# Patient Record
Sex: Female | Born: 1940
Health system: Southern US, Community
[De-identification: ages and names within clinical notes are randomized; demographics above are authoritative.]

## PROBLEM LIST (undated history)

## (undated) ENCOUNTER — Emergency Department (HOSPITAL_BASED_OUTPATIENT_CLINIC_OR_DEPARTMENT_OTHER): Admission: EM | Source: Home / Self Care

## (undated) DIAGNOSIS — I700 Atherosclerosis of aorta: Secondary | ICD-10-CM

## (undated) DIAGNOSIS — Z973 Presence of spectacles and contact lenses: Secondary | ICD-10-CM

## (undated) DIAGNOSIS — R350 Frequency of micturition: Secondary | ICD-10-CM

## (undated) DIAGNOSIS — R17 Unspecified jaundice: Secondary | ICD-10-CM

## (undated) DIAGNOSIS — C50112 Malignant neoplasm of central portion of left female breast: Secondary | ICD-10-CM

## (undated) DIAGNOSIS — E119 Type 2 diabetes mellitus without complications: Secondary | ICD-10-CM

## (undated) DIAGNOSIS — R011 Cardiac murmur, unspecified: Secondary | ICD-10-CM

## (undated) DIAGNOSIS — R531 Weakness: Secondary | ICD-10-CM

## (undated) DIAGNOSIS — Z5189 Encounter for other specified aftercare: Secondary | ICD-10-CM

## (undated) DIAGNOSIS — Z8679 Personal history of other diseases of the circulatory system: Secondary | ICD-10-CM

## (undated) DIAGNOSIS — Z170 Estrogen receptor positive status [ER+]: Secondary | ICD-10-CM

## (undated) DIAGNOSIS — Z972 Presence of dental prosthetic device (complete) (partial): Secondary | ICD-10-CM

## (undated) DIAGNOSIS — M8580 Other specified disorders of bone density and structure, unspecified site: Secondary | ICD-10-CM

## (undated) DIAGNOSIS — Z9081 Acquired absence of spleen: Secondary | ICD-10-CM

## (undated) DIAGNOSIS — N183 Chronic kidney disease, stage 3 unspecified: Secondary | ICD-10-CM

## (undated) DIAGNOSIS — Z923 Personal history of irradiation: Secondary | ICD-10-CM

## (undated) DIAGNOSIS — I519 Heart disease, unspecified: Secondary | ICD-10-CM

## (undated) DIAGNOSIS — R319 Hematuria, unspecified: Secondary | ICD-10-CM

## (undated) DIAGNOSIS — Z800 Family history of malignant neoplasm of digestive organs: Secondary | ICD-10-CM

## (undated) DIAGNOSIS — H269 Unspecified cataract: Secondary | ICD-10-CM

## (undated) DIAGNOSIS — I671 Cerebral aneurysm, nonruptured: Secondary | ICD-10-CM

## (undated) DIAGNOSIS — Z974 Presence of external hearing-aid: Secondary | ICD-10-CM

## (undated) DIAGNOSIS — D494 Neoplasm of unspecified behavior of bladder: Secondary | ICD-10-CM

## (undated) DIAGNOSIS — I639 Cerebral infarction, unspecified: Secondary | ICD-10-CM

## (undated) DIAGNOSIS — R300 Dysuria: Secondary | ICD-10-CM

## (undated) DIAGNOSIS — C189 Malignant neoplasm of colon, unspecified: Secondary | ICD-10-CM

## (undated) DIAGNOSIS — K219 Gastro-esophageal reflux disease without esophagitis: Secondary | ICD-10-CM

## (undated) DIAGNOSIS — R310 Gross hematuria: Secondary | ICD-10-CM

## (undated) DIAGNOSIS — E785 Hyperlipidemia, unspecified: Secondary | ICD-10-CM

## (undated) DIAGNOSIS — Z803 Family history of malignant neoplasm of breast: Secondary | ICD-10-CM

## (undated) DIAGNOSIS — C50919 Malignant neoplasm of unspecified site of unspecified female breast: Secondary | ICD-10-CM

## (undated) DIAGNOSIS — Z85820 Personal history of malignant melanoma of skin: Secondary | ICD-10-CM

## (undated) DIAGNOSIS — Z8489 Family history of other specified conditions: Secondary | ICD-10-CM

## (undated) DIAGNOSIS — K5730 Diverticulosis of large intestine without perforation or abscess without bleeding: Secondary | ICD-10-CM

## (undated) DIAGNOSIS — D649 Anemia, unspecified: Secondary | ICD-10-CM

## (undated) DIAGNOSIS — Z8051 Family history of malignant neoplasm of kidney: Secondary | ICD-10-CM

## (undated) DIAGNOSIS — C679 Malignant neoplasm of bladder, unspecified: Secondary | ICD-10-CM

## (undated) DIAGNOSIS — T7840XA Allergy, unspecified, initial encounter: Secondary | ICD-10-CM

## (undated) DIAGNOSIS — Z8673 Personal history of transient ischemic attack (TIA), and cerebral infarction without residual deficits: Secondary | ICD-10-CM

## (undated) DIAGNOSIS — R0600 Dyspnea, unspecified: Secondary | ICD-10-CM

## (undated) DIAGNOSIS — M1990 Unspecified osteoarthritis, unspecified site: Secondary | ICD-10-CM

## (undated) DIAGNOSIS — I10 Essential (primary) hypertension: Secondary | ICD-10-CM

## (undated) DIAGNOSIS — R197 Diarrhea, unspecified: Secondary | ICD-10-CM

## (undated) DIAGNOSIS — Z8049 Family history of malignant neoplasm of other genital organs: Secondary | ICD-10-CM

## (undated) DIAGNOSIS — C439 Malignant melanoma of skin, unspecified: Secondary | ICD-10-CM

## (undated) DIAGNOSIS — R5383 Other fatigue: Secondary | ICD-10-CM

## (undated) HISTORY — PX: CHOLECYSTECTOMY OPEN: SUR202

## (undated) HISTORY — PX: FINGER SURGERY: SHX640

## (undated) HISTORY — PX: TONSILLECTOMY: SUR1361

## (undated) HISTORY — DX: Family history of malignant neoplasm of breast: Z80.3

## (undated) HISTORY — DX: Anemia, unspecified: D64.9

## (undated) HISTORY — DX: Allergy, unspecified, initial encounter: T78.40XA

## (undated) HISTORY — DX: Family history of malignant neoplasm of digestive organs: Z80.0

## (undated) HISTORY — DX: Cerebral infarction, unspecified: I63.9

## (undated) HISTORY — DX: Encounter for other specified aftercare: Z51.89

## (undated) HISTORY — DX: Family history of malignant neoplasm of other genital organs: Z80.49

## (undated) HISTORY — DX: Unspecified cataract: H26.9

## (undated) HISTORY — DX: Hyperlipidemia, unspecified: E78.5

## (undated) HISTORY — PX: SPLENECTOMY, TOTAL: SHX788

## (undated) HISTORY — DX: Family history of malignant neoplasm of kidney: Z80.51

---
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
## 2001-11-09 ENCOUNTER — Inpatient Hospital Stay (HOSPITAL_COMMUNITY): Admission: EM | Admit: 2001-11-09 | Discharge: 2001-11-10 | Payer: Self-pay | Admitting: Emergency Medicine

## 2005-02-12 ENCOUNTER — Inpatient Hospital Stay (HOSPITAL_COMMUNITY): Admission: EM | Admit: 2005-02-12 | Discharge: 2005-02-19 | Payer: Self-pay | Admitting: Emergency Medicine

## 2005-02-12 DIAGNOSIS — Z8679 Personal history of other diseases of the circulatory system: Secondary | ICD-10-CM

## 2005-02-12 HISTORY — DX: Personal history of other diseases of the circulatory system: Z86.79

## 2012-10-16 ENCOUNTER — Emergency Department (HOSPITAL_COMMUNITY)
Admission: EM | Admit: 2012-10-16 | Discharge: 2012-10-16 | Disposition: A | Discharge status: Home / Self Care | Payer: Medicare HMO | Attending: Emergency Medicine | Admitting: Emergency Medicine

## 2012-10-16 ENCOUNTER — Encounter (HOSPITAL_COMMUNITY): Payer: Self-pay | Admitting: Emergency Medicine

## 2012-10-16 ENCOUNTER — Emergency Department (HOSPITAL_COMMUNITY): Payer: Medicare HMO

## 2012-10-16 DIAGNOSIS — Z79899 Other long term (current) drug therapy: Secondary | ICD-10-CM | POA: Insufficient documentation

## 2012-10-16 DIAGNOSIS — Z8679 Personal history of other diseases of the circulatory system: Secondary | ICD-10-CM | POA: Insufficient documentation

## 2012-10-16 DIAGNOSIS — Y92009 Unspecified place in unspecified non-institutional (private) residence as the place of occurrence of the external cause: Secondary | ICD-10-CM | POA: Insufficient documentation

## 2012-10-16 DIAGNOSIS — I10 Essential (primary) hypertension: Secondary | ICD-10-CM | POA: Insufficient documentation

## 2012-10-16 DIAGNOSIS — IMO0002 Reserved for concepts with insufficient information to code with codable children: Secondary | ICD-10-CM | POA: Insufficient documentation

## 2012-10-16 DIAGNOSIS — Y939 Activity, unspecified: Secondary | ICD-10-CM | POA: Insufficient documentation

## 2012-10-16 DIAGNOSIS — M8580 Other specified disorders of bone density and structure, unspecified site: Secondary | ICD-10-CM

## 2012-10-16 DIAGNOSIS — Z7982 Long term (current) use of aspirin: Secondary | ICD-10-CM | POA: Insufficient documentation

## 2012-10-16 DIAGNOSIS — E119 Type 2 diabetes mellitus without complications: Secondary | ICD-10-CM | POA: Insufficient documentation

## 2012-10-16 DIAGNOSIS — X500XXA Overexertion from strenuous movement or load, initial encounter: Secondary | ICD-10-CM | POA: Insufficient documentation

## 2012-10-16 DIAGNOSIS — S76312A Strain of muscle, fascia and tendon of the posterior muscle group at thigh level, left thigh, initial encounter: Secondary | ICD-10-CM

## 2012-10-16 HISTORY — DX: Essential (primary) hypertension: I10

## 2012-10-16 HISTORY — DX: Other specified disorders of bone density and structure, unspecified site: M85.80

## 2012-10-16 HISTORY — DX: Cardiac murmur, unspecified: R01.1

## 2012-10-16 MED ORDER — IBUPROFEN 400 MG PO TABS: 400.0000 mg | ORAL_TABLET | Freq: Four times a day (QID) | ORAL | Status: DC | PRN

## 2012-10-16 NOTE — ED Provider Notes (Signed)
CSN: [id number]     Arrival date & time 10/16/12  1126 History   First MD Initiated Contact with Patient 10/16/12 1215     Chief Complaint  Patient presents with  . Knee Pain   (Consider location/radiation/quality/duration/timing/severity/associated sxs/prior Treatment) HPI Comments: Patient presents with posterior left knee pain that began acutely this morning when she stood up from her toilet, twisted her knee and felt a "pop". Pain was sharp. It is improved with nonweightbearing. It hurts when she puts weight on her heel. She denies numbness or tingling. No treatments prior to arrival. No hip pain. Course is constant.  Patient is a 72 y.o. female presenting with knee pain. The history is provided by the patient.  Knee Pain Associated symptoms: no back pain and no neck pain     Past Medical History  Diagnosis Date  . Hypertension   . Diabetes mellitus without complication   . Heart murmur    History reviewed. No pertinent past surgical history. History reviewed. No pertinent family history. History  Substance Use Topics  . Smoking status: Never Smoker   . Smokeless tobacco: Not on file  . Alcohol Use: No   OB History   Grav Para Term Preterm Abortions TAB SAB Ect Mult Living                 Review of Systems  Constitutional: Negative for activity change.  HENT: Negative for neck pain.   Musculoskeletal: Positive for arthralgias. Negative for back pain and joint swelling.  Skin: Negative for wound.  Neurological: Negative for weakness and numbness.    Allergies  Review of patient's allergies indicates no known allergies.  Home Medications   Current Outpatient Rx  Name  Route  Sig  Dispense  Refill  . aspirin 81 MG tablet   Oral   Take 81 mg by mouth daily.         [name] lisinopril (PRINIVIL,ZESTRIL) 20 MG tablet   Oral   Take 20 mg by mouth daily.         [name] ibuprofen (ADVIL,MOTRIN) 400 MG tablet   Oral   Take 1 tablet (400 mg total) by mouth every 6 (six)  hours as needed for pain.   30 tablet   0    BP 107/84  Pulse 78  Temp(Src) 97.5 F (36.4 C) (Oral)  Resp 18  SpO2 99% Physical Exam  Nursing note and vitals reviewed. Constitutional: She appears well-developed and well-nourished.  HENT:  Head: Normocephalic and atraumatic.  Eyes: Pupils are equal, round, and reactive to light.  Neck: Normal range of motion. Neck supple.  Cardiovascular: Exam reveals no decreased pulses.   Pulses:      Dorsalis pedis pulses are 2+ on the right side, and 2+ on the left side.       Posterior tibial pulses are 2+ on the right side, and 2+ on the left side.  Musculoskeletal: She exhibits tenderness. She exhibits no edema.       Left hip: Normal.       Left knee: She exhibits normal range of motion, no swelling, no effusion, no LCL laxity, normal patellar mobility and no MCL laxity. Tenderness found. No medial joint line, no lateral joint line and no patellar tendon tenderness noted.       Left upper leg: She exhibits tenderness and bony tenderness.       Legs:      Left foot: Normal.  Neurological: She is alert. No sensory deficit.  Motor, sensation, and vascular distal to the injury is fully intact.   Skin: Skin is warm and dry.  Psychiatric: She has a normal mood and affect.    ED Course  Procedures (including critical care time) Labs Review Labs Reviewed - No data to display Imaging Review Dg Knee Complete 4 Views Left  10/16/2012   *RADIOLOGY REPORT*  Clinical Data: Left knee posterior pain no known injury  LEFT KNEE - COMPLETE 4+ VIEW  Comparison:  none  Findings: Four views of the left knee submitted.  There is diffuse osteopenia.  No acute fracture or subluxation.  Narrowing of medial joint compartment.  Small to moderate joint effusion.  Narrowing of patellofemoral joint space.  Mild spurring of patella.  IMPRESSION: No acute fracture or subluxation.  Diffuse osteopenia. Degenerative changes as described above.   Original Report  Authenticated By: Natasha Mead, M.D.   Patient seen and examined. Work-up initiated.    Vital signs reviewed and are as follows: Filed Vitals:   10/16/12 1131  BP: 107/84  Pulse: 78  Temp: 97.5 F (36.4 C)  Resp: 18   X-ray reviewed by myself, discussed with Dr. Ruffin Frederick. Patient provided with knee sleeve.  Urged followup with orthopedist and take over-the-counter ibuprofen as needed for soreness. Patient refuses crutches.    MDM   1. Hamstring strain, left, initial encounter    Patient with posterior knee pain, most tender in area of distal hamstrings. Suspect hamstring strain/pull. X-rays negative. No apparent laxity on exam. Lower extremity is neurovascularly intact.  Renne Crigler, PA-C 10/16/12 [phone]

## 2012-10-16 NOTE — ED Notes (Signed)
Pt c/o left knee pain after feeling "pop" today; pt sts now having pain; pt ambulatory

## 2012-10-16 NOTE — ED Notes (Signed)
Pt states she stood from a sitting position and felt sudden "pop" and "a sharp dagger" in posterior left knee.

## 2012-10-18 NOTE — ED Provider Notes (Signed)
Medical screening examination/treatment/procedure(s) were performed by non-physician practitioner and as supervising physician I was immediately available for consultation/collaboration.   Dwyne Hasegawa David Derrick Tiegs, MD 10/18/12 1712 

## 2012-11-28 ENCOUNTER — Ambulatory Visit (INDEPENDENT_AMBULATORY_CARE_PROVIDER_SITE_OTHER): Payer: Medicare HMO | Admitting: Family Medicine

## 2012-11-28 VITALS — BP 142/90 | HR 82 | Temp 98.1°F | Resp 20 | Ht 64.0 in | Wt 197.2 lb

## 2012-11-28 DIAGNOSIS — M1710 Unilateral primary osteoarthritis, unspecified knee: Secondary | ICD-10-CM

## 2012-11-28 DIAGNOSIS — M25569 Pain in unspecified knee: Secondary | ICD-10-CM

## 2012-11-28 DIAGNOSIS — IMO0002 Reserved for concepts with insufficient information to code with codable children: Secondary | ICD-10-CM

## 2012-11-28 DIAGNOSIS — M25562 Pain in left knee: Secondary | ICD-10-CM

## 2012-11-28 NOTE — Patient Instructions (Signed)
If any increased pain, redness in knee, or fever - return here or emergency room for recheck as your knee was injected today.  Your knee pain appear to be due to either arthritis flare or degenerative meniscus tear, which is reason for trial of injection today. After 2 weeks of relative rest (range of motion and daily activities ok), and ace bandage as needed, recheck to discuss next step. If imporoving - may start some home exercises, if not, can refer you to orthopaedic doctor.  Return to the clinic or go to the nearest emergency room if any of your symptoms worsen or new symptoms occur. Knee Pain The knee is the complex joint between your thigh and your lower leg. It is made up of bones, tendons, ligaments, and cartilage. The bones that make up the knee are:  The femur in the thigh.  The tibia and fibula in the lower leg.  The patella or kneecap riding in the groove on the lower femur. CAUSES  Knee pain is a common complaint with many causes. A few of these causes are:  Injury, such as:  A ruptured ligament or tendon injury.  Torn cartilage.  Medical conditions, such as:  Gout  Arthritis  Infections  Overuse, over training or overdoing a physical activity. Knee pain can be minor or severe. Knee pain can accompany debilitating injury. Minor knee problems often respond well to self-care measures or get well on their own. More serious injuries may need medical intervention or even surgery. SYMPTOMS The knee is complex. Symptoms of knee problems can vary widely. Some of the problems are:  Pain with movement and weight bearing.  Swelling and tenderness.  Buckling of the knee.  Inability to straighten or extend your knee.  Your knee locks and you cannot straighten it.  Warmth and redness with pain and fever.  Deformity or dislocation of the kneecap. DIAGNOSIS  Determining what is wrong may be very straight forward such as when there is an injury. It can also be challenging  because of the complexity of the knee. Tests to make a diagnosis may include:  Your caregiver taking a history and doing a physical exam.  Routine X-rays can be used to rule out other problems. X-rays will not reveal a cartilage tear. Some injuries of the knee can be diagnosed by:  Arthroscopy a surgical technique by which a small video camera is inserted through tiny incisions on the sides of the knee. This procedure is used to examine and repair internal knee joint problems. Tiny instruments can be used during arthroscopy to repair the torn knee cartilage (meniscus).  Arthrography is a radiology technique. A contrast liquid is directly injected into the knee joint. Internal structures of the knee joint then become visible on X-ray film.  An MRI scan is a non x-ray radiology procedure in which magnetic fields and a computer produce two- or three-dimensional images of the inside of the knee. Cartilage tears are often visible using an MRI scanner. MRI scans have largely replaced arthrography in diagnosing cartilage tears of the knee.  Blood work.  Examination of the fluid that helps to lubricate the knee joint (synovial fluid). This is done by taking a sample out using a needle and a syringe. TREATMENT The treatment of knee problems depends on the cause. Some of these treatments are:  Depending on the injury, proper casting, splinting, surgery or physical therapy care will be needed.  Give yourself adequate recovery time. Do not overuse your joints. If you  begin to get sore during workout routines, back off. Slow down or do fewer repetitions.  For repetitive activities such as cycling or running, maintain your strength and nutrition.  Alternate muscle groups. For example if you are a weight lifter, work the upper body on one day and the lower body the next.  Either tight or weak muscles do not give the proper support for your knee. Tight or weak muscles do not absorb the stress placed on the  knee joint. Keep the muscles surrounding the knee strong.  Take care of mechanical problems.  If you have flat feet, orthotics or special shoes may help. See your caregiver if you need help.  Arch supports, sometimes with wedges on the inner or outer aspect of the heel, can help. These can shift pressure away from the side of the knee most bothered by osteoarthritis.  A brace called an "unloader" brace also may be used to help ease the pressure on the most arthritic side of the knee.  If your caregiver has prescribed crutches, braces, wraps or ice, use as directed. The acronym for this is PRICE. This means protection, rest, ice, compression and elevation.  Nonsteroidal anti-inflammatory drugs (NSAID's), can help relieve pain. But if taken immediately after an injury, they may actually increase swelling. Take NSAID's with food in your stomach. Stop them if you develop stomach problems. Do not take these if you have a history of ulcers, stomach pain or bleeding from the bowel. Do not take without your caregiver's approval if you have problems with fluid retention, heart failure, or kidney problems.  For ongoing knee problems, physical therapy may be helpful.  Glucosamine and chondroitin are over-the-counter dietary supplements. Both may help relieve the pain of osteoarthritis in the knee. These medicines are different from the usual anti-inflammatory drugs. Glucosamine may decrease the rate of cartilage destruction.  Injections of a corticosteroid drug into your knee joint may help reduce the symptoms of an arthritis flare-up. They may provide pain relief that lasts a few months. You may have to wait a few months between injections. The injections do have a small increased risk of infection, water retention and elevated blood sugar levels.  Hyaluronic acid injected into damaged joints may ease pain and provide lubrication. These injections may work by reducing inflammation. A series of shots may  give relief for as long as 6 months.  Topical painkillers. Applying certain ointments to your skin may help relieve the pain and stiffness of osteoarthritis. Ask your pharmacist for suggestions. Many over the-counter products are approved for temporary relief of arthritis pain.  In some countries, doctors often prescribe topical NSAID's for relief of chronic conditions such as arthritis and tendinitis. A review of treatment with NSAID creams found that they worked as well as oral medications but without the serious side effects. PREVENTION  Maintain a healthy weight. Extra pounds put more strain on your joints.  Get strong, stay limber. Weak muscles are a common cause of knee injuries. Stretching is important. Include flexibility exercises in your workouts.  Be smart about exercise. If you have osteoarthritis, chronic knee pain or recurring injuries, you may need to change the way you exercise. This does not mean you have to stop being active. If your knees ache after jogging or playing basketball, consider switching to swimming, water aerobics or other low-impact activities, at least for a few days a week. Sometimes limiting high-impact activities will provide relief.  Make sure your shoes fit well. Choose footwear that is  right for your sport.  Protect your knees. Use the proper gear for knee-sensitive activities. Use kneepads when playing volleyball or laying carpet. Buckle your seat belt every time you drive. Most shattered kneecaps occur in car accidents.  Rest when you are tired. SEEK MEDICAL CARE IF:  You have knee pain that is continual and does not seem to be getting better.  SEEK IMMEDIATE MEDICAL CARE IF:  Your knee joint feels hot to the touch and you have a high fever. MAKE SURE YOU:   Understand these instructions.  Will watch your condition.  Will get help right away if you are not doing well or get worse. Document Released: 11/21/2006 Document Revised: 04/18/2011 Document  Reviewed: 11/21/2006 [facility] Patient Information 2014 [place], [place].

## 2012-11-28 NOTE — Progress Notes (Addendum)
Subjective:    Patient ID: Briana Ochoa, female    DOB: [birth year], 72 y.o.   MRN: [id number]  This chart was scribed for Briana Staggers, MD by Greggory Stallion, ED Scribe. This patient's care was started at 3:14 PM.  HPI HPI Comments: Briana Ochoa is a 72 y.o. female seen 10/16/12 in the ED for left knee pain, acute onset that morning when she twisted her knee and felt a pop. Xray did not indicate acute fracture but did note osteopenia and degenerative changes. Suspects hamstring strain. Recommended treatment of OTC ibuprofen and follow up with ortho. Here for evaluation of left knee again. Pt states she did not see an orthopedist because her insurance wouldn't let her use the referral from the hospital. She states her knee started getting better so she never called. Her knee is still bothering her but now on the front of the knee. She does not use any assisting devices for ambulation but ambulation causes swelling in the knee. Pt denies calf pain, CP, SOB. She denies history of DVT. She takes ibuprofen as needed during the day but does take it every night before bed. Pt states her knee never buckles while she's bearing weight. She denies history of surgery or procedure on her knee.   There are no active problems to display for this patient.  Past Medical History  Diagnosis Date  . Hypertension   . Diabetes mellitus without complication   . Heart murmur    Past Surgical History  Procedure Laterality Date  . Cesarean section    . Cholecystectomy    . Splenectomy, total     No Known Allergies Prior to Admission medications   Medication Sig Start Date End Date Taking? Authorizing Provider  aspirin 81 MG tablet Take 81 mg by mouth daily.   Yes Historical Provider, MD  ibuprofen (ADVIL,MOTRIN) 400 MG tablet Take 1 tablet (400 mg total) by mouth every 6 (six) hours as needed for pain. 10/16/12  Yes Briana Crigler, PA-C  lisinopril (PRINIVIL,ZESTRIL) 20 MG tablet Take 20 mg by mouth  daily.   Yes Historical Provider, MD   History   Social History  . Marital Status: Married    Spouse Name: N/A    Number of Children: N/A  . Years of Education: N/A   Occupational History  . Not on file.   Social History Main Topics  . Smoking status: Never Smoker   . Smokeless tobacco: Not on file  . Alcohol Use: No  . Drug Use: No  . Sexual Activity: Not on file   Other Topics Concern  . Not on file   Social History Narrative  . No narrative on file   Review of Systems  Respiratory: Negative for shortness of breath.   Cardiovascular: Negative for chest pain.  Musculoskeletal: Positive for arthralgias and joint swelling. Negative for myalgias.      Objective:   Physical Exam  Constitutional: She is oriented to person, place, and time. She appears well-developed and well-nourished.  HENT:  Head: Normocephalic and atraumatic.  Eyes: EOM are normal.  Cardiovascular: Normal rate.   Pulmonary/Chest: Effort normal.  Musculoskeletal:  Left knee has full extension. Full flexion equal to right side. Patella and patella tendon are non tender. LCL and lateral joint line non tender. Slight fullness of popliteal fossa but non tender. Hamstrings non tender, no defect. MCL non tender, however is tender to palpation along medial joint line diffusely. No varus or valgus laxity. Negative anterior drawer. 2+  effusion of left knee. No apparent erythema or warmth. Pain with McMurray's test with internal rotation of lower leg and flexion.   Neurological: She is alert and oriented to person, place, and time.  Skin: Skin is warm and dry. No erythema.  Psychiatric: She has a normal mood and affect. Her behavior is normal.   Filed Vitals:   11/28/12 1448  BP: 142/90  Pulse: 82  Temp: 98.1 F (36.7 C)  TempSrc: Oral  Resp: 20  Height: 5\' 4"  (1.626 m)  Weight: 197 lb 3.2 oz (89.449 kg)  SpO2: 96%   Risks (including but not limited to bleeding and infection), benefits, and alternatives  discussed for L knee superolateral injection.  Verbal consent obtained after any questions were answered. Landmarks noted, and marked as needed. Area cleansed with Betadine x3, ethyl chloride spray for topical anesthesia, followed by alcohol swab. 40cc clear yellow fluid withdrawn with 20 ga, 1and 1/2 inch needle, followed by injection with 3cc lidocaine 1%plain, 1cc Kenalog 40mg /ml on same needle by hemostat.   No complications. Bandage applied.  RTC precautions discussed in regards to injection. Noticed less pain after injection.      Assessment & Plan:   [name] is a 72 y.o. female Left knee pain - Plan: CANCELED: DG Knee Complete 4 Views Left  OA (osteoarthritis) of knee  Suspected DJD flair vs. degenerative meniscal tear. tx options discussed - injected as above, ace bandage applied.  aftercare discussed. recheck in 2 weeks as below for possible HEP, or ortho if not improving.   No orders of the defined types were placed in this encounter.   Patient Instructions  If any increased pain, redness in knee, or fever - return here or emergency room for recheck as your knee was injected today.  Your knee pain appear to be due to either arthritis flare or degenerative meniscus tear, which is reason for trial of injection today. After 2 weeks of relative rest (range of motion and daily activities ok), and ace bandage as needed, recheck to discuss next step. If imporoving - may start some home exercises, if not, can refer you to orthopaedic doctor.  Return to the clinic or go to the nearest emergency room if any of your symptoms worsen or new symptoms occur. Knee Pain The knee is the complex joint between your thigh and your lower leg. It is made up of bones, tendons, ligaments, and cartilage. The bones that make up the knee are:  The femur in the thigh.  The tibia and fibula in the lower leg.  The patella or kneecap riding in the groove on the lower femur. CAUSES  Knee pain is a  common complaint with many causes. A few of these causes are:  Injury, such as:  A ruptured ligament or tendon injury.  Torn cartilage.  Medical conditions, such as:  Gout  Arthritis  Infections  Overuse, over training or overdoing a physical activity. Knee pain can be minor or severe. Knee pain can accompany debilitating injury. Minor knee problems often respond well to self-care measures or get well on their own. More serious injuries may need medical intervention or even surgery. SYMPTOMS The knee is complex. Symptoms of knee problems can vary widely. Some of the problems are:  Pain with movement and weight bearing.  Swelling and tenderness.  Buckling of the knee.  Inability to straighten or extend your knee.  Your knee locks and you cannot straighten it.  Warmth and redness with pain and  fever.  Deformity or dislocation of the kneecap. DIAGNOSIS  Determining what is wrong may be very straight forward such as when there is an injury. It can also be challenging because of the complexity of the knee. Tests to make a diagnosis may include:  Your caregiver taking a history and doing a physical exam.  Routine X-rays can be used to rule out other problems. X-rays will not reveal a cartilage tear. Some injuries of the knee can be diagnosed by:  Arthroscopy a surgical technique by which a small video camera is inserted through tiny incisions on the sides of the knee. This procedure is used to examine and repair internal knee joint problems. Tiny instruments can be used during arthroscopy to repair the torn knee cartilage (meniscus).  Arthrography is a radiology technique. A contrast liquid is directly injected into the knee joint. Internal structures of the knee joint then become visible on X-ray film.  An MRI scan is a non x-ray radiology procedure in which magnetic fields and a computer produce two- or three-dimensional images of the inside of the knee. Cartilage tears are  often visible using an MRI scanner. MRI scans have largely replaced arthrography in diagnosing cartilage tears of the knee.  Blood work.  Examination of the fluid that helps to lubricate the knee joint (synovial fluid). This is done by taking a sample out using a needle and a syringe. TREATMENT The treatment of knee problems depends on the cause. Some of these treatments are:  Depending on the injury, proper casting, splinting, surgery or physical therapy care will be needed.  Give yourself adequate recovery time. Do not overuse your joints. If you begin to get sore during workout routines, back off. Slow down or do fewer repetitions.  For repetitive activities such as cycling or running, maintain your strength and nutrition.  Alternate muscle groups. For example if you are a weight lifter, work the upper body on one day and the lower body the next.  Either tight or weak muscles do not give the proper support for your knee. Tight or weak muscles do not absorb the stress placed on the knee joint. Keep the muscles surrounding the knee strong.  Take care of mechanical problems.  If you have flat feet, orthotics or special shoes may help. See your caregiver if you need help.  Arch supports, sometimes with wedges on the inner or outer aspect of the heel, can help. These can shift pressure away from the side of the knee most bothered by osteoarthritis.  A brace called an "unloader" brace also may be used to help ease the pressure on the most arthritic side of the knee.  If your caregiver has prescribed crutches, braces, wraps or ice, use as directed. The acronym for this is PRICE. This means protection, rest, ice, compression and elevation.  Nonsteroidal anti-inflammatory drugs (NSAID's), can help relieve pain. But if taken immediately after an injury, they may actually increase swelling. Take NSAID's with food in your stomach. Stop them if you develop stomach problems. Do not take these if you  have a history of ulcers, stomach pain or bleeding from the bowel. Do not take without your caregiver's approval if you have problems with fluid retention, heart failure, or kidney problems.  For ongoing knee problems, physical therapy may be helpful.  Glucosamine and chondroitin are over-the-counter dietary supplements. Both may help relieve the pain of osteoarthritis in the knee. These medicines are different from the usual anti-inflammatory drugs. Glucosamine may decrease the rate  of cartilage destruction.  Injections of a corticosteroid drug into your knee joint may help reduce the symptoms of an arthritis flare-up. They may provide pain relief that lasts a few months. You may have to wait a few months between injections. The injections do have a small increased risk of infection, water retention and elevated blood sugar levels.  Hyaluronic acid injected into damaged joints may ease pain and provide lubrication. These injections may work by reducing inflammation. A series of shots may give relief for as long as 6 months.  Topical painkillers. Applying certain ointments to your skin may help relieve the pain and stiffness of osteoarthritis. Ask your pharmacist for suggestions. Many over the-counter products are approved for temporary relief of arthritis pain.  In some countries, doctors often prescribe topical NSAID's for relief of chronic conditions such as arthritis and tendinitis. A review of treatment with NSAID creams found that they worked as well as oral medications but without the serious side effects. PREVENTION  Maintain a healthy weight. Extra pounds put more strain on your joints.  Get strong, stay limber. Weak muscles are a common cause of knee injuries. Stretching is important. Include flexibility exercises in your workouts.  Be smart about exercise. If you have osteoarthritis, chronic knee pain or recurring injuries, you may need to change the way you exercise. This does not mean  you have to stop being active. If your knees ache after jogging or playing basketball, consider switching to swimming, water aerobics or other low-impact activities, at least for a few days a week. Sometimes limiting high-impact activities will provide relief.  Make sure your shoes fit well. Choose footwear that is right for your sport.  Protect your knees. Use the proper gear for knee-sensitive activities. Use kneepads when playing volleyball or laying carpet. Buckle your seat belt every time you drive. Most shattered kneecaps occur in car accidents.  Rest when you are tired. SEEK MEDICAL CARE IF:  You have knee pain that is continual and does not seem to be getting better.  SEEK IMMEDIATE MEDICAL CARE IF:  Your knee joint feels hot to the touch and you have a high fever. MAKE SURE YOU:   Understand these instructions.  Will watch your condition.  Will get help right away if you are not doing well or get worse. Document Released: 11/21/2006 Document Revised: 04/18/2011 Document Reviewed: 11/21/2006 [facility] Patient Information 2014 [place], [place].     I personally performed the services described in this documentation, which was scribed in my presence. The recorded information has been reviewed and considered, and addended by me as needed.

## 2013-02-07 DIAGNOSIS — Z923 Personal history of irradiation: Secondary | ICD-10-CM

## 2013-02-07 DIAGNOSIS — C50919 Malignant neoplasm of unspecified site of unspecified female breast: Secondary | ICD-10-CM

## 2013-02-07 HISTORY — DX: Malignant neoplasm of unspecified site of unspecified female breast: C50.919

## 2013-02-07 HISTORY — DX: Personal history of irradiation: Z92.3

## 2013-02-07 HISTORY — PX: BREAST LUMPECTOMY: SHX2

## 2013-02-22 ENCOUNTER — Ambulatory Visit (INDEPENDENT_AMBULATORY_CARE_PROVIDER_SITE_OTHER): Payer: Medicare HMO | Admitting: Family Medicine

## 2013-02-22 VITALS — BP 110/80 | HR 84 | Temp 98.7°F | Resp 16 | Ht 64.0 in | Wt 198.0 lb

## 2013-02-22 DIAGNOSIS — Z23 Encounter for immunization: Secondary | ICD-10-CM

## 2013-02-22 DIAGNOSIS — M1710 Unilateral primary osteoarthritis, unspecified knee: Secondary | ICD-10-CM

## 2013-02-22 DIAGNOSIS — M179 Osteoarthritis of knee, unspecified: Secondary | ICD-10-CM

## 2013-02-22 DIAGNOSIS — IMO0002 Reserved for concepts with insufficient information to code with codable children: Secondary | ICD-10-CM

## 2013-02-22 DIAGNOSIS — E119 Type 2 diabetes mellitus without complications: Secondary | ICD-10-CM

## 2013-02-22 LAB — POCT GLYCOSYLATED HEMOGLOBIN (HGB A1C): Hemoglobin A1C: 5.9

## 2013-02-22 MED ORDER — DICLOFENAC SODIUM 75 MG PO TBEC: 75.0000 mg | DELAYED_RELEASE_TABLET | Freq: Two times a day (BID) | ORAL | Status: DC

## 2013-02-22 NOTE — Progress Notes (Signed)
Subjective: Patient is here for several things.  She needs a flu shot  She did better after the injection in her knee for about a month, but over the last month or so which continues to bother her more and more. She can get any exercise because of it. She takes a couple of ibuprofen push twice a day when needed for the pain. It swells some. If she is on it a lot it gives her trouble.  She has a history of having had some elevation of her sugars and a hemoglobin A1c of 7.0 times. I told her we should check that before orthopedist does anything with her  Objective: Minimal effusion left knee. No crepitance. There pain is primarily medially, which is the portion of the joint that showed most arthritis on x-ray.  Results for orders placed in visit on 02/22/13  POCT GLYCOSYLATED HEMOGLOBIN (HGB A1C)      Result Value Range   Hemoglobin A1C 5.9       Assessment: DJD of knee with chronic pain  Plan: Refer to or so Check hemoglobin A1c because of the diabetic possibility from her history of A1c's of 7.0.  Flu shot Diclofenac one twice daily

## 2013-02-22 NOTE — Patient Instructions (Signed)
Take the diclofenac one twice daily  Referral is being made to an orthopedic doctor. You should hear from that referral sometime before mid week. If you do not hear about it being made, please call and asked to speak the referrals desk at this office.  Flu shot today

## 2013-03-10 ENCOUNTER — Emergency Department (HOSPITAL_COMMUNITY): Payer: Medicare HMO

## 2013-03-10 ENCOUNTER — Ambulatory Visit (INDEPENDENT_AMBULATORY_CARE_PROVIDER_SITE_OTHER): Payer: Medicare HMO | Admitting: Emergency Medicine

## 2013-03-10 ENCOUNTER — Emergency Department (HOSPITAL_COMMUNITY)
Admission: EM | Admit: 2013-03-10 | Discharge: 2013-03-10 | Disposition: A | Discharge status: Home / Self Care | Payer: Medicare HMO | Attending: Emergency Medicine | Admitting: Emergency Medicine

## 2013-03-10 ENCOUNTER — Encounter (HOSPITAL_COMMUNITY): Payer: Self-pay | Admitting: Emergency Medicine

## 2013-03-10 ENCOUNTER — Ambulatory Visit: Payer: Medicare HMO

## 2013-03-10 VITALS — BP 132/100 | HR 116 | Temp 98.7°F | Resp 16 | Ht 64.0 in | Wt 195.0 lb

## 2013-03-10 DIAGNOSIS — R109 Unspecified abdominal pain: Secondary | ICD-10-CM

## 2013-03-10 DIAGNOSIS — R011 Cardiac murmur, unspecified: Secondary | ICD-10-CM | POA: Insufficient documentation

## 2013-03-10 DIAGNOSIS — M1710 Unilateral primary osteoarthritis, unspecified knee: Secondary | ICD-10-CM | POA: Insufficient documentation

## 2013-03-10 DIAGNOSIS — E119 Type 2 diabetes mellitus without complications: Secondary | ICD-10-CM | POA: Insufficient documentation

## 2013-03-10 DIAGNOSIS — I10 Essential (primary) hypertension: Secondary | ICD-10-CM | POA: Insufficient documentation

## 2013-03-10 DIAGNOSIS — Z79899 Other long term (current) drug therapy: Secondary | ICD-10-CM | POA: Insufficient documentation

## 2013-03-10 DIAGNOSIS — K5900 Constipation, unspecified: Secondary | ICD-10-CM | POA: Insufficient documentation

## 2013-03-10 DIAGNOSIS — Z9089 Acquired absence of other organs: Secondary | ICD-10-CM | POA: Insufficient documentation

## 2013-03-10 DIAGNOSIS — Z87891 Personal history of nicotine dependence: Secondary | ICD-10-CM | POA: Insufficient documentation

## 2013-03-10 DIAGNOSIS — Z791 Long term (current) use of non-steroidal anti-inflammatories (NSAID): Secondary | ICD-10-CM | POA: Insufficient documentation

## 2013-03-10 DIAGNOSIS — IMO0002 Reserved for concepts with insufficient information to code with codable children: Secondary | ICD-10-CM

## 2013-03-10 DIAGNOSIS — K921 Melena: Secondary | ICD-10-CM

## 2013-03-10 DIAGNOSIS — Z7982 Long term (current) use of aspirin: Secondary | ICD-10-CM | POA: Insufficient documentation

## 2013-03-10 LAB — POCT CBC
Granulocyte percent: 74.4 %G (ref 37–80)
HCT, POC: 55.3 % — AB (ref 37.7–47.9)
Hemoglobin: 17.8 g/dL — AB (ref 12.2–16.2)
Lymph, poc: 2.8 (ref 0.6–3.4)
MCH, POC: 30.9 pg (ref 27–31.2)
MCHC: 32.2 g/dL (ref 31.8–35.4)
MCV: 96 fL (ref 80–97)
MID (cbc): 1.3 — AB (ref 0–0.9)
MPV: 11.1 fL (ref 0–99.8)
POC Granulocyte: 12 — AB (ref 2–6.9)
POC LYMPH PERCENT: 17.6 %L (ref 10–50)
POC MID %: 8 %M (ref 0–12)
Platelet Count, POC: 311 10*3/uL (ref 142–424)
RBC: 5.76 M/uL — AB (ref 4.04–5.48)
RDW, POC: 14.2 %
WBC: 16.1 10*3/uL — AB (ref 4.6–10.2)

## 2013-03-10 LAB — URINALYSIS, ROUTINE W REFLEX MICROSCOPIC
Bilirubin Urine: NEGATIVE
Glucose, UA: NEGATIVE mg/dL
Hgb urine dipstick: NEGATIVE
Ketones, ur: NEGATIVE mg/dL
Nitrite: NEGATIVE
Protein, ur: NEGATIVE mg/dL
Specific Gravity, Urine: 1.012 (ref 1.005–1.030)
Urobilinogen, UA: 0.2 mg/dL (ref 0.0–1.0)
pH: 5.5 (ref 5.0–8.0)

## 2013-03-10 LAB — COMPREHENSIVE METABOLIC PANEL
ALT: 12 U/L (ref 0–35)
AST: 15 U/L (ref 0–37)
Albumin: 3.9 g/dL (ref 3.5–5.2)
Alkaline Phosphatase: 87 U/L (ref 39–117)
BUN: 18 mg/dL (ref 6–23)
CO2: 23 mEq/L (ref 19–32)
Calcium: 10 mg/dL (ref 8.4–10.5)
Chloride: 101 mEq/L (ref 96–112)
Creatinine, Ser: 1.33 mg/dL — ABNORMAL HIGH (ref 0.50–1.10)
GFR calc Af Amer: 45 mL/min — ABNORMAL LOW (ref >90)
GFR calc non Af Amer: 39 mL/min — ABNORMAL LOW (ref >90)
Glucose, Bld: 122 mg/dL — ABNORMAL HIGH (ref 70–99)
Potassium: 4.3 mEq/L (ref 3.7–5.3)
Sodium: 139 mEq/L (ref 137–147)
Total Bilirubin: 0.3 mg/dL (ref 0.3–1.2)
Total Protein: 7.6 g/dL (ref 6.0–8.3)

## 2013-03-10 LAB — LIPASE, BLOOD: Lipase: 25 U/L (ref 11–59)

## 2013-03-10 LAB — URINE MICROSCOPIC-ADD ON

## 2013-03-10 MED ORDER — SODIUM CHLORIDE 0.9 % IV BOLUS (SEPSIS)
1000.0000 mL | Freq: Once | INTRAVENOUS | Status: AC
  Administered 2013-03-10: 1000 mL via INTRAVENOUS

## 2013-03-10 MED ORDER — IOHEXOL 300 MG/ML  SOLN
100.0000 mL | Freq: Once | INTRAMUSCULAR | Status: AC | PRN
  Administered 2013-03-10: 100 mL via INTRAVENOUS

## 2013-03-10 MED ORDER — IOHEXOL 300 MG/ML  SOLN
50.0000 mL | Freq: Once | INTRAMUSCULAR | Status: AC | PRN
  Administered 2013-03-10: 50 mL via ORAL

## 2013-03-10 NOTE — ED Provider Notes (Signed)
TIME SEEN: 4:24 PM  CHIEF COMPLAINT: Abdominal pain, no bowel movement in 4 days  HPI: Patient is a 73 y.o. female with a history of hypertension, diabetes who presents the emergency department with diffuse abdominal pain and not having a bowel movement for 4 days. She reports she was seen a prominent allergic care today and was told she had a fecal impaction and was disimpacted. She had CBC which showed a leukocytosis of 16.1. Her abdominal x-ray showed no constipation, free air or obstruction. She reports she has had anorexia for 2 days and chills yesterday. She denies any nausea or vomiting. She reports she is not passing gas. She has had prior history of C-section, cholecystectomy and splenectomy. No prior history of bowel obstruction. Patient is off of her abdomen is more distended than normal. She recently started taking a new anti-inflammatory for her knee pain but is not on narcotics.  ROS: See HPI Constitutional: no fever  Eyes: no drainage  ENT: no runny nose   Cardiovascular:  no chest pain  Resp: no SOB  GI: no vomiting GU: no dysuria Integumentary: no rash  Allergy: no hives  Musculoskeletal: no leg swelling  Neurological: no slurred speech ROS otherwise negative  PAST MEDICAL HISTORY/PAST SURGICAL HISTORY:  Past Medical History  Diagnosis Date  . Hypertension   . Diabetes mellitus without complication   . Heart murmur   . Arthritis   . Arthritis of knee     MEDICATIONS:  Prior to Admission medications   Medication Sig Start Date End Date Taking? Authorizing Provider  aspirin 81 MG tablet Take 81 mg by mouth daily.   Yes Historical Provider, MD  diclofenac (VOLTAREN) 75 MG EC tablet Take 1 tablet (75 mg total) by mouth 2 (two) times daily. 02/22/13  Yes Posey Boyer, MD  docusate sodium (COLACE) 100 MG capsule Take 100 mg by mouth daily as needed for mild constipation.   Yes Historical Provider, MD  ibuprofen (ADVIL,MOTRIN) 200 MG tablet Take 400 mg by mouth every 6  (six) hours as needed for moderate pain.   Yes Historical Provider, MD  lisinopril (PRINIVIL,ZESTRIL) 20 MG tablet Take 20 mg by mouth daily.   Yes Historical Provider, MD    ALLERGIES:  No Known Allergies  SOCIAL HISTORY:  History  Substance Use Topics  . Smoking status: Former Research scientist (life sciences)  . Smokeless tobacco: Not on file  . Alcohol Use: No    FAMILY HISTORY: Family History  Problem Relation Age of Onset  . Cancer Mother     kidney  . Heart attack Paternal Grandfather     EXAM: BP 159/90  Pulse 106  Temp(Src) 97.7 F (36.5 C) (Oral)  Resp 16  SpO2 99% CONSTITUTIONAL: Alert and oriented and responds appropriately to questions. Well-appearing; well-nourished HEAD: Normocephalic EYES: Conjunctivae clear, PERRL ENT: normal nose; no rhinorrhea; moist mucous membranes; pharynx without lesions noted NECK: Supple, no meningismus, no LAD  CARD: RRR; S1 and S2 appreciated; no murmurs, no clicks, no rubs, no gallops RESP: Normal chest excursion without splinting or tachypnea; breath sounds clear and equal bilaterally; no wheezes, no rhonchi, no rales,  ABD/GI: Normal bowel sounds; non-distended; soft, abdomen is not tympanitic, no rebound or guarding, diffuse tenderness to palpation, no peritoneal signs BACK:  The back appears normal and is non-tender to palpation, there is no CVA tenderness EXT: Normal ROM in all joints; non-tender to palpation; no edema; normal capillary refill; no cyanosis    SKIN: Normal color for age and race;  warm NEURO: Moves all extremities equally PSYCH: The patient's mood and manner are appropriate. Grooming and personal hygiene are appropriate.  MEDICAL DECISION MAKING: Patient here with diffuse abdominal pain. She is tachycardic and has a leukocytosis of 16.  Patient is afebrile and normotensive. She denies wanting pain or nausea medicine at this time. Will check CMP and CT abdomen pelvis to evaluate for possible infectious etiology versus obstruction. Her  urine shows trace leukocytes but no other sign of infection. Patient and daughter agree with this plan.  ED PROGRESS: Patient CT scan shows no acute abnormality. Her CMP is negative. Discussed with patient and family that her pain and leukocytosis may be due to constipation. She also has mild elevation of her creatinine but it is unclear if this is baseline. She has received a liter of IV fluids in the emergency department. Have discussed with patient she will need to have her creatinine rechecked by her primary care physician. Given strict return precautions. Do not feel there is any life-threatening illness or surgical process at this time. Feel she is safe to be discharged. Patient and daughter at bedside verbalize understanding and are comfortable plan.     [place], DO 03/10/13 2343

## 2013-03-10 NOTE — ED Notes (Signed)
Pt a+ox4, presents following visit to urgent care with c/o lower abd pain and constipation x4 days.  Pt reports last BM on Weds was normal, since then approx 3/10 intermittent abd "discomfort" and also feeling rectal pressure.  Pt reports was disimpacted by MD at urgent care today.  Pt denies nausea, vomiting.  Pt reports decreased appetite x2 days.  abd s/nt/nd.  Pt denies fevers/chills, skin pwd.  Pt denies hx of similar.  Reports started new pain medication for chronic knee pain last week.  MAEI, amb with steady gait.  NAD.

## 2013-03-10 NOTE — Progress Notes (Addendum)
Subjective:    Patient ID: Briana Ochoa, female    DOB: 06/18/1940, 73 y.o.   MRN: [id number]  HPI Chief Complaint  Patient presents with   issuse with bowels    x4 days    This chart was scribed for Arlyss Queen, MD by Thea Alken, ED Scribe. This patient was seen in room 4 and the patient's care was started at 12:37 PM.  HPI Comments: Briana Ochoa is a 73 y.o. female who presents to the [facility] complaining of bowel problems onset 4 days. Pt reports that she feels like her bowels are stuck and reports that she "went digging for them". Pt states that when she did this there was blood. Pt reports that she feels constipated. She reports that her last normal BM was 4 days ago. She states there has been no blood on her underwear. She reports that she has tried to use a suppository but reports that is just fell out. Pt states that she has never had a colonoscopy and that she is afraid to get one. She denies h/o similar problems. Pt reports that she has had her gallbladder removed.   Past Medical History  Diagnosis Date   Hypertension    Diabetes mellitus without complication    Heart murmur    No Known Allergies Prior to Admission medications   Medication Sig Start Date End Date Taking? Authorizing Provider  aspirin 81 MG tablet Take 81 mg by mouth daily.   Yes Historical Provider, MD  diclofenac (VOLTAREN) 75 MG EC tablet Take 1 tablet (75 mg total) by mouth 2 (two) times daily. 02/22/13  Yes Posey Boyer, MD  lisinopril (PRINIVIL,ZESTRIL) 20 MG tablet Take 20 mg by mouth daily.   Yes Historical Provider, MD  ibuprofen (ADVIL,MOTRIN) 400 MG tablet Take 1 tablet (400 mg total) by mouth every 6 (six) hours as needed for pain. 10/16/12   Carlisle Cater, PA-C   Review of Systems  Gastrointestinal: Positive for constipation and blood in stool. Negative for diarrhea and anal bleeding.  Genitourinary: Negative for difficulty urinating.       Objective:     Physical Exam Filed Vitals:   03/10/13 1234  BP: 132/100  Pulse: 116  Temp: 98.7 F (37.1 C)  TempSrc: Oral  Resp: 16  Height: 5\' 4"  (1.626 m)  Weight: 195 lb (88.451 kg)  SpO2: 95%   CONSTITUTIONAL: Well developed/well nourished HEAD: Normocephalic/atraumatic EYES: EOMI/PERRL ENMT: Mucous membranes moist NECK: supple no meningeal signs SPINE:entire spine nontender CV: S1/S2 noted, no murmurs/rubs/gallops noted LUNGS: Lungs are clear to auscultation bilaterally, no apparent distress ABDOMEN: soft, nontender, no rebound or guarding. Examination of the rectum reveals the rectum be filled with hard stool. A good amount of the stool was broken up digitally and removed by extraction after thorough lubrication with K-Y jelly. Patient tolerated the procedure moderately well. GU:no cva tenderness NEURO: Pt is awake/alert, moves all extremitiesx4 EXTREMITIES: pulses normal, full ROM SKIN: warm, color normal PSYCH: no abnormalities of mood noted [facility] reading (PRIMARY) by  Dr. Everlene Farrier there is a significant amount of stool but no evidence of obstruction Results for orders placed in visit on 03/10/13  POCT CBC      Result Value Range   WBC 16.1 (*) 4.6 - 10.2 K/uL   Lymph, poc 2.8  0.6 - 3.4   POC LYMPH PERCENT 17.6  10 - 50 %L   MID (cbc) 1.3 (*) 0 - 0.9   POC  MID % 8.0  0 - 12 %M   POC Granulocyte 12.0 (*) 2 - 6.9   Granulocyte percent 74.4  37 - 80 %G   RBC 5.76 (*) 4.04 - 5.48 M/uL   Hemoglobin 17.8 (*) 12.2 - 16.2 g/dL   HCT, POC 55.3 (*) 37.7 - 47.9 %   MCV 96.0  80 - 97 fL   MCH, POC 30.9  27 - 31.2 pg   MCHC 32.2  31.8 - 35.4 g/dL   RDW, POC 14.2     Platelet Count, POC 311  142 - 424 K/uL   MPV 11.1  0 - 99.8 fL       Assessment & Plan:    Referral placed to gastroenterology for their help. I do not see any acute changes on her acute abdominal series per her white count is elevated at 16,000 and she will need further evaluation of this by CT. She also had significant  elevation in her hemoglobin to 17.8 etiology unknown. I personally performed the history and do the physical exam   .I personally performed the services described in this documentation, which was scribed in my presence. The recorded information has been reviewed and is accurate.

## 2013-03-10 NOTE — Patient Instructions (Signed)
I have spoken to the triage nurse Cisco. Please go to [facility] to have further evaluation of your abdominal discomfort, constipation, and elevated white count.

## 2013-03-10 NOTE — Discharge Instructions (Signed)
Abdominal Pain, Women °Abdominal (stomach, pelvic, or belly) pain can be caused by many things. It is important to tell your doctor: °· The location of the pain. °· Does it come and go or is it present all the time? °· Are there things that start the pain (eating certain foods, exercise)? °· Are there other symptoms associated with the pain (fever, nausea, vomiting, diarrhea)? °All of this is helpful to know when trying to find the cause of the pain. °CAUSES  °· Stomach: virus or bacteria infection, or ulcer. °· Intestine: appendicitis (inflamed appendix), regional ileitis (Crohn's disease), ulcerative colitis (inflamed colon), irritable bowel syndrome, diverticulitis (inflamed diverticulum of the colon), or cancer of the stomach or intestine. °· Gallbladder disease or stones in the gallbladder. °· Kidney disease, kidney stones, or infection. °· Pancreas infection or cancer. °· Fibromyalgia (pain disorder). °· Diseases of the female organs: °· Uterus: fibroid (non-cancerous) tumors or infection. °· Fallopian tubes: infection or tubal pregnancy. °· Ovary: cysts or tumors. °· Pelvic adhesions (scar tissue). °· Endometriosis (uterus lining tissue growing in the pelvis and on the pelvic organs). °· Pelvic congestion syndrome (female organs filling up with blood just before the menstrual period). °· Pain with the menstrual period. °· Pain with ovulation (producing an egg). °· Pain with an IUD (intrauterine device, birth control) in the uterus. °· Cancer of the female organs. °· Functional pain (pain not caused by a disease, may improve without treatment). °· Psychological pain. °· Depression. °DIAGNOSIS  °Your doctor will decide the seriousness of your pain by doing an examination. °· Blood tests. °· X-rays. °· Ultrasound. °· CT scan (computed tomography, special type of X-ray). °· MRI (magnetic resonance imaging). °· Cultures, for infection. °· Barium enema (dye inserted in the large intestine, to better view it with  X-rays). °· Colonoscopy (looking in intestine with a lighted tube). °· Laparoscopy (minor surgery, looking in abdomen with a lighted tube). °· Major abdominal exploratory surgery (looking in abdomen with a large incision). °TREATMENT  °The treatment will depend on the cause of the pain.  °· Many cases can be observed and treated at home. °· Over-the-counter medicines recommended by your caregiver. °· Prescription medicine. °· Antibiotics, for infection. °· Birth control pills, for painful periods or for ovulation pain. °· Hormone treatment, for endometriosis. °· Nerve blocking injections. °· Physical therapy. °· Antidepressants. °· Counseling with a psychologist or psychiatrist. °· Minor or major surgery. °HOME CARE INSTRUCTIONS  °· Do not take laxatives, unless directed by your caregiver. °· Take over-the-counter pain medicine only if ordered by your caregiver. Do not take aspirin because it can cause an upset stomach or bleeding. °· Try a clear liquid diet (broth or water) as ordered by your caregiver. Slowly move to a bland diet, as tolerated, if the pain is related to the stomach or intestine. °· Have a thermometer and take your temperature several times a day, and record it. °· Bed rest and sleep, if it helps the pain. °· Avoid sexual intercourse, if it causes pain. °· Avoid stressful situations. °· Keep your follow-up appointments and tests, as your caregiver orders. °· If the pain does not go away with medicine or surgery, you may try: °· Acupuncture. °· Relaxation exercises (yoga, meditation). °· Group therapy. °· Counseling. °SEEK MEDICAL CARE IF:  °· You notice certain foods cause stomach pain. °· Your home care treatment is not helping your pain. °· You need stronger pain medicine. °· You want your IUD removed. °· You feel faint or   lightheaded.  You develop nausea and vomiting.  You develop a rash.  You are having side effects or an allergy to your medicine. SEEK IMMEDIATE MEDICAL CARE IF:   Your  pain does not go away or gets worse.  You have a fever.  Your pain is felt only in portions of the abdomen. The right side could possibly be appendicitis. The left lower portion of the abdomen could be colitis or diverticulitis.  You are passing blood in your stools (bright red or black tarry stools, with or without vomiting).  You have blood in your urine.  You develop chills, with or without a fever.  You pass out. MAKE SURE YOU:   Understand these instructions.  Will watch your condition.  Will get help right away if you are not doing well or get worse. Document Released: 11/21/2006 Document Revised: 04/18/2011 Document Reviewed: 12/11/2008 [facility] Patient Information 2014 [place], [place].  Constipation, Adult Constipation is when a person has fewer than 3 bowel movements a week; has difficulty having a bowel movement; or has stools that are dry, hard, or larger than normal. As people grow older, constipation is more common. If you try to fix constipation with medicines that make you have a bowel movement (laxatives), the problem may get worse. Long-term laxative use may cause the muscles of the colon to become weak. A low-fiber diet, not taking in enough fluids, and taking certain medicines may make constipation worse. CAUSES   Certain medicines, such as antidepressants, pain medicine, iron supplements, antacids, and water pills.   Certain diseases, such as diabetes, irritable bowel syndrome (IBS), thyroid disease, or depression.   Not drinking enough water.   Not eating enough fiber-rich foods.   Stress or travel.  Lack of physical activity or exercise.  Not going to the restroom when there is the urge to have a bowel movement.  Ignoring the urge to have a bowel movement.  Using laxatives too much. SYMPTOMS   Having fewer than 3 bowel movements a week.   Straining to have a bowel movement.   Having hard, dry, or larger than normal stools.   Feeling  full or bloated.   Pain in the lower abdomen.  Not feeling relief after having a bowel movement. DIAGNOSIS  Your caregiver will take a medical history and perform a physical exam. Further testing may be done for severe constipation. Some tests may include:   A barium enema X-ray to examine your rectum, colon, and sometimes, your small intestine.  A sigmoidoscopy to examine your lower colon.  A colonoscopy to examine your entire colon. TREATMENT  Treatment will depend on the severity of your constipation and what is causing it. Some dietary treatments include drinking more fluids and eating more fiber-rich foods. Lifestyle treatments may include regular exercise. If these diet and lifestyle recommendations do not help, your caregiver may recommend taking over-the-counter laxative medicines to help you have bowel movements. Prescription medicines may be prescribed if over-the-counter medicines do not work.  HOME CARE INSTRUCTIONS   Increase dietary fiber in your diet, such as fruits, vegetables, whole grains, and beans. Limit high-fat and processed sugars in your diet, such as [place] fries, hamburgers, cookies, candies, and soda.   A fiber supplement may be added to your diet if you cannot get enough fiber from foods.   Drink enough fluids to keep your urine clear or pale yellow.   Exercise regularly or as directed by your caregiver.   Go to the restroom when you have  the urge to go. Do not hold it.  Only take medicines as directed by your caregiver. Do not take other medicines for constipation without talking to your caregiver first. [place] IF:   You have bright red blood in your stool.   Your constipation lasts for more than 4 days or gets worse.   You have abdominal or rectal pain.   You have thin, pencil-like stools.  You have unexplained weight loss. MAKE SURE YOU:   Understand these instructions.  Will watch your condition.  Will get help  right away if you are not doing well or get worse. Document Released: 10/23/2003 Document Revised: 04/18/2011 Document Reviewed: 11/05/2012 [facility] Patient Information 2014 [place], [place].  Fiber Content in Foods Drinking plenty of fluids and consuming foods high in fiber can help with constipation. See the list below for the fiber content of some common foods. Starches and Grains / Dietary Fiber (g)  Cheerios, 1 cup / 3 g  Kellogg's Corn Flakes, 1 cup / 0.7 g  Rice Krispies, 1  cup / 0.3 g  Quaker Oat Life Cereal,  cup / 2.1 g  Oatmeal, instant (cooked),  cup / 2 g  Kellogg's Frosted Mini Wheats, 1 cup / 5.1 g  Rice, brown, long-grain (cooked), 1 cup / 3.5 g  Rice, white, long-grain (cooked), 1 cup / 0.6 g  Macaroni, cooked, enriched, 1 cup / 2.5 g Legumes / Dietary Fiber (g)  Beans, baked, canned, plain or vegetarian,  cup / 5.2 g  Beans, kidney, canned,  cup / 6.8 g  Beans, pinto, dried (cooked),  cup / 7.7 g  Beans, pinto, canned,  cup / 5.5 g Breads and Crackers / Dietary Fiber (g)  Graham crackers, plain or honey, 2 squares / 0.7 g  Saltine crackers, 3 squares / 0.3 g  Pretzels, plain, salted, 10 pieces / 1.8 g  Bread, whole-wheat, 1 slice / 1.9 g  Bread, white, 1 slice / 0.7 g  Bread, raisin, 1 slice / 1.2 g  Bagel, plain, 3 oz / 2 g  Tortilla, flour, 1 oz / 0.9 g  Tortilla, corn, 1 small / 1.5 g  Bun, hamburger or hotdog, 1 small / 0.9 g Fruits / Dietary Fiber (g)  Apple, raw with skin, 1 medium / 4.4 g  Applesauce, sweetened,  cup / 1.5 g  Banana,  medium / 1.5 g  Grapes, 10 grapes / 0.4 g  Orange, 1 small / 2.3 g  Raisin, 1.5 oz / 1.6 g  Melon, 1 cup / 1.4 g Vegetables / Dietary Fiber (g)  Green beans, canned,  cup / 1.3 g  Carrots (cooked),  cup / 2.3 g  Broccoli (cooked),  cup / 2.8 g  Peas, frozen (cooked),  cup / 4.4 g  Potatoes, mashed,  cup / 1.6 g  Lettuce, 1 cup / 0.5 g  Corn, canned,  cup / 1.6  g  Tomato,  cup / 1.1 g Document Released: 06/12/2006 Document Revised: 04/18/2011 Document Reviewed: 08/07/2006 [employer] Patient Information 2014 [place], [place].    Your labs show an elevated white blood cell count of 16 with no obvious sign of infection is found on your exam. Your creatinine, kidney function, slightly elevated at 1.33 but we have no old for comparison. This may be due to mild dehydration. Recommend that you drink plenty of fluids and schedule an appointment with your Dr. to have your kidney function rechecked in one week. If you develop worsening pain,  vomiting and cannot keep down fluids, black tarry stools are large amount of blood in your stool, any other symptoms concerning to you, please return to the emergency department. Your CT scan showed no abnormality, obstruction.

## 2013-03-15 ENCOUNTER — Encounter: Payer: Self-pay | Admitting: General Practice

## 2013-04-16 ENCOUNTER — Encounter: Payer: Self-pay | Admitting: Family Medicine

## 2013-04-16 ENCOUNTER — Telehealth: Payer: Self-pay | Admitting: Family Medicine

## 2013-04-16 ENCOUNTER — Ambulatory Visit (INDEPENDENT_AMBULATORY_CARE_PROVIDER_SITE_OTHER): Payer: Medicare HMO | Admitting: Family Medicine

## 2013-04-16 ENCOUNTER — Other Ambulatory Visit: Payer: Self-pay | Admitting: Family Medicine

## 2013-04-16 VITALS — BP 126/80 | HR 75 | Temp 98.4°F | Ht 63.5 in | Wt 198.5 lb

## 2013-04-16 DIAGNOSIS — E119 Type 2 diabetes mellitus without complications: Secondary | ICD-10-CM

## 2013-04-16 DIAGNOSIS — Z136 Encounter for screening for cardiovascular disorders: Secondary | ICD-10-CM

## 2013-04-16 DIAGNOSIS — M179 Osteoarthritis of knee, unspecified: Secondary | ICD-10-CM | POA: Insufficient documentation

## 2013-04-16 DIAGNOSIS — R635 Abnormal weight gain: Secondary | ICD-10-CM

## 2013-04-16 DIAGNOSIS — I10 Essential (primary) hypertension: Secondary | ICD-10-CM | POA: Insufficient documentation

## 2013-04-16 DIAGNOSIS — IMO0002 Reserved for concepts with insufficient information to code with codable children: Secondary | ICD-10-CM

## 2013-04-16 DIAGNOSIS — R944 Abnormal results of kidney function studies: Secondary | ICD-10-CM

## 2013-04-16 DIAGNOSIS — M1710 Unilateral primary osteoarthritis, unspecified knee: Secondary | ICD-10-CM | POA: Insufficient documentation

## 2013-04-16 DIAGNOSIS — L989 Disorder of the skin and subcutaneous tissue, unspecified: Secondary | ICD-10-CM

## 2013-04-16 LAB — LIPID PANEL
Cholesterol: 208 mg/dL — ABNORMAL HIGH (ref 0–200)
HDL: 60.8 mg/dL (ref >39.00)
LDL Cholesterol: 109 mg/dL — ABNORMAL HIGH (ref 0–99)
Total CHOL/HDL Ratio: 3
Triglycerides: 190 mg/dL — ABNORMAL HIGH (ref 0.0–149.0)
VLDL: 38 mg/dL (ref 0.0–40.0)

## 2013-04-16 LAB — COMPREHENSIVE METABOLIC PANEL
ALT: 12 U/L (ref 0–35)
AST: 16 U/L (ref 0–37)
Albumin: 4 g/dL (ref 3.5–5.2)
Alkaline Phosphatase: 75 U/L (ref 39–117)
BUN: 25 mg/dL — ABNORMAL HIGH (ref 6–23)
CO2: 26 mEq/L (ref 19–32)
Calcium: 9.8 mg/dL (ref 8.4–10.5)
Chloride: 106 mEq/L (ref 96–112)
Creatinine, Ser: 1.3 mg/dL — ABNORMAL HIGH (ref 0.4–1.2)
GFR: 44.26 mL/min — ABNORMAL LOW (ref >60.00)
Glucose, Bld: 107 mg/dL — ABNORMAL HIGH (ref 70–99)
Potassium: 4.6 mEq/L (ref 3.5–5.1)
Sodium: 142 mEq/L (ref 135–145)
Total Bilirubin: 0.3 mg/dL (ref 0.3–1.2)
Total Protein: 7.4 g/dL (ref 6.0–8.3)

## 2013-04-16 LAB — HEMOGLOBIN A1C: Hgb A1c MFr Bld: 6.4 % (ref 4.6–6.5)

## 2013-04-16 LAB — TSH: TSH: 0.75 u[IU]/mL (ref 0.35–5.50)

## 2013-04-16 LAB — T4, FREE: Free T4: 0.96 ng/dL (ref 0.60–1.60)

## 2013-04-16 MED ORDER — LISINOPRIL 20 MG PO TABS: 20.0000 mg | ORAL_TABLET | Freq: Every day | ORAL | Status: DC

## 2013-04-16 NOTE — Assessment & Plan Note (Signed)
Symptoms controlled with conservative tx. Followed by ortho. No changes.

## 2013-04-16 NOTE — Assessment & Plan Note (Signed)
Well controlled on current dose of lisinopril. eRx sent to pharmacy for refills. Check renal function and electrolytes today.

## 2013-04-16 NOTE — Telephone Encounter (Signed)
Relevant patient education assigned to patient using Emmi. ° °

## 2013-04-16 NOTE — Progress Notes (Signed)
Subjective:   Patient ID: Briana Ochoa, female    DOB: 1940/07/17, 73 y.o.   MRN: [id number]  Briana Ochoa is a pleasant 73 y.o. year old female who presents to clinic today with Establish Care  on 04/16/2013  HPI: HTN- has been on lisinopril 20 mg daily for a couple of years. BP has been well controlled. Denies any HA, blurred vision, CP or SOB> No LE edema.  OA of left knee- has been followed by Raliegh Ip. On voltaren which does help.  Plan is to continue this and pursue cortisone injections next.  DM- has been diet controlled for years. She has been gained weight- 30 pounds over past few years.  Was less active once her husband, who has since passed away, become more ill.  Denies any increased thirst or urination.  Facial lesions- "spots" keep popping up over past year.  Patient Active Problem List   Diagnosis Date Noted  . HTN (hypertension) 04/16/2013  . OA (osteoarthritis) of knee 04/16/2013  . Weight gain 04/16/2013  . Diabetes 04/16/2013   Past Medical History  Diagnosis Date  . Hypertension   . Diabetes mellitus without complication   . Heart murmur   . Arthritis   . Arthritis of knee    Past Surgical History  Procedure Laterality Date  . Cesarean section    . Cholecystectomy    . Splenectomy, total     History  Substance Use Topics  . Smoking status: Former Research scientist (life sciences)  . Smokeless tobacco: Not on file  . Alcohol Use: No   Family History  Problem Relation Age of Onset  . Cancer Mother     kidney  . Heart attack Paternal Grandfather    No Known Allergies Current Outpatient Prescriptions on File Prior to Visit  Medication Sig Dispense Refill  . aspirin 81 MG tablet Take 81 mg by mouth daily.      . diclofenac (VOLTAREN) 75 MG EC tablet Take 1 tablet (75 mg total) by mouth 2 (two) times daily.  30 tablet  1  . docusate sodium (COLACE) 100 MG capsule Take 100 mg by mouth daily as needed for mild constipation.      [name] ibuprofen  (ADVIL,MOTRIN) 200 MG tablet Take 400 mg by mouth every 6 (six) hours as needed for moderate pain.       No current facility-administered medications on file prior to visit.   The PMH, PSH, Social History, Family History, Medications, and allergies have been reviewed in [facility], and have been updated if relevant.   Review of Systems  Constitutional: Positive for activity change. Negative for appetite change.  Endocrine: Negative for polydipsia, polyphagia and polyuria.  Genitourinary: Negative for frequency.      See HPI  Objective:    BP 126/80  Pulse 75  Temp(Src) 98.4 F (36.9 C) (Oral)  Ht 5' 3.5" (1.613 m)  Wt 198 lb 8 oz (90.039 kg)  BMI 34.61 kg/m2  SpO2 97%   Physical Exam  Constitutional: She is oriented to person, place, and time. She appears well-developed and well-nourished. No distress.  HENT:  Head: Normocephalic and atraumatic.  Cardiovascular: Normal rate and regular rhythm.   Pulmonary/Chest: Effort normal and breath sounds normal.  Abdominal: Soft. Bowel sounds are normal.  Neurological: She is alert and oriented to person, place, and time.  Skin: Skin is warm and dry. Lesion noted. No erythema.     Psychiatric: She has a normal mood and affect. Her behavior is normal.  Judgment and thought content normal.          Assessment & Plan:   HTN (hypertension)  OA (osteoarthritis) of knee  Weight gain - Plan: TSH, T4, Free, Comprehensive metabolic panel  Diabetes - Plan: Hemoglobin A1c  Screening for ischemic heart disease - Plan: Lipid panel  Skin lesion - Plan: Ambulatory referral to Dermatology Return in about 3 months (around 07/17/2013) for annual medicare wellness visit.

## 2013-04-16 NOTE — Patient Instructions (Signed)
It was nice to meet you.  Check with your insurance to see if they will cover the shingles shot.  We will call you with your lab results.

## 2013-04-16 NOTE — Assessment & Plan Note (Signed)
Likely due to decreased activity but will check labs today to rule out other contributing factors. The patient indicates understanding of these issues and agrees with the plan.

## 2013-04-16 NOTE — Progress Notes (Signed)
Pre visit review using our clinic review tool, if applicable. No additional management support is needed unless otherwise documented below in the visit note. 

## 2013-04-16 NOTE — Assessment & Plan Note (Signed)
Has been diet controlled but given weight gain, will check a1c today.

## 2013-04-18 ENCOUNTER — Telehealth: Payer: Self-pay

## 2013-04-18 NOTE — Telephone Encounter (Signed)
Relevant patient education assigned to patient using Emmi. ° °

## 2013-05-20 ENCOUNTER — Other Ambulatory Visit: Payer: Self-pay | Admitting: Physician Assistant

## 2013-05-29 ENCOUNTER — Ambulatory Visit: Payer: Self-pay | Admitting: Nephrology

## 2013-07-17 ENCOUNTER — Encounter (INDEPENDENT_AMBULATORY_CARE_PROVIDER_SITE_OTHER): Payer: Self-pay

## 2013-07-17 ENCOUNTER — Ambulatory Visit (INDEPENDENT_AMBULATORY_CARE_PROVIDER_SITE_OTHER): Payer: Commercial Managed Care - HMO | Admitting: Family Medicine

## 2013-07-17 ENCOUNTER — Encounter: Payer: Self-pay | Admitting: Family Medicine

## 2013-07-17 ENCOUNTER — Telehealth: Payer: Self-pay | Admitting: Family Medicine

## 2013-07-17 VITALS — BP 128/82 | HR 66 | Temp 98.1°F | Ht 64.0 in | Wt 193.0 lb

## 2013-07-17 DIAGNOSIS — IMO0002 Reserved for concepts with insufficient information to code with codable children: Secondary | ICD-10-CM

## 2013-07-17 DIAGNOSIS — Z87891 Personal history of nicotine dependence: Secondary | ICD-10-CM | POA: Insufficient documentation

## 2013-07-17 DIAGNOSIS — M179 Osteoarthritis of knee, unspecified: Secondary | ICD-10-CM

## 2013-07-17 DIAGNOSIS — R7989 Other specified abnormal findings of blood chemistry: Secondary | ICD-10-CM

## 2013-07-17 DIAGNOSIS — Z1211 Encounter for screening for malignant neoplasm of colon: Secondary | ICD-10-CM

## 2013-07-17 DIAGNOSIS — N1832 Chronic kidney disease, stage 3b: Secondary | ICD-10-CM | POA: Insufficient documentation

## 2013-07-17 DIAGNOSIS — Z0000 Encounter for general adult medical examination without abnormal findings: Secondary | ICD-10-CM

## 2013-07-17 DIAGNOSIS — Z1231 Encounter for screening mammogram for malignant neoplasm of breast: Secondary | ICD-10-CM

## 2013-07-17 DIAGNOSIS — N183 Chronic kidney disease, stage 3 unspecified: Secondary | ICD-10-CM | POA: Insufficient documentation

## 2013-07-17 DIAGNOSIS — I10 Essential (primary) hypertension: Secondary | ICD-10-CM

## 2013-07-17 DIAGNOSIS — R799 Abnormal finding of blood chemistry, unspecified: Secondary | ICD-10-CM

## 2013-07-17 DIAGNOSIS — M1710 Unilateral primary osteoarthritis, unspecified knee: Secondary | ICD-10-CM

## 2013-07-17 DIAGNOSIS — E119 Type 2 diabetes mellitus without complications: Secondary | ICD-10-CM

## 2013-07-17 LAB — COMPREHENSIVE METABOLIC PANEL
ALT: 10 U/L (ref 0–35)
AST: 17 U/L (ref 0–37)
Albumin: 4 g/dL (ref 3.5–5.2)
Alkaline Phosphatase: 68 U/L (ref 39–117)
BUN: 21 mg/dL (ref 6–23)
CO2: 26 mEq/L (ref 19–32)
Calcium: 9.6 mg/dL (ref 8.4–10.5)
Chloride: 108 mEq/L (ref 96–112)
Creatinine, Ser: 1.2 mg/dL (ref 0.4–1.2)
GFR: 47.25 mL/min — ABNORMAL LOW (ref >60.00)
Glucose, Bld: 105 mg/dL — ABNORMAL HIGH (ref 70–99)
Potassium: 4.6 mEq/L (ref 3.5–5.1)
Sodium: 141 mEq/L (ref 135–145)
Total Bilirubin: 0.6 mg/dL (ref 0.2–1.2)
Total Protein: 7.1 g/dL (ref 6.0–8.3)

## 2013-07-17 LAB — HEMOGLOBIN A1C: Hgb A1c MFr Bld: 6.4 % (ref 4.6–6.5)

## 2013-07-17 LAB — MICROALBUMIN / CREATININE URINE RATIO
Creatinine,U: 131.7 mg/dL
Microalb Creat Ratio: 2.4 mg/g (ref 0.0–30.0)
Microalb, Ur: 3.2 mg/dL — ABNORMAL HIGH (ref 0.0–1.9)

## 2013-07-17 MED ORDER — GLUCOSE BLOOD VI STRP: ORAL_STRIP | Status: DC

## 2013-07-17 NOTE — Assessment & Plan Note (Signed)
Well controlled on current rx. No changes. 

## 2013-07-17 NOTE — Progress Notes (Signed)
Pre visit review using our clinic review tool, if applicable. No additional management support is needed unless otherwise documented below in the visit note. 

## 2013-07-17 NOTE — Progress Notes (Signed)
Subjective:   Patient ID: Briana Ochoa, female    DOB: November 01, 1940, 73 y.o.   MRN: [id number]  Briana Ochoa is a pleasant 73 y.o. year old female who presents to clinic today with Annual Exam  on 07/17/2013 Established care with me in 04/2013.  I have personally reviewed the Medicare Annual Wellness questionnaire and have noted 1. The patient's medical and social history 2. Their use of alcohol, tobacco or illicit drugs 3. Their current medications and supplements 4. The patient's functional ability including ADL's, fall risks, home safety risks and hearing or visual             impairment. 5. Diet and physical activities 6. Evidence for depression or mood disorders  End of life wishes discussed and updated in Social History. Has never had a colonoscopy. Had pneumovax in late 60s per pt. Has never had the shingles vaccine. Tdap last year.  Eye exam 11/2012- Dr. Tyrone Schimke.  HTN- has been on lisinopril 20 mg daily for a couple of years. BP has been well controlled. Denies any HA, blurred vision, CP or SOB.  No LE edema.  Lab Results  Component Value Date   CREATININE 1.3* 04/16/2013     OA of left knee- has been followed by Raliegh Ip. Was on Voltaren but stopped it because elevated Cr when I saw her 04/2013.  Started doing Tai Chi- she feels this is helping signficantly.   DM- has been diet controlled for years. She has been gained weight- 30 pounds over past few years.  Was less active once her husband, who has since passed away, become more ill.  Lab Results  Component Value Date   HGBA1C 6.4 04/16/2013   Lab Results  Component Value Date   CHOL 208* 04/16/2013   HDL 60.80 04/16/2013   LDLCALC 109* 04/16/2013   TRIG 190.0* 04/16/2013   CHOLHDL 3 04/16/2013    Current Outpatient Prescriptions on File Prior to Visit  Medication Sig Dispense Refill  . aspirin 81 MG tablet Take 81 mg by mouth daily.      [name] lisinopril (PRINIVIL,ZESTRIL) 20 MG tablet Take 1  tablet (20 mg total) by mouth daily.  90 tablet  3   No current facility-administered medications on file prior to visit.    No Known Allergies  Past Medical History  Diagnosis Date  . Hypertension   . Diabetes mellitus without complication   . Heart murmur   . Arthritis   . Arthritis of knee     Past Surgical History  Procedure Laterality Date  . Cesarean section    . Cholecystectomy    . Splenectomy, total      Family History  Problem Relation Age of Onset  . Cancer Mother     kidney  . Heart attack Paternal Grandfather     History   Social History  . Marital Status: Married    Spouse Name: N/A    Number of Children: N/A  . Years of Education: N/A   Occupational History  . Not on file.   Social History Main Topics  . Smoking status: Former Research scientist (life sciences)  . Smokeless tobacco: Not on file  . Alcohol Use: No  . Drug Use: No  . Sexual Activity: Not on file   Other Topics Concern  . Not on file   Social History Narrative   Widowed   5 children   17 grandchildren   The PMH, PSH, Social History, Family History, Medications, and allergies have  been reviewed in [facility], and have been updated if relevant.  Review of Systems    See HPI Patient reports no  vision/ hearing changes,anorexia, weight change, fever ,adenopathy, persistant / recurrent hoarseness, swallowing issues, chest pain, edema,persistant / recurrent cough, hemoptysis, dyspnea(rest, exertional, paroxysmal nocturnal), gastrointestinal  bleeding (melena, rectal bleeding), abdominal pain, excessive heart burn, GU symptoms(dysuria, hematuria, pyuria, voiding/incontinence  Issues) syncope, focal weakness, severe memory loss, concerning skin lesions, depression, anxiety, abnormal bruising/bleeding, major joint swelling, breast masses or abnormal vaginal bleeding.    Objective:    BP 128/82  Pulse 66  Temp(Src) 98.1 F (36.7 C) (Oral)  Ht _0  (1.626 m)  Wt 193 lb (87.544 kg)  BMI 33.11 kg/m2  SpO2  96%   Physical Exam   General:  Well-developed,well-nourished,in no acute distress; alert,appropriate and cooperative throughout examination Head:  normocephalic and atraumatic.   Eyes:  vision grossly intact, pupils equal, pupils round, and pupils reactive to light.   Ears:  R ear normal and L ear normal.   Nose:  no external deformity.   Mouth:  good dentition.   Neck:  No deformities, masses, or tenderness noted. Breasts:  No mass, nodules, thickening, tenderness, bulging, retraction, inflamation, nipple discharge or skin changes noted.   Lungs:  Normal respiratory effort, chest expands symmetrically. Lungs are clear to auscultation, no crackles or wheezes. Heart:  Normal rate and regular rhythm. S1 and S2 normal without gallop, murmur, click, rub or other extra sounds. Abdomen:  Bowel sounds positive,abdomen soft and non-tender without masses, organomegaly or hernias noted. Msk:  No deformity or scoliosis noted of thoracic or lumbar spine.   Extremities:  No clubbing, cyanosis, edema, or deformity noted with normal full range of motion of all joints.   Neurologic:  alert & oriented X3 and gait normal.   Skin:  Intact without suspicious lesions or rashes Cervical Nodes:  No lymphadenopathy noted Axillary Nodes:  No palpable lymphadenopathy Psych:  Cognition and judgment appear intact. Alert and cooperative with normal attention span and concentration. No apparent delusions, illusions, hallucinations     Assessment & Plan:   Medicare annual wellness visit, initial  Diabetes  HTN (hypertension)  OA (osteoarthritis) of knee No Follow-up on file.

## 2013-07-17 NOTE — Assessment & Plan Note (Signed)
The patients weight, height, BMI and visual acuity have been recorded in the chart I have made referrals, counseling and provided education to the patient based review of the above and I have provided the pt with a written personalized care plan for preventive services.  IFOB today. She will call to check with insurance about prevnar 13 and zostavax coverage.

## 2013-07-17 NOTE — Telephone Encounter (Signed)
Relevant patient education assigned to patient using Emmi. ° °

## 2013-07-17 NOTE — Assessment & Plan Note (Signed)
CT low dose cancer screening ordered.

## 2013-07-17 NOTE — Assessment & Plan Note (Signed)
Diet controlled. On ACEI. Check labs,urine micro today.

## 2013-07-17 NOTE — Patient Instructions (Addendum)
Great to see you.  Please call to set up your mammogram.  Check with your insurance to see if they will cover the shingles shot and prevnar 13.  We will call you with your chest ct scan appointment.

## 2013-07-17 NOTE — Assessment & Plan Note (Signed)
She stopped taking NSAIDs. Will recheck renal function today.

## 2013-07-24 ENCOUNTER — Ambulatory Visit
Admission: RE | Admit: 2013-07-24 | Discharge: 2013-07-24 | Disposition: A | Discharge status: Home / Self Care | Payer: Medicare HMO | Source: Ambulatory Visit | Attending: Family Medicine | Admitting: Family Medicine

## 2013-07-24 DIAGNOSIS — Z1231 Encounter for screening mammogram for malignant neoplasm of breast: Secondary | ICD-10-CM

## 2013-07-26 ENCOUNTER — Other Ambulatory Visit: Payer: Self-pay | Admitting: Family Medicine

## 2013-07-26 DIAGNOSIS — R928 Other abnormal and inconclusive findings on diagnostic imaging of breast: Secondary | ICD-10-CM

## 2013-08-02 ENCOUNTER — Encounter (HOSPITAL_COMMUNITY): Payer: Self-pay | Admitting: Emergency Medicine

## 2013-08-02 ENCOUNTER — Emergency Department (HOSPITAL_COMMUNITY)
Admission: EM | Admit: 2013-08-02 | Discharge: 2013-08-02 | Disposition: A | Discharge status: Home / Self Care | Payer: Commercial Managed Care - HMO | Source: Home / Self Care | Attending: Emergency Medicine | Admitting: Emergency Medicine

## 2013-08-02 ENCOUNTER — Telehealth: Payer: Self-pay

## 2013-08-02 DIAGNOSIS — H8112 Benign paroxysmal vertigo, left ear: Secondary | ICD-10-CM

## 2013-08-02 DIAGNOSIS — H8110 Benign paroxysmal vertigo, unspecified ear: Secondary | ICD-10-CM

## 2013-08-02 MED ORDER — MECLIZINE HCL 12.5 MG PO TABS: 12.5000 mg | ORAL_TABLET | Freq: Three times a day (TID) | ORAL | Status: DC | PRN

## 2013-08-02 NOTE — Telephone Encounter (Signed)
Megan with [facility] said pt started with episode today of dizziness; pt ate then vomited x 1. Pt is still dizzy and neighbor that is a nurse took BP 155/96 with reg pulse. No H/A, CP, SOB or palpitations and no bleeding. Pt is so dizzy cannot walk without assistance. No available appts at [facility] or [place]. Advised pt to go to [facility] for eval. Jinny Blossom will notify pt.

## 2013-08-02 NOTE — ED Provider Notes (Signed)
  Chief Complaint   Chief Complaint  Patient presents with  . Dizziness    History of Present Illness   Briana Ochoa is a 73 year old female who shortly after a lunch today noted spinning vertigo. At first this would come and go and was only problem with head movement, then it became fairly continuous, now it's improved and only occurs in certain positions. It's been associated with nausea and she vomited once. She denies any ear pain, although has had chronic difficulty hearing and ringing in the ears. This is not any worse than usual. She denies any headache, diplopia, or blurry vision. No nasal congestion or rhinorrhea. No sore throat, stiff neck, or fever. She has not had any chest pain, shortness of breath, or palpitations. She denies any paresthesias, muscle weakness, or difficulty with speech or ambulation. No head trauma. She's had something like this a month ago that resolved on its own.  Review of Systems   Other than noted above, the patient denies any of the following symptoms: Systemic:  No fever, chills, fatigue, or weight loss. Eye:  No blurred vision, visual change or diplopia. ENT:  No ear pain, tinnitus, hearing loss, nasal congestion, or rhinorrhea. Cardiac:  No chest pain, dyspnea, palpitations or syncope. Neuro:  No headache, paresthesias, weakness, trouble with speech, coordination or ambulation.  [place]   Past medical history, family history, social history, meds, and allergies were reviewed.    Physical Examination     Vital signs:  BP 148/80  Pulse 69  Temp(Src) 97.9 F (36.6 C) (Oral)  Resp 16  SpO2 98% General:  Alert, oriented times 3, in no distress. Eye:  PERRL, full EOM, no nystagmus. ENT:  TMs and canals normal.  Nasal mucosa normal.  Pharynx clear. Neck:  No adenopathy, tenderness, or mass.  Thyroid normal.  No carotid bruit. Lungs:  Breath sounds clear and equal bilaterally.  No wheezes, rales or rhonchi. Heart:  Regular rhythm.  No  gallops, murmers, or rubs. Neuro:  Alert and oriented times 3.  Cranial nerves intact.  No pronator drift.  Finger to nose normal.  No focal weakness.  Sensation intact to light touch.  Romberg's sign negative, gait normal.  Able to do tandem gait well.  Dix-Hallpike maneuver was positive with the left ear down.  [facility] in [facility]   An Epley maneuver was performed.  Assessment   The encounter diagnosis was Benign positional vertigo, left.  Due to canalithiasis. There is no evidence for stroke.  Plan     1.  Meds:  The following meds were prescribed:   Discharge Medication List as of 08/02/2013  5:04 PM    START taking these medications   Details  meclizine (ANTIVERT) 12.5 MG tablet Take 1 tablet (12.5 mg total) by mouth 3 (three) times daily as needed for dizziness., Starting 08/02/2013, Until Discontinued, Normal        2.  Patient Education/Counseling:  The patient was given appropriate handouts, self care instructions, and instructed in symptomatic relief.  Instructed in Epley exercises.  3.  Follow up:  The patient was told to follow up here if no better in one week, or sooner if becoming worse in any way, and given some red flag symptoms such as new neurological symptoms, chest pain, or syncope which would prompt immediate return.       Harden Mo, MD 08/02/13 2150

## 2013-08-02 NOTE — Telephone Encounter (Signed)
Patient Information:  Caller Name: Salisa  Phone: [phone]  Patient: Briana Ochoa  Gender: Female  DOB: [birth year]  Age: 73 Years  PCP: Arnette Norris [facility])  Office Follow Up:  Does the office need to follow up with this patient?: No  Instructions For The Office: N/A  RN Note:  Report to [place] who discussed with Dr. Deborra Medina. Cherlyn Labella available appointments at their location or other [place] locations. Advise going to [facility] for evaluation and if directed to ED then it is just down the hill from this UC location and both facilities have access to Epic chart. Patient made aware and agreeable.  Symptoms  Reason For Call & Symptoms: Dizziness Episode while driving. Patient suspected due to not having lunch. Patient ate lunch and then vomited. Dizziness has not subsided even with laying down and resting and to the point that she requires assistance with walking. Neighbor is a [employer] scientist and present with the patient and took her blood pressure,155/96, with regular pulse.  Reviewed Health History In EMR: Yes  Reviewed Medications In EMR: Yes  Reviewed Allergies In EMR: Yes  Reviewed Surgeries / Procedures: Yes  Date of Onset of Symptoms: 08/02/2013  Guideline(s) Used:  Dizziness  Disposition Per Guideline:   Go to ED Now (or to Office with PCP Approval)  Reason For Disposition Reached:   Severe dizziness (e.g., unable to stand, requires support to walk, feels like passing out now)  Advice Given:  N/A  Patient Will Follow Care Advice:  YES

## 2013-08-02 NOTE — Telephone Encounter (Signed)
AGreed

## 2013-08-02 NOTE — Discharge Instructions (Signed)
You have been diagnosed with benign positional vertigo.  The cause of this is a displaced particle or crystal in the inner ear.  This can be cured by doing the exercise described below, called the Epley exercise developed by Dr. Horald Chestnut.  Do this exercise three times daily until you are able to do it for 24 hours without getting dizzy.  Sleep with your head elevated and try to avoid bending over.  If the dizziness is not better in 7 days, return here, see your primary care doctor or ENT doctor.     Benign Positional Vertigo Vertigo means you feel like you or your surroundings are moving when they are not. Benign positional vertigo is the most common form of vertigo. Benign means that the cause of your condition is not serious. Benign positional vertigo is more common in older adults. CAUSES  Benign positional vertigo is the result of an upset in the labyrinth system. This is an area in the middle ear that helps control your balance. This may be caused by a viral infection, head injury, or repetitive motion. However, often no specific cause is found. SYMPTOMS  Symptoms of benign positional vertigo occur when you move your head or eyes in different directions. Some of the symptoms may include:  Loss of balance and falls.  Vomiting.  Blurred vision.  Dizziness.  Nausea.  Involuntary eye movements (nystagmus). DIAGNOSIS  Benign positional vertigo is usually diagnosed by physical exam. If the specific cause of your benign positional vertigo is unknown, your caregiver may perform imaging tests, such as magnetic resonance imaging (MRI) or computed tomography (CT). TREATMENT  Your caregiver may recommend movements or procedures to correct the benign positional vertigo. Medicines such as meclizine, benzodiazepines, and medicines for nausea may be used to treat your symptoms. In rare cases, if your symptoms are caused by certain conditions that affect the inner ear, you may need surgery. HOME CARE  INSTRUCTIONS   Follow your caregiver's instructions.  Move slowly. Do not make sudden body or head movements.  Avoid driving.  Avoid operating heavy machinery.  Avoid performing any tasks that would be dangerous to you or others during a vertigo episode.  Drink enough fluids to keep your urine clear or pale yellow. SEEK IMMEDIATE MEDICAL CARE IF:   You develop problems with walking, weakness, numbness, or using your arms, hands, or legs.  You have difficulty speaking.  You develop severe headaches.  Your nausea or vomiting continues or gets worse.  You develop visual changes.  Your family or friends notice any behavioral changes.  Your condition gets worse.  You have a fever.  You develop a stiff neck or sensitivity to light. MAKE SURE YOU:   Understand these instructions.  Will watch your condition.  Will get help right away if you are not doing well or get worse. Document Released: 11/01/2005 Document Revised: 04/18/2011 Document Reviewed: 10/14/2010 [facility] Patient Information 2015 [place], [place]. This information is not intended to replace advice given to you by your health care provider. Make sure you discuss any questions you have with your health care provider.

## 2013-08-02 NOTE — ED Notes (Signed)
Patient c/o dizziness onset this afternoon. She did vomit once but thinks it was due to food she ate earlier. Patient reports she did feel like this once before, about a month ago but it only lasted for about 30 mins- 1 hr. Patient is alert and oriented and in no acute distress.

## 2013-08-08 ENCOUNTER — Ambulatory Visit
Admission: RE | Admit: 2013-08-08 | Discharge: 2013-08-08 | Disposition: A | Discharge status: Home / Self Care | Payer: Medicare HMO | Source: Ambulatory Visit | Attending: Family Medicine | Admitting: Family Medicine

## 2013-08-08 ENCOUNTER — Other Ambulatory Visit: Payer: Self-pay | Admitting: Family Medicine

## 2013-08-08 DIAGNOSIS — R921 Mammographic calcification found on diagnostic imaging of breast: Secondary | ICD-10-CM

## 2013-08-08 DIAGNOSIS — R928 Other abnormal and inconclusive findings on diagnostic imaging of breast: Secondary | ICD-10-CM

## 2013-08-16 ENCOUNTER — Telehealth: Payer: Self-pay | Admitting: *Deleted

## 2013-08-16 NOTE — Telephone Encounter (Signed)
Lm on pts vm requesting a call back to sched DIABET[facility] OV-needs BP check and LDL

## 2013-08-20 ENCOUNTER — Emergency Department (HOSPITAL_COMMUNITY): Payer: Medicare HMO

## 2013-08-20 ENCOUNTER — Encounter (INDEPENDENT_AMBULATORY_CARE_PROVIDER_SITE_OTHER): Payer: Self-pay

## 2013-08-20 ENCOUNTER — Inpatient Hospital Stay (HOSPITAL_COMMUNITY)
Admission: EM | Admit: 2013-08-20 | Discharge: 2013-08-21 | DRG: 149 | Disposition: A | Discharge status: Home / Self Care | Payer: Medicare HMO | Attending: Internal Medicine | Admitting: Internal Medicine

## 2013-08-20 ENCOUNTER — Ambulatory Visit
Admission: RE | Admit: 2013-08-20 | Discharge: 2013-08-20 | Disposition: A | Discharge status: Home / Self Care | Payer: Commercial Managed Care - HMO | Source: Ambulatory Visit | Attending: Family Medicine | Admitting: Family Medicine

## 2013-08-20 ENCOUNTER — Other Ambulatory Visit: Payer: Self-pay | Admitting: Family Medicine

## 2013-08-20 ENCOUNTER — Encounter (HOSPITAL_COMMUNITY): Payer: Self-pay | Admitting: Emergency Medicine

## 2013-08-20 ENCOUNTER — Other Ambulatory Visit (HOSPITAL_COMMUNITY): Payer: Self-pay | Admitting: Diagnostic Radiology

## 2013-08-20 ENCOUNTER — Ambulatory Visit
Admission: RE | Admit: 2013-08-20 | Discharge: 2013-08-20 | Disposition: A | Discharge status: Home / Self Care | Payer: Medicare HMO | Source: Ambulatory Visit | Attending: Family Medicine | Admitting: Family Medicine

## 2013-08-20 DIAGNOSIS — I671 Cerebral aneurysm, nonruptured: Secondary | ICD-10-CM

## 2013-08-20 DIAGNOSIS — H9319 Tinnitus, unspecified ear: Secondary | ICD-10-CM | POA: Diagnosis present

## 2013-08-20 DIAGNOSIS — Z8051 Family history of malignant neoplasm of kidney: Secondary | ICD-10-CM | POA: Diagnosis not present

## 2013-08-20 DIAGNOSIS — Z8249 Family history of ischemic heart disease and other diseases of the circulatory system: Secondary | ICD-10-CM

## 2013-08-20 DIAGNOSIS — H9190 Unspecified hearing loss, unspecified ear: Secondary | ICD-10-CM | POA: Diagnosis present

## 2013-08-20 DIAGNOSIS — IMO0002 Reserved for concepts with insufficient information to code with codable children: Secondary | ICD-10-CM

## 2013-08-20 DIAGNOSIS — I10 Essential (primary) hypertension: Secondary | ICD-10-CM | POA: Diagnosis present

## 2013-08-20 DIAGNOSIS — E119 Type 2 diabetes mellitus without complications: Secondary | ICD-10-CM | POA: Diagnosis present

## 2013-08-20 DIAGNOSIS — Z0000 Encounter for general adult medical examination without abnormal findings: Secondary | ICD-10-CM

## 2013-08-20 DIAGNOSIS — D320 Benign neoplasm of cerebral meninges: Secondary | ICD-10-CM | POA: Diagnosis present

## 2013-08-20 DIAGNOSIS — R635 Abnormal weight gain: Secondary | ICD-10-CM

## 2013-08-20 DIAGNOSIS — D429 Neoplasm of uncertain behavior of meninges, unspecified: Secondary | ICD-10-CM

## 2013-08-20 DIAGNOSIS — D72829 Elevated white blood cell count, unspecified: Secondary | ICD-10-CM | POA: Diagnosis present

## 2013-08-20 DIAGNOSIS — H8110 Benign paroxysmal vertigo, unspecified ear: Principal | ICD-10-CM | POA: Diagnosis present

## 2013-08-20 DIAGNOSIS — R011 Cardiac murmur, unspecified: Secondary | ICD-10-CM | POA: Diagnosis present

## 2013-08-20 DIAGNOSIS — Z7982 Long term (current) use of aspirin: Secondary | ICD-10-CM | POA: Diagnosis not present

## 2013-08-20 DIAGNOSIS — Z79899 Other long term (current) drug therapy: Secondary | ICD-10-CM | POA: Diagnosis not present

## 2013-08-20 DIAGNOSIS — R42 Dizziness and giddiness: Secondary | ICD-10-CM | POA: Diagnosis present

## 2013-08-20 DIAGNOSIS — I720 Aneurysm of carotid artery: Secondary | ICD-10-CM | POA: Diagnosis present

## 2013-08-20 DIAGNOSIS — E089 Diabetes mellitus due to underlying condition without complications: Secondary | ICD-10-CM

## 2013-08-20 DIAGNOSIS — R921 Mammographic calcification found on diagnostic imaging of breast: Secondary | ICD-10-CM

## 2013-08-20 DIAGNOSIS — M1710 Unilateral primary osteoarthritis, unspecified knee: Secondary | ICD-10-CM | POA: Diagnosis present

## 2013-08-20 DIAGNOSIS — R7989 Other specified abnormal findings of blood chemistry: Secondary | ICD-10-CM

## 2013-08-20 DIAGNOSIS — Z87891 Personal history of nicotine dependence: Secondary | ICD-10-CM

## 2013-08-20 DIAGNOSIS — N63 Unspecified lump in unspecified breast: Secondary | ICD-10-CM | POA: Diagnosis present

## 2013-08-20 LAB — URINALYSIS, ROUTINE W REFLEX MICROSCOPIC
Bilirubin Urine: NEGATIVE
Glucose, UA: NEGATIVE mg/dL
Hgb urine dipstick: NEGATIVE
Ketones, ur: 15 mg/dL — AB
Leukocytes, UA: NEGATIVE
Nitrite: NEGATIVE
Protein, ur: NEGATIVE mg/dL
Specific Gravity, Urine: 1.023 (ref 1.005–1.030)
Urobilinogen, UA: 1 mg/dL (ref 0.0–1.0)
pH: 6 (ref 5.0–8.0)

## 2013-08-20 LAB — CBC WITH DIFFERENTIAL/PLATELET
Basophils Absolute: 0.1 10*3/uL (ref 0.0–0.1)
Basophils Relative: 0 % (ref 0–1)
Eosinophils Absolute: 0 10*3/uL (ref 0.0–0.7)
Eosinophils Relative: 0 % (ref 0–5)
HCT: 47.9 % — ABNORMAL HIGH (ref 36.0–46.0)
Hemoglobin: 16.4 g/dL — ABNORMAL HIGH (ref 12.0–15.0)
Lymphocytes Relative: 6 % — ABNORMAL LOW (ref 12–46)
Lymphs Abs: 1.1 10*3/uL (ref 0.7–4.0)
MCH: 30.7 pg (ref 26.0–34.0)
MCHC: 34.2 g/dL (ref 30.0–36.0)
MCV: 89.5 fL (ref 78.0–100.0)
Monocytes Absolute: 0.6 10*3/uL (ref 0.1–1.0)
Monocytes Relative: 3 % (ref 3–12)
Neutro Abs: 15.6 10*3/uL — ABNORMAL HIGH (ref 1.7–7.7)
Neutrophils Relative %: 91 % — ABNORMAL HIGH (ref 43–77)
Platelets: 292 10*3/uL (ref 150–400)
RBC: 5.35 MIL/uL — ABNORMAL HIGH (ref 3.87–5.11)
RDW: 13.7 % (ref 11.5–15.5)
WBC: 17.3 10*3/uL — ABNORMAL HIGH (ref 4.0–10.5)

## 2013-08-20 LAB — TROPONIN I: Troponin I: <0.3 ng/mL (ref <0.30)

## 2013-08-20 LAB — BASIC METABOLIC PANEL
Anion gap: 15 (ref 5–15)
BUN: 17 mg/dL (ref 6–23)
CO2: 24 mEq/L (ref 19–32)
Calcium: 10.1 mg/dL (ref 8.4–10.5)
Chloride: 103 mEq/L (ref 96–112)
Creatinine, Ser: 1.09 mg/dL (ref 0.50–1.10)
GFR calc Af Amer: 57 mL/min — ABNORMAL LOW (ref >90)
GFR calc non Af Amer: 49 mL/min — ABNORMAL LOW (ref >90)
Glucose, Bld: 144 mg/dL — ABNORMAL HIGH (ref 70–99)
Potassium: 4.6 mEq/L (ref 3.7–5.3)
Sodium: 142 mEq/L (ref 137–147)

## 2013-08-20 MED ORDER — IOHEXOL 350 MG/ML SOLN
100.0000 mL | Freq: Once | INTRAVENOUS | Status: AC | PRN
  Administered 2013-08-20: 100 mL via INTRAVENOUS

## 2013-08-20 MED ORDER — SODIUM CHLORIDE 0.9 % IV SOLN
INTRAVENOUS | Status: DC
  Administered 2013-08-20: 21:00:00 via INTRAVENOUS

## 2013-08-20 MED ORDER — ONDANSETRON HCL 4 MG/2ML IJ SOLN
4.0000 mg | INTRAMUSCULAR | Status: DC | PRN
  Administered 2013-08-20: 4 mg via INTRAVENOUS
  Filled 2013-08-20: qty 2

## 2013-08-20 MED ORDER — MECLIZINE HCL 25 MG PO TABS
25.0000 mg | ORAL_TABLET | Freq: Once | ORAL | Status: AC
  Administered 2013-08-20: 25 mg via ORAL
  Filled 2013-08-20: qty 1

## 2013-08-20 NOTE — H&P (Addendum)
[facility]                                                                                    Patient Demographics  Briana Ochoa, is a 73 y.o. female  CSN: [id number]  MRN: [id number]  DOB - [birth year]  Admit Date - 08/20/2013  Outpatient Primary MD for the patient is Arnette Norris, MD   With History of -  Past Medical History  Diagnosis Date  . Hypertension   . Diabetes mellitus without complication   . Heart murmur   . Arthritis   . Arthritis of knee   . Vertigo       Past Surgical History  Procedure Laterality Date  . Cesarean section    . Cholecystectomy    . Splenectomy, total    . Breast biopsy      Left    in for   Chief Complaint  Patient presents with  . Dizziness  . Emesis     HPI  Briana Ochoa  is a 73 y.o. female, with past medical history significant for hypertension, diabetes mellitus and arthritis presenting today for recurrent episodes of vertigo for the last 2-3 months. Her episode today was associated with nausea and vomiting and was severe. Patient went to an urgent care center on 6/26 and she was given Antivert. Her symptoms improved slightly but then started worsening again until today when she had the worst episode. Patient denies any headaches or visual symptoms denies dizziness or loss of consciousness. No previous history of intracerebral bleed    Review of Systems    In addition to the HPI above,  No Fever-chills, No Headache, No changes with Vision or hearing, No problems swallowing food or Liquids, No Chest pain, Cough or Shortness of Breath, No Abdominal pain, No Nausea or Vommitting, Bowel movements are regular, No Blood in stool or Urine, No dysuria, No new skin rashes or bruises, No new joints pains-aches,  No new weakness, tingling, numbness in any extremity, No recent weight gain or loss, No polyuria, polydypsia or polyphagia, No significant Mental Stressors.  A full 10 point Review of Systems  was done, except as stated above, all other Review of Systems were negative.   Social History History  Substance Use Topics  . Smoking status: Former Research scientist (life sciences)  . Smokeless tobacco: Never Used  . Alcohol Use: No     Family History Family History  Problem Relation Age of Onset  . Cancer Mother     kidney  . Heart attack Paternal Grandfather      Prior to Admission medications   Medication Sig Start Date End Date Taking? Authorizing Provider  aspirin 81 MG tablet Take 81 mg by mouth daily.   Yes Historical Provider, MD  glucose blood test strip Use as instructed 07/17/13  Yes Lucille Passy, MD  lisinopril (PRINIVIL,ZESTRIL) 20 MG tablet Take 1 tablet (20 mg total) by mouth daily. 04/16/13  Yes Lucille Passy, MD  meclizine (ANTIVERT) 12.5 MG tablet Take 1 tablet (12.5 mg total) by mouth 3 (three) times daily as needed for dizziness. 08/02/13  Yes Harden Mo, MD  Vitamin D, Ergocalciferol, (DRISDOL)  50000 UNITS CAPS capsule Take 50,000 Units by mouth every 7 (seven) days.   Yes Historical Provider, MD    No Known Allergies  Physical Exam  Vitals  Blood pressure 147/67, pulse 70, temperature 98.1 F (36.7 C), temperature source Oral, resp. rate 15, height 5\' 4"  (1.626 m), weight 87.544 kg (193 lb), SpO2 96.00%.   1. General a very pleasant elderly white female that looks younger than age  5. Normal affect and insight, Not Suicidal or Homicidal, Awake Alert, Oriented X 3.  3. No F.N deficits, ALL C.Nerves Intact, Strength 5/5 all 4 extremities,   4. Ears and Eyes appear Normal, Conjunctivae clear, PERRLA. Moist Oral Mucosa.  5. Supple Neck, No JVD, No cervical lymphadenopathy appriciated, No Carotid Bruits.  6. Symmetrical Chest wall movement, Good air movement bilaterally, CTAB.  7. RRR, No Gallops, Rubs or Murmurs, No Parasternal Heave.  8. Positive Bowel Sounds, Abdomen Soft, Non tender, No organomegaly appriciated,No rebound -guarding or rigidity.  9.  No Cyanosis,  Normal Skin Turgor, No Skin Rash or Bruise.  10. Good muscle tone,  joints appear normal , no effusions, Normal ROM.  11. No Palpable Lymph Nodes in Neck or Axillae    Data Review  CBC  Recent Labs Lab 08/20/13 2026  WBC 17.3*  HGB 16.4*  HCT 47.9*  PLT 292  MCV 89.5  MCH 30.7  MCHC 34.2  RDW 13.7  LYMPHSABS 1.1  MONOABS 0.6  EOSABS 0.0  BASOSABS 0.1   ------------------------------------------------------------------------------------------------------------------  Chemistries   Recent Labs Lab 08/20/13 2026  NA 142  K 4.6  CL 103  CO2 24  GLUCOSE 144*  BUN 17  CREATININE 1.09  CALCIUM 10.1   ------------------------------------------------------------------------------------------------------------------ estimated creatinine clearance is 49.2 ml/min (by C-G formula based on Cr of 1.09). ------------------------------------------------------------------------------------------------------------------ No results found for this basename: TSH, T4TOTAL, FREET3, T3FREE, THYROIDAB,  in the last 72 hours   Coagulation profile No results found for this basename: INR, PROTIME,  in the last 168 hours ------------------------------------------------------------------------------------------------------------------- No results found for this basename: DDIMER,  in the last 72 hours -------------------------------------------------------------------------------------------------------------------  Cardiac Enzymes  Recent Labs Lab 08/20/13 2026  TROPONINI <0.30   ------------------------------------------------------------------------------------------------------------------ No components found with this basename: POCBNP,    ---------------------------------------------------------------------------------------------------------------  Urinalysis    Component Value Date/Time   COLORURINE YELLOW 08/20/2013 2018   [place] 08/20/2013 2018   [place]  1.023 08/20/2013 2018   [place] 6.0 08/20/2013 2018   [place] 08/20/2013 2018   [place] 08/20/2013 2018   [place] NEGATIVE 08/20/2013 2018   KETONESUR 15* 08/20/2013 2018   PROTEINUR NEGATIVE 08/20/2013 2018   UROBILINOGEN 1.0 08/20/2013 2018   NITRITE NEGATIVE 08/20/2013 2018   LEUKOCYTESUR NEGATIVE 08/20/2013 2018    ----------------------------------------------------------------------------------------------------------------     Imaging results:   Ct Angio Head W/cm &/or Wo Cm  08/20/2013   CLINICAL DATA:  Evaluate for aneurysm.  Follow-up abnormal head CT.  EXAM: CT ANGIOGRAPHY HEAD  TECHNIQUE: Multidetector CT imaging of the head was performed using the standard protocol during bolus administration of intravenous contrast. Multiplanar CT image reconstructions and MIPs were obtained to evaluate the vascular anatomy.  CONTRAST:  126mL OMNIPAQUE IOHEXOL 350 MG/ML SOLN  COMPARISON:  None.  FINDINGS: ANTERIOR CIRCULATION:  The visualized portions of the distal cervical segments of the internal carotid arteries are symmetric in caliber and well opacified. The petrous segments are normal bilaterally.  Mild calcified atheromatous disease present within the cavernous segment of the left internal carotid artery without hemodynamically significant  stenosis. Supra clinoid left ICA is within normal limits.  On the right, a large wide necked peripherally calcified aneurysm measuring approximately 1.0 (craniocaudad) x 1.1 (AP) x 1.1 (transverse) cm is seen arising from the anterior genu of the cavernous segment of the right ICA (series 5, image 24). The neck of the aneurysm measures approximately 6 x 6 x 6 mm. The aneurysm is directed superiorly and medially. Distally, the supra clinoid segment of the right ICA is within normal limits.  A1 segments are well opacified bilaterally. Anterior communicating artery is normal. Anterior cerebral arteries are well opacified without abnormality.  M1  segments are well opacified bilaterally without proximal branch occlusion or hemodynamically significant stenosis. No aneurysm seen at the MCA bifurcations. Distal MCA branches are symmetric bilaterally.  POSTERIOR CIRCULATION:  The left vertebral artery is dominant. The right vertebral artery appears to terminate in the right posterior inferior cerebellar artery. The posterior inferior cerebellar arteries themselves are symmetric in caliber and normal in appearance. The vertebrobasilar junction and basilar artery are within normal limits. No basilar tip aneurysm. Posterior cerebral arteries are normal. A small left posterior communicating artery is noted. Superior cerebellar arteries are within normal limits. Probable dominant left anterior inferior cerebellar artery.  No aneurysm seen within the posterior circulation.  Review of the MIP images confirms the above findings.  Osseous excrescence measuring approximately 1.1 x 0.6 cm seen at the posterior aspect of the left petrous apex again noted, which may reflect a a calcified meningioma. Addition and exophytic calcification arising from the lateral aspect of the left supra clinoid process measures 1.3 x 0.4 cm, also suspicious for meningioma.  Patchy supratentorial white matter disease again noted. No large vessel territory infarct or hemorrhage. No midline shift or hydrocephalus. No extra-axial fluid collection.  IMPRESSION: 1. 1.0 x 1.1 x 1.1 cm wide necked peripherally calcified aneurysm arising from the anterior genu of the cavernous segment of the right internal carotid artery (para ophthalmic). 2. Stable probable calcified meningiomas as above. 3. No acute intracranial process.   Electronically Signed   By: Jeannine Boga M.D.   On: 08/20/2013 22:10   Dg Chest 2 View  08/20/2013   CLINICAL DATA:  Dizziness, shortness of Breath  EXAM: CHEST  2 VIEW  COMPARISON:  03/10/2013  FINDINGS: Cardiac shadow is within normal limits. The lungs are well aerated  bilaterally. Bony structures are within normal limits.  IMPRESSION: No active cardiopulmonary disease.   Electronically Signed   By: Inez Catalina M.D.   On: 08/20/2013 21:21   Ct Head Wo Contrast  08/20/2013   CLINICAL DATA:  Dizziness and vomiting  EXAM: CT HEAD WITHOUT CONTRAST  TECHNIQUE: Contiguous axial images were obtained from the base of the skull through the vertex without intravenous contrast.  COMPARISON:  None.  FINDINGS: The ventricles and sulci are normal for age. No intraparenchymal hemorrhage, mass effect nor midline shift. Patchy supratentorial white matter hypodensities are within normal range for patient's age and though non-specific suggest sequelae of chronic small vessel ischemic disease. No acute large vascular territory infarcts.  No abnormal extra-axial fluid collections. Basal cisterns are patent. Moderate calcific atherosclerosis of the carotid siphons superimposed brown 10 mm peripherally calcified mass extending from the anterior genu of the right carotid artery. In addition, subcentimeter exophytic calcification from the anterior clinoid on the left and 8 mm bony excrescence from the posterior aspect of left petrous apex which may reflect meningiomas. Cerebellar tonsils at but not below the foramen magnum.  No  skull fracture. The included ocular globes and orbital contents are non-suspicious. The mastoid aircells and included paranasal sinuses are well-aerated.  IMPRESSION: No acute intracranial process.  10 mm peripherally calcified mass extending from the anterior genu of the right carotid artery likely reflects aneurysm and would be better characterized on CT angiogram of the head.  At least 2 subcentimeter probable meningiomas.  Findings discussed with and reconfirmed by Dr.KATHLEEN MCMANUS on7/14/2015at9:05 pm.   Electronically Signed   By: Elon Alas   On: 08/20/2013 21:06   Mm Digital Diagnostic Unilat L  08/08/2013   CLINICAL DATA:  Patient is an asymptomatic  73 year old female who was recalled from screening mammography to further evaluate the left breast  EXAM: DIGITAL DIAGNOSTIC  LEFT MAMMOGRAM WITH CAD  COMPARISON:  Screening mammogram dated 07/24/2013. No additional comparison studies are available at time of reporting.  ACR Breast Density Category b: There are scattered areas of fibroglandular density.  FINDINGS: Spot-compression magnification views of the central breast reveal two groups of microcalcification. The more superior group of calcification demonstrates an area of central lucency and probably benign mammographic features most consistent with evolving dystrophic calcification. The group of microcalcifications inferior and medial to this are amorphous with mild variability and measure approximately 16 x 7 x 11 mm.  Mammographic images were processed with CAD.  IMPRESSION: Microcalcifications in the central left breast are suspicious.  RECOMMENDATION: 1. Recommend stereotactic guided core needle biopsy of the suspicious microcalcifications in the central left breast. 2. Assuming benign pathology from the stereotactic guided needle biopsy, recommend short-term follow-up imaging of the probably benign microcalcifications with left diagnostic mammography in 6 months.  I have discussed the findings and recommendations with the patient. Results were also provided in writing at the conclusion of the visit. If applicable, a reminder letter will be sent to the patient regarding the next appointment.  BI-RADS CATEGORY  4: Suspicious abnormality - biopsy should be considered.   Electronically Signed   By: Andres Shad   On: 08/08/2013 16:33   Mm Digital Screening  07/26/2013   CLINICAL DATA:  Screening.  EXAM: DIGITAL SCREENING BILATERAL MAMMOGRAM WITH CAD  COMPARISON:  Previous Exam(s)  ACR Breast Density Category b: There are scattered areas of fibroglandular density.  FINDINGS: In the left breast, calcifications warrant further evaluation with magnified views.  In the right breast, no findings suspicious for malignancy. Images were processed with CAD.  IMPRESSION: Further evaluation is suggested for calcifications in the left breast.  RECOMMENDATION: Diagnostic mammogram of the left breast. (Code:FI-L-21M)  The patient will be contacted regarding the findings, and additional imaging will be scheduled.  BI-RADS CATEGORY  0: Incomplete. Need additional imaging evaluation and/or prior mammograms for comparison.   Electronically Signed   By: Marin Olp M.D.   On: 07/26/2013 07:58   Mm Lt Breast Bx W Loc Dev 1st Lesion Image Bx Spec Stereo Guide  08/20/2013   CLINICAL DATA:  Stereotactic core needle biopsy for left breast 12 o'clock location/central calcifications  EXAM: Left BREAST STEREOTACTIC CORE NEEDLE BIOPSY  COMPARISON:  Previous exams.  FINDINGS: The patient and I discussed the procedure of stereotactic-guided biopsy including benefits and alternatives. We discussed the high likelihood of a successful procedure. We discussed the risks of the procedure including infection, bleeding, tissue injury, clip migration, and inadequate sampling. Informed written consent was given. The usual time out protocol was performed immediately prior to the procedure.  Using sterile technique and 2% Lidocaine as local anesthetic, under stereotactic guidance,  a 12 gauge core needle VAC device was used to perform core needle biopsy of calcifications in the left breast 12 o'clock/ using a superior to inferior approach. Specimen radiograph was performed showing calcifications in the biopsy samples. Specimens with calcifications are identified for pathology.  At the conclusion of the procedure, a X shaped tissue marker clip was deployed into the biopsy cavity. Follow-up 2-view mammogram confirmed clip placement at the biopsy site.  IMPRESSION: Stereotactic-guided biopsy of left breast 12 o'clock/ X shaped clip placement. Pathology is pending. No apparent complications.   Electronically  Signed   By: Conchita Paris M.D.   On: 08/20/2013 14:08     Assessment & Plan  1. 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 Calcified meningiomas 3. Left breast calcified mass status post biopsy today 4.Hypertension 5. Diabetes mellitus 6. Leukocytosis no obvious source ; follow CBC in a.m. probably due for nausea vomiting and stress  Plan  Discussed with neurosurgery ( by ER attending) Treat symptomatically with IV fluids and Antivert Followup as outpatient with Dr.NanKumar , for evaluation    DVT Prophylaxis Heparin   AM Labs Ordered, also please review Full Orders  Code Status full  Disposition Plan: Home  Time spent in minutes : 33 minutes  Condition GUARDED   @SIGNATURE @

## 2013-08-20 NOTE — ED Notes (Signed)
Pt presents with c/o dizziness, nausea, and vomiting. Pt was diagnosed with vertigo about a week and a half ago. Pt says she was lightheaded all day today and when she tried to take her medicine for vertigo she threw it up. Pt says this is how she usually feels with her vertigo symptoms but she does not usually vomit quite as much. Pt did have a breast biopsy done today and has been very nervous about the results.

## 2013-08-20 NOTE — ED Provider Notes (Signed)
CSN: [id number]     Arrival date & time 08/20/13  1926 History   First MD Initiated Contact with Patient 08/20/13 1945     Chief Complaint  Patient presents with  . Dizziness  . Emesis     HPI Pt was seen at 2000. Per pt, c/o sudden onset and persistence of multiple intermittent episodes of "dizziness" for the past 1 to 2 months. Describes the dizziness as "everything is spinning." Symptoms worsen with movement of her head or body position changes. Pt states she was evaluated at a local [facility] 3 weeks ago for same, dx vertigo, rx antivert. Pt states her symptoms had been improved until this afternoon. States she began to "spin" approximately 1400 today. States she tried to take her antivert but "kept throwing it up." Pt's family states pt was "breathing shallowly" and "said her fingers were tingling" during this episode. Denies CP/palpitations, no SOB/cough, no abd pain, no diarrhea, no black or blood in stools or emesis, no fevers, no falls, no focal motor weakness, no tingling/numbness in extremities.    Past Medical History  Diagnosis Date  . Hypertension   . Diabetes mellitus without complication   . Heart murmur   . Arthritis   . Arthritis of knee   . Vertigo    Past Surgical History  Procedure Laterality Date  . Cesarean section    . Cholecystectomy    . Splenectomy, total    . Breast biopsy      Left   Family History  Problem Relation Age of Onset  . Cancer Mother     kidney  . Heart attack Paternal Grandfather    History  Substance Use Topics  . Smoking status: Former Research scientist (life sciences)  . Smokeless tobacco: Never Used  . Alcohol Use: No    Review of Systems ROS: Statement: All systems negative except as marked or noted in the HPI; Constitutional: Negative for fever and chills. ; ; Eyes: Negative for eye pain, redness and discharge. ; ; ENMT: Negative for ear pain, hoarseness, sore throat. +nasal congestion, sinus pressure. ; ; Cardiovascular: Negative for chest pain,  palpitations, diaphoresis, dyspnea and peripheral edema. ; ; Respiratory: Negative for cough, wheezing and stridor. ; ; Gastrointestinal: +N/V. Negative for diarrhea, abdominal pain, blood in stool, hematemesis, jaundice and rectal bleeding. . ; ; Genitourinary: Negative for dysuria, flank pain and hematuria. ; ; Musculoskeletal: Negative for back pain and neck pain. Negative for swelling and trauma.; ; Skin: Negative for pruritus, rash, abrasions, blisters, bruising and skin lesion.; ; Neuro: +"dizziness," paresthesias. Negative for headache, lightheadedness and neck stiffness. Negative for weakness, altered level of consciousness , altered mental status, extremity weakness, involuntary movement, seizure and syncope.      Allergies  Review of patient's allergies indicates no known allergies.  Home Medications   Prior to Admission medications   Medication Sig Start Date End Date Taking? Authorizing Provider  aspirin 81 MG tablet Take 81 mg by mouth daily.   Yes Historical Provider, MD  glucose blood test strip Use as instructed 07/17/13  Yes Lucille Passy, MD  lisinopril (PRINIVIL,ZESTRIL) 20 MG tablet Take 1 tablet (20 mg total) by mouth daily. 04/16/13  Yes Lucille Passy, MD  meclizine (ANTIVERT) 12.5 MG tablet Take 1 tablet (12.5 mg total) by mouth 3 (three) times daily as needed for dizziness. 08/02/13  Yes Harden Mo, MD  Vitamin D, Ergocalciferol, (DRISDOL) 50000 UNITS CAPS capsule Take 50,000 Units by mouth every 7 (seven) days.  Yes Historical Provider, MD   BP 147/67  Pulse 70  Temp(Src) 98.1 F (36.7 C) (Oral)  Resp 15  Ht 5\' 4"  (1.626 m)  Wt 193 lb (87.544 kg)  BMI 33.11 kg/m2  SpO2 96% Physical Exam 2005: Physical examination:  Nursing notes reviewed; Vital signs and O2 SAT reviewed;  Constitutional: Well developed, Well nourished, Well hydrated, In no acute distress; Head:  Normocephalic, atraumatic; Eyes: EOMI, PERRL, No scleral icterus; ENMT: TM's clear bilat. +edemetous  nasal turbinates bilat with clear rhinorrhea. Mouth and pharynx normal, Mucous membranes moist; Neck: Supple, Full range of motion, No lymphadenopathy; Cardiovascular: Regular rate and rhythm, No gallop; Respiratory: Breath sounds clear & equal bilaterally, No wheezes.  Speaking full sentences with ease, Normal respiratory effort/excursion; Chest: Nontender, Movement normal; Abdomen: Soft, Nontender, Nondistended, Normal bowel sounds; Genitourinary: No CVA tenderness; Extremities: Pulses normal, No tenderness, No edema, No calf edema or asymmetry.; Neuro: AA&Ox3, Major CN grossly intact.Speech clear.  No facial droop. +left horizontal end gaze fatigable nystagmus. Grips equal. Strength 5/5 equal bilat UE's and LE's.  DTR 2/4 equal bilat UE's and LE's.  No gross sensory deficits.  Normal cerebellar testing bilat UE's (finger-nose) and LE's (heel-shin)..; Skin: Color normal, Warm, Dry.   ED Course  Procedures     EKG Interpretation   Date/Time:  Tuesday August 20 2013 20:11:59 EDT Ventricular Rate:  74 PR Interval:  183 QRS Duration: 107 QT Interval:  400 QTC Calculation: 444 R Axis:   25 Text Interpretation:  Sinus rhythm Left atrial enlargement Borderline T  abnormalities, anterior leads When compared with ECG of 02/12/2005 No  significant change was found Confirmed by [facility]  MD, Nunzio Cory [id number])  on 08/20/2013 10:59:37 PM      MDM  MDM Reviewed: previous chart, nursing note and vitals Reviewed previous: labs and ECG Interpretation: labs, ECG, x-ray and CT scan    Results for orders placed during the hospital encounter of 08/20/13  URINALYSIS, ROUTINE W REFLEX MICROSCOPIC      Result Value Ref Range   Color, Urine YELLOW  YELLOW   APPearance CLEAR  CLEAR   Specific Gravity, Urine 1.023  1.005 - 1.030   pH 6.0  5.0 - 8.0   Glucose, UA NEGATIVE  NEGATIVE mg/dL   Hgb urine dipstick NEGATIVE  NEGATIVE   Bilirubin Urine NEGATIVE  NEGATIVE   Ketones, ur 15 (*) NEGATIVE mg/dL    Protein, ur NEGATIVE  NEGATIVE mg/dL   Urobilinogen, UA 1.0  0.0 - 1.0 mg/dL   Nitrite NEGATIVE  NEGATIVE   Leukocytes, UA NEGATIVE  NEGATIVE  CBC WITH DIFFERENTIAL      Result Value Ref Range   WBC 17.3 (*) 4.0 - 10.5 K/uL   RBC 5.35 (*) 3.87 - 5.11 MIL/uL   Hemoglobin 16.4 (*) 12.0 - 15.0 g/dL   HCT 47.9 (*) 36.0 - 46.0 %   MCV 89.5  78.0 - 100.0 fL   MCH 30.7  26.0 - 34.0 pg   MCHC 34.2  30.0 - 36.0 g/dL   RDW 13.7  11.5 - 15.5 %   Platelets 292  150 - 400 K/uL   Neutrophils Relative % 91 (*) 43 - 77 %   Neutro Abs 15.6 (*) 1.7 - 7.7 K/uL   Lymphocytes Relative 6 (*) 12 - 46 %   Lymphs Abs 1.1  0.7 - 4.0 K/uL   Monocytes Relative 3  3 - 12 %   Monocytes Absolute 0.6  0.1 - 1.0 K/uL   Eosinophils  Relative 0  0 - 5 %   Eosinophils Absolute 0.0  0.0 - 0.7 K/uL   Basophils Relative 0  0 - 1 %   Basophils Absolute 0.1  0.0 - 0.1 K/uL  BASIC METABOLIC PANEL      Result Value Ref Range   Sodium 142  137 - 147 mEq/L   Potassium 4.6  3.7 - 5.3 mEq/L   Chloride 103  96 - 112 mEq/L   CO2 24  19 - 32 mEq/L   Glucose, Bld 144 (*) 70 - 99 mg/dL   BUN 17  6 - 23 mg/dL   Creatinine, Ser 1.09  0.50 - 1.10 mg/dL   Calcium 10.1  8.4 - 10.5 mg/dL   GFR calc non Af Amer 49 (*) >90 mL/min   GFR calc Af Amer 57 (*) >90 mL/min   Anion gap 15  5 - 15  TROPONIN I      Result Value Ref Range   Troponin I <0.30  <0.30 ng/mL   Ct Angio Head W/cm &/or Wo Cm 08/20/2013   CLINICAL DATA:  Evaluate for aneurysm.  Follow-up abnormal head CT.  EXAM: CT ANGIOGRAPHY HEAD  TECHNIQUE: Multidetector CT imaging of the head was performed using the standard protocol during bolus administration of intravenous contrast. Multiplanar CT image reconstructions and MIPs were obtained to evaluate the vascular anatomy.  CONTRAST:  176mL OMNIPAQUE IOHEXOL 350 MG/ML SOLN  COMPARISON:  None.  FINDINGS: ANTERIOR CIRCULATION:  The visualized portions of the distal cervical segments of the internal carotid arteries are symmetric  in caliber and well opacified. The petrous segments are normal bilaterally.  Mild calcified atheromatous disease present within the cavernous segment of the left internal carotid artery without hemodynamically significant stenosis. Supra clinoid left ICA is within normal limits.  On the right, a large wide necked peripherally calcified aneurysm measuring approximately 1.0 (craniocaudad) x 1.1 (AP) x 1.1 (transverse) cm is seen arising from the anterior genu of the cavernous segment of the right ICA (series 5, image 24). The neck of the aneurysm measures approximately 6 x 6 x 6 mm. The aneurysm is directed superiorly and medially. Distally, the supra clinoid segment of the right ICA is within normal limits.  A1 segments are well opacified bilaterally. Anterior communicating artery is normal. Anterior cerebral arteries are well opacified without abnormality.  M1 segments are well opacified bilaterally without proximal branch occlusion or hemodynamically significant stenosis. No aneurysm seen at the MCA bifurcations. Distal MCA branches are symmetric bilaterally.  POSTERIOR CIRCULATION:  The left vertebral artery is dominant. The right vertebral artery appears to terminate in the right posterior inferior cerebellar artery. The posterior inferior cerebellar arteries themselves are symmetric in caliber and normal in appearance. The vertebrobasilar junction and basilar artery are within normal limits. No basilar tip aneurysm. Posterior cerebral arteries are normal. A small left posterior communicating artery is noted. Superior cerebellar arteries are within normal limits. Probable dominant left anterior inferior cerebellar artery.  No aneurysm seen within the posterior circulation.  Review of the MIP images confirms the above findings.  Osseous excrescence measuring approximately 1.1 x 0.6 cm seen at the posterior aspect of the left petrous apex again noted, which may reflect a a calcified meningioma. Addition and  exophytic calcification arising from the lateral aspect of the left supra clinoid process measures 1.3 x 0.4 cm, also suspicious for meningioma.  Patchy supratentorial white matter disease again noted. No large vessel territory infarct or hemorrhage. No midline shift or hydrocephalus.  No extra-axial fluid collection.  IMPRESSION: 1. 1.0 x 1.1 x 1.1 cm wide necked peripherally calcified aneurysm arising from the anterior genu of the cavernous segment of the right internal carotid artery (para ophthalmic). 2. Stable probable calcified meningiomas as above. 3. No acute intracranial process.   Electronically Signed   By: Jeannine Boga M.D.   On: 08/20/2013 22:10   Dg Chest 2 View 08/20/2013   CLINICAL DATA:  Dizziness, shortness of Breath  EXAM: CHEST  2 VIEW  COMPARISON:  03/10/2013  FINDINGS: Cardiac shadow is within normal limits. The lungs are well aerated bilaterally. Bony structures are within normal limits.  IMPRESSION: No active cardiopulmonary disease.   Electronically Signed   By: Inez Catalina M.D.   On: 08/20/2013 21:21   Ct Head Wo Contrast 08/20/2013   CLINICAL DATA:  Dizziness and vomiting  EXAM: CT HEAD WITHOUT CONTRAST  TECHNIQUE: Contiguous axial images were obtained from the base of the skull through the vertex without intravenous contrast.  COMPARISON:  None.  FINDINGS: The ventricles and sulci are normal for age. No intraparenchymal hemorrhage, mass effect nor midline shift. Patchy supratentorial white matter hypodensities are within normal range for patient's age and though non-specific suggest sequelae of chronic small vessel ischemic disease. No acute large vascular territory infarcts.  No abnormal extra-axial fluid collections. Basal cisterns are patent. Moderate calcific atherosclerosis of the carotid siphons superimposed brown 10 mm peripherally calcified mass extending from the anterior genu of the right carotid artery. In addition, subcentimeter exophytic calcification from the  anterior clinoid on the left and 8 mm bony excrescence from the posterior aspect of left petrous apex which may reflect meningiomas. Cerebellar tonsils at but not below the foramen magnum.  No skull fracture. The included ocular globes and orbital contents are non-suspicious. The mastoid aircells and included paranasal sinuses are well-aerated.  IMPRESSION: No acute intracranial process.  10 mm peripherally calcified mass extending from the anterior genu of the right carotid artery likely reflects aneurysm and would be better characterized on CT angiogram of the head.  At least 2 subcentimeter probable meningiomas.  Findings discussed with and reconfirmed by Dr.Madell Heino on7/14/2015at9:05 pm.   Electronically Signed   By: Elon Alas   On: 08/20/2013 21:06    2230:  Pt states she "feels a little better" after zofran and antivert. No longer nauseated. States she can now lay on the stretcher "without spinning." When attempted to walk, pt c/o increasing dizziness. Neuro exam remains unchanged. Dx and testing d/w pt and family.  Questions answered.  Verb understanding, agreeable to admit.  2235:  T/C to [facility] Dr. Laren Everts, case discussed, including:  HPI, pertinent PM/SHx, VS/PE, dx testing, ED course and treatment:  Agreeable to admit, requests to write temporary orders, obtain tele bed to team 8.  T/C to Neurosurgery Dr. Hal Neer, case discussed, including:  HPI, pertinent PM/SHx, VS/PE, dx testing, ED course and treatment:  Not an acute surgical issue at this time, after she is discharged from the hospital have pt f/u with Dr. Kathyrn Sheriff for further evaluation/treatment. [facility] MD and pt upated.       Alfonzo Feller, DO 08/22/13 1504

## 2013-08-20 NOTE — ED Notes (Signed)
Pt taken off oxygen by MD during assessment. Pts oxygen level was 88% when entering room. RN made aware.

## 2013-08-20 NOTE — ED Notes (Signed)
Bed: WA01 Expected date:  Expected time:  Means of arrival:  Comments: EMS-N/V

## 2013-08-20 NOTE — ED Notes (Signed)
Pt ambulated to restroom stating she was very dizzy and weak.

## 2013-08-21 DIAGNOSIS — R42 Dizziness and giddiness: Secondary | ICD-10-CM

## 2013-08-21 DIAGNOSIS — D429 Neoplasm of uncertain behavior of meninges, unspecified: Secondary | ICD-10-CM

## 2013-08-21 DIAGNOSIS — I671 Cerebral aneurysm, nonruptured: Secondary | ICD-10-CM

## 2013-08-21 DIAGNOSIS — D72829 Elevated white blood cell count, unspecified: Secondary | ICD-10-CM

## 2013-08-21 LAB — BASIC METABOLIC PANEL
Anion gap: 11 (ref 5–15)
BUN: 15 mg/dL (ref 6–23)
CO2: 27 mEq/L (ref 19–32)
Calcium: 9.2 mg/dL (ref 8.4–10.5)
Chloride: 104 mEq/L (ref 96–112)
Creatinine, Ser: 1.17 mg/dL — ABNORMAL HIGH (ref 0.50–1.10)
GFR calc Af Amer: 52 mL/min — ABNORMAL LOW (ref >90)
GFR calc non Af Amer: 45 mL/min — ABNORMAL LOW (ref >90)
Glucose, Bld: 101 mg/dL — ABNORMAL HIGH (ref 70–99)
Potassium: 4 mEq/L (ref 3.7–5.3)
Sodium: 142 mEq/L (ref 137–147)

## 2013-08-21 LAB — CBC
HCT: 43.4 % (ref 36.0–46.0)
Hemoglobin: 14.2 g/dL (ref 12.0–15.0)
MCH: 29.4 pg (ref 26.0–34.0)
MCHC: 32.7 g/dL (ref 30.0–36.0)
MCV: 89.9 fL (ref 78.0–100.0)
Platelets: 291 10*3/uL (ref 150–400)
RBC: 4.83 MIL/uL (ref 3.87–5.11)
RDW: 13.9 % (ref 11.5–15.5)
WBC: 13.7 10*3/uL — ABNORMAL HIGH (ref 4.0–10.5)

## 2013-08-21 LAB — GLUCOSE, CAPILLARY
Glucose-Capillary: 123 mg/dL — ABNORMAL HIGH (ref 70–99)
Glucose-Capillary: 125 mg/dL — ABNORMAL HIGH (ref 70–99)

## 2013-08-21 LAB — HEMOGLOBIN A1C
Hgb A1c MFr Bld: 6.4 % — ABNORMAL HIGH (ref <5.7)
Mean Plasma Glucose: 137 mg/dL — ABNORMAL HIGH (ref <117)

## 2013-08-21 MED ORDER — SODIUM CHLORIDE 0.45 % IV SOLN
INTRAVENOUS | Status: DC
  Administered 2013-08-21: 01:00:00 via INTRAVENOUS

## 2013-08-21 MED ORDER — HYDROCODONE-ACETAMINOPHEN 5-325 MG PO TABS: 1.0000 | ORAL_TABLET | ORAL | Status: DC | PRN

## 2013-08-21 MED ORDER — ACETAMINOPHEN 325 MG PO TABS: 650.0000 mg | ORAL_TABLET | Freq: Four times a day (QID) | ORAL | Status: DC | PRN

## 2013-08-21 MED ORDER — INSULIN ASPART 100 UNIT/ML ~~LOC~~ SOLN: 0.0000 [IU] | Freq: Every day | SUBCUTANEOUS | Status: DC

## 2013-08-21 MED ORDER — MECLIZINE HCL 25 MG PO TABS
25.0000 mg | ORAL_TABLET | Freq: Three times a day (TID) | ORAL | Status: DC | PRN
  Filled 2013-08-21: qty 1

## 2013-08-21 MED ORDER — INSULIN ASPART 100 UNIT/ML ~~LOC~~ SOLN
0.0000 [IU] | Freq: Three times a day (TID) | SUBCUTANEOUS | Status: DC
  Administered 2013-08-21: 1 [IU] via SUBCUTANEOUS

## 2013-08-21 MED ORDER — ACETAMINOPHEN 650 MG RE SUPP: 650.0000 mg | Freq: Four times a day (QID) | RECTAL | Status: DC | PRN

## 2013-08-21 MED ORDER — SODIUM CHLORIDE 0.9 % IJ SOLN: 3.0000 mL | Freq: Two times a day (BID) | INTRAMUSCULAR | Status: DC

## 2013-08-21 MED ORDER — LISINOPRIL 20 MG PO TABS
20.0000 mg | ORAL_TABLET | Freq: Every day | ORAL | Status: DC
  Administered 2013-08-21: 20 mg via ORAL
  Filled 2013-08-21: qty 1

## 2013-08-21 MED ORDER — ONDANSETRON 4 MG PO TBDP: 4.0000 mg | ORAL_TABLET | Freq: Three times a day (TID) | ORAL | Status: DC | PRN

## 2013-08-21 MED ORDER — HEPARIN SODIUM (PORCINE) 5000 UNIT/ML IJ SOLN
5000.0000 [IU] | Freq: Three times a day (TID) | INTRAMUSCULAR | Status: DC
  Administered 2013-08-21 (×2): 5000 [IU] via SUBCUTANEOUS
  Filled 2013-08-21 (×5): qty 1

## 2013-08-21 NOTE — Progress Notes (Signed)
Pt arrived to rm 1404 from ED via stretcher; A/O x 4, in no apparent distress; states dizziness present but improved; oriented to treatment team, room/floor, safety plan; bed alarm on; call bell within reach; family at bedside; pt and family updated on plan of care ... Blair Hailey, RN

## 2013-08-21 NOTE — Discharge Summary (Signed)
Physician Discharge Summary  Briana Ochoa JSE:[id number] DOB: [birth year] DOA: 08/20/2013  PCP: Arnette Norris, MD  Admit date: 08/20/2013 Discharge date: 08/21/2013  Recommendations for Outpatient Follow-up:  1. F/u with Dr. Kathyrn Sheriff ASAP for evaluation of meningiomas and intracranial right ICA aneurysm 2. Consideration of MRI brain 3. PCP to consider referral for PT/OT if vertigo recurs.  Recommended low salt diet in case this reflects Meniere's  Discharge Diagnoses:  Principal Problem:   Vertigo possibly due to BPPV, meniere's.   Active Problems:   HTN (hypertension)   Diabetes   Right internal carotid artery aneurysm   Meningioma, multiple   Leukocytosis, unspecified   Discharge Condition: stable, improved  Diet recommendation: low salt, diabetic  Wt Readings from Last 3 Encounters:  08/21/13 87.9 kg (193 lb 12.6 oz)  07/17/13 87.544 kg (193 lb)  04/16/13 90.039 kg (198 lb 8 oz)    History of present illness:   Briana Ochoa is a 73 y.o. female, with past medical history significant for hypertension, diabetes mellitus and arthritis presenting today for recurrent episodes of vertigo for the last 2-3 months. Her episode today was associated with nausea and vomiting and was severe. Patient went to an urgent care center on 6/26 and she was given Antivert. Her symptoms improved slightly but then started worsening again until today when she had the worst episode. Patient denies any headaches or visual symptoms denies dizziness or loss of consciousness. No previous history of intracerebral bleed   Hospital Course:   Dizziness/vertigo, severe initially. Differential diagnosis included stroke, brain metastases, BPPV, Mnire's. She was started on meclizine and IV fluids and a clear liquid diet. She underwent CT angiogram head which did not demonstrate any evidence of stroke, however she had multiple small meningiomas and a 1 x 1.1 x 1.1 cm right ICA aneurysm originating from the  anterior ten-year the cavernous segment.  These findings were discussed with neurosurgery who recommended followup with them in clinic in a few weeks. Her symptoms resolved spontaneously. Given the fact that her symptoms have been present on and off for the last month, and it seems less likely that this is secondary to a stroke, however a small stroke may not be visible on CT. Domenici have an outpatient MRI to evaluate for possible brain metastases particularly if it turns out that she has malignancy of the left breast, or small stroke.  Otherwise, her primary care doctor can consider referring her for treatment of BPPV. I have advised her to use a low-salt diet in case her symptoms reflect early Mnire's disease, which would explain her tinnitus in her mild hearing loss.    Right ICA aneurysm and multiple calcified meningiomas. Followup with neurosurgery.  Left breast mass status post biopsy in 7/14. [name] has followup arranged to review her biopsy results.  Hypertension, blood pressure well controlled.  Continue lisinopril.  Type 2 diabetes mellitus, CBGs were well controlled.  Continue diet control.    Leukocytosis, improved with IVF and likely related to stress from vomiting.  Remained afebrile.  Consultants:  Dr. Kathyrn Sheriff, neurosurgery, by phone Procedures:  Left breast biopsy on 7/14  CT head 7/14  CT angio head 7/14  CXR Antibiotics:  none   Discharge Exam: Filed Vitals:   08/21/13 0611  BP: 131/63  Pulse: 69  Temp: 98.8 F (37.1 C)  Resp: 18   Filed Vitals:   08/20/13 2230 08/20/13 2330 08/21/13 0011 08/21/13 0611  BP:  139/71 131/57 131/63  Pulse: 70 71 74 69  Temp:   97.5 F (36.4 C) 98.8 F (37.1 C)  TempSrc:   Oral Oral  Resp: 15 16 18 18   Height:   5\' 4"  (1.626 m)   Weight:   87.9 kg (193 lb 12.6 oz)   SpO2: 96% 99% 95% 97%    General: CF, No acute distress, mild HOH HEENT: NCAT, MMM  Cardiovascular: RRR, nl S1, S2 no mrg, 2+ pulses, warm extremities   Respiratory: CTAB, no increased WOB  Abdomen: NABS, soft, NT/ND  MSK: Normal tone and bulk, no LEE Neuro: CN II-XII grossly intact except for mild bilateral hearing loss, no nystagmus.  Strength 5/5, sensation intact to light touch, no dysmetria this AM   Discharge Instructions      Discharge Instructions   Call MD for:  difficulty breathing, headache or visual disturbances    Complete by:  As directed      Call MD for:  extreme fatigue    Complete by:  As directed      Call MD for:  hives    Complete by:  As directed      Call MD for:  persistant dizziness or light-headedness    Complete by:  As directed      Call MD for:  persistant nausea and vomiting    Complete by:  As directed      Call MD for:  severe uncontrolled pain    Complete by:  As directed      Call MD for:  temperature >100.4    Complete by:  As directed      Diet - low sodium heart healthy    Complete by:  As directed      Discharge instructions    Complete by:  As directed   You were hospitalized with nausea, vomiting, and dizziness and were found to have some meningiomas and an aneurysm of the right neck.  Please follow up with Dr. Kathyrn Sheriff within the next few weeks to discuss these findings.  If you continue to have episodes of dizziness, I would recommend an MRI of the brain.  Your primary care doctor may refer your for treatment of possible inner ear stones.  In the meantime, please eat a low salt diet which can be helpful if you have Meniere's disease.  Please read through the information provided about these conditions.  I have given you a prescription for some nausea medication in addition to your meclizine in case your dizziness recurs.     Driving Restrictions    Complete by:  As directed   No driving or operating heavy machinery until cleared by your primary care doctor     Increase activity slowly    Complete by:  As directed             Medication List         aspirin 81 MG tablet  Take 81  mg by mouth daily.     glucose blood test strip  Use as instructed     lisinopril 20 MG tablet  Commonly known as:  PRINIVIL,ZESTRIL  Take 1 tablet (20 mg total) by mouth daily.     meclizine 12.5 MG tablet  Commonly known as:  ANTIVERT  Take 1 tablet (12.5 mg total) by mouth 3 (three) times daily as needed for dizziness.     ondansetron 4 MG disintegrating tablet  Commonly known as:  ZOFRAN ODT  Take 1 tablet (4 mg total) by mouth every 8 (eight) hours as needed  for nausea or vomiting.     Vitamin D (Ergocalciferol) 50000 UNITS Caps capsule  Commonly known as:  DRISDOL  Take 50,000 Units by mouth every 7 (seven) days.       Follow-up Information   Schedule an appointment as soon as possible for a visit with Arnette Norris, MD. (f/u biopsy results)    Specialty:  Family Medicine   Contact information:   [place] [address] [phone]       Follow up with Consuella Lose, C, MD. Schedule an appointment as soon as possible for a visit in 2 weeks. (Neurosurgery )    Specialty:  Neurosurgery   Contact information:   [address], [address] [place] [address] [phone]        The results of significant diagnostics from this hospitalization (including imaging, microbiology, ancillary and laboratory) are listed below for reference.    Significant Diagnostic Studies: Ct Angio Head W/cm &/or Wo Cm  08/20/2013   CLINICAL DATA:  Evaluate for aneurysm.  Follow-up abnormal head CT.  EXAM: CT ANGIOGRAPHY HEAD  TECHNIQUE: Multidetector CT imaging of the head was performed using the standard protocol during bolus administration of intravenous contrast. Multiplanar CT image reconstructions and MIPs were obtained to evaluate the vascular anatomy.  CONTRAST:  151mL OMNIPAQUE IOHEXOL 350 MG/ML SOLN  COMPARISON:  None.  FINDINGS: ANTERIOR CIRCULATION:  The visualized portions of the distal cervical segments of the internal carotid arteries are symmetric in  caliber and well opacified. The petrous segments are normal bilaterally.  Mild calcified atheromatous disease present within the cavernous segment of the left internal carotid artery without hemodynamically significant stenosis. Supra clinoid left ICA is within normal limits.  On the right, a large wide necked peripherally calcified aneurysm measuring approximately 1.0 (craniocaudad) x 1.1 (AP) x 1.1 (transverse) cm is seen arising from the anterior genu of the cavernous segment of the right ICA (series 5, image 24). The neck of the aneurysm measures approximately 6 x 6 x 6 mm. The aneurysm is directed superiorly and medially. Distally, the supra clinoid segment of the right ICA is within normal limits.  A1 segments are well opacified bilaterally. Anterior communicating artery is normal. Anterior cerebral arteries are well opacified without abnormality.  M1 segments are well opacified bilaterally without proximal branch occlusion or hemodynamically significant stenosis. No aneurysm seen at the MCA bifurcations. Distal MCA branches are symmetric bilaterally.  POSTERIOR CIRCULATION:  The left vertebral artery is dominant. The right vertebral artery appears to terminate in the right posterior inferior cerebellar artery. The posterior inferior cerebellar arteries themselves are symmetric in caliber and normal in appearance. The vertebrobasilar junction and basilar artery are within normal limits. No basilar tip aneurysm. Posterior cerebral arteries are normal. A small left posterior communicating artery is noted. Superior cerebellar arteries are within normal limits. Probable dominant left anterior inferior cerebellar artery.  No aneurysm seen within the posterior circulation.  Review of the MIP images confirms the above findings.  Osseous excrescence measuring approximately 1.1 x 0.6 cm seen at the posterior aspect of the left petrous apex again noted, which may reflect a a calcified meningioma. Addition and exophytic  calcification arising from the lateral aspect of the left supra clinoid process measures 1.3 x 0.4 cm, also suspicious for meningioma.  Patchy supratentorial white matter disease again noted. No large vessel territory infarct or hemorrhage. No midline shift or hydrocephalus. No extra-axial fluid collection.  IMPRESSION: 1. 1.0 x 1.1 x 1.1 cm wide  necked peripherally calcified aneurysm arising from the anterior genu of the cavernous segment of the right internal carotid artery (para ophthalmic). 2. Stable probable calcified meningiomas as above. 3. No acute intracranial process.   Electronically Signed   By: Jeannine Boga M.D.   On: 08/20/2013 22:10   Dg Chest 2 View  08/20/2013   CLINICAL DATA:  Dizziness, shortness of Breath  EXAM: CHEST  2 VIEW  COMPARISON:  03/10/2013  FINDINGS: Cardiac shadow is within normal limits. The lungs are well aerated bilaterally. Bony structures are within normal limits.  IMPRESSION: No active cardiopulmonary disease.   Electronically Signed   By: Inez Catalina M.D.   On: 08/20/2013 21:21   Ct Head Wo Contrast  08/20/2013   CLINICAL DATA:  Dizziness and vomiting  EXAM: CT HEAD WITHOUT CONTRAST  TECHNIQUE: Contiguous axial images were obtained from the base of the skull through the vertex without intravenous contrast.  COMPARISON:  None.  FINDINGS: The ventricles and sulci are normal for age. No intraparenchymal hemorrhage, mass effect nor midline shift. Patchy supratentorial white matter hypodensities are within normal range for patient's age and though non-specific suggest sequelae of chronic small vessel ischemic disease. No acute large vascular territory infarcts.  No abnormal extra-axial fluid collections. Basal cisterns are patent. Moderate calcific atherosclerosis of the carotid siphons superimposed brown 10 mm peripherally calcified mass extending from the anterior genu of the right carotid artery. In addition, subcentimeter exophytic calcification from the anterior  clinoid on the left and 8 mm bony excrescence from the posterior aspect of left petrous apex which may reflect meningiomas. Cerebellar tonsils at but not below the foramen magnum.  No skull fracture. The included ocular globes and orbital contents are non-suspicious. The mastoid aircells and included paranasal sinuses are well-aerated.  IMPRESSION: No acute intracranial process.  10 mm peripherally calcified mass extending from the anterior genu of the right carotid artery likely reflects aneurysm and would be better characterized on CT angiogram of the head.  At least 2 subcentimeter probable meningiomas.  Findings discussed with and reconfirmed by Dr.KATHLEEN MCMANUS on7/14/2015at9:05 pm.   Electronically Signed   By: Elon Alas   On: 08/20/2013 21:06   Mm Digital Diagnostic Unilat L  08/08/2013   CLINICAL DATA:  Patient is an asymptomatic 73 year old female who was recalled from screening mammography to further evaluate the left breast  EXAM: DIGITAL DIAGNOSTIC  LEFT MAMMOGRAM WITH CAD  COMPARISON:  Screening mammogram dated 07/24/2013. No additional comparison studies are available at time of reporting.  ACR Breast Density Category b: There are scattered areas of fibroglandular density.  FINDINGS: Spot-compression magnification views of the central breast reveal two groups of microcalcification. The more superior group of calcification demonstrates an area of central lucency and probably benign mammographic features most consistent with evolving dystrophic calcification. The group of microcalcifications inferior and medial to this are amorphous with mild variability and measure approximately 16 x 7 x 11 mm.  Mammographic images were processed with CAD.  IMPRESSION: Microcalcifications in the central left breast are suspicious.  RECOMMENDATION: 1. Recommend stereotactic guided core needle biopsy of the suspicious microcalcifications in the central left breast. 2. Assuming benign pathology from the  stereotactic guided needle biopsy, recommend Amedeo Detweiler-term follow-up imaging of the probably benign microcalcifications with left diagnostic mammography in 6 months.  I have discussed the findings and recommendations with the patient. Results were also provided in writing at the conclusion of the visit. If applicable, a reminder letter will be sent to the  patient regarding the next appointment.  BI-RADS CATEGORY  4: Suspicious abnormality - biopsy should be considered.   Electronically Signed   By: Andres Shad   On: 08/08/2013 16:33   Mm Digital Screening  07/26/2013   CLINICAL DATA:  Screening.  EXAM: DIGITAL SCREENING BILATERAL MAMMOGRAM WITH CAD  COMPARISON:  Previous Exam(s)  ACR Breast Density Category b: There are scattered areas of fibroglandular density.  FINDINGS: In the left breast, calcifications warrant further evaluation with magnified views. In the right breast, no findings suspicious for malignancy. Images were processed with CAD.  IMPRESSION: Further evaluation is suggested for calcifications in the left breast.  RECOMMENDATION: Diagnostic mammogram of the left breast. (Code:FI-L-62M)  The patient will be contacted regarding the findings, and additional imaging will be scheduled.  BI-RADS CATEGORY  0: Incomplete. Need additional imaging evaluation and/or prior mammograms for comparison.   Electronically Signed   By: Marin Olp M.D.   On: 07/26/2013 07:58   Mm Lt Breast Bx W Loc Dev 1st Lesion Image Bx Spec Stereo Guide  08/20/2013   CLINICAL DATA:  Stereotactic core needle biopsy for left breast 12 o'clock location/central calcifications  EXAM: Left BREAST STEREOTACTIC CORE NEEDLE BIOPSY  COMPARISON:  Previous exams.  FINDINGS: The patient and I discussed the procedure of stereotactic-guided biopsy including benefits and alternatives. We discussed the high likelihood of a successful procedure. We discussed the risks of the procedure including infection, bleeding, tissue injury, clip  migration, and inadequate sampling. Informed written consent was given. The usual time out protocol was performed immediately prior to the procedure.  Using sterile technique and 2% Lidocaine as local anesthetic, under stereotactic guidance, a 12 gauge core needle VAC device was used to perform core needle biopsy of calcifications in the left breast 12 o'clock/ using a superior to inferior approach. Specimen radiograph was performed showing calcifications in the biopsy samples. Specimens with calcifications are identified for pathology.  At the conclusion of the procedure, a X shaped tissue marker clip was deployed into the biopsy cavity. Follow-up 2-view mammogram confirmed clip placement at the biopsy site.  IMPRESSION: Stereotactic-guided biopsy of left breast 12 o'clock/ X shaped clip placement. Pathology is pending. No apparent complications.   Electronically Signed   By: Conchita Paris M.D.   On: 08/20/2013 14:08    Microbiology: No results found for this or any previous visit (from the past 240 hour(s)).   Labs: Basic Metabolic Panel:  Recent Labs Lab 08/20/13 2026 08/21/13 0410  NA 142 142  K 4.6 4.0  CL 103 104  CO2 24 27  GLUCOSE 144* 101*  BUN 17 15  CREATININE 1.09 1.17*  CALCIUM 10.1 9.2   Liver Function Tests: No results found for this basename: AST, ALT, ALKPHOS, BILITOT, PROT, ALBUMIN,  in the last 168 hours No results found for this basename: LIPASE, AMYLASE,  in the last 168 hours No results found for this basename: AMMONIA,  in the last 168 hours CBC:  Recent Labs Lab 08/20/13 2026 08/21/13 0410  WBC 17.3* 13.7*  NEUTROABS 15.6*  --   HGB 16.4* 14.2  HCT 47.9* 43.4  MCV 89.5 89.9  PLT 292 291   Cardiac Enzymes:  Recent Labs Lab 08/20/13 2026  TROPONINI <0.30   BNP: BNP (last 3 results) No results found for this basename: PROBNP,  in the last 8760 hours CBG:  Recent Labs Lab 08/21/13 0045 08/21/13 0729  GLUCAP 125* 123*    Time coordinating  discharge: 35 minutes  Signed:  Aylani Spurlock  [facility] 08/21/2013, 9:28 AM

## 2013-08-22 ENCOUNTER — Telehealth: Payer: Self-pay | Admitting: Family Medicine

## 2013-08-22 ENCOUNTER — Other Ambulatory Visit: Payer: Self-pay | Admitting: Family Medicine

## 2013-08-22 ENCOUNTER — Telehealth: Payer: Self-pay | Admitting: *Deleted

## 2013-08-22 DIAGNOSIS — C50112 Malignant neoplasm of central portion of left female breast: Secondary | ICD-10-CM

## 2013-08-22 DIAGNOSIS — D0512 Intraductal carcinoma in situ of left breast: Secondary | ICD-10-CM | POA: Insufficient documentation

## 2013-08-22 DIAGNOSIS — C50919 Malignant neoplasm of unspecified site of unspecified female breast: Secondary | ICD-10-CM

## 2013-08-22 LAB — URINE CULTURE: Colony Count: 50000

## 2013-08-22 NOTE — Telephone Encounter (Signed)
Confirmed [facility] for 08/28/13 at 1230 .  Instructions and contact information given.  Informed patient to ask for Wilma and inform her she is here for the breast clinic.  Patient verbalized understanding.

## 2013-08-22 NOTE — Telephone Encounter (Signed)
Transitional Care Call.  Pt d/c'd from [facility] on 08/20/13.  D/C dx: multiple meningiomas and right internal carotid artery aneurysm.  Spoke with pt.  She states that she is feeling much better since d/c home.  She denies nausea, vomiting, or dizziness.  She has not had to take meclizine since d/c.  She is able to ambulate and perform ADL's independently.  Her daughter lives in the home and can assist her as needed.  Appetite is good.  Denies questions regarding d/c instructions or med list.  Denies questions for Dr. Deborra Medina.  Hospital follow up scheduled for 08/29/13.

## 2013-08-26 ENCOUNTER — Ambulatory Visit: Payer: Commercial Managed Care - HMO | Admitting: Family Medicine

## 2013-08-27 ENCOUNTER — Ambulatory Visit
Admission: RE | Admit: 2013-08-27 | Discharge: 2013-08-27 | Disposition: A | Discharge status: Home / Self Care | Payer: Commercial Managed Care - HMO | Source: Ambulatory Visit | Attending: Family Medicine | Admitting: Family Medicine

## 2013-08-27 DIAGNOSIS — C50919 Malignant neoplasm of unspecified site of unspecified female breast: Secondary | ICD-10-CM

## 2013-08-27 MED ORDER — GADOBENATE DIMEGLUMINE 529 MG/ML IV SOLN
18.0000 mL | Freq: Once | INTRAVENOUS | Status: AC | PRN
  Administered 2013-08-27: 18 mL via INTRAVENOUS

## 2013-08-28 ENCOUNTER — Encounter (INDEPENDENT_AMBULATORY_CARE_PROVIDER_SITE_OTHER): Payer: Self-pay | Admitting: General Surgery

## 2013-08-28 ENCOUNTER — Other Ambulatory Visit: Payer: Self-pay | Admitting: Family Medicine

## 2013-08-28 ENCOUNTER — Encounter: Payer: Self-pay | Admitting: General Practice

## 2013-08-28 ENCOUNTER — Ambulatory Visit: Payer: Medicare HMO | Admitting: Physical Therapy

## 2013-08-28 ENCOUNTER — Telehealth (INDEPENDENT_AMBULATORY_CARE_PROVIDER_SITE_OTHER): Payer: Self-pay

## 2013-08-28 ENCOUNTER — Encounter: Payer: Self-pay | Admitting: Radiation Oncology

## 2013-08-28 ENCOUNTER — Ambulatory Visit (HOSPITAL_BASED_OUTPATIENT_CLINIC_OR_DEPARTMENT_OTHER): Payer: Commercial Managed Care - HMO | Admitting: General Surgery

## 2013-08-28 ENCOUNTER — Ambulatory Visit
Admission: RE | Admit: 2013-08-28 | Discharge: 2013-08-28 | Disposition: A | Discharge status: Home / Self Care | Payer: Medicare HMO | Source: Ambulatory Visit | Attending: Radiation Oncology | Admitting: Radiation Oncology

## 2013-08-28 VITALS — BP 142/71 | HR 93 | Temp 98.6°F | Wt 191.0 lb

## 2013-08-28 DIAGNOSIS — C50112 Malignant neoplasm of central portion of left female breast: Secondary | ICD-10-CM

## 2013-08-28 DIAGNOSIS — C50119 Malignant neoplasm of central portion of unspecified female breast: Secondary | ICD-10-CM

## 2013-08-28 DIAGNOSIS — R928 Other abnormal and inconclusive findings on diagnostic imaging of breast: Secondary | ICD-10-CM

## 2013-08-28 NOTE — Patient Instructions (Signed)
Your recent mammograms show a 2 cm area of calcifications in the left breast deep to the nipple and areola. Biopsy of this area shows ductal carcinoma in situ. Hormone receptors are strongly positive.  Imaging studies also show a second area of calcifications in the left breast more superiorly and more posteriorly located. You are scheduled for biopsy of this area as well on July 29.  We have discussed the surgical options of lumpectomy and mastectomy. We have discussed the role of radiation therapy and antiestrogen therapy in your care.  Return to see Dr. Dalbert Batman 3-4 days after the second biopsy. My office will coordinate this with you. We will discuss and schedule your definitive breast cancer surgery at that time.  You will be referred to a medical oncologist postoperatively  Be sure to keep your appointment with the neurovascular surgeon tomorrow to discuss management of your carotid artery aneurysm.     Lumpectomy A lumpectomy is a form of "breast conserving" or "breast preservation" surgery. It may also be referred to as a partial mastectomy. During a lumpectomy, the portion of the breast that contains the cancerous tumor or breast mass (the lump) is removed. Some normal tissue around the lump may also be removed to make sure all the tumor has been removed. This surgery should take 40 minutes or less. LET [facility] CARE PROVIDER KNOW ABOUT:  Any allergies you have.  All medicines you are taking, including vitamins, herbs, eye drops, creams, and over-the-counter medicines.  Previous problems you or members of your family have had with the use of anesthetics.  Any blood disorders you have.  Previous surgeries you have had.  Medical conditions you have. RISKS AND COMPLICATIONS Generally, this is a safe procedure. However, as with any procedure, complications can occur. Possible complications include:  Bleeding.  Infection.  Pain.  Temporary swelling.  Change in the shape  of the breast, particularly if a large portion is removed. BEFORE THE PROCEDURE  Ask your health care provider about changing or stopping your regular medicines.  Do not eat or drink anything for 7-8 hours before the surgery or as directed by your health care provider. Ask your health care provider if you can take a sip of water with any approved medicines.  On the day of surgery, your healthcare provider will use a mammogram or ultrasound to locate and mark the tumor in your breast. These markings on your breast will show where the cut (incision) will be made. PROCEDURE   An IV tube will be put into one of your veins.  You may be given medicine to help you relax before the surgery (sedative). You will be given one of the following:  A medicine that numbs the area (local anesthesia).  A medicine that makes you go to sleep (general anesthesia).  Your health care provider will use a kind of electric scalpel that uses heat to minimize bleeding (electrocautery knife).  A curved incision (like a smile or frown) that follows the natural curve of your breast is made, to allow for minimal scarring and better healing.  The tumor will be removed with some of the surrounding tissue. This will be sent to the lab for analysis. Your health care provider may also remove your lymph nodes at this time if needed.  Sometimes, but not always, a rubber tube called a drain will be surgically inserted into your breast area or armpit to collect excess fluid that may accumulate in the space where the tumor was. This  drain is connected to a plastic bulb on the outside of your body. This drain creates suction to help remove the fluid.  The incisions will be closed with stitches (sutures).  A bandage may be placed over the incisions. AFTER THE PROCEDURE  You will be taken to the recovery area.  You will be given medicine for pain.  A small rubber drain may be placed in the breast for 2-3 days to prevent a  collection of blood (hematoma) from developing in the breast. You will be given instructions on caring for the drain before you go home.  A pressure bandage (dressing) will be applied for 1-2 days to prevent bleeding. Ask your health care provider how to care for your bandage at home. Document Released: 03/07/2006 Document Revised: 09/26/2012 Document Reviewed: 06/29/2012 [facility] Patient Information 2015 [place], [place]. This information is not intended to replace advice given to you by your health care provider. Make sure you discuss any questions you have with your health care provider.    Mastectomy, With or Without Reconstruction Mastectomy (removal of the breast) is a procedure most commonly used to treat cancer (tumor) of the breast. Different procedures are available for treatment. This depends on the stage of the tumor (abnormal growths). Discuss this with your caregiver, surgeon (a specialist for performing operations such as this), or oncologist (someone specialized in the treatment of cancer). With proper information, you can decide which treatment is best for you. Although the sound of the word cancer is frightening to all of [place], the new treatments and medications can be a source of reassurance and comfort. If there are things you are worried about, discuss them with your caregiver. He or she can help comfort you and your family. Some of the different procedures for treating breast cancer are:  Radical (extensive) mastectomy. This is an operation used to remove the entire breast, the muscles under the breast, and all of the glands (lymph nodes) under the arm. With all of the new treatments available for cancer of the breast, this procedure has become less common.  Modified radical mastectomy. This is a similar operation to the radical mastectomy described above. In the modified radical mastectomy, the muscles of the chest wall are not removed unless one of the lessor muscles is removed. One  of the lessor muscles may be removed to allow better removal of the lymph nodes. The axillary lymph nodes are also removed. Rarely, during an axillary node dissection nerves to this area are damaged. Radiation therapy is then often used to the area following this surgery.  A total mastectomy also known as a complete or simple mastectomy. It involves removal of only the breast. The lymph nodes and the muscles are left in place.  In a lumpectomy, the lump is removed from the breast. This is the simplest form of surgical treatment. A sentinel lymph node biopsy may also be done. Additional treatment may be required. RISKS AND COMPLICATIONS The main problems that follow removal of the breast include:  Infection (germs start growing in the wound). This can usually be treated with antibiotics (medications that kill germs).  Lymphedema. This means the arm on the side of the breast that was operated on swells because the lymph (tissue fluid) cannot follow the main channels back into the body. This only occurs when the lymph nodes have had to be removed under the arm.  There may be some areas of numbness to the upper arm and around the incision (cut by the surgeon)  in the breast. This happens because of the cutting of or damage to some of the nerves in the area. This is most often unavoidable.  There may be difficulty moving the arm in a full range of motion (moving in all directions) following surgery. This usually improves with time following use and exercise.  Recurrence of breast cancer may happen with the very best of surgery and follow up treatment. Sometimes small cancer cells that cannot be seen with the naked eye have already spread at the time of surgery. When this happens other treatment is available. This treatment may be radiation, medications or a combination of both. RECONSTRUCTION Reconstruction of the breast may be done immediately if there is not going to be post-operative radiation. This  surgery is done for cosmetic (improve appearance) purposes to improve the physical appearance after the operation. This may be done in two ways:  It can be done using a saline filled prosthetic (an artificial breast which is filled with salt water). Silicone breast implants are now re-approved by the FDA and are being commonly used.  Reconstruction can be done using the body's own muscle/fat/skin. Your caregiver will discuss your options with you. Depending upon your needs or choice, together you will be able to determine which procedure is best for you. Document Released: 10/19/2000 Document Revised: 10/19/2011 Document Reviewed: 06/12/2007 [facility] Patient Information 2015 [place], [place]. This information is not intended to replace advice given to you by your health care provider. Make sure you discuss any questions you have with your health care provider.

## 2013-08-28 NOTE — Progress Notes (Signed)
Patient ID: Briana Ochoa, female   DOB: 03-28-1940, 73 y.o.   MRN: [id number]  No chief complaint on file.   HPI Briana Ochoa is a 73 y.o. female.  She is referred by Dr. Conchita Paris at the breast center of [facility] for evaluation and management of ductal carcinoma of the left breast, central location. Dr. Arnette Norris is her PCP. She was evaluated in the [facility]  today by Dr. Pablo Ledger and me.   The patient has no prior history of breast problems. She went for screening mammograms and there was a 16 x 7 x 11 mm area of suspicious calcifications in the left breast centrally, deep to the nipple and areola. This area was biopsied and showed what appears to be intermediate grade DCIS, ER +100%, PR-positive 100%.         A second area of calcifications was noted superior and deep to the index lesion. They spanned 6 mm. A biopsy of this area scheduled for July 29.         MRI showed both of these areas, and the primary cancer is felt to span 2 cm by MRI.  2 benign fibroadenomas were seen on the right side.  Family history reveals breast cancer in maternal aunt and paternal aunt. No ovarian cancer. She is a widow but has 4 children and is healthy.  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  She recently developed vertigo and has been told she has a carotid aneurysm (cavernous segment) and is going to see one of our neurovascular surgeons tomorrow. CT Angio head also shows stable, probable calcified meningiomas.  She feels fine and is in no distress today. HPI  Past Medical History  Diagnosis Date  . Hypertension   . Diabetes mellitus without complication   . Heart murmur   . Arthritis   . Arthritis of knee   . Vertigo     Past Surgical History  Procedure Laterality Date  . Cesarean section    . Cholecystectomy    . Splenectomy, total    . Breast biopsy      Left    Family History  Problem Relation Age of Onset   . Cancer Mother     kidney  . Heart attack Paternal Grandfather   . Breast cancer Maternal Aunt   . Breast cancer Paternal Aunt     Social History History  Substance Use Topics  . Smoking status: Former Research scientist (life sciences)  . Smokeless tobacco: Never Used  . Alcohol Use: No    No Known Allergies  Current Outpatient Prescriptions  Medication Sig Dispense Refill  . aspirin 81 MG tablet Take 81 mg by mouth daily.      [name] glucose blood test strip Use as instructed  100 each  12  . lisinopril (PRINIVIL,ZESTRIL) 20 MG tablet Take 1 tablet (20 mg total) by mouth daily.  90 tablet  3  . meclizine (ANTIVERT) 12.5 MG tablet Take 1 tablet (12.5 mg total) by mouth 3 (three) times daily as needed for dizziness.  30 tablet  0  . ondansetron (ZOFRAN ODT) 4 MG disintegrating tablet Take 1 tablet (4 mg total) by mouth every 8 (eight) hours as needed for nausea or vomiting.  20 tablet  0  . Vitamin D, Ergocalciferol, (DRISDOL) 50000 UNITS CAPS capsule Take 50,000 Units by mouth every 7 (seven) days.       No current facility-administered medications for this visit.    Review of Systems  Review of Systems  Constitutional: Negative for fever, chills and unexpected weight change.  HENT: Negative for congestion, hearing loss, sore throat, trouble swallowing and voice change.   Eyes: Negative for visual disturbance.  Respiratory: Negative for cough and wheezing.   Cardiovascular: Negative for chest pain, palpitations and leg swelling.  Gastrointestinal: Negative for nausea, vomiting, abdominal pain, diarrhea, constipation, blood in stool, abdominal distention and anal bleeding.  Genitourinary: Negative for hematuria, vaginal bleeding and difficulty urinating.  Musculoskeletal: Negative for arthralgias.  Skin: Negative for rash and wound.  Neurological: Positive for dizziness and light-headedness. Negative for seizures, syncope and headaches.  Hematological: Negative for adenopathy. Does not bruise/bleed easily.   Psychiatric/Behavioral: Negative for confusion.    There were no vitals taken for this visit.  Physical Exam Physical Exam  Constitutional: She is oriented to person, place, and time. She appears well-developed and well-nourished. No distress.  Very pleasant. Oriented. Good insight. One of her female friends was present throughout the encounter.  HENT:  Head: Normocephalic and atraumatic.  Nose: Nose normal.  Mouth/Throat: No oropharyngeal exudate.  Eyes: Conjunctivae and EOM are normal. Pupils are equal, round, and reactive to light. Left eye exhibits no discharge. No scleral icterus.  Neck: Neck supple. No JVD present. No tracheal deviation present. No thyromegaly present.  Cardiovascular: Normal rate, regular rhythm, normal heart sounds and intact distal pulses.   No murmur heard. Pulmonary/Chest: Effort normal and breath sounds normal. No respiratory distress. She has no wheezes. She has no rales. She exhibits no tenderness.  The vague area of fullness left breast, 12:00 position, above areolar margin consistent with post biopsy hematoma. No other skin changes or mass in either breast. No axillary adenopathy on either side.  Abdominal: Soft. Bowel sounds are normal. She exhibits no distension and no mass. There is no tenderness. There is no rebound and no guarding.  Musculoskeletal: She exhibits no edema and no tenderness.  Lymphadenopathy:    She has no cervical adenopathy.  Neurological: She is alert and oriented to person, place, and time. She exhibits normal muscle tone. Coordination normal.  Skin: Skin is warm. No rash noted. She is not diaphoretic. No erythema. No pallor.  Psychiatric: She has a normal mood and affect. Her behavior is normal. Judgment and thought content normal.    Data Reviewed All imaging studies. Histology. Breast diagnostic protocol. I discussed her case at breast conference this morning. I have coordinated her treatment plan with Dr.  Pablo Ledger.  Assessment    Ductal carcinoma in situ left breast, 2 cm span,  receptor positive, central location  Suspicious calcifications left breast, superior and posterior. Biopsy pending  Fibroadenomas right breast  Diagnosed carotid aneurysm, cavernous segment, neurovascular evaluation pending tomorrow  Hypertension  Diet-controlled diabetes  Remote history splenectomy  Remote history of open cholecystectomy  Remote history C-section  Family history breast cancer in maternal aunt and paternal aunt.     Plan    We had a long talk. At this point in time, from a surgical standpoint, she is a candidate for lobectomy or mastectomy. Even if both areas showed DCIS, they're only 5 cm apart and probably can consider a double wire localized lumpectomy, although there would be some mild volume loss. She knows that mastectomy is equivalent in terms of long-term control but probably does not confer a survival benefit  to lumpectomy and radiation therapy. She is thinkng about this but is somewhat motivated for breast conservation.  She will be evaluated by the neurovascular surgeon  tomorrow to discuss management of her carotid aneurysm.  Before proceeding with breast surgery we will need to have a clear understanding as to what her stroke risk is.  She is scheduled for biopsy of the second left breast lesion on July 29  She will return to see me 2-4 days after the second biopsy to discuss final surgical recommendations and decisions regarding definitive breast surgery.  She'll be at referred to a medical oncologist postop to discuss the role of antiestrogen therapy and long-term surveillance issues.       Edsel Petrin. Dalbert Batman, M.D., [facility] General and Minimally invasive Surgery Breast and Colorectal Surgery Office:   [phone] Pager:   [phone]  08/28/2013, 3:33 PM

## 2013-08-28 NOTE — Telephone Encounter (Signed)
AVS mailed to pt per Dr Darrel Hoover request.

## 2013-08-28 NOTE — Progress Notes (Signed)
Radiation Oncology         [phone]) [phone] ________________________________  Initial outpatient Consultation - Date: 08/28/2013   Name: Briana Ochoa MRN: [id number]   DOB: 1941/01/02  REFERRING PHYSICIAN: [name], MD  DIAGNOSIS: DCIS of the left breast  STAGE: Cancer of central portion of left female breast   Primary site: Breast (Left)   Staging method: AJCC 7th Edition   Clinical: Stage 0 (Tis (DCIS), N0, cM0)   Summary: Stage 0 (Tis (DCIS), N0, cM0)   Clinical comments: Staged at breast conference 7.22.15  HISTORY OF PRESENT ILLNESS::Briana Ochoa is a 73 y.o. female  Who was found to have calcifications in the left breast on screening mammogram. These measured about a centimeter and were biopsied. These showed low to intermediate grade DCIS which was ER+PR+. A second area of calcifications was noted to be subtly changed from prior about 4 cm posterior to these calcifications on review and a second biopsy was recommended. MRI showed a 1.6x0.7 x 1.1 cm area of post biopsy change and a fibroadenoma in the right breast. No abnormal appearing lymph nodes. She recovered well from her biopsy. She is accompanied by a friend today who is a long term breast cancer survivor treated her in 12. Sh is GxP4 with with her last period in 1997 and menses at 12. Her first live birth was at 48. She is referred today for my opinion regarding radiation in the management of her disease. Her complaint is a recent diagnosis of vertigo and finding of a carotid aneurysm for which she is scheduled to see neurosurgery tomorrow.   PREVIOUS RADIATION THERAPY: No  PAST MEDICAL HISTORY:  has a past medical history of Hypertension; Diabetes mellitus without complication; Heart murmur; Arthritis; Arthritis of knee; and Vertigo.    PAST SURGICAL HISTORY: Past Surgical History  Procedure Laterality Date  . Cesarean section    . Cholecystectomy    . Splenectomy, total    . Breast biopsy      Left      FAMILY HISTORY:  Family History  Problem Relation Age of Onset  . Cancer Mother     kidney  . Heart attack Paternal Grandfather   . Breast cancer Maternal Aunt   . Breast cancer Paternal Aunt     SOCIAL HISTORY:  History  Substance Use Topics  . Smoking status: Former Research scientist (life sciences)  . Smokeless tobacco: Never Used  . Alcohol Use: No    ALLERGIES: Review of patient's allergies indicates no known allergies.  MEDICATIONS:  Current Outpatient Prescriptions  Medication Sig Dispense Refill  . aspirin 81 MG tablet Take 81 mg by mouth daily.      [name] glucose blood test strip Use as instructed  100 each  12  . lisinopril (PRINIVIL,ZESTRIL) 20 MG tablet Take 1 tablet (20 mg total) by mouth daily.  90 tablet  3  . meclizine (ANTIVERT) 12.5 MG tablet Take 1 tablet (12.5 mg total) by mouth 3 (three) times daily as needed for dizziness.  30 tablet  0  . ondansetron (ZOFRAN ODT) 4 MG disintegrating tablet Take 1 tablet (4 mg total) by mouth every 8 (eight) hours as needed for nausea or vomiting.  20 tablet  0  . Vitamin D, Ergocalciferol, (DRISDOL) 50000 UNITS CAPS capsule Take 50,000 Units by mouth every 7 (seven) days.       No current facility-administered medications for this encounter.    REVIEW OF SYSTEMS:  A 15 point review of systems is  documented in the electronic medical record. This was obtained by the nursing staff. However, I reviewed this with the patient to discuss relevant findings and make appropriate changes.  Pertinent items are noted in HPI.  PHYSICAL EXAM:  Filed Vitals:   08/28/13 1300  BP: 142/71  Pulse: 93  Temp: 98.6 F (37 C)  .191 lb (86.637 kg). Pleasant female. No distress. Significant bruising but no palpable mass or hematoma in the left breast. No palpable abnormalities of the right breast.   LABORATORY DATA:  Lab Results  Component Value Date   WBC 13.7* 08/21/2013   HGB 14.2 08/21/2013   HCT 43.4 08/21/2013   MCV 89.9 08/21/2013   PLT 291 08/21/2013    Lab Results  Component Value Date   NA 142 08/21/2013   K 4.0 08/21/2013   CL 104 08/21/2013   CO2 27 08/21/2013   Lab Results  Component Value Date   ALT 10 07/17/2013   AST 17 07/17/2013   ALKPHOS 68 07/17/2013   BILITOT 0.6 07/17/2013     RADIOGRAPHY: Ct Angio Head W/cm &/or Wo Cm  08/20/2013   CLINICAL DATA:  Evaluate for aneurysm.  Follow-up abnormal head CT.  EXAM: CT ANGIOGRAPHY HEAD  TECHNIQUE: Multidetector CT imaging of the head was performed using the standard protocol during bolus administration of intravenous contrast. Multiplanar CT image reconstructions and MIPs were obtained to evaluate the vascular anatomy.  CONTRAST:  135mL OMNIPAQUE IOHEXOL 350 MG/ML SOLN  COMPARISON:  None.  FINDINGS: ANTERIOR CIRCULATION:  The visualized portions of the distal cervical segments of the internal carotid arteries are symmetric in caliber and well opacified. The petrous segments are normal bilaterally.  Mild calcified atheromatous disease present within the cavernous segment of the left internal carotid artery without hemodynamically significant stenosis. Supra clinoid left ICA is within normal limits.  On the right, a large wide necked peripherally calcified aneurysm measuring approximately 1.0 (craniocaudad) x 1.1 (AP) x 1.1 (transverse) cm is seen arising from the anterior genu of the cavernous segment of the right ICA (series 5, image 24). The neck of the aneurysm measures approximately 6 x 6 x 6 mm. The aneurysm is directed superiorly and medially. Distally, the supra clinoid segment of the right ICA is within normal limits.  A1 segments are well opacified bilaterally. Anterior communicating artery is normal. Anterior cerebral arteries are well opacified without abnormality.  M1 segments are well opacified bilaterally without proximal branch occlusion or hemodynamically significant stenosis. No aneurysm seen at the MCA bifurcations. Distal MCA branches are symmetric bilaterally.  POSTERIOR  CIRCULATION:  The left vertebral artery is dominant. The right vertebral artery appears to terminate in the right posterior inferior cerebellar artery. The posterior inferior cerebellar arteries themselves are symmetric in caliber and normal in appearance. The vertebrobasilar junction and basilar artery are within normal limits. No basilar tip aneurysm. Posterior cerebral arteries are normal. A small left posterior communicating artery is noted. Superior cerebellar arteries are within normal limits. Probable dominant left anterior inferior cerebellar artery.  No aneurysm seen within the posterior circulation.  Review of the MIP images confirms the above findings.  Osseous excrescence measuring approximately 1.1 x 0.6 cm seen at the posterior aspect of the left petrous apex again noted, which may reflect a a calcified meningioma. Addition and exophytic calcification arising from the lateral aspect of the left supra clinoid process measures 1.3 x 0.4 cm, also suspicious for meningioma.  Patchy supratentorial white matter disease again noted. No large vessel territory infarct  or hemorrhage. No midline shift or hydrocephalus. No extra-axial fluid collection.  IMPRESSION: 1. 1.0 x 1.1 x 1.1 cm wide necked peripherally calcified aneurysm arising from the anterior genu of the cavernous segment of the right internal carotid artery (para ophthalmic). 2. Stable probable calcified meningiomas as above. 3. No acute intracranial process.   Electronically Signed   By: Jeannine Boga M.D.   On: 08/20/2013 22:10   Dg Chest 2 View  08/20/2013   CLINICAL DATA:  Dizziness, shortness of Breath  EXAM: CHEST  2 VIEW  COMPARISON:  03/10/2013  FINDINGS: Cardiac shadow is within normal limits. The lungs are well aerated bilaterally. Bony structures are within normal limits.  IMPRESSION: No active cardiopulmonary disease.   Electronically Signed   By: Inez Catalina M.D.   On: 08/20/2013 21:21   Ct Head Wo Contrast  08/20/2013    CLINICAL DATA:  Dizziness and vomiting  EXAM: CT HEAD WITHOUT CONTRAST  TECHNIQUE: Contiguous axial images were obtained from the base of the skull through the vertex without intravenous contrast.  COMPARISON:  None.  FINDINGS: The ventricles and sulci are normal for age. No intraparenchymal hemorrhage, mass effect nor midline shift. Patchy supratentorial white matter hypodensities are within normal range for patient's age and though non-specific suggest sequelae of chronic small vessel ischemic disease. No acute large vascular territory infarcts.  No abnormal extra-axial fluid collections. Basal cisterns are patent. Moderate calcific atherosclerosis of the carotid siphons superimposed brown 10 mm peripherally calcified mass extending from the anterior genu of the right carotid artery. In addition, subcentimeter exophytic calcification from the anterior clinoid on the left and 8 mm bony excrescence from the posterior aspect of left petrous apex which may reflect meningiomas. Cerebellar tonsils at but not below the foramen magnum.  No skull fracture. The included ocular globes and orbital contents are non-suspicious. The mastoid aircells and included paranasal sinuses are well-aerated.  IMPRESSION: No acute intracranial process.  10 mm peripherally calcified mass extending from the anterior genu of the right carotid artery likely reflects aneurysm and would be better characterized on CT angiogram of the head.  At least 2 subcentimeter probable meningiomas.  Findings discussed with and reconfirmed by Dr.KATHLEEN MCMANUS on7/14/2015at9:05 pm.   Electronically Signed   By: Elon Alas   On: 08/20/2013 21:06   Mr Breast Bilateral W Wo Contrast  08/28/2013   ADDENDUM REPORT: 08/28/2013 08:25  ADDENDUM: CORRECTED REPORT:  Under "RECOMMENDATION "  The report should read --- left breast stereotactic core biopsy of the second more superiorly and posteriorly LOCATED group of calcifications as discussed above.    Electronically Signed   By: Luberta Robertson M.D.   On: 08/28/2013 08:25   08/28/2013   CLINICAL DATA:  Known left breast DCIS on stereotactic core biopsy. Preoperative evaluation.  EXAM: BILATERAL BREAST MRI WITH AND WITHOUT CONTRAST  TECHNIQUE: Multiplanar, multisequence MR images of both breasts were obtained prior to and following the intravenous administration of 18 ml of MultiHance  THREE-DIMENSIONAL MR IMAGE RENDERING ON INDEPENDENT WORKSTATION:  Three-dimensional MR images were rendered by post-processing of the original MR data on an independent workstation. The three-dimensional MR images were interpreted, and findings are reported in the following complete MRI report for this study. Three dimensional images were evaluated at the independent DynaCad workstation  COMPARISON:  Mammograms dated 08/20/2013, 08/08/2013, 07/24/2013. Older mammograms are not available comparison at this time.  FINDINGS: Breast composition: b.  Scattered fibroglandular tissue.  Background parenchymal enhancement: Moderate  Right breast: There  is a 6 mm oval, circumscribed, enhancing mass with central calcifications located within the lateral subareolar portion of the right breast. When correlating the MR scan with the recent mammograms this is consistent with a fibroadenoma with involutional calcification. Located slightly posterior and medial to this nodule is a similar smaller (5 mm) oval, circumscribed enhancing mass with central calcification also consistent with a second small involutional fibroadenoma. There are no areas of worrisome enhancement within the right breast.  Left breast: There are post biopsy changes present within the central superior left breast in the area of the patient's recent stereotactic biopsy. There is no worrisome enhancement within the left breast. The area of DCIS is better sized by the mammogram. The group of calcifications in this region span 2 cm. In addition, on the mammogram, there is a second  group of calcifications more superiorly and posteriorly located which span 6 mm. As the patient now has known DCIS, I recommend stereotactic core biopsy of this second group of calcifications. This will be scheduled.  Lymph nodes: No abnormal appearing lymph nodes.  Ancillary findings:  None.  IMPRESSION: 1. Post biopsy change within the central superior left breast in the area the patient's known DCIS. This area of DCIS is better sized by mammography. The calcifications span 2 cm. As the patient has known DCIS, I recommend stereotactic core biopsy of the smaller more superiorly and posteriorly located group calcifications located within the left breast. This will be scheduled. 2. 2 small, circumscribed enhancing subareolar masses located within the right breast most consistent with fibroadenomas undergoing involution. 3. No evidence for adenopathy and no additional findings.  RECOMMENDATION: Left breast stereotactic core biopsy of the second more superiorly and posteriorly low Katie group of calcifications as discussed above.  BI-RADS CATEGORY  4: Suspicious.  Electronically Signed: By: Luberta Robertson M.D. On: 08/27/2013 12:24   Mm Digital Diagnostic Unilat L  08/08/2013   CLINICAL DATA:  Patient is an asymptomatic 73 year old female who was recalled from screening mammography to further evaluate the left breast  EXAM: DIGITAL DIAGNOSTIC  LEFT MAMMOGRAM WITH CAD  COMPARISON:  Screening mammogram dated 07/24/2013. No additional comparison studies are available at time of reporting.  ACR Breast Density Category b: There are scattered areas of fibroglandular density.  FINDINGS: Spot-compression magnification views of the central breast reveal two groups of microcalcification. The more superior group of calcification demonstrates an area of central lucency and probably benign mammographic features most consistent with evolving dystrophic calcification. The group of microcalcifications inferior and medial to this are  amorphous with mild variability and measure approximately 16 x 7 x 11 mm.  Mammographic images were processed with CAD.  IMPRESSION: Microcalcifications in the central left breast are suspicious.  RECOMMENDATION: 1. Recommend stereotactic guided core needle biopsy of the suspicious microcalcifications in the central left breast. 2. Assuming benign pathology from the stereotactic guided needle biopsy, recommend short-term follow-up imaging of the probably benign microcalcifications with left diagnostic mammography in 6 months.  I have discussed the findings and recommendations with the patient. Results were also provided in writing at the conclusion of the visit. If applicable, a reminder letter will be sent to the patient regarding the next appointment.  BI-RADS CATEGORY  4: Suspicious abnormality - biopsy should be considered.   Electronically Signed   By: Andres Shad   On: 08/08/2013 16:33   Mm Lt Breast Bx W Loc Dev 1st Lesion Image Bx Spec Stereo Guide  08/22/2013   ADDENDUM REPORT: 08/22/2013 09:48  ADDENDUM: Pathology revealed ductal carcinoma in situ with calcifications in the left breast. This was found to be concordant by Dr. Conchita Paris. Pathology was discussed with the patient by telephone and her questions were answered. Post biopsy instructions and care were reviewed. The patient reported doing well after the biopsy. She has been scheduled for a bilateral breast MRI on August 27, 2013 at [place] at [address] She has an appointment at the [facility] on August 28, 2013. She was encouraged to come to [facility] for educational materials. My number was provided to her for future questions and concerns.  Pathology results reported by Susa Raring RN, BSN on August 22, 2013.   Electronically Signed   By: Conchita Paris M.D.   On: 08/22/2013 09:48   08/22/2013   CLINICAL DATA:  Stereotactic core needle biopsy for left  breast 12 o'clock location/central calcifications  EXAM: Left BREAST STEREOTACTIC CORE NEEDLE BIOPSY  COMPARISON:  Previous exams.  FINDINGS: The patient and I discussed the procedure of stereotactic-guided biopsy including benefits and alternatives. We discussed the high likelihood of a successful procedure. We discussed the risks of the procedure including infection, bleeding, tissue injury, clip migration, and inadequate sampling. Informed written consent was given. The usual time out protocol was performed immediately prior to the procedure.  Using sterile technique and 2% Lidocaine as local anesthetic, under stereotactic guidance, a 12 gauge core needle VAC device was used to perform core needle biopsy of calcifications in the left breast 12 o'clock/ using a superior to inferior approach. Specimen radiograph was performed showing calcifications in the biopsy samples. Specimens with calcifications are identified for pathology.  At the conclusion of the procedure, a X shaped tissue marker clip was deployed into the biopsy cavity. Follow-up 2-view mammogram confirmed clip placement at the biopsy site.  IMPRESSION: Stereotactic-guided biopsy of left breast 12 o'clock/ X shaped clip placement. Pathology is pending. No apparent complications.  Electronically Signed: By: Conchita Paris M.D. On: 08/20/2013 14:08      IMPRESSION: DCIS of the left breast  PLAN: A biopsy of the second area of calcifications is scheduled for 7/29. Even if these are positive, I think a reasonable lumpectomy could be performed and she would be a candidate for breast conservation.  We discussed the equivalency in terms of survival between mastectomy and lumpectomy.  Although she is older and has likely low grade disease, she is in excellent health and has a family history of longevity.  I think it is reasonable to assume she could live 15-20 years in time to experience a local recurrence. We discussed the role of radiation in decreasing  local failures in patients who undergo lumpectomy. We discussed the process of simulation and the placement tattoos. We discussed 4-6 weeks of treatment as an outpatient. We discussed the possibility of asymptomatic lung damage and the use of breath hold technique for cardiac sparing if necessary. We discussed the low likelihood of secondary malignancies. We discussed the possible side effects including but not limited to skin redness, fatigue, permanent skin darkening, and breast swelling.  We discussed the possibility of antiestrogen treatment and she will be referred to medical oncology to discuss.  I spent 60 minutes  face to face with the patient and more than 50% of that time was spent in counseling and/or coordination of care.   ------------------------------------------------  Thea Silversmith, MD

## 2013-08-28 NOTE — Progress Notes (Signed)
Ms Briana Ochoa reports very strong support from a group of women friends at church [facility]) and she brought her friend Posey Pronto (retired Therapist, sports, cancer survivor, and former [place] pt) for support at breast clinic.  Per pt, even with carotid aneurysm dx, "I feel really good."  She is optimistic and very appreciative of [place] support services.  [place], [place]

## 2013-08-29 ENCOUNTER — Ambulatory Visit: Payer: Commercial Managed Care - HMO | Admitting: Family Medicine

## 2013-08-29 ENCOUNTER — Encounter: Payer: Self-pay | Admitting: *Deleted

## 2013-08-29 NOTE — Progress Notes (Signed)
Faxed care plan to Dr. Deborra Medina at [phone].

## 2013-08-30 ENCOUNTER — Encounter (HOSPITAL_COMMUNITY): Payer: Self-pay | Admitting: Pharmacy Technician

## 2013-08-30 ENCOUNTER — Other Ambulatory Visit (HOSPITAL_COMMUNITY): Payer: Self-pay | Admitting: Neurosurgery

## 2013-08-30 ENCOUNTER — Other Ambulatory Visit: Payer: Self-pay | Admitting: Neurosurgery

## 2013-08-30 DIAGNOSIS — I729 Aneurysm of unspecified site: Secondary | ICD-10-CM

## 2013-09-02 ENCOUNTER — Other Ambulatory Visit: Payer: Self-pay | Admitting: Radiology

## 2013-09-03 ENCOUNTER — Ambulatory Visit (HOSPITAL_COMMUNITY): Admission: RE | Admit: 2013-09-03 | Payer: Medicare HMO | Source: Ambulatory Visit

## 2013-09-03 ENCOUNTER — Telehealth: Payer: Self-pay | Admitting: *Deleted

## 2013-09-03 NOTE — Telephone Encounter (Signed)
Left vm for pt to return call regarding [place] from 08/28/13.

## 2013-09-04 ENCOUNTER — Ambulatory Visit
Admission: RE | Admit: 2013-09-04 | Discharge: 2013-09-04 | Disposition: A | Discharge status: Home / Self Care | Payer: Commercial Managed Care - HMO | Source: Ambulatory Visit | Attending: Family Medicine | Admitting: Family Medicine

## 2013-09-04 DIAGNOSIS — R928 Other abnormal and inconclusive findings on diagnostic imaging of breast: Secondary | ICD-10-CM

## 2013-09-05 ENCOUNTER — Ambulatory Visit (HOSPITAL_COMMUNITY)
Admission: RE | Admit: 2013-09-05 | Discharge: 2013-09-05 | Disposition: A | Discharge status: Home / Self Care | Payer: Medicare HMO | Source: Ambulatory Visit | Attending: Neurosurgery | Admitting: Neurosurgery

## 2013-09-05 ENCOUNTER — Other Ambulatory Visit (HOSPITAL_COMMUNITY): Payer: Self-pay | Admitting: Neurosurgery

## 2013-09-05 DIAGNOSIS — E119 Type 2 diabetes mellitus without complications: Secondary | ICD-10-CM | POA: Insufficient documentation

## 2013-09-05 DIAGNOSIS — I729 Aneurysm of unspecified site: Secondary | ICD-10-CM

## 2013-09-05 DIAGNOSIS — I671 Cerebral aneurysm, nonruptured: Secondary | ICD-10-CM | POA: Diagnosis present

## 2013-09-05 DIAGNOSIS — Z7982 Long term (current) use of aspirin: Secondary | ICD-10-CM | POA: Insufficient documentation

## 2013-09-05 DIAGNOSIS — Z87891 Personal history of nicotine dependence: Secondary | ICD-10-CM | POA: Diagnosis not present

## 2013-09-05 DIAGNOSIS — I10 Essential (primary) hypertension: Secondary | ICD-10-CM | POA: Diagnosis not present

## 2013-09-05 LAB — POCT I-STAT, CHEM 8
BUN: 24 mg/dL — ABNORMAL HIGH (ref 6–23)
Calcium, Ion: 1.3 mmol/L (ref 1.13–1.30)
Chloride: 107 mEq/L (ref 96–112)
Creatinine, Ser: 1.3 mg/dL — ABNORMAL HIGH (ref 0.50–1.10)
Glucose, Bld: 131 mg/dL — ABNORMAL HIGH (ref 70–99)
HCT: 49 % — ABNORMAL HIGH (ref 36.0–46.0)
Hemoglobin: 16.7 g/dL — ABNORMAL HIGH (ref 12.0–15.0)
Potassium: 4.2 mEq/L (ref 3.7–5.3)
Sodium: 143 mEq/L (ref 137–147)
TCO2: 22 mmol/L (ref 0–100)

## 2013-09-05 LAB — GLUCOSE, CAPILLARY: Glucose-Capillary: 109 mg/dL — ABNORMAL HIGH (ref 70–99)

## 2013-09-05 MED ORDER — MIDAZOLAM HCL 2 MG/2ML IJ SOLN
INTRAMUSCULAR | Status: AC | PRN
  Administered 2013-09-05: 0.5 mg via INTRAVENOUS

## 2013-09-05 MED ORDER — MIDAZOLAM HCL 2 MG/2ML IJ SOLN
INTRAMUSCULAR | Status: AC
  Filled 2013-09-05: qty 2

## 2013-09-05 MED ORDER — FENTANYL CITRATE 0.05 MG/ML IJ SOLN
INTRAMUSCULAR | Status: AC
  Filled 2013-09-05: qty 2

## 2013-09-05 MED ORDER — HYDROCODONE-ACETAMINOPHEN 5-325 MG PO TABS: 1.0000 | ORAL_TABLET | ORAL | Status: DC | PRN

## 2013-09-05 MED ORDER — FENTANYL CITRATE 0.05 MG/ML IJ SOLN
INTRAMUSCULAR | Status: AC | PRN
  Administered 2013-09-05: 25 ug via INTRAVENOUS

## 2013-09-05 MED ORDER — HEPARIN SODIUM (PORCINE) 1000 UNIT/ML IJ SOLN
INTRAMUSCULAR | Status: AC | PRN
  Administered 2013-09-05: 2000 [IU] via INTRAVENOUS

## 2013-09-05 MED ORDER — IOHEXOL 300 MG/ML  SOLN
150.0000 mL | Freq: Once | INTRAMUSCULAR | Status: AC | PRN
  Administered 2013-09-05: 60 mL via INTRA_ARTERIAL

## 2013-09-05 NOTE — H&P (Signed)
  CC:  Aneurysm  HPI: Briana Ochoa is a 73 y.o. female presenting for diagnostic angiogram after undergoing CTA of the head which demonstrated a right ICA aneurysm. CT was initially done for recurrent episodes of vertigo.  PMH: Past Medical History  Diagnosis Date  . Hypertension   . Diabetes mellitus without complication   . Heart murmur   . Arthritis   . Arthritis of knee   . Vertigo     PSH: Past Surgical History  Procedure Laterality Date  . Cesarean section    . Cholecystectomy    . Splenectomy, total    . Breast biopsy      Left    SH: History  Substance Use Topics  . Smoking status: Former Research scientist (life sciences)  . Smokeless tobacco: Never Used  . Alcohol Use: No    MEDS: Prior to Admission medications   Medication Sig Start Date End Date Taking? Authorizing Provider  ACCU-CHEK SOFTCLIX LANCETS lancets 1 each daily as needed (to stick finger).  07/17/13  Yes Historical Provider, MD  aspirin 81 MG tablet Take 81 mg by mouth daily.   Yes Historical Provider, MD  glucose blood test strip Use as instructed 07/17/13  Yes Lucille Passy, MD  lisinopril (PRINIVIL,ZESTRIL) 20 MG tablet Take 1 tablet (20 mg total) by mouth daily. 04/16/13  Yes Lucille Passy, MD  Vitamin D, Ergocalciferol, (DRISDOL) 50000 UNITS CAPS capsule Take 50,000 Units by mouth every 7 (seven) days.   Yes Historical Provider, MD  meclizine (ANTIVERT) 12.5 MG tablet Take 1 tablet (12.5 mg total) by mouth 3 (three) times daily as needed for dizziness. 08/02/13   Harden Mo, MD  ondansetron (ZOFRAN ODT) 4 MG disintegrating tablet Take 1 tablet (4 mg total) by mouth every 8 (eight) hours as needed for nausea or vomiting. 08/21/13   Janece Canterbury, MD    ALLERGY: No Known Allergies  ROS: ROS  NEUROLOGIC EXAM: Awake, alert, oriented Memory and concentration grossly intact Speech fluent, appropriate CN grossly intact Motor exam: Upper Extremities Deltoid Bicep Tricep Grip  Right 5/5 5/5 5/5 5/5  Left 5/5  5/5 5/5 5/5   Lower Extremity IP Quad PF DF EHL  Right 5/5 5/5 5/5 5/5 5/5  Left 5/5 5/5 5/5 5/5 5/5   Sensation grossly intact to LT  IMGAING: CTA demonstrates calcified 1.1cm right paraophthalmic ICA aneurysm  IMPRESSION: - 73 y.o. female with incidental RICA aneurysm for diagnostic angio  PLAN: - Diagnostic angio - Likely home post-procedure

## 2013-09-05 NOTE — Discharge Instructions (Signed)

## 2013-09-05 NOTE — Progress Notes (Signed)
PREOP DX: RICA aneursym  POSTOP DX: Same  PROCEDURE: Diagnostic cerebral angiogram  SURGEON: Dr. Consuella Lose, MD  ANESTHESIA: IV Sedation with Local  EBL: Minimal  SPECIMENS: None  COMPLICATIONS: None  CONDITION: Stable to recovery  FINDINGS: 1. 64mm x 6mm x 93mm paraophthalmic RICA aneursym projects superiorly 2. Fetal-type supply of the superior cerebellum from the petrous RICA is incidentally noted 3. Infundibular origin of the left Pcom and AChor aa.  4. 5Fr ExoSeal closure

## 2013-09-09 ENCOUNTER — Ambulatory Visit (INDEPENDENT_AMBULATORY_CARE_PROVIDER_SITE_OTHER): Payer: Commercial Managed Care - HMO | Admitting: Family Medicine

## 2013-09-09 VITALS — BP 140/78 | HR 72 | Temp 98.0°F | Wt 189.8 lb

## 2013-09-09 DIAGNOSIS — R42 Dizziness and giddiness: Secondary | ICD-10-CM

## 2013-09-09 DIAGNOSIS — C50119 Malignant neoplasm of central portion of unspecified female breast: Secondary | ICD-10-CM

## 2013-09-09 DIAGNOSIS — C50112 Malignant neoplasm of central portion of left female breast: Secondary | ICD-10-CM

## 2013-09-09 DIAGNOSIS — D429 Neoplasm of uncertain behavior of meninges, unspecified: Secondary | ICD-10-CM

## 2013-09-09 DIAGNOSIS — I671 Cerebral aneurysm, nonruptured: Secondary | ICD-10-CM

## 2013-09-09 DIAGNOSIS — E089 Diabetes mellitus due to underlying condition without complications: Secondary | ICD-10-CM

## 2013-09-09 DIAGNOSIS — E139 Other specified diabetes mellitus without complications: Secondary | ICD-10-CM

## 2013-09-09 DIAGNOSIS — D72829 Elevated white blood cell count, unspecified: Secondary | ICD-10-CM

## 2013-09-09 NOTE — Assessment & Plan Note (Signed)
Good control. On ACEI. No changes to rx.

## 2013-09-09 NOTE — Assessment & Plan Note (Signed)
Resolved. No vestibular rehab indicated at this time.

## 2013-09-09 NOTE — Progress Notes (Signed)
Pre visit review using our clinic review tool, if applicable. No additional management support is needed unless otherwise documented below in the visit note. 

## 2013-09-09 NOTE — Assessment & Plan Note (Signed)
Seeing neuro surgery. Awaiting tx options- she is anxious to proceed. BP improved today.

## 2013-09-09 NOTE — Progress Notes (Signed)
Subjective:   Patient ID: Briana Ochoa, female    DOB: [birth year], 73 y.o.   MRN: [id number]  Briana Ochoa is a pleasant 73 y.o. year old female who presents to clinic today with Hospitalization Follow-up and diabetic bundle  on 09/09/2013  HPI:  Admitted to [facility] 7/14- 08/21/2013- notes reviewed.  Presented to ER for recurrent episodes of vertigo with severe nausea and vomiting.    CT angiogram showed multiple small hemangiomas and right ICA aneurysm.  This was discussed with neurosurgery in hospital and recommended f/u with neuro surg in a few weeks.  Ct Angio Head W/cm &/or Wo Cm  08/20/2013   CLINICAL DATA:  Evaluate for aneurysm.  Follow-up abnormal head CT.  EXAM: CT ANGIOGRAPHY HEAD  TECHNIQUE: Multidetector CT imaging of the head was performed using the standard protocol during bolus administration of intravenous contrast. Multiplanar CT image reconstructions and MIPs were obtained to evaluate the vascular anatomy.  CONTRAST:  131mL OMNIPAQUE IOHEXOL 350 MG/ML SOLN  COMPARISON:  None.  FINDINGS: ANTERIOR CIRCULATION:  The visualized portions of the distal cervical segments of the internal carotid arteries are symmetric in caliber and well opacified. The petrous segments are normal bilaterally.  Mild calcified atheromatous disease present within the cavernous segment of the left internal carotid artery without hemodynamically significant stenosis. Supra clinoid left ICA is within normal limits.  On the right, a large wide necked peripherally calcified aneurysm measuring approximately 1.0 (craniocaudad) x 1.1 (AP) x 1.1 (transverse) cm is seen arising from the anterior genu of the cavernous segment of the right ICA (series 5, image 24). The neck of the aneurysm measures approximately 6 x 6 x 6 mm. The aneurysm is directed superiorly and medially. Distally, the supra clinoid segment of the right ICA is within normal limits.  A1 segments are well opacified bilaterally. Anterior  communicating artery is normal. Anterior cerebral arteries are well opacified without abnormality.  M1 segments are well opacified bilaterally without proximal branch occlusion or hemodynamically significant stenosis. No aneurysm seen at the MCA bifurcations. Distal MCA branches are symmetric bilaterally.  POSTERIOR CIRCULATION:  The left vertebral artery is dominant. The right vertebral artery appears to terminate in the right posterior inferior cerebellar artery. The posterior inferior cerebellar arteries themselves are symmetric in caliber and normal in appearance. The vertebrobasilar junction and basilar artery are within normal limits. No basilar tip aneurysm. Posterior cerebral arteries are normal. A small left posterior communicating artery is noted. Superior cerebellar arteries are within normal limits. Probable dominant left anterior inferior cerebellar artery.  No aneurysm seen within the posterior circulation.  Review of the MIP images confirms the above findings.  Osseous excrescence measuring approximately 1.1 x 0.6 cm seen at the posterior aspect of the left petrous apex again noted, which may reflect a a calcified meningioma. Addition and exophytic calcification arising from the lateral aspect of the left supra clinoid process measures 1.3 x 0.4 cm, also suspicious for meningioma.  Patchy supratentorial white matter disease again noted. No large vessel territory infarct or hemorrhage. No midline shift or hydrocephalus. No extra-axial fluid collection.  IMPRESSION: 1. 1.0 x 1.1 x 1.1 cm wide necked peripherally calcified aneurysm arising from the anterior genu of the cavernous segment of the right internal carotid artery (para ophthalmic). 2. Stable probable calcified meningiomas as above. 3. No acute intracranial process.   Electronically Signed   By: Jeannine Boga M.D.   On: 08/20/2013 22:10   Saw neurosurg Thursday- did an angiogram.  Seeing  him again next week to discuss treatment  options.  Also recently diagnosed with breast CA- reviewed last breast clinic note from 08/28/13- DCIS of left breast- recommended lumpectomy and possible radiation. Biopsy on second calcification done.  Has follow up to discuss tomorrow. She would like to have breast surgery before she has has her aneurysm repaired.  She is awaiting the specialist decision on this.  No recurrent dizziness.  No nausea or vomiting.    Lab Results  Component Value Date   HGBA1C 6.4* 08/21/2013   Lab Results  Component Value Date   CHOL 208* 04/16/2013   HDL 60.80 04/16/2013   LDLCALC 109* 04/16/2013   TRIG 190.0* 04/16/2013   CHOLHDL 3 04/16/2013   Current Outpatient Prescriptions on File Prior to Visit  Medication Sig Dispense Refill  . ACCU-CHEK SOFTCLIX LANCETS lancets 1 each daily as needed (to stick finger).       [name] aspirin 81 MG tablet Take 81 mg by mouth daily.      [name] glucose blood test strip Use as instructed  100 each  12  . lisinopril (PRINIVIL,ZESTRIL) 20 MG tablet Take 1 tablet (20 mg total) by mouth daily.  90 tablet  3  . meclizine (ANTIVERT) 12.5 MG tablet Take 1 tablet (12.5 mg total) by mouth 3 (three) times daily as needed for dizziness.  30 tablet  0  . ondansetron (ZOFRAN ODT) 4 MG disintegrating tablet Take 1 tablet (4 mg total) by mouth every 8 (eight) hours as needed for nausea or vomiting.  20 tablet  0  . Vitamin D, Ergocalciferol, (DRISDOL) 50000 UNITS CAPS capsule Take 50,000 Units by mouth every 7 (seven) days.       No current facility-administered medications on file prior to visit.    No Known Allergies  Past Medical History  Diagnosis Date  . Hypertension   . Diabetes mellitus without complication   . Heart murmur   . Arthritis   . Arthritis of knee   . Vertigo     Past Surgical History  Procedure Laterality Date  . Cesarean section    . Cholecystectomy    . Splenectomy, total    . Breast biopsy      Left    Family History  Problem Relation Age of Onset   . Cancer Mother     kidney  . Heart attack Paternal Grandfather   . Breast cancer Maternal Aunt   . Breast cancer Paternal Aunt     History   Social History  . Marital Status: Married    Spouse Name: N/A    Number of Children: N/A  . Years of Education: N/A   Occupational History  . Not on file.   Social History Main Topics  . Smoking status: Former Research scientist (life sciences)  . Smokeless tobacco: Never Used  . Alcohol Use: No  . Drug Use: No  . Sexual Activity: Not on file   Other Topics Concern  . Not on file   Social History Narrative   Widowed 2013   5 children   17 grandchildren      Does not have a living will-desires CPR, does not want prolonged life support if futile.   The PMH, PSH, Social History, Family History, Medications, and allergies have been reviewed in [facility], and have been updated if relevant.   Review of Systems See HPI   Denies anxiety or depression- feels "lucky" that vertigo lead to discovery of her aneursyms No CP or SOB No HA No  dizziness No LE edema No nausea or vomiting  Objective:    BP 140/78  Pulse 72  Temp(Src) 98 F (36.7 C) (Oral)  Wt 189 lb 12 oz (86.07 kg)  SpO2 96%  BP Readings from Last 3 Encounters:  09/09/13 140/78  09/05/13 151/91  08/28/13 142/71    Physical Exam  Constitutional: She is oriented to person, place, and time. She appears well-developed and well-nourished. No distress.  HENT:  Head: Normocephalic and atraumatic.  Neurological: She is alert and oriented to person, place, and time. No cranial nerve deficit. Coordination normal.  Skin: Skin is warm and dry.  Psychiatric: Her behavior is normal. Judgment and thought content normal.          Assessment & Plan:   Diabetes mellitus due to underlying condition without complications  Cancer of central portion of left female breast  Vertigo  Right internal carotid artery aneurysm  Meningioma, multiple  Leukocytosis, unspecified No Follow-up on  file.

## 2013-09-09 NOTE — Assessment & Plan Note (Signed)
Lumpectomy and radiation being scheduled possibly prior to tx of aneurysm.

## 2013-09-09 NOTE — Patient Instructions (Signed)
Great to see you. Keep me updated! 

## 2013-09-10 ENCOUNTER — Encounter (INDEPENDENT_AMBULATORY_CARE_PROVIDER_SITE_OTHER): Payer: Self-pay | Admitting: General Surgery

## 2013-09-10 ENCOUNTER — Ambulatory Visit (INDEPENDENT_AMBULATORY_CARE_PROVIDER_SITE_OTHER): Payer: Commercial Managed Care - HMO | Admitting: General Surgery

## 2013-09-10 VITALS — BP 132/86 | HR 89 | Temp 98.0°F | Resp 18 | Ht 64.5 in | Wt 189.0 lb

## 2013-09-10 DIAGNOSIS — C50112 Malignant neoplasm of central portion of left female breast: Secondary | ICD-10-CM

## 2013-09-10 DIAGNOSIS — C50119 Malignant neoplasm of central portion of unspecified female breast: Secondary | ICD-10-CM

## 2013-09-10 NOTE — Progress Notes (Addendum)
Patient ID: Briana Ochoa, female   DOB: 1940/09/09, 73 y.o.   MRN: [id number]  History: This patient returns with one of her friends for her second discussion regarding management of her left breast cancer. She had a second biopsy of the left breast, 5 cm away, posterior and superior from the original cancer. This also shows ductal carcinoma in situ.  She has discussed her carotid aneurysm with her neurosurgeon who states that it will need to be stinted and  that she will need anticoagulation. She states that he told her to go ahead with the breast cancer surgery first. I have put in a call to him to discuss this. This sounds appropriate.  Her initial presentation is summarized below:  She is referred by Dr. Conchita Paris at the breast center of [facility] for evaluation and management of ductal carcinoma of the left breast, central location. Dr. Arnette Norris is her PCP. She was evaluated in the [facility] today by Dr. Pablo Ledger and me.  The patient has no prior history of breast problems. She went for screening mammograms and there was a 16 x 7 x 11 mm area of suspicious calcifications in the left breast centrally, deep to the nipple and areola. This area was biopsied and showed what appears to be intermediate grade DCIS, ER +100%, PR-positive 100%.  A second area of calcifications was noted superior and deep to the index lesion, 5 cm away.They spanned 6 mm. A biopsy of this area scheduled for July 29.  MRI showed both of these areas, and the primary cancer is felt to span 2 cm by MRI. 2 benign fibroadenomas were seen on the right side.  Family history reveals breast cancer in maternal aunt and paternal aunt. No ovarian cancer. She is a widow but has 4 children and is healthy.  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  She recently developed vertigo and has been told she has a carotid aneurysm (cavernous segment) and is going to see one of our  neurovascular surgeons tomorrow. CT Angio head also shows stable, probable calcified meningiomas.  Past history, family history, social history, and review of systems are documented on the chart, unchanged, and noncontributory except as described above  Exam: I did not examine the patient today, but spent the better part of an hour discussing her options and decision making regarding her breast cancer.   Assessment  Ductal carcinoma in situ left breast, 2 cm span, receptor positive, central location And second, multifocal focus of DCIS 5 cm away, superior and posterior  Fibroadenomas right breast  Diagnosed carotid aneurysm, cavernous segment, neurovascular evaluation  Hypertension  Diet-controlled diabetes  Remote history splenectomy  Remote history of open cholecystectomy  Remote history C-section  Family history breast cancer in maternal aunt and paternal aunt.   Plan: Had another long discussion with the patient. She's aware that her options are double wire bracketed localization and lumpectomy, which I think is doable given that her breast size is 42D. Her other option is mastectomy with or without reconstruction. If she chooses lumpectomy I don't think that we really have to do a sentinel node biopsy since the DCIS is only intermediate grade and central in location. I discussed the indications, details, techniques and numerous risk of all of these surgeries. She's aware of the risk of bleeding, infection, reoperation for positive margins, cosmetic deformity, and other problems. She understands all these issues well. Stomal for questions rancher.  It is her preference, and she  will be scheduled for left partial mastectomy with double wire localization in the near future.  I put in a call to the neurosurgery to confirm with her neurosurgeon that this is the appropriate next at in her comprehensive care. Addendum: 09/11/2013. Her neurosurgeon (Dr. Kathyrn Sheriff)   did call me back stating  that there was no urgency for stenting her intracranial aneurysm. He felt it was low risk in the short-term. He advised proceeding with her breast cancer surgery.    Edsel Petrin. Dalbert Batman, M.D., [facility] General and Minimally invasive Surgery Breast and Colorectal Surgery Office:   [phone] Pager:   [phone]

## 2013-09-10 NOTE — Patient Instructions (Signed)
Your second left breast biopsy allso shows shows ductal carcinoma in situ. This area is 5 cm away from the original cancer.  We have discussed options for surgical management including double wire bracketed lumpectomy and mastectomy. We talked about radiation therapy and antiestrogen therapy in general, which are decisions that will be made later.  We talked about the need for your carotid artery aneurysm to be stented and for you to be placed on anti-coagulation.  I will speak to your neurosurgeon. I put in a call to him today already.  You will be scheduled for left partial mastectomy with double wire needle localization in the near future.     Lumpectomy A lumpectomy is a form of "breast conserving" or "breast preservation" surgery. It may also be referred to as a partial mastectomy. During a lumpectomy, the portion of the breast that contains the cancerous tumor or breast mass (the lump) is removed. Some normal tissue around the lump may also be removed to make sure all of the tumor has been removed.  LET [facility] CARE PROVIDER KNOW ABOUT:  Any allergies you have.  All medicines you are taking, including vitamins, herbs, eye drops, creams, and over-the-counter medicines.  Previous problems you or members of your family have had with the use of anesthetics.  Any blood disorders you have.  Previous surgeries you have had.  Medical conditions you have. RISKS AND COMPLICATIONS Generally, this is a safe procedure. However, problems can occur and include:  Bleeding.  Infection.  Pain.  Temporary swelling.  Change in the shape of the breast, particularly if a large portion is removed. BEFORE THE PROCEDURE  Ask your health care provider about changing or stopping your regular medicines. This is especially important if you are taking diabetes medicines or blood thinners.  Do not eat or drink anything after midnight on the night before the procedure or as directed by your  health care provider. Ask your health care provider if you can take a sip of water with any approved medicines.  On the day of surgery, your health care provider will use a mammogram or ultrasound to locate and mark the tumor in your breast. These markings on your breast will show where the cut (incision) will be made. PROCEDURE   An IV tube will be put into one of your veins.  You may be given medicine to help you relax before the surgery (sedative). You will be given one of the following:  A medicine that numbs the area (local anesthetic).  A medicine that makes you fall asleep (general anesthetic).  Your health care provider will use a kind of electric scalpel that uses heat to minimize bleeding (electrocautery knife).  A curved incision (like a smile or frown) that follows the natural curve of your breast is made, to allow for minimal scarring and better healing.  The tumor will be removed with some of the surrounding tissue. This will be sent to the lab for analysis. Your health care provider may also remove your lymph nodes at this time if needed.  Sometimes, but not always, a rubber tube called a drain will be surgically inserted into your breast area or armpit to collect excess fluid that may accumulate in the space where the tumor was. This drain is connected to a plastic bulb on the outside of your body. This drain creates suction to help remove the fluid.  The incisions will be closed with stitches (sutures).  A bandage may be placed over the  incisions. AFTER THE PROCEDURE  You will be taken to the recovery area.  You will be given medicine for pain.  A small rubber drain may be placed in the breast for 2-3 days to prevent a collection of blood (hematoma) from developing in the breast. You will be given instructions on caring for the drain before you go home.  A pressure bandage (dressing) will be applied for 1-2 days to prevent bleeding. Ask your health care provider how  to care for your bandage at home. Document Released: 03/07/2006 Document Revised: 06/10/2013 Document Reviewed: 06/29/2012 [facility] Patient Information 2015 [place], [place]. This information is not intended to replace advice given to you by your health care provider. Make sure you discuss any questions you have with your health care provider.

## 2013-09-16 ENCOUNTER — Other Ambulatory Visit (HOSPITAL_COMMUNITY): Payer: Self-pay | Admitting: *Deleted

## 2013-09-16 ENCOUNTER — Encounter (HOSPITAL_COMMUNITY): Payer: Self-pay | Admitting: Pharmacy Technician

## 2013-09-16 DIAGNOSIS — I671 Cerebral aneurysm, nonruptured: Secondary | ICD-10-CM | POA: Diagnosis present

## 2013-09-16 NOTE — H&P (Signed)
Briana Ochoa   MRN:  [id number]   Description: 73 year old female  Provider: [name], MD  Department: [facility]         Diagnoses      Cancer of central portion of left female breast    -  Primary      174.1            Current Vitals Most recent update: 09/10/2013  1:23 PM by Zenovia Jordan, LPN      BP Pulse Temp(Src) Resp Ht Wt      132/86 89 98 F (36.7 C) 18 5' 4.5" (1.638 m) 189 lb (85.73 kg)      BMI 31.95 kg/m2                   History and physical   [name], MD    Status: Addendum            Patient ID: Briana Ochoa, female   DOB: [birth year], 73 y.o.   MRN: [id number]        History: This patient returns with one of her friends for her second discussion regarding management of her left breast cancer. She had a second biopsy of the left breast, 5 cm away, posterior and superior from the original cancer. This also shows ductal carcinoma in situ.   She has discussed her carotid aneurysm with her neurosurgeon who states that it will need to be stinted and  that she will need anticoagulation. She states that he told her to go ahead with the breast cancer surgery first. I have put in a call to him to discuss this. This sounds appropriate.   Her initial presentation is summarized below:  She is referred by Dr. Conchita Paris at the breast center of [facility] for evaluation and management of ductal carcinoma of the left breast, central location. Dr. Arnette Norris is her PCP. She was evaluated in the [facility] today by Dr. Pablo Ledger and me.   The patient has no prior history of breast problems. She went for screening mammograms and there was a 16 x 7 x 11 mm area of suspicious calcifications in the left breast centrally, deep to the nipple and areola. This area was biopsied and showed what appears to be intermediate grade DCIS, ER +100%, PR-positive 100%.   A second area of calcifications was noted superior and deep to the index lesion, 5 cm  away.They spanned 6 mm. A biopsy of this area scheduled for July 29.   MRI showed both of these areas, and the primary cancer is felt to span 2 cm by MRI. 2 benign fibroadenomas were seen on the right side.   Family history reveals breast cancer in maternal aunt and paternal aunt. No ovarian cancer. She is a widow but has 4 children and is healthy.   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   She recently developed vertigo and has been told she has a carotid aneurysm (cavernous segment) and is going to see one of our neurovascular surgeons tomorrow. CT Angio head also shows stable, probable calcified meningiomas.   Past history, family history, social history, and review of systems are documented on the chart, unchanged, and noncontributory except as described above   Exam: I did not examine the patient today, but spent the better part of an hour discussing her options and decision making regarding her breast cancer.   refer to my complete H&  P dated 08/28/2013.     Assessment   Ductal carcinoma in situ left breast, 2 cm span, receptor positive, central location And second, multifocal focus of DCIS 5 cm away, superior and posterior  Fibroadenomas right breast   Diagnosed carotid aneurysm, cavernous segment, neurovascular evaluation   Hypertension   Diet-controlled diabetes   Remote history splenectomy   Remote history of open cholecystectomy   Remote history C-section   Family history breast cancer in maternal aunt and paternal aunt.    Plan: Had another long discussion with the patient. She's aware that her options are double wire bracketed localization and lumpectomy, which I think is doable given that her breast size is 42D. Her other option is mastectomy with or without reconstruction. If she chooses lumpectomy I don't think that we really have to do a sentinel node biopsy since the DCIS is only intermediate grade and  central in location. I discussed the indications, details, techniques and numerous risk of all of these surgeries. She's aware of the risk of bleeding, infection, reoperation for positive margins, cosmetic deformity, and other problems. She understands all these issues well. Stomal for questions rancher.   It is her preference, and she will be scheduled for left partial mastectomy with double wire localization in the near future.   I put in a call to the neurosurgery to confirm with her neurosurgeon that this is the appropriate next at in her comprehensive care. Addendum: 09/11/2013. Her neurosurgeon did call me back stating that there was no urgency for stenting her intracranial aneurysm. He felt it was low risk in the short-term. He advised proceeding with her breast cancer surgery.       Edsel Petrin. Dalbert Batman, M.D., [facility] General and Minimally invasive Surgery Breast and Colorectal Surgery Office:   [phone] Pager:   [phone]

## 2013-09-16 NOTE — Pre-Procedure Instructions (Addendum)
Briana Ochoa  09/16/2013   Your procedure is scheduled on:  Thursday, September 19, 2013 at 10:15 AM.   Report to [facility] Entrance "A" Admitting Office at 8:15 AM.   Call this number if you have problems the morning of surgery: [phone]   Remember:   Do not eat food or drink liquids after midnight Wednesday, 09/18/13.   Take these medicines the morning of surgery with A SIP OF WATER: None (stop aspirin, coumadin, plavix, effient, herbal medicines)   Do not wear jewelry, make-up or nail polish.  Do not wear lotions, powders, or perfumes. You may NOT wear deodorant.  Do not shave 48 hours prior to surgery.   Do not bring valuables to the hospital.  [facility] is not responsible                  for any belongings or valuables.               Contacts, dentures or bridgework may not be worn into surgery.  Leave suitcase in the car. After surgery it may be brought to your room.  For patients admitted to the hospital, discharge time is determined by your                treatment team.               Patients discharged the day of surgery will not be allowed to drive home.  Name and phone number of your driver: Family/friend   Special Instructions: [place] - Preparing for Surgery  Before surgery, you can play an important role.  Because skin is not sterile, your skin needs to be as free of germs as possible.  You can reduce the number of germs on you skin by washing with CHG (chlorahexidine gluconate) soap before surgery.  CHG is an antiseptic cleaner which kills germs and bonds with the skin to continue killing germs even after washing.  Please DO NOT use if you have an allergy to CHG or antibacterial soaps.  If your skin becomes reddened/irritated stop using the CHG and inform your nurse when you arrive at Short Stay.  Do not shave (including legs and underarms) for at least 48 hours prior to the first CHG shower.  You may shave your face.  Please follow these  instructions carefully:   1.  Shower with CHG Soap the night before surgery and the                                morning of Surgery.  2.  If you choose to wash your hair, wash your hair first as usual with your       normal shampoo.  3.  After you shampoo, rinse your hair and body thoroughly to remove the                      Shampoo.  4.  Use CHG as you would any other liquid soap.  You can apply chg directly       to the skin and wash gently with scrungie or a clean washcloth.  5.  Apply the CHG Soap to your body ONLY FROM THE NECK DOWN.        Do not use on open wounds or open sores.  Avoid contact with your eyes, ears, mouth and genitals (private parts).  Wash genitals (private parts) with  your normal soap.  6.  Wash thoroughly, paying special attention to the area where your surgery        will be performed.  7.  Thoroughly rinse your body with warm water from the neck down.  8.  DO NOT shower/wash with your normal soap after using and rinsing off       the CHG Soap.  9.  Pat yourself dry with a clean towel.            10.  Wear clean pajamas.            11.  Place clean sheets on your bed the night of your first shower and do not        sleep with pets.  Day of Surgery  Do not apply any lotions/deodorants the morning of surgery.  Please wear clean clothes to the hospital/surgery center.     Please read over the following fact sheets that you were given: Pain Booklet, Coughing and Deep Breathing and Surgical Site Infection Prevention

## 2013-09-17 ENCOUNTER — Encounter (HOSPITAL_COMMUNITY)
Admission: RE | Admit: 2013-09-17 | Discharge: 2013-09-17 | Disposition: A | Discharge status: Home / Self Care | Payer: Medicare HMO | Source: Ambulatory Visit | Attending: General Surgery | Admitting: General Surgery

## 2013-09-17 ENCOUNTER — Encounter (HOSPITAL_COMMUNITY): Payer: Self-pay

## 2013-09-17 DIAGNOSIS — I10 Essential (primary) hypertension: Secondary | ICD-10-CM | POA: Diagnosis not present

## 2013-09-17 DIAGNOSIS — Z87891 Personal history of nicotine dependence: Secondary | ICD-10-CM | POA: Diagnosis not present

## 2013-09-17 DIAGNOSIS — Z8679 Personal history of other diseases of the circulatory system: Secondary | ICD-10-CM | POA: Diagnosis not present

## 2013-09-17 DIAGNOSIS — D0590 Unspecified type of carcinoma in situ of unspecified breast: Secondary | ICD-10-CM | POA: Diagnosis present

## 2013-09-17 DIAGNOSIS — Z170 Estrogen receptor positive status [ER+]: Secondary | ICD-10-CM | POA: Diagnosis not present

## 2013-09-17 DIAGNOSIS — E119 Type 2 diabetes mellitus without complications: Secondary | ICD-10-CM | POA: Diagnosis not present

## 2013-09-17 HISTORY — DX: Cerebral aneurysm, nonruptured: I67.1

## 2013-09-17 LAB — COMPREHENSIVE METABOLIC PANEL
ALT: 12 U/L (ref 0–35)
AST: 18 U/L (ref 0–37)
Albumin: 3.7 g/dL (ref 3.5–5.2)
Alkaline Phosphatase: 92 U/L (ref 39–117)
Anion gap: 11 (ref 5–15)
BUN: 25 mg/dL — ABNORMAL HIGH (ref 6–23)
CO2: 24 mEq/L (ref 19–32)
Calcium: 9.5 mg/dL (ref 8.4–10.5)
Chloride: 106 mEq/L (ref 96–112)
Creatinine, Ser: 1.31 mg/dL — ABNORMAL HIGH (ref 0.50–1.10)
GFR calc Af Amer: 46 mL/min — ABNORMAL LOW (ref >90)
GFR calc non Af Amer: 39 mL/min — ABNORMAL LOW (ref >90)
Glucose, Bld: 112 mg/dL — ABNORMAL HIGH (ref 70–99)
Potassium: 5.6 mEq/L — ABNORMAL HIGH (ref 3.7–5.3)
Sodium: 141 mEq/L (ref 137–147)
Total Bilirubin: 0.3 mg/dL (ref 0.3–1.2)
Total Protein: 7.2 g/dL (ref 6.0–8.3)

## 2013-09-17 LAB — CBC WITH DIFFERENTIAL/PLATELET
Basophils Absolute: 0.1 10*3/uL (ref 0.0–0.1)
Basophils Relative: 1 % (ref 0–1)
Eosinophils Absolute: 0.4 10*3/uL (ref 0.0–0.7)
Eosinophils Relative: 4 % (ref 0–5)
HCT: 49.3 % — ABNORMAL HIGH (ref 36.0–46.0)
Hemoglobin: 16.6 g/dL — ABNORMAL HIGH (ref 12.0–15.0)
Lymphocytes Relative: 24 % (ref 12–46)
Lymphs Abs: 2.3 10*3/uL (ref 0.7–4.0)
MCH: 31.2 pg (ref 26.0–34.0)
MCHC: 33.7 g/dL (ref 30.0–36.0)
MCV: 92.7 fL (ref 78.0–100.0)
Monocytes Absolute: 0.9 10*3/uL (ref 0.1–1.0)
Monocytes Relative: 9 % (ref 3–12)
Neutro Abs: 6 10*3/uL (ref 1.7–7.7)
Neutrophils Relative %: 62 % (ref 43–77)
Platelets: 287 10*3/uL (ref 150–400)
RBC: 5.32 MIL/uL — ABNORMAL HIGH (ref 3.87–5.11)
RDW: 13.8 % (ref 11.5–15.5)
WBC: 9.6 10*3/uL (ref 4.0–10.5)

## 2013-09-17 NOTE — Progress Notes (Signed)
Anesthesia Chart Review:  Patient is a 73 year old female scheduled for left partial mastectomy on 09/19/13 by Dr. Dalbert Batman.  History includes left breast cancer, former smoker, HTN, DM2, right ICA aneurysm, murmur (not specified), arthritis, CKD, vertigo, cholecystectomy, splenectomy. BMI is consistent with obesity. PCP is Dr. Merri Brunette who is aware of plans for surgery. Notes from Dr. Dalbert Batman indicate that he spoke with patient's neurosurgeon on 09/11/13 who felt "...there was no urgency for stenting her intracranial aneurysm. He felt it was low risk in the short-term. He advised proceeding with her breast cancer surgery."  EKG on 08/20/13 showed: SR, LAE, non-specific T wave abnormality (most pronounced in inferior and anterior leads).  Overall, EKG appears stable when compared to prior tracing in [facility] from 02/12/05.  Diagnostic cerebral angiogram on 09/05/13 (by neurosurgeon Dr. Kathyrn Sheriff) showed: 1. 27mm x 48mm x 60mm paraophthalmic RICA aneursym projects superiorly  2. Fetal-type supply of the superior cerebellum from the petrous RICA is incidentally noted  3. Infundibular origin of the left Pcom and AChor aa.  4. 5Fr ExoSeal closure  CXR on 08/20/13 showed: No active cardiopulmonary disease.  Preoperative labs noted.  H/H stable at 16.6/49.3.  BUN/Cr stable at 25/1.31.  Glucose 112. (A1C on 08/21/13 was 6.4.) K+ is elevated at 5.6--not documented as hemolyzed.  Plan for ISTAT on arrival to re-evaluate for hyperkalemia.  If K+ is < 6.0 then I would anticipate that she could proceed as planned.  George Hugh [facility] Short Stay Center/Anesthesiology Phone [phone] 09/17/2013 5:27 PM

## 2013-09-18 MED ORDER — CEFAZOLIN SODIUM-DEXTROSE 2-3 GM-% IV SOLR
2.0000 g | INTRAVENOUS | Status: AC
  Administered 2013-09-19: 2 g via INTRAVENOUS

## 2013-09-18 MED ORDER — CHLORHEXIDINE GLUCONATE 4 % EX LIQD
1.0000 "application " | Freq: Once | CUTANEOUS | Status: DC
  Filled 2013-09-18: qty 15

## 2013-09-19 ENCOUNTER — Encounter (HOSPITAL_COMMUNITY): Payer: Self-pay | Admitting: Certified Registered"

## 2013-09-19 ENCOUNTER — Ambulatory Visit (HOSPITAL_COMMUNITY)
Admission: RE | Admit: 2013-09-19 | Discharge: 2013-09-19 | Disposition: A | Discharge status: Home / Self Care | Payer: Medicare HMO | Source: Ambulatory Visit | Attending: General Surgery | Admitting: General Surgery

## 2013-09-19 ENCOUNTER — Ambulatory Visit (HOSPITAL_COMMUNITY): Payer: Medicare HMO | Admitting: Certified Registered"

## 2013-09-19 ENCOUNTER — Ambulatory Visit
Admission: RE | Admit: 2013-09-19 | Discharge: 2013-09-19 | Disposition: A | Discharge status: Home / Self Care | Payer: Medicare PPO | Source: Ambulatory Visit | Attending: General Surgery | Admitting: General Surgery

## 2013-09-19 ENCOUNTER — Other Ambulatory Visit (INDEPENDENT_AMBULATORY_CARE_PROVIDER_SITE_OTHER): Payer: Self-pay | Admitting: General Surgery

## 2013-09-19 ENCOUNTER — Ambulatory Visit
Admission: RE | Admit: 2013-09-19 | Discharge: 2013-09-19 | Disposition: A | Discharge status: Home / Self Care | Payer: Commercial Managed Care - HMO | Source: Ambulatory Visit | Attending: General Surgery | Admitting: General Surgery

## 2013-09-19 ENCOUNTER — Encounter (HOSPITAL_COMMUNITY)
Admission: RE | Disposition: A | Discharge status: Home / Self Care | Payer: Self-pay | Source: Ambulatory Visit | Attending: General Surgery

## 2013-09-19 ENCOUNTER — Encounter (HOSPITAL_COMMUNITY): Payer: Medicare HMO | Admitting: Vascular Surgery

## 2013-09-19 DIAGNOSIS — C50112 Malignant neoplasm of central portion of left female breast: Secondary | ICD-10-CM

## 2013-09-19 DIAGNOSIS — E119 Type 2 diabetes mellitus without complications: Secondary | ICD-10-CM | POA: Diagnosis not present

## 2013-09-19 DIAGNOSIS — I10 Essential (primary) hypertension: Secondary | ICD-10-CM | POA: Diagnosis not present

## 2013-09-19 DIAGNOSIS — Z8679 Personal history of other diseases of the circulatory system: Secondary | ICD-10-CM | POA: Insufficient documentation

## 2013-09-19 DIAGNOSIS — D0590 Unspecified type of carcinoma in situ of unspecified breast: Secondary | ICD-10-CM

## 2013-09-19 DIAGNOSIS — C50119 Malignant neoplasm of central portion of unspecified female breast: Secondary | ICD-10-CM

## 2013-09-19 DIAGNOSIS — Z87891 Personal history of nicotine dependence: Secondary | ICD-10-CM | POA: Insufficient documentation

## 2013-09-19 DIAGNOSIS — Z170 Estrogen receptor positive status [ER+]: Secondary | ICD-10-CM | POA: Insufficient documentation

## 2013-09-19 DIAGNOSIS — D0512 Intraductal carcinoma in situ of left breast: Secondary | ICD-10-CM | POA: Diagnosis present

## 2013-09-19 HISTORY — PX: BREAST LUMPECTOMY WITH NEEDLE LOCALIZATION: SHX5759

## 2013-09-19 LAB — POCT I-STAT 4, (NA,K, GLUC, HGB,HCT)
Glucose, Bld: 131 mg/dL — ABNORMAL HIGH (ref 70–99)
HCT: 48 % — ABNORMAL HIGH (ref 36.0–46.0)
Hemoglobin: 16.3 g/dL — ABNORMAL HIGH (ref 12.0–15.0)
Potassium: 5 mEq/L (ref 3.7–5.3)
Sodium: 140 mEq/L (ref 137–147)

## 2013-09-19 LAB — GLUCOSE, CAPILLARY
Glucose-Capillary: 119 mg/dL — ABNORMAL HIGH (ref 70–99)
Glucose-Capillary: 107 mg/dL — ABNORMAL HIGH (ref 70–99)

## 2013-09-19 LAB — APTT: aPTT: 41 seconds — ABNORMAL HIGH (ref 24–37)

## 2013-09-19 LAB — PROTIME-INR
INR: 1.03 (ref 0.00–1.49)
Prothrombin Time: 13.5 seconds (ref 11.6–15.2)

## 2013-09-19 SURGERY — BREAST LUMPECTOMY WITH NEEDLE LOCALIZATION
Anesthesia: General | Site: Breast | Laterality: Left

## 2013-09-19 MED ORDER — PROPOFOL 10 MG/ML IV BOLUS
INTRAVENOUS | Status: AC
  Filled 2013-09-19: qty 20

## 2013-09-19 MED ORDER — SODIUM CHLORIDE 0.9 % IV SOLN
INTRAVENOUS | Status: DC | PRN
  Administered 2013-09-19: 11:00:00 via INTRAVENOUS

## 2013-09-19 MED ORDER — HYDROCODONE-ACETAMINOPHEN 5-325 MG PO TABS: 1.0000 | ORAL_TABLET | Freq: Four times a day (QID) | ORAL | Status: DC | PRN

## 2013-09-19 MED ORDER — MIDAZOLAM HCL 5 MG/5ML IJ SOLN
INTRAMUSCULAR | Status: DC | PRN
  Administered 2013-09-19: 2 mg via INTRAVENOUS

## 2013-09-19 MED ORDER — LIDOCAINE HCL (CARDIAC) 20 MG/ML IV SOLN
INTRAVENOUS | Status: AC
  Filled 2013-09-19: qty 5

## 2013-09-19 MED ORDER — PROPOFOL 10 MG/ML IV BOLUS
INTRAVENOUS | Status: DC | PRN
  Administered 2013-09-19: 170 mg via INTRAVENOUS

## 2013-09-19 MED ORDER — ONDANSETRON HCL 4 MG/2ML IJ SOLN
INTRAMUSCULAR | Status: DC | PRN
  Administered 2013-09-19: 4 mg via INTRAVENOUS

## 2013-09-19 MED ORDER — ONDANSETRON HCL 4 MG/2ML IJ SOLN
INTRAMUSCULAR | Status: AC
  Filled 2013-09-19: qty 2

## 2013-09-19 MED ORDER — LIDOCAINE-EPINEPHRINE (PF) 1 %-1:200000 IJ SOLN
INTRAMUSCULAR | Status: AC
  Filled 2013-09-19: qty 10

## 2013-09-19 MED ORDER — DEXAMETHASONE SODIUM PHOSPHATE 4 MG/ML IJ SOLN
INTRAMUSCULAR | Status: DC | PRN
  Administered 2013-09-19: 4 mg via INTRAVENOUS

## 2013-09-19 MED ORDER — LIDOCAINE HCL (CARDIAC) 20 MG/ML IV SOLN
INTRAVENOUS | Status: DC | PRN
  Administered 2013-09-19: 60 mg via INTRAVENOUS

## 2013-09-19 MED ORDER — 0.9 % SODIUM CHLORIDE (POUR BTL) OPTIME
TOPICAL | Status: DC | PRN
  Administered 2013-09-19: 1000 mL

## 2013-09-19 MED ORDER — PHENYLEPHRINE 40 MCG/ML (10ML) SYRINGE FOR IV PUSH (FOR BLOOD PRESSURE SUPPORT)
PREFILLED_SYRINGE | INTRAVENOUS | Status: AC
  Filled 2013-09-19: qty 10

## 2013-09-19 MED ORDER — DEXAMETHASONE SODIUM PHOSPHATE 4 MG/ML IJ SOLN
INTRAMUSCULAR | Status: AC
  Filled 2013-09-19: qty 1

## 2013-09-19 MED ORDER — FENTANYL CITRATE 0.05 MG/ML IJ SOLN
INTRAMUSCULAR | Status: AC
  Filled 2013-09-19: qty 5

## 2013-09-19 MED ORDER — FENTANYL CITRATE 0.05 MG/ML IJ SOLN: 25.0000 ug | INTRAMUSCULAR | Status: DC | PRN

## 2013-09-19 MED ORDER — FENTANYL CITRATE 0.05 MG/ML IJ SOLN
INTRAMUSCULAR | Status: DC | PRN
  Administered 2013-09-19: 75 ug via INTRAVENOUS
  Administered 2013-09-19: 25 ug via INTRAVENOUS
  Administered 2013-09-19: 50 ug via INTRAVENOUS

## 2013-09-19 MED ORDER — MIDAZOLAM HCL 2 MG/2ML IJ SOLN
INTRAMUSCULAR | Status: AC
  Filled 2013-09-19: qty 2

## 2013-09-19 MED ORDER — CEFAZOLIN SODIUM-DEXTROSE 2-3 GM-% IV SOLR
INTRAVENOUS | Status: AC
  Filled 2013-09-19: qty 50

## 2013-09-19 MED ORDER — PHENYLEPHRINE HCL 10 MG/ML IJ SOLN
INTRAMUSCULAR | Status: DC | PRN
Start: 2013-09-19 — End: 2013-09-19
  Administered 2013-09-19: 80 ug via INTRAVENOUS
  Administered 2013-09-19: 120 ug via INTRAVENOUS
  Administered 2013-09-19: 80 ug via INTRAVENOUS
  Administered 2013-09-19: 120 ug via INTRAVENOUS

## 2013-09-19 MED ORDER — LACTATED RINGERS IV SOLN
INTRAVENOUS | Status: DC | PRN
  Administered 2013-09-19: 10:00:00 via INTRAVENOUS

## 2013-09-19 MED ORDER — ROCURONIUM BROMIDE 50 MG/5ML IV SOLN
INTRAVENOUS | Status: AC
  Filled 2013-09-19: qty 1

## 2013-09-19 MED ORDER — LIDOCAINE-EPINEPHRINE (PF) 1 %-1:200000 IJ SOLN
INTRAMUSCULAR | Status: DC | PRN
  Administered 2013-09-19: 7 mL

## 2013-09-19 SURGICAL SUPPLY — 53 items
ADH SKN CLS APL DERMABOND .7 (GAUZE/BANDAGES/DRESSINGS) ×1
APPLIER CLIP 9.375 MED OPEN (MISCELLANEOUS)
APR CLP MED 9.3 20 MLT OPN (MISCELLANEOUS)
BINDER BREAST LRG (GAUZE/BANDAGES/DRESSINGS) IMPLANT
BINDER BREAST XLRG (GAUZE/BANDAGES/DRESSINGS) ×1 IMPLANT
CANISTER SUCTION 2500CC (MISCELLANEOUS) ×2 IMPLANT
CHLORAPREP W/TINT 26ML (MISCELLANEOUS) ×2 IMPLANT
CLIP APPLIE 9.375 MED OPEN (MISCELLANEOUS) IMPLANT
CONT SPEC 4OZ CLIKSEAL STRL BL (MISCELLANEOUS) ×1 IMPLANT
COVER SURGICAL LIGHT HANDLE (MISCELLANEOUS) ×2 IMPLANT
DECANTER SPIKE VIAL GLASS SM (MISCELLANEOUS) ×1 IMPLANT
DERMABOND ADVANCED (GAUZE/BANDAGES/DRESSINGS) ×1
DERMABOND ADVANCED .7 DNX12 (GAUZE/BANDAGES/DRESSINGS) ×1 IMPLANT
DEVICE DUBIN SPECIMEN MAMMOGRA (MISCELLANEOUS) ×2 IMPLANT
DRAPE LAPAROSCOPIC ABDOMINAL (DRAPES) ×2 IMPLANT
DRAPE PROXIMA HALF (DRAPES) ×1 IMPLANT
DRAPE UTILITY 15X26 W/TAPE STR (DRAPE) ×4 IMPLANT
ELECT CAUTERY BLADE 6.4 (BLADE) ×2 IMPLANT
ELECT REM PT RETURN 9FT ADLT (ELECTROSURGICAL) ×2
ELECTRODE REM PT RTRN 9FT ADLT (ELECTROSURGICAL) ×1 IMPLANT
GLOVE BIO SURGEON STRL SZ 6.5 (GLOVE) ×1 IMPLANT
GLOVE BIO SURGEON STRL SZ7.5 (GLOVE) ×1 IMPLANT
GLOVE BIOGEL PI IND STRL 6.5 (GLOVE) IMPLANT
GLOVE BIOGEL PI IND STRL 7.0 (GLOVE) IMPLANT
GLOVE BIOGEL PI INDICATOR 6.5 (GLOVE) ×1
GLOVE BIOGEL PI INDICATOR 7.0 (GLOVE) ×3
GLOVE EUDERMIC 7 POWDERFREE (GLOVE) ×2 IMPLANT
GOWN STRL REUS W/ TWL LRG LVL3 (GOWN DISPOSABLE) ×1 IMPLANT
GOWN STRL REUS W/ TWL XL LVL3 (GOWN DISPOSABLE) ×1 IMPLANT
GOWN STRL REUS W/TWL LRG LVL3 (GOWN DISPOSABLE) ×6
GOWN STRL REUS W/TWL XL LVL3 (GOWN DISPOSABLE) ×2
KIT BASIN OR (CUSTOM PROCEDURE TRAY) ×2 IMPLANT
KIT MARKER MARGIN INK (KITS) ×1 IMPLANT
KIT ROOM TURNOVER OR (KITS) ×2 IMPLANT
NDL HYPO 25GX1X1/2 BEV (NEEDLE) ×1 IMPLANT
NEEDLE HYPO 25GX1X1/2 BEV (NEEDLE) ×2 IMPLANT
NS IRRIG 1000ML POUR BTL (IV SOLUTION) ×2 IMPLANT
PACK GENERAL/GYN (CUSTOM PROCEDURE TRAY) ×2 IMPLANT
PAD ARMBOARD 7.5X6 YLW CONV (MISCELLANEOUS) ×4 IMPLANT
SPONGE GAUZE 4X4 12PLY STER LF (GAUZE/BANDAGES/DRESSINGS) ×1 IMPLANT
SPONGE LAP 4X18 X RAY DECT (DISPOSABLE) ×1 IMPLANT
STAPLER VISISTAT 35W (STAPLE) ×1 IMPLANT
SUT MNCRL AB 4-0 PS2 18 (SUTURE) ×2 IMPLANT
SUT SILK 2 0 SH (SUTURE) ×1 IMPLANT
SUT VIC AB 2-0 CT1 27 (SUTURE) ×4
SUT VIC AB 2-0 CT1 TAPERPNT 27 (SUTURE) IMPLANT
SUT VIC AB 3-0 SH 18 (SUTURE) ×2 IMPLANT
SUT VICRYL AB 3 0 TIES (SUTURE) ×1 IMPLANT
SYR CONTROL 10ML LL (SYRINGE) ×2 IMPLANT
TOWEL OR 17X24 6PK STRL BLUE (TOWEL DISPOSABLE) ×1 IMPLANT
TOWEL OR 17X26 10 PK STRL BLUE (TOWEL DISPOSABLE) ×2 IMPLANT
TOWEL OR NON WOVEN STRL DISP B (DISPOSABLE) ×1 IMPLANT
WATER STERILE IRR 1000ML POUR (IV SOLUTION) IMPLANT

## 2013-09-19 NOTE — Op Note (Addendum)
Patient Name:           Briana Ochoa   Date of Surgery:        09/19/2013  Pre op Diagnosis:      Ductal carcinoma in situ, left breast, receptor positive. Multifocal, central and superior  Post op Diagnosis:    Same  Procedure:                 Left partial mastectomy with double wire bracketed needle localization  Surgeon:                     Edsel Petrin. Dalbert Batman, M.D., FACS  Assistant:                      None  Operative Indications:     She is referred by Dr. Conchita Paris at the breast center of [facility] for evaluation and management of ductal carcinoma of the left breast, central location. Dr. Arnette Norris is her PCP. She was evaluated in the [facility] today by Dr. Pablo Ledger and me.  The patient has no prior history of breast problems. She went for screening mammograms and there was a 16 x 7 x 11 mm area of suspicious calcifications in the left breast centrally, deep to the nipple and areola. This area was biopsied and showed what appears to be intermediate grade DCIS, ER +100%, PR-positive 100%.  A second area of calcifications was noted superior and deep to the index lesion, 5 cm away.They spanned 6 mm.This area was subsequently biopsied and also shows ductal carcinoma in situ. MRI showed both of these areas, and the primary cancer is felt to span 2 cm by MRI. 2 benign fibroadenomas were seen on the right side.  The patient is strongly motivated for breast conservation. She was told that this is technically feasible but may result in some volume loss and cosmetic defect. She was accepting of that. Family history reveals breast cancer in maternal aunt and paternal aunt. No ovarian cancer. She is a widow but has 4 children and is healthy.  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     Operative Findings:       Both wires were placed from lateral to medial and lateral to supero-medial  locations. The lumpectomy specimen was  somewhat large due to the 5 cm or greater span between the 2 cancers and the need for negative margins. I felt the tip of the localizing wire medially and so I reexcised the medial margin as a separate specimen. The specimen mammogram showed both markers  both wires intact. Following closure and there was noted to be a mild to moderate volume loss laterally and centrally and medially the breast shape and contour was good.  Procedure in Detail:          Both localizing wires were placed this morning and seemed well placed. The patient was brought to the operating room and underwent general anesthesia with LMA device. The left breast was prepped and draped in a sterile fashion. Surgical time out was performed. Intravenous antibiotics were given. 0.5% Marcaine with epinephrine was used as local infiltration anesthetic.     I studied the position of the cancers and both wires. The best incision appeared to be a curvilinear circumareolar incision laterally in the breast. Dissection was carried into the breast tissue. Both  wires were brought into the wound. I dissected around both wires. I went widely around the  superior wire. Medially I just barely felt the tip of the wire, so I thought i might be close.  . The specimen was removed and marked with silk sutures and a 6 color ink kit.       We re-excised the medial margin and marked that with ink and sent that as a separate specimen. The specimen mammogram looked like we had removed both lesions. Specimen was sent to pathology. Hemostasis was excellent and achieved with electrocautery. The wound was irrigated with saline. The breast tissues were closed with interrupted sutures of  2-0 Vicryl and 3-0 Vicryl and the skin closed with a running 4-0 Monocryl suture and Dermabond. Dry bandages and a breast binder was placed. The patient tolerated the procedure well was taken to PACU in stable condition. EBL 20 cc or less. Counts correct. Complications none.     Edsel Petrin. Dalbert Batman, M.D., FACS General and Minimally Invasive Surgery Breast and Colorectal Surgery  09/19/2013 11:37 AM

## 2013-09-19 NOTE — Transfer of Care (Signed)
Immediate Anesthesia Transfer of Care Note  Patient: Briana Ochoa  Procedure(s) Performed: Procedure(s): LEFT BREAST LUMPECTOMY WITH DOUBLE WIRE BRACKETED  NEEDLE LOCALIZATION (Left)  Patient Location: PACU  Anesthesia Type:General  Level of Consciousness: awake, alert , oriented and patient cooperative  Airway & Oxygen Therapy: Patient Spontanous Breathing and Patient connected to nasal cannula oxygen  Post-op Assessment: Report given to PACU RN, Post -op Vital signs reviewed and stable and Patient moving all extremities  Post vital signs: Reviewed and stable  Complications: No apparent anesthesia complications

## 2013-09-19 NOTE — Anesthesia Procedure Notes (Signed)
Procedure Name: LMA Insertion Date/Time: 09/19/2013 10:27 AM Performed by: Julian Reil Pre-anesthesia Checklist: Patient identified, Emergency Drugs available, Suction available and Patient being monitored Patient Re-evaluated:Patient Re-evaluated prior to inductionOxygen Delivery Method: Circle system utilized Preoxygenation: Pre-oxygenation with 100% oxygen Intubation Type: IV induction Ventilation: Mask ventilation without difficulty LMA: LMA inserted LMA Size: 4.0 Tube type: Oral Number of attempts: 1 Placement Confirmation: positive ETCO2 and breath sounds checked- equal and bilateral Tube secured with: Tape Dental Injury: Teeth and Oropharynx as per pre-operative assessment

## 2013-09-19 NOTE — Anesthesia Postprocedure Evaluation (Signed)
  Anesthesia Post-op Note  Patient: Briana Ochoa  Procedure(s) Performed: Procedure(s): LEFT BREAST LUMPECTOMY WITH DOUBLE WIRE BRACKETED  NEEDLE LOCALIZATION (Left)  Patient Location: PACU  Anesthesia Type:General  Level of Consciousness: awake  Airway and Oxygen Therapy: Patient Spontanous Breathing  Post-op Pain: mild  Post-op Assessment: Post-op Vital signs reviewed  Post-op Vital Signs: Reviewed  Last Vitals:  Filed Vitals:   09/19/13 0917  BP: 155/68  Pulse: 84  Temp: 36.1 C  Resp: 18    Complications: No apparent anesthesia complications

## 2013-09-19 NOTE — Anesthesia Preprocedure Evaluation (Signed)
Anesthesia Evaluation  Patient identified by MRN, date of birth, ID band Patient awake    Reviewed: Allergy & Precautions, H&P , NPO status , Patient's Chart, lab work & pertinent test results  Airway Mallampati: II      Dental   Pulmonary former smoker,  breath sounds clear to auscultation        Cardiovascular hypertension, + Peripheral Vascular Disease Rhythm:Regular Rate:Normal     Neuro/Psych History noted. CE    GI/Hepatic negative GI ROS, Neg liver ROS,   Endo/Other  diabetes  Renal/GU Renal disease     Musculoskeletal   Abdominal   Peds  Hematology   Anesthesia Other Findings   Reproductive/Obstetrics                           Anesthesia Physical Anesthesia Plan  ASA: III  Anesthesia Plan: General   Post-op Pain Management:    Induction: Intravenous  Airway Management Planned: Oral ETT  Additional Equipment:   Intra-op Plan:   Post-operative Plan: Possible Post-op intubation/ventilation  Informed Consent: I have reviewed the patients History and Physical, chart, labs and discussed the procedure including the risks, benefits and alternatives for the proposed anesthesia with the patient or authorized representative who has indicated his/her understanding and acceptance.   Dental advisory given  Plan Discussed with: CRNA and Anesthesiologist  Anesthesia Plan Comments:         Anesthesia Quick Evaluation

## 2013-09-19 NOTE — Discharge Instructions (Signed)
What to eat:  For your first meals, you should eat lightly; only small meals initially.  If you do not have nausea, you may eat larger meals.  Avoid spicy, greasy and heavy food.    General Anesthesia, Adult, Care After  Refer to this sheet in the next few weeks. These instructions provide you with information on caring for yourself after your procedure. Your health care provider may also give you more specific instructions. Your treatment has been planned according to current medical practices, but problems sometimes occur. Call your health care provider if you have any problems or questions after your procedure.  WHAT TO EXPECT AFTER THE PROCEDURE  After the procedure, it is typical to experience:  Sleepiness.  Nausea and vomiting. HOME CARE INSTRUCTIONS  For the first 24 hours after general anesthesia:  Have a responsible person with you.  Do not drive a car. If you are alone, do not take public transportation.  Do not drink alcohol.  Do not take medicine that has not been prescribed by your health care provider.  Do not sign important papers or make important decisions.  You may resume a normal diet and activities as directed by your health care provider.  Change bandages (dressings) as directed.  If you have questions or problems that seem related to general anesthesia, call the hospital and ask for the anesthetist or anesthesiologist on call. SEEK MEDICAL CARE IF:  You have nausea and vomiting that continue the day after anesthesia.  You develop a rash. SEEK IMMEDIATE MEDICAL CARE IF:  You have difficulty breathing.  You have chest pain.  You have any allergic problems. Document Released: 05/02/2000 Document Revised: 09/26/2012 Document Reviewed: 08/09/2012  [facility] Patient Information 2014 [place], [place].        [place] Office Phone Number [phone]  BREAST BIOPSY/ PARTIAL MASTECTOMY: POST OP INSTRUCTIONS  Always review your discharge  instruction sheet given to you by the facility where your surgery was performed.  IF YOU HAVE DISABILITY OR FAMILY LEAVE FORMS, YOU MUST BRING THEM TO THE OFFICE FOR PROCESSING.  DO NOT GIVE THEM TO YOUR DOCTOR.  1. A prescription for pain medication may be given to you upon discharge.  Take your pain medication as prescribed, if needed.  If narcotic pain medicine is not needed, then you may take acetaminophen (Tylenol) or ibuprofen (Advil) as needed. 2. Take your usually prescribed medications unless otherwise directed 3. If you need a refill on your pain medication, please contact your pharmacy.  They will contact our office to request authorization.  Prescriptions will not be filled after 5pm or on week-ends. 4. You should eat very light the first 24 hours after surgery, such as soup, crackers, pudding, etc.  Resume your normal diet the day after surgery. 5. Most patients will experience some swelling and bruising in the breast.  Ice packs and a good support bra will help.  Swelling and bruising can take several days to resolve.  6. It is common to experience some constipation if taking pain medication after surgery.  Increasing fluid intake and taking a stool softener will usually help or prevent this problem from occurring.  A mild laxative (Milk of Magnesia or Miralax) should be taken according to package directions if there are no bowel movements after 48 hours. 7. Unless discharge instructions indicate otherwise, you may remove your bandages 24-48 hours after surgery, and you may shower at that time.  You may have steri-strips (small skin tapes) in place directly over  the incision.  These strips should be left on the skin for 7-10 days.  If your surgeon used skin glue on the incision, you may shower in 24 hours.  The glue will flake off over the next 2-3 weeks.  Any sutures or staples will be removed at the office during your follow-up visit. 8. ACTIVITIES:  You may resume regular daily activities  (gradually increasing) beginning the next day.  Wearing a good support bra or sports bra minimizes pain and swelling.  You may have sexual intercourse when it is comfortable. a. You may drive when you no longer are taking prescription pain medication, you can comfortably wear a seatbelt, and you can safely maneuver your car and apply brakes. b. RETURN TO WORK:  ______________________________________________________________________________________ 9. You should see your doctor in the office for a follow-up appointment approximately two weeks after your surgery.  Your doctors nurse will typically make your follow-up appointment when she calls you with your pathology report.  Expect your pathology report 2-3 business days after your surgery.  You may call to check if you do not hear from [place] after three days. 10. OTHER INSTRUCTIONS: _______________________________________________________________________________________________ _____________________________________________________________________________________________________________________________________ _____________________________________________________________________________________________________________________________________ _____________________________________________________________________________________________________________________________________  WHEN TO CALL YOUR DOCTOR: 1. Fever over 101.0 2. Nausea and/or vomiting. 3. Extreme swelling or bruising. 4. Continued bleeding from incision. 5. Increased pain, redness, or drainage from the incision.  The clinic staff is available to answer your questions during regular business hours.  Please dont hesitate to call and ask to speak to one of the nurses for clinical concerns.  If you have a medical emergency, go to the nearest emergency room or call 911.  A surgeon from [facility] is always on call at the hospital.  For further questions, please visit  [email or web address]

## 2013-09-19 NOTE — Interval H&P Note (Signed)
History and Physical Interval Note:  09/19/2013 9:33 AM  Briana Ochoa  has presented today for surgery, with the diagnosis of DCIS left breast   The various methods of treatment have been discussed with the patient and family. After consideration of risks, benefits and other options for treatment, the patient has consented to  Procedure(s): LEFT BREAST LUMPECTOMY WITH DOUBLE WIRE BRACKETED  NEEDLE LOCALIZATION (Left) as a surgical intervention .  The patient's history has been reviewed, patient examined, no change in status, stable for surgery.  I have reviewed the patient's chart and labs.  Questions were answered to the patient's satisfaction.     [name]

## 2013-09-20 ENCOUNTER — Encounter (HOSPITAL_COMMUNITY): Payer: Self-pay | Admitting: General Surgery

## 2013-09-23 NOTE — Progress Notes (Signed)
Quick Note:  Inform patient of Pathology report,.Tell her that the pathologist found 2 separate areas of ductal carcinoma in situ. The margins are negative and she will not need any further surgery. I will discuss this with her in detail at her next office visit.  hmi ______

## 2013-09-25 ENCOUNTER — Telehealth (INDEPENDENT_AMBULATORY_CARE_PROVIDER_SITE_OTHER): Payer: Self-pay

## 2013-09-25 NOTE — Telephone Encounter (Signed)
Pt notified of path result and po appt made.

## 2013-09-27 ENCOUNTER — Encounter: Payer: Self-pay | Admitting: *Deleted

## 2013-09-27 NOTE — Progress Notes (Addendum)
Location of Breast Cancer: Ductal carcinoma in situ, left breast, receptor positive. Multifocal, central and superior  Histology per Pathology Report: 09/23/2013 Diagnosis 1. Breast, lumpectomy, Left - DUCTAL CARCINOMA IN SITU, TWO FOCI ASSOCIATED WITH BIOPSY SITE REACTION. - MARGINS NOT INVOLVED. - FIBROCYSTIC CHANGES AND DUCTAL PAPILLOMA. 2. Breast, excision, left re-excision of medial margin - MILD FIBROCYSTIC CHANGES. - NO MALIGNANCY IDENTIFIED. Microscopic Comment 1. BREAST, IN SITU CARCINOMA Specimen, including laterality: Left breast. Procedure (include lymph node sampling sentinel-non-sentinel: Needle localized lumpectomy. Grade of carcinoma: Low grade. Necrosis: No. Estimated tumor size: (glass slide measurement): 0.2 and 0.3 cm Treatment effect: No. If present, treatment effect in breast tissue, lymph nodes or both: N/A Distance to closest margin: 0.5 cm from medial margin If margin positive, focally or broadly: N/A Breast prognostic profile: [id number] and [id number] Estrogen receptor: 100%, strong staining. Progesterone receptor: 100%, strong staining. Lymph nodes: Examined: 0 Sentinel 0 Non-sentinel 0 Total Lymph nodes with metastasis: N/A Isolated tumor cells (< 0.2 mm): N/A Micrometastasis ( > 0.2 mm and < 2.0 mm): N/A Macrometastasis (> 2.0 mm): N/A Extranodal extension: N/A TNM: pTis, pNX. Comments: There are two foci of biopsy site reaction both of which are associated with microscopic foci of residual ductal carcinoma in situ which does not involve the margins of the lumpectomy. (JDP:gt, 09/23/13) 1 of 2 FINAL for NEILA, TEEM ([id number]) Microscopic Comment(continued) Claudette Laws MD Pathologist, Electronic Signature (Case signed 09/23/2013)  Receptor Status: ER (+100%), PR (100%), Her2-neu ()  Did patient present with symptoms (if so, please note symptoms) or was this found on screening mammography?: She went for screening mammograms and  there was a 16 x 7 x 11 mm area of suspicious calcifications in the left breast centrally, deep to the nipple and areola. This area was biopsied and showed what appears to be intermediate grade DCIS, ER +100%, PR-positive 100%.    Past/Anticipated interventions by surgeon, if any: Left partial mastectomy with double wire bracketed needle localization 09/19/2013  Past/Anticipated interventions by medical oncology, if any: Chemotherapy.Not seen by oncologist.   Lymphedema issues, if any: No  Pain issues, if any:No  SAFETY ISSUES:  Prior radiation? no  Pacemaker/ICD? no  Possible current pregnancy? no  Is the patient on methotrexate? no  Current Complaints / other details:Widowed.Retired. Menses age 80. Children x 4 the first at age 89.No hormonal replacement therapy.No birth control.Last menstrual period age 89. NKDA    Jenene Slicker, RN 09/27/2013,3:19 PM

## 2013-10-03 ENCOUNTER — Encounter: Payer: Self-pay | Admitting: Radiation Oncology

## 2013-10-03 ENCOUNTER — Ambulatory Visit
Admission: RE | Admit: 2013-10-03 | Discharge: 2013-10-03 | Disposition: A | Discharge status: Home / Self Care | Payer: Medicare HMO | Source: Ambulatory Visit | Attending: Radiation Oncology | Admitting: Radiation Oncology

## 2013-10-03 VITALS — BP 131/70 | HR 85 | Temp 98.4°F | Wt 190.7 lb

## 2013-10-03 DIAGNOSIS — Z7982 Long term (current) use of aspirin: Secondary | ICD-10-CM | POA: Insufficient documentation

## 2013-10-03 DIAGNOSIS — D0590 Unspecified type of carcinoma in situ of unspecified breast: Secondary | ICD-10-CM | POA: Insufficient documentation

## 2013-10-03 DIAGNOSIS — Z170 Estrogen receptor positive status [ER+]: Secondary | ICD-10-CM | POA: Insufficient documentation

## 2013-10-03 DIAGNOSIS — C50112 Malignant neoplasm of central portion of left female breast: Secondary | ICD-10-CM

## 2013-10-03 DIAGNOSIS — Z510 Encounter for antineoplastic radiation therapy: Secondary | ICD-10-CM | POA: Diagnosis present

## 2013-10-03 NOTE — Progress Notes (Addendum)
   Department of Radiation Oncology  Phone:  [phone] Fax:        [phone]   Name: [name] MRN: [id number]  DOB: 14-Sep-1940  Date: 10/03/2013  Follow Up Visit Note  Diagnosis: DCIS of the left breast  Interval History: Briana Ochoa presents today for routine followup.  She had her lumpectomy on 8/17. This showed 2 foci of low grade ductal carcinoma in situ ( 75mm and 3 mm).  The margins were negative (closest 0.5cm) and the DCIS was strongly ER and PR positive. She has healed well from her surgery. Took one pain pill the night of her surgery.   Allergies: No Known Allergies  Medications:  Current Outpatient Prescriptions  Medication Sig Dispense Refill  . ACCU-CHEK SOFTCLIX LANCETS lancets 1 each daily as needed (to stick finger).       [name] glucose blood test strip Use as instructed  100 each  12  . lisinopril (PRINIVIL,ZESTRIL) 20 MG tablet Take 1 tablet (20 mg total) by mouth daily.  90 tablet  3  . Vitamin D, Ergocalciferol, (DRISDOL) 50000 UNITS CAPS capsule Take 50,000 Units by mouth every 30 (thirty) days. 4th of each month      . aspirin 81 MG tablet Take 81 mg by mouth daily.      [name] HYDROcodone-acetaminophen (NORCO) 5-325 MG per tablet Take 1-2 tablets by mouth every 6 (six) hours as needed.  30 tablet  0  . ondansetron (ZOFRAN-ODT) 4 MG disintegrating tablet        No current facility-administered medications for this encounter.    Physical Exam:  Filed Vitals:   10/03/13 1056  BP: 131/70  Pulse: 85  Temp: 98.4 F (36.9 C)  Weight: 190 lb 11.2 oz (86.501 kg)  SpO2: 96%   Palpable seroma on the left breast. Incision clean, dry and intact with no signs of infection. No pain with palpation.   IMPRESSION: Briana Ochoa is a 73 y.o. female s/p lumpectomy for DCIS   PLAN: I spoke to the patient today regarding her diagnosis and options for treatment. We discussed the role of radiation in decreasing local failures in patients who undergo lumpectomy. We discussed the  process of simulation and the placement tattoos. We discussed 4 weeks of treatment as an outpatient. We discussed the possibility of asymptomatic lung damage. We discussed the low likelihood of secondary malignancies. We discussed the possible side effects including but not limited to skin redness, fatigue, permanent skin darkening, and breast swelling.   She sees Dr. Dalbert Batman next week and I have scheduled her for simulation the same day. She will also need to be referred to medical oncology at some point for discussion of anti-estrogen treatment.   Thea Silversmith, MD

## 2013-10-03 NOTE — Progress Notes (Signed)
Please see the Nurse Progress Note in the MD Initial Consult Encounter for this patient. 

## 2013-10-03 NOTE — Addendum Note (Signed)
Encounter addended by: Arlyss Repress, RN on: 10/03/2013  2:26 PM<BR>     Documentation filed: Charges VN

## 2013-10-09 ENCOUNTER — Ambulatory Visit
Admission: RE | Admit: 2013-10-09 | Discharge: 2013-10-09 | Disposition: A | Discharge status: Home / Self Care | Payer: Medicare HMO | Source: Ambulatory Visit | Attending: Radiation Oncology | Admitting: Radiation Oncology

## 2013-10-09 ENCOUNTER — Other Ambulatory Visit (INDEPENDENT_AMBULATORY_CARE_PROVIDER_SITE_OTHER): Payer: Self-pay

## 2013-10-09 ENCOUNTER — Encounter (INDEPENDENT_AMBULATORY_CARE_PROVIDER_SITE_OTHER): Payer: Commercial Managed Care - HMO | Admitting: General Surgery

## 2013-10-09 DIAGNOSIS — Z510 Encounter for antineoplastic radiation therapy: Secondary | ICD-10-CM | POA: Diagnosis not present

## 2013-10-09 DIAGNOSIS — D0592 Unspecified type of carcinoma in situ of left breast: Secondary | ICD-10-CM

## 2013-10-10 ENCOUNTER — Encounter (INDEPENDENT_AMBULATORY_CARE_PROVIDER_SITE_OTHER): Payer: Commercial Managed Care - HMO | Admitting: General Surgery

## 2013-10-10 ENCOUNTER — Encounter: Payer: Self-pay | Admitting: Family Medicine

## 2013-10-10 NOTE — Telephone Encounter (Signed)
Pt responded to via mychart 

## 2013-10-11 DIAGNOSIS — Z510 Encounter for antineoplastic radiation therapy: Secondary | ICD-10-CM | POA: Diagnosis not present

## 2013-10-15 ENCOUNTER — Encounter (INDEPENDENT_AMBULATORY_CARE_PROVIDER_SITE_OTHER): Payer: Self-pay | Admitting: General Surgery

## 2013-10-15 DIAGNOSIS — Z510 Encounter for antineoplastic radiation therapy: Secondary | ICD-10-CM | POA: Diagnosis not present

## 2013-10-18 ENCOUNTER — Telehealth: Payer: Self-pay | Admitting: *Deleted

## 2013-10-18 ENCOUNTER — Ambulatory Visit
Admission: RE | Admit: 2013-10-18 | Discharge: 2013-10-18 | Disposition: A | Discharge status: Home / Self Care | Payer: Medicare HMO | Source: Ambulatory Visit | Attending: Radiation Oncology | Admitting: Radiation Oncology

## 2013-10-18 DIAGNOSIS — Z510 Encounter for antineoplastic radiation therapy: Secondary | ICD-10-CM | POA: Diagnosis not present

## 2013-10-18 DIAGNOSIS — C50112 Malignant neoplasm of central portion of left female breast: Secondary | ICD-10-CM

## 2013-10-18 NOTE — Telephone Encounter (Signed)
Called and confirmed 11/12/13 appt w/ pt.  Mailed before letter, welcoming packet, calendar and intake form to pt.  Emailed Engineer, civil (consulting) at [employer] to make her aware.

## 2013-10-21 ENCOUNTER — Ambulatory Visit
Admission: RE | Admit: 2013-10-21 | Discharge: 2013-10-21 | Disposition: A | Discharge status: Home / Self Care | Payer: Medicare HMO | Source: Ambulatory Visit | Attending: Radiation Oncology | Admitting: Radiation Oncology

## 2013-10-21 DIAGNOSIS — Z510 Encounter for antineoplastic radiation therapy: Secondary | ICD-10-CM | POA: Diagnosis not present

## 2013-10-22 ENCOUNTER — Ambulatory Visit
Admission: RE | Admit: 2013-10-22 | Discharge: 2013-10-22 | Disposition: A | Discharge status: Home / Self Care | Payer: Medicare HMO | Source: Ambulatory Visit | Attending: Radiation Oncology | Admitting: Radiation Oncology

## 2013-10-22 ENCOUNTER — Encounter: Payer: Self-pay | Admitting: Radiation Oncology

## 2013-10-22 VITALS — BP 146/76 | HR 96 | Resp 18 | Wt 192.5 lb

## 2013-10-22 DIAGNOSIS — Z510 Encounter for antineoplastic radiation therapy: Secondary | ICD-10-CM | POA: Diagnosis not present

## 2013-10-22 DIAGNOSIS — C50112 Malignant neoplasm of central portion of left female breast: Secondary | ICD-10-CM

## 2013-10-22 MED ORDER — ALRA NON-METALLIC DEODORANT (RAD-ONC)
1.0000 "application " | Freq: Once | TOPICAL | Status: AC
  Administered 2013-10-22: 1 via TOPICAL

## 2013-10-22 MED ORDER — RADIAPLEXRX EX GEL
Freq: Once | CUTANEOUS | Status: AC
  Administered 2013-10-22: 09:00:00 via TOPICAL

## 2013-10-22 NOTE — Progress Notes (Signed)
  Radiation Oncology         [phone]) [phone] ________________________________  Name: Briana Ochoa MRN: [id number]  Date: 10/18/2013  DOB: 29-Nov-1940  Simulation Verification Note  Status: outpatient  NARRATIVE: The patient was brought to the treatment unit and placed in the planned treatment position. The clinical setup was verified. Then port films were obtained and uploaded to the radiation oncology medical record software.  The treatment beams were carefully compared against the planned radiation fields. The position location and shape of the radiation fields was reviewed. The targeted volume of tissue appears appropriately covered by the radiation beams. Organs at risk appear to be excluded as planned.  Based on my personal review, I approved the simulation verification. The patient's treatment will proceed as planned.  ------------------------------------------------  Thea Silversmith, MD

## 2013-10-22 NOTE — Progress Notes (Signed)
Oriented patient to staff and routine of the clinic. Provided patient with RADIATION THERAPY AND YOU handbook then, reviewed pertinent information. Educated patient reference potential side effects and management such as, fatigue and skin changes. Provided patient with radiaplex and alra then, directed upon use. Allow patient to ask questions and answer them to the best of my ability. No skin changes noted. Denies fatigue or pain.

## 2013-10-22 NOTE — Progress Notes (Signed)
Weekly Management Note Current Dose: 5.34  Gy  Projected Dose: 52.72 Gy   Narrative:  The patient presents for routine under treatment assessment.  CBCT/MVCT images/Port film x-rays were reviewed.  The chart was checked. Doing well. No complaints except eye redness/soreness related to a damaged eyelid (chronic). RN education performed.   Physical Findings: Weight: 192 lb 8 oz (87.317 kg). Red left eye.   Impression:  The patient is tolerating radiation.  Plan:  Continue treatment as planned. Start radiaplex.

## 2013-10-23 ENCOUNTER — Ambulatory Visit
Admission: RE | Admit: 2013-10-23 | Discharge: 2013-10-23 | Disposition: A | Discharge status: Home / Self Care | Payer: Medicare HMO | Source: Ambulatory Visit | Attending: Radiation Oncology | Admitting: Radiation Oncology

## 2013-10-23 DIAGNOSIS — Z510 Encounter for antineoplastic radiation therapy: Secondary | ICD-10-CM | POA: Diagnosis not present

## 2013-10-23 MED ORDER — ALRA NON-METALLIC DEODORANT (RAD-ONC)
1.0000 "application " | Freq: Once | TOPICAL | Status: AC
  Administered 2013-10-23: 1 via TOPICAL

## 2013-10-23 MED ORDER — RADIAPLEXRX EX GEL
Freq: Once | CUTANEOUS | Status: AC
  Administered 2013-10-23: 10:00:00 via TOPICAL

## 2013-10-23 NOTE — Addendum Note (Signed)
Encounter addended by: Heywood Footman, RN on: 10/23/2013 10:16 AM<BR>     Documentation filed: Inpatient MAR

## 2013-10-23 NOTE — Addendum Note (Signed)
Encounter addended by: Heywood Footman, RN on: 10/23/2013 10:14 AM<BR>     Documentation filed: Inpatient Document Flowsheet, Orders

## 2013-10-24 ENCOUNTER — Ambulatory Visit
Admission: RE | Admit: 2013-10-24 | Discharge: 2013-10-24 | Disposition: A | Discharge status: Home / Self Care | Payer: Medicare HMO | Source: Ambulatory Visit | Attending: Radiation Oncology | Admitting: Radiation Oncology

## 2013-10-24 DIAGNOSIS — Z510 Encounter for antineoplastic radiation therapy: Secondary | ICD-10-CM | POA: Diagnosis not present

## 2013-10-25 ENCOUNTER — Ambulatory Visit
Admission: RE | Admit: 2013-10-25 | Discharge: 2013-10-25 | Disposition: A | Discharge status: Home / Self Care | Payer: Medicare HMO | Source: Ambulatory Visit | Attending: Radiation Oncology | Admitting: Radiation Oncology

## 2013-10-25 DIAGNOSIS — Z510 Encounter for antineoplastic radiation therapy: Secondary | ICD-10-CM | POA: Diagnosis not present

## 2013-10-28 ENCOUNTER — Ambulatory Visit
Admission: RE | Admit: 2013-10-28 | Discharge: 2013-10-28 | Disposition: A | Discharge status: Home / Self Care | Payer: Medicare HMO | Source: Ambulatory Visit | Attending: Radiation Oncology | Admitting: Radiation Oncology

## 2013-10-28 DIAGNOSIS — Z510 Encounter for antineoplastic radiation therapy: Secondary | ICD-10-CM | POA: Diagnosis not present

## 2013-10-29 ENCOUNTER — Ambulatory Visit
Admission: RE | Admit: 2013-10-29 | Discharge: 2013-10-29 | Disposition: A | Discharge status: Home / Self Care | Payer: Medicare HMO | Source: Ambulatory Visit | Attending: Radiation Oncology | Admitting: Radiation Oncology

## 2013-10-29 VITALS — BP 127/70 | HR 71 | Temp 98.5°F | Wt 192.3 lb

## 2013-10-29 DIAGNOSIS — C50112 Malignant neoplasm of central portion of left female breast: Secondary | ICD-10-CM

## 2013-10-29 DIAGNOSIS — Z510 Encounter for antineoplastic radiation therapy: Secondary | ICD-10-CM | POA: Diagnosis not present

## 2013-10-29 NOTE — Progress Notes (Signed)
Weekly assessment of radiation of left breast.treatment number 7 of 16.Skin pink with some occasional mild itching.Denies pain.

## 2013-10-29 NOTE — Progress Notes (Signed)
Weekly Management Note Current Dose: 18.69  Gy  Projected Dose: 42.72 Gy   Narrative:  The patient presents for routine under treatment assessment.  CBCT/MVCT images/Port film x-rays were reviewed.  The chart was checked. Doing well. No complaints.   Physical Findings: Weight: 192 lb 4.8 oz (87.227 kg). Unchanged. Surgical glue in place.  Impression:  The patient is tolerating radiation.  Plan:  Continue treatment as planned. Continue radiaplex.

## 2013-10-30 ENCOUNTER — Ambulatory Visit
Admission: RE | Admit: 2013-10-30 | Discharge: 2013-10-30 | Disposition: A | Discharge status: Home / Self Care | Payer: Medicare HMO | Source: Ambulatory Visit | Attending: Radiation Oncology | Admitting: Radiation Oncology

## 2013-10-30 DIAGNOSIS — Z510 Encounter for antineoplastic radiation therapy: Secondary | ICD-10-CM | POA: Diagnosis not present

## 2013-10-31 ENCOUNTER — Ambulatory Visit
Admission: RE | Admit: 2013-10-31 | Discharge: 2013-10-31 | Disposition: A | Discharge status: Home / Self Care | Payer: Medicare HMO | Source: Ambulatory Visit | Attending: Radiation Oncology | Admitting: Radiation Oncology

## 2013-10-31 DIAGNOSIS — Z510 Encounter for antineoplastic radiation therapy: Secondary | ICD-10-CM | POA: Diagnosis not present

## 2013-11-01 ENCOUNTER — Ambulatory Visit
Admission: RE | Admit: 2013-11-01 | Discharge: 2013-11-01 | Disposition: A | Discharge status: Home / Self Care | Payer: Medicare HMO | Source: Ambulatory Visit | Attending: Radiation Oncology | Admitting: Radiation Oncology

## 2013-11-01 DIAGNOSIS — Z510 Encounter for antineoplastic radiation therapy: Secondary | ICD-10-CM | POA: Diagnosis not present

## 2013-11-04 ENCOUNTER — Ambulatory Visit
Admission: RE | Admit: 2013-11-04 | Discharge: 2013-11-04 | Disposition: A | Discharge status: Home / Self Care | Payer: Medicare HMO | Source: Ambulatory Visit | Attending: Radiation Oncology | Admitting: Radiation Oncology

## 2013-11-04 DIAGNOSIS — Z510 Encounter for antineoplastic radiation therapy: Secondary | ICD-10-CM | POA: Diagnosis not present

## 2013-11-05 ENCOUNTER — Ambulatory Visit
Admission: RE | Admit: 2013-11-05 | Discharge: 2013-11-05 | Disposition: A | Discharge status: Home / Self Care | Payer: Medicare HMO | Source: Ambulatory Visit | Attending: Radiation Oncology | Admitting: Radiation Oncology

## 2013-11-05 DIAGNOSIS — Z510 Encounter for antineoplastic radiation therapy: Secondary | ICD-10-CM | POA: Diagnosis not present

## 2013-11-05 DIAGNOSIS — C50112 Malignant neoplasm of central portion of left female breast: Secondary | ICD-10-CM

## 2013-11-05 NOTE — Progress Notes (Signed)
Weekly Management Note Current Dose:  32.04 Gy  Projected Dose: 52.72 Gy   Narrative:  The patient presents for routine under treatment assessment.  CBCT/MVCT images/Port film x-rays were reviewed.  The chart was checked. Doing well. No complaints.   Physical Findings: Weight:  . Unchanged. Pink left breast.   Impression:  The patient is tolerating radiation.  Plan:  Continue treatment as planned. Discussed post treatment skin care. Discussed lotion with Viatmin E. Seeing medical oncology next week. Follow up in 1 month.

## 2013-11-05 NOTE — Progress Notes (Signed)
Weekly assessment of radiation to left breast.Completed 12 of 16 treatments.Has some breast tenderness.Skin pink.continue application of radiaplex.

## 2013-11-06 ENCOUNTER — Ambulatory Visit
Admission: RE | Admit: 2013-11-06 | Discharge: 2013-11-06 | Disposition: A | Discharge status: Home / Self Care | Payer: Medicare HMO | Source: Ambulatory Visit | Attending: Radiation Oncology | Admitting: Radiation Oncology

## 2013-11-06 DIAGNOSIS — Z510 Encounter for antineoplastic radiation therapy: Secondary | ICD-10-CM | POA: Diagnosis not present

## 2013-11-07 ENCOUNTER — Ambulatory Visit
Admission: RE | Admit: 2013-11-07 | Discharge: 2013-11-07 | Disposition: A | Discharge status: Home / Self Care | Payer: Commercial Managed Care - HMO | Source: Ambulatory Visit | Attending: Radiation Oncology | Admitting: Radiation Oncology

## 2013-11-07 DIAGNOSIS — C50112 Malignant neoplasm of central portion of left female breast: Secondary | ICD-10-CM | POA: Insufficient documentation

## 2013-11-07 DIAGNOSIS — Z170 Estrogen receptor positive status [ER+]: Secondary | ICD-10-CM | POA: Insufficient documentation

## 2013-11-07 DIAGNOSIS — Z510 Encounter for antineoplastic radiation therapy: Secondary | ICD-10-CM | POA: Diagnosis present

## 2013-11-08 ENCOUNTER — Ambulatory Visit
Admission: RE | Admit: 2013-11-08 | Discharge: 2013-11-08 | Disposition: A | Discharge status: Home / Self Care | Payer: Commercial Managed Care - HMO | Source: Ambulatory Visit | Attending: Radiation Oncology | Admitting: Radiation Oncology

## 2013-11-08 DIAGNOSIS — C50112 Malignant neoplasm of central portion of left female breast: Secondary | ICD-10-CM

## 2013-11-08 DIAGNOSIS — Z510 Encounter for antineoplastic radiation therapy: Secondary | ICD-10-CM | POA: Diagnosis not present

## 2013-11-08 MED ORDER — RADIAPLEXRX EX GEL
Freq: Once | CUTANEOUS | Status: AC
  Administered 2013-11-08: 17:00:00 via TOPICAL

## 2013-11-11 ENCOUNTER — Ambulatory Visit
Admission: RE | Admit: 2013-11-11 | Discharge: 2013-11-11 | Disposition: A | Discharge status: Home / Self Care | Payer: Commercial Managed Care - HMO | Source: Ambulatory Visit | Attending: Radiation Oncology | Admitting: Radiation Oncology

## 2013-11-11 ENCOUNTER — Encounter: Payer: Self-pay | Admitting: Radiation Oncology

## 2013-11-11 DIAGNOSIS — Z510 Encounter for antineoplastic radiation therapy: Secondary | ICD-10-CM | POA: Diagnosis not present

## 2013-11-12 ENCOUNTER — Ambulatory Visit (HOSPITAL_BASED_OUTPATIENT_CLINIC_OR_DEPARTMENT_OTHER): Payer: Commercial Managed Care - HMO | Admitting: Hematology and Oncology

## 2013-11-12 ENCOUNTER — Telehealth: Payer: Self-pay | Admitting: Hematology and Oncology

## 2013-11-12 ENCOUNTER — Ambulatory Visit: Payer: Commercial Managed Care - HMO

## 2013-11-12 ENCOUNTER — Ambulatory Visit: Payer: Medicare HMO

## 2013-11-12 ENCOUNTER — Encounter: Payer: Self-pay | Admitting: Hematology and Oncology

## 2013-11-12 VITALS — BP 159/84 | HR 76 | Temp 99.1°F | Resp 19 | Ht 64.0 in | Wt 191.8 lb

## 2013-11-12 DIAGNOSIS — Z170 Estrogen receptor positive status [ER+]: Secondary | ICD-10-CM

## 2013-11-12 DIAGNOSIS — C50112 Malignant neoplasm of central portion of left female breast: Secondary | ICD-10-CM

## 2013-11-12 DIAGNOSIS — D0512 Intraductal carcinoma in situ of left breast: Secondary | ICD-10-CM

## 2013-11-12 MED ORDER — ANASTROZOLE 1 MG PO TABS: 1.0000 mg | ORAL_TABLET | Freq: Every day | ORAL | Status: DC

## 2013-11-12 NOTE — Telephone Encounter (Signed)
sch pt appt-gave pt copy of sch

## 2013-11-12 NOTE — Progress Notes (Signed)
Note created by Dr. Gudena during office visit. Copy to patient, original to scan. 

## 2013-11-12 NOTE — Progress Notes (Signed)
Checked in new pt with no financial concerns at this time.  Pt has Raquel's card for any questions or concerns. ° °

## 2013-11-12 NOTE — Assessment & Plan Note (Signed)
DCIS left breast status post lumpectomy and radiation therapy. Patient is here to discuss adjuvant antiestrogen therapy options.  I discussed with her extensively about the details of pathology including the significance of difference between DCIS and invasive breast cancer. We discussed significance of ER PR positivity and the implications for treatment.  Antiestrogen therapy counseling: I recommended starting her on antiestrogen therapy with anastrozole 1 mg daily for 5 years. I discussed the difference between tamoxifen or anastrozole in terms of toxicities and benefits. I would like to obtain a DEXA scan for evaluation of bone density. We will see her back in one month and monitor for toxicities to Arimidex.

## 2013-11-12 NOTE — Progress Notes (Signed)
[facility] NOTE  Patient Care Team: Lucille Passy, MD as PCP - General (Family Medicine) Fanny Skates, MD as Consulting Physician (General Surgery) Thea Silversmith, MD as Consulting Physician (Radiation Oncology)  CHIEF COMPLAINTS/PURPOSE OF CONSULTATION:  Newly diagnosed DCIS left breast  HISTORY OF PRESENTING ILLNESS:  Briana Ochoa 73 y.o. female is here because of recent diagnosis of left breast DCIS. Patient was found to have calcification the left breast on screening mammogram which were biopsy-proven to be DCIS but there was additional area of calcifications noted which led to a second biopsy which also proved to be DCIS both biopsies are ER/PR 100% positive. She density 20 underwent lumpectomy on 09/19/2013 which showed 2 foci of a 0.2 and 0.3 cm respectively. She has recovered very well from surgery and completed radiation therapy to lumpectomy site with Dr. Pablo Ledger as of 11/11/2013. She is here today to discuss adjuvant therapy options for breast cancer risk reduction.  I reviewed her records extensively and collaborated the history with the patient.  SUMMARY OF ONCOLOGIC HISTORY:   Cancer of central portion of left female breast   08/20/2013 Initial Diagnosis Left breast 12:00: DCIS with calcifications, yet 100%, PR 100%; 09/04/2013 second group of calcifications also biopsied proven to be DCIS ER/PR positive   09/19/2013 Surgery Left breast lumpectomy DCIS 2 foci margins negative, 0.2 and 0.3 cm ER 100% PR 100%   10/22/2013 -  Radiation Therapy Adjuvant radiation therapy   MEDICAL HISTORY:  Past Medical History  Diagnosis Date  . Hypertension   . Diabetes mellitus without complication   . Heart murmur   . Arthritis   . Arthritis of knee   . Vertigo   . Chronic kidney disease     renal insuffic,   . Cancer     lt breast  . 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 Cesarean section  1964  . Cholecystectomy    . Splenectomy, total    . Breast biopsy      Left  . Finger surgery      lt thumb  from dog bite  . Breast lumpectomy with needle localization Left 09/19/2013    Procedure: LEFT BREAST LUMPECTOMY WITH DOUBLE WIRE BRACKETED  NEEDLE LOCALIZATION;  Surgeon: [name], MD;  Location: [facility];  Service: General;  Laterality: Left;    SOCIAL HISTORY: History   Social History  . Marital Status: Widowed    Spouse Name: N/A    Number of Children: N/A  . Years of Education: N/A   Occupational History  .      Retired   Social History Main Topics  . Smoking status: Former Smoker -- 1.50 packs/day    Types: Cigarettes    Quit date: 03/06/2003  . Smokeless tobacco: Never Used  . Alcohol Use: No  . Drug Use: No  . Sexual Activity: Not on file   Other Topics Concern  . Not on file   Social History Narrative   Widowed 2013   5 children   17 grandchildren      Does not have a living will-desires CPR, does not want prolonged life support if futile.    FAMILY HISTORY: Family History  Problem Relation Age of Onset  . Cancer Mother     kidney  . Heart attack Paternal Grandfather   . Breast cancer Maternal Aunt 80  .  Breast cancer Paternal Aunt 43    ALLERGIES:  has No Known Allergies.  MEDICATIONS:  Current Outpatient Prescriptions  Medication Sig Dispense Refill  . ACCU-CHEK SOFTCLIX LANCETS lancets 1 each daily as needed (to stick finger).       [name] glucose blood test strip Use as instructed  100 each  12  . lisinopril (PRINIVIL,ZESTRIL) 20 MG tablet Take 1 tablet (20 mg total) by mouth daily.  90 tablet  3  . non-metallic deodorant (ALRA) MISC Apply 1 application topically daily as needed.      . ondansetron (ZOFRAN-ODT) 4 MG disintegrating tablet       . Vitamin D, Ergocalciferol, (DRISDOL) 50000 UNITS CAPS capsule Take 50,000 Units by mouth every 30 (thirty) days. 4th of each  month      . Wound Cleansers (RADIAPLEX EX) Apply topically.      [name] anastrozole (ARIMIDEX) 1 MG tablet Take 1 tablet (1 mg total) by mouth daily.  30 tablet  0   No current facility-administered medications for this visit.    REVIEW OF SYSTEMS:   Constitutional: Denies fevers, chills or abnormal night sweats Eyes: Denies blurriness of vision, double vision or watery eyes Ears, nose, mouth, throat, and face: Denies mucositis or sore throat Respiratory: Denies cough, dyspnea or wheezes Cardiovascular: Denies palpitation, chest discomfort or lower extremity swelling Gastrointestinal:  Denies nausea, heartburn or change in bowel habits Skin: Denies abnormal skin rashes Lymphatics: Denies new lymphadenopathy or easy bruising Neurological:Denies numbness, tingling or new weaknesses Behavioral/Psych: Mood is stable, no new changes  Breast:  Soreness the breast from recent surgery and radiation All other systems were reviewed with the patient and are negative.  PHYSICAL EXAMINATION: ECOG PERFORMANCE STATUS: 1 - Symptomatic but completely ambulatory  Filed Vitals:   11/12/13 1504  BP: 159/84  Pulse: 76  Temp: 99.1 F (37.3 C)  Resp: 19   Filed Weights   11/12/13 1504  Weight: 191 lb 12.8 oz (87 kg)    GENERAL:alert, no distress and comfortable SKIN: skin color, texture, turgor are normal, no rashes or significant lesions EYES: normal, conjunctiva are pink and non-injected, sclera clear OROPHARYNX:no exudate, no erythema and lips, buccal mucosa, and tongue normal  NECK: supple, thyroid normal size, non-tender, without nodularity LYMPH:  no palpable lymphadenopathy in the cervical, axillary or inguinal LUNGS: clear to auscultation and percussion with normal breathing effort HEART: regular rate & rhythm and no murmurs and no lower extremity edema ABDOMEN:abdomen soft, non-tender and normal bowel sounds Musculoskeletal:no cyanosis of digits and no clubbing  PSYCH: alert & oriented x  3 with fluent speech NEURO: no focal motor/sensory deficits  LABORATORY DATA:  I have reviewed the data as listed Lab Results  Component Value Date   WBC 9.6 09/17/2013   HGB 16.3* 09/19/2013   HCT 48.0* 09/19/2013   MCV 92.7 09/17/2013   PLT 287 09/17/2013   Lab Results  Component Value Date   NA 140 09/19/2013   K 5.0 09/19/2013   CL 106 09/17/2013   CO2 24 09/17/2013    RADIOGRAPHIC STUDIES: I have personally reviewed the radiological reports and agreed with the findings in the report.  ASSESSMENT AND PLAN:  Cancer of central portion of left female breast DCIS left breast status post lumpectomy and radiation therapy. Patient is here to discuss adjuvant antiestrogen therapy options.  I discussed with her extensively about the details of pathology including the significance of difference between DCIS and invasive breast cancer. We discussed significance of ER PR  positivity and the implications for treatment.  Antiestrogen therapy counseling: I recommended starting her on antiestrogen therapy with anastrozole 1 mg daily for 5 years. I discussed the difference between tamoxifen or anastrozole in terms of toxicities and benefits. I would like to obtain a DEXA scan for evaluation of bone density. We will see her back in one month and monitor for toxicities to Arimidex.  All questions were answered. The patient knows to call the clinic with any problems, questions or concerns. I spent 40 minutes counseling the patient face to face. The total time spent in the appointment was 55 minutes and more than 50% was on counseling.     Rulon Eisenmenger, MD 11/12/2013 4:08 PM

## 2013-11-13 ENCOUNTER — Encounter: Payer: Self-pay | Admitting: Family Medicine

## 2013-11-13 ENCOUNTER — Ambulatory Visit: Payer: Medicare HMO

## 2013-11-14 ENCOUNTER — Ambulatory Visit: Payer: Medicare HMO

## 2013-11-15 ENCOUNTER — Ambulatory Visit: Payer: Medicare HMO

## 2013-11-18 ENCOUNTER — Ambulatory Visit: Payer: Medicare HMO

## 2013-11-18 NOTE — Progress Notes (Signed)
  Radiation Oncology         [phone]) [phone] ________________________________  Name: Briana Ochoa MRN: [id number]  Date: 11/11/2013  DOB: 01-05-1941  End of Treatment Note  Diagnosis:   DCIS of the left breast     Indication for treatment:  Curative       Radiation treatment dates:   10/21/2013-11/11/2013  Site/dose:     Left breast/ 42.72 Gy at 2.67 Gy per fraction x 21 fractions.   Beams/energy:  Opposed tangents with reduced fields / 6 MV photons  Narrative: The patient tolerated radiation treatment relatively well.   She had minimal dry desquamation which was treated with radiaplex.  Plan: The patient has completed radiation treatment. The patient will return to radiation oncology clinic for routine followup in one month. I advised them to call or return sooner if they have any questions or concerns related to their recovery or treatment.  ------------------------------------------------  Thea Silversmith, MD

## 2013-11-22 ENCOUNTER — Other Ambulatory Visit: Payer: Self-pay

## 2013-12-09 ENCOUNTER — Encounter: Payer: Self-pay | Admitting: Radiation Oncology

## 2013-12-10 ENCOUNTER — Ambulatory Visit (HOSPITAL_BASED_OUTPATIENT_CLINIC_OR_DEPARTMENT_OTHER): Payer: Commercial Managed Care - HMO | Admitting: Hematology and Oncology

## 2013-12-10 ENCOUNTER — Telehealth: Payer: Self-pay | Admitting: Hematology and Oncology

## 2013-12-10 VITALS — BP 158/75 | HR 70 | Temp 98.0°F | Resp 20 | Ht 64.0 in | Wt 189.7 lb

## 2013-12-10 DIAGNOSIS — C50112 Malignant neoplasm of central portion of left female breast: Secondary | ICD-10-CM

## 2013-12-10 DIAGNOSIS — D0512 Intraductal carcinoma in situ of left breast: Secondary | ICD-10-CM

## 2013-12-10 DIAGNOSIS — Z170 Estrogen receptor positive status [ER+]: Secondary | ICD-10-CM

## 2013-12-10 MED ORDER — ANASTROZOLE 1 MG PO TABS: 1.0000 mg | ORAL_TABLET | Freq: Every day | ORAL | Status: DC

## 2013-12-10 NOTE — Progress Notes (Signed)
Patient Care Team: Lucille Passy, MD as PCP - General (Family Medicine) Fanny Skates, MD as Consulting Physician (General Surgery) Thea Silversmith, MD as Consulting Physician (Radiation Oncology)  DIAGNOSIS: Cancer of central portion of left female breast   Staging form: Breast, AJCC 7th Edition     Clinical: Stage 0 (Tis (DCIS), N0, cM0) - Unsigned       Staging comments: Staged at breast conference 7.22.15      Pathologic: Stage 0 (Tis (DCIS)(2), N0, cM0) - Unsigned   SUMMARY OF ONCOLOGIC HISTORY:   Cancer of central portion of left female breast   08/20/2013 Initial Diagnosis Left breast 12:00: DCIS with calcifications, yet 100%, PR 100%; 09/04/2013 second group of calcifications also biopsied proven to be DCIS ER/PR positive   09/19/2013 Surgery Left breast lumpectomy DCIS 2 foci margins negative, 0.2 and 0.3 cm ER 100% PR 100%   10/08/2013 - 11/12/2013 Radiation Therapy Adjuvant radiation therapy   11/12/2013 -  Anti-estrogen oral therapy Anastrozole 1 mg daily plan is for 5 years    CHIEF COMPLIANT: followup on anastrozole  INTERVAL HISTORY: Briana Ochoa is a 73 year old Caucasian with above-mentioned history of DCIS involving the left breast treated with lumpectomy and radiation Patient started Arimidex since a month ago and is tolerating it very well without any major problems or concerns. She reports occasional hot flashes but denies myalgias or fatigue. She is scheduled to undergo bone density evaluation but she has not had that set up yet.the breast has healed very well from the effect of radiation. It is still slightly sensitive.  REVIEW OF SYSTEMS:   Constitutional: Denies fevers, chills or abnormal weight loss Eyes: Denies blurriness of vision Ears, nose, mouth, throat, and face: Denies mucositis or sore throat Respiratory: Denies cough, dyspnea or wheezes Cardiovascular: Denies palpitation, chest discomfort or lower extremity swelling Gastrointestinal:  Denies  nausea, heartburn or change in bowel habits Skin: Denies abnormal skin rashes Lymphatics: Denies new lymphadenopathy or easy bruising Neurological:Denies numbness, tingling or new weaknesses Behavioral/Psych: Mood is stable, no new changes  Breast:  denies any pain or lumps or nodules in either breasts All other systems were reviewed with the patient and are negative.  I have reviewed the past medical history, past surgical history, social history and family history with the patient and they are unchanged from previous note.  ALLERGIES:  has No Known Allergies.  MEDICATIONS:  Current Outpatient Prescriptions  Medication Sig Dispense Refill  . ACCU-CHEK SOFTCLIX LANCETS lancets 1 each daily as needed (to stick finger).     [name] anastrozole (ARIMIDEX) 1 MG tablet Take 1 tablet (1 mg total) by mouth daily. 90 tablet 6  . aspirin 325 MG tablet Take 325 mg by mouth daily.    . clopidogrel (PLAVIX) 75 MG tablet     . glucose blood test strip Use as instructed 100 each 12  . lisinopril (PRINIVIL,ZESTRIL) 20 MG tablet Take 1 tablet (20 mg total) by mouth daily. 90 tablet 3  . non-metallic deodorant (ALRA) MISC Apply 1 application topically daily as needed.    . ondansetron (ZOFRAN-ODT) 4 MG disintegrating tablet     . Vitamin D, Ergocalciferol, (DRISDOL) 50000 UNITS CAPS capsule Take 50,000 Units by mouth every 30 (thirty) days. 4th of each month    . Wound Cleansers (RADIAPLEX EX) Apply topically.     No current facility-administered medications for this visit.    PHYSICAL EXAMINATION: ECOG PERFORMANCE STATUS: 0 - Asymptomatic  Filed Vitals:   12/10/13 1696  BP: 158/75  Pulse: 70  Temp: 98 F (36.7 C)  Resp: 20   Filed Weights   12/10/13 0851  Weight: 189 lb 11.2 oz (86.047 kg)    GENERAL:alert, no distress and comfortable SKIN: skin color, texture, turgor are normal, no rashes or significant lesions EYES: normal, Conjunctiva are pink and non-injected, sclera clear OROPHARYNX:no  exudate, no erythema and lips, buccal mucosa, and tongue normal  NECK: supple, thyroid normal size, non-tender, without nodularity LYMPH:  no palpable lymphadenopathy in the cervical, axillary or inguinal LUNGS: clear to auscultation and percussion with normal breathing effort HEART: regular rate & rhythm and no murmurs and no lower extremity edema ABDOMEN:abdomen soft, non-tender and normal bowel sounds Musculoskeletal:no cyanosis of digits and no clubbing  NEURO: alert & oriented x 3 with fluent speech, no focal motor/sensory deficits  LABORATORY DATA:  I have reviewed the data as listed   Chemistry      Component Value Date/Time   NA 140 09/19/2013 0942   K 5.0 09/19/2013 0942   CL 106 09/17/2013 0926   CO2 24 09/17/2013 0926   BUN 25* 09/17/2013 0926   CREATININE 1.31* 09/17/2013 0926      Component Value Date/Time   CALCIUM 9.5 09/17/2013 0926   ALKPHOS 92 09/17/2013 0926   AST 18 09/17/2013 0926   ALT 12 09/17/2013 0926   BILITOT 0.3 09/17/2013 0926       Lab Results  Component Value Date   WBC 9.6 09/17/2013   HGB 16.3* 09/19/2013   HCT 48.0* 09/19/2013   MCV 92.7 09/17/2013   PLT 287 09/17/2013   NEUTROABS 6.0 09/17/2013    ASSESSMENT & PLAN:  Cancer of central portion of left female breast DCIS left breast status post lumpectomy and radiation ER PR positive, Currently on Arimidex 1 mg daily  Aromatase inhibitor counseling: Patient is tolerating anastrozole extremely well without any major problems or concerns. In order to ensure bone health, developing a DEXA scan for evaluation of bone density. She has been asked to continue with calcium and vitamin D supplementation.  Surveillance: Once a year mammograms and periodic physical exams are recommended. Return to clinic in 3 months for followup.     No orders of the defined types were placed in this encounter.   The patient has a good understanding of the overall plan. she agrees with it. She will call with  any problems that may develop before her next visit here.  I spent 15 minutes counseling the patient face to face. The total time spent in the appointment was 15 minutes and more than 50% was on counseling and review of test results    Rulon Eisenmenger, MD 12/10/2013 9:16 AM

## 2013-12-10 NOTE — Telephone Encounter (Signed)
per pof to sch pt appt-gave pt copy of sch °

## 2013-12-10 NOTE — Assessment & Plan Note (Signed)
DCIS left breast status post lumpectomy and radiation ER PR positive, Currently on Arimidex 1 mg daily  Aromatase inhibitor counseling: Patient is tolerating anastrozole extremely well without any major problems or concerns. In order to ensure bone health, developing a DEXA scan for evaluation of bone density. She has been asked to continue with calcium and vitamin D supplementation.  Surveillance: Once a year mammograms and periodic physical exams are recommended. Return to clinic in 3 months for followup.

## 2013-12-11 ENCOUNTER — Other Ambulatory Visit (HOSPITAL_COMMUNITY): Payer: Self-pay | Admitting: Neurosurgery

## 2013-12-11 DIAGNOSIS — I729 Aneurysm of unspecified site: Secondary | ICD-10-CM

## 2013-12-12 ENCOUNTER — Ambulatory Visit
Admission: RE | Admit: 2013-12-12 | Discharge: 2013-12-12 | Disposition: A | Discharge status: Home / Self Care | Payer: Medicare HMO | Source: Ambulatory Visit | Attending: Radiation Oncology | Admitting: Radiation Oncology

## 2013-12-12 VITALS — BP 135/68 | HR 77 | Temp 98.3°F | Wt 191.0 lb

## 2013-12-12 DIAGNOSIS — C50112 Malignant neoplasm of central portion of left female breast: Secondary | ICD-10-CM

## 2013-12-12 NOTE — Progress Notes (Signed)
Patient for routine one month follow up completion of radiation to left breast.Skin looks good.taking arimidex.Scheduled for brain aneurysm surgery in December 2015.

## 2013-12-13 ENCOUNTER — Encounter: Payer: Self-pay | Admitting: Radiation Oncology

## 2013-12-13 NOTE — Progress Notes (Signed)
   Department of Radiation Oncology  Phone:  [phone] Fax:        [phone]   Name: Briana Ochoa MRN: [id number]  DOB: [birth year]  Date: 12/12/2013  Follow Up Visit Note  Diagnosis: Cancer of central portion of left female breast   Staging form: Breast, AJCC 7th Edition     Clinical: Stage 0 (Tis (DCIS), N0, cM0) - Unsigned       Staging comments: Staged at breast conference 7.22.15      Pathologic: Stage 0 (Tis (DCIS)(2), N0, cM0) - Unsigned   Summary and Interval since last radiation: 1 month from 42.72 Gy completed 11/11/13  Interval History: Briana Ochoa presents today for routine followup.  She is doing well and feeling well. She has started Arimidex and may have had a few hot flashes but really tolerating it well. She is pleased with her cosmetic result.  She has no breast related complaints.   Physical Exam:  Filed Vitals:   12/12/13 1520  BP: 135/68  Pulse: 77  Temp: 98.3 F (36.8 C)  Weight: 191 lb (86.637 kg)   Well healed skin. Minimal hyperpigmentation.   IMPRESSION: Briana Ochoa is a 73 y.o. female s/p breast conservation with resolving acute effects of treatment  PLAN:  She is doing well. We discussed the need for follow up every 4-6 months which she has scheduled.  We discussed the need for yearly mammograms which she can schedule with her OBGYN or with medical oncology. We discussed the need for sun protection in the treated area.  She can always call me with questions.  I will follow up with her on an as needed basis.   She asked for the name of our cancer center leader to send a letter of appreciation.  I gave her our Vice President's name.     Thea Silversmith, MD

## 2013-12-14 NOTE — Progress Notes (Signed)
Name: Briana Ochoa   MRN: [id number]  Date:  10/09/13  DOB: [birth year]  Status:outpatient    DIAGNOSIS: Breast cancer.  CONSENT VERIFIED: yes   SET UP: Patient is setup supine   IMMOBILIZATION:  The following immobilization was used:Custom Moldable Pillow, breast board.   NARRATIVE: Briana Ochoa was brought to the [facility].  Identity was confirmed.  All relevant records and images related to the planned course of therapy were reviewed.  Then, the patient was positioned in a stable reproducible clinical set-up for radiation therapy.  Wires were placed to delineate the clinical extent of breast tissue. A wire was placed on the scar as well.  CT images were obtained.  An isocenter was placed. Skin markings were placed.  The CT images were loaded into the planning software where the target and avoidance structures were contoured.  The radiation prescription was entered and confirmed. The patient was discharged in stable condition and tolerated simulation well.    TREATMENT PLANNING NOTE:  Treatment planning then occurred. I have requested : MLC's, isodose plan, basic dose calculation  I personally designed and supervised the construction of 3 medically necessary complex treatment devices for the protection of critical normal structures including the lungs and contralateral breast as well as the immobilization device which is necessary for set up certainty.   3D simulation was performed with dose volume histogram created of the heart, lung and lumpectomy cavity.

## 2013-12-14 NOTE — Progress Notes (Signed)
Radiation Oncology         [phone]) [phone] ________________________________  Name: Briana Ochoa      MRN: [id number]          Date: 10/09/13              DOB: May 28, 1940  Optical Surface Tracking Plan:  Since intensity modulated radiotherapy (IMRT) and 3D conformal radiation treatment methods are predicated on accurate and precise positioning for treatment, intrafraction motion monitoring is medically necessary to ensure accurate and safe treatment delivery.  The ability to quantify intrafraction motion without excessive ionizing radiation dose can only be performed with optical surface tracking. Accordingly, surface imaging offers the opportunity to obtain 3D measurements of patient position throughout IMRT and 3D treatments without excessive radiation exposure.  I am ordering optical surface tracking for this patient's upcoming course of radiotherapy. ________________________________ Signature   Reference:   Ursula Alert, J, et al. Surface imaging-based analysis of intrafraction motion for breast radiotherapy patients.Journal of [place], n. 6, nov. 2014. ISSN [id number].   Available at: <http://www.jacmp.org/index.php/jacmp/article/view/4957>.

## 2013-12-16 ENCOUNTER — Encounter: Payer: Self-pay | Admitting: *Deleted

## 2013-12-16 ENCOUNTER — Encounter: Payer: Self-pay | Admitting: Hematology and Oncology

## 2013-12-16 NOTE — Telephone Encounter (Signed)
Sent patient a message that she may proceed with Flu vaccine.  Not receiving any chemotherapy.

## 2013-12-17 ENCOUNTER — Ambulatory Visit (INDEPENDENT_AMBULATORY_CARE_PROVIDER_SITE_OTHER): Payer: Commercial Managed Care - HMO

## 2013-12-17 DIAGNOSIS — Z23 Encounter for immunization: Secondary | ICD-10-CM

## 2013-12-18 LAB — HM DIABETES EYE EXAM

## 2013-12-24 ENCOUNTER — Encounter: Payer: Self-pay | Admitting: Family Medicine

## 2013-12-31 ENCOUNTER — Encounter (HOSPITAL_COMMUNITY): Payer: Self-pay

## 2013-12-31 ENCOUNTER — Encounter (HOSPITAL_COMMUNITY)
Admission: RE | Admit: 2013-12-31 | Discharge: 2013-12-31 | Disposition: A | Discharge status: Home / Self Care | Payer: Commercial Managed Care - HMO | Source: Ambulatory Visit | Attending: Neurosurgery | Admitting: Neurosurgery

## 2013-12-31 DIAGNOSIS — Z01812 Encounter for preprocedural laboratory examination: Secondary | ICD-10-CM | POA: Insufficient documentation

## 2013-12-31 LAB — CBC
HCT: 47.6 % — ABNORMAL HIGH (ref 36.0–46.0)
Hemoglobin: 16.1 g/dL — ABNORMAL HIGH (ref 12.0–15.0)
MCH: 31.2 pg (ref 26.0–34.0)
MCHC: 33.8 g/dL (ref 30.0–36.0)
MCV: 92.2 fL (ref 78.0–100.0)
Platelets: 295 10*3/uL (ref 150–400)
RBC: 5.16 MIL/uL — ABNORMAL HIGH (ref 3.87–5.11)
RDW: 13.6 % (ref 11.5–15.5)
WBC: 9 10*3/uL (ref 4.0–10.5)

## 2013-12-31 LAB — BASIC METABOLIC PANEL
Anion gap: 14 (ref 5–15)
BUN: 22 mg/dL (ref 6–23)
CO2: 24 mEq/L (ref 19–32)
Calcium: 9.8 mg/dL (ref 8.4–10.5)
Chloride: 103 mEq/L (ref 96–112)
Creatinine, Ser: 1.18 mg/dL — ABNORMAL HIGH (ref 0.50–1.10)
GFR calc Af Amer: 52 mL/min — ABNORMAL LOW (ref >90)
GFR calc non Af Amer: 45 mL/min — ABNORMAL LOW (ref >90)
Glucose, Bld: 96 mg/dL (ref 70–99)
Potassium: 4.7 mEq/L (ref 3.7–5.3)
Sodium: 141 mEq/L (ref 137–147)

## 2013-12-31 LAB — APTT: aPTT: 39 seconds — ABNORMAL HIGH (ref 24–37)

## 2013-12-31 LAB — PROTIME-INR
INR: 0.97 (ref 0.00–1.49)
Prothrombin Time: 13 seconds (ref 11.6–15.2)

## 2013-12-31 MED ORDER — FENTANYL CITRATE 0.05 MG/ML IJ SOLN
INTRAMUSCULAR | Status: AC
  Filled 2013-12-31: qty 5

## 2013-12-31 MED ORDER — DEXAMETHASONE SODIUM PHOSPHATE 4 MG/ML IJ SOLN
INTRAMUSCULAR | Status: AC
  Filled 2013-12-31: qty 2

## 2013-12-31 MED ORDER — ROCURONIUM BROMIDE 50 MG/5ML IV SOLN
INTRAVENOUS | Status: AC
  Filled 2013-12-31: qty 1

## 2013-12-31 MED ORDER — LIDOCAINE HCL (CARDIAC) 20 MG/ML IV SOLN
INTRAVENOUS | Status: AC
  Filled 2013-12-31: qty 10

## 2013-12-31 MED ORDER — PROPOFOL 10 MG/ML IV BOLUS
INTRAVENOUS | Status: AC
  Filled 2013-12-31: qty 20

## 2013-12-31 MED ORDER — MIDAZOLAM HCL 2 MG/2ML IJ SOLN
INTRAMUSCULAR | Status: AC
  Filled 2013-12-31: qty 2

## 2013-12-31 NOTE — Progress Notes (Signed)
Call to Manuela Schwartz at Dr. Cleotilde Neer , confirmed for pt.  to take asa & Plavix, day of procedure.

## 2013-12-31 NOTE — Pre-Procedure Instructions (Signed)
Briana Ochoa  12/31/2013   Your procedure is scheduled on: Thursday, January 09, 2014  Report to [facility] Admitting at 9:00 AM.  Call this number if you have problems the morning of surgery: [phone]   Remember:   Do not eat food or drink liquids after midnight Wednesday, January 08, 2014   Take these medicines the morning of surgery with A SIP OF WATER: anastrozole (ARIMIDEX), aspirin, clopidogrel (PLAVIX), if needed:Hypromellose (ARTIFICIAL TEARS ) eye drops  DO NOT TAKE ANY DIABETIC MEDICATIONS ON THE MORNING OF PROCEDURE  Stop taking vitamins and herbal medications. Do not take any NSAIDs ie: Ibuprofen, Advil, Naproxen or etc.; stop 5 days prior to procedure (Saturday, January 04, 2014).   Do not wear jewelry, make-up or nail polish.  Do not wear lotions, powders, or perfumes. You may not wear deodorant.  Do not shave 48 hours prior to surgery.   Do not bring valuables to the hospital.  [facility] is not responsible for any belongings or valuables.               Contacts, dentures or bridgework may not be worn into surgery.  Leave suitcase in the car. After surgery it may be brought to your room.  For patients admitted to the hospital, discharge time is determined by your treatment team.               Patients discharged the day of surgery will not be allowed to drive home.  Name and phone number of your driver:   Special Instructions:  Special Instructions:Special Instructions: [facility] - Preparing for Surgery  Before surgery, you can play an important role.  Because skin is not sterile, your skin needs to be as free of germs as possible.  You can reduce the number of germs on you skin by washing with CHG (chlorahexidine gluconate) soap before surgery.  CHG is an antiseptic cleaner which kills germs and bonds with the skin to continue killing germs even after washing.  Please DO NOT use if you have an allergy to CHG or antibacterial soaps.  If your  skin becomes reddened/irritated stop using the CHG and inform your nurse when you arrive at Short Stay.  Do not shave (including legs and underarms) for at least 48 hours prior to the first CHG shower.  You may shave your face.  Please follow these instructions carefully:   1.  Shower with CHG Soap the night before surgery and the morning of Surgery.  2.  If you choose to wash your hair, wash your hair first as usual with your normal shampoo.  3.  After you shampoo, rinse your hair and body thoroughly to remove the Shampoo.  4.  Use CHG as you would any other liquid soap.  You can apply chg directly  to the skin and wash gently with scrungie or a clean washcloth.  5.  Apply the CHG Soap to your body ONLY FROM THE NECK DOWN.  Do not use on open wounds or open sores.  Avoid contact with your eyes, ears, mouth and genitals (private parts).  Wash genitals (private parts) with your normal soap.  6.  Wash thoroughly, paying special attention to the area where your surgery will be performed.  7.  Thoroughly rinse your body with warm water from the neck down.  8.  DO NOT shower/wash with your normal soap after using and rinsing off the CHG Soap.  9.  Pat yourself dry with a  clean towel.            10.  Wear clean pajamas.            11.  Place clean sheets on your bed the night of your first shower and do not sleep with pets.  Day of Surgery  Do not apply any lotions/deodorants the morning of surgery.  Please wear clean clothes to the hospital/surgery center.   Please read over the following fact sheets that you were given: Pain Booklet and Surgical Site Infection Prevention

## 2014-01-08 MED ORDER — CEFAZOLIN SODIUM-DEXTROSE 2-3 GM-% IV SOLR
2.0000 g | INTRAVENOUS | Status: AC
  Administered 2014-01-09: 2 g via INTRAVENOUS

## 2014-01-09 ENCOUNTER — Inpatient Hospital Stay (HOSPITAL_COMMUNITY): Payer: Commercial Managed Care - HMO | Admitting: Certified Registered"

## 2014-01-09 ENCOUNTER — Encounter (HOSPITAL_COMMUNITY): Payer: Self-pay | Admitting: *Deleted

## 2014-01-09 ENCOUNTER — Encounter (HOSPITAL_COMMUNITY)
Admission: RE | Disposition: A | Discharge status: Home / Self Care | Payer: Self-pay | Source: Ambulatory Visit | Attending: Neurosurgery

## 2014-01-09 ENCOUNTER — Inpatient Hospital Stay (HOSPITAL_COMMUNITY)
Admission: RE | Admit: 2014-01-09 | Discharge: 2014-01-10 | DRG: 027 | Disposition: A | Discharge status: Home / Self Care | Payer: Commercial Managed Care - HMO | Source: Ambulatory Visit | Attending: Neurosurgery | Admitting: Neurosurgery

## 2014-01-09 ENCOUNTER — Inpatient Hospital Stay (HOSPITAL_COMMUNITY)
Admission: RE | Admit: 2014-01-09 | Discharge: 2014-01-09 | Disposition: A | Discharge status: Home / Self Care | Payer: Commercial Managed Care - HMO | Source: Ambulatory Visit | Attending: Neurosurgery | Admitting: Neurosurgery

## 2014-01-09 VITALS — BP 155/74 | HR 87 | Temp 97.8°F | Resp 20 | Ht 64.0 in | Wt 192.0 lb

## 2014-01-09 DIAGNOSIS — Z6832 Body mass index (BMI) 32.0-32.9, adult: Secondary | ICD-10-CM

## 2014-01-09 DIAGNOSIS — I671 Cerebral aneurysm, nonruptured: Secondary | ICD-10-CM | POA: Diagnosis present

## 2014-01-09 DIAGNOSIS — I10 Essential (primary) hypertension: Secondary | ICD-10-CM | POA: Diagnosis present

## 2014-01-09 DIAGNOSIS — I739 Peripheral vascular disease, unspecified: Secondary | ICD-10-CM | POA: Diagnosis present

## 2014-01-09 DIAGNOSIS — Z7902 Long term (current) use of antithrombotics/antiplatelets: Secondary | ICD-10-CM

## 2014-01-09 DIAGNOSIS — Z923 Personal history of irradiation: Secondary | ICD-10-CM | POA: Diagnosis not present

## 2014-01-09 DIAGNOSIS — Z87891 Personal history of nicotine dependence: Secondary | ICD-10-CM

## 2014-01-09 DIAGNOSIS — C50919 Malignant neoplasm of unspecified site of unspecified female breast: Secondary | ICD-10-CM | POA: Diagnosis present

## 2014-01-09 DIAGNOSIS — Z7982 Long term (current) use of aspirin: Secondary | ICD-10-CM | POA: Diagnosis not present

## 2014-01-09 DIAGNOSIS — E119 Type 2 diabetes mellitus without complications: Secondary | ICD-10-CM | POA: Diagnosis present

## 2014-01-09 DIAGNOSIS — I729 Aneurysm of unspecified site: Secondary | ICD-10-CM

## 2014-01-09 HISTORY — PX: RADIOLOGY WITH ANESTHESIA: SHX6223

## 2014-01-09 LAB — CBC
HCT: 43.4 % (ref 36.0–46.0)
Hemoglobin: 14.4 g/dL (ref 12.0–15.0)
MCH: 29.9 pg (ref 26.0–34.0)
MCHC: 33.2 g/dL (ref 30.0–36.0)
MCV: 90.2 fL (ref 78.0–100.0)
Platelets: 304 10*3/uL (ref 150–400)
RBC: 4.81 MIL/uL (ref 3.87–5.11)
RDW: 13.8 % (ref 11.5–15.5)
WBC: 9.1 10*3/uL (ref 4.0–10.5)

## 2014-01-09 LAB — GLUCOSE, CAPILLARY
Glucose-Capillary: 148 mg/dL — ABNORMAL HIGH (ref 70–99)
Glucose-Capillary: 118 mg/dL — ABNORMAL HIGH (ref 70–99)
Glucose-Capillary: 105 mg/dL — ABNORMAL HIGH (ref 70–99)

## 2014-01-09 LAB — CREATININE, SERUM
Creatinine, Ser: 1.02 mg/dL (ref 0.50–1.10)
GFR calc Af Amer: 62 mL/min — ABNORMAL LOW (ref >90)
GFR calc non Af Amer: 53 mL/min — ABNORMAL LOW (ref >90)

## 2014-01-09 SURGERY — RADIOLOGY WITH ANESTHESIA
Anesthesia: General

## 2014-01-09 MED ORDER — ONDANSETRON HCL 4 MG/2ML IJ SOLN: 4.0000 mg | INTRAMUSCULAR | Status: DC | PRN

## 2014-01-09 MED ORDER — LABETALOL HCL 5 MG/ML IV SOLN: 10.0000 mg | INTRAVENOUS | Status: DC | PRN

## 2014-01-09 MED ORDER — ONDANSETRON HCL 4 MG/2ML IJ SOLN
INTRAMUSCULAR | Status: DC | PRN
  Administered 2014-01-09: 4 mg via INTRAVENOUS

## 2014-01-09 MED ORDER — ANASTROZOLE 1 MG PO TABS
1.0000 mg | ORAL_TABLET | Freq: Every day | ORAL | Status: DC
  Filled 2014-01-09: qty 1

## 2014-01-09 MED ORDER — SODIUM CHLORIDE 0.9 % IV SOLN
INTRAVENOUS | Status: DC | PRN
  Administered 2014-01-09: 13:00:00 via INTRAVENOUS

## 2014-01-09 MED ORDER — MORPHINE SULFATE 2 MG/ML IJ SOLN: 1.0000 mg | INTRAMUSCULAR | Status: DC | PRN

## 2014-01-09 MED ORDER — FENTANYL CITRATE 0.05 MG/ML IJ SOLN: 25.0000 ug | INTRAMUSCULAR | Status: DC | PRN

## 2014-01-09 MED ORDER — MIDAZOLAM HCL 2 MG/2ML IJ SOLN: 0.5000 mg | Freq: Once | INTRAMUSCULAR | Status: DC | PRN

## 2014-01-09 MED ORDER — OXYCODONE HCL 5 MG PO TABS: 5.0000 mg | ORAL_TABLET | Freq: Once | ORAL | Status: DC | PRN

## 2014-01-09 MED ORDER — CEFAZOLIN SODIUM-DEXTROSE 2-3 GM-% IV SOLR
INTRAVENOUS | Status: AC
Start: 2014-01-09 — End: 2014-01-10
  Filled 2014-01-09: qty 50

## 2014-01-09 MED ORDER — DOCUSATE SODIUM 100 MG PO CAPS
100.0000 mg | ORAL_CAPSULE | Freq: Two times a day (BID) | ORAL | Status: DC
  Filled 2014-01-09: qty 1

## 2014-01-09 MED ORDER — HYDROCODONE-ACETAMINOPHEN 5-325 MG PO TABS: 1.0000 | ORAL_TABLET | ORAL | Status: DC | PRN

## 2014-01-09 MED ORDER — ASPIRIN 325 MG PO TABS: 325.0000 mg | ORAL_TABLET | Freq: Every day | ORAL | Status: DC

## 2014-01-09 MED ORDER — LIDOCAINE HCL (CARDIAC) 20 MG/ML IV SOLN
INTRAVENOUS | Status: DC | PRN
  Administered 2014-01-09: 20 mg via INTRAVENOUS

## 2014-01-09 MED ORDER — CLOPIDOGREL BISULFATE 75 MG PO TABS: 75.0000 mg | ORAL_TABLET | Freq: Every day | ORAL | Status: DC

## 2014-01-09 MED ORDER — ARTIFICIAL TEARS OP OINT
TOPICAL_OINTMENT | Freq: Every evening | OPHTHALMIC | Status: DC | PRN
  Filled 2014-01-09: qty 3.5

## 2014-01-09 MED ORDER — PROMETHAZINE HCL 25 MG PO TABS: 12.5000 mg | ORAL_TABLET | ORAL | Status: DC | PRN

## 2014-01-09 MED ORDER — DEXTROSE 5 % IV SOLN
10.0000 mg | INTRAVENOUS | Status: DC | PRN
  Administered 2014-01-09: 20 ug/min via INTRAVENOUS

## 2014-01-09 MED ORDER — HEPARIN SODIUM (PORCINE) 1000 UNIT/ML IJ SOLN
INTRAMUSCULAR | Status: DC | PRN
  Administered 2014-01-09: 5000 [IU] via INTRAVENOUS
  Administered 2014-01-09: 1000 [IU] via INTRAVENOUS

## 2014-01-09 MED ORDER — PROMETHAZINE HCL 25 MG/ML IJ SOLN: 6.2500 mg | INTRAMUSCULAR | Status: DC | PRN

## 2014-01-09 MED ORDER — MEPERIDINE HCL 25 MG/ML IJ SOLN: 6.2500 mg | INTRAMUSCULAR | Status: DC | PRN

## 2014-01-09 MED ORDER — PANTOPRAZOLE SODIUM 40 MG IV SOLR: 40.0000 mg | Freq: Every day | INTRAVENOUS | Status: DC

## 2014-01-09 MED ORDER — MECLIZINE HCL 12.5 MG PO TABS
12.5000 mg | ORAL_TABLET | Freq: Three times a day (TID) | ORAL | Status: DC | PRN
  Filled 2014-01-09: qty 1

## 2014-01-09 MED ORDER — ROCURONIUM BROMIDE 100 MG/10ML IV SOLN
INTRAVENOUS | Status: DC | PRN
  Administered 2014-01-09: 50 mg via INTRAVENOUS

## 2014-01-09 MED ORDER — DOCUSATE SODIUM 100 MG PO CAPS
100.0000 mg | ORAL_CAPSULE | Freq: Two times a day (BID) | ORAL | Status: DC
Start: 2014-01-09 — End: 2014-01-09

## 2014-01-09 MED ORDER — IOHEXOL 300 MG/ML  SOLN
150.0000 mL | Freq: Once | INTRAMUSCULAR | Status: AC | PRN
  Administered 2014-01-09: 50 mL via INTRAVENOUS

## 2014-01-09 MED ORDER — SODIUM CHLORIDE 0.9 % IV SOLN: INTRAVENOUS | Status: DC

## 2014-01-09 MED ORDER — GLYCOPYRROLATE 0.2 MG/ML IJ SOLN
INTRAMUSCULAR | Status: DC | PRN
  Administered 2014-01-09: 0.4 mg via INTRAVENOUS
  Administered 2014-01-09: 0.6 mg via INTRAVENOUS

## 2014-01-09 MED ORDER — PANTOPRAZOLE SODIUM 40 MG IV SOLR
40.0000 mg | Freq: Every day | INTRAVENOUS | Status: DC
  Filled 2014-01-09: qty 40

## 2014-01-09 MED ORDER — DEXAMETHASONE SODIUM PHOSPHATE 4 MG/ML IJ SOLN
INTRAMUSCULAR | Status: DC | PRN
  Administered 2014-01-09: 8 mg via INTRAVENOUS

## 2014-01-09 MED ORDER — ONDANSETRON 4 MG PO TBDP
4.0000 mg | ORAL_TABLET | Freq: Three times a day (TID) | ORAL | Status: DC | PRN
  Filled 2014-01-09: qty 1

## 2014-01-09 MED ORDER — HEPARIN SODIUM (PORCINE) 5000 UNIT/ML IJ SOLN
5000.0000 [IU] | Freq: Three times a day (TID) | INTRAMUSCULAR | Status: DC
  Administered 2014-01-10 (×2): 5000 [IU] via SUBCUTANEOUS
  Filled 2014-01-09 (×4): qty 1

## 2014-01-09 MED ORDER — NITROGLYCERIN 1 MG/10 ML FOR IR/CATH LAB
INTRA_ARTERIAL | Status: AC
  Filled 2014-01-09: qty 10

## 2014-01-09 MED ORDER — SENNA 8.6 MG PO TABS
1.0000 | ORAL_TABLET | Freq: Two times a day (BID) | ORAL | Status: DC
  Filled 2014-01-09: qty 1

## 2014-01-09 MED ORDER — LACTATED RINGERS IV SOLN
INTRAVENOUS | Status: DC
  Administered 2014-01-09: 10:00:00 via INTRAVENOUS

## 2014-01-09 MED ORDER — ONDANSETRON HCL 4 MG PO TABS: 4.0000 mg | ORAL_TABLET | ORAL | Status: DC | PRN

## 2014-01-09 MED ORDER — OXYCODONE HCL 5 MG/5ML PO SOLN
5.0000 mg | Freq: Once | ORAL | Status: DC | PRN
Start: 2014-01-09 — End: 2014-01-09

## 2014-01-09 MED ORDER — ONDANSETRON HCL 4 MG PO TABS
4.0000 mg | ORAL_TABLET | ORAL | Status: DC | PRN
  Filled 2014-01-09: qty 1

## 2014-01-09 MED ORDER — PROMETHAZINE HCL 12.5 MG PO TABS
12.5000 mg | ORAL_TABLET | ORAL | Status: DC | PRN
  Filled 2014-01-09: qty 2

## 2014-01-09 MED ORDER — ESMOLOL HCL 10 MG/ML IV SOLN
INTRAVENOUS | Status: DC | PRN
  Administered 2014-01-09: 20 mg via INTRAVENOUS
  Administered 2014-01-09: 30 mg via INTRAVENOUS

## 2014-01-09 MED ORDER — FENTANYL CITRATE 0.05 MG/ML IJ SOLN
INTRAMUSCULAR | Status: DC | PRN
  Administered 2014-01-09 (×2): 100 ug via INTRAVENOUS

## 2014-01-09 MED ORDER — SENNA 8.6 MG PO TABS: 1.0000 | ORAL_TABLET | Freq: Two times a day (BID) | ORAL | Status: DC

## 2014-01-09 MED ORDER — PROPOFOL 10 MG/ML IV BOLUS
INTRAVENOUS | Status: DC | PRN
  Administered 2014-01-09: 80 mg via INTRAVENOUS

## 2014-01-09 MED ORDER — NEOSTIGMINE METHYLSULFATE 10 MG/10ML IV SOLN
INTRAVENOUS | Status: DC | PRN
  Administered 2014-01-09: 4 mg via INTRAVENOUS

## 2014-01-09 MED ORDER — DIPHENHYDRAMINE HCL 50 MG/ML IJ SOLN
10.0000 mg | Freq: Once | INTRAMUSCULAR | Status: AC
  Administered 2014-01-09: 10 mg via INTRAVENOUS

## 2014-01-09 MED ORDER — LISINOPRIL 20 MG PO TABS
20.0000 mg | ORAL_TABLET | Freq: Every day | ORAL | Status: DC
  Filled 2014-01-09: qty 1

## 2014-01-09 NOTE — Transfer of Care (Signed)
Immediate Anesthesia Transfer of Care Note  Patient: Briana Ochoa  Procedure(s) Performed: Procedure(s): Embolization (N/A)  Patient Location: PACU  Anesthesia Type:General  Level of Consciousness: awake, alert , oriented and patient cooperative  Airway & Oxygen Therapy: Patient Spontanous Breathing and Patient connected to nasal cannula oxygen  Post-op Assessment: Report given to PACU RN and Post -op Vital signs reviewed and stable  Post vital signs: Reviewed and stable  Complications: No apparent anesthesia complications

## 2014-01-09 NOTE — Progress Notes (Signed)
Patient notified RN that she is experiencing vision field changes of the R eye.  She has a new onset since 1900 of intermittent/recurring loss of the medial half of her field of vision in the R eye with a color change to "purple."  Patient will be reassessed at 15 minutes and 30 minutes.  She denies headache, dizziness, blurred vision, or any changes in the vision of her L eye.  Remaining neuro exam is normal.  Neurosurgery will be notified if symptoms persist.

## 2014-01-09 NOTE — Anesthesia Preprocedure Evaluation (Addendum)
Anesthesia Evaluation  Patient identified by MRN, date of birth, ID band Patient awake    Reviewed: Allergy & Precautions, H&P , NPO status , Patient's Chart, lab work & pertinent test results  History of Anesthesia Complications (+) PROLONGED EMERGENCENegative for: history of anesthetic complications  Airway Mallampati: I  TM Distance: >3 FB Neck ROM: Full    Dental  (+) Missing, Edentulous Upper, Dental Advisory Given, Poor Dentition   Pulmonary former smoker (quit '05),  breath sounds clear to auscultation        Cardiovascular hypertension, Pt. on medications - angina+ Peripheral Vascular Disease Rhythm:Regular Rate:Normal     Neuro/Psych Unruptured cerebral aneurysm: discovered with vertigo work up    GI/Hepatic negative GI ROS, Neg liver ROS,   Endo/Other  diabetes (glu 148)Morbid obesity  Renal/GU Renal InsufficiencyRenal disease (creat 1.18)     Musculoskeletal   Abdominal (+) + obese,   Peds  Hematology   Anesthesia Other Findings Breast cancer  Reproductive/Obstetrics                           Anesthesia Physical Anesthesia Plan  ASA: III  Anesthesia Plan: General   Post-op Pain Management:    Induction: Intravenous  Airway Management Planned: Oral ETT  Additional Equipment: Arterial line  Intra-op Plan:   Post-operative Plan: Possible Post-op intubation/ventilation  Informed Consent: I have reviewed the patients History and Physical, chart, labs and discussed the procedure including the risks, benefits and alternatives for the proposed anesthesia with the patient or authorized representative who has indicated his/her understanding and acceptance.   Dental advisory given  Plan Discussed with: CRNA and Surgeon  Anesthesia Plan Comments: (Plan routine monitors, A line, GETA, with possible post op ventilation)        Anesthesia Quick Evaluation

## 2014-01-09 NOTE — Brief Op Note (Signed)
PREOP DX: Right ICA aneurysm  POSTOP DX: Same  PROCEDURE: Diagnostic cerebral angiogram  SURGEON: Dr. Consuella Lose, MD  ANESTHESIA: GETA  EBL: Minimal  SPECIMENS: None  COMPLICATIONS: None  CONDITION: Stable to recovery  FINDINGS: 1. Successful Pipeline embolization of RICA aneurysm. Small area of suboptimal wall apposition just proximal to the anterior cavernous genu does not cause any flow limitation. No immediate local thrombotic or distal embolic complications were seen.

## 2014-01-09 NOTE — Progress Notes (Signed)
Pt seen and examined in ICU. No current c/o.  EXAM:  BP 136/66 mmHg  Pulse 64  Temp(Src) 98.3 F (36.8 C)  Resp 20  SpO2 95%  Awake, alert, oriented  Speech fluent, appropriate  CN grossly intact  5/5 BUE/BLE  Groin soft  IMPRESSION:  73 y.o. female s/p Pipeline embolization of RICA aneurysm, neurologically at baseline  PLAN: - Flat for another several hours - Can reverse [place] bed to eat - Will mobilize in am, d/c foley, d/c aline

## 2014-01-09 NOTE — Sedation Documentation (Signed)
Right groin C/D/I with no hematoma or bleeding at this time. 

## 2014-01-09 NOTE — Progress Notes (Signed)
Reassessment of vision indicates no persistent change.  All vision remains normal at this time.

## 2014-01-09 NOTE — H&P (Signed)
CC:  Brain aneurysm  HPI: Briana Ochoa is a 73 year old woman initially seen in the outpatient clinic. She was last seen a few months ago after undergoing diagnostic cerebral angiogram which demonstrated a 12 mm right internal carotid artery aneurysm. She has since undergone surgery as well as radiation therapy for a newly diagnosed breast cancer. She has done very well with her breast cancer treatment, and now presents for treatment of her aneurysm. She has been taking Aspirin and Plavix for the last 10d including this am without c/o excessive bruising or bleeding.  PMH: Past Medical History  Diagnosis Date  . Hypertension   . Vertigo   . Cancer     lt breast  . 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 Radiation 10/21/13-11/11/13    Left Breast  . Chronic kidney disease     renal insuffic, Dr. Jaci Carrel- [place]  . Complication of anesthesia 1970's    difficult time waking- felt like she couldn't breathe  . Heart murmur     Echo > 10 yrs. ago, in [place]   . Diabetes mellitus without complication     diet controlled   . Arthritis     L knee, R shoulder  . Arthritis of knee     PSH: Past Surgical History  Procedure Laterality Date  . Cesarean section  1964  . Cholecystectomy    . Splenectomy, total    . Breast biopsy      Left  . Finger surgery      lt thumb  from dog bite  . Breast lumpectomy with needle localization Left 09/19/2013    Procedure: LEFT BREAST LUMPECTOMY WITH DOUBLE WIRE BRACKETED  NEEDLE LOCALIZATION;  Surgeon: [name], MD;  Location: [facility];  Service: General;  Laterality: Left;  . Tonsillectomy    . Multiple tooth extractions      top palate- wears full plate    SH: History  Substance Use Topics  . Smoking status: Former Smoker -- 1.50 packs/day    Types: Cigarettes    Quit date: 03/06/2003  . Smokeless tobacco: Never Used  . Alcohol Use: No    MEDS: Prior to Admission medications    Medication Sig Start Date End Date Taking? Authorizing Provider  ACCU-CHEK SOFTCLIX LANCETS lancets 1 each daily as needed (to stick finger).  07/17/13   Historical Provider, MD  anastrozole (ARIMIDEX) 1 MG tablet Take 1 tablet (1 mg total) by mouth daily. 12/10/13   Rulon Eisenmenger, MD  aspirin 325 MG tablet Take 325 mg by mouth daily.    Historical Provider, MD  clopidogrel (PLAVIX) 75 MG tablet  12/09/13   Historical Provider, MD  glucose blood test strip Use as instructed 07/17/13   Lucille Passy, MD  Hypromellose (ARTIFICIAL TEARS OP) Apply 1 drop to eye daily as needed.    Historical Provider, MD  lisinopril (PRINIVIL,ZESTRIL) 20 MG tablet Take 1 tablet (20 mg total) by mouth daily. 04/16/13   Lucille Passy, MD  meclizine (ANTIVERT) 12.5 MG tablet Take 12.5 mg by mouth 3 (three) times daily as needed for dizziness.    Historical Provider, MD  ondansetron (ZOFRAN-ODT) 4 MG disintegrating tablet Take 4 mg by mouth every 8 (eight) hours as needed for nausea or vomiting.    Historical Provider, MD    ALLERGY: No Known Allergies  ROS: ROS  NEUROLOGIC EXAM: Awake, alert, oriented Memory and concentration grossly intact Speech fluent, appropriate CN  grossly intact Motor exam: Upper Extremities Deltoid Bicep Tricep Grip  Right 5/5 5/5 5/5 5/5  Left 5/5 5/5 5/5 5/5   Lower Extremity IP Quad PF DF EHL  Right 5/5 5/5 5/5 5/5 5/5  Left 5/5 5/5 5/5 5/5 5/5   Sensation grossly intact to LT  [facility]: Diagnostic cerebral angiogram was again reviewed which demonstrate a superiorly projecting right sided periophthalmic internal carotid artery aneurysm measuring approximately 12 mm, with a relatively narrow neck.  IMPRESSION: 73 year old woman with 12 mm right ICA aneurysm amenable for pipeline embolization. She has done well with her surgery and radiation therapy for newly diagnosed breast cancer.  PLAN: - Proceed with Pipeline embolization of RICA aneurysm - Neuro ICU postop  I discussed  in detail the treatment options for the aneurysm, as well as the rationale for treating the aneurysm at all. Given the size and location of her aneurysm, I recommended embolization with the pipeline device. The risks of the procedure were explained in detail including risk of stroke or hemorrhage during or immediately after the procedure. I also reviewed other risks of procedure including contrast reaction, nephropathy, or groin hematoma. In addition, I talked to them about the general risks of anesthesia including heart attack, stroke, or DVT/PE. The patient understood our discussion and is willing to proceed. All the patient's questions were answered.

## 2014-01-09 NOTE — Sedation Documentation (Signed)
Sheath exchanged for a 13fr. Exoseal, no bleeding or hematoma noted. sterilile dressing applied to site RFA.

## 2014-01-09 NOTE — Plan of Care (Signed)
Problem: Phase I Progression Outcomes Goal: Distal pulses equal to baseline Outcome: Progressing Goal: Vascular site scale level 0 - I Vascular Site Scale Level 0: No bruising/bleeding/hematoma Level I (Mild): Bruising/Ecchymosis, minimal bleeding/ooozing, palpable hematoma < 3 cm Level II (Moderate): Bleeding not affecting hemodynamic parameters, pseudoaneurysm, palpable hematoma > 3 cm Level III (Severe) Bleeding which affects hemodynamic parameters or retroperitoneal hemorrhage  Outcome: Progressing Goal: Pain controlled with appropriate interventions Outcome: Completed/Met Date Met:  01/09/14 Goal: Initial discharge plan identified Outcome: Completed/Met Date Met:  01/09/14 Home Goal: Voiding-avoid urinary catheter unless indicated Outcome: Not Progressing Goal: Hemodynamically stable Outcome: Completed/Met Date Met:  01/09/14

## 2014-01-09 NOTE — Anesthesia Postprocedure Evaluation (Signed)
  Anesthesia Post-op Note  Patient: Briana Ochoa  Procedure(s) Performed: Procedure(s): Embolization (N/A)  Patient Location: PACU  Anesthesia Type: General   Level of Consciousness: awake, alert  and oriented  Airway and Oxygen Therapy: Patient Spontanous Breathing  Post-op Pain: mild  Post-op Assessment: Post-op Vital signs reviewed  Post-op Vital Signs: Reviewed  Last Vitals:  Filed Vitals:   01/09/14 1745  BP:   Pulse: 65  Temp:   Resp: 14    Complications: No apparent anesthesia complications

## 2014-01-10 ENCOUNTER — Encounter (HOSPITAL_COMMUNITY): Payer: Self-pay | Admitting: Neurosurgery

## 2014-01-10 MED ORDER — CLOPIDOGREL BISULFATE 75 MG PO TABS
75.0000 mg | ORAL_TABLET | Freq: Every day | ORAL | Status: DC
  Administered 2014-01-10: 75 mg via ORAL
  Filled 2014-01-10: qty 1

## 2014-01-10 MED ORDER — ASPIRIN 325 MG PO TABS
325.0000 mg | ORAL_TABLET | Freq: Every day | ORAL | Status: DC
  Administered 2014-01-10: 325 mg via ORAL
  Filled 2014-01-10: qty 1

## 2014-01-10 NOTE — Plan of Care (Signed)
Problem: Phase II Progression Outcomes Goal: Pain controlled with appropriate interventions Outcome: Completed/Met Date Met:  01/10/14

## 2014-01-10 NOTE — Plan of Care (Signed)
Problem: Phase II Progression Outcomes Goal: Complications resolved/controlled Outcome: Completed/Met Date Met:  01/10/14

## 2014-01-10 NOTE — Plan of Care (Signed)
Problem: Phase I Progression Outcomes Goal: Other Phase I Outcomes/Goals Outcome: Completed/Met Date Met:  01/10/14

## 2014-01-10 NOTE — Plan of Care (Signed)
Problem: Phase II Progression Outcomes Goal: Tolerates diet Outcome: Completed/Met Date Met:  01/10/14

## 2014-01-10 NOTE — Plan of Care (Signed)
Problem: Phase II Progression Outcomes Goal: Hemodynamically stable Outcome: Completed/Met Date Met:  01/10/14

## 2014-01-10 NOTE — Discharge Summary (Signed)
  Physician Discharge Summary  Patient ID: Briana Ochoa MRN: [id number] DOB/AGE: September 12, 1940 73 y.o.  Admit date: 01/09/2014 Discharge date: 01/10/2014  Admission Diagnoses: RICA aneurysm  Discharge Diagnoses: Same Active Problems:   Intracranial aneurysm   Discharged Condition: Stable  Hospital Course:  Mrs. Briana Ochoa is a 73 y.o. female admitted after elective Pipeline embolization. Postoperatively she was at baseline. She remained flat for several hours. On POD#1 she was mobilized, was tolerating diet, voiding normally. She did complain of occassional "shadow" in her right eye vision in the bottom left of her visual field but was otherwise normal.  Treatments: Surgery - Pipeline embolization RICA aneurysm  Discharge Exam: Blood pressure 106/72, pulse 77, temperature 97.8 F (36.6 C), temperature source Oral, resp. rate 15, height 5\' 4"  (1.626 m), weight 87.091 kg (192 lb), SpO2 94 %. Awake, alert, oriented Speech fluent, appropriate CN grossly intact 5/5 BUE/BLE Groin soft  Follow-up: Follow-up in my office [facility] [phone]) in 4-6 weeks  Disposition: 01-Home or Self Care     Medication List    TAKE these medications        ACCU-CHEK SOFTCLIX LANCETS lancets  1 each daily as needed (to stick finger).     anastrozole 1 MG tablet  Commonly known as:  ARIMIDEX  Take 1 tablet (1 mg total) by mouth daily.     ARTIFICIAL TEARS OP  Apply 1 drop to eye daily as needed.     aspirin 325 MG tablet  Take 325 mg by mouth daily.     clopidogrel 75 MG tablet  Commonly known as:  PLAVIX     glucose blood test strip  Use as instructed     lisinopril 20 MG tablet  Commonly known as:  PRINIVIL,ZESTRIL  Take 1 tablet (20 mg total) by mouth daily.     meclizine 12.5 MG tablet  Commonly known as:  ANTIVERT  Take 12.5 mg by mouth 3 (three) times daily as needed for dizziness.     ondansetron 4 MG disintegrating tablet  Commonly  known as:  ZOFRAN-ODT  Take 4 mg by mouth every 8 (eight) hours as needed for nausea or vomiting.         SignedConsuella Lose, C 01/10/2014, 10:13 AM

## 2014-01-10 NOTE — Plan of Care (Signed)
Problem: Phase I Progression Outcomes Goal: Voiding-avoid urinary catheter unless indicated Outcome: Completed/Met Date Met:  01/10/14     

## 2014-01-10 NOTE — Plan of Care (Signed)
Problem: Phase I Progression Outcomes Goal: Vascular site scale level 0 - I Vascular Site Scale Level 0: No bruising/bleeding/hematoma Level I (Mild): Bruising/Ecchymosis, minimal bleeding/ooozing, palpable hematoma < 3 cm Level II (Moderate): Bleeding not affecting hemodynamic parameters, pseudoaneurysm, palpable hematoma > 3 cm Level III (Severe) Bleeding which affects hemodynamic parameters or retroperitoneal hemorrhage  Outcome: Completed/Met Date Met:  01/10/14

## 2014-01-10 NOTE — Plan of Care (Signed)
Problem: Phase II Progression Outcomes Goal: Ambulates up to 600 ft. in hall x 1 Outcome: Completed/Met Date Met:  01/10/14

## 2014-01-10 NOTE — Plan of Care (Signed)
Problem: Phase II Progression Outcomes Goal: Vascular site scale level 0 - I Vascular Site Scale Level 0: No bruising/bleeding/hematoma Level I (Mild): Bruising/Ecchymosis, minimal bleeding/ooozing, palpable hematoma < 3 cm Level II (Moderate): Bleeding not affecting hemodynamic parameters, pseudoaneurysm, palpable hematoma > 3 cm Level III (Severe) Bleeding which affects hemodynamic parameters or retroperitoneal hemorrhage  Outcome: Completed/Met Date Met:  01/10/14

## 2014-01-10 NOTE — Plan of Care (Signed)
Problem: Phase I Progression Outcomes Goal: Distal pulses equal to baseline Outcome: Completed/Met Date Met:  01/10/14

## 2014-01-10 NOTE — Plan of Care (Signed)
Problem: Phase II Progression Outcomes Goal: Other Discharge Outcomes/Goals Outcome: Completed/Met Date Met:  01/10/14

## 2014-01-10 NOTE — Plan of Care (Signed)
Problem: Phase II Progression Outcomes Goal: Activity appropriate for discharge plan Outcome: Completed/Met Date Met:  01/10/14

## 2014-01-10 NOTE — Plan of Care (Signed)
Problem: Phase II Progression Outcomes Goal: Discharge plan in place and appropriate Outcome: Completed/Met Date Met:  01/10/14

## 2014-01-10 NOTE — Plan of Care (Signed)
Problem: Phase II Progression Outcomes Goal: Barriers To Progression Addressed/Resolved Outcome: Completed/Met Date Met:  01/10/14

## 2014-01-14 ENCOUNTER — Telehealth: Payer: Self-pay

## 2014-01-14 LAB — HM DIABETES EYE EXAM

## 2014-01-14 NOTE — Telephone Encounter (Signed)
Yes I would like to see her.  We would want to manage her medically- perhaps changes her meds.  Please have them fax me office notes.

## 2014-01-14 NOTE — Telephone Encounter (Signed)
Pt was seen [place] [facility] and was told had plaque that had gotten in eye and flow in vein was blocked and pt was told had small stroke in eye; pt was advised to see PCP. Pt wants to know what Dr Deborra Medina would do about this and when does Dr Deborra Medina want to see pt.pt request cb.

## 2014-01-14 NOTE — Telephone Encounter (Signed)
Spoke to pt and f/u appt scheduled. Pt states she will have records faxed over

## 2014-01-20 ENCOUNTER — Ambulatory Visit (INDEPENDENT_AMBULATORY_CARE_PROVIDER_SITE_OTHER): Payer: Commercial Managed Care - HMO | Admitting: Family Medicine

## 2014-01-20 ENCOUNTER — Encounter: Payer: Self-pay | Admitting: Family Medicine

## 2014-01-20 VITALS — BP 148/70 | HR 89 | Temp 98.4°F | Wt 190.0 lb

## 2014-01-20 DIAGNOSIS — H029 Unspecified disorder of eyelid: Secondary | ICD-10-CM | POA: Insufficient documentation

## 2014-01-20 DIAGNOSIS — H579 Unspecified disorder of eye and adnexa: Secondary | ICD-10-CM | POA: Insufficient documentation

## 2014-01-20 NOTE — Progress Notes (Signed)
Pre visit review using our clinic review tool, if applicable. No additional management support is needed unless otherwise documented below in the visit note. 

## 2014-01-20 NOTE — Assessment & Plan Note (Signed)
New- will request records from her opthamologist. Continue ASA. Reassurance provided about her carotids in that recent imaging was done of that region.  She does not need doppplers as suggested by optho.  I do question if the anerysm itself or her surgery caused an embolism since the opthamalic artery was at the base of this according to recent imaging.  Advised to discuss with neurosurg at her scheduled appt. The patient indicates understanding of these issues and agrees with the plan.

## 2014-01-20 NOTE — Progress Notes (Signed)
Subjective:   Patient ID: Briana Ochoa, female    DOB: 1940-07-18, 73 y.o.   MRN: [id number]  Briana Ochoa is a pleasant 73 y.o. year old female who presents to clinic today with Follow-up  on 01/20/2014  HPI: Here to discuss her recent events. Has had a eventful year, unfortunately.  Diagnosed with breast CA and brain aneurysm at the same time.  Now she is s/p embolization on 01/09/14 and lumpectomy and radiation tx.  Now on Arimidex. Fatigued but otherwise doing well.  Went to see her eye doctor due to new onset visual field deficit in right eye (started right after she woke up from embolization surgery), and was told she had "plaque behind her right eye" and should see me in case it came from her carotids.  No HA.  Otherwise vision is normal.  Lab Results  Component Value Date   LDLCALC 109* 04/16/2013    Current Outpatient Prescriptions on File Prior to Visit  Medication Sig Dispense Refill  . ACCU-CHEK SOFTCLIX LANCETS lancets 1 each daily as needed (to stick finger).     [name] anastrozole (ARIMIDEX) 1 MG tablet Take 1 tablet (1 mg total) by mouth daily. 90 tablet 6  . aspirin 325 MG tablet Take 325 mg by mouth daily.    . clopidogrel (PLAVIX) 75 MG tablet     . glucose blood test strip Use as instructed 100 each 12  . Hypromellose (ARTIFICIAL TEARS OP) Apply 1 drop to eye daily as needed.    [name] lisinopril (PRINIVIL,ZESTRIL) 20 MG tablet Take 1 tablet (20 mg total) by mouth daily. 90 tablet 3  . meclizine (ANTIVERT) 12.5 MG tablet Take 12.5 mg by mouth 3 (three) times daily as needed for dizziness.    . ondansetron (ZOFRAN-ODT) 4 MG disintegrating tablet Take 4 mg by mouth every 8 (eight) hours as needed for nausea or vomiting.     No current facility-administered medications on file prior to visit.    No Known Allergies  Past Medical History  Diagnosis Date  . Hypertension   . Vertigo   . Cancer     lt breast  . 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 Radiation 10/21/13-11/11/13    Left Breast  . Chronic kidney disease     renal insuffic, Dr. Jaci Carrel- [place]  . Complication of anesthesia 1970's    difficult time waking- felt like she couldn't breathe  . Heart murmur     Echo > 10 yrs. ago, in [place]   . Diabetes mellitus without complication     diet controlled   . Arthritis     L knee, R shoulder  . Arthritis of knee     Past Surgical History  Procedure Laterality Date  . Cesarean section  1964  . Cholecystectomy    . Splenectomy, total    . Breast biopsy      Left  . Finger surgery      lt thumb  from dog bite  . Breast lumpectomy with needle localization Left 09/19/2013    Procedure: LEFT BREAST LUMPECTOMY WITH DOUBLE WIRE BRACKETED  NEEDLE LOCALIZATION;  Surgeon: [name], MD;  Location: [place];  Service: General;  Laterality: Left;  . Tonsillectomy    . Multiple tooth extractions      top palate- wears full plate  . Radiology with anesthesia N/A 01/09/2014    Procedure: Embolization;  Surgeon: Consuella Lose, MD;  Location: [place];  Service: Radiology;  Laterality: N/A;    Family History  Problem Relation Age of Onset  . Cancer Mother     kidney  . Heart attack Paternal Grandfather   . Breast cancer Maternal Aunt 80  . Breast cancer Paternal Aunt 40    History   Social History  . Marital Status: Widowed    Spouse Name: N/A    Number of Children: N/A  . Years of Education: N/A   Occupational History  .      Retired   Social History Main Topics  . Smoking status: Former Smoker -- 1.50 packs/day    Types: Cigarettes    Quit date: 03/06/2003  . Smokeless tobacco: Never Used  . Alcohol Use: No  . Drug Use: No  . Sexual Activity: Not on file   Other Topics Concern  . Not on file   Social History Narrative   Widowed 2013   5 children   17 grandchildren      Does not have a living will-desires CPR, does not want prolonged life support if  futile.   The PMH, PSH, Social History, Family History, Medications, and allergies have been reviewed in [facility], and have been updated if relevant.   Review of Systems  Constitutional: Positive for fatigue. Negative for fever.  HENT: Negative.   Cardiovascular: Negative.   Skin: Negative.   Neurological: Negative for dizziness, tremors, seizures, syncope, facial asymmetry, speech difficulty, weakness, light-headedness, numbness and headaches.  Hematological: Negative.   Psychiatric/Behavioral: Negative.   All other systems reviewed and are negative.      Objective:    BP 148/70 mmHg  Pulse 89  Temp(Src) 98.4 F (36.9 C) (Oral)  Wt 190 lb (86.183 kg)  SpO2 97%  Wt Readings from Last 3 Encounters:  01/20/14 190 lb (86.183 kg)  01/09/14 192 lb (87.091 kg)  01/09/14 192 lb (87.091 kg)    Physical Exam  Constitutional: She is oriented to person, place, and time. She appears well-developed and well-nourished. No distress.  Cardiovascular: Normal rate.   Pulmonary/Chest: Effort normal.  Neurological: She is alert and oriented to person, place, and time. No cranial nerve deficit.  Skin: Skin is warm and dry.  Psychiatric: She has a normal mood and affect. Her behavior is normal. Judgment and thought content normal.  Nursing note and vitals reviewed.         Assessment & Plan:   Eyelid abnormality  Abnormal eye finding No Follow-up on file.

## 2014-02-10 ENCOUNTER — Encounter: Payer: Self-pay | Admitting: Family Medicine

## 2014-03-18 ENCOUNTER — Other Ambulatory Visit: Payer: Self-pay

## 2014-03-18 ENCOUNTER — Ambulatory Visit: Payer: Commercial Managed Care - HMO | Admitting: Hematology and Oncology

## 2014-03-18 NOTE — Assessment & Plan Note (Signed)
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 In order to ensure bone health, patient will need a DEXA scan. She has been asked to continue with calcium and vitamin D supplementation.  Surveillance: Once a year mammograms and periodic physical exams are recommended. Next mammogram will need to be scheduled for June 2015 Return to clinic in 6 months for followup.

## 2014-03-19 ENCOUNTER — Telehealth: Payer: Self-pay | Admitting: Hematology and Oncology

## 2014-03-19 NOTE — Telephone Encounter (Signed)
, °

## 2014-04-10 ENCOUNTER — Telehealth: Payer: Self-pay | Admitting: Hematology and Oncology

## 2014-04-10 ENCOUNTER — Ambulatory Visit (HOSPITAL_BASED_OUTPATIENT_CLINIC_OR_DEPARTMENT_OTHER): Payer: Commercial Managed Care - HMO | Admitting: Hematology and Oncology

## 2014-04-10 VITALS — BP 151/68 | HR 83 | Temp 97.5°F | Resp 18 | Ht 64.0 in | Wt 193.3 lb

## 2014-04-10 DIAGNOSIS — C50112 Malignant neoplasm of central portion of left female breast: Secondary | ICD-10-CM

## 2014-04-10 DIAGNOSIS — Z170 Estrogen receptor positive status [ER+]: Secondary | ICD-10-CM

## 2014-04-10 DIAGNOSIS — D0512 Intraductal carcinoma in situ of left breast: Secondary | ICD-10-CM

## 2014-04-10 NOTE — Progress Notes (Signed)
Patient Care Team: Lucille Passy, MD as PCP - General (Family Medicine) Fanny Skates, MD as Consulting Physician (General Surgery) Thea Silversmith, MD as Consulting Physician (Radiation Oncology)  DIAGNOSIS: Cancer of central portion of left female breast   Staging form: Breast, AJCC 7th Edition     Clinical: Stage 0 (Tis (DCIS), N0, cM0) - Unsigned       Staging comments: Staged at breast conference 7.22.15      Pathologic: Stage 0 (Tis (DCIS)(2), N0, cM0) - Unsigned   SUMMARY OF ONCOLOGIC HISTORY:   Cancer of central portion of left female breast   08/20/2013 Initial Diagnosis Left breast 12:00: DCIS with calcifications, yet 100%, PR 100%; 09/04/2013 second group of calcifications also biopsied proven to be DCIS ER/PR positive   09/19/2013 Surgery Left breast lumpectomy DCIS 2 foci margins negative, 0.2 and 0.3 cm ER 100% PR 100%   10/08/2013 - 11/12/2013 Radiation Therapy Adjuvant radiation therapy   11/12/2013 -  Anti-estrogen oral therapy Anastrozole 1 mg daily plan is for 5 years   01/09/2014 - 01/10/2014 Hospital Admission Pipeline embolization RICA aneurysm in the brain    CHIEF COMPLIANT: Follow-up of breast cancer and anastrozole  INTERVAL HISTORY: Briana Ochoa is a 74 year old lady with above-mentioned history of left breast cancer treated with lumpectomy and radiation is currently on anastrozole and tolerating it very well without any major problems or concerns. She denies any new lumps or nodules in the breasts.  REVIEW OF SYSTEMS:   Constitutional: Denies fevers, chills or abnormal weight loss Eyes: Denies blurriness of vision Ears, nose, mouth, throat, and face: Denies mucositis or sore throat Respiratory: Denies cough, dyspnea or wheezes Cardiovascular: Denies palpitation, chest discomfort or lower extremity swelling Gastrointestinal:  Denies nausea, heartburn or change in bowel habits Skin: Denies abnormal skin rashes Lymphatics: Denies new lymphadenopathy or easy  bruising Neurological:Denies numbness, tingling or new weaknesses Behavioral/Psych: Mood is stable, no new changes  Breast:  denies any pain or lumps or nodules in either breasts All other systems were reviewed with the patient and are negative.  I have reviewed the past medical history, past surgical history, social history and family history with the patient and they are unchanged from previous note.  ALLERGIES:  has No Known Allergies.  MEDICATIONS:  Current Outpatient Prescriptions  Medication Sig Dispense Refill  . anastrozole (ARIMIDEX) 1 MG tablet Take 1 tablet (1 mg total) by mouth daily. 90 tablet 6  . aspirin 325 MG tablet Take 325 mg by mouth daily.    . clopidogrel (PLAVIX) 75 MG tablet     . Hypromellose (ARTIFICIAL TEARS OP) Apply 1 drop to eye daily as needed.    [name] lisinopril (PRINIVIL,ZESTRIL) 20 MG tablet Take 1 tablet (20 mg total) by mouth daily. 90 tablet 3  . meclizine (ANTIVERT) 12.5 MG tablet Take 12.5 mg by mouth 3 (three) times daily as needed for dizziness.    . ondansetron (ZOFRAN-ODT) 4 MG disintegrating tablet Take 4 mg by mouth every 8 (eight) hours as needed for nausea or vomiting.    . Ranitidine HCl (RANITIDINE 75 PO)      No current facility-administered medications for this visit.    PHYSICAL EXAMINATION: ECOG PERFORMANCE STATUS: 0 - Asymptomatic  Filed Vitals:   04/10/14 1446  BP: 151/68  Pulse: 83  Temp: 97.5 F (36.4 C)  Resp: 18   Filed Weights   04/10/14 1446  Weight: 193 lb 4.8 oz (87.68 kg)    GENERAL:alert, no distress and comfortable  SKIN: skin color, texture, turgor are normal, no rashes or significant lesions EYES: normal, Conjunctiva are pink and non-injected, sclera clear OROPHARYNX:no exudate, no erythema and lips, buccal mucosa, and tongue normal  NECK: supple, thyroid normal size, non-tender, without nodularity LYMPH:  no palpable lymphadenopathy in the cervical, axillary or inguinal LUNGS: clear to auscultation and  percussion with normal breathing effort HEART: regular rate & rhythm and no murmurs and no lower extremity edema ABDOMEN:abdomen soft, non-tender and normal bowel sounds Musculoskeletal:no cyanosis of digits and no clubbing  NEURO: alert & oriented x 3 with fluent speech, no focal motor/sensory deficits BREAST: No palpable masses or nodules in either right or left breasts. No palpable axillary supraclavicular or infraclavicular adenopathy no breast tenderness or nipple discharge. (exam performed in the presence of a chaperone)  LABORATORY DATA:  I have reviewed the data as listed   Chemistry      Component Value Date/Time   NA 141 12/31/2013 1129   K 4.7 12/31/2013 1129   CL 103 12/31/2013 1129   CO2 24 12/31/2013 1129   BUN 22 12/31/2013 1129   CREATININE 1.02 01/09/2014 2000      Component Value Date/Time   CALCIUM 9.8 12/31/2013 1129   ALKPHOS 92 09/17/2013 0926   AST 18 09/17/2013 0926   ALT 12 09/17/2013 0926   BILITOT 0.3 09/17/2013 0926       Lab Results  Component Value Date   WBC 9.1 01/09/2014   HGB 14.4 01/09/2014   HCT 43.4 01/09/2014   MCV 90.2 01/09/2014   PLT 304 01/09/2014   NEUTROABS 6.0 09/17/2013   ASSESSMENT & PLAN:  Cancer of central portion of left female breast DCIS left breast ER 100%, P 100% diagnosis 08/20/2013 status post lumpectomy 09/19/2013; 2 foci measuring 0.2 and 0.3 cm, status post adjuvant radiation therapy, currently on Arimidex since October 2015  Arimidex toxicities: 1. Patient denies any hot flashes or myalgias 2. Bone density has been ordered  Breast cancer surveillance: 1. Breast exam 04/10/2014 is normal 2. Mammograms will be done in June 2016  Return to clinic in 6 months for follow-up    No orders of the defined types were placed in this encounter.   The patient has a good understanding of the overall plan. she agrees with it. She will call with any problems that may develop before her next visit here.   Rulon Eisenmenger, MD

## 2014-04-10 NOTE — Telephone Encounter (Signed)
appts made and avs printed for pt °

## 2014-04-10 NOTE — Assessment & Plan Note (Addendum)
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 Patient denies any hot flashes or myalgias 2. Bone density has been ordered  Breast cancer surveillance: 1. Breast exam 04/10/2014 is normal 2. Mammograms will be done in June 2016  Return to clinic in 6 months for follow-up

## 2014-04-16 ENCOUNTER — Other Ambulatory Visit: Payer: Self-pay

## 2014-04-16 DIAGNOSIS — C50112 Malignant neoplasm of central portion of left female breast: Secondary | ICD-10-CM

## 2014-04-16 DIAGNOSIS — E2839 Other primary ovarian failure: Secondary | ICD-10-CM

## 2014-04-23 ENCOUNTER — Ambulatory Visit
Admission: RE | Admit: 2014-04-23 | Discharge: 2014-04-23 | Disposition: A | Discharge status: Home / Self Care | Payer: Commercial Managed Care - HMO | Source: Ambulatory Visit | Attending: Hematology and Oncology | Admitting: Hematology and Oncology

## 2014-04-23 DIAGNOSIS — C50112 Malignant neoplasm of central portion of left female breast: Secondary | ICD-10-CM

## 2014-04-23 DIAGNOSIS — E2839 Other primary ovarian failure: Secondary | ICD-10-CM

## 2014-05-01 ENCOUNTER — Telehealth: Payer: Self-pay

## 2014-05-01 NOTE — Telephone Encounter (Signed)
Bone density rcvd from [facility] dtd [id number].  Reviewed by Dr. Lindi Adie.  Sent to scan.

## 2014-05-02 ENCOUNTER — Other Ambulatory Visit: Payer: Self-pay | Admitting: Family Medicine

## 2014-05-12 ENCOUNTER — Other Ambulatory Visit (HOSPITAL_COMMUNITY): Payer: Self-pay | Admitting: Neurosurgery

## 2014-05-12 DIAGNOSIS — I671 Cerebral aneurysm, nonruptured: Secondary | ICD-10-CM

## 2014-06-23 ENCOUNTER — Encounter: Payer: Self-pay | Admitting: Family Medicine

## 2014-06-23 ENCOUNTER — Ambulatory Visit (INDEPENDENT_AMBULATORY_CARE_PROVIDER_SITE_OTHER): Payer: Commercial Managed Care - HMO | Admitting: Family Medicine

## 2014-06-23 VITALS — BP 126/90 | HR 86 | Temp 97.7°F | Wt 189.2 lb

## 2014-06-23 DIAGNOSIS — J069 Acute upper respiratory infection, unspecified: Secondary | ICD-10-CM | POA: Diagnosis not present

## 2014-06-23 NOTE — Patient Instructions (Signed)
Drink plenty of fluids, take tylenol as needed, use nasal saline for the irritation, and gargle with warm salt water for your throat.  This should gradually improve.  Take care.  Let us know if you have other concerns.

## 2014-06-23 NOTE — Progress Notes (Signed)
Pre visit review using our clinic review tool, if applicable. No additional management support is needed unless otherwise documented below in the visit note.  Sx started about 2 days ago.  Started with scratchy throat, burning the nasal passages, sinuses.  Voice is hoarse, some better at time of OV.  No fevers, no chills.  No vomiting.  No diarrhea.  Mild hacking cough, no sputum.  No ear pain.  Some maxillary pain B.  Throat is still scratchy.    Meds, vitals, and allergies reviewed.   ROS: See HPI.  Otherwise, noncontributory.  GEN: nad, alert and oriented HEENT: mucous membranes moist, tm w/o erythema, nasal exam w/o erythema, clear discharge noted,  OP with cobblestoning NECK: supple w/o LA CV: rrr.   PULM: ctab, no inc wob EXT: no edema SKIN: no acute rash

## 2014-06-25 DIAGNOSIS — J069 Acute upper respiratory infection, unspecified: Secondary | ICD-10-CM | POA: Insufficient documentation

## 2014-06-25 NOTE — Assessment & Plan Note (Signed)
Likely viral, nontoxic, supportive care.  See AVS.  She agrees.

## 2014-07-21 ENCOUNTER — Other Ambulatory Visit: Payer: Self-pay | Admitting: Family Medicine

## 2014-07-21 ENCOUNTER — Other Ambulatory Visit: Payer: Self-pay | Admitting: Radiology

## 2014-07-21 DIAGNOSIS — Z9889 Other specified postprocedural states: Secondary | ICD-10-CM

## 2014-07-21 DIAGNOSIS — Z853 Personal history of malignant neoplasm of breast: Secondary | ICD-10-CM

## 2014-07-22 ENCOUNTER — Ambulatory Visit (HOSPITAL_COMMUNITY)
Admission: RE | Admit: 2014-07-22 | Discharge: 2014-07-22 | Disposition: A | Discharge status: Home / Self Care | Payer: Commercial Managed Care - HMO | Source: Ambulatory Visit | Attending: Neurosurgery | Admitting: Neurosurgery

## 2014-07-22 DIAGNOSIS — C50919 Malignant neoplasm of unspecified site of unspecified female breast: Secondary | ICD-10-CM | POA: Diagnosis not present

## 2014-07-22 DIAGNOSIS — Z7902 Long term (current) use of antithrombotics/antiplatelets: Secondary | ICD-10-CM | POA: Diagnosis not present

## 2014-07-22 DIAGNOSIS — Z7982 Long term (current) use of aspirin: Secondary | ICD-10-CM | POA: Diagnosis not present

## 2014-07-22 DIAGNOSIS — E1122 Type 2 diabetes mellitus with diabetic chronic kidney disease: Secondary | ICD-10-CM | POA: Insufficient documentation

## 2014-07-22 DIAGNOSIS — Z79811 Long term (current) use of aromatase inhibitors: Secondary | ICD-10-CM | POA: Insufficient documentation

## 2014-07-22 DIAGNOSIS — N189 Chronic kidney disease, unspecified: Secondary | ICD-10-CM | POA: Diagnosis not present

## 2014-07-22 DIAGNOSIS — Z87891 Personal history of nicotine dependence: Secondary | ICD-10-CM | POA: Diagnosis not present

## 2014-07-22 DIAGNOSIS — Z79899 Other long term (current) drug therapy: Secondary | ICD-10-CM | POA: Diagnosis not present

## 2014-07-22 DIAGNOSIS — Z923 Personal history of irradiation: Secondary | ICD-10-CM | POA: Insufficient documentation

## 2014-07-22 DIAGNOSIS — I671 Cerebral aneurysm, nonruptured: Secondary | ICD-10-CM | POA: Diagnosis present

## 2014-07-22 DIAGNOSIS — I129 Hypertensive chronic kidney disease with stage 1 through stage 4 chronic kidney disease, or unspecified chronic kidney disease: Secondary | ICD-10-CM | POA: Diagnosis not present

## 2014-07-22 LAB — BASIC METABOLIC PANEL
Anion gap: 8 (ref 5–15)
BUN: 23 mg/dL — ABNORMAL HIGH (ref 6–20)
CO2: 26 mmol/L (ref 22–32)
Calcium: 9.8 mg/dL (ref 8.9–10.3)
Chloride: 108 mmol/L (ref 101–111)
Creatinine, Ser: 1.32 mg/dL — ABNORMAL HIGH (ref 0.44–1.00)
GFR calc Af Amer: 45 mL/min — ABNORMAL LOW (ref >60)
GFR calc non Af Amer: 39 mL/min — ABNORMAL LOW (ref >60)
Glucose, Bld: 138 mg/dL — ABNORMAL HIGH (ref 65–99)
Potassium: 5.5 mmol/L — ABNORMAL HIGH (ref 3.5–5.1)
Sodium: 142 mmol/L (ref 135–145)

## 2014-07-22 LAB — CBC WITH DIFFERENTIAL/PLATELET
Basophils Absolute: 0.1 10*3/uL (ref 0.0–0.1)
Basophils Relative: 1 % (ref 0–1)
Eosinophils Absolute: 0.5 10*3/uL (ref 0.0–0.7)
Eosinophils Relative: 5 % (ref 0–5)
HCT: 45.9 % (ref 36.0–46.0)
Hemoglobin: 15.3 g/dL — ABNORMAL HIGH (ref 12.0–15.0)
Lymphocytes Relative: 18 % (ref 12–46)
Lymphs Abs: 1.8 10*3/uL (ref 0.7–4.0)
MCH: 29.9 pg (ref 26.0–34.0)
MCHC: 33.3 g/dL (ref 30.0–36.0)
MCV: 89.8 fL (ref 78.0–100.0)
Monocytes Absolute: 0.9 10*3/uL (ref 0.1–1.0)
Monocytes Relative: 10 % (ref 3–12)
Neutro Abs: 6.6 10*3/uL (ref 1.7–7.7)
Neutrophils Relative %: 66 % (ref 43–77)
Platelets: 293 10*3/uL (ref 150–400)
RBC: 5.11 MIL/uL (ref 3.87–5.11)
RDW: 14.1 % (ref 11.5–15.5)
WBC: 9.8 10*3/uL (ref 4.0–10.5)

## 2014-07-22 LAB — PROTIME-INR
INR: 1.01 (ref 0.00–1.49)
Prothrombin Time: 13.5 seconds (ref 11.6–15.2)

## 2014-07-22 LAB — APTT: aPTT: 39 seconds — ABNORMAL HIGH (ref 24–37)

## 2014-07-22 MED ORDER — FENTANYL CITRATE (PF) 100 MCG/2ML IJ SOLN
INTRAMUSCULAR | Status: AC
  Filled 2014-07-22: qty 2

## 2014-07-22 MED ORDER — LIDOCAINE HCL 1 % IJ SOLN
INTRAMUSCULAR | Status: AC
  Filled 2014-07-22: qty 20

## 2014-07-22 MED ORDER — MIDAZOLAM HCL 2 MG/2ML IJ SOLN
INTRAMUSCULAR | Status: AC
  Filled 2014-07-22: qty 2

## 2014-07-22 NOTE — H&P (Signed)
CC:  Aneurysm  HPI: Briana Ochoa is a 74 y.o. female who underwent elective Pipeline embolization of a RICA aneurysm about 6 months ago. She presents today for routine f/u angiography. She continues to take ASA and Plavix. She has no complaints.  PMH: Past Medical History  Diagnosis Date  . Hypertension   . Vertigo   . Cancer     lt breast  . 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 Radiation 10/21/13-11/11/13    Left Breast  . Chronic kidney disease     renal insuffic, Dr. Jaci Carrel- [place]  . Complication of anesthesia 1970's    difficult time waking- felt like she couldn't breathe  . Heart murmur     Echo > 10 yrs. ago, in [place]   . Diabetes mellitus without complication     diet controlled   . Arthritis     L knee, R shoulder  . Arthritis of knee     PSH: Past Surgical History  Procedure Laterality Date  . Cesarean section  1964  . Cholecystectomy    . Splenectomy, total    . Breast biopsy      Left  . Finger surgery      lt thumb  from dog bite  . Breast lumpectomy with needle localization Left 09/19/2013    Procedure: LEFT BREAST LUMPECTOMY WITH DOUBLE WIRE BRACKETED  NEEDLE LOCALIZATION;  Surgeon: [name], MD;  Location: [place];  Service: General;  Laterality: Left;  . Tonsillectomy    . Multiple tooth extractions      top palate- wears full plate  . Radiology with anesthesia N/A 01/09/2014    Procedure: Embolization;  Surgeon: Consuella Lose, MD;  Location: [place];  Service: Radiology;  Laterality: N/A;    SH: History  Substance Use Topics  . Smoking status: Former Smoker -- 1.50 packs/day    Types: Cigarettes    Quit date: 03/06/2003  . Smokeless tobacco: Never Used  . Alcohol Use: No    MEDS: Prior to Admission medications   Medication Sig Start Date End Date Taking? Authorizing Provider  anastrozole (ARIMIDEX) 1 MG tablet Take 1 tablet (1 mg total) by mouth daily. 12/10/13  Yes  Nicholas Lose, MD  aspirin 325 MG tablet Take 325 mg by mouth daily.   Yes Historical Provider, MD  clopidogrel (PLAVIX) 75 MG tablet Take 75 mg by mouth daily.  12/09/13  Yes Historical Provider, MD  Hypromellose (ARTIFICIAL TEARS OP) Apply 1 drop to eye daily as needed (dry eyes).    Yes Historical Provider, MD  lisinopril (PRINIVIL,ZESTRIL) 20 MG tablet TAKE 1 TABLET BY MOUTH DAILY. 05/05/14  Yes Lucille Passy, MD  meclizine (ANTIVERT) 12.5 MG tablet Take 12.5 mg by mouth 3 (three) times daily as needed for dizziness.    Historical Provider, MD  ondansetron (ZOFRAN-ODT) 4 MG disintegrating tablet Take 4 mg by mouth every 8 (eight) hours as needed for nausea or vomiting.    Historical Provider, MD    ALLERGY: No Known Allergies  ROS: ROS  NEUROLOGIC EXAM: Awake, alert, oriented Memory and concentration grossly intact Speech fluent, appropriate CN grossly intact Motor exam: Upper Extremities Deltoid Bicep Tricep Grip  Right 5/5 5/5 5/5 5/5  Left 5/5 5/5 5/5 5/5   Lower Extremity IP Quad PF DF EHL  Right 5/5 5/5 5/5 5/5 5/5  Left 5/5 5/5 5/5 5/5 5/5   Sensation grossly intact to LT  IMPRESSION: -  74 y.o. female 53mo s/p Pipeline embolization of RICA aneurysm, for routine f/u angio  PLAN: - Proceed with diagnostic angiogram - Likely home post-procedure

## 2014-07-22 NOTE — Sedation Documentation (Signed)
Procedure not started due to emergency. Pt will return to short stay

## 2014-07-23 ENCOUNTER — Other Ambulatory Visit: Payer: Self-pay | Admitting: Neurosurgery

## 2014-08-04 ENCOUNTER — Other Ambulatory Visit: Payer: Self-pay

## 2014-08-05 ENCOUNTER — Inpatient Hospital Stay (HOSPITAL_COMMUNITY)
Admission: RE | Admit: 2014-08-05 | Discharge: 2014-08-06 | DRG: 314 | Disposition: A | Discharge status: Home / Self Care | Payer: Commercial Managed Care - HMO | Source: Ambulatory Visit | Attending: Internal Medicine | Admitting: Internal Medicine

## 2014-08-05 ENCOUNTER — Other Ambulatory Visit (HOSPITAL_COMMUNITY): Payer: Self-pay | Admitting: Neurosurgery

## 2014-08-05 ENCOUNTER — Ambulatory Visit (HOSPITAL_COMMUNITY)
Admission: RE | Admit: 2014-08-05 | Discharge: 2014-08-05 | Disposition: A | Discharge status: Home / Self Care | Payer: Commercial Managed Care - HMO | Source: Ambulatory Visit | Attending: Neurology | Admitting: Neurology

## 2014-08-05 ENCOUNTER — Inpatient Hospital Stay (HOSPITAL_COMMUNITY): Payer: Commercial Managed Care - HMO

## 2014-08-05 VITALS — BP 141/85 | HR 74 | Temp 98.4°F | Resp 17 | Ht 64.0 in | Wt 190.0 lb

## 2014-08-05 DIAGNOSIS — I129 Hypertensive chronic kidney disease with stage 1 through stage 4 chronic kidney disease, or unspecified chronic kidney disease: Secondary | ICD-10-CM | POA: Diagnosis present

## 2014-08-05 DIAGNOSIS — R42 Dizziness and giddiness: Secondary | ICD-10-CM

## 2014-08-05 DIAGNOSIS — I9789 Other postprocedural complications and disorders of the circulatory system, not elsewhere classified: Principal | ICD-10-CM | POA: Diagnosis present

## 2014-08-05 DIAGNOSIS — N182 Chronic kidney disease, stage 2 (mild): Secondary | ICD-10-CM | POA: Diagnosis present

## 2014-08-05 DIAGNOSIS — I671 Cerebral aneurysm, nonruptured: Secondary | ICD-10-CM | POA: Diagnosis present

## 2014-08-05 DIAGNOSIS — E1165 Type 2 diabetes mellitus with hyperglycemia: Secondary | ICD-10-CM | POA: Diagnosis present

## 2014-08-05 DIAGNOSIS — Y848 Other medical procedures as the cause of abnormal reaction of the patient, or of later complication, without mention of misadventure at the time of the procedure: Secondary | ICD-10-CM | POA: Diagnosis present

## 2014-08-05 DIAGNOSIS — I6330 Cerebral infarction due to thrombosis of unspecified cerebral artery: Secondary | ICD-10-CM | POA: Diagnosis not present

## 2014-08-05 DIAGNOSIS — Z87891 Personal history of nicotine dependence: Secondary | ICD-10-CM

## 2014-08-05 DIAGNOSIS — Z95828 Presence of other vascular implants and grafts: Secondary | ICD-10-CM

## 2014-08-05 DIAGNOSIS — Z79899 Other long term (current) drug therapy: Secondary | ICD-10-CM

## 2014-08-05 DIAGNOSIS — I639 Cerebral infarction, unspecified: Secondary | ICD-10-CM

## 2014-08-05 DIAGNOSIS — Z7902 Long term (current) use of antithrombotics/antiplatelets: Secondary | ICD-10-CM | POA: Diagnosis not present

## 2014-08-05 DIAGNOSIS — Z853 Personal history of malignant neoplasm of breast: Secondary | ICD-10-CM | POA: Diagnosis not present

## 2014-08-05 DIAGNOSIS — R200 Anesthesia of skin: Secondary | ICD-10-CM | POA: Insufficient documentation

## 2014-08-05 DIAGNOSIS — I6340 Cerebral infarction due to embolism of unspecified cerebral artery: Secondary | ICD-10-CM | POA: Diagnosis present

## 2014-08-05 DIAGNOSIS — Z7982 Long term (current) use of aspirin: Secondary | ICD-10-CM

## 2014-08-05 DIAGNOSIS — I6789 Other cerebrovascular disease: Secondary | ICD-10-CM | POA: Diagnosis not present

## 2014-08-05 DIAGNOSIS — R208 Other disturbances of skin sensation: Secondary | ICD-10-CM | POA: Diagnosis not present

## 2014-08-05 DIAGNOSIS — Z8673 Personal history of transient ischemic attack (TIA), and cerebral infarction without residual deficits: Secondary | ICD-10-CM

## 2014-08-05 HISTORY — DX: Personal history of transient ischemic attack (TIA), and cerebral infarction without residual deficits: Z86.73

## 2014-08-05 LAB — GLUCOSE, CAPILLARY
Glucose-Capillary: 105 mg/dL — ABNORMAL HIGH (ref 65–99)
Glucose-Capillary: 98 mg/dL (ref 65–99)

## 2014-08-05 LAB — CBC
HCT: 44.9 % (ref 36.0–46.0)
Hemoglobin: 15.3 g/dL — ABNORMAL HIGH (ref 12.0–15.0)
MCH: 30.1 pg (ref 26.0–34.0)
MCHC: 34.1 g/dL (ref 30.0–36.0)
MCV: 88.4 fL (ref 78.0–100.0)
Platelets: 326 10*3/uL (ref 150–400)
RBC: 5.08 MIL/uL (ref 3.87–5.11)
RDW: 14.1 % (ref 11.5–15.5)
WBC: 7 10*3/uL (ref 4.0–10.5)

## 2014-08-05 LAB — BASIC METABOLIC PANEL
Anion gap: 6 (ref 5–15)
BUN: 19 mg/dL (ref 6–20)
CO2: 25 mmol/L (ref 22–32)
Calcium: 9.2 mg/dL (ref 8.9–10.3)
Chloride: 107 mmol/L (ref 101–111)
Creatinine, Ser: 1.23 mg/dL — ABNORMAL HIGH (ref 0.44–1.00)
GFR calc Af Amer: 49 mL/min — ABNORMAL LOW (ref >60)
GFR calc non Af Amer: 42 mL/min — ABNORMAL LOW (ref >60)
Glucose, Bld: 124 mg/dL — ABNORMAL HIGH (ref 65–99)
Potassium: 4.5 mmol/L (ref 3.5–5.1)
Sodium: 138 mmol/L (ref 135–145)

## 2014-08-05 MED ORDER — SODIUM CHLORIDE 0.9 % IV SOLN: INTRAVENOUS | Status: DC

## 2014-08-05 MED ORDER — ONDANSETRON 4 MG PO TBDP: 4.0000 mg | ORAL_TABLET | Freq: Three times a day (TID) | ORAL | Status: DC | PRN

## 2014-08-05 MED ORDER — SODIUM CHLORIDE 0.9 % IV SOLN
INTRAVENOUS | Status: AC
  Administered 2014-08-05: 18:00:00 via INTRAVENOUS

## 2014-08-05 MED ORDER — FENTANYL CITRATE (PF) 100 MCG/2ML IJ SOLN
INTRAMUSCULAR | Status: AC | PRN
  Administered 2014-08-05: 25 ug via INTRAVENOUS

## 2014-08-05 MED ORDER — LISINOPRIL 20 MG PO TABS
20.0000 mg | ORAL_TABLET | Freq: Every day | ORAL | Status: DC
  Administered 2014-08-06: 20 mg via ORAL
  Filled 2014-08-05: qty 1

## 2014-08-05 MED ORDER — MECLIZINE HCL 12.5 MG PO TABS: 12.5000 mg | ORAL_TABLET | Freq: Three times a day (TID) | ORAL | Status: DC | PRN

## 2014-08-05 MED ORDER — HYDROCODONE-ACETAMINOPHEN 5-325 MG PO TABS: 1.0000 | ORAL_TABLET | ORAL | Status: DC | PRN

## 2014-08-05 MED ORDER — HEPARIN SODIUM (PORCINE) 5000 UNIT/ML IJ SOLN
5000.0000 [IU] | Freq: Three times a day (TID) | INTRAMUSCULAR | Status: DC
  Administered 2014-08-05 – 2014-08-06 (×3): 5000 [IU] via SUBCUTANEOUS
  Filled 2014-08-05 (×3): qty 1

## 2014-08-05 MED ORDER — FENTANYL CITRATE (PF) 100 MCG/2ML IJ SOLN
INTRAMUSCULAR | Status: AC
Start: 2014-08-05 — End: 2014-08-05
  Filled 2014-08-05: qty 2

## 2014-08-05 MED ORDER — ASPIRIN EC 325 MG PO TBEC
325.0000 mg | DELAYED_RELEASE_TABLET | Freq: Every day | ORAL | Status: DC
  Administered 2014-08-05 – 2014-08-06 (×2): 325 mg via ORAL
  Filled 2014-08-05 (×2): qty 1

## 2014-08-05 MED ORDER — SODIUM CHLORIDE 0.9 % IJ SOLN
3.0000 mL | Freq: Two times a day (BID) | INTRAMUSCULAR | Status: DC
  Administered 2014-08-06: 3 mL via INTRAVENOUS

## 2014-08-05 MED ORDER — HEPARIN SODIUM (PORCINE) 1000 UNIT/ML IJ SOLN
INTRAMUSCULAR | Status: AC
  Filled 2014-08-05: qty 1

## 2014-08-05 MED ORDER — ONDANSETRON HCL 4 MG/2ML IJ SOLN: 4.0000 mg | Freq: Four times a day (QID) | INTRAMUSCULAR | Status: DC | PRN

## 2014-08-05 MED ORDER — CLOPIDOGREL BISULFATE 75 MG PO TABS
75.0000 mg | ORAL_TABLET | Freq: Every day | ORAL | Status: DC
  Administered 2014-08-05 – 2014-08-06 (×2): 75 mg via ORAL
  Filled 2014-08-05 (×2): qty 1

## 2014-08-05 MED ORDER — MIDAZOLAM HCL 2 MG/2ML IJ SOLN
INTRAMUSCULAR | Status: AC
  Filled 2014-08-05: qty 2

## 2014-08-05 MED ORDER — ARTIFICIAL TEARS OP OINT
TOPICAL_OINTMENT | Freq: Every day | OPHTHALMIC | Status: DC | PRN
  Filled 2014-08-05: qty 3.5

## 2014-08-05 MED ORDER — ACETAMINOPHEN 325 MG PO TABS
650.0000 mg | ORAL_TABLET | Freq: Every day | ORAL | Status: DC | PRN
  Administered 2014-08-05: 650 mg via ORAL
  Filled 2014-08-05: qty 2

## 2014-08-05 MED ORDER — LIDOCAINE HCL 1 % IJ SOLN
INTRAMUSCULAR | Status: AC
  Filled 2014-08-05: qty 20

## 2014-08-05 MED ORDER — HEPARIN SODIUM (PORCINE) 1000 UNIT/ML IJ SOLN
2000.0000 [IU] | Freq: Once | INTRAMUSCULAR | Status: AC
  Administered 2014-08-05: 2000 [IU] via INTRAVENOUS

## 2014-08-05 MED ORDER — MIDAZOLAM HCL 2 MG/2ML IJ SOLN
INTRAMUSCULAR | Status: AC | PRN
Start: 2014-08-05 — End: 2014-08-05
  Administered 2014-08-05: 0.5 mg via INTRAVENOUS

## 2014-08-05 MED ORDER — IOHEXOL 300 MG/ML  SOLN
150.0000 mL | Freq: Once | INTRAMUSCULAR | Status: AC | PRN
  Administered 2014-08-05: 30 mL via INTRA_ARTERIAL

## 2014-08-05 MED ORDER — SENNOSIDES-DOCUSATE SODIUM 8.6-50 MG PO TABS: 1.0000 | ORAL_TABLET | Freq: Every evening | ORAL | Status: DC | PRN

## 2014-08-05 MED ORDER — ALUM & MAG HYDROXIDE-SIMETH 200-200-20 MG/5ML PO SUSP
30.0000 mL | Freq: Four times a day (QID) | ORAL | Status: DC | PRN
Start: 2014-08-05 — End: 2014-08-06

## 2014-08-05 MED ORDER — ONDANSETRON HCL 4 MG PO TABS: 4.0000 mg | ORAL_TABLET | Freq: Four times a day (QID) | ORAL | Status: DC | PRN

## 2014-08-05 NOTE — Sedation Documentation (Signed)
Patient is resting comfortably. 

## 2014-08-05 NOTE — Consult Note (Signed)
Referring Physician: Nunkumar    Chief Complaint: code stroke  HPI:                                                                                                                                         Briana Ochoa is an 74 y.o. female presenting to hospital for follow up angiogram after stenting of right ICA aneurysm.  Pateitn did well during procedure but post procedure noted bright wavy lines in her visual field and then right facial numbness in forehead and right finger tips.  Code stroke was called and CT obtained showing no acute bleed or infarct. (imaging reviewed) Due to minimal symptoms tPA was not administered.   Date last known well: Date: 08/05/2014 Time last known well: Time: 11:14 tPA Given: No: minimal symptoms    Past Medical History  Diagnosis Date  . Hypertension   . Vertigo   . Cancer     lt breast  . 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 Radiation 10/21/13-11/11/13    Left Breast  . Chronic kidney disease     renal insuffic, Dr. Jaci Carrel- [place]  . Complication of anesthesia 1970's    difficult time waking- felt like she couldn't breathe  . Heart murmur     Echo > 10 yrs. ago, in [place]   . Diabetes mellitus without complication     diet controlled   . Arthritis     L knee, R shoulder  . Arthritis of knee     Past Surgical History  Procedure Laterality Date  . Cesarean section  1964  . Cholecystectomy    . Splenectomy, total    . Breast biopsy      Left  . Finger surgery      lt thumb  from dog bite  . Breast lumpectomy with needle localization Left 09/19/2013    Procedure: LEFT BREAST LUMPECTOMY WITH DOUBLE WIRE BRACKETED  NEEDLE LOCALIZATION;  Surgeon: [name], MD;  Location: [facility];  Service: General;  Laterality: Left;  . Tonsillectomy    . Multiple tooth extractions      top palate- wears full plate  . Radiology with anesthesia N/A 01/09/2014    Procedure: Embolization;   Surgeon: Consuella Lose, MD;  Location: [facility];  Service: Radiology;  Laterality: N/A;    Family History  Problem Relation Age of Onset  . Cancer Mother     kidney  . Heart attack Paternal Grandfather   . Breast cancer Maternal Aunt 80  . Breast cancer Paternal Aunt 69   Social History:  reports that she quit smoking about 11 years ago. Her smoking use included Cigarettes. She smoked 1.50 packs per day. She has never used smokeless tobacco. She reports that she does not drink alcohol or use illicit drugs.  Allergies: No Known Allergies  Medications:  Prior to Admission:  (Not in a hospital admission) Scheduled: . fentaNYL      . heparin      . lidocaine      . midazolam        ROS:                                                                                                                                       History obtained from the patient  General ROS: negative for - chills, fatigue, fever, night sweats, weight gain or weight loss Psychological ROS: negative for - behavioral disorder, hallucinations, memory difficulties, mood swings or suicidal ideation Ophthalmic ROS: negative for - blurry vision, double vision, eye pain or loss of vision ENT ROS: negative for - epistaxis, nasal discharge, oral lesions, sore throat, tinnitus or vertigo Allergy and Immunology ROS: negative for - hives or itchy/watery eyes Hematological and Lymphatic ROS: negative for - bleeding problems, bruising or swollen lymph nodes Endocrine ROS: negative for - galactorrhea, hair pattern changes, polydipsia/polyuria or temperature intolerance Respiratory ROS: negative for - cough, hemoptysis, shortness of breath or wheezing Cardiovascular ROS: negative for - chest pain, dyspnea on exertion, edema or irregular heartbeat Gastrointestinal ROS: negative for - abdominal pain,  diarrhea, hematemesis, nausea/vomiting or stool incontinence Genito-Urinary ROS: negative for - dysuria, hematuria, incontinence or urinary frequency/urgency Musculoskeletal ROS: negative for - joint swelling or muscular weakness Neurological ROS: as noted in HPI Dermatological ROS: negative for rash and skin lesion changes  Neurologic Examination:                                                                                                      Blood pressure 138/79, pulse 75, temperature 97.5 F (36.4 C), temperature source Oral, resp. rate 18, height 5\' 4"  (1.626 m), weight 86.183 kg (190 lb), SpO2 99 %.  HEENT-  Normocephalic, no lesions, without obvious abnormality.  Normal external eye and conjunctiva.  Normal TM's bilaterally.  Normal auditory canals and external ears. Normal external nose, mucus membranes and septum.  Normal pharynx. Cardiovascular- S1, S2 normal, pulses palpable throughout   Lungs- chest clear, no wheezing, rales, normal symmetric air entry Abdomen- normal findings: bowel sounds normal Extremities- no edema Lymph-no adenopathy palpable Musculoskeletal-no joint tenderness, deformity or swelling Skin-warm and dry, no hyperpigmentation, vitiligo, or suspicious lesions  Neurological Examination Mental Status: Alert, oriented, thought content appropriate.  Speech fluent without evidence of aphasia.  Able to follow 3 step commands without difficulty. Cranial Nerves: II: Discs  flat bilaterally; Visual fields grossly normal, pupils equal, round, reactive to light and accommodation III,IV, VI: ptosis not present, extra-ocular motions intact bilaterally V,VII: smile symmetric, facial light touch sensation decreased along right V1 and V2 VIII: hearing normal bilaterally IX,X: uvula rises symmetrically XI: bilateral shoulder shrug XII: midline tongue extension Motor: Right : Upper extremity   5/5    Left:     Upper extremity   5/5  Lower extremity   5/5     Lower  extremity   5/5 Tone and bulk:normal tone throughout; no atrophy noted Sensory: Pinprick and light touch intact throughout, states she has decreased sensation in her fingertips on the right side.  Deep Tendon Reflexes: 2+ and symmetric throughout Plantars: Right: downgoing   Left: downgoing Cerebellar: normal finger-to-nose, normal rapid alternating movements and normal heel-to-shin test Gait: not tested due to recent angiogram.        Lab Results: Basic Metabolic Panel:  Recent Labs Lab 08/05/14 0918  NA 138  K 4.5  CL 107  CO2 25  GLUCOSE 124*  BUN 19  CREATININE 1.23*  CALCIUM 9.2    Liver Function Tests: No results for input(s): AST, ALT, ALKPHOS, BILITOT, PROT, ALBUMIN in the last 168 hours. No results for input(s): LIPASE, AMYLASE in the last 168 hours. No results for input(s): AMMONIA in the last 168 hours.  CBC:  Recent Labs Lab 08/05/14 0918  WBC 7.0  HGB 15.3*  HCT 44.9  MCV 88.4  PLT 326    Cardiac Enzymes: No results for input(s): CKTOTAL, CKMB, CKMBINDEX, TROPONINI in the last 168 hours.  Lipid Panel: No results for input(s): CHOL, TRIG, HDL, CHOLHDL, VLDL, LDLCALC in the last 168 hours.  CBG:  Recent Labs Lab 08/05/14 0824 08/05/14 1147  GLUCAP 105* 82    Microbiology: Results for orders placed or performed during the hospital encounter of 08/20/13  Urine culture     Status: None   Collection Time: 08/20/13  8:18 PM  Result Value Ref Range Status   Specimen Description URINE, CATHETERIZED  Final   Special Requests NONE  Final   Culture  Setup Time   Final    08/21/2013 00:33 Performed at [facility]   Final    50,000 COLONIES/ML Performed at [facility]   Culture   Final    Multiple bacterial morphotypes present, none predominant. Suggest appropriate recollection if clinically indicated. Performed at [facility]   Report Status 08/22/2013 FINAL  Final    Coagulation Studies: No  results for input(s): LABPROT, INR in the last 72 hours.  Imaging: No results found.     Assessment and plan discussed with with attending physician and they are in agreement.    Etta Quill PA-C [facility] [phone]  08/05/2014, 12:01 PM   Assessment: 74 y.o. female with right facial numbness and right hand numbness S/P cerebral angiogram.  CT head personally reviewed and shows no acute infarct or bleed. Symptoms at this time to minimal for tPA. Differential includes small infarct vs possible complex migraine.   Stroke Risk Factors - diabetes mellitus and hypertension  Recommend:  1. MRI  of the brain without contrast--If positive for stroke would continue with stroke workup 2. Prophylactic therapy-Home antiplatelets

## 2014-08-05 NOTE — Progress Notes (Signed)
Client received from radiology post cerebral angiogram and c/o right face numbness new right tongue numbness and 3 fingers on right hand numbness and called code stroke

## 2014-08-05 NOTE — Progress Notes (Signed)
Pt c/o numbness right tongue/ right face numbness. Grips equal strength. Smile symetrical.

## 2014-08-05 NOTE — H&P (View-Only) (Signed)
CC:  Aneurysm  HPI: Briana Ochoa is a 74 y.o. female who underwent elective Pipeline embolization of a RICA aneurysm about 6 months ago. She presents today for routine f/u angiography. She continues to take ASA and Plavix. She has no complaints.  PMH: Past Medical History  Diagnosis Date  . Hypertension   . Vertigo   . Cancer     lt breast  . 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 Radiation 10/21/13-11/11/13    Left Breast  . Chronic kidney disease     renal insuffic, Dr. Jaci Carrel- [place]  . Complication of anesthesia 1970's    difficult time waking- felt like she couldn't breathe  . Heart murmur     Echo > 10 yrs. ago, in [place]   . Diabetes mellitus without complication     diet controlled   . Arthritis     L knee, R shoulder  . Arthritis of knee     PSH: Past Surgical History  Procedure Laterality Date  . Cesarean section  1964  . Cholecystectomy    . Splenectomy, total    . Breast biopsy      Left  . Finger surgery      lt thumb  from dog bite  . Breast lumpectomy with needle localization Left 09/19/2013    Procedure: LEFT BREAST LUMPECTOMY WITH DOUBLE WIRE BRACKETED  NEEDLE LOCALIZATION;  Surgeon: [name], MD;  Location: [facility];  Service: General;  Laterality: Left;  . Tonsillectomy    . Multiple tooth extractions      top palate- wears full plate  . Radiology with anesthesia N/A 01/09/2014    Procedure: Embolization;  Surgeon: Consuella Lose, MD;  Location: [facility];  Service: Radiology;  Laterality: N/A;    SH: History  Substance Use Topics  . Smoking status: Former Smoker -- 1.50 packs/day    Types: Cigarettes    Quit date: 03/06/2003  . Smokeless tobacco: Never Used  . Alcohol Use: No    MEDS: Prior to Admission medications   Medication Sig Start Date End Date Taking? Authorizing Provider  anastrozole (ARIMIDEX) 1 MG tablet Take 1 tablet (1 mg total) by mouth daily. 12/10/13  Yes  Nicholas Lose, MD  aspirin 325 MG tablet Take 325 mg by mouth daily.   Yes Historical Provider, MD  clopidogrel (PLAVIX) 75 MG tablet Take 75 mg by mouth daily.  12/09/13  Yes Historical Provider, MD  Hypromellose (ARTIFICIAL TEARS OP) Apply 1 drop to eye daily as needed (dry eyes).    Yes Historical Provider, MD  lisinopril (PRINIVIL,ZESTRIL) 20 MG tablet TAKE 1 TABLET BY MOUTH DAILY. 05/05/14  Yes Lucille Passy, MD  meclizine (ANTIVERT) 12.5 MG tablet Take 12.5 mg by mouth 3 (three) times daily as needed for dizziness.    Historical Provider, MD  ondansetron (ZOFRAN-ODT) 4 MG disintegrating tablet Take 4 mg by mouth every 8 (eight) hours as needed for nausea or vomiting.    Historical Provider, MD    ALLERGY: No Known Allergies  ROS: ROS  NEUROLOGIC EXAM: Awake, alert, oriented Memory and concentration grossly intact Speech fluent, appropriate CN grossly intact Motor exam: Upper Extremities Deltoid Bicep Tricep Grip  Right 5/5 5/5 5/5 5/5  Left 5/5 5/5 5/5 5/5   Lower Extremity IP Quad PF DF EHL  Right 5/5 5/5 5/5 5/5 5/5  Left 5/5 5/5 5/5 5/5 5/5   Sensation grossly intact to LT  IMPRESSION: -  74 y.o. female 46mo s/p Pipeline embolization of RICA aneurysm, for routine f/u angio  PLAN: - Proceed with diagnostic angiogram - Likely home post-procedure

## 2014-08-05 NOTE — Interval H&P Note (Signed)
History and Physical Interval Note:  08/05/2014 10:20 AM  Briana Ochoa  has presented today for angiogram, with the diagnosis of cerebral aneurysm.  After consideration of risks, benefits and other options, the patient has consented to  [name] cerebral angiogram.  The patient's history has been reviewed, patient examined, no change in status, stable for surgery.  I have reviewed the patient's chart and labs.  Questions were answered to the patient's satisfaction.     Jaislyn Blinn, C

## 2014-08-05 NOTE — Code Documentation (Signed)
75yo female for scheduled catheter cerebral angiogram today by Dr. Kathyrn Sheriff.  Patient reports that she has a h/o aneurysm with stent placement and takes Plavix.  Today at the end of the procedure patient reports that she noticed the right side of her tongue to go numb.  LKW 1112.  This progressed to her right face and right fingertips.  Patient reports seeing bright wavy lights but this resolved.  She also reports continued headache.  Patient transferred to short stay where a Code Stroke was called.  Per short stay RN, Dr. Kathyrn Sheriff was made aware of patient's symptoms while in radiology.  Stroke team to the bedside.  Patient to CT.  NIHSS 1, see documentation for details and code stroke times.  Patient continues to report right facial numbness and headache.  Patient is too mild to treat with tPA at this time, however, she remains in the tPA window until 1542 should symptoms worsen.  Patient will need a stroke swallow screen performed per protocol when she is able to have her HOB elevated (patient with HOB flat per groin site precautions).  Patient to be admitted.  Bedside handoff with Threasa Beards, RN.

## 2014-08-05 NOTE — Progress Notes (Signed)
Report called and transferred via bed to 4N30

## 2014-08-05 NOTE — Sedation Documentation (Signed)
Pt states that she has tingling in the right side of mouth and tongue, also tingling in her right fingers. MD notified, states he will check on her in [place]. Pt has no other complaints, vitals stable. Able to move all extremities freely. Good grip bilaterally, no aphasia, no weakness

## 2014-08-05 NOTE — H&P (Signed)
[facility] History and Physical                                                                                    Briana Ochoa, is a 74 y.o. female  MRN: [id number]   DOB - Dec 27, 1940  Admit Date - 08/05/2014  Outpatient Primary MD for the patient is Arnette Norris, MD  Referring Physician:  Dr. Janann Colonel, Neurology  Chief Complaint:  Right facial numbness    HPI  Briana Ochoa  is a 74 y.o. female, with hypertension, intracranial aneurysm, vertigo, chronic kidney disease, and diabetes. She was undergoing a scheduled CT angiogram in follow-up of a right cerebral stent placement (2015) by Dr. Kathyrn Sheriff when she developed right tongue and facial numbness. The patient states that she started to see bright wavy lines in her vision at about the same time that the last picture during the CT angiogram was being taken.  She developed numbness in her right tongue and right face as well as numbness and tingling in her right fingertips. Afterward the patient developed a headache in her right temple.  The patient reports feeling well yesterday and this morning. She had no complaints of chest pain, headache, difficulty eating or swallowing, abdominal pain, changes in bowel habits, or dysuria. 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  Labs in short stay show a mildly elevated creatinine (appears at baseline for this patient).  CT head shows right MCA appears less dense than left, and right ICA stent is stenotic proximally.  Neurology has been consulted and recommends MR brain. They feel this could be cerebral accident versus atypical migraine. They also recommend continuing Plavix and full dose aspirin.   In addition to the HPI above,  No Fever-chills, No problems swallowing food or Liquids, No Chest pain, Cough or Shortness of Breath, No Abdominal pain, No Nausea or  Vomiting, Bowel movements are regular, No Blood in stool or Urine, No dysuria, No new skin rashes or bruises, No new joints pains-aches,  No new weakness, tingling, numbness in any extremity, No recent weight gain or loss, A full 10 point Review of Systems was done, except as stated above, all other Review of Systems were negative.  Past Medical History  Past Medical History  Diagnosis Date  . Hypertension   . Vertigo   . Cancer     lt breast  . 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 Radiation 10/21/13-11/11/13    Left Breast  . Chronic kidney disease     renal insuffic, Dr. Jaci Carrel- [place]  . Complication of anesthesia 1970's    difficult time waking- felt like she couldn't breathe  . Heart murmur     Echo > 10 yrs. ago, in [place]   . Diabetes mellitus without complication     diet controlled   . Arthritis     L knee, R shoulder  . Arthritis of knee     Past Surgical History  Procedure  Laterality Date  . Cesarean section  1964  . Cholecystectomy    . Splenectomy, total    . Breast biopsy      Left  . Finger surgery      lt thumb  from dog bite  . Breast lumpectomy with needle localization Left 09/19/2013    Procedure: LEFT BREAST LUMPECTOMY WITH DOUBLE WIRE BRACKETED  NEEDLE LOCALIZATION;  Surgeon: [name], MD;  Location: [facility];  Service: General;  Laterality: Left;  . Tonsillectomy    . Multiple tooth extractions      top palate- wears full plate  . Radiology with anesthesia N/A 01/09/2014    Procedure: Embolization;  Surgeon: Consuella Lose, MD;  Location: [facility];  Service: Radiology;  Laterality: N/A;      Social History History  Substance Use Topics  . Smoking status: Former Smoker -- 1.50 packs/day    Types: Cigarettes    Quit date: 03/06/2003  . Smokeless tobacco: Never Used  . Alcohol Use: No   she has a 40-pack-year smoking history. She lives at home independently and although she  does have a renter in the house, this person is not family or available to care for the patient. She is independent with her ADLs.  Family History Family History  Problem Relation Age of Onset  . Cancer Mother     kidney  . Heart attack Paternal Grandfather   . Breast cancer Maternal Aunt 80  . Breast cancer Paternal Aunt 80    Prior to Admission medications   Medication Sig Start Date End Date Taking? Authorizing Provider  anastrozole (ARIMIDEX) 1 MG tablet Take 1 tablet (1 mg total) by mouth daily. 12/10/13  Yes Nicholas Lose, MD  Hypromellose (ARTIFICIAL TEARS OP) Apply 1 drop to eye daily as needed (dry eyes).    Yes Historical Provider, MD  ibuprofen (ADVIL,MOTRIN) 200 MG tablet Take 400 mg by mouth daily as needed (pain).   Yes Historical Provider, MD  lisinopril (PRINIVIL,ZESTRIL) 20 MG tablet TAKE 1 TABLET BY MOUTH DAILY. 05/05/14  Yes Lucille Passy, MD  acetaminophen (TYLENOL) 325 MG tablet Take 650 mg by mouth daily as needed (pain).    Historical Provider, MD  aspirin 325 MG tablet Take 325 mg by mouth daily.    Historical Provider, MD  clopidogrel (PLAVIX) 75 MG tablet Take 75 mg by mouth daily.  12/09/13   Historical Provider, MD  meclizine (ANTIVERT) 12.5 MG tablet Take 12.5 mg by mouth 3 (three) times daily as needed for dizziness.    Historical Provider, MD  ondansetron (ZOFRAN-ODT) 4 MG disintegrating tablet Take 4 mg by mouth every 8 (eight) hours as needed for nausea or vomiting.    Historical Provider, MD    No Known Allergies  Physical Exam  Vitals  Blood pressure 138/79, pulse 75, temperature 97.5 F (36.4 C), temperature source Oral, resp. rate 18, height 5\' 4"  (1.626 m), weight 86.183 kg (190 lb), SpO2 99 %.   General:  Pleasant, well developed female, lying in bed in NAD, friend and friend's husband at bedside.  Psych:  Normal affect and insight, Not Suicidal or Homicidal, Awake Alert, Oriented X 3. Completely coherent.  Neuro:   Decreased right facial  sensation in comparison to left.  Decreased sensation in tips of right fingers.  Strength symmetric, no tongue deviation.  No other focal neuro deficits.   ENT:  Ears and Eyes appear Normal, Conjunctivae clear, PER. Moist oral mucosa without erythema or exudates.  Neck:  Supple, No lymphadenopathy appreciated  Respiratory:  Symmetrical chest wall movement, Good air movement bilaterally, CTAB.  Cardiac:  RRR, No Murmurs, no LE edema noted, no JVD.    Abdomen:  Positive bowel sounds, Soft, Non tender, Non distended,  No masses appreciated  Skin:  No Cyanosis, Normal Skin Turgor, No Skin Rash or Bruise.  Extremities:  Able to move all 4. 5/5 strength in each,  no effusions.  Data Review  CBC  Recent Labs Lab 08/05/14 0918  WBC 7.0  HGB 15.3*  HCT 44.9  PLT 326  MCV 88.4  MCH 30.1  MCHC 34.1  RDW 14.1    Chemistries   Recent Labs Lab 08/05/14 0918  NA 138  K 4.5  CL 107  CO2 25  GLUCOSE 124*  BUN 19  CREATININE 1.23*  CALCIUM 9.2     Imaging results:   Ct Head Wo Contrast  08/05/2014   CLINICAL DATA:  Code stroke. Cerebral angio procedure performed earlier this month. Right-sided weakness and numbness in the face and tongue.  EXAM: CT HEAD WITHOUT CONTRAST  TECHNIQUE: Contiguous axial images were obtained from the base of the skull through the vertex without intravenous contrast.  COMPARISON:  07/22/2014  FINDINGS: Right cavernous ICA aneurysm with calcified margin observed.  There is residual intravascular contrast. There is a stent in the right internal carotid artery. On image 24 series 3 there appears to be focal narrowing of this stent.  Suspected calcified meningioma along the left posterior fossa anteriorly. Another small meningiomas present along the left sphenoid wing medially.  No acute CVA is identified. No mass lesion or acute intracranial hemorrhage observed.  The left MCA appears somewhat more dense than the right, but this may well be from slice  selection rather than thrombus in the left MCA. I discussed this with Dr. some nerve, and he indicated that the patient's current clinical profile is not typical for a large MCA stroke.  Slight hypodensity along the inferior aspect the left lentiform nucleus, probably a dilated perivascular space rather than lacunar infarct.  Periventricular white matter and corona radiata hypodensities favor chronic ischemic microvascular white matter disease.  Mild chronic ethmoid sinusitis.  IMPRESSION: 1. The right MCA appears less dense than the left, with residual intravascular contrast present. The possibility of Vasa spasm lower reduced flow to the right CEA MCA is a possibility, but this could be equally well simply be due to slice selection. No secondary signs of acute stroke. No intracranial hemorrhage identified. 2. Right ICA stent is stenotic proximally, images 24-25 series 3. 3. Probably clinically inconsequential meningiomas on the left. 4. Periventricular white matter and corona radiata hypodensities favor chronic ischemic microvascular white matter disease. 5. Mild chronic ethmoid sinusitis.  These results were called by telephone at the time of interpretation on 08/05/2014 at 12:25 pm to Dr. Jim Like , who verbally acknowledged these results.   Electronically Signed   By: Van Clines M.D.   On: 08/05/2014 12:26   EKG is pending.   Assessment & Plan  Principal Problem:   Rt facial numbness Active Problems:   Vertigo   Right internal carotid artery aneurysm   Right facial numbness  Right facial numbness Associated with HA, right fingertip numbness that began at the end of angiogram procedure.  Management per neurology and neurosurgery.   Per Neuro no TPA due to patient having only mild symptoms CT head is inconclusive.  MR Brain pending.   If the MR is positive for stroke  the full stroke work up should be ordered. Patient will be on full dose Aspirin and Plavix per Dr. Kathyrn Sheriff.  Right  Internal Carotid Artery Aneurysm with stent CT Angio performed 6/28.  "Severe stenosis with flow limitation of the proximal RICA Pipeline stent likely representing hyper endothelialization". Management per Dr. Kathyrn Sheriff.  Vertigo Continue Meclizine. PT evaluation ordered for 6/29.  Hypertension Continue Lisinopril  History of Breast Cancer Holding Arimidex.  CKD stage II - III Creatinine at baseline.  Will recheck bmet in am as patient has received contrast 6/28   Consultants Called:  Neurology, Dr. Janann Colonel has seen the patient  Family Communication:   No family at bedside but friend from church "Fraser Din" is at bedside and advocates for the patient  Code Status:  Full code.   Condition:  guarded  Potential Disposition: Likely to home on 6/29 if work up is negative.  Time spent in minutes : [address]  PA-C on 08/05/2014 at 1:36 PM Between 7am to 7pm - Pager - [phone] After 7pm go to [email or web address] - password [email or web address] And look for the night coverage person covering me after hours  [facility]

## 2014-08-05 NOTE — Op Note (Signed)
DIAGNOSTIC CEREBRAL ANGIOGRAM    OPERATOR:   Dr. Consuella Lose, MD  HISTORY:   The patient is a 74 y.o. yo female who underwent treatment of a RICA aneurysm with the Pipeline device 6 months ago. She come in today for routine f/u angiogram.  APPROACH:   The technical aspects of the procedure as well as its potential risks and benefits were reviewed with the patient. These risks included but were not limited bleeding, infection, allergic reaction, damage to organs/vital structures, stroke, non-diagnostic procedure, and the catastrophic outcomes of heart attack, coma, and death. With an understanding of these risks, informed consent was obtained and witnessed.    The patient was placed in the supine position on the angiography table and the skin of right groin prepped in the usual sterile fashion. The procedure was performed under local anesthesia (1%-solution of bicarbonate-bufferred Lidoacaine) and conscious sedation with Versed and fentanyl monitored by the in-suite nurse.    A 5- French sheath was introduced in the right common femoral artery using Seldinger technique.  A fluorophase sequence was used to document the sheath position.    HEPARIN: 2000 Units total.   CONTRAST AGENT: 50cc, Omnipaque 300   FLUOROSCOPY TIME: 10.0 combined AP and lateral minutes    CATHETER(S) AND WIRE(S):    5-French JB-1 glidecatheter   5-French Neta Ehlers Catheter 5-French Simmons-2 Catheter 0.035" glidewire    VESSELS CATHETERIZED:   Right common carotid   Left common carotid   Left vertebral   Right common femoral  VESSELS STUDIED:   Right common carotid, neck Right common carotid, head Left common carotid, head Left vertebral Right femoral  PROCEDURAL NARRATIVE:   A 5-Fr JB-1 terumo glide catheter was advanced over a 0.035 glidewire into the aortic arch. The left vert was catheterized. Attempts were made to catheterize the right common unsuccessfully. Similar attempts were made with the  [facility] catheter. The Simmons-2 was then used to catheterize the right common and left common. After review of images, the catheter was removed without incident.    INTERPRETATION:   Right common carotid: neck:   There is no significant stenosis, occlusion, aneurysm or plaque visualized on this injection.    Right common carotid: head:   The Pipeline stent is visualized in the cavernous to supraclinoid ICA. There is severe stenosis of the proximal stent, and moderate stenosis of the distal stent which appears to be somewhat flow limiting. There is minimal aneurysm filling at the neck. The distal ICA and ACA/MCA territory is better visualized on the left carotid injection.  Left common carotid: head:   Injection reveals the presence of a widely patent ICA, A1, and M1 segments and their branches. There is no significant stenosis, occlusion, aneurysm, or high flow vascular malformation visualized. The Acom is patent, and there is good filling of the right MCA and ACA territories.  The parenchymal and venous phases are normal. The venous sinuses are widely patent.    Left vertebral:   Injection reveals the presence of a widely patent vertebral artery. This leads to a widely patent basilar artery that terminates in bilateral P1. The basilar apex is normal. There is no significant stenosis, occlusion, aneurysm, or vascular malformation visualized. The parenchymal and venous phases are normal. The venous sinuses are widely patent.    Right femoral:    Normal vessel. No significant atherosclerotic disease. Arterial sheath in adequate position.   DISPOSITION:  Upon completion of the study, the femoral sheath was removed and hemostasis obtained using a  5-Fr ExoSeal closure device. Good proximal and distal lower extremity pulses were documented upon achievement of hemostasis.    The procedure was well tolerated and no early complications were observed.       The patient was transferred back to the holding  area to be positioned flat in bed for 3 hours of observation.    IMPRESSION:  1. Severe stenosis with flow limitation of the proximal RICA Pipeline stent likely representing hyperendothelialization.  The preliminary results of this procedure were shared with the patient.

## 2014-08-06 ENCOUNTER — Inpatient Hospital Stay (HOSPITAL_COMMUNITY): Payer: Commercial Managed Care - HMO

## 2014-08-06 DIAGNOSIS — I6330 Cerebral infarction due to thrombosis of unspecified cerebral artery: Secondary | ICD-10-CM | POA: Insufficient documentation

## 2014-08-06 DIAGNOSIS — I5189 Other ill-defined heart diseases: Secondary | ICD-10-CM

## 2014-08-06 DIAGNOSIS — R208 Other disturbances of skin sensation: Secondary | ICD-10-CM

## 2014-08-06 DIAGNOSIS — R200 Anesthesia of skin: Secondary | ICD-10-CM

## 2014-08-06 DIAGNOSIS — I6789 Other cerebrovascular disease: Secondary | ICD-10-CM

## 2014-08-06 HISTORY — DX: Other ill-defined heart diseases: I51.89

## 2014-08-06 LAB — BASIC METABOLIC PANEL
Anion gap: 9 (ref 5–15)
BUN: 16 mg/dL (ref 6–20)
CO2: 25 mmol/L (ref 22–32)
Calcium: 8.9 mg/dL (ref 8.9–10.3)
Chloride: 105 mmol/L (ref 101–111)
Creatinine, Ser: 1.28 mg/dL — ABNORMAL HIGH (ref 0.44–1.00)
GFR calc Af Amer: 47 mL/min — ABNORMAL LOW (ref >60)
GFR calc non Af Amer: 40 mL/min — ABNORMAL LOW (ref >60)
Glucose, Bld: 112 mg/dL — ABNORMAL HIGH (ref 65–99)
Potassium: 4.5 mmol/L (ref 3.5–5.1)
Sodium: 139 mmol/L (ref 135–145)

## 2014-08-06 LAB — CBC
HCT: 44.1 % (ref 36.0–46.0)
Hemoglobin: 14.7 g/dL (ref 12.0–15.0)
MCH: 30.2 pg (ref 26.0–34.0)
MCHC: 33.3 g/dL (ref 30.0–36.0)
MCV: 90.7 fL (ref 78.0–100.0)
Platelets: 294 10*3/uL (ref 150–400)
RBC: 4.86 MIL/uL (ref 3.87–5.11)
RDW: 14.3 % (ref 11.5–15.5)
WBC: 8.6 10*3/uL (ref 4.0–10.5)

## 2014-08-06 LAB — LIPID PANEL
Cholesterol: 173 mg/dL (ref 0–200)
HDL: 48 mg/dL (ref >40)
LDL Cholesterol: 89 mg/dL (ref 0–99)
Total CHOL/HDL Ratio: 3.6 RATIO
Triglycerides: 180 mg/dL — ABNORMAL HIGH (ref <150)
VLDL: 36 mg/dL (ref 0–40)

## 2014-08-06 LAB — HEMOGLOBIN A1C
Hgb A1c MFr Bld: 6.3 % — ABNORMAL HIGH (ref 4.8–5.6)
Mean Plasma Glucose: 134 mg/dL

## 2014-08-06 MED ORDER — ATORVASTATIN CALCIUM 10 MG PO TABS: 10.0000 mg | ORAL_TABLET | Freq: Every day | ORAL | Status: DC

## 2014-08-06 MED ORDER — ATORVASTATIN CALCIUM 20 MG PO TABS: 20.0000 mg | ORAL_TABLET | Freq: Every day | ORAL | Status: DC

## 2014-08-06 NOTE — Evaluation (Signed)
Physical Therapy Evaluation Patient Details Name: Briana Ochoa MRN: [id number] DOB: 1940/05/09 Today's Date: 08/06/2014   History of Present Illness  Briana Ochoa is a 74 y.o. female with history of hypertension, intracranial aneurysm, chronic kidney disease, diabetes who initially presented for a scheduled CT angiogram by Dr Briana Ochoa as a follow-up for right cerebral stent placement in 2015. During the procedure, patient developed right tongue and facial numbness, trouble with her vision as bright wavy lines and subsequently right temporal headache. Code stroke was called.  Clinical Impression  Pt admitted with above diagnosis. Pt currently with functional limitations due to the deficits listed below (see PT Problem List). Ambulates generally well with only mild difficulty during higher level balance tasks. Safely completed stair training. Agreeable to cane use at home and follow up with outpatient physical therapy. Did not require physical assist for any tasks. Has a roommate and friends/neighbors that will check on patient routinely, as needed. Will follow and monitor for changes in function until discharge.     Follow Up Recommendations Outpatient PT;Supervision - Intermittent (preferrably outpatient neuro rehab)    Equipment Recommendations  None recommended by PT    Recommendations for Other Services       Precautions / Restrictions Precautions Precautions: None Restrictions Weight Bearing Restrictions: No      Mobility  Bed Mobility Overal bed mobility: Modified Independent             General bed mobility comments: Used rail to rise  Transfers Overall transfer level: Modified independent               General transfer comment: No instability noted  Ambulation/Gait Ambulation/Gait assistance: Supervision Ambulation Distance (Feet): 525 Feet Assistive device: None Gait Pattern/deviations: Step-through pattern;Staggering right;Drifts  right/left     General Gait Details: Supervision for safety. Slight rightward drift noted. VC for awareness. Stable with most dynamic tasks although slight stagger with quick turns  and vertical head turns. Was able to self correct without physical assistance. Backwards stepping, variable speeds, and high marching performed well.  Stairs Stairs: Yes   Stair Management: One rail Right Number of Stairs: 2 General stair comments: VC for sequencing. Performed without loss of balance. Verbailzes that she feels confident with this task.  Wheelchair Mobility    Modified Rankin (Stroke Patients Only) Modified Rankin (Stroke Patients Only) Pre-Morbid Rankin Score: No symptoms Modified Rankin: Moderately severe disability     Balance Overall balance assessment: Needs assistance Sitting-balance support: Feet supported;No upper extremity supported Sitting balance-Leahy Scale: Normal     Standing balance support: No upper extremity supported Standing balance-Leahy Scale: Good                               Pertinent Vitals/Pain Pain Assessment: No/denies pain    Home Living Family/patient expects to be discharged to:: Private residence Living Arrangements: Alone Available Help at Discharge: Neighbor;Friend(s);Available PRN/intermittently Type of Home: House Home Access: Stairs to enter Entrance Stairs-Rails: Right Entrance Stairs-Number of Steps: 2 Home Layout: One level Home Equipment: Cane - single point      Prior Function Level of Independence: Independent               Hand Dominance   Dominant Hand: Right    Extremity/Trunk Assessment   Upper Extremity Assessment: Defer to OT evaluation           Lower Extremity Assessment: Overall WFL for tasks assessed (Reports 3wk  history of Rt heel pain. Hx of Lt knee pain)         Communication   Communication: No difficulties  Cognition Arousal/Alertness: Awake/alert Behavior During Therapy: WFL  for tasks assessed/performed Overall Cognitive Status: Within Functional Limits for tasks assessed                      General Comments General comments (skin integrity, edema, etc.): Encouraged to use cane at home temporarily. Agrees she would benefit from outpatient physical therapy to improve gait and balance. Mild dyspnea on exertion with SpO2 of 96% on room air. HR in 80s.    Exercises        Assessment/Plan    PT Assessment Patient needs continued PT services  PT Diagnosis Abnormality of gait   PT Problem List Decreased balance;Decreased mobility;Impaired sensation  PT Treatment Interventions Gait training;DME instruction;Functional mobility training;Therapeutic activities;Balance training;Therapeutic exercise;Neuromuscular re-education;Patient/family education   PT Goals (Current goals can be found in the Care Plan section) Acute Rehab PT Goals Patient Stated Goal: Go home soon PT Goal Formulation: With patient Time For Goal Achievement: 08/20/14 Potential to Achieve Goals: Good    Frequency Min 4X/week   Barriers to discharge Decreased caregiver support Has PRN support from friends and neighbors including a roommate. Neighbor can drive patient to OP PT     Co-evaluation               End of Session Equipment Utilized During Treatment: Gait belt Activity Tolerance: Patient tolerated treatment well Patient left: in chair;with call bell/phone within reach Nurse Communication: Mobility status         Time: 9767-3419 PT Time Calculation (min) (ACUTE ONLY): 23 min   Charges:   PT Evaluation $Initial PT Evaluation Tier I: 1 Procedure PT Treatments $Gait Training: 8-22 mins   PT G CodesEllouise Ochoa 08/06/2014, 10:37 AM Briana Ochoa, Briana Ochoa

## 2014-08-06 NOTE — Evaluation (Signed)
Occupational Therapy Evaluation/ discharge Patient Details Name: Briana Ochoa MRN: [id number] DOB: Dec 13, 1940 Today's Date: 08/06/2014    History of Present Illness Briana Ochoa is a 74 y.o. female with history of hypertension, intracranial aneurysm, chronic kidney disease, diabetes who initially presented for a scheduled CT angiogram by Dr Kathyrn Sheriff as a follow-up for right cerebral stent placement in 2015. During the procedure, patient developed right tongue and facial numbness, trouble with her vision as bright wavy lines and subsequently right temporal headache. Code stroke was called.   Clinical Impression   Patient evaluated by Occupational Therapy with no further acute OT needs identified. All education has been completed and the patient has no further questions. See below for any follow-up Occupational Therapy or equipment needs. OT to sign off. Thank you for referral.       Follow Up Recommendations  No OT follow up    Equipment Recommendations  None recommended by OT    Recommendations for Other Services       Precautions / Restrictions Precautions Precautions: None Restrictions Weight Bearing Restrictions: No      Mobility Bed Mobility               General bed mobility comments: in chair on arrival  Transfers Overall transfer level: Modified independent                    Balance                                            ADL Overall ADL's : At baseline                                             Vision Vision Assessment?: Yes Eye Alignment: Within Functional Limits Ocular Range of Motion: Within Functional Limits Tracking/Visual Pursuits: Able to track stimulus in all quads without difficulty Saccades: Within functional limits Convergence: Within functional limits Visual Fields: Other (comment) (baseline R eye L upper quadrant deficit) Additional Comments: pt reports no abnormal  changes noted. Pt able to draw a clock, write name and copy image. Pt without glasses so blurry vision difficult to fully assess due to lack of glasses   Perception     Praxis      Pertinent Vitals/Pain Pain Assessment: No/denies pain     Hand Dominance Right   Extremity/Trunk Assessment Upper Extremity Assessment Upper Extremity Assessment: RUE deficits/detail RUE Deficits / Details: tips of each digit is numb but only tips. normal sensation after PIP joint   Lower Extremity Assessment Lower Extremity Assessment: Defer to PT evaluation   Cervical / Trunk Assessment Cervical / Trunk Assessment: Normal   Communication Communication Communication: No difficulties   Cognition Arousal/Alertness: Awake/alert Behavior During Therapy: WFL for tasks assessed/performed Overall Cognitive Status: Within Functional Limits for tasks assessed                     General Comments       Exercises       Shoulder Instructions      Home Living Family/patient expects to be discharged to:: Private residence Living Arrangements: Alone Available Help at Discharge: Neighbor;Friend(s);Available PRN/intermittently Type of Home: House Home Access: Stairs to enter [employer] of Steps: 2 Entrance  Stairs-Rails: Right Home Layout: One level               Home Equipment: Cane - single point          Prior Functioning/Environment Level of Independence: Independent             OT Diagnosis:     OT Problem List:     OT Treatment/Interventions:      OT Goals(Current goals can be found in the care plan section)    OT Frequency:     Barriers to D/C:            Co-evaluation              End of Session Nurse Communication: Mobility status;Precautions  Activity Tolerance: Patient tolerated treatment well Patient left: in chair;with call bell/phone within reach   Time: 0100-7121 OT Time Calculation (min): 10 min Charges:  OT General  Charges $OT Visit: 1 Procedure OT Evaluation $Initial OT Evaluation Tier I: 1 Procedure G-Codes:    Peri Maris 09/01/14, 1:55 PM  Pager: [phone]

## 2014-08-06 NOTE — Evaluation (Signed)
SLP Cancellation Note  Patient Details Name: MAYLYNN ORZECHOWSKI MRN: [id number] DOB: 09/05/1940   Cancelled treatment:       Reason Eval/Treat Not Completed: Other (comment) (pt passed RNSSS, please order SLP if indicated)  Luanna Salk, [place] [facility] SLP [phone]

## 2014-08-06 NOTE — Discharge Instructions (Signed)
Ischemic Stroke °A stroke (cerebrovascular accident) is the sudden death of brain tissue. It is a medical emergency. A stroke can cause permanent loss of brain function. This can cause problems with different parts of your body. A transient ischemic attack (TIA) is different because it does not cause permanent damage. A TIA is a short-lived problem of poor blood flow affecting a part of the brain. A TIA is also a serious problem because having a TIA greatly increases the chances of having a stroke. When symptoms first develop, you cannot know if the problem might be a stroke or a TIA. °CAUSES  °A stroke is caused by a decrease of oxygen supply to an area of your brain. It is usually the result of a small blood clot or collection of cholesterol or fat (plaque) that blocks blood flow in the brain. A stroke can also be caused by blocked or damaged carotid arteries.  °RISK FACTORS °· High blood pressure (hypertension). °· High cholesterol. °· Diabetes mellitus. °· Heart disease. °· The buildup of plaque in the blood vessels (peripheral artery disease or atherosclerosis). °· The buildup of plaque in the blood vessels providing blood and oxygen to the brain (carotid artery stenosis). °· An abnormal heart rhythm (atrial fibrillation). °· Obesity. °· Smoking. °· Taking oral contraceptives (especially in combination with smoking). °· Physical inactivity. °· A diet high in fats, salt (sodium), and calories. °· Alcohol use. °· Use of illegal drugs (especially cocaine and methamphetamine). °· Being African American. °· Being over the age of 55. °· Family history of stroke. °· Previous history of blood clots, stroke, TIA, or heart attack. °· Sickle cell disease. °SYMPTOMS  °These symptoms usually develop suddenly, or may be newly present upon awakening from sleep: °· Sudden weakness or numbness of the face, arm, or leg, especially on one side of the body. °· Sudden trouble walking or difficulty moving arms or legs. °· Sudden  confusion. °· Sudden personality changes. °· Trouble speaking (aphasia) or understanding. °· Difficulty swallowing. °· Sudden trouble seeing in one or both eyes. °· Double vision. °· Dizziness. °· Loss of balance or coordination. °· Sudden severe headache with no known cause. °· Trouble reading or writing. °DIAGNOSIS  °Your health care provider can often determine the presence or absence of a stroke based on your symptoms, history, and physical exam. Computed tomography (CT) of the brain is usually performed to confirm the stroke, determine causes, and determine stroke severity. Other tests may be done to find the cause of the stroke. These tests may include: °· Electrocardiography. °· Continuous heart monitoring. °· Echocardiography. °· Carotid ultrasonography. °· Magnetic resonance imaging (MRI). °· A scan of the brain circulation. °· Blood tests. °PREVENTION  °The risk of a stroke can be decreased by appropriately treating high blood pressure, high cholesterol, diabetes, heart disease, and obesity and by quitting smoking, limiting alcohol, and staying physically active. °TREATMENT  °Time is of the essence. It is important to seek treatment at the first sign of these symptoms because you may receive a medicine to dissolve the clot (thrombolytic) that cannot be given if too much time has passed since your symptoms began. Even if you do not know when your symptoms began, get treatment as soon as possible as there are other treatment options available including oxygen, intravenous (IV) fluids, and medicines to thin the blood (anticoagulants). Treatment of stroke depends on the duration, severity, and cause of your symptoms. Medicines and dietary changes may be used to address diabetes, high blood   pressure, and other risk factors. Physical, speech, and occupational therapists will assess you and work with you to improve any functions impaired by the stroke. Measures will be taken to prevent short-term and long-term  complications, including infection from breathing foreign material into the lungs (aspiration pneumonia), blood clots in the legs, bedsores, and falls. Rarely, surgery may be needed to remove large blood clots or to open up blocked arteries. °HOME CARE INSTRUCTIONS  °· Take medicines only as directed by your health care provider. Follow the directions carefully. Medicines may be used to control risk factors for a stroke. Be sure you understand all your medicine instructions. °· You may be told to take a medicine to thin the blood, such as aspirin or the anticoagulant warfarin. Warfarin needs to be taken exactly as instructed. °¨ Too much and too little warfarin are both dangerous. Too much warfarin increases the risk of bleeding. Too little warfarin continues to allow the risk for blood clots. While taking warfarin, you will need to have regular blood tests to measure your blood clotting time. These blood tests usually include both the PT and INR tests. The PT and INR results allow your health care provider to adjust your dose of warfarin. The dose can change for many reasons. It is critically important that you take warfarin exactly as prescribed, and that you have your PT and INR levels drawn exactly as directed. °¨ Many foods, especially foods high in vitamin K, can interfere with warfarin and affect the PT and INR results. Foods high in vitamin K include spinach, kale, broccoli, cabbage, collard and turnip greens, brussels sprouts, peas, cauliflower, seaweed, and parsley, as well as beef and pork liver, green tea, and soybean oil. You should eat a consistent amount of foods high in vitamin K. Avoid major changes in your diet, or notify your health care provider before changing your diet. Arrange a visit with a dietitian to answer your questions. °¨ Many medicines can interfere with warfarin and affect the PT and INR results. You must tell your health care provider about any and all medicines you take. This  includes all vitamins and supplements. Be especially cautious with aspirin and anti-inflammatory medicines. Do not take or discontinue any prescribed or over-the-counter medicine except on the advice of your health care provider or pharmacist. °¨ Warfarin can have side effects, such as excessive bruising or bleeding. You will need to hold pressure over cuts for longer than usual. Your health care provider or pharmacist will discuss other potential side effects. °¨ Avoid sports or activities that may cause injury or bleeding. °¨ Be mindful when shaving, flossing your teeth, or handling sharp objects. °¨ Alcohol can change the body's ability to handle warfarin. It is best to avoid alcoholic drinks or consume only very small amounts while taking warfarin. Notify your health care provider if you change your alcohol intake. °¨ Notify your dentist or other health care providers before procedures. °· If swallow studies have determined that your swallowing reflex is present, you should eat healthy foods. Including 5 or more servings of fruits and vegetables a day may reduce the risk of stroke. Foods may need to be a certain consistency (soft or pureed), or small bites may need to be taken in order to avoid aspirating or choking. Certain dietary changes may be advised to address high blood pressure, high cholesterol, diabetes, or obesity. °¨ Food choices that are low in sodium, saturated fat, trans fat, and cholesterol are recommended to manage high blood pressure. °¨   Food choies that are high in fiber, and low in saturated fat, trans fat, and cholesterol may control cholesterol levels. °¨ Controlling carbohydrates and sugar intake is recommended to manage diabetes. °¨ Reducing calorie intake and making food choices that are low in sodium, saturated fat, trans fat, and cholesterol are recommended to manage obesity. °· Maintain a healthy weight. °· Stay physically active. It is recommended that you get at least 30 minutes of  activity on all or most days. °· Do not use any tobacco products including cigarettes, chewing tobacco, or electronic cigarettes. °· Limit alcohol use even if you are not taking warfarin. Moderate alcohol use is considered to be: °¨ No more than 2 drinks each day for men. °¨ No more than 1 drink each day for nonpregnant women. °· Home safety. A safe home environment is important to reduce the risk of falls. Your health care provider may arrange for specialists to evaluate your home. Having grab bars in the bedroom and bathroom is often important. Your health care provider may arrange for equipment to be used at home, such as raised toilets and a seat for the shower. °· Physical, occupational, and speech therapy. Ongoing therapy may be needed to maximize your recovery after a stroke. If you have been advised to use a walker or a cane, use it at all times. Be sure to keep your therapy appointments. °· Follow all instructions for follow-up with your health care provider. This is very important. This includes any referrals, physical therapy, rehabilitation, and lab tests. Proper follow-up can prevent another stroke from occurring. °SEEK MEDICAL CARE IF: °· You have personality changes. °· You have difficulty swallowing. °· You are seeing double. °· You have dizziness. °· You have a fever. °· You have skin breakdown. °SEEK IMMEDIATE MEDICAL CARE IF:  °Any of these symptoms may represent a serious problem that is an emergency. Do not wait to see if the symptoms will go away. Get medical help right away. Call your local emergency services (911 in [place] Do not drive yourself to the hospital. °· You have sudden weakness or numbness of the face, arm, or leg, especially on one side of the body. °· You have sudden trouble walking or difficulty moving arms or legs. °· You have sudden confusion. °· You have trouble speaking (aphasia) or understanding. °· You have sudden trouble seeing in one or both eyes. °· You have a loss of  balance or coordination. °· You have a sudden, severe headache with no known cause. °· You have new chest pain or an irregular heartbeat. °· You have a partial or total loss of consciousness. °Document Released: 01/24/2005 Document Revised: 06/10/2013 Document Reviewed: 09/04/2011 °[employer]® Patient Information ©2015 [employer]. This information is not intended to replace advice given to you by your health care provider. Make sure you discuss any questions you have with your health care provider. ° °

## 2014-08-06 NOTE — Progress Notes (Signed)
STROKE TEAM PROGRESS NOTE   SUBJECTIVE (INTERVAL HISTORY) Patient had elective right paraophthalmic carotid artery pipeline stent embolization 6 months ago. She came in yesterday for elective diagnostic cerebral catheter angiogram and postprocedure developed right hemifacial and right hand paresthesias which have persisted. Her husband and family are at the bedside.  Overall she feels her condition is unchanged since yesterday. She still has numbness in her right fingertips and right whole face.   OBJECTIVE Temp:  [97.7 F (36.5 C)-98.8 F (37.1 C)] 98.1 F (36.7 C) (06/29 0633) Pulse Rate:  [66-85] 71 (06/29 1517) Cardiac Rhythm:  [-] Normal sinus rhythm (06/29 0822) Resp:  [12-20] 16 (06/29 0633) BP: (115-152)/(58-103) 144/67 mmHg (06/29 0633) SpO2:  [91 %-100 %] 98 % (06/29 0633)   Recent Labs Lab 08/05/14 0824 08/05/14 1147  GLUCAP 105* 98    Recent Labs Lab 08/05/14 0918 08/06/14 0605  NA 138 139  K 4.5 4.5  CL 107 105  CO2 25 25  GLUCOSE 124* 112*  BUN 19 16  CREATININE 1.23* 1.28*  CALCIUM 9.2 8.9   No results for input(s): AST, ALT, ALKPHOS, BILITOT, PROT, ALBUMIN in the last 168 hours.  Recent Labs Lab 08/05/14 0918 08/06/14 0605  WBC 7.0 8.6  HGB 15.3* 14.7  HCT 44.9 44.1  MCV 88.4 90.7  PLT 326 294   No results for input(s): CKTOTAL, CKMB, CKMBINDEX, TROPONINI in the last 168 hours. No results for input(s): LABPROT, INR in the last 72 hours. No results for input(s): COLORURINE, LABSPEC, [place], GLUCOSEU, HGBUR, BILIRUBINUR, KETONESUR, PROTEINUR, UROBILINOGEN, NITRITE, LEUKOCYTESUR in the last 72 hours.  Invalid input(s): APPERANCEUR     Component Value Date/Time   CHOL 173 08/06/2014 0605   TRIG 180* 08/06/2014 0605   HDL 48 08/06/2014 0605   CHOLHDL 3.6 08/06/2014 0605   VLDL 36 08/06/2014 0605   LDLCALC 89 08/06/2014 0605   Lab Results  Component Value Date   HGBA1C 6.3* 08/05/2014   No results found for: LABOPIA, COCAINSCRNUR,  LABBENZ, AMPHETMU, THCU, LABBARB  No results for input(s): ETH in the last 168 hours.  Cerebral Angio 08/05/2014 Severe stenosis with flow limitation of the proximal RICA Pipeline stent likely representing hyperendothelialization.  Ct Head Wo Contrast 08/05/2014   1. The right MCA appears less dense than the left, with residual intravascular contrast present. The possibility of Vasa spasm lower reduced flow to the right CEA MCA is a possibility, but this could be equally well simply be due to slice selection. No secondary signs of acute stroke. No intracranial hemorrhage identified. 2. Right ICA stent is stenotic proximally, images 24-25 series 3. 3. Probably clinically inconsequential meningiomas on the left. 4. Periventricular white matter and corona radiata hypodensities favor chronic ischemic microvascular white matter disease. 5. Mild chronic ethmoid sinusitis.    Mr Brain Wo Contrast 08/05/2014   Small bilateral medial inferior parietal lobe infarcts greater on the right (bordering the occipital lobe).  Suggestion of tiny acute infarct right thalamus.  Mild small vessel disease type changes.  9 mm calcified meningioma left petrous apex without surrounding vasogenic edema.  En plaque very small meningioma medial right temporal region bordering the cavernous sinus (versus focal calcification) without associated mass effect.  Thrombosed right internal carotid artery aneurysm projects superiorly with mass effect upon the right optic nerve and right aspect of the optic chiasm had with superior displacement of the A1 segment of the right anterior cerebral artery.  Right vertebral artery is poorly delineated.  PHYSICAL EXAM Pleasant elderly Caucasian lady currently not in distress. . Afebrile. Head is nontraumatic. Neck is supple without bruit.    Cardiac exam no murmur or gallop. Lungs are clear to auscultation. Distal pulses are well felt. Neurological Exam : Awake alert oriented 3 with normal  speech and language function. No aphasia or apraxia dysarthria. Extraocular movements are full range without nystagmus. Fundi were not visualized. Vision acuity seems adequate. Visual fields are full to bedside confrontational testing. No facial weakness. Tongue midline. Motor system exam reveals no upper or lower extremity drift. No focal weakness. Right hemifacial decreased sensation. Deep tendon reflexes are symmetric. Plantars are downgoing. Gait was not tested. ASSESSMENT/PLAN Ms. LARK LANGENFELD is a 74 y.o. female with history of R ICA aneurysm stent who s/p angio developed R facial numbness (forehead), R finger tips and wavy lines in her visual fields. She did not receive IV t-PA due to minimal symptoms.   Stroke:  Post procedure R whole face and R hand fingertip numbness due to Left brainstem infarct not seen on MRI given ongoing symptoms as well as asymptomatic bilateral R>L parietal/occipital cortical and subcortical infarcts and a asymptomatic R internal capsule infarct. All infarcts embolic secondary to elective followup cerebral angiogram which showed an asymptomatic RICA paraopthalmic thrombosed aneurysm with intrastent stenosis. Meningioma, L petrous apex, asymptomatic  Cerebral angiogram asymptomatic RICA paraopthalmic thrombosed aneurysm with intrastent stenosis which Dr. Kathyrn Sheriff has treated. Given ongoing stenosis, plans for ongoing aspirin and Plavix, likely lifelong  Resultant  R whole face and R hand fingertip numbness   MRI  bilateral parietal cortical and subcortical infarcts, RL ICA infarct, Meningioma, RICA paraopthalmic thrombosed aneurysm  2D Echo  pending   LDL 89  HgbA1c 6.3  Heparin 5000 units sq tid for VTE prophylaxis  Diet Heart Room service appropriate?: Yes; Fluid consistency:: Thin  aspirin 325 mg orally every day and clopidogrel 75 mg orally every day prior to admission, now on aspirin 325 mg orally every day and clopidogrel 75 mg orally every day.  Agree with plans to continue at discharge  Therapy recommendations:  Outpatient PT  Disposition:  Return home with outpatient therapies  Hypertension  Stable  Hyperlipidemia  Home meds:  No statin  LDL 89, goal < 70  Added low dose statin  Continue statin at discharge  Diabetes  HgbA1c 6.3, goal < 7.0  Diet Controlled  Other Stroke Risk Factors  Advanced age  Former Cigarette smoker  Obesity, Body mass index is 32.6 kg/(m^2).   Other Active Problems  CKD  Other Pertinent History  left breast cancer  Hospital day # [place] for Pager information 08/06/2014 2:57 PM  I have personally examined this patient, reviewed notes, independently viewed imaging studies, participated in medical decision making and plan of care. I have made any additions or clarifications directly to the above note. Agree with note above. She presented with right hemi-face and hand paresthesias likely due to suspected small brainstem infarct not visualized on MRI following embolic bicerebral strokes postcerebral catheter angiogram  . She also has large right paraophthalmic carotid artery aneurysm treated with pipeline stent which is thrombosed but currently asymptomatic. She is at risk for recurrent strokes, TIAs or neurological worsening and needs ongoing evaluation and aggressive risk factor management. Given significant IntraStent stenosis will continue aspirin and Plavix and aggressive risk factor modification to low chances of subsequent strokes .Discussed with patient, husband and Dr.Nundkumar, and answered questions  Pramod  Leonie Man, [place] Pager: [phone] 08/06/2014 3:15 PM    To contact Stroke Continuity provider, please refer to [email or web address] After hours, contact General Neurology

## 2014-08-06 NOTE — Progress Notes (Signed)
Utilization review completed. Raylene Carmickle, RN, BSN. 

## 2014-08-06 NOTE — Progress Notes (Signed)
D/C orders received, pt for D/C home today.  IV and telemetry D/C.  Rx and D/C instructions given with verbalized understanding.  Family at bedside to assist with D/C.  Staff brought pt downstairs via wheelchair.  

## 2014-08-06 NOTE — Progress Notes (Signed)
  Echocardiogram 2D Echocardiogram has been performed.  Briana Ochoa M 08/06/2014, 9:05 AM

## 2014-08-06 NOTE — Discharge Summary (Signed)
[facility]  Physician Discharge Summary   Patient ID: Briana Ochoa MRN: [id number] DOB/AGE: 1940-03-29 74 y.o.  Admit date: 08/05/2014 Discharge date: 08/06/2014  PCP: Arnette Norris, MD  DISCHARGE DIAGNOSES:  Principal Problem:   Rt facial numbness Active Problems:   Vertigo   Right internal carotid artery aneurysm   Right facial numbness   Cerebral thrombosis with cerebral infarction   Numbness   RECOMMENDATIONS FOR OUTPATIENT FOLLOW UP: 1. Was follow-up with outpatient providers as listed below. 2. Outpatient PT ordered 3. Started on Statin  DISCHARGE CONDITION: fair  Diet recommendation: Heart healthy  Filed Weights   08/05/14 0821  Weight: 86.183 kg (190 lb)    INITIAL HISTORY: 74 year old Caucasian female with a past medical history of hypertension, intracranial aneurysm, status post and placement, chronic kidney disease and diabetes, was undergoing cerebral angiogram when she developed right tongue and facial numbness. She was admitted for further evaluation.  Consultations:  Neurology  Procedures: 2-D echocardiogram Study Conclusions - Left ventricle: The cavity size was normal. There was mild focalbasal hypertrophy of the septum. Systolic function was normal.Wall motion was normal; there were no regional wall motionabnormalities. Doppler parameters are consistent with abnormalleft ventricular relaxation (grade 1 diastolic dysfunction).There was no evidence of elevated ventricular filling pressure byDoppler parameters. - Aortic valve: There was no regurgitation. - Aortic root: The aortic root was normal in size. - Ascending aorta: The ascending aorta was normal in size. - Mitral valve: Calcified annulus. Mildly thickened leaflets . - Left atrium: The atrium was normal in size. - Tricuspid valve: There was no regurgitation. - Pulmonic valve: There was mild regurgitation. - Pulmonary arteries: Systolic pressure was within the normal  range. - Inferior vena cava: The vessel was normal in size. - Pericardium, extracardiac: A trivial pericardial effusion wasidentified.  HOSPITAL COURSE:   Acute embolic stroke secondary to cerebral angiogram Patient continues to have the right facial numbness. Workup otherwise has been unremarkable. Thrombosed aneurysm was noted on MRI. This has been discussed by neurology with neurosurgery. This is apparently an expected finding. Some narrowing within the stent has been noticed. This will require intensive risk management. Continue aspirin and Plavix. She is not noted to be on a statin. This will be initiated. Her LDL is in the 80s. HbA1c is 6.3. She was seen by physical and occupational therapy. Outpatient PT has been recommended.  Right ICA aneurysm with stent As above. Management per her neurosurgeon as an outpatient. Continue with aspirin and Plavix.  Borderline elevated Blood Glucose Patient to discuss this further with her PCP.  History of vertigo Continue meclizine  History of essential hypertension Continue lisinopril  History of chronic kidney disease stage II Creatinine is baseline.  History of Breast Cancer Continue Arimidex  Overall, stable. Discussed with neurology today. Okay for discharge.    PERTINENT LABS:  The results of significant diagnostics from this hospitalization (including imaging, microbiology, ancillary and laboratory) are listed below for reference.     Labs: Basic Metabolic Panel:  Recent Labs Lab 08/05/14 0918 08/06/14 0605  NA 138 139  K 4.5 4.5  CL 107 105  CO2 25 25  GLUCOSE 124* 112*  BUN 19 16  CREATININE 1.23* 1.28*  CALCIUM 9.2 8.9   CBC:  Recent Labs Lab 08/05/14 0918 08/06/14 0605  WBC 7.0 8.6  HGB 15.3* 14.7  HCT 44.9 44.1  MCV 88.4 90.7  PLT 326 294    CBG:  Recent Labs Lab 08/05/14 0824 08/05/14 1147  GLUCAP 105* 98  IMAGING STUDIES Ct Head Wo Contrast  08/05/2014   CLINICAL DATA:  Code  stroke. Cerebral angio procedure performed earlier this month. Right-sided weakness and numbness in the face and tongue.  EXAM: CT HEAD WITHOUT CONTRAST  TECHNIQUE: Contiguous axial images were obtained from the base of the skull through the vertex without intravenous contrast.  COMPARISON:  07/22/2014  FINDINGS: Right cavernous ICA aneurysm with calcified margin observed.  There is residual intravascular contrast. There is a stent in the right internal carotid artery. On image 24 series 3 there appears to be focal narrowing of this stent.  Suspected calcified meningioma along the left posterior fossa anteriorly. Another small meningiomas present along the left sphenoid wing medially.  No acute CVA is identified. No mass lesion or acute intracranial hemorrhage observed.  The left MCA appears somewhat more dense than the right, but this may well be from slice selection rather than thrombus in the left MCA. I discussed this with Dr. some nerve, and he indicated that the patient's current clinical profile is not typical for a large MCA stroke.  Slight hypodensity along the inferior aspect the left lentiform nucleus, probably a dilated perivascular space rather than lacunar infarct.  Periventricular white matter and corona radiata hypodensities favor chronic ischemic microvascular white matter disease.  Mild chronic ethmoid sinusitis.  IMPRESSION: 1. The right MCA appears less dense than the left, with residual intravascular contrast present. The possibility of Vasa spasm lower reduced flow to the right CEA MCA is a possibility, but this could be equally well simply be due to slice selection. No secondary signs of acute stroke. No intracranial hemorrhage identified. 2. Right ICA stent is stenotic proximally, images 24-25 series 3. 3. Probably clinically inconsequential meningiomas on the left. 4. Periventricular white matter and corona radiata hypodensities favor chronic ischemic microvascular white matter disease. 5.  Mild chronic ethmoid sinusitis.  These results were called by telephone at the time of interpretation on 08/05/2014 at 12:25 pm to Dr. Jim Like , who verbally acknowledged these results.   Electronically Signed   By: Van Clines M.D.   On: 08/05/2014 12:26   Mr Brain Wo Contrast  08/05/2014   CLINICAL DATA:  74 year old diabetic hypertensive female with prior endovascular treatment of right internal carotid artery/periophthalmic aneurysm 02/27/2014. During follow-up angiogram patient developed visual changes, right facial numbness and numbness forehead and tips of right hand.  Subsequent encounter.  EXAM: MRI HEAD WITHOUT CONTRAST  TECHNIQUE: Multiplanar, multiecho pulse sequences of the brain and surrounding structures were obtained without intravenous contrast.  COMPARISON:  08/05/2014 head CT. 02/27/2014 catheter angiogram. No comparison brain MR.  FINDINGS: Small bilateral medial inferior parietal lobe infarcts greater on the right (bordering the occipital lobe).  Suggestion of tiny acute infarct right thalamus.  Mild small vessel disease type changes.  No intracranial hemorrhage.  9 mm calcified meningioma left petrous apex without surrounding vasogenic edema.  En plaque very small meningioma medial right temporal region bordering the cavernous sinus (versus focal calcification) without associated mass effect.  Thrombosed right internal carotid artery aneurysm projects superiorly with mass effect upon the right optic nerve and right aspect of the optic chiasm had with superior displacement of the A1 segment of the right anterior cerebral artery.  Right vertebral artery is poorly delineated.  Sella is partially empty.  Cervical medullary junction and pineal region unremarkable.  IMPRESSION:  Small bilateral medial inferior parietal lobe infarcts greater on the right (bordering the occipital lobe).  Suggestion of tiny acute infarct right  thalamus.  Mild small vessel disease type changes.  9 mm  calcified meningioma left petrous apex without surrounding vasogenic edema.  En plaque very small meningioma medial right temporal region bordering the cavernous sinus (versus focal calcification) without associated mass effect.  Thrombosed right internal carotid artery aneurysm projects superiorly with mass effect upon the right optic nerve and right aspect of the optic chiasm had with superior displacement of the A1 segment of the right anterior cerebral artery.  Right vertebral artery is poorly delineated.   Electronically Signed   By: Genia Del M.D.   On: 08/05/2014 17:38    DISCHARGE EXAMINATION: Filed Vitals:   08/06/14 0130 08/06/14 0330 08/06/14 0633 08/06/14 1040  BP: 129/73 137/61 144/67 141/85  Pulse: 68 69 71 74  Temp: 98.8 F (37.1 C) 98.2 F (36.8 C) 98.1 F (36.7 C) 98.4 F (36.9 C)  TempSrc: Oral Oral Oral Oral  Resp: 16 16 16 17   Height:      Weight:      SpO2: 97% 98% 98% 96%   General appearance: alert, cooperative, appears stated age and no distress Resp: clear to auscultation bilaterally Cardio: regular rate and rhythm, S1, S2 normal, no murmur, click, rub or gallop GI: soft, non-tender; bowel sounds normal; no masses,  no organomegaly   DISPOSITION: Home  Discharge Instructions    Ambulatory referral to Physical Therapy    Complete by:  As directed   Stroke     Call MD for:  extreme fatigue    Complete by:  As directed      Call MD for:  persistant dizziness or light-headedness    Complete by:  As directed      Call MD for:  persistant nausea and vomiting    Complete by:  As directed      Call MD for:  severe uncontrolled pain    Complete by:  As directed      Diet - low sodium heart healthy    Complete by:  As directed      Discharge instructions    Complete by:  As directed   Please follow up with your providers as recommended. Take medications as recommended. Please talk to your PCP regarding the slightly elevated blood sugar levels and what you  can do to keep it under better control.  You were cared for by a hospitalist during your hospital stay. If you have any questions about your discharge medications or the care you received while you were in the hospital after you are discharged, you can call the unit and asked to speak with the hospitalist on call if the hospitalist that took care of you is not available. Once you are discharged, your primary care physician will handle any further medical issues. Please note that NO REFILLS for any discharge medications will be authorized once you are discharged, as it is imperative that you return to your primary care physician (or establish a relationship with a primary care physician if you do not have one) for your aftercare needs so that they can reassess your need for medications and monitor your lab values. If you do not have a primary care physician, you can call [phone] for a physician referral.     Increase activity slowly    Complete by:  As directed            ALLERGIES: No Known Allergies   Current Discharge Medication List    START taking these medications   Details  atorvastatin (LIPITOR) 20 MG tablet Take 1 tablet (20 mg total) by mouth daily at 6 PM. Qty: 30 tablet, Refills: 3      CONTINUE these medications which have NOT CHANGED   Details  anastrozole (ARIMIDEX) 1 MG tablet Take 1 tablet (1 mg total) by mouth daily. Qty: 90 tablet, Refills: 6   Associated Diagnoses: Cancer of central portion of left female breast    Hypromellose (ARTIFICIAL TEARS OP) Apply 1 drop to eye daily as needed (dry eyes).     ibuprofen (ADVIL,MOTRIN) 200 MG tablet Take 400 mg by mouth daily as needed (pain).    lisinopril (PRINIVIL,ZESTRIL) 20 MG tablet TAKE 1 TABLET BY MOUTH DAILY. Qty: 90 tablet, Refills: 2    acetaminophen (TYLENOL) 325 MG tablet Take 650 mg by mouth daily as needed (pain).    aspirin 325 MG tablet Take 325 mg by mouth daily.    clopidogrel (PLAVIX) 75 MG tablet  Take 75 mg by mouth daily.     meclizine (ANTIVERT) 12.5 MG tablet Take 12.5 mg by mouth 3 (three) times daily as needed for dizziness.    ondansetron (ZOFRAN-ODT) 4 MG disintegrating tablet Take 4 mg by mouth every 8 (eight) hours as needed for nausea or vomiting.       Follow-up Information    Follow up with Arnette Norris, MD. Schedule an appointment as soon as possible for a visit in 1 week.   Specialty:  Family Medicine   Why:  post hospitalization follow up   Contact information:   [place] [address] [phone]       Schedule an appointment as soon as possible for a visit with Jairo Ben, MD.   Specialty:  Neurosurgery   Why:  as recommended by him.   Contact information:   [address] [place] [address] [place] [address] [phone]       Follow up with SETHI,PRAMOD, MD. Schedule an appointment as soon as possible for a visit in 2 months.   Specialties:  Neurology, Radiology   Why:  post hospitalization follow up   Contact information:   [address] [place] [address] [place]: [facility] Pager [phone]  08/06/2014, 4:55 PM

## 2014-08-07 ENCOUNTER — Telehealth: Payer: Self-pay | Admitting: *Deleted

## 2014-08-07 NOTE — Telephone Encounter (Signed)
Transition Care Management Follow-up Telephone Call  How have you been since you were released from the hospital? Patient reports she is feeling well.   Do you understand why you were in the hospital? yes   Do you understand the discharge instrcutions? yes   Where were you discharged to? Home   Items Reviewed:  Medications reviewed: yes  Allergies reviewed: yes  Dietary changes reviewed: yes  Referrals reviewed: yes, patient to scheduled appointment with PCP (done today), neurosurgeon, and neurology   Functional Questionnaire:  Activities of Daily Living (ADLs):   She states they are independent in the following: bathing and hygiene, feeding, continence, grooming, toileting and dressing States they require assistance with the following: ambulation, patient is using a cane but reports that this is improving  Any transportation issues/concerns?: no   Any patient concerns? no   Confirmed importance and date/time of follow-up visits scheduled: yes  Confirmed with patient if condition begins to worsen call PCP or go to the ER.  Patient was given the office number and encouraged to call back with question or concerns.  : yes

## 2014-08-07 NOTE — Telephone Encounter (Signed)
Transitional care call attempted.  Left message for patient to return call. 

## 2014-08-14 ENCOUNTER — Encounter: Payer: Self-pay | Admitting: Family Medicine

## 2014-08-14 ENCOUNTER — Ambulatory Visit (INDEPENDENT_AMBULATORY_CARE_PROVIDER_SITE_OTHER): Payer: Commercial Managed Care - HMO | Admitting: Family Medicine

## 2014-08-14 VITALS — BP 128/80 | HR 74 | Temp 97.6°F | Wt 191.0 lb

## 2014-08-14 DIAGNOSIS — I10 Essential (primary) hypertension: Secondary | ICD-10-CM | POA: Diagnosis not present

## 2014-08-14 DIAGNOSIS — I671 Cerebral aneurysm, nonruptured: Secondary | ICD-10-CM | POA: Diagnosis not present

## 2014-08-14 DIAGNOSIS — M722 Plantar fascial fibromatosis: Secondary | ICD-10-CM

## 2014-08-14 DIAGNOSIS — I6330 Cerebral infarction due to thrombosis of unspecified cerebral artery: Secondary | ICD-10-CM | POA: Diagnosis not present

## 2014-08-14 DIAGNOSIS — E089 Diabetes mellitus due to underlying condition without complications: Secondary | ICD-10-CM

## 2014-08-14 NOTE — Progress Notes (Signed)
Pre visit review using our clinic review tool, if applicable. No additional management support is needed unless otherwise documented below in the visit note. 

## 2014-08-14 NOTE — Patient Instructions (Signed)
Great to see you. Please do the exercises and let me know how you feel.  Make an appointment with Dr. Lorelei Pont for your knee.

## 2014-08-14 NOTE — Assessment & Plan Note (Signed)
With some remaining residual deficits- right facial numbness.  Not affecting speech, vision or swallowing. Continue current rxs. Follow up with NS and neuro. The patient indicates understanding of these issues and agrees with the plan.

## 2014-08-14 NOTE — Assessment & Plan Note (Signed)
Well controlled.  No changes made. 

## 2014-08-14 NOTE — Assessment & Plan Note (Signed)
Deteriorated.  Admits to eating more sugars and will cut back. No change in rxs today.

## 2014-08-14 NOTE — Progress Notes (Signed)
Subjective:   Patient ID: Briana Ochoa, female    DOB: October 16, 1940, 74 y.o.   MRN: [id number]  Briana Ochoa is a pleasant 74 y.o. year old female who presents to clinic today with Hospitalization Follow-up  on 08/14/2014  HPI:  Note reviewed.  Admitted to [facility] 6/28- 08/06/14 for right facial numbness during cerebral angiogram.  H/o right ICA aneurysm with stent, HTN, breast CA.  Neurology followed pt along with neurosurgery in hospital.  D/c'd home on ASA, plavix and statin.  She still has some right facial numbness.  No visual changes, no speech difficulty or weakness.  She is using a cane because PT told her she needed it but she feels fine without it.  Bottom of right foot and heel have been hurting for past several weeks- "first few steps of the day are a 10/10 in pain." Has not tried anything for it.  Ct Head Wo Contrast  08/05/2014   CLINICAL DATA:  Code stroke. Cerebral angio procedure performed earlier this month. Right-sided weakness and numbness in the face and tongue.  EXAM: CT HEAD WITHOUT CONTRAST  TECHNIQUE: Contiguous axial images were obtained from the base of the skull through the vertex without intravenous contrast.  COMPARISON:  07/22/2014  FINDINGS: Right cavernous ICA aneurysm with calcified margin observed.  There is residual intravascular contrast. There is a stent in the right internal carotid artery. On image 24 series 3 there appears to be focal narrowing of this stent.  Suspected calcified meningioma along the left posterior fossa anteriorly. Another small meningiomas present along the left sphenoid wing medially.  No acute CVA is identified. No mass lesion or acute intracranial hemorrhage observed.  The left MCA appears somewhat more dense than the right, but this may well be from slice selection rather than thrombus in the left MCA. I discussed this with Dr. some nerve, and he indicated that the patient's current clinical profile is not typical for a large  MCA stroke.  Slight hypodensity along the inferior aspect the left lentiform nucleus, probably a dilated perivascular space rather than lacunar infarct.  Periventricular white matter and corona radiata hypodensities favor chronic ischemic microvascular white matter disease.  Mild chronic ethmoid sinusitis.  IMPRESSION: 1. The right MCA appears less dense than the left, with residual intravascular contrast present. The possibility of Vasa spasm lower reduced flow to the right CEA MCA is a possibility, but this could be equally well simply be due to slice selection. No secondary signs of acute stroke. No intracranial hemorrhage identified. 2. Right ICA stent is stenotic proximally, images 24-25 series 3. 3. Probably clinically inconsequential meningiomas on the left. 4. Periventricular white matter and corona radiata hypodensities favor chronic ischemic microvascular white matter disease. 5. Mild chronic ethmoid sinusitis.  These results were called by telephone at the time of interpretation on 08/05/2014 at 12:25 pm to Dr. Jim Like , who verbally acknowledged these results.   Electronically Signed   By: Van Clines M.D.   On: 08/05/2014 12:26   Mr Brain Wo Contrast  08/05/2014   CLINICAL DATA:  74 year old diabetic hypertensive female with prior endovascular treatment of right internal carotid artery/periophthalmic aneurysm 02/27/2014. During follow-up angiogram patient developed visual changes, right facial numbness and numbness forehead and tips of right hand.  Subsequent encounter.  EXAM: MRI HEAD WITHOUT CONTRAST  TECHNIQUE: Multiplanar, multiecho pulse sequences of the brain and surrounding structures were obtained without intravenous contrast.  COMPARISON:  08/05/2014 head CT. 02/27/2014 catheter angiogram. No comparison  brain MR.  FINDINGS: Small bilateral medial inferior parietal lobe infarcts greater on the right (bordering the occipital lobe).  Suggestion of tiny acute infarct right thalamus.   Mild small vessel disease type changes.  No intracranial hemorrhage.  9 mm calcified meningioma left petrous apex without surrounding vasogenic edema.  En plaque very small meningioma medial right temporal region bordering the cavernous sinus (versus focal calcification) without associated mass effect.  Thrombosed right internal carotid artery aneurysm projects superiorly with mass effect upon the right optic nerve and right aspect of the optic chiasm had with superior displacement of the A1 segment of the right anterior cerebral artery.  Right vertebral artery is poorly delineated.  Sella is partially empty.  Cervical medullary junction and pineal region unremarkable.  IMPRESSION:  Small bilateral medial inferior parietal lobe infarcts greater on the right (bordering the occipital lobe).  Suggestion of tiny acute infarct right thalamus.  Mild small vessel disease type changes.  9 mm calcified meningioma left petrous apex without surrounding vasogenic edema.  En plaque very small meningioma medial right temporal region bordering the cavernous sinus (versus focal calcification) without associated mass effect.  Thrombosed right internal carotid artery aneurysm projects superiorly with mass effect upon the right optic nerve and right aspect of the optic chiasm had with superior displacement of the A1 segment of the right anterior cerebral artery.  Right vertebral artery is poorly delineated.   Electronically Signed   By: Genia Del M.D.   On: 08/05/2014 17:38     Elevated a1c- Lab Results  Component Value Date   HGBA1C 6.3* 08/05/2014   Current Outpatient Prescriptions on File Prior to Visit  Medication Sig Dispense Refill  . acetaminophen (TYLENOL) 325 MG tablet Take 650 mg by mouth daily as needed (pain).    [name] anastrozole (ARIMIDEX) 1 MG tablet Take 1 tablet (1 mg total) by mouth daily. 90 tablet 6  . aspirin 325 MG tablet Take 325 mg by mouth daily.    [name] atorvastatin (LIPITOR) 20 MG tablet Take 1  tablet (20 mg total) by mouth daily at 6 PM. 30 tablet 3  . clopidogrel (PLAVIX) 75 MG tablet Take 75 mg by mouth daily.     . Hypromellose (ARTIFICIAL TEARS OP) Apply 1 drop to eye daily as needed (dry eyes).     [name] ibuprofen (ADVIL,MOTRIN) 200 MG tablet Take 400 mg by mouth daily as needed (pain).    [name] lisinopril (PRINIVIL,ZESTRIL) 20 MG tablet TAKE 1 TABLET BY MOUTH DAILY. 90 tablet 2  . meclizine (ANTIVERT) 12.5 MG tablet Take 12.5 mg by mouth 3 (three) times daily as needed for dizziness.    . ondansetron (ZOFRAN-ODT) 4 MG disintegrating tablet Take 4 mg by mouth every 8 (eight) hours as needed for nausea or vomiting.     No current facility-administered medications on file prior to visit.    No Known Allergies  Past Medical History  Diagnosis Date  . Hypertension   . Vertigo   . Cancer     lt breast  . 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 Radiation 10/21/13-11/11/13    Left Breast  . Chronic kidney disease     renal insuffic, Dr. Jaci Carrel- [place]  . Complication of anesthesia 1970's    difficult time waking- felt like she couldn't breathe  . Heart murmur     Echo > 10 yrs. ago, in [place]   . Diabetes mellitus without complication  diet controlled   . Arthritis     L knee, R shoulder  . Arthritis of knee     Past Surgical History  Procedure Laterality Date  . Cesarean section  1964  . Cholecystectomy    . Splenectomy, total    . Breast biopsy      Left  . Finger surgery      lt thumb  from dog bite  . Breast lumpectomy with needle localization Left 09/19/2013    Procedure: LEFT BREAST LUMPECTOMY WITH DOUBLE WIRE BRACKETED  NEEDLE LOCALIZATION;  Surgeon: [name], MD;  Location: [facility];  Service: General;  Laterality: Left;  . Tonsillectomy    . Multiple tooth extractions      top palate- wears full plate  . Radiology with anesthesia N/A 01/09/2014    Procedure: Embolization;  Surgeon: Consuella Lose, MD;  Location: [facility];  Service: Radiology;  Laterality: N/A;    Family History  Problem Relation Age of Onset  . Cancer Mother     kidney  . Heart attack Paternal Grandfather   . Breast cancer Maternal Aunt 80  . Breast cancer Paternal Aunt 74    History   Social History  . Marital Status: Widowed    Spouse Name: N/A  . Number of Children: N/A  . Years of Education: N/A   Occupational History  .      Retired   Social History Main Topics  . Smoking status: Former Smoker -- 1.50 packs/day    Types: Cigarettes    Quit date: 03/06/2003  . Smokeless tobacco: Never Used  . Alcohol Use: No  . Drug Use: No  . Sexual Activity: Not on file   Other Topics Concern  . Not on file   Social History Narrative   Widowed 2013   5 children   17 grandchildren      Does not have a living will-desires CPR, does not want prolonged life support if futile.   The PMH, PSH, Social History, Family History, Medications, and allergies have been reviewed in [facility], and have been updated if relevant.    Review of Systems  Constitutional: Negative.   HENT: Negative.   Eyes: Negative.   Respiratory: Negative.   Cardiovascular: Negative.   Gastrointestinal: Negative.   Endocrine: Negative.   Genitourinary: Negative.   Musculoskeletal: Positive for myalgias.  Skin: Negative.   Neurological: Positive for numbness. Negative for dizziness, tremors, seizures, syncope, facial asymmetry, speech difficulty, weakness, light-headedness and headaches.  Psychiatric/Behavioral: Negative.   All other systems reviewed and are negative.      Objective:    BP 128/80 mmHg  Pulse 74  Temp(Src) 97.6 F (36.4 C) (Oral)  Wt 191 lb (86.637 kg)  SpO2 94%   Physical Exam  Constitutional: She is oriented to person, place, and time. She appears well-developed and well-nourished. No distress.  HENT:  Head: Normocephalic.  Eyes: Conjunctivae are normal.  Cardiovascular: Normal rate and regular  rhythm.   Pulmonary/Chest: Breath sounds normal. No respiratory distress. She has no wheezes. She has no rales. She exhibits no tenderness.  Abdominal: Soft.  Musculoskeletal: Normal range of motion. She exhibits no edema.       Right foot: There is tenderness. There is normal range of motion, no bony tenderness, no swelling, normal capillary refill, no crepitus, no deformity and no laceration.  Neurological: She is alert and oriented to person, place, and time. No cranial nerve deficit.  Skin: Skin is warm and dry.  Psychiatric: She has a normal mood and affect. Her behavior is normal. Judgment and thought content normal.  Nursing note and vitals reviewed.         Assessment & Plan:   Cerebral thrombosis with cerebral infarction  Plantar fasciitis of right foot No Follow-up on file.

## 2014-08-14 NOTE — Assessment & Plan Note (Signed)
New- handout given from sports med advisor with exercises for plantar fascitis. No NSAID given her ASA and plavix use. Call or return to clinic prn if these symptoms worsen or fail to improve as anticipated. The patient indicates understanding of these issues and agrees with the plan.

## 2014-08-15 ENCOUNTER — Ambulatory Visit
Admission: RE | Admit: 2014-08-15 | Discharge: 2014-08-15 | Disposition: A | Discharge status: Home / Self Care | Payer: Commercial Managed Care - HMO | Source: Ambulatory Visit | Attending: Family Medicine | Admitting: Family Medicine

## 2014-08-15 DIAGNOSIS — Z9889 Other specified postprocedural states: Secondary | ICD-10-CM

## 2014-08-15 DIAGNOSIS — Z853 Personal history of malignant neoplasm of breast: Secondary | ICD-10-CM

## 2014-08-18 ENCOUNTER — Institutional Professional Consult (permissible substitution) (INDEPENDENT_AMBULATORY_CARE_PROVIDER_SITE_OTHER): Payer: Commercial Managed Care - HMO | Admitting: Family Medicine

## 2014-08-20 ENCOUNTER — Encounter: Payer: Self-pay | Admitting: Family Medicine

## 2014-08-20 ENCOUNTER — Ambulatory Visit (INDEPENDENT_AMBULATORY_CARE_PROVIDER_SITE_OTHER): Payer: Commercial Managed Care - HMO | Admitting: Family Medicine

## 2014-08-20 VITALS — BP 140/78 | HR 71 | Temp 98.2°F | Ht 64.0 in | Wt 191.0 lb

## 2014-08-20 DIAGNOSIS — M25562 Pain in left knee: Secondary | ICD-10-CM

## 2014-08-20 DIAGNOSIS — M1712 Unilateral primary osteoarthritis, left knee: Secondary | ICD-10-CM | POA: Diagnosis not present

## 2014-08-20 MED ORDER — TRAMADOL HCL 50 MG PO TABS: 50.0000 mg | ORAL_TABLET | Freq: Four times a day (QID) | ORAL | Status: DC | PRN

## 2014-08-20 MED ORDER — METHYLPREDNISOLONE ACETATE 40 MG/ML IJ SUSP
80.0000 mg | Freq: Once | INTRAMUSCULAR | Status: AC
  Administered 2014-08-20: 80 mg via INTRA_ARTICULAR

## 2014-08-20 NOTE — Progress Notes (Signed)
Pre visit review using our clinic review tool, if applicable. No additional management support is needed unless otherwise documented below in the visit note. 

## 2014-08-20 NOTE — Progress Notes (Signed)
Dr. Frederico Hamman T. Sariah Henkin, MD, [facility] [place], [address] Phone: [phone] Fax: [phone]  08/20/2014  Patient: Briana Ochoa, MRN: [id number], DOB: [birth year], 74 y.o.  Primary Physician:  Arnette Norris, MD  Chief Complaint: Knee Pain  Subjective:   Briana Ochoa is a 74 y.o. very pleasant female patient who presents with the following:  3 years, has been having some L knee pain and had some left knee pain. Went to the ER.  When that got better, had some pain in the front of her knee.  Went to an MD after then, and took some meds over pain - kidney doctor took off of it.   She has had a very significant year for her health including diagnosis of breast cancer, breast cancer treatment, and a recent stroke.  She would like to get back in shape and exercise more, and she is frustrated and limited some by her knee right now.  She is not having any mechanical symptoms at all.  L knee inj  Past Medical History, Surgical History, Social History, Family History, Problem List, Medications, and Allergies have been reviewed and updated if relevant.  Patient Active Problem List   Diagnosis Date Noted  . Plantar fasciitis of right foot 08/14/2014  . Cerebral thrombosis with cerebral infarction 08/06/2014  . Numbness   . Rt facial numbness 08/05/2014  . Right facial numbness 08/05/2014  . URI (upper respiratory infection) 06/25/2014  . Abnormal eye finding 01/20/2014  . Intracranial aneurysm 01/09/2014  . Cancer of central portion of left female breast 08/22/2013  . Right internal carotid artery aneurysm 08/21/2013  . Meningioma, multiple 08/21/2013  . Leukocytosis, unspecified 08/21/2013  . Medicare annual wellness visit, initial 07/17/2013  . Elevated serum creatinine 07/17/2013  . History of tobacco abuse 07/17/2013  . HTN (hypertension) 04/16/2013  . OA (osteoarthritis) of knee 04/16/2013  . Weight gain  04/16/2013  . Diabetes 04/16/2013    Past Medical History  Diagnosis Date  . Hypertension   . Vertigo   . Cancer     lt breast  . 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 Radiation 10/21/13-11/11/13    Left Breast  . Chronic kidney disease     renal insuffic, Dr. Jaci Carrel- [place]  . Complication of anesthesia 1970's    difficult time waking- felt like she couldn't breathe  . Heart murmur     Echo > 10 yrs. ago, in [place]   . Diabetes mellitus without complication     diet controlled   . Arthritis     L knee, R shoulder  . Arthritis of knee     Past Surgical History  Procedure Laterality Date  . Cesarean section  1964  . Cholecystectomy    . Splenectomy, total    . Breast biopsy      Left  . Finger surgery      lt thumb  from dog bite  . Breast lumpectomy with needle localization Left 09/19/2013    Procedure: LEFT BREAST LUMPECTOMY WITH DOUBLE WIRE BRACKETED  NEEDLE LOCALIZATION;  Surgeon: [name], MD;  Location: [facility];  Service: General;  Laterality: Left;  . Tonsillectomy    . Multiple tooth extractions      top palate- wears full plate  . Radiology with anesthesia N/A 01/09/2014    Procedure: Embolization;  Surgeon: Consuella Lose, MD;  Location: [facility];  Service: Radiology;  Laterality: N/A;    History   Social History  . Marital Status: Widowed    Spouse Name: N/A  . Number of Children: N/A  . Years of Education: N/A   Occupational History  .      Retired   Social History Main Topics  . Smoking status: Former Smoker -- 1.50 packs/day    Types: Cigarettes    Quit date: 03/06/2003  . Smokeless tobacco: Never Used  . Alcohol Use: No  . Drug Use: No  . Sexual Activity: Not on file   Other Topics Concern  . Not on file   Social History Narrative   Widowed 2013   5 children   17 grandchildren      Does not have a living will-desires CPR, does not want prolonged life support if futile.      Family History  Problem Relation Age of Onset  . Cancer Mother     kidney  . Heart attack Paternal Grandfather   . Breast cancer Maternal Aunt 80  . Breast cancer Paternal Aunt 34    No Known Allergies  Medication list reviewed and updated in full in [place].  GEN: No fevers, chills. Nontoxic. Primarily MSK c/o today. MSK: Detailed in the HPI GI: tolerating PO intake without difficulty Neuro: No numbness, parasthesias, or tingling associated. Otherwise the pertinent positives of the ROS are noted above.   Objective:   BP 140/78 mmHg  Pulse 71  Temp(Src) 98.2 F (36.8 C) (Oral)  Ht 5' 4"  (1.626 m)  Wt 191 lb (86.637 kg)  BMI 32.77 kg/m2   GEN: WDWN, NAD, Non-toxic, Alert & Oriented x 3 HEENT: Atraumatic, Normocephalic.  Ears and Nose: No external deformity. EXTR: No clubbing/cyanosis/edema NEURO: Normal gait.  PSYCH: Normally interactive. Conversant. Not depressed or anxious appearing.  Calm demeanor.   Knee:  L Gait: Normal heel toe pattern ROM: 0-117 Effusion: minimal Echymosis or edema: none Patellar tendon NT Painful PLICA: neg Patellar grind: negative Medial and lateral patellar facet loading: negative medial and lateral joint lines: medial pain Mcmurray's neg Flexion-pinch pain Varus and valgus stress: stable Lachman: neg Ant and Post drawer: neg Hip abduction, IR, ER: WNL Hip flexion str: 5/5 Hip abd: 5/5 Quad: 5/5 VMO atrophy:No Hamstring concentric and eccentric: 5/5   Radiology: LEFT KNEE - COMPLETE 4+ VIEW  Comparison: none  Findings: Four views of the left knee submitted. There is diffuse osteopenia. No acute fracture or subluxation. Narrowing of medial joint compartment. Small to moderate joint effusion. Narrowing of patellofemoral joint space. Mild spurring of patella.  IMPRESSION: No acute fracture or subluxation. Diffuse osteopenia. Degenerative changes as described above.   Original Report  Authenticated By: Lahoma Crocker, M.D.  The radiological images were independently reviewed by myself in the office and results were reviewed with the patient. My independent interpretation of images:  Patellofemoral and medial compartmental arthritis.  Overall, this would be characterized as mild in nature.  No evidence of fracture or dislocation. Electronically Signed  By: Owens Loffler, MD On: 08/21/2014 5:00 PM   Assessment and Plan:   Primary osteoarthritis of knee, left  Left knee pain - Plan: methylPREDNISolone acetate (DEPO-MEDROL) injection 80 mg  Knee arthritis with exacerbation, known arthritis with films from 2014.  Avoid NSAIDs in this case, but Tylenol and tramadol commendation likely will be helpful along with ice.  She is going to start some tai chi, and I tried to encourage her about this.  Knee  injection to help with acute flare and try to get back to exercise.  Knee Injection, LEFT Patient verbally consented to procedure. Risks (including potential rare risk of infection), benefits, and alternatives explained. Sterilely prepped with Chloraprep. Ethyl cholride used for anesthesia. 8 cc Lidocaine 1% mixed with Depo-Medrol 80 mg injected using the anteromedial approach without difficulty. No complications with procedure and tolerated well. Patient had decreased pain post-injection.   Follow-up: prn  New Prescriptions   TRAMADOL (ULTRAM) 50 MG TABLET    Take 1 tablet (50 mg total) by mouth every 6 (six) hours as needed.   No orders of the defined types were placed in this encounter.    Signed,  Maud Deed. Ollen Rao, MD   Patient's Medications  New Prescriptions   TRAMADOL (ULTRAM) 50 MG TABLET    Take 1 tablet (50 mg total) by mouth every 6 (six) hours as needed.  Previous Medications   ACETAMINOPHEN (TYLENOL) 325 MG TABLET    Take 650 mg by mouth daily as needed (pain).   ANASTROZOLE (ARIMIDEX) 1 MG TABLET    Take 1 tablet (1 mg total) by mouth daily.   ASPIRIN 325 MG  TABLET    Take 325 mg by mouth daily.   ATORVASTATIN (LIPITOR) 20 MG TABLET    Take 1 tablet (20 mg total) by mouth daily at 6 PM.   CLOPIDOGREL (PLAVIX) 75 MG TABLET    Take 75 mg by mouth daily.    HYPROMELLOSE (ARTIFICIAL TEARS OP)    Apply 1 drop to eye daily as needed (dry eyes).    IBUPROFEN (ADVIL,MOTRIN) 200 MG TABLET    Take 400 mg by mouth daily as needed (pain).   LISINOPRIL (PRINIVIL,ZESTRIL) 20 MG TABLET    TAKE 1 TABLET BY MOUTH DAILY.   MECLIZINE (ANTIVERT) 12.5 MG TABLET    Take 12.5 mg by mouth 3 (three) times daily as needed for dizziness.   ONDANSETRON (ZOFRAN-ODT) 4 MG DISINTEGRATING TABLET    Take 4 mg by mouth every 8 (eight) hours as needed for nausea or vomiting.  Modified Medications   No medications on file  Discontinued Medications   No medications on file

## 2014-10-08 ENCOUNTER — Ambulatory Visit (INDEPENDENT_AMBULATORY_CARE_PROVIDER_SITE_OTHER): Payer: Commercial Managed Care - HMO | Admitting: Neurology

## 2014-10-08 ENCOUNTER — Encounter: Payer: Self-pay | Admitting: Neurology

## 2014-10-08 VITALS — BP 141/83 | HR 81 | Ht 64.0 in | Wt 197.2 lb

## 2014-10-08 DIAGNOSIS — I6340 Cerebral infarction due to embolism of unspecified cerebral artery: Secondary | ICD-10-CM | POA: Diagnosis not present

## 2014-10-08 DIAGNOSIS — I639 Cerebral infarction, unspecified: Secondary | ICD-10-CM

## 2014-10-08 NOTE — Progress Notes (Signed)
[facility] [address] [place]. [place] [address] [phone]       OFFICE FOLLOW-UP NOTE  Briana. SHARISSA BRIERLEY Date of Birth:  [birth year] Medical Record Number:  [id number]   HPI: Briana Ochoa is a 89 year pleasant lady seen today for first office follow-up visit following hospital admission for stroke in June 2016.Briana Ochoa is an 74 y.o. female presenting to hospital for follow up angiogram after stenting of right ICA aneurysm. Pateitn did well during procedure but post procedure noted bright wavy lines in her visual field and then right facial numbness in forehead and right finger tips. Code stroke was called and CT obtained showing no acute bleed or infarct. (imaging reviewed) Due to minimal symptoms tPA was not administered. Cerebral catheter angiogram 08/05/14 showed severe stenosis with flow limitation of the proximal right ICA pipeline stent likely representing hyperendothelialization. MRI scan of the brain showed small bilateral medial inferior parietal infarcts greater on the right and possibly tiny acute right thalamic infarct. 9 mm calcified meningioma was noted in the left petrous apex. An enplaque small meningiomas noted in the medial right temporal region. LDL cholesterol was 89 mg percent and hemoglobin A1c was 6.3. Transfer is sick echocardiogram showed normal ejection fraction without cardiac source of embolism. Patient did well and was seen by physical occupational therapy. She is currently home and doing well. She is tolerating aspirin and Plavix with only minor bruising but no bleeding episodes. She states her blood pressure is under good control and it was 141/83 today. She states her sugars have also been under good control. She has no new complaints today. ROS:   14 system review of systems is positive for fatigue, easy bruising, muscle cramps and all other systems negative PMH:  Past Medical History  Diagnosis Date  . Hypertension   .  Vertigo   . Cancer     lt breast  . 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 Radiation 10/21/13-11/11/13    Left Breast  . Chronic kidney disease     renal insuffic, Dr. Jaci Carrel- [facility]  . Complication of anesthesia 1970's    difficult time waking- felt like she couldn't breathe  . Heart murmur     Echo > 10 yrs. ago, in [place]   . Diabetes mellitus without complication     diet controlled   . Arthritis     L knee, R shoulder  . Arthritis of knee     Social History:  Social History   Social History  . Marital Status: Widowed    Spouse Name: N/A  . Number of Children: N/A  . Years of Education: N/A   Occupational History  .      Retired   Social History Main Topics  . Smoking status: Former Smoker -- 1.50 packs/day    Types: Cigarettes    Quit date: 03/06/2003  . Smokeless tobacco: Never Used  . Alcohol Use: No  . Drug Use: No  . Sexual Activity: Not on file   Other Topics Concern  . Not on file   Social History Narrative   Widowed 2013   5 children   17 grandchildren      Does not have a living will-desires CPR, does not want prolonged life support if futile.    Medications:   Current Outpatient Prescriptions on File Prior to Visit  Medication Sig Dispense Refill  . acetaminophen (TYLENOL) 325 MG tablet Take  650 mg by mouth daily as needed (pain).    [name] anastrozole (ARIMIDEX) 1 MG tablet Take 1 tablet (1 mg total) by mouth daily. 90 tablet 6  . aspirin 325 MG tablet Take 325 mg by mouth daily.    [name] atorvastatin (LIPITOR) 20 MG tablet Take 1 tablet (20 mg total) by mouth daily at 6 PM. 30 tablet 3  . clopidogrel (PLAVIX) 75 MG tablet Take 75 mg by mouth daily.     . Hypromellose (ARTIFICIAL TEARS OP) Apply 1 drop to eye daily as needed (dry eyes).     [name] ibuprofen (ADVIL,MOTRIN) 200 MG tablet Take 400 mg by mouth daily as needed (pain).    [name] lisinopril (PRINIVIL,ZESTRIL) 20 MG tablet TAKE 1 TABLET BY MOUTH  DAILY. 90 tablet 2  . meclizine (ANTIVERT) 12.5 MG tablet Take 12.5 mg by mouth 3 (three) times daily as needed for dizziness.    . ondansetron (ZOFRAN-ODT) 4 MG disintegrating tablet Take 4 mg by mouth every 8 (eight) hours as needed for nausea or vomiting.    . traMADol (ULTRAM) 50 MG tablet Take 1 tablet (50 mg total) by mouth every 6 (six) hours as needed. 50 tablet 2   No current facility-administered medications on file prior to visit.    Allergies:  No Known Allergies  Physical Exam General: well developed, well nourished elderly caucasian aldy, seated, in no evident distress Head: head normocephalic and atraumatic.  Neck: supple with no carotid or supraclavicular bruits Cardiovascular: regular rate and rhythm, no murmurs Musculoskeletal: no deformity Skin:  no rash. few petichiae on forearms. Vascular:  Normal pulses all extremities Filed Vitals:   10/08/14 1020  BP: 141/83  Pulse: 81   Neurologic Exam Mental Status: Awake and fully alert. Oriented to place and time. Recent and remote memory intact. Attention span, concentration and fund of knowledge appropriate. Mood and affect appropriate.  Cranial Nerves: Fundoscopic exam reveals sharp disc margins. Pupils equal, briskly reactive to light. Extraocular movements full without nystagmus. Visual fields full to confrontation. Hearing intact. Facial sensation intact. Face, tongue, palate moves normally and symmetrically.  Motor: Normal bulk and tone. Normal strength in all tested extremity muscles. Sensory.: intact to touch ,pinprick .position and vibratory sensation.  Coordination: Rapid alternating movements normal in all extremities. Finger-to-nose and heel-to-shin performed accurately bilaterally. Gait and Station: Arises from chair without difficulty. Stance is normal. Gait demonstrates normal stride length and balance . Able to heel, toe and tandem walk without difficulty.  Reflexes: 1+ and symmetric. Toes downgoing.    NIHSS  0 Modified Rankin  1   ASSESSMENT: 21 year caucasian lady with right face and hand numbness due to small left brainstem infarct not seen on MRI as well as silent bilateral right greater than left parieto-occipital cortical and subcortical infarcts and asymptomatic right internal capsule infarct following elective cerebral catheter angiogram which showed asymptomatic right ICA paraophthalmic thrombosed aneurysm with Intrastent stenosis in June 2016    PLAN: I had a long d/w patient about her recent stroke, risk for recurrent stroke/TIAs, personally independently reviewed imaging studies and stroke evaluation results and answered questions.Continue aspirin 81 mg orally every day and clopidogrel 75 mg orally every day  for secondary stroke prevention given intrastent stenosis and maintain strict control of hypertension with blood pressure goal below 130/90, diabetes with hemoglobin A1c goal below 6.5% and lipids with LDL cholesterol goal below 70 mg/dL. I also advised the patient to eat a healthy diet with plenty of whole grains, cereals, fruits  and vegetables, exercise regularly and maintain ideal body weight .Greater than 50% of time during this 25 minute visit was spent on counseling and coordination of care. Followup in the future with me in 6 months or call earlier if necessary.   Antony Contras, MD  Note: This document was prepared with digital dictation and possible smart phrase technology. Any transcriptional errors that result from this process are unintentional

## 2014-10-08 NOTE — Patient Instructions (Signed)
I had a long d/w patient about her recent stroke, risk for recurrent stroke/TIAs, personally independently reviewed imaging studies and stroke evaluation results and answered questions.Continue aspirin 81 mg orally every day and clopidogrel 75 mg orally every day  for secondary stroke prevention given intrastent stenosis and maintain strict control of hypertension with blood pressure goal below 130/90, diabetes with hemoglobin A1c goal below 6.5% and lipids with LDL cholesterol goal below 70 mg/dL. I also advised the patient to eat a healthy diet with plenty of whole grains, cereals, fruits and vegetables, exercise regularly and maintain ideal body weight .greater than 50% of time during this 25 minute visit was spent on counseling and coordination of care. Followup in the future with me in 6 months or call earlier if necessary. Stroke Prevention Some medical conditions and behaviors are associated with an increased chance of having a stroke. You may prevent a stroke by making healthy choices and managing medical conditions. HOW CAN I REDUCE MY RISK OF HAVING A STROKE?   Stay physically active. Get at least 30 minutes of activity on most or all days.  Do not smoke. It may also be helpful to avoid exposure to secondhand smoke.  Limit alcohol use. Moderate alcohol use is considered to be:  No more than 2 drinks per day for men.  No more than 1 drink per day for nonpregnant women.  Eat healthy foods. This involves:  Eating 5 or more servings of fruits and vegetables a day.  Making dietary changes that address high blood pressure (hypertension), high cholesterol, diabetes, or obesity.  Manage your cholesterol levels.  Making food choices that are high in fiber and low in saturated fat, trans fat, and cholesterol may control cholesterol levels.  Take any prescribed medicines to control cholesterol as directed by your health care provider.  Manage your diabetes.  Controlling your carbohydrate  and sugar intake is recommended to manage diabetes.  Take any prescribed medicines to control diabetes as directed by your health care provider.  Control your hypertension.  Making food choices that are low in salt (sodium), saturated fat, trans fat, and cholesterol is recommended to manage hypertension.  Take any prescribed medicines to control hypertension as directed by your health care provider.  Maintain a healthy weight.  Reducing calorie intake and making food choices that are low in sodium, saturated fat, trans fat, and cholesterol are recommended to manage weight.  Stop drug abuse.  Avoid taking birth control pills.  Talk to your health care provider about the risks of taking birth control pills if you are over 62 years old, smoke, get migraines, or have ever had a blood clot.  Get evaluated for sleep disorders (sleep apnea).  Talk to your health care provider about getting a sleep evaluation if you snore a lot or have excessive sleepiness.  Take medicines only as directed by your health care provider.  For some people, aspirin or blood thinners (anticoagulants) are helpful in reducing the risk of forming abnormal blood clots that can lead to stroke. If you have the irregular heart rhythm of atrial fibrillation, you should be on a blood thinner unless there is a good reason you cannot take them.  Understand all your medicine instructions.  Make sure that other conditions (such as anemia or atherosclerosis) are addressed. SEEK IMMEDIATE MEDICAL CARE IF:   You have sudden weakness or numbness of the face, arm, or leg, especially on one side of the body.  Your face or eyelid droops to one  side.  You have sudden confusion.  You have trouble speaking (aphasia) or understanding.  You have sudden trouble seeing in one or both eyes.  You have sudden trouble walking.  You have dizziness.  You have a loss of balance or coordination.  You have a sudden, severe headache  with no known cause.  You have new chest pain or an irregular heartbeat. Any of these symptoms may represent a serious problem that is an emergency. Do not wait to see if the symptoms will go away. Get medical help at once. Call your local emergency services (911 in [place] Do not drive yourself to the hospital. Document Released: 03/03/2004 Document Revised: 06/10/2013 Document Reviewed: 07/27/2012 [facility] Patient Information 2015 [place], [place]. This information is not intended to replace advice given to you by your health care provider. Make sure you discuss any questions you have with your health care provider.

## 2014-10-14 ENCOUNTER — Telehealth: Payer: Self-pay | Admitting: Hematology and Oncology

## 2014-10-14 ENCOUNTER — Ambulatory Visit (HOSPITAL_BASED_OUTPATIENT_CLINIC_OR_DEPARTMENT_OTHER): Payer: Commercial Managed Care - HMO | Admitting: Hematology and Oncology

## 2014-10-14 ENCOUNTER — Encounter: Payer: Self-pay | Admitting: Hematology and Oncology

## 2014-10-14 VITALS — BP 137/78 | HR 83 | Temp 97.5°F | Resp 18 | Ht 64.0 in | Wt 194.6 lb

## 2014-10-14 DIAGNOSIS — C50112 Malignant neoplasm of central portion of left female breast: Secondary | ICD-10-CM

## 2014-10-14 DIAGNOSIS — I6330 Cerebral infarction due to thrombosis of unspecified cerebral artery: Secondary | ICD-10-CM | POA: Diagnosis not present

## 2014-10-14 DIAGNOSIS — Z853 Personal history of malignant neoplasm of breast: Secondary | ICD-10-CM

## 2014-10-14 NOTE — Telephone Encounter (Signed)
Appointments made and avs printed for patient °

## 2014-10-14 NOTE — Progress Notes (Signed)
Patient Care Team: Lucille Passy, MD as PCP - General (Family Medicine) Fanny Skates, MD as Consulting Physician (General Surgery) Thea Silversmith, MD as Consulting Physician (Radiation Oncology)  DIAGNOSIS: Cancer of central portion of left female breast   Staging form: Breast, AJCC 7th Edition     Clinical: Stage 0 (Tis (DCIS), N0, cM0) - Unsigned       Staging comments: Staged at breast conference 7.22.15      Pathologic: Stage 0 (Tis (DCIS)(2), N0, cM0) - Unsigned   SUMMARY OF ONCOLOGIC HISTORY:   Cancer of central portion of left female breast   08/20/2013 Initial Diagnosis Left breast 12:00: DCIS with calcifications, yet 100%, PR 100%; 09/04/2013 second group of calcifications also biopsied proven to be DCIS ER/PR positive   09/19/2013 Surgery Left breast lumpectomy DCIS 2 foci margins negative, 0.2 and 0.3 cm ER 100% PR 100%   10/08/2013 - 11/12/2013 Radiation Therapy Adjuvant radiation therapy   11/12/2013 -  Anti-estrogen oral therapy Anastrozole 1 mg daily plan is for 5 years   01/09/2014 - 01/10/2014 Hospital Admission Pipeline embolization RICA aneurysm in the brain    CHIEF COMPLIANT: follow-up on anastrozole  INTERVAL HISTORY: Briana Ochoa is a 74 year old with above-mentioned history of left breast cancer currently on oral antiestrogen therapy with anastrozole. She is tolerating it extremely well. Initially she had hot flashes which have gotten better. She has not been exercising or doing any physical activity. In December 2015 she had an episode of mini stroke. Since that have neurologist has been recommending her to get on an exercise program. She is not started. But she plans to go to [facility] and get on tai chi today and-yoga and slowly begin an exercise regimen.  REVIEW OF SYSTEMS:   Constitutional: Denies fevers, chills or abnormal weight loss Eyes: Denies blurriness of vision Ears, nose, mouth, throat, and face: Denies mucositis or sore throat Respiratory: Denies  cough, dyspnea or wheezes Cardiovascular: Denies palpitation, chest discomfort or lower extremity swelling Gastrointestinal:  Denies nausea, heartburn or change in bowel habits Skin: Denies abnormal skin rashes Lymphatics: Denies new lymphadenopathy or easy bruising Neurological:Denies numbness, tingling or new weaknesses Behavioral/Psych: Mood is stable, no new changes  Breast:  denies any pain or lumps or nodules in either breasts All other systems were reviewed with the patient and are negative.  I have reviewed the past medical history, past surgical history, social history and family history with the patient and they are unchanged from previous note.  ALLERGIES:  has No Known Allergies.  MEDICATIONS:  Current Outpatient Prescriptions  Medication Sig Dispense Refill  . acetaminophen (TYLENOL) 325 MG tablet Take 650 mg by mouth daily as needed (pain).    [name] anastrozole (ARIMIDEX) 1 MG tablet Take 1 tablet (1 mg total) by mouth daily. 90 tablet 6  . aspirin 325 MG tablet Take 325 mg by mouth daily.    [name] atorvastatin (LIPITOR) 20 MG tablet Take 1 tablet (20 mg total) by mouth daily at 6 PM. 30 tablet 3  . clopidogrel (PLAVIX) 75 MG tablet Take 75 mg by mouth daily.     . Hypromellose (ARTIFICIAL TEARS OP) Apply 1 drop to eye daily as needed (dry eyes).     [name] lisinopril (PRINIVIL,ZESTRIL) 20 MG tablet TAKE 1 TABLET BY MOUTH DAILY. 90 tablet 2  . ibuprofen (ADVIL,MOTRIN) 200 MG tablet Take 400 mg by mouth daily as needed (pain).    . meclizine (ANTIVERT) 12.5 MG tablet Take 12.5 mg by mouth 3 (  three) times daily as needed for dizziness.    . ondansetron (ZOFRAN-ODT) 4 MG disintegrating tablet Take 4 mg by mouth every 8 (eight) hours as needed for nausea or vomiting.    . traMADol (ULTRAM) 50 MG tablet Take 1 tablet (50 mg total) by mouth every 6 (six) hours as needed. (Patient not taking: Reported on 10/14/2014) 50 tablet 2   No current facility-administered medications for this visit.     PHYSICAL EXAMINATION: ECOG PERFORMANCE STATUS: 0 - Asymptomatic  Filed Vitals:   10/14/14 1131  BP: 137/78  Pulse: 83  Temp: 97.5 F (36.4 C)  Resp: 18   Filed Weights   10/14/14 1131  Weight: 194 lb 9.6 oz (88.27 kg)    GENERAL:alert, no distress and comfortable SKIN: skin color, texture, turgor are normal, no rashes or significant lesions EYES: normal, Conjunctiva are pink and non-injected, sclera clear OROPHARYNX:no exudate, no erythema and lips, buccal mucosa, and tongue normal  NECK: supple, thyroid normal size, non-tender, without nodularity LYMPH:  no palpable lymphadenopathy in the cervical, axillary or inguinal LUNGS: clear to auscultation and percussion with normal breathing effort HEART: regular rate & rhythm and no murmurs and no lower extremity edema ABDOMEN:abdomen soft, non-tender and normal bowel sounds Musculoskeletal:no cyanosis of digits and no clubbing  NEURO: alert & oriented x 3 with fluent speech, no focal motor/sensory deficits BREAST: No palpable masses or nodules in either right or left breasts. No palpable axillary supraclavicular or infraclavicular adenopathy no breast tenderness or nipple discharge. (exam performed in the presence of a chaperone)  LABORATORY DATA:  I have reviewed the data as listed   Chemistry      Component Value Date/Time   NA 139 08/06/2014 0605   K 4.5 08/06/2014 0605   CL 105 08/06/2014 0605   CO2 25 08/06/2014 0605   BUN 16 08/06/2014 0605   CREATININE 1.28* 08/06/2014 0605      Component Value Date/Time   CALCIUM 8.9 08/06/2014 0605   ALKPHOS 92 09/17/2013 0926   AST 18 09/17/2013 0926   ALT 12 09/17/2013 0926   BILITOT 0.3 09/17/2013 0926       Lab Results  Component Value Date   WBC 8.6 08/06/2014   HGB 14.7 08/06/2014   HCT 44.1 08/06/2014   MCV 90.7 08/06/2014   PLT 294 08/06/2014   NEUTROABS 6.6 07/22/2014   ASSESSMENT & PLAN:  Cancer of central portion of left female breast DCIS left breast  ER 100%, P 100% diagnosis 08/20/2013 status post lumpectomy 09/19/2013; 2 foci measuring 0.2 and 0.3 cm, status post adjuvant radiation therapy, currently on Arimidex since October 2015  Arimidex toxicities: 1. Patient denies any hot flashes or myalgias 2. Bone density March 2016: T score -1.5  Breast cancer surveillance: 1. Breast exam 04/10/2014 is normal 2. Mammograms June 2016: Breast density category B, normal  Survivorship: I discussed the importance of physical exercise and she will start tai chi today. I discussed the importance of eating right losing weight staying healthy and active.  Return to clinic in 6 months for follow-up    No orders of the defined types were placed in this encounter.   The patient has a good understanding of the overall plan. she agrees with it. she will call with any problems that may develop before the next visit here.   Rulon Eisenmenger, MD

## 2014-10-14 NOTE — Assessment & Plan Note (Signed)
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 Patient denies any hot flashes or myalgias 2. Bone density March 2016: T score -1.5  Breast cancer surveillance: 1. Breast exam 04/10/2014 is normal 2. Mammograms June 2016: Breast density category B, normal  Survivorship: I discussed the importance of physical exercise and she will start tai chi today. I discussed the importance of eating right losing weight staying healthy and active.  Return to clinic in 6 months for follow-up

## 2014-10-28 ENCOUNTER — Ambulatory Visit (INDEPENDENT_AMBULATORY_CARE_PROVIDER_SITE_OTHER): Payer: Commercial Managed Care - HMO | Admitting: Family Medicine

## 2014-10-28 ENCOUNTER — Encounter: Payer: Self-pay | Admitting: Family Medicine

## 2014-10-28 VITALS — BP 136/82 | HR 85 | Temp 98.7°F | Ht 64.0 in | Wt 193.8 lb

## 2014-10-28 DIAGNOSIS — E089 Diabetes mellitus due to underlying condition without complications: Secondary | ICD-10-CM

## 2014-10-28 DIAGNOSIS — I10 Essential (primary) hypertension: Secondary | ICD-10-CM

## 2014-10-28 DIAGNOSIS — I739 Peripheral vascular disease, unspecified: Secondary | ICD-10-CM | POA: Diagnosis not present

## 2014-10-28 DIAGNOSIS — Z1211 Encounter for screening for malignant neoplasm of colon: Secondary | ICD-10-CM

## 2014-10-28 DIAGNOSIS — Z23 Encounter for immunization: Secondary | ICD-10-CM

## 2014-10-28 DIAGNOSIS — N183 Chronic kidney disease, stage 3 unspecified: Secondary | ICD-10-CM

## 2014-10-28 DIAGNOSIS — I671 Cerebral aneurysm, nonruptured: Secondary | ICD-10-CM

## 2014-10-28 DIAGNOSIS — I6330 Cerebral infarction due to thrombosis of unspecified cerebral artery: Secondary | ICD-10-CM

## 2014-10-28 DIAGNOSIS — Z0000 Encounter for general adult medical examination without abnormal findings: Secondary | ICD-10-CM | POA: Diagnosis not present

## 2014-10-28 DIAGNOSIS — C50112 Malignant neoplasm of central portion of left female breast: Secondary | ICD-10-CM

## 2014-10-28 LAB — COMPREHENSIVE METABOLIC PANEL
ALT: 11 U/L (ref 0–35)
AST: 16 U/L (ref 0–37)
Albumin: 4.1 g/dL (ref 3.5–5.2)
Alkaline Phosphatase: 82 U/L (ref 39–117)
BUN: 21 mg/dL (ref 6–23)
CO2: 29 mEq/L (ref 19–32)
Calcium: 10.2 mg/dL (ref 8.4–10.5)
Chloride: 103 mEq/L (ref 96–112)
Creatinine, Ser: 1.39 mg/dL — ABNORMAL HIGH (ref 0.40–1.20)
GFR: 39.35 mL/min — ABNORMAL LOW (ref >60.00)
Glucose, Bld: 86 mg/dL (ref 70–99)
Potassium: 4.7 mEq/L (ref 3.5–5.1)
Sodium: 140 mEq/L (ref 135–145)
Total Bilirubin: 0.4 mg/dL (ref 0.2–1.2)
Total Protein: 7.4 g/dL (ref 6.0–8.3)

## 2014-10-28 LAB — CBC WITH DIFFERENTIAL/PLATELET
Basophils Absolute: 0.1 10*3/uL (ref 0.0–0.1)
Basophils Relative: 0.5 % (ref 0.0–3.0)
Eosinophils Absolute: 0.4 10*3/uL (ref 0.0–0.7)
Eosinophils Relative: 3.4 % (ref 0.0–5.0)
HCT: 47.3 % — ABNORMAL HIGH (ref 36.0–46.0)
Hemoglobin: 16 g/dL — ABNORMAL HIGH (ref 12.0–15.0)
Lymphocytes Relative: 17.9 % (ref 12.0–46.0)
Lymphs Abs: 2.2 10*3/uL (ref 0.7–4.0)
MCHC: 33.8 g/dL (ref 30.0–36.0)
MCV: 90.3 fl (ref 78.0–100.0)
Monocytes Absolute: 1.1 10*3/uL — ABNORMAL HIGH (ref 0.1–1.0)
Monocytes Relative: 9.2 % (ref 3.0–12.0)
Neutro Abs: 8.3 10*3/uL — ABNORMAL HIGH (ref 1.4–7.7)
Neutrophils Relative %: 69 % (ref 43.0–77.0)
Platelets: 304 10*3/uL (ref 150.0–400.0)
RBC: 5.24 Mil/uL — ABNORMAL HIGH (ref 3.87–5.11)
RDW: 14.1 % (ref 11.5–15.5)
WBC: 12 10*3/uL — ABNORMAL HIGH (ref 4.0–10.5)

## 2014-10-28 LAB — TSH: TSH: 1.79 u[IU]/mL (ref 0.35–4.50)

## 2014-10-28 LAB — HEMOGLOBIN A1C: Hgb A1c MFr Bld: 6.5 % (ref 4.6–6.5)

## 2014-10-28 LAB — MICROALBUMIN / CREATININE URINE RATIO
Creatinine,U: 79.9 mg/dL
Microalb Creat Ratio: 1 mg/g (ref 0.0–30.0)
Microalb, Ur: 0.8 mg/dL (ref 0.0–1.9)

## 2014-10-28 NOTE — Assessment & Plan Note (Signed)
Diet controlled. Due for labs today. 

## 2014-10-28 NOTE — Addendum Note (Signed)
Addended by: Ellamae Sia on: 10/28/2014 02:07 PM   Modules accepted: Orders

## 2014-10-28 NOTE — Assessment & Plan Note (Signed)
Creatinine has been stable.  Repeat labs today.

## 2014-10-28 NOTE — Assessment & Plan Note (Signed)
Followed by neuro. On ASA and plavix for secondary stroke prevention. No changes made today.

## 2014-10-28 NOTE — Addendum Note (Signed)
Addended by: Modena Nunnery on: 10/28/2014 12:38 PM   Modules accepted: Orders

## 2014-10-28 NOTE — Assessment & Plan Note (Signed)
Well controlled on current rx. No changes made today. 

## 2014-10-28 NOTE — Assessment & Plan Note (Addendum)
Discussed diet and exercise plan today. She does plan to walk more. Declines nutritionist referral.

## 2014-10-28 NOTE — Assessment & Plan Note (Signed)
Followed by oncology. On arimidex. No changes made today.

## 2014-10-28 NOTE — Assessment & Plan Note (Signed)
The patients weight, height, BMI and visual acuity have been recorded in the chart.  Cognitive function assessed.   I have made referrals, counseling and provided education to the patient based review of the above and I have provided the pt with a written personalized care plan for preventive services.  Influenza vaccine and prevnar 13 given today. Declines zostavax and colonoscopy but she agrees to IFOB.  Orders Placed This Encounter  Procedures  . Fecal occult blood, imunochemical  . CBC with Differential/Platelet  . Comprehensive metabolic panel  . TSH  . Hemoglobin A1c  . Microalbumin / creatinine urine ratio

## 2014-10-28 NOTE — Progress Notes (Signed)
Subjective:   Patient ID: Briana Ochoa, female    DOB: [birth year], 74 y.o.   MRN: [id number]  Briana Ochoa is a pleasant 74 y.o. year old female who presents to clinic today with Annual Exam and follow up of chronic medical conditions on 10/28/2014   I have personally reviewed the Medicare Annual Wellness questionnaire and have noted 1. The patient's medical and social history 2. Their use of alcohol, tobacco or illicit drugs 3. Their current medications and supplements 4. The patient's functional ability including ADL's, fall risks, home safety risks and hearing or visual             impairment. 5. Diet and physical activities 6. Evidence for depression or mood disorders  End of life wishes discussed and updated in Social History.  The roster of all physicians providing medical care to patient - is listed in the CareTeams section of the chart.   Has never had a colonoscopy. Had pneumovax in late 60s per pt. Has never had the shingles vaccine. Tdap 07/17/12 DEXA 3/16- T schore -1.5 Mammogram 07/2014  Breast cancer- followed by Dr. Lindi Adie.  Last seen on 10/14/14- note reviewed. Remains on arimidex since 10/15  HTN- has been well controlled on on lisinopril 20 mg daily.  Denies any HA, blurred vision, CP or SOB.  No LE edema.  CKD III-  Lab Results  Component Value Date   CREATININE 1.28* 08/06/2014   Felt to be secondary to NSAIDs.  Has not seen renal since last year.  Cr has been stable.     DM- has been diet controlled for years.  Obesity- has lost a few pounds.  Working on walking more.  Wt Readings from Last 3 Encounters:  10/28/14 193 lb 12 oz (87.884 kg)  10/14/14 194 lb 9.6 oz (88.27 kg)  10/08/14 197 lb 3.2 oz (89.449 kg)   S/p stroke- 07/2014.  Was admitted for angiogram after stenting of right ICA aneurysm while developing stroke symptoms during procedure.  Followed by neurology, Dr. Leonie Man.  Last seen on 10/08/14- note reviewed. Advised to  continue ASA and plavix for stroke secondary prevention along with statin.  Still has residual numbness in her face but is getting "used to it."   Lab Results  Component Value Date   HGBA1C 6.3* 08/05/2014   Lab Results  Component Value Date   CHOL 173 08/06/2014   HDL 48 08/06/2014   LDLCALC 89 08/06/2014   TRIG 180* 08/06/2014   CHOLHDL 3.6 08/06/2014    Current Outpatient Prescriptions on File Prior to Visit  Medication Sig Dispense Refill  . acetaminophen (TYLENOL) 325 MG tablet Take 650 mg by mouth daily as needed (pain).    [name] anastrozole (ARIMIDEX) 1 MG tablet Take 1 tablet (1 mg total) by mouth daily. 90 tablet 6  . aspirin 325 MG tablet Take 325 mg by mouth daily.    [name] atorvastatin (LIPITOR) 20 MG tablet Take 1 tablet (20 mg total) by mouth daily at 6 PM. 30 tablet 3  . clopidogrel (PLAVIX) 75 MG tablet Take 75 mg by mouth daily.     . Hypromellose (ARTIFICIAL TEARS OP) Apply 1 drop to eye daily as needed (dry eyes).     [name] ibuprofen (ADVIL,MOTRIN) 200 MG tablet Take 400 mg by mouth daily as needed (pain).    [name] lisinopril (PRINIVIL,ZESTRIL) 20 MG tablet TAKE 1 TABLET BY MOUTH DAILY. 90 tablet 2  . meclizine (ANTIVERT) 12.5 MG tablet Take 12.5 mg by  mouth 3 (three) times daily as needed for dizziness.    . ondansetron (ZOFRAN-ODT) 4 MG disintegrating tablet Take 4 mg by mouth every 8 (eight) hours as needed for nausea or vomiting.     No current facility-administered medications on file prior to visit.    No Known Allergies  Past Medical History  Diagnosis Date  . Hypertension   . Vertigo   . Cancer     lt breast  . 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 Radiation 10/21/13-11/11/13    Left Breast  . Chronic kidney disease     renal insuffic, Dr. Jaci Carrel- [place]  . Complication of anesthesia 1970's    difficult time waking- felt like she couldn't breathe  . Heart murmur     Echo > 10 yrs. ago, in [place]   .  Diabetes mellitus without complication     diet controlled   . Arthritis     L knee, R shoulder  . Arthritis of knee     Past Surgical History  Procedure Laterality Date  . Cesarean section  1964  . Cholecystectomy    . Splenectomy, total    . Breast biopsy      Left  . Finger surgery      lt thumb  from dog bite  . Breast lumpectomy with needle localization Left 09/19/2013    Procedure: LEFT BREAST LUMPECTOMY WITH DOUBLE WIRE BRACKETED  NEEDLE LOCALIZATION;  Surgeon: [name], MD;  Location: [place];  Service: General;  Laterality: Left;  . Tonsillectomy    . Multiple tooth extractions      top palate- wears full plate  . Radiology with anesthesia N/A 01/09/2014    Procedure: Embolization;  Surgeon: Consuella Lose, MD;  Location: [place];  Service: Radiology;  Laterality: N/A;    Family History  Problem Relation Age of Onset  . Cancer Mother     kidney  . Hypertension Mother   . Heart attack Paternal Grandfather   . Breast cancer Maternal Aunt 80  . Breast cancer Paternal Aunt 54    Social History   Social History  . Marital Status: Widowed    Spouse Name: N/A  . Number of Children: N/A  . Years of Education: N/A   Occupational History  .      Retired   Social History Main Topics  . Smoking status: Former Smoker -- 1.50 packs/day    Types: Cigarettes    Quit date: 03/06/2003  . Smokeless tobacco: Never Used  . Alcohol Use: No  . Drug Use: No  . Sexual Activity: Not on file   Other Topics Concern  . Not on file   Social History Narrative   Widowed 2013   5 children   17 grandchildren      Does not have a living will-desires CPR, does not want prolonged life support if futile.   The PMH, PSH, Social History, Family History, Medications, and allergies have been reviewed in [facility], and have been updated if relevant.  Review of Systems  Constitutional: Negative.   HENT: Negative.   Eyes: Negative.   Respiratory: Negative.   Cardiovascular:  Negative.   Gastrointestinal: Negative.   Endocrine: Negative.   Genitourinary: Negative.   Musculoskeletal: Negative.   Skin: Negative.   Allergic/Immunologic: Negative.   Neurological: Negative.   Hematological: Negative.   Psychiatric/Behavioral: Negative.   All other systems reviewed and are negative.  Objective:    BP 136/82 mmHg  Pulse 85  Temp(Src) 98.7 F (37.1 C) (Oral)  Ht 5\' 4"  (1.626 m)  Wt 193 lb 12 oz (87.884 kg)  BMI 33.24 kg/m2  SpO2 95%   Physical Exam   General:  Well-developed,well-nourished,in no acute distress; alert,appropriate and cooperative throughout examination Head:  normocephalic and atraumatic.   Eyes:  vision grossly intact, pupils equal, pupils round, and pupils reactive to light.   Ears:  R ear normal and L ear normal.   Nose:  no external deformity.   Mouth:  good dentition.   Neck:  No deformities, masses, or tenderness noted. Breasts:  No mass, nodules, thickening, tenderness, bulging, retraction, inflamation, nipple discharge or skin changes noted.   Lungs:  Normal respiratory effort, chest expands symmetrically. Lungs are clear to auscultation, no crackles or wheezes. Heart:  Normal rate and regular rhythm. S1 and S2 normal without gallop, murmur, click, rub or other extra sounds. Abdomen:  Bowel sounds positive,abdomen soft and non-tender without masses, organomegaly or hernias noted. Msk:  No deformity or scoliosis noted of thoracic or lumbar spine.   Extremities:  No clubbing, cyanosis, edema, or deformity noted with normal full range of motion of all joints.   Neurologic:  alert & oriented X3 and gait normal.   Skin:  Intact without suspicious lesions or rashes Cervical Nodes:  No lymphadenopathy noted Axillary Nodes:  No palpable lymphadenopathy Psych:  Cognition and judgment appear intact. Alert and cooperative with normal attention span and concentration. No apparent delusions, illusions, hallucinations     Assessment &  Plan:   Right internal carotid artery aneurysm  Morbid obesity  CKD (chronic kidney disease) stage 3, GFR 30-59 ml/min  Medicare annual wellness visit, subsequent  Diabetes mellitus due to underlying condition without complications  Essential hypertension No Follow-up on file.

## 2014-10-29 ENCOUNTER — Encounter: Payer: Self-pay | Admitting: Family Medicine

## 2014-12-01 DIAGNOSIS — I739 Peripheral vascular disease, unspecified: Secondary | ICD-10-CM | POA: Insufficient documentation

## 2014-12-01 NOTE — Addendum Note (Signed)
Addended by: Lucille Passy on: 12/01/2014 08:03 AM   Modules accepted: Miquel Dunn

## 2014-12-12 ENCOUNTER — Encounter: Payer: Self-pay | Admitting: Primary Care

## 2014-12-12 ENCOUNTER — Ambulatory Visit (INDEPENDENT_AMBULATORY_CARE_PROVIDER_SITE_OTHER): Payer: Commercial Managed Care - HMO | Admitting: Primary Care

## 2014-12-12 VITALS — BP 130/84 | HR 81 | Temp 97.7°F | Ht 64.0 in | Wt 200.8 lb

## 2014-12-12 DIAGNOSIS — L259 Unspecified contact dermatitis, unspecified cause: Secondary | ICD-10-CM | POA: Diagnosis not present

## 2014-12-12 MED ORDER — PREDNISONE 10 MG PO TABS: ORAL_TABLET | ORAL | Status: DC

## 2014-12-12 NOTE — Patient Instructions (Signed)
Start Prednisone tablets for rash. Take 3 tablets daily for 3 days, then 2 tablets daily for 3 days, then 1 tablet daily for 3 days.  You may continue using the hydrocortisone cream twice daily as need for itching.   If the itching bothers you at night, you may take 1 benadryl tablet.  Please notify me if no improvement.  It was a pleasure meeting you!  Contact Dermatitis Dermatitis is redness, soreness, and swelling (inflammation) of the skin. Contact dermatitis is a reaction to certain substances that touch the skin. There are two types of contact dermatitis:   Irritant contact dermatitis. This type is caused by something that irritates your skin, such as dry hands from washing them too much. This type does not require previous exposure to the substance for a reaction to occur. This type is more common.  Allergic contact dermatitis. This type is caused by a substance that you are allergic to, such as a nickel allergy or poison ivy. This type only occurs if you have been exposed to the substance (allergen) before. Upon a repeat exposure, your body reacts to the substance. This type is less common. CAUSES  Many different substances can cause contact dermatitis. Irritant contact dermatitis is most commonly caused by exposure to:   Makeup.   Soaps.   Detergents.   Bleaches.   Acids.   Metal salts, such as nickel.  Allergic contact dermatitis is most commonly caused by exposure to:   Poisonous plants.   Chemicals.   Jewelry.   Latex.   Medicines.   Preservatives in products, such as clothing.  RISK FACTORS This condition is more likely to develop in:   People who have jobs that expose them to irritants or allergens.  People who have certain medical conditions, such as asthma or eczema.  SYMPTOMS  Symptoms of this condition may occur anywhere on your body where the irritant has touched you or is touched by you. Symptoms include:  Dryness or flaking.    Redness.   Cracks.   Itching.   Pain or a burning feeling.   Blisters.  Drainage of small amounts of blood or clear fluid from skin cracks. With allergic contact dermatitis, there may also be swelling in areas such as the eyelids, mouth, or genitals.  DIAGNOSIS  This condition is diagnosed with a medical history and physical exam. A patch skin test may be performed to help determine the cause. If the condition is related to your job, you may need to see an occupational medicine specialist. TREATMENT Treatment for this condition includes figuring out what caused the reaction and protecting your skin from further contact. Treatment may also include:   Steroid creams or ointments. Oral steroid medicines may be needed in more severe cases.  Antibiotics or antibacterial ointments, if a skin infection is present.  Antihistamine lotion or an antihistamine taken by mouth to ease itching.  A bandage (dressing). HOME CARE INSTRUCTIONS Skin Care  Moisturize your skin as needed.   Apply cool compresses to the affected areas.  Try taking a bath with:  Epsom salts. Follow the instructions on the packaging. You can get these at your local pharmacy or grocery store.  Baking soda. Pour a small amount into the bath as directed by your health care provider.  Colloidal oatmeal. Follow the instructions on the packaging. You can get this at your local pharmacy or grocery store.  Try applying baking soda paste to your skin. Stir water into baking soda until it reaches  a paste-like consistency.  Do not scratch your skin.  Bathe less frequently, such as every other day.  Bathe in lukewarm water. Avoid using hot water. Medicines  Take or apply over-the-counter and prescription medicines only as told by your health care provider.   If you were prescribed an antibiotic medicine, take or apply your antibiotic as told by your health care provider. Do not stop using the antibiotic  even if your condition starts to improve. General Instructions  Keep all follow-up visits as told by your health care provider. This is important.  Avoid the substance that caused your reaction. If you do not know what caused it, keep a journal to try to track what caused it. Write down:  What you eat.  What cosmetic products you use.  What you drink.  What you wear in the affected area. This includes jewelry.  If you were given a dressing, take care of it as told by your health care provider. This includes when to change and remove it. SEEK MEDICAL CARE IF:   Your condition does not improve with treatment.  Your condition gets worse.  You have signs of infection such as swelling, tenderness, redness, soreness, or warmth in the affected area.  You have a fever.  You have new symptoms. SEEK IMMEDIATE MEDICAL CARE IF:   You have a severe headache, neck pain, or neck stiffness.  You vomit.  You feel very sleepy.  You notice red streaks coming from the affected area.  Your bone or joint underneath the affected area becomes painful after the skin has healed.  The affected area turns darker.  You have difficulty breathing.   This information is not intended to replace advice given to you by your health care provider. Make sure you discuss any questions you have with your health care provider.   Document Released: 01/22/2000 Document Revised: 10/15/2014 Document Reviewed: 06/11/2014 Elsevier Interactive Patient Education [employer].

## 2014-12-12 NOTE — Progress Notes (Signed)
Pre visit review using our clinic review tool, if applicable. No additional management support is needed unless otherwise documented below in the visit note. 

## 2014-12-12 NOTE — Progress Notes (Signed)
Subjective:    Patient ID: Briana Ochoa, female    DOB: [birth year], 74 y.o.   MRN: [id number]  HPI  Ms. Salazar is a 74 year old female who presents today with a chief complaint of rash. Her rash is located to the right upper extremity in the cubital fossa with spread up to her right upper extremity gradually over the last several days. She's also noticed the rash starting to her left upper extremity in the crease. She first noticed her rash on Sunday 10/23. She was outside all day on 10/22 and on 10/23 on her son's farm. She denies being in brush or amongst trees.  She reports itching. Denies pain. She's used hydrocortisone cream OTC with improvement in itching but without improvement of rash. Denies fevers, chills, tick bites.  Review of Systems  Constitutional: Negative for fever and chills.  Musculoskeletal: Negative for myalgias.  Skin: Positive for color change and rash.  Neurological: Negative for headaches.       Past Medical History  Diagnosis Date  . Hypertension   . Vertigo   . Cancer [facility])     lt breast  . 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 Radiation 10/21/13-11/11/13    Left Breast  . Chronic kidney disease     renal insuffic, Dr. Jaci Carrel- [place]  . Complication of anesthesia 1970's    difficult time waking- felt like she couldn't breathe  . Heart murmur     Echo > 10 yrs. ago, [place]   . Diabetes mellitus without complication ([place])     diet controlled   . Arthritis     L knee, R shoulder  . Arthritis of knee     Social History   Social History  . Marital Status: Widowed    Spouse Name: N/A  . Number of Children: N/A  . Years of Education: N/A   Occupational History  .      Retired   Social History Main Topics  . Smoking status: Former Smoker -- 1.50 packs/day    Types: Cigarettes    Quit date: 03/06/2003  . Smokeless tobacco: Never Used  . Alcohol Use: No  . Drug Use: No  .  Sexual Activity: Not on file   Other Topics Concern  . Not on file   Social History Narrative   Widowed 2013   5 children   17 grandchildren      Does not have a living will-desires CPR, does not want prolonged life support if futile.    Past Surgical History  Procedure Laterality Date  . Cesarean section  1964  . Cholecystectomy    . Splenectomy, total    . Breast biopsy      Left  . Finger surgery      lt thumb  from dog bite  . Breast lumpectomy with needle localization Left 09/19/2013    Procedure: LEFT BREAST LUMPECTOMY WITH DOUBLE WIRE BRACKETED  NEEDLE LOCALIZATION;  Surgeon: [name], MD;  Location: [place];  Service: General;  Laterality: Left;  . Tonsillectomy    . Multiple tooth extractions      top palate- wears full plate  . Radiology with anesthesia N/A 01/09/2014    Procedure: Embolization;  Surgeon: Consuella Lose, MD;  Location: [place];  Service: Radiology;  Laterality: N/A;    Family History  Problem Relation Age of Onset  . Cancer Mother     kidney  .  Hypertension Mother   . Heart attack Paternal Grandfather   . Breast cancer Maternal Aunt 80  . Breast cancer Paternal Aunt 76    No Known Allergies  Current Outpatient Prescriptions on File Prior to Visit  Medication Sig Dispense Refill  . acetaminophen (TYLENOL) 325 MG tablet Take 650 mg by mouth daily as needed (pain).    [name] anastrozole (ARIMIDEX) 1 MG tablet Take 1 tablet (1 mg total) by mouth daily. 90 tablet 6  . atorvastatin (LIPITOR) 20 MG tablet Take 1 tablet (20 mg total) by mouth daily at 6 PM. 30 tablet 3  . clopidogrel (PLAVIX) 75 MG tablet Take 75 mg by mouth daily.     . Hypromellose (ARTIFICIAL TEARS OP) Apply 1 drop to eye daily as needed (dry eyes).     [name] lisinopril (PRINIVIL,ZESTRIL) 20 MG tablet TAKE 1 TABLET BY MOUTH DAILY. 90 tablet 2  . meclizine (ANTIVERT) 12.5 MG tablet Take 12.5 mg by mouth 3 (three) times daily as needed for dizziness.    . ondansetron (ZOFRAN-ODT) 4  MG disintegrating tablet Take 4 mg by mouth every 8 (eight) hours as needed for nausea or vomiting.    [name] aspirin 325 MG tablet Take 325 mg by mouth daily.    [name] ibuprofen (ADVIL,MOTRIN) 200 MG tablet Take 400 mg by mouth daily as needed (pain).     No current facility-administered medications on file prior to visit.    BP 130/84 mmHg  Pulse 81  Temp(Src) 97.7 F (36.5 C) (Oral)  Ht 5\' 4"  (1.626 m)  Wt 200 lb 12.8 oz (91.082 kg)  BMI 34.45 kg/m2  SpO2 98%    Objective:   Physical Exam  Constitutional: She appears well-nourished.  Cardiovascular: Normal rate and regular rhythm.   Pulmonary/Chest: Effort normal and breath sounds normal.  Skin: Skin is warm and dry. Rash noted.  Moderate erythema to right cubital fossa with linear streaking up right medial upper extremity. Very mild rash to left cubital fossa. No skin breakdown. Appears to be contact dermatitis.          Assessment & Plan:  Contact Dermatitis:  Located mostly to right anterior cubital fossa with mild spread to right medial upper extremity. Suspect contact dermatitis from poison ivy/oak. Do not suspect eczema due to presentation and HPI. Will treat with prednisone taper. Continue OTC hydrocortisone PRN. (Diabetes well managed, discussed to watch diet) Discussed poison ivy and to clean clothes and linens well. Follow up PRN.

## 2014-12-16 NOTE — Patient Outreach (Signed)
Briana Ochoa) Care Management  12/16/2014  Briana Ochoa 11/16/1940 [id number]   Referral from [facility] tier 4 list, assigned Quinn Plowman, RN for patient outreach.  Trachelle Low L. Hogan Hoobler, [place] Care Management Assistant

## 2014-12-24 LAB — HM DIABETES EYE EXAM

## 2014-12-29 ENCOUNTER — Other Ambulatory Visit: Payer: Self-pay

## 2014-12-29 NOTE — Patient Outreach (Signed)
[facility]) Care Management  12/29/2014  Briana Ochoa 07-11-1940 [id number]   SUBJECTIVE: Telephone call to patient regarding Human high risk referral. HIPAA verified with patient.  Discussed and offered [facility] care management services to patient.  Patient declined services.  Patient verbally agreed to receive [facility] care management outreach letter and brochure for future needs.   ASSESSMENT: [employer] tier 4 referral.  PLAN: RNCM will refer patient to [place] to close due to refusal of services. RNCM will send patient outreach letter and brochure as agreed upon. RNCM will notify patient primary MD of closure due to refusal of services.   Quinn Plowman RN,BSN,CCM [place] Coordinator [phone]

## 2015-01-14 ENCOUNTER — Other Ambulatory Visit: Payer: Self-pay | Admitting: *Deleted

## 2015-01-14 MED ORDER — ATORVASTATIN CALCIUM 20 MG PO TABS: 20.0000 mg | ORAL_TABLET | Freq: Every day | ORAL | Status: DC

## 2015-01-30 ENCOUNTER — Other Ambulatory Visit: Payer: Self-pay | Admitting: Family Medicine

## 2015-03-10 ENCOUNTER — Other Ambulatory Visit: Payer: Self-pay | Admitting: Hematology and Oncology

## 2015-03-10 NOTE — Telephone Encounter (Signed)
Chart reviewed.

## 2015-04-07 ENCOUNTER — Ambulatory Visit (INDEPENDENT_AMBULATORY_CARE_PROVIDER_SITE_OTHER): Payer: Commercial Managed Care - HMO | Admitting: Neurology

## 2015-04-07 ENCOUNTER — Encounter: Payer: Self-pay | Admitting: Neurology

## 2015-04-07 VITALS — BP 144/81 | HR 79 | Ht 64.0 in | Wt 198.0 lb

## 2015-04-07 DIAGNOSIS — I6521 Occlusion and stenosis of right carotid artery: Secondary | ICD-10-CM | POA: Diagnosis not present

## 2015-04-07 MED ORDER — PRAVASTATIN SODIUM 20 MG PO TABS: 20.0000 mg | ORAL_TABLET | Freq: Every day | ORAL | Status: DC

## 2015-04-07 NOTE — Progress Notes (Signed)
[facility] [address] [place]. [place] [address] [phone]       OFFICE FOLLOW-UP NOTE  Briana Ochoa Date of Birth:  12/03/1940 Medical Record Number:  [id number]   HPI: Ms Briana Ochoa is a 42 year pleasant lady seen today for first office follow-up visit following hospital admission for stroke in June 2016.Briana Ochoa is an 75 y.o. female presenting to hospital for follow up angiogram after stenting of right ICA aneurysm. Pateitn did well during procedure but post procedure noted bright wavy lines in her visual field and then right facial numbness in forehead and right finger tips. Code stroke was called and CT obtained showing no acute bleed or infarct. (imaging reviewed) Due to minimal symptoms tPA was not administered. Cerebral catheter angiogram 08/05/14 showed severe stenosis with flow limitation of the proximal right ICA pipeline stent likely representing hyperendothelialization. MRI scan of the brain showed small bilateral medial inferior parietal infarcts greater on the right and possibly tiny acute right thalamic infarct. 9 mm calcified meningioma was noted in the left petrous apex. An enplaque small meningiomas noted in the medial right temporal region. LDL cholesterol was 89 mg percent and hemoglobin A1c was 6.3. Transesophageal echocardiogram showed normal ejection fraction without cardiac source of embolism. Patient did well and was seen by physical occupational therapy. She is currently home and doing well. She is tolerating aspirin and Plavix with only minor bruising but no bleeding episodes. She states her blood pressure is under good control and it was 141/83 today. She states her sugars have also been under good control. She has no new complaints today. Update 04/06/15: She returns for follow-up after last visit 6 month ago. She continues to do well without recurrent stroke or TIA symptoms. She still has some intermittent right fingertip  paresthesias but these are not disabling. She is tolerating aspirin and Plavix with minor bruising but no significant bleeding episodes. She states her blood pressure usually stays in [id number] systolic range and today it is 144/81. The patient discontinued Lipitor 2 weeks ago due to muscle aches and cramps. She has not tried any other statin yet. She is due for follow-up lipid profile in the spring during her annual physical visit with her primary care physician. She has no other complaints today. ROS:   14 system review of systems is positive for  eye itching and redness, easy bleeding, joint pain, bruising muscle cramps and all other systems negative  PMH:  Past Medical History  Diagnosis Date  . Hypertension   . Vertigo   . Cancer [facility])     lt breast  . 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 Radiation 10/21/13-11/11/13    Left Breast  . Chronic kidney disease     renal insuffic, Dr. Jaci Carrel- [place]  . Complication of anesthesia 1970's    difficult time waking- felt like she couldn't breathe  . Heart murmur     Echo > [place] yrs. ago[place]   . Diabetes mellitus without complication ([place])     diet controlled   . Arthritis     L knee, R shoulder  . Arthritis of knee   . Stroke [facility])     Social History:  Social History   Social History  . Marital Status: Widowed    Spouse Name: N/A  . Number of Children: N/A  . Years of Education: N/A   Occupational History  .  Retired   Social History Main Topics  . Smoking status: Former Smoker -- 1.50 packs/day    Types: Cigarettes    Quit date: 03/06/2003  . Smokeless tobacco: Never Used  . Alcohol Use: No  . Drug Use: No  . Sexual Activity: Not on file   Other Topics Concern  . Not on file   Social History Narrative   Widowed 2013   5 children   17 grandchildren      Does not have a living will-desires CPR, does not want prolonged life support if futile.     Medications:   Current Outpatient Prescriptions on File Prior to Visit  Medication Sig Dispense Refill  . acetaminophen (TYLENOL) 325 MG tablet Take 650 mg by mouth daily as needed (pain).    [name] anastrozole (ARIMIDEX) 1 MG tablet TAKE 1 TABLET BY MOUTH DAILY. 90 tablet 3  . aspirin 325 MG tablet Take 325 mg by mouth daily.    . clopidogrel (PLAVIX) 75 MG tablet Take 75 mg by mouth daily.     . Hypromellose (ARTIFICIAL TEARS OP) Apply 1 drop to eye daily as needed (dry eyes).     [name] lisinopril (PRINIVIL,ZESTRIL) 20 MG tablet TAKE 1 TABLET BY MOUTH DAILY. 90 tablet 3  . meclizine (ANTIVERT) 12.5 MG tablet Take 12.5 mg by mouth 3 (three) times daily as needed for dizziness.    . ondansetron (ZOFRAN-ODT) 4 MG disintegrating tablet Take 4 mg by mouth every 8 (eight) hours as needed for nausea or vomiting.     No current facility-administered medications on file prior to visit.    Allergies:  No Known Allergies  Physical Exam General: well developed, well nourished elderly Caucasian aldy, seated, in no evident distress Head: head normocephalic and atraumatic.  Neck: supple with no carotid or supraclavicular bruits Cardiovascular: regular rate and rhythm, no murmurs Musculoskeletal: no deformity Skin:  no rash. few petichiae on forearms. Vascular:  Normal pulses all extremities Filed Vitals:   04/07/15 1100  BP: 144/81  Pulse: 79   Neurologic Exam Mental Status: Awake and fully alert. Oriented to place and time. Recent and remote memory intact. Attention span, concentration and fund of knowledge appropriate. Mood and affect appropriate.  Cranial Nerves: Fundoscopic exam not done  . Pupils equal, briskly reactive to light. Extraocular movements full without nystagmus. Visual fields full to confrontation. Hearing intact. Facial sensation intact. Face, tongue, palate moves normally and symmetrically.  Motor: Normal bulk and tone. Normal strength in all tested extremity muscles. Sensory.:  intact to touch ,pinprick .position and vibratory sensation.  Coordination: Rapid alternating movements normal in all extremities. Finger-to-nose and heel-to-shin performed accurately bilaterally. Gait and Station: Arises from chair without difficulty. Stance is normal. Gait demonstrates normal stride length and balance . Able to heel, toe and tandem walk without difficulty.  Reflexes: 1+ and symmetric. Toes downgoing.   NIHSS  0 Modified Rankin  1   ASSESSMENT: 42 year caucasian lady with right face and hand numbness due to small left brainstem infarct not seen on MRI as well as silent bilateral right greater than left parieto-occipital cortical and subcortical infarcts and asymptomatic right internal capsule infarct following elective cerebral catheter angiogram which showed asymptomatic right ICA paraophthalmic thrombosed aneurysm with Intrastent stenosis in June 2016    PLAN: I had a long discussion with the patient and her husband regarding her intracranial right carotid stent stenosis and discuss stroke risk, risk reduction and answered questions. Continue aspirin and Plavix for stroke prevention with  strict control of blood pressure with goal below 130/90, lipids with LDL cholesterol goal below 70 mg percent and diabetes with hemoglobin A1c goal below 6.5%. I strongly encouraged her to take pravacol 20 mg along with coenzyme Q 10 200 mg daily to help with statin myalgias. If she is unable to tolerate this may consider changing over to the new PCS K9 inhibitors. Check follow-up carotid and Transcranial Doppler study. Return for follow-up in 6 months with nurse practitioner Jinny Blossom or call earlier if necessary   Antony Contras, MD  Note: This document was prepared with digital dictation and possible smart phrase technology. Any transcriptional errors that result from this process are unintentional

## 2015-04-07 NOTE — Patient Instructions (Signed)
I had a long discussion with the patient and her husband regarding her intracranial right carotid stent stenosis and discuss stroke risk, risk reduction and answered questions. Continue aspirin and Plavix for stroke prevention with strict control of blood pressure with goal below 130/90, lipids with LDL cholesterol goal below 70 mg percent and diabetes with hemoglobin A1c goal below 6.5%. I strongly encouraged her to take pravacol 20 mg along with coenzyme Q 10 200 mg daily to help with statin myalgias. If she is unable to tolerate this may consider changing over to the new PCS K9 inhibitors. Check follow-up carotid and Transcranial Doppler study. Return for follow-up in 6 months with nurse practitioner Briana Ochoa or call earlier if necessary

## 2015-04-15 ENCOUNTER — Ambulatory Visit (INDEPENDENT_AMBULATORY_CARE_PROVIDER_SITE_OTHER): Payer: Commercial Managed Care - HMO

## 2015-04-15 DIAGNOSIS — I6521 Occlusion and stenosis of right carotid artery: Secondary | ICD-10-CM | POA: Diagnosis not present

## 2015-04-21 ENCOUNTER — Ambulatory Visit (HOSPITAL_BASED_OUTPATIENT_CLINIC_OR_DEPARTMENT_OTHER): Payer: Commercial Managed Care - HMO | Admitting: Hematology and Oncology

## 2015-04-21 ENCOUNTER — Telehealth: Payer: Self-pay | Admitting: Hematology and Oncology

## 2015-04-21 ENCOUNTER — Encounter: Payer: Self-pay | Admitting: Hematology and Oncology

## 2015-04-21 VITALS — BP 130/71 | HR 89 | Temp 97.6°F | Resp 18 | Ht 64.0 in | Wt 197.1 lb

## 2015-04-21 DIAGNOSIS — C50112 Malignant neoplasm of central portion of left female breast: Secondary | ICD-10-CM

## 2015-04-21 NOTE — Telephone Encounter (Signed)
appt made and avs printed °

## 2015-04-21 NOTE — Assessment & Plan Note (Signed)
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 Patient denies any hot flashes or myalgias 2. Bone density March 2016: T score -1.5  Breast cancer surveillance: 1. Breast exam 04/21/2015 is normal 2. Mammograms June 2016: Breast density category B, normal  Survivorship: I discussed the importance of physical exercise and she will start tai chi today. I discussed the importance of eating right losing weight staying healthy and active.  Return to clinic in 6 months for follow-up after that we can see her once a year

## 2015-04-21 NOTE — Progress Notes (Signed)
Patient Care Team: Lucille Passy, MD as PCP - General (Family Medicine) Fanny Skates, MD as Consulting Physician (General Surgery) Thea Silversmith, MD as Consulting Physician (Radiation Oncology) Nicholas Lose, MD as Consulting Physician (Hematology and Oncology) Garvin Fila, MD as Consulting Physician (Neurology)  DIAGNOSIS: Cancer of central portion of left female breast [facility])   Staging form: Breast, AJCC 7th Edition     Clinical: Stage 0 (Tis (DCIS), N0, cM0) - Unsigned       Staging comments: Staged at breast conference 7.22.15      Pathologic: Stage 0 (Tis (DCIS)(2), N0, cM0) - Unsigned SUMMARY OF ONCOLOGIC HISTORY:   Cancer of central portion of left female breast ([place])   08/20/2013 Initial Diagnosis Left breast 12:00: DCIS with calcifications, yet 100%, PR 100%; 09/04/2013 second group of calcifications also biopsied proven to be DCIS ER/PR positive   09/19/2013 Surgery Left breast lumpectomy DCIS 2 foci margins negative, 0.2 and 0.3 cm ER 100% PR 100%   10/08/2013 - 11/12/2013 Radiation Therapy Adjuvant radiation therapy   11/12/2013 -  Anti-estrogen oral therapy Anastrozole 1 mg daily plan is for 5 years   01/09/2014 - 01/10/2014 Hospital Admission Pipeline embolization RICA aneurysm in the brain    CHIEF COMPLIANT: follow-up on anastrozole  INTERVAL HISTORY: Briana Ochoa is a 75 year old with above-mentioned history left breast cancer underwent lumpectomy followed by adjuvant radiation and is currently on anastrozole therapy. She has tolerating anastrozole extremely well. She denies any significant hot flashes. She's not been exercising because of left knee arthritis. She has a port in her backyard and plans to do water aerobics and swimming through this summer.  REVIEW OF SYSTEMS:   Constitutional: Denies fevers, chills or abnormal weight loss Eyes: Denies blurriness of vision Ears, nose, mouth, throat, and face: Denies mucositis or sore throat Respiratory: Denies  cough, dyspnea or wheezes Cardiovascular: Denies palpitation, chest discomfort Gastrointestinal:  Denies nausea, heartburn or change in bowel habits Skin: Denies abnormal skin rashes Lymphatics: Denies new lymphadenopathy or easy bruising Neurological:Denies numbness, tingling or new weaknesses Behavioral/Psych: Mood is stable, no new changes  Extremities: No lower extremity edema Breast:  denies any pain or lumps or nodules in either breasts All other systems were reviewed with the patient and are negative.  I have reviewed the past medical history, past surgical history, social history and family history with the patient and they are unchanged from previous note.  ALLERGIES:  has No Known Allergies.  MEDICATIONS:  Current Outpatient Prescriptions  Medication Sig Dispense Refill  . acetaminophen (TYLENOL) 325 MG tablet Take 650 mg by mouth daily as needed (pain).    [name] anastrozole (ARIMIDEX) 1 MG tablet TAKE 1 TABLET BY MOUTH DAILY. 90 tablet 3  . aspirin 325 MG tablet Take 325 mg by mouth daily.    . clopidogrel (PLAVIX) 75 MG tablet Take 75 mg by mouth daily.     . Hypromellose (ARTIFICIAL TEARS OP) Apply 1 drop to eye daily as needed (dry eyes).     [name] lisinopril (PRINIVIL,ZESTRIL) 20 MG tablet TAKE 1 TABLET BY MOUTH DAILY. 90 tablet 3  . meclizine (ANTIVERT) 12.5 MG tablet Take 12.5 mg by mouth 3 (three) times daily as needed for dizziness.    . ondansetron (ZOFRAN-ODT) 4 MG disintegrating tablet Take 4 mg by mouth every 8 (eight) hours as needed for nausea or vomiting.    . pravastatin (PRAVACHOL) 20 MG tablet Take 1 tablet (20 mg total) by mouth daily. 30 tablet 3  No current facility-administered medications for this visit.    PHYSICAL EXAMINATION: ECOG PERFORMANCE STATUS: 1 - Symptomatic but completely ambulatory  Filed Vitals:   04/21/15 1111  BP: 130/71  Pulse: 89  Temp: 97.6 F (36.4 C)  Resp: 18   Filed Weights   04/21/15 1111  Weight: 197 lb 1.6 oz (89.404 kg)     GENERAL:alert, no distress and comfortable SKIN: skin color, texture, turgor are normal, no rashes or significant lesions EYES: normal, Conjunctiva are pink and non-injected, sclera clear OROPHARYNX:no exudate, no erythema and lips, buccal mucosa, and tongue normal  NECK: supple, thyroid normal size, non-tender, without nodularity LYMPH:  no palpable lymphadenopathy in the cervical, axillary or inguinal LUNGS: clear to auscultation and percussion with normal breathing effort HEART: regular rate & rhythm and no murmurs and no lower extremity edema ABDOMEN:abdomen soft, non-tender and normal bowel sounds MUSCULOSKELETAL:no cyanosis of digits and no clubbing  NEURO: alert & oriented x 3 with fluent speech, no focal motor/sensory deficits EXTREMITIES: No lower extremity edema BREAST: No palpable masses or nodules in either right or left breasts. No palpable axillary supraclavicular or infraclavicular adenopathy no breast tenderness or nipple discharge. (exam performed in the presence of a chaperone)  LABORATORY DATA:  I have reviewed the data as listed   Chemistry      Component Value Date/Time   NA 140 10/28/2014 1245   K 4.7 10/28/2014 1245   CL 103 10/28/2014 1245   CO2 29 10/28/2014 1245   BUN 21 10/28/2014 1245   CREATININE 1.39* 10/28/2014 1245      Component Value Date/Time   CALCIUM 10.2 10/28/2014 1245   ALKPHOS 82 10/28/2014 1245   AST 16 10/28/2014 1245   ALT 11 10/28/2014 1245   BILITOT 0.4 10/28/2014 1245       Lab Results  Component Value Date   WBC 12.0* 10/28/2014   HGB 16.0* 10/28/2014   HCT 47.3* 10/28/2014   MCV 90.3 10/28/2014   PLT 304.0 10/28/2014   NEUTROABS 8.3* 10/28/2014   ASSESSMENT & PLAN:  Cancer of central portion of left female breast 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 Patient denies any hot flashes or myalgias 2. Bone density March 2016: T score -1.5  Breast cancer surveillance: 1. Breast exam 04/21/2015 is normal 2. Mammograms June 2016: Breast density category B, normal  Survivorship: I discussed the importance of physical exercise and she will plan on doing water aerobics and her swimming pool.. I discussed the importance of eating right losing weight staying healthy and active.  Return to clinic in 8 months for follow-up after that we can see her once a year   No orders of the defined types were placed in this encounter.   The patient has a good understanding of the overall plan. she agrees with it. she will call with any problems that may develop before the next visit here.   Rulon Eisenmenger, MD 04/21/2015

## 2015-06-08 HISTORY — PX: CATARACT EXTRACTION W/ INTRAOCULAR LENS  IMPLANT, BILATERAL: SHX1307

## 2015-07-06 ENCOUNTER — Other Ambulatory Visit: Payer: Self-pay | Admitting: Neurology

## 2015-08-07 ENCOUNTER — Other Ambulatory Visit: Payer: Self-pay | Admitting: General Surgery

## 2015-08-07 DIAGNOSIS — Z9889 Other specified postprocedural states: Secondary | ICD-10-CM

## 2015-08-21 ENCOUNTER — Ambulatory Visit
Admission: RE | Admit: 2015-08-21 | Discharge: 2015-08-21 | Disposition: A | Discharge status: Home / Self Care | Payer: Commercial Managed Care - HMO | Source: Ambulatory Visit | Attending: General Surgery | Admitting: General Surgery

## 2015-08-21 DIAGNOSIS — Z9889 Other specified postprocedural states: Secondary | ICD-10-CM

## 2015-10-05 ENCOUNTER — Ambulatory Visit: Payer: Commercial Managed Care - HMO | Admitting: Adult Health

## 2015-10-26 ENCOUNTER — Other Ambulatory Visit: Payer: Self-pay | Admitting: Family Medicine

## 2015-10-26 DIAGNOSIS — E08638 Diabetes mellitus due to underlying condition with other oral complications: Secondary | ICD-10-CM

## 2015-10-26 DIAGNOSIS — Z0000 Encounter for general adult medical examination without abnormal findings: Secondary | ICD-10-CM

## 2015-10-26 DIAGNOSIS — I10 Essential (primary) hypertension: Secondary | ICD-10-CM

## 2015-10-29 ENCOUNTER — Ambulatory Visit (INDEPENDENT_AMBULATORY_CARE_PROVIDER_SITE_OTHER): Payer: Commercial Managed Care - HMO

## 2015-10-29 ENCOUNTER — Other Ambulatory Visit (INDEPENDENT_AMBULATORY_CARE_PROVIDER_SITE_OTHER): Payer: Commercial Managed Care - HMO

## 2015-10-29 VITALS — BP 122/70 | HR 74 | Temp 98.0°F | Ht 64.0 in | Wt 200.2 lb

## 2015-10-29 DIAGNOSIS — Z23 Encounter for immunization: Secondary | ICD-10-CM | POA: Diagnosis not present

## 2015-10-29 DIAGNOSIS — Z0000 Encounter for general adult medical examination without abnormal findings: Secondary | ICD-10-CM | POA: Diagnosis not present

## 2015-10-29 DIAGNOSIS — E08638 Diabetes mellitus due to underlying condition with other oral complications: Secondary | ICD-10-CM | POA: Diagnosis not present

## 2015-10-29 LAB — COMPREHENSIVE METABOLIC PANEL
ALT: 12 U/L (ref 0–35)
AST: 15 U/L (ref 0–37)
Albumin: 3.8 g/dL (ref 3.5–5.2)
Alkaline Phosphatase: 75 U/L (ref 39–117)
BUN: 20 mg/dL (ref 6–23)
CO2: 30 mEq/L (ref 19–32)
Calcium: 9.5 mg/dL (ref 8.4–10.5)
Chloride: 106 mEq/L (ref 96–112)
Creatinine, Ser: 1.49 mg/dL — ABNORMAL HIGH (ref 0.40–1.20)
GFR: 36.22 mL/min — ABNORMAL LOW (ref >60.00)
Glucose, Bld: 130 mg/dL — ABNORMAL HIGH (ref 70–99)
Potassium: 4.7 mEq/L (ref 3.5–5.1)
Sodium: 143 mEq/L (ref 135–145)
Total Bilirubin: 0.4 mg/dL (ref 0.2–1.2)
Total Protein: 7.1 g/dL (ref 6.0–8.3)

## 2015-10-29 LAB — TSH: TSH: 3.69 u[IU]/mL (ref 0.35–4.50)

## 2015-10-29 LAB — LIPID PANEL
Cholesterol: 140 mg/dL (ref 0–200)
HDL: 59.9 mg/dL (ref >39.00)
LDL Cholesterol: 56 mg/dL (ref 0–99)
NonHDL: 80.33
Total CHOL/HDL Ratio: 2
Triglycerides: 122 mg/dL (ref 0.0–149.0)
VLDL: 24.4 mg/dL (ref 0.0–40.0)

## 2015-10-29 LAB — HEMOGLOBIN A1C: Hgb A1c MFr Bld: 7 % — ABNORMAL HIGH (ref 4.6–6.5)

## 2015-10-29 NOTE — Progress Notes (Signed)
PCP notes:   Health maintenance:  Flu vaccine - administered PPSV23 - administered Colon cancer screening - pt will discuss with PCP at CPE Tetanus - postponed/insurance Foot exam - pt will complete at CPE A1C - completed Shingles - postponed/insurance  Abnormal screenings:   Hearing - failed  Patient concerns:   None  Nurse concerns:  None  Next PCP appt:   11/04/15 @ 1315

## 2015-10-29 NOTE — Progress Notes (Signed)
Pre visit review using our clinic review tool, if applicable. No additional management support is needed unless otherwise documented below in the visit note. 

## 2015-10-29 NOTE — Progress Notes (Signed)
Subjective:   Briana Ochoa is a 75 y.o. female who presents for Medicare Annual (Subsequent) preventive examination.  Review of Systems:  N/A Cardiac Risk Factors include: advanced age (>73men, >53 women);diabetes mellitus;obesity (BMI >30kg/m2);dyslipidemia;hypertension     Objective:     Vitals: BP 122/70 (BP Location: Right Arm, Patient Position: Sitting, Cuff Size: Normal)   Pulse 74   Temp 98 F (36.7 C) (Oral)   Ht 5\' 4"  (1.626 m) Comment: no shoes  Wt 200 lb 4 oz (90.8 kg)   SpO2 93%   BMI 34.37 kg/m   Body mass index is 34.37 kg/m.   Tobacco History  Smoking Status  . Former Smoker  . Packs/day: 1.50  . Types: Cigarettes  . Quit date: 03/06/2003  Smokeless Tobacco  . Never Used     Counseling given: No   Past Medical History:  Diagnosis Date  . Arthritis    L knee, R shoulder  . Arthritis of knee   . Cancer (H[facility])    lt breast  . Chronic kidney disease    renal insuffic, Dr. Jaci Carrel-   . Complication of anesthesia 1970's   difficult time waking- felt like she couldn't breathe  . Diabetes mellitus without complication ([facility])    diet controlled   . Heart murmur    [place]rs[place]   . Hypertension   . 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 Radiation 10/21/13-11/11/13   Left Breast  . Stroke ([place])   . Vertigo    Past Surgical History:  Procedure Laterality Date  . BREAST BIOPSY     Left  . BREAST LUMPECTOMY WITH NEEDLE LOCALIZATION Left 09/19/2013   Procedure: LEFT BREAST LUMPECTOMY WITH DOUBLE WIRE BRACKETED  NEEDLE LOCALIZATION;  Surgeon: [name], MD;  Location: [facility];  Service: General;  Laterality: Left;  . CATARACT EXTRACTION, BILATERAL Bilateral 06/2015  . [place]  . CHOLECYSTECTOMY    . FINGER SURGERY     lt thumb  from dog bite  . MULTIPLE TOOTH EXTRACTIONS     top palate- wears full plate  . RADIOLOGY WITH ANESTHESIA N/A 01/09/2014    Procedure: Embolization;  Surgeon: Consuella Lose, MD;  Location: [facility];  Service: Radiology;  Laterality: N/A;  . SPLENECTOMY, TOTAL    . TONSILLECTOMY     Family History  Problem Relation Age of Onset  . Cancer Mother     kidney  . Hypertension Mother   . Heart attack Paternal Grandfather   . Breast cancer Maternal Aunt 80  . Breast cancer Paternal Aunt 15   History  Sexual Activity  . Sexual activity: No    Outpatient Encounter Prescriptions as of 10/29/2015  Medication Sig  . acetaminophen (TYLENOL) 325 MG tablet Take 650 mg by mouth daily as needed (pain).  [name] anastrozole (ARIMIDEX) 1 MG tablet TAKE 1 TABLET BY MOUTH DAILY.  [name] aspirin 325 MG tablet Take 325 mg by mouth daily.  . Hypromellose (ARTIFICIAL TEARS OP) Apply 1 drop to eye daily as needed (dry eyes).   [name] lisinopril (PRINIVIL,ZESTRIL) 20 MG tablet TAKE 1 TABLET BY MOUTH DAILY.  . meclizine (ANTIVERT) 12.5 MG tablet Take 12.5 mg by mouth 3 (three) times daily as needed for dizziness.  . ondansetron (ZOFRAN-ODT) 4 MG disintegrating tablet Take 4 mg by mouth every 8 (eight) hours as needed for nausea or vomiting.  . pravastatin (PRAVACHOL) 20 MG tablet  TAKE 1 TABLET (20 MG TOTAL) BY MOUTH DAILY.  . [DISCONTINUED] clopidogrel (PLAVIX) 75 MG tablet Take 75 mg by mouth daily.    No facility-administered encounter medications on file as of 10/29/2015.     Activities of Daily Living In your present state of health, do you have any difficulty performing the following activities: 10/29/2015  Hearing? Y  Vision? N  Difficulty concentrating or making decisions? N  Walking or climbing stairs? N  Dressing or bathing? N  Doing errands, shopping? N  Preparing Food and eating ? N  Using the Toilet? N  In the past six months, have you accidently leaked urine? Y  Do you have problems with loss of bowel control? N  Managing your Medications? N  Managing your Finances? N  Housekeeping or managing your Housekeeping? N  Some  recent data might be hidden    Patient Care Team: Lucille Passy, MD as PCP - General (Family Medicine) Fanny Skates, MD as Consulting Physician (General Surgery) Thea Silversmith, MD as Consulting Physician (Radiation Oncology) Nicholas Lose, MD as Consulting Physician (Hematology and Oncology) Garvin Fila, MD as Consulting Physician (Neurology) Consuella Lose, MD as Consulting Physician (Neurosurgery) Katy Apo, MD as Consulting Physician (Ophthalmology)    Assessment:     Hearing Screening   125Hz  250Hz  500Hz  1000Hz  2000Hz  3000Hz  4000Hz  6000Hz  8000Hz   Right ear:   0 0 0  0    Left ear:   0 0 0  0    Vision Screening Comments: Last vision exam in August 2017 with Dr. Prudencio Burly   Exercise Activities and Dietary recommendations Current Exercise Habits: The patient does not participate in regular exercise at present, Exercise limited by: orthopedic condition(s) (knee pain)  Goals    . Increase water intake          Starting 10/29/2015, I will continue to drink at least 6-8 glasses of water daily.      Fall Risk Fall Risk  10/29/2015 04/07/2015 10/28/2014 10/28/2014 12/10/2013  Falls in the past year? No No No No No   Depression Screen PHQ 2/9 Scores 10/29/2015 10/28/2014 10/28/2014 10/22/2013  PHQ - 2 Score 0 0 0 0     Cognitive Testing MMSE - Mini Mental State Exam 10/29/2015  Orientation to time 5  Orientation to Place 5  Registration 3  Attention/ Calculation 0  Recall 3  Language- name 2 objects 0  Language- repeat 1  Language- follow 3 step command 3  Language- read & follow direction 0  Write a sentence 0  Copy design 0  Total score 20   PLEASE NOTE: A Mini-Cog screen was completed. Maximum score is 20. A value of 0 denotes this part of [place] MMSE was not completed or the patient failed this part of the Mini-Cog screening.   Mini-Cog Screening Orientation to Time - Max 5 pts Orientation to Place - Max 5 pts Registration - Max 3 pts Recall - Max 3  pts Language Repeat - Max 1 pts Language Follow 3 Step Command - Max 3 pts  Immunization History  Administered Date(s) Administered  . DTaP 07/17/2012  . Influenza,inj,Quad PF,36+ Mos 02/22/2013, 12/17/2013, 10/29/2015  . Pneumococcal Conjugate-13 10/28/2014  . Pneumococcal Polysaccharide-23 10/29/2015   Screening Tests Health Maintenance  Topic Date Due  . FOOT EXAM  11/04/2015 (Originally 10/28/2015)  . COLONOSCOPY  10/28/2016 (Originally 06/12/1990)  . ZOSTAVAX  10/28/2016 (Originally 06/11/2000)  . TETANUS/TDAP  10/28/2016 (Originally 06/12/1959)  . OPHTHALMOLOGY EXAM  12/24/2015  .  HEMOGLOBIN A1C  04/27/2016  . INFLUENZA VACCINE  Completed  . DEXA SCAN  Completed  . PNA vac Low Risk Adult  Completed      Plan:     I have personally reviewed and addressed the Medicare Annual Wellness questionnaire and have noted the following in the patient's chart:  A. Medical and social history B. Use of alcohol, tobacco or illicit drugs  C. Current medications and supplements D. Functional ability and status E.  Nutritional status F.  Physical activity G. Advance directives H. List of other physicians I.  Hospitalizations, surgeries, and ER visits in previous 12 months J.  [place] to include hearing, vision, cognitive, depression L. Referrals and appointments - none  In addition, I have reviewed and discussed with patient certain preventive protocols, quality metrics, and best practice recommendations. A written personalized care plan for preventive services as well as general preventive health recommendations were provided to patient.  See attached scanned questionnaire for additional information.   Signed,   Lindell Noe, MHA, BS, LPN Health Advisor

## 2015-10-29 NOTE — Patient Instructions (Signed)
Briana Ochoa , Thank you for taking time to come for your Medicare Wellness Visit. I appreciate your ongoing commitment to your health goals. Please review the following plan we discussed and let me know if I can assist you in the future.   These are the goals we discussed: Goals    . Increase water intake          Starting 10/29/2015, I will continue to drink at least 6-8 glasses of water daily.       This is a list of the screening recommended for you and due dates:  Health Maintenance  Topic Date Due  . Complete foot exam   11/04/2015*  . Colon Cancer Screening  10/28/2016*  . Shingles Vaccine  10/28/2016*  . Tetanus Vaccine  10/28/2016*  . Eye exam for diabetics  12/24/2015  . Hemoglobin A1C  04/27/2016  . Flu Shot  Completed  . DEXA scan (bone density measurement)  Completed  . Pneumonia vaccines  Completed  *Topic was postponed. The date shown is not the original due date.   Preventive Care for Adults  A healthy lifestyle and preventive care can promote health and wellness. Preventive health guidelines for adults include the following key practices.  . A routine yearly physical is a good way to check with your health care provider about your health and preventive screening. It is a chance to share any concerns and updates on your health and to receive a thorough exam.  . Visit your dentist for a routine exam and preventive care every 6 months. Brush your teeth twice a day and floss once a day. Good oral hygiene prevents tooth decay and gum disease.  . The frequency of eye exams is based on your age, health, family medical history, use  of contact lenses, and other factors. Follow your health care provider's ecommendations for frequency of eye exams.  . Eat a healthy diet. Foods like vegetables, fruits, whole grains, low-fat dairy products, and lean protein foods contain the nutrients you need without too many calories. Decrease your intake of foods high in solid fats,  added sugars, and salt. Eat the right amount of calories for you. Get information about a proper diet from your health care provider, if necessary.  . Regular physical exercise is one of the most important things you can do for your health. Most adults should get at least 150 minutes of moderate-intensity exercise (any activity that increases your heart rate and causes you to sweat) each week. In addition, most adults need muscle-strengthening exercises on 2 or more days a week.  Silver Sneakers may be a benefit available to you. To determine eligibility, you may visit the website: www.silversneakers.com or contact program at (217)022-3647 Mon-Fri between 8AM-8PM.   . Maintain a healthy weight. The body mass index (BMI) is a screening tool to identify possible weight problems. It provides an estimate of body fat based on height and weight. Your health care provider can find your BMI and can help you achieve or maintain a healthy weight.   For adults 20 years and older: ? A BMI below 18.5 is considered underweight. ? A BMI of 18.5 to 24.9 is normal. ? A BMI of 25 to 29.9 is considered overweight. ? A BMI of 30 and above is considered obese.   . Maintain normal blood lipids and cholesterol levels by exercising and minimizing your intake of saturated fat. Eat a balanced diet with plenty of fruit and vegetables. Blood tests for lipids and  cholesterol should begin at age 7 and be repeated every 5 years. If your lipid or cholesterol levels are high, you are over 50, or you are at high risk for heart disease, you may need your cholesterol levels checked more frequently. Ongoing high lipid and cholesterol levels should be treated with medicines if diet and exercise are not working.  . If you smoke, find out from your health care provider how to quit. If you do not use tobacco, please do not start.  . If you choose to drink alcohol, please do not consume more than 2 drinks per day. One drink is  considered to be 12 ounces (355 mL) of beer, 5 ounces (148 mL) of wine, or 1.5 ounces (44 mL) of liquor.  . If you are 69-87 years old, ask your health care provider if you should take aspirin to prevent strokes.  . Use sunscreen. Apply sunscreen liberally and repeatedly throughout the day. You should seek shade when your shadow is shorter than you. Protect yourself by wearing long sleeves, pants, a wide-brimmed hat, and sunglasses year round, whenever you are outdoors.  . Once a month, do a whole body skin exam, using a mirror to look at the skin on your back. Tell your health care provider of new moles, moles that have irregular borders, moles that are larger than a pencil eraser, or moles that have changed in shape or color.

## 2015-10-30 NOTE — Progress Notes (Signed)
I reviewed health advisor's note, was available for consultation, and agree with documentation and plan.  

## 2015-11-04 ENCOUNTER — Ambulatory Visit (INDEPENDENT_AMBULATORY_CARE_PROVIDER_SITE_OTHER): Payer: Commercial Managed Care - HMO | Admitting: Family Medicine

## 2015-11-04 ENCOUNTER — Encounter: Payer: Self-pay | Admitting: Family Medicine

## 2015-11-04 VITALS — BP 132/80 | HR 79 | Temp 98.1°F | Ht 63.75 in | Wt 200.5 lb

## 2015-11-04 DIAGNOSIS — E08638 Diabetes mellitus due to underlying condition with other oral complications: Secondary | ICD-10-CM

## 2015-11-04 DIAGNOSIS — R5383 Other fatigue: Secondary | ICD-10-CM | POA: Insufficient documentation

## 2015-11-04 DIAGNOSIS — Z0000 Encounter for general adult medical examination without abnormal findings: Secondary | ICD-10-CM

## 2015-11-04 DIAGNOSIS — D239 Other benign neoplasm of skin, unspecified: Secondary | ICD-10-CM

## 2015-11-04 DIAGNOSIS — N183 Chronic kidney disease, stage 3 unspecified: Secondary | ICD-10-CM

## 2015-11-04 DIAGNOSIS — C50112 Malignant neoplasm of central portion of left female breast: Secondary | ICD-10-CM | POA: Diagnosis not present

## 2015-11-04 DIAGNOSIS — D229 Melanocytic nevi, unspecified: Secondary | ICD-10-CM | POA: Diagnosis not present

## 2015-11-04 DIAGNOSIS — Z01419 Encounter for gynecological examination (general) (routine) without abnormal findings: Secondary | ICD-10-CM

## 2015-11-04 NOTE — Assessment & Plan Note (Signed)
Deteriorated. Discussed cutting back on carbs and sugars. Recheck a1c in 3 months.

## 2015-11-04 NOTE — Progress Notes (Addendum)
Subjective:   Patient ID: Briana Ochoa, female    DOB: 21-Jul-1940, 75 y.o.   MRN: [id number]  Briana Ochoa is a pleasant 75 y.o. year old female who presents to clinic today with Annual Exam (Medicare) and Fatigue and follow up of chronic medical conditions on 11/04/2015  Annual medicare wellness visit with Candis Musa on 10/29/15. Notes reviewed.   Breast cancer- followed by Dr. Lindi Adie.  Last seen on 04/21/15- note reviewed. Remains on arimidex since 10/15  HTN- has been well controlled on on lisinopril 20 mg daily.  Denies any HA, blurred vision, CP or SOB.  No LE edema.  CKD III- deteriorated. Lab Results  Component Value Date   CREATININE 1.49 (H) 10/29/2015   Felt to be secondary to NSAIDs.  Has not seen renal since last year.  Cr has been stable.     DM- had been diet controlled for years but a1c deteriorated this month. Lab Results  Component Value Date   HGBA1C 7.0 (H) 10/29/2015     Obesity- has lost a few pounds.  Working on walking more.  Wt Readings from Last 3 Encounters:  11/04/15 200 lb 8 oz (90.9 kg)  10/29/15 200 lb 4 oz (90.8 kg)  04/21/15 197 lb 1.6 oz (89.4 kg)    H/o CVA- LDL at goal on current dose of pravachol.  Lab Results  Component Value Date   CHOL 140 10/29/2015   HDL 59.90 10/29/2015   LDLCALC 56 10/29/2015   TRIG 122.0 10/29/2015   CHOLHDL 2 10/29/2015   Lab Results  Component Value Date   WBC 12.0 (H) 10/28/2014   HGB 16.0 (H) 10/28/2014   HCT 47.3 (H) 10/28/2014   MCV 90.3 10/28/2014   PLT 304.0 10/28/2014    Lab Results  Component Value Date   TSH 3.69 10/29/2015     Current Outpatient Prescriptions on File Prior to Visit  Medication Sig Dispense Refill  . acetaminophen (TYLENOL) 325 MG tablet Take 650 mg by mouth daily as needed (pain).    [name] anastrozole (ARIMIDEX) 1 MG tablet TAKE 1 TABLET BY MOUTH DAILY. 90 tablet 3  . aspirin 325 MG tablet Take 325 mg by mouth daily.    . Hypromellose  (ARTIFICIAL TEARS OP) Apply 1 drop to eye daily as needed (dry eyes).     [name] lisinopril (PRINIVIL,ZESTRIL) 20 MG tablet TAKE 1 TABLET BY MOUTH DAILY. 90 tablet 3  . meclizine (ANTIVERT) 12.5 MG tablet Take 12.5 mg by mouth 3 (three) times daily as needed for dizziness.    . ondansetron (ZOFRAN-ODT) 4 MG disintegrating tablet Take 4 mg by mouth every 8 (eight) hours as needed for nausea or vomiting.    . pravastatin (PRAVACHOL) 20 MG tablet TAKE 1 TABLET (20 MG TOTAL) BY MOUTH DAILY. 90 tablet 3   No current facility-administered medications on file prior to visit.     No Known Allergies  Past Medical History:  Diagnosis Date  . Arthritis    L knee, R shoulder  . Arthritis of knee   . Cancer (HCC)    lt breast  . Chronic kidney disease    renal insuffic, Dr. Jaci Carrel- [place]  . Complication of anesthesia 1970's   difficult time waking- felt like she couldn't breathe  . Diabetes mellitus without complication (HCC)    diet controlled   . Heart murmur    [place]rs[place] ago[place]   . Hypertension   . 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 Radiation 10/21/13-11/11/13   Left Breast  . Stroke ([place])   . Vertigo     Past Surgical History:  Procedure Laterality Date  . BREAST BIOPSY     Left  . BREAST LUMPECTOMY WITH NEEDLE LOCALIZATION Left 09/19/2013   Procedure: LEFT BREAST LUMPECTOMY WITH DOUBLE WIRE BRACKETED  NEEDLE LOCALIZATION;  Surgeon: [name], MD;  Location: [facility];  Service: General;  Laterality: Left;  . CATARACT EXTRACTION, BILATERAL Bilateral 06/2015  . [place]  . CHOLECYSTECTOMY    . FINGER SURGERY     lt thumb  from dog bite  . MULTIPLE TOOTH EXTRACTIONS     top palate- wears full plate  . RADIOLOGY WITH ANESTHESIA N/A 01/09/2014   Procedure: Embolization;  Surgeon: Consuella Lose, MD;  Location: [facility];  Service: Radiology;  Laterality: N/A;  . SPLENECTOMY, TOTAL    . TONSILLECTOMY       Family History  Problem Relation Age of Onset  . Cancer Mother     kidney  . Hypertension Mother   . Heart attack Paternal Grandfather   . Breast cancer Maternal Aunt 80  . Breast cancer Paternal Aunt 21    Social History   Social History  . Marital status: Widowed    Spouse name: N/A  . Number of children: N/A  . Years of education: N/A   Occupational History  .      Retired   Social History Main Topics  . Smoking status: Former Smoker    Packs/day: 1.50    Types: Cigarettes    Quit date: 03/06/2003  . Smokeless tobacco: Never Used  . Alcohol use No  . Drug use: No  . Sexual activity: No   Other Topics Concern  . Not on file   Social History Narrative   Widowed 2013   5 children   17 grandchildren      Does not have a living will-desires CPR, does not want prolonged life support if futile.   The PMH, PSH, Social History, Family History, Medications, and allergies have been reviewed in [facility], and have been updated if relevant.  Review of Systems  Constitutional: Negative.   HENT: Negative.   Eyes: Negative.   Respiratory: Negative.   Cardiovascular: Negative.   Gastrointestinal: Negative.   Endocrine: Negative.   Genitourinary: Negative.   Musculoskeletal: Negative.   Skin: Negative.   Allergic/Immunologic: Negative.   Neurological: Negative.   Hematological: Negative.   Psychiatric/Behavioral: Negative.   All other systems reviewed and are negative.     Objective:    BP 132/80   Pulse 79   Temp 98.1 F (36.7 C) (Oral)   Ht 5' 3.75" (1.619 m)   Wt 200 lb 8 oz (90.9 kg)   SpO2 96%   BMI 34.69 kg/m   Wt Readings from Last 3 Encounters:  11/04/15 200 lb 8 oz (90.9 kg)  10/29/15 200 lb 4 oz (90.8 kg)  04/21/15 197 lb 1.6 oz (89.4 kg)     Physical Exam   General:  Well-developed,well-nourished,in no acute distress; alert,appropriate and cooperative throughout examination Head:  normocephalic and atraumatic.   Eyes:  vision  grossly intact, pupils equal, pupils round, and pupils reactive to light.   Ears:  R ear normal and L ear normal.   Nose:  no external deformity.   Mouth:  good dentition.   Neck:  No deformities, masses, or tenderness noted. Breasts:  No mass,  nodules, thickening, tenderness, bulging, retraction, inflamation, nipple discharge or skin changes noted.   Lungs:  Normal respiratory effort, chest expands symmetrically. Lungs are clear to auscultation, no crackles or wheezes. Heart:  Normal rate and regular rhythm. S1 and S2 normal without gallop, murmur, click, rub or other extra sounds. Abdomen:  Bowel sounds positive,abdomen soft and non-tender without masses, organomegaly or hernias noted. Msk:  No deformity or scoliosis noted of thoracic or lumbar spine.   Extremities:  No clubbing, cyanosis, edema, or deformity noted with normal full range of motion of all joints.   Neurologic:  alert & oriented X3 and gait normal.   Skin:  + dark irregular mole on right lower leg Cervical Nodes:  No lymphadenopathy noted Axillary Nodes:  No palpable lymphadenopathy Psych:  Cognition and judgment appear intact. Alert and cooperative with normal attention span and concentration. No apparent delusions, illusions, hallucinations     Assessment & Plan:   Diabetes mellitus due to underlying condition with other oral complication, without long-term current use of insulin (HCC)  Well woman exam  CKD (chronic kidney disease) stage 3, GFR 30-59 ml/min  Cancer of central portion of left female breast ([place])  Other fatigue No Follow-up on file.

## 2015-11-04 NOTE — Assessment & Plan Note (Signed)
Deteriorated. Increase water intake.  Recheck BMET In 3 months.

## 2015-11-04 NOTE — Assessment & Plan Note (Signed)
Followed by oncology 

## 2015-11-04 NOTE — Progress Notes (Signed)
Pre visit review using our clinic review tool, if applicable. No additional management support is needed unless otherwise documented below in the visit note. 

## 2015-11-04 NOTE — Patient Instructions (Signed)
Great to see you. Try tylenol twice daily every day.  Keep working on cutting on carbohydrates and sugar  Increase water consumption.

## 2015-11-04 NOTE — Addendum Note (Signed)
Addended by: Lucille Passy on: 11/04/2015 01:41 PM   Modules accepted: Orders

## 2015-11-04 NOTE — Assessment & Plan Note (Signed)
Reviewed preventive care protocols, scheduled due services, and updated immunizations Discussed nutrition, exercise, diet, and healthy lifestyle.  

## 2015-11-04 NOTE — Assessment & Plan Note (Signed)
Atypical. Refer to derm.

## 2015-11-16 ENCOUNTER — Other Ambulatory Visit: Payer: Self-pay

## 2015-11-16 ENCOUNTER — Other Ambulatory Visit: Payer: Self-pay | Admitting: *Deleted

## 2015-11-16 MED ORDER — ANASTROZOLE 1 MG PO TABS: 1.0000 mg | ORAL_TABLET | Freq: Every day | ORAL | 3 refills | Status: DC

## 2015-11-16 MED ORDER — PRAVASTATIN SODIUM 20 MG PO TABS: ORAL_TABLET | ORAL | 0 refills | Status: DC

## 2015-11-18 ENCOUNTER — Telehealth: Payer: Self-pay | Admitting: Neurology

## 2015-11-18 ENCOUNTER — Telehealth: Payer: Self-pay

## 2015-11-18 NOTE — Telephone Encounter (Signed)
Pt called in to let the office know she has a new mail order pharmacy [facility] mail order pharmacy. She says they will either call or fax for new prescriptions. May call [phone]

## 2015-11-18 NOTE — Telephone Encounter (Signed)
Pt called in to let the office know she has a new mail order pharmacy [facility] mail order pharmacy. She says they will either call or fax for new prescriptions.

## 2015-11-18 NOTE — Telephone Encounter (Signed)
PTs pharmacy is in the system [place]. Its noted.

## 2015-11-19 NOTE — Telephone Encounter (Signed)
IF patient calls back she needs to scheudle appt with NP or MD. She cancel appt in 09/2015. IF she does not r/s appt pt needs to contact PCP for refills on pravastatin.

## 2015-11-30 ENCOUNTER — Telehealth: Payer: Self-pay

## 2015-11-30 NOTE — Telephone Encounter (Signed)
Rn call patient back about getting pravastatin refill request from [facility]. Rn stated her last appt was in 03/2015. Rn explain she had an appt with Megan(NP) in 09/2015 but she cancel. Pt stated she had a income issues going on. Pt did not want to pay the 45.00 dollars co payment . RN ask her how much was her PCP co payment. PT stated she saw her PCP two weeks ago and it was 10.00 dollars. Rn advised patient if she is stable from a neurological standpoint to contact her PCP. Rn advise pt to call PCP to see if she can restart refilling her Pravastatin. Pt will call her PCP to find out and verbalized understanding.

## 2015-12-02 ENCOUNTER — Other Ambulatory Visit: Payer: Self-pay | Admitting: *Deleted

## 2015-12-02 MED ORDER — TRUE METRIX AIR GLUCOSE METER W/DEVICE KIT
1.0000 | PACK | Freq: Once | 0 refills | Status: AC
Start: 2015-12-02 — End: 2015-12-02

## 2015-12-02 MED ORDER — LISINOPRIL 20 MG PO TABS: 20.0000 mg | ORAL_TABLET | Freq: Every day | ORAL | 3 refills | Status: DC

## 2015-12-02 MED ORDER — GLUCOSE BLOOD VI STRP: ORAL_STRIP | 2 refills | Status: AC

## 2015-12-09 DIAGNOSIS — Z85820 Personal history of malignant melanoma of skin: Secondary | ICD-10-CM

## 2015-12-09 HISTORY — DX: Personal history of malignant melanoma of skin: Z85.820

## 2015-12-14 ENCOUNTER — Other Ambulatory Visit: Payer: Self-pay | Admitting: General Surgery

## 2015-12-22 ENCOUNTER — Encounter (HOSPITAL_BASED_OUTPATIENT_CLINIC_OR_DEPARTMENT_OTHER): Payer: Self-pay | Admitting: *Deleted

## 2015-12-23 ENCOUNTER — Encounter (HOSPITAL_BASED_OUTPATIENT_CLINIC_OR_DEPARTMENT_OTHER)
Admission: RE | Admit: 2015-12-23 | Discharge: 2015-12-23 | Disposition: A | Discharge status: Home / Self Care | Payer: Commercial Managed Care - HMO | Source: Ambulatory Visit | Attending: General Surgery | Admitting: General Surgery

## 2015-12-23 ENCOUNTER — Telehealth: Payer: Self-pay | Admitting: Hematology and Oncology

## 2015-12-23 ENCOUNTER — Other Ambulatory Visit: Payer: Self-pay

## 2015-12-23 DIAGNOSIS — Z79899 Other long term (current) drug therapy: Secondary | ICD-10-CM | POA: Diagnosis not present

## 2015-12-23 DIAGNOSIS — Z7982 Long term (current) use of aspirin: Secondary | ICD-10-CM | POA: Diagnosis not present

## 2015-12-23 DIAGNOSIS — R9431 Abnormal electrocardiogram [ECG] [EKG]: Secondary | ICD-10-CM | POA: Diagnosis present

## 2015-12-23 DIAGNOSIS — Z9081 Acquired absence of spleen: Secondary | ICD-10-CM | POA: Diagnosis not present

## 2015-12-23 DIAGNOSIS — I671 Cerebral aneurysm, nonruptured: Secondary | ICD-10-CM | POA: Diagnosis not present

## 2015-12-23 DIAGNOSIS — E119 Type 2 diabetes mellitus without complications: Secondary | ICD-10-CM | POA: Insufficient documentation

## 2015-12-23 DIAGNOSIS — I10 Essential (primary) hypertension: Secondary | ICD-10-CM | POA: Diagnosis not present

## 2015-12-23 DIAGNOSIS — Z6834 Body mass index (BMI) 34.0-34.9, adult: Secondary | ICD-10-CM | POA: Diagnosis not present

## 2015-12-23 DIAGNOSIS — C4371 Malignant melanoma of right lower limb, including hip: Secondary | ICD-10-CM | POA: Diagnosis not present

## 2015-12-23 DIAGNOSIS — Z853 Personal history of malignant neoplasm of breast: Secondary | ICD-10-CM | POA: Insufficient documentation

## 2015-12-23 LAB — BASIC METABOLIC PANEL
Anion gap: 10 (ref 5–15)
BUN: 24 mg/dL — ABNORMAL HIGH (ref 6–20)
CO2: 25 mmol/L (ref 22–32)
Calcium: 9.8 mg/dL (ref 8.9–10.3)
Chloride: 103 mmol/L (ref 101–111)
Creatinine, Ser: 1.51 mg/dL — ABNORMAL HIGH (ref 0.44–1.00)
GFR calc Af Amer: 38 mL/min — ABNORMAL LOW (ref >60)
GFR calc non Af Amer: 33 mL/min — ABNORMAL LOW (ref >60)
Glucose, Bld: 141 mg/dL — ABNORMAL HIGH (ref 65–99)
Potassium: 4.8 mmol/L (ref 3.5–5.1)
Sodium: 138 mmol/L (ref 135–145)

## 2015-12-23 NOTE — Progress Notes (Signed)
Pt arrived for PAT blood work, EKG. Gave pt Boost Breeze drink for DOS with instruction to have drink completed by 0900 on DOS. Pt will refrigerate drink when she gets home.

## 2015-12-23 NOTE — Telephone Encounter (Signed)
Appointment rescheduled per patient request. °

## 2015-12-24 NOTE — Progress Notes (Signed)
Abnormal lab reviewed by Dr Smith Robert no further treatment needed

## 2015-12-25 ENCOUNTER — Ambulatory Visit: Payer: Commercial Managed Care - HMO | Admitting: Hematology and Oncology

## 2015-12-25 NOTE — H&P (Signed)
Briana Ochoa Location: [facility] Patient #: [phone] DOB: January 04, 1941 Widowed / Language: Cleophus Molt / Race: White Female        History of Present Illness The patient is a 75 year old female who presents for a melanoma biopsy follow-up. This is a 75 year old Caucasian female, who currently followed for her breast cancer. She was recently seen by Dr. Nena Polio and was found to have a small melanoma of the right lower leg. She is referred for excision.  She has never had a melanoma before. Never had skin cancer before. Noticed a pigmented lesion this year. Shave biopsy was performed by Dr. Allyson Sabal on November 20, 2015. He described a 6 mm diameter pigmented lesion in the right anterior lower leg overlying the anterior compartment. The pathology report shows a 0.4 mm thick malignant melanoma. Lateral margins are involved but the deep the margins are not involved. No ulceration, no vascular invasion, no mitotic figures, no regression. Stage TIa.  Past history significant cancer left breast treated with left partial mastectomy in September 08, 2013. She had receptor-positive DCIS. 2 separate foci. Negative margins. Positive receptors. Following wound healing she received radiation therapy. Currently on Arimidex and followed by Dr. Lindi Adie. carotid artery aneurysm treated with stent. She was on Plavix for some time but that is now stopped and she takes only aspirin. Non-insulin-dependent diabetes mellitus. Benign hypertension. Hyperlipidemia. Splenectomy following accident horseback riding. C-section. Cholecystectomy. BMI 34.  After examining this I told her that we would try to excise this with at least a 1 cm margin. I did tell her that the skin is tight. We might have to make a lazy S. She is strongly hoping to avoid skin graft. She is ready to have this done. I told her that a melanoma this thin does not require sentinel node biopsy. I discussed the  indications, details, techniques, and numerous risk of the surgery with her and her friend. She is aware of the risks of bleeding, infection, wound dehiscence, further surgery if we found the tumor is wider or deeper than is anticipated, numbness or chronic pain from nerve damage. She understands all these issues. All of her questions are answered. She agrees with this plan.   Allergies  No Known Drug Allergies  Medication History  Accu-Chek Softclix Lancet Dev Active. Anastrozole (1MG  Tablet, Oral) Active. QC Aspirin (325MG  Tablet, Oral) Active. Ondansetron (4MG  Tablet Disint, Oral as needed) Active. Lisinopril (20MG  Tablet, Oral) Active. Zofran (4MG  Tablet, Oral) Active. Meclizine HCl (12.5MG  Tablet, Oral) Active. Tylenol (325MG  Tablet, Oral) Active. Hypromellose (Ophthalmic) Specific dose unknown - Active. Medications Reconciled  Vitals  Weight: 199 lb Height: 64in Body Surface Area: 1.95 m Body Mass Index: 34.16 kg/m  Pulse: 90 (Regular)  BP: 108/76 (Sitting, Left Arm, Standard)   Physical Exam General Mental Status-Alert. General Appearance-Consistent with stated age. Hydration-Well hydrated. Voice-Normal. Note: Her friend is with her throughout the encounter. BMI 34   Integumentary Note: In the right lower extremity, overlying the anterior compartment, almost 2 cm lateral to the tibia is a 5 mm diameter eschar. No infection. Tissues are soft but not elastic.   Head and Neck Head-normocephalic, atraumatic with no lesions or palpable masses. Trachea-midline. Thyroid Gland Characteristics - normal size and consistency.  Eye Eyeball - Bilateral-Extraocular movements intact. Sclera/Conjunctiva - Bilateral-No scleral icterus.  Chest and Lung Exam Chest and lung exam reveals -quiet, even and easy respiratory effort with no use of accessory muscles and on auscultation, normal breath sounds, no adventitious sounds  and normal  vocal resonance. Inspection Chest Wall - Normal. Back - normal.  Cardiovascular Cardiovascular examination reveals -normal heart sounds, regular rate and rhythm with no murmurs and normal pedal pulses bilaterally.  Abdomen Inspection Inspection of the abdomen reveals - No Hernias. Skin - Scar - Note: Scars from C-section, splenectomy, cholecystectomy. Palpation/Percussion Palpation and Percussion of the abdomen reveal - Soft, Non Tender, No Rebound tenderness, No Rigidity (guarding) and No hepatosplenomegaly. Auscultation Auscultation of the abdomen reveals - Bowel sounds normal.  Neurologic Neurologic evaluation reveals -alert and oriented x 3 with no impairment of recent or remote memory. Mental Status-Normal.  Musculoskeletal Normal Exam - Left-Upper Extremity Strength Normal and Lower Extremity Strength Normal. Normal Exam - Right-Upper Extremity Strength Normal and Lower Extremity Strength Normal.  Lymphatic Head & Neck  General Head & Neck Lymphatics: Bilateral - Description - Normal. Axillary  General Axillary Region: Bilateral - Description - Normal. Tenderness - Non Tender. Femoral & Inguinal  Generalized Femoral & Inguinal Lymphatics: Bilateral - Description - Normal. Tenderness - Non Tender. Note: No enlarged lymph nodes in right inguinal or right popliteal area. No enlarged lymph nodes in the neck either.     Assessment & Plan  MELANOMA OF LOWER LEG, RIGHT (C43.71)  You have been diagnosed with a malignant melanoma of your right lower leg. fortunately this is only 0.4 mm thick, stage IA. This area will need to be excised with at least a 1 cm margin as I described to you. There is no indication for lymph node mapping and biopsy for a melanoma this thin We have discussed the indications, techniques, and risks of this surgery in detail.  Please read the printed information that we have given you  you will need to see Dr. Allyson Sabal every 6 months for  total body surveillance from here on out  HISTORY OF BREAST CANCER IN FEMALE (Z85.3) BMI 34.0-34.9,ADULT (Z68.34) HYPERTENSION, BENIGN (I10) TYPE 2 DIABETES MELLITUS WITHOUT COMPLICATION, WITHOUT LONG-TERM CURRENT USE OF INSULIN (E11.9) HISTORY OF SPLENECTOMY (Z90.81) CEREBRAL ANEURYSM (I67.1) Impression: Status post carotid artery stent. Previously on Plavix. Current medication aspirin only.   Edsel Petrin. Dalbert Batman, M.D., [facility] General and Minimally invasive Surgery Breast and Colorectal Surgery Office:   [phone] Pager:   [phone]

## 2015-12-28 ENCOUNTER — Encounter (HOSPITAL_BASED_OUTPATIENT_CLINIC_OR_DEPARTMENT_OTHER): Payer: Self-pay | Admitting: *Deleted

## 2015-12-28 ENCOUNTER — Encounter (HOSPITAL_BASED_OUTPATIENT_CLINIC_OR_DEPARTMENT_OTHER)
Admission: RE | Disposition: A | Discharge status: Home / Self Care | Payer: Self-pay | Source: Ambulatory Visit | Attending: General Surgery

## 2015-12-28 ENCOUNTER — Ambulatory Visit (HOSPITAL_BASED_OUTPATIENT_CLINIC_OR_DEPARTMENT_OTHER): Payer: Commercial Managed Care - HMO | Admitting: Anesthesiology

## 2015-12-28 ENCOUNTER — Ambulatory Visit (HOSPITAL_BASED_OUTPATIENT_CLINIC_OR_DEPARTMENT_OTHER)
Admission: RE | Admit: 2015-12-28 | Discharge: 2015-12-28 | Disposition: A | Discharge status: Home / Self Care | Payer: Commercial Managed Care - HMO | Source: Ambulatory Visit | Attending: General Surgery | Admitting: General Surgery

## 2015-12-28 DIAGNOSIS — Z7982 Long term (current) use of aspirin: Secondary | ICD-10-CM | POA: Insufficient documentation

## 2015-12-28 DIAGNOSIS — Z853 Personal history of malignant neoplasm of breast: Secondary | ICD-10-CM | POA: Diagnosis not present

## 2015-12-28 DIAGNOSIS — C4371 Malignant melanoma of right lower limb, including hip: Secondary | ICD-10-CM | POA: Diagnosis present

## 2015-12-28 DIAGNOSIS — E785 Hyperlipidemia, unspecified: Secondary | ICD-10-CM | POA: Diagnosis not present

## 2015-12-28 DIAGNOSIS — Z79811 Long term (current) use of aromatase inhibitors: Secondary | ICD-10-CM | POA: Insufficient documentation

## 2015-12-28 DIAGNOSIS — E119 Type 2 diabetes mellitus without complications: Secondary | ICD-10-CM | POA: Diagnosis not present

## 2015-12-28 DIAGNOSIS — Z87891 Personal history of nicotine dependence: Secondary | ICD-10-CM | POA: Diagnosis not present

## 2015-12-28 DIAGNOSIS — K219 Gastro-esophageal reflux disease without esophagitis: Secondary | ICD-10-CM | POA: Diagnosis not present

## 2015-12-28 DIAGNOSIS — I10 Essential (primary) hypertension: Secondary | ICD-10-CM | POA: Diagnosis not present

## 2015-12-28 DIAGNOSIS — Z79899 Other long term (current) drug therapy: Secondary | ICD-10-CM | POA: Diagnosis not present

## 2015-12-28 HISTORY — PX: MELANOMA EXCISION: SHX5266

## 2015-12-28 HISTORY — DX: Gastro-esophageal reflux disease without esophagitis: K21.9

## 2015-12-28 SURGERY — EXCISION, MELANOMA
Anesthesia: General | Site: Leg Lower | Laterality: Right

## 2015-12-28 MED ORDER — CELECOXIB 200 MG PO CAPS
ORAL_CAPSULE | ORAL | Status: AC
  Filled 2015-12-28: qty 2

## 2015-12-28 MED ORDER — BUPIVACAINE HCL (PF) 0.5 % IJ SOLN
INTRAMUSCULAR | Status: DC | PRN
  Administered 2015-12-28: 7 mL

## 2015-12-28 MED ORDER — FENTANYL CITRATE (PF) 100 MCG/2ML IJ SOLN
50.0000 ug | INTRAMUSCULAR | Status: AC | PRN
  Administered 2015-12-28: 25 ug via INTRAVENOUS
  Administered 2015-12-28: 50 ug via INTRAVENOUS
  Administered 2015-12-28: 25 ug via INTRAVENOUS

## 2015-12-28 MED ORDER — ACETAMINOPHEN 500 MG PO TABS: 1000.0000 mg | ORAL_TABLET | ORAL | Status: DC

## 2015-12-28 MED ORDER — CEFAZOLIN SODIUM-DEXTROSE 2-4 GM/100ML-% IV SOLN
INTRAVENOUS | Status: AC
  Filled 2015-12-28: qty 100

## 2015-12-28 MED ORDER — CEFAZOLIN SODIUM-DEXTROSE 2-4 GM/100ML-% IV SOLN
2.0000 g | INTRAVENOUS | Status: AC
  Administered 2015-12-28: 2 g via INTRAVENOUS

## 2015-12-28 MED ORDER — SODIUM CHLORIDE 0.9 % IV SOLN: 250.0000 mL | INTRAVENOUS | Status: DC | PRN

## 2015-12-28 MED ORDER — OXYCODONE HCL 5 MG PO TABS
5.0000 mg | ORAL_TABLET | ORAL | Status: DC | PRN
Start: 2015-12-28 — End: 2015-12-28

## 2015-12-28 MED ORDER — CHLORHEXIDINE GLUCONATE CLOTH 2 % EX PADS: 6.0000 | MEDICATED_PAD | Freq: Once | CUTANEOUS | Status: DC

## 2015-12-28 MED ORDER — ACETAMINOPHEN 650 MG RE SUPP: 650.0000 mg | RECTAL | Status: DC | PRN

## 2015-12-28 MED ORDER — HYDROCODONE-ACETAMINOPHEN 5-325 MG PO TABS: 1.0000 | ORAL_TABLET | Freq: Four times a day (QID) | ORAL | 0 refills | Status: DC | PRN

## 2015-12-28 MED ORDER — SODIUM CHLORIDE 0.9% FLUSH: 3.0000 mL | Freq: Two times a day (BID) | INTRAVENOUS | Status: DC

## 2015-12-28 MED ORDER — HYDROMORPHONE HCL 1 MG/ML IJ SOLN: 0.2500 mg | INTRAMUSCULAR | Status: DC | PRN

## 2015-12-28 MED ORDER — MIDAZOLAM HCL 2 MG/2ML IJ SOLN: 1.0000 mg | INTRAMUSCULAR | Status: DC | PRN

## 2015-12-28 MED ORDER — ONDANSETRON HCL 4 MG/2ML IJ SOLN: 4.0000 mg | Freq: Once | INTRAMUSCULAR | Status: DC | PRN

## 2015-12-28 MED ORDER — LACTATED RINGERS IV SOLN
INTRAVENOUS | Status: DC
  Administered 2015-12-28: 10 mL/h via INTRAVENOUS

## 2015-12-28 MED ORDER — MEPERIDINE HCL 25 MG/ML IJ SOLN: 6.2500 mg | INTRAMUSCULAR | Status: DC | PRN

## 2015-12-28 MED ORDER — FENTANYL CITRATE (PF) 100 MCG/2ML IJ SOLN: 25.0000 ug | INTRAMUSCULAR | Status: DC | PRN

## 2015-12-28 MED ORDER — ACETAMINOPHEN 325 MG PO TABS: 650.0000 mg | ORAL_TABLET | ORAL | Status: DC | PRN

## 2015-12-28 MED ORDER — CELECOXIB 400 MG PO CAPS: 400.0000 mg | ORAL_CAPSULE | ORAL | Status: DC

## 2015-12-28 MED ORDER — PROPOFOL 10 MG/ML IV BOLUS
INTRAVENOUS | Status: DC | PRN
  Administered 2015-12-28: 150 mg via INTRAVENOUS

## 2015-12-28 MED ORDER — SODIUM CHLORIDE 0.9% FLUSH: 3.0000 mL | INTRAVENOUS | Status: DC | PRN

## 2015-12-28 MED ORDER — GABAPENTIN 300 MG PO CAPS: 300.0000 mg | ORAL_CAPSULE | ORAL | Status: DC

## 2015-12-28 MED ORDER — LIDOCAINE 2% (20 MG/ML) 5 ML SYRINGE
INTRAMUSCULAR | Status: DC | PRN
  Administered 2015-12-28: 60 mg via INTRAVENOUS

## 2015-12-28 MED ORDER — PROPOFOL 10 MG/ML IV BOLUS
INTRAVENOUS | Status: AC
  Filled 2015-12-28: qty 20

## 2015-12-28 MED ORDER — SCOPOLAMINE 1 MG/3DAYS TD PT72: 1.0000 | MEDICATED_PATCH | Freq: Once | TRANSDERMAL | Status: DC | PRN

## 2015-12-28 MED ORDER — GABAPENTIN 300 MG PO CAPS
ORAL_CAPSULE | ORAL | Status: AC
  Filled 2015-12-28: qty 1

## 2015-12-28 MED ORDER — ACETAMINOPHEN 500 MG PO TABS
ORAL_TABLET | ORAL | Status: AC
  Filled 2015-12-28: qty 2

## 2015-12-28 MED ORDER — LIDOCAINE 2% (20 MG/ML) 5 ML SYRINGE
INTRAMUSCULAR | Status: AC
  Filled 2015-12-28: qty 5

## 2015-12-28 MED ORDER — ONDANSETRON HCL 4 MG/2ML IJ SOLN
INTRAMUSCULAR | Status: DC | PRN
  Administered 2015-12-28: 4 mg via INTRAVENOUS

## 2015-12-28 MED ORDER — LACTATED RINGERS IV SOLN: INTRAVENOUS | Status: DC

## 2015-12-28 MED ORDER — FENTANYL CITRATE (PF) 100 MCG/2ML IJ SOLN
INTRAMUSCULAR | Status: AC
  Filled 2015-12-28: qty 2

## 2015-12-28 SURGICAL SUPPLY — 40 items
APL SKNCLS STERI-STRIP NONHPOA (GAUZE/BANDAGES/DRESSINGS) ×1
BENZOIN TINCTURE PRP APPL 2/3 (GAUZE/BANDAGES/DRESSINGS) ×2 IMPLANT
BLADE HEX COATED 2.75 (ELECTRODE) ×2 IMPLANT
BLADE SURG 15 STRL LF DISP TIS (BLADE) ×1 IMPLANT
BLADE SURG 15 STRL SS (BLADE) ×2
CANISTER SUCT 1200ML W/VALVE (MISCELLANEOUS) ×2 IMPLANT
CHLORAPREP W/TINT 26ML (MISCELLANEOUS) ×2 IMPLANT
COVER BACK TABLE 60X90IN (DRAPES) ×2 IMPLANT
COVER MAYO STAND STRL (DRAPES) ×2 IMPLANT
DRAPE LAPAROTOMY 100X72 PEDS (DRAPES) ×2 IMPLANT
DRAPE UTILITY XL STRL (DRAPES) ×2 IMPLANT
ELECT REM PT RETURN 9FT ADLT (ELECTROSURGICAL) ×2
ELECTRODE REM PT RTRN 9FT ADLT (ELECTROSURGICAL) ×1 IMPLANT
GLOVE BIOGEL PI IND STRL 7.0 (GLOVE) IMPLANT
GLOVE BIOGEL PI IND STRL 8 (GLOVE) IMPLANT
GLOVE BIOGEL PI INDICATOR 7.0 (GLOVE) ×2
GLOVE BIOGEL PI INDICATOR 8 (GLOVE) ×1
GLOVE ECLIPSE 6.5 STRL STRAW (GLOVE) ×1 IMPLANT
GLOVE EUDERMIC 7 POWDERFREE (GLOVE) ×2 IMPLANT
GLOVE SURG SS PI 7.5 STRL IVOR (GLOVE) ×1 IMPLANT
GOWN STRL REUS W/ TWL LRG LVL3 (GOWN DISPOSABLE) ×1 IMPLANT
GOWN STRL REUS W/ TWL XL LVL3 (GOWN DISPOSABLE) ×1 IMPLANT
GOWN STRL REUS W/TWL LRG LVL3 (GOWN DISPOSABLE) ×4
GOWN STRL REUS W/TWL XL LVL3 (GOWN DISPOSABLE) ×2
NDL HYPO 25X1 1.5 SAFETY (NEEDLE) ×1 IMPLANT
NEEDLE HYPO 25X1 1.5 SAFETY (NEEDLE) ×2 IMPLANT
NS IRRIG 1000ML POUR BTL (IV SOLUTION) ×2 IMPLANT
PACK BASIN DAY SURGERY FS (CUSTOM PROCEDURE TRAY) ×2 IMPLANT
PENCIL BUTTON HOLSTER BLD 10FT (ELECTRODE) ×2 IMPLANT
SLEEVE SCD COMPRESS KNEE MED (MISCELLANEOUS) ×1 IMPLANT
SPONGE GAUZE 4X4 12PLY STER LF (GAUZE/BANDAGES/DRESSINGS) ×2 IMPLANT
STRIP CLOSURE SKIN 1/2X4 (GAUZE/BANDAGES/DRESSINGS) ×2 IMPLANT
SUT ETHILON 3 0 PS 1 (SUTURE) ×3 IMPLANT
SUT VICRYL 3-0 CR8 SH (SUTURE) ×1 IMPLANT
SYR 10ML LL (SYRINGE) ×2 IMPLANT
TOWEL OR 17X24 6PK STRL BLUE (TOWEL DISPOSABLE) ×4 IMPLANT
TOWEL OR NON WOVEN STRL DISP B (DISPOSABLE) ×2 IMPLANT
TRAY DSU PREP LF (CUSTOM PROCEDURE TRAY) ×2 IMPLANT
TUBE CONNECTING 20X1/4 (TUBING) ×2 IMPLANT
YANKAUER SUCT BULB TIP NO VENT (SUCTIONS) ×2 IMPLANT

## 2015-12-28 NOTE — Interval H&P Note (Signed)
History and Physical Interval Note:  12/28/2015 8:28 AM  Briana Ochoa  has presented today for surgery, with the diagnosis of melanoma right lower leg  The various methods of treatment have been discussed with the patient and family. After consideration of risks, benefits and other options for treatment, the patient has consented to  Procedure(s): EXCISION MELANOMA RIGHT LOWER LEG (Right) as a surgical intervention .  The patient's history has been reviewed, patient examined, no change in status, stable for surgery.  I have reviewed the patient's chart and labs.  Questions were answered to the patient's satisfaction.     [name]

## 2015-12-28 NOTE — Discharge Instructions (Signed)
Elevate left leg with knee slightly flexed.  2 or 3 pillows at all times when not walking  You may walk around the house from room to room, but keep leg elevated when you are still  You may take a shower, starting 48 hours from now You may change the dressing before each shower After you shower place dry gauze bandage and then replaced the elastic Ace wrap  The more you elevate the leg, the less the swelling will be and the greater likelihood that the wound will heal properly.  I will remove the sutures in the office in about 12 days.  Please call to set the appointment up.  You are given a prescription for hydrocodone for pain.  Use that sparingly.  Transition to ibuprofen or Tylenol as soon as possible.  I hope to call the pathology report to you by Wednesday.  If not it will be next week. I am expecting the pathology report to show clean margins.   Call your surgeon if you experience:   1.  Fever over 101.0. 2.  Inability to urinate. 3.  Nausea and/or vomiting. 4.  Extreme swelling or bruising at the surgical site. 5.  Continued bleeding from the incision. 6.  Increased pain, redness or drainage from the incision. 7.  Problems related to your pain medication. 8.  Any problems and/or concerns    Post Anesthesia Home Care Instructions  Activity: Get plenty of rest for the remainder of the day. A responsible adult should stay with you for 24 hours following the procedure.  For the next 24 hours, DO NOT: -Drive a car -Paediatric nurse -Drink alcoholic beverages -Take any medication unless instructed by your physician -Make any legal decisions or sign important papers.  Meals: Start with liquid foods such as gelatin or soup. Progress to regular foods as tolerated. Avoid greasy, spicy, heavy foods. If nausea and/or vomiting occur, drink only clear liquids until the nausea and/or vomiting subsides. Call your physician if vomiting continues.  Special  Instructions/Symptoms: Your throat may feel dry or sore from the anesthesia or the breathing tube placed in your throat during surgery. If this causes discomfort, gargle with warm salt water. The discomfort should disappear within 24 hours.  If you had a scopolamine patch placed behind your ear for the management of post- operative nausea and/or vomiting:  1. The medication in the patch is effective for 72 hours, after which it should be removed.  Wrap patch in a tissue and discard in the trash. Wash hands thoroughly with soap and water. 2. You may remove the patch earlier than 72 hours if you experience unpleasant side effects which may include dry mouth, dizziness or visual disturbances. 3. Avoid touching the patch. Wash your hands with soap and water after contact with the patch.

## 2015-12-28 NOTE — Anesthesia Preprocedure Evaluation (Signed)
Anesthesia Evaluation  Patient identified by MRN, date of birth, ID band Patient awake    Reviewed: Allergy & Precautions, NPO status , Patient's Chart, lab work & pertinent test results  Airway Mallampati: I  TM Distance: >3 FB Neck ROM: Full    Dental   Pulmonary former smoker,    Pulmonary exam normal        Cardiovascular hypertension, Pt. on medications Normal cardiovascular exam     Neuro/Psych    GI/Hepatic GERD  Medicated and Controlled,  Endo/Other  diabetes  Renal/GU      Musculoskeletal   Abdominal   Peds  Hematology   Anesthesia Other Findings   Reproductive/Obstetrics                             Anesthesia Physical Anesthesia Plan  ASA: III  Anesthesia Plan: General   Post-op Pain Management:    Induction: Intravenous  Airway Management Planned: LMA  Additional Equipment:   Intra-op Plan:   Post-operative Plan: Extubation in OR  Informed Consent: I have reviewed the patients History and Physical, chart, labs and discussed the procedure including the risks, benefits and alternatives for the proposed anesthesia with the patient or authorized representative who has indicated his/her understanding and acceptance.     Plan Discussed with: CRNA and Surgeon  Anesthesia Plan Comments:         Anesthesia Quick Evaluation

## 2015-12-28 NOTE — Op Note (Signed)
Patient Name:           Briana Ochoa   Date of Surgery:        12/28/2015  Pre op Diagnosis:      Malignant melanoma right lower extremity, pretibial area, 0.4 mm thickness  Post op Diagnosis:    Same  Procedure:                 Wide local excision malignant melanoma right lower extremity, pretibial area  Surgeon:                     Edsel Petrin. Dalbert Batman, M.D., FACS  Assistant:                      None   Indication for Assistant: N/A  Operative Indications:   The patient is a 75 year old female who presents for a melanoma biopsy follow-up. This is a 75 year old Caucasian female, who currently followed for her breast cancer. She was recently seen by Dr. Nena Polio and was found to have a small melanoma of the right lower leg. She is referred for excision.    She has never had a melanoma before. Never had skin cancer before. Noticed a pigmented lesion this year. Shave biopsy was performed by Dr. Allyson Sabal on November 20, 2015. He described a 6 mm diameter pigmented lesion in the right anterior lower leg overlying the anterior compartment. The pathology report shows a 0.4 mm thick malignant melanoma. Lateral margins are involved but the deep the margins are not involved. No ulceration, no vascular invasion, no mitotic figures, no regression. Stage TIa.     Past history significant cancer left breast treated with left partial mastectomy in September 08, 2013. She had receptor-positive DCIS. 2 separate foci. Negative margins. Positive receptors. Following wound healing she received radiation therapy. Currently on Arimidex and followed by Dr. Lindi Adie. carotid artery aneurysm treated with stent. She was on Plavix for some time but that is now stopped and she takes only aspirin. Non-insulin-dependent diabetes mellitus. Benign hypertension. Hyperlipidemia. Splenectomy following accident horseback riding. C-section. Cholecystectomy. BMI 34.     After examining this I told her that  we would try to excise this with  a 1 cm margin. I did tell her that the skin is tight. We might have to make a lazy S. She is strongly hoping to avoid skin graft. She is ready to have this done. I told her that a melanoma this thin does not require sentinel node biopsy. I discussed the indications, details, techniques, and numerous risk of the surgery with her and her friend. She is aware of the risks of bleeding, infection, wound dehiscence, further surgery if we found the tumor is wider or deeper than is anticipated, numbness or chronic pain from nerve damage. She understands all these issues. All of her questions are answered. She agrees with this plan.  Operative Findings:       There was a scar in the right pretibial area overlying the anterior tibial compartment about 6 mm in diameter.  There was no visible melanoma.  We made a 8 cm long by 3 cm wide lazy S incision, taking the dissection all the way down to the muscle fascia, and we were able to close this primarily.  The skin and the subcutaneous tissue was very thin and would not hold dermal sutures well so this lesion was closed with interrupted nylon.  Procedure in Detail:  Following the induction of general LMA anesthesia the patient's right leg was prepped and draped in a sterile fashion.  Intravenous antibodies were given.  Surgical timeout was performed.  0.5% Marcaine was used as local infiltration anesthetic.  I used a ruler to mark  my margins and I got at least a 1 cm margin all directions.  A vertically oriented lazy S incision was marked and then made  and the skin and subcutaneous tissue were dissected all the way down to the muscle fascia.  Sutures were placed.  Long suture lateral, short suture superior and proximal.  This was sent to dermatology pathology.  Hemostasis was excellent and achieved with electrocautery.  The skin was undermined medially and laterally to allow a little bit of mobilization.  The skin was  closed with interrupted 3-0 nylon's.  Xeroform bandage Kerlix and Ace wrap were placed.  The patient tolerated the procedure well was taken to PACU in stable condition.  EBL 10 mL.  Counts correct.  Complications none.     Edsel Petrin. Dalbert Batman, M.D., FACS General and Minimally Invasive Surgery Breast and Colorectal Surgery  12/28/2015 9:37 AM

## 2015-12-28 NOTE — Anesthesia Postprocedure Evaluation (Signed)
Anesthesia Post Note  Patient: Meka Alessandra Blodgett  Procedure(s) Performed: Procedure(s) (LRB): EXCISION MELANOMA RIGHT LOWER LEG (Right)  Patient location during evaluation: PACU Anesthesia Type: General Level of consciousness: awake and alert Pain management: pain level controlled Vital Signs Assessment: post-procedure vital signs reviewed and stable Respiratory status: spontaneous breathing, nonlabored ventilation, respiratory function stable and patient connected to nasal cannula oxygen Cardiovascular status: blood pressure returned to baseline and stable Postop Assessment: no signs of nausea or vomiting Anesthetic complications: no    Last Vitals:  Vitals:   12/28/15 1015 12/28/15 1040  BP: (!) 109/57 110/60  Pulse: 71 77  Resp: 17 20  Temp:  36.7 C    Last Pain:  Vitals:   12/28/15 1040  TempSrc:   PainSc: 0-No pain                 Verdia Bolt DAVID

## 2015-12-28 NOTE — Transfer of Care (Signed)
Immediate Anesthesia Transfer of Care Note  Patient: Briana Ochoa  Procedure(s) Performed: Procedure(s) (LRB): EXCISION MELANOMA RIGHT LOWER LEG (Right)  Patient Location: PACU  Anesthesia Type: General  Level of Consciousness: awake, oriented, sedated and patient cooperative  Airway & Oxygen Therapy: Patient Spontanous Breathing and Patient connected to face mask oxygen  Post-op Assessment: Report given to PACU RN and Post -op Vital signs reviewed and stable  Post vital signs: Reviewed and stable  Complications: No apparent anesthesia complications

## 2015-12-28 NOTE — Anesthesia Procedure Notes (Signed)
Procedure Name: LMA Insertion Date/Time: 12/28/2015 9:03 AM Performed by: Denna Haggard D Pre-anesthesia Checklist: Patient identified, Emergency Drugs available, Suction available and Patient being monitored Patient Re-evaluated:Patient Re-evaluated prior to inductionOxygen Delivery Method: Circle system utilized Preoxygenation: Pre-oxygenation with 100% oxygen Intubation Type: IV induction Ventilation: Mask ventilation without difficulty LMA: LMA inserted LMA Size: 4.0 Number of attempts: 1 Airway Equipment and Method: Bite block Placement Confirmation: positive ETCO2 Tube secured with: Tape Dental Injury: Teeth and Oropharynx as per pre-operative assessment

## 2015-12-29 ENCOUNTER — Encounter (HOSPITAL_BASED_OUTPATIENT_CLINIC_OR_DEPARTMENT_OTHER): Payer: Self-pay | Admitting: General Surgery

## 2015-12-29 NOTE — Progress Notes (Signed)
Inform patient of Pathology report,. Tell her that the melanoma was completely resected with a negative margin.  No further surgery required. Only low grade melanoma was found. Good news. Will discuss in detail at next OV.  hmi

## 2016-01-10 NOTE — Assessment & Plan Note (Deleted)
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 Patient denies any hot flashes or myalgias 2. Bone density March 2016: T score -1.5  Breast cancer surveillance: 1. Breast exam 01/11/2016 is normal 2. Mammograms June 2016: Breast density category B, normal  Survivorship: I discussed the importance of physical exercise and she will plan on doing water aerobics and her swimming pool.. I discussed the importance of eating right losing weight staying healthy and active.  Return to clinic in 1 year

## 2016-01-11 ENCOUNTER — Ambulatory Visit: Payer: Commercial Managed Care - HMO | Admitting: Hematology and Oncology

## 2016-01-25 ENCOUNTER — Telehealth: Payer: Self-pay | Admitting: *Deleted

## 2016-01-25 MED ORDER — PRAVASTATIN SODIUM 20 MG PO TABS: ORAL_TABLET | ORAL | 3 refills | Status: DC

## 2016-01-25 NOTE — Telephone Encounter (Signed)
Yes I would be happy to take over prescribing it.  eRx refills sent as requested.

## 2016-01-25 NOTE — Telephone Encounter (Signed)
Patient left a voicemail stating that she is on Pravastatin that was prescribed by her neurologist. Patient stated that the only reason that she continues to go to the neurologist is for refills on this medication. Patient wants to know if Dr. Deborra Medina will take over prescribing this for her? Patient stated if so, she needs a refill sent to [facility] mail order pharmacy.

## 2016-02-03 IMAGING — CT CT ANGIO HEAD
2 of 6 series · 14 of 37 positions shown · IV contrast (OMNIPAQUE 300)
Comparison: None.

CLINICAL DATA: Evaluate for aneurysm.  Follow-up abnormal head CT.

EXAM:
CT ANGIOGRAPHY HEAD
TECHNIQUE: Multidetector CT imaging of the head was performed using the
standard protocol during bolus administration of intravenous
contrast. Multiplanar CT image reconstructions and MIPs were
obtained to evaluate the vascular anatomy.
CONTRAST:  100mL OMNIPAQUE IOHEXOL 350 MG/ML SOLN

[Series 6: axial · axial · 0.30mm/px · z∈[-193,-60]mm · 11 of 156 slices shown]
[im 11/156  brain]
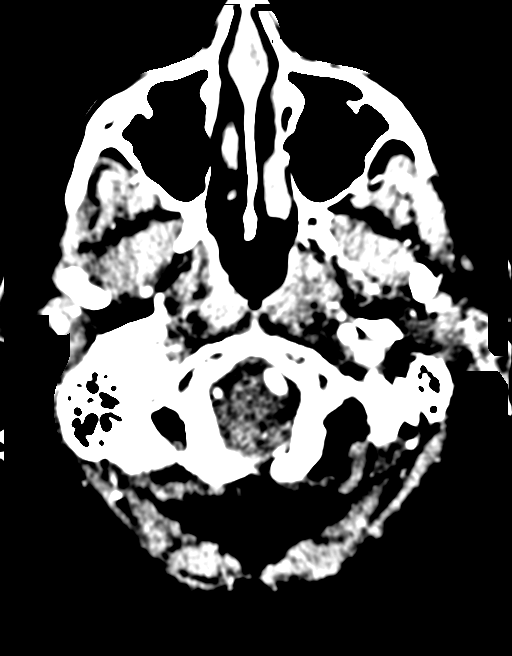
[im 21/156  bone]
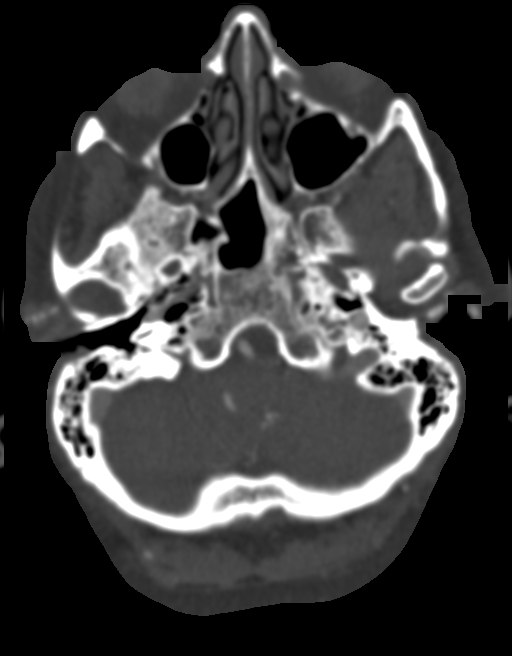
[im 42/156  brain]
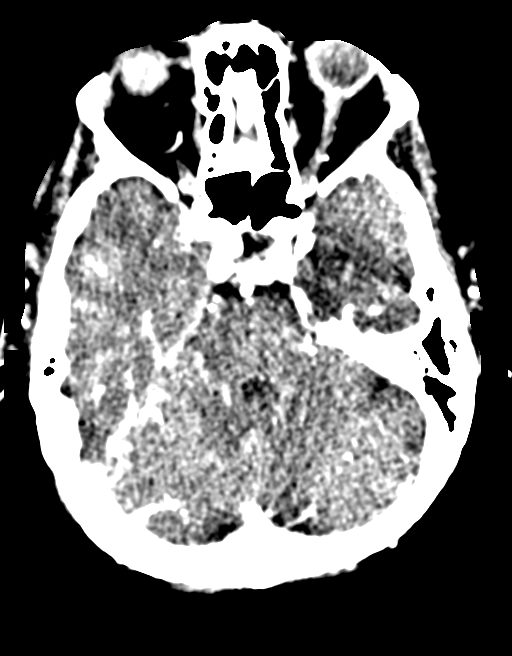
[im 52/156  bone]
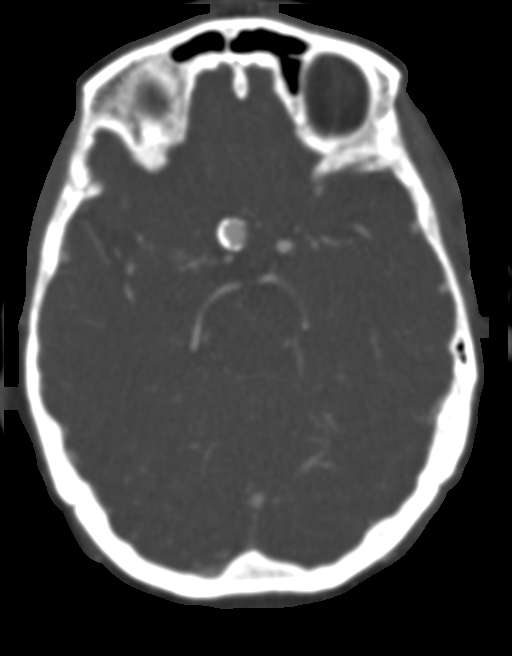
[im 63/156  brain]
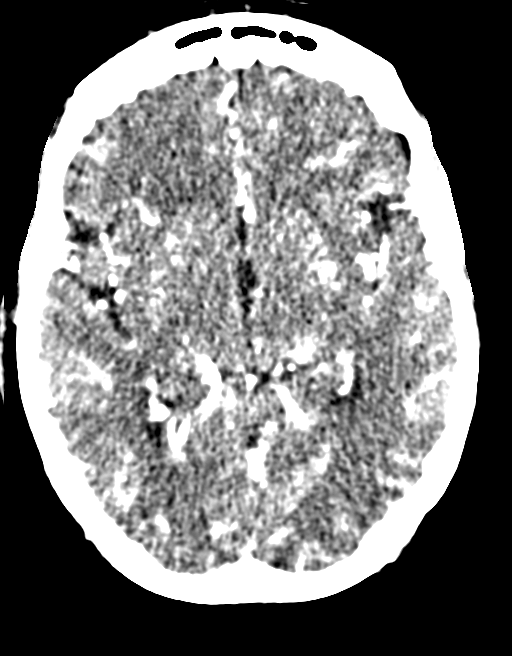
[im 83/156  bone]
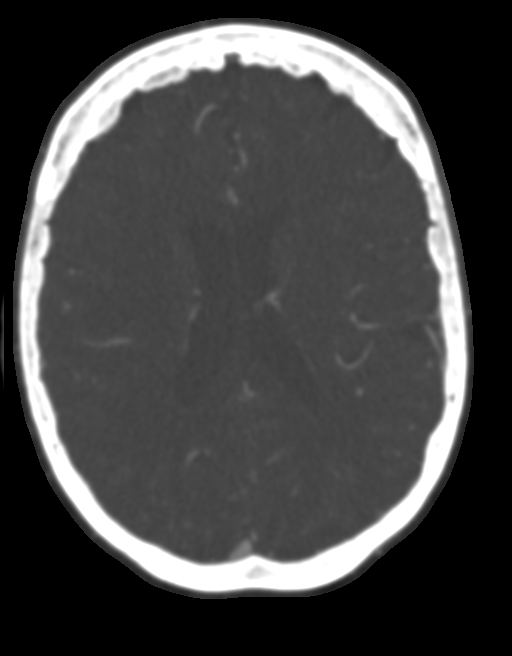
[im 94/156  brain]
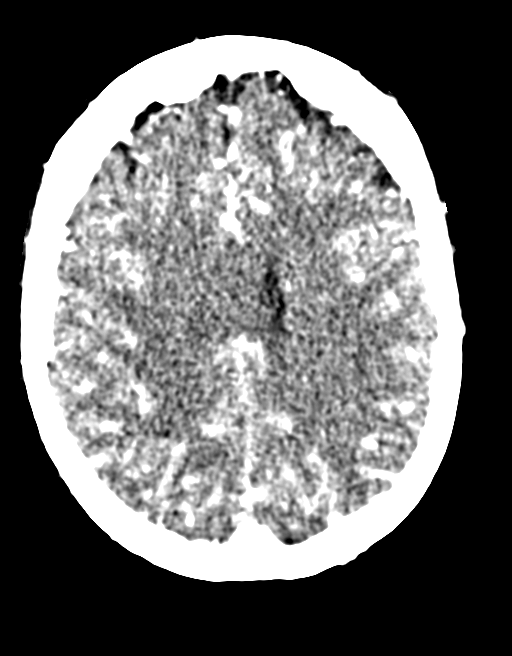
[im 104/156  bone]
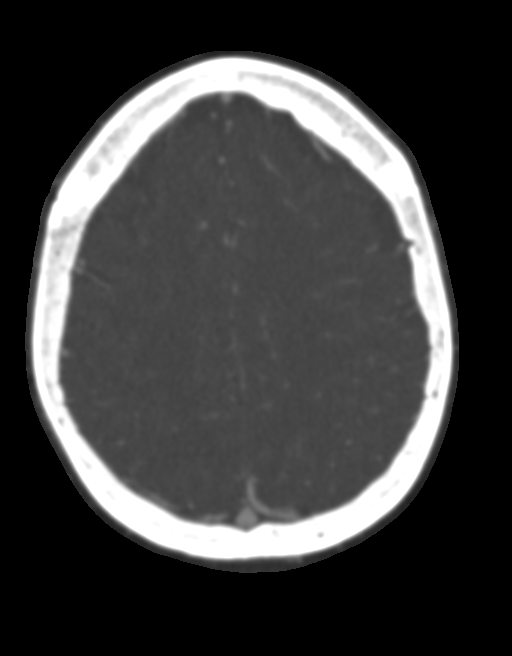
[im 114/156  brain]
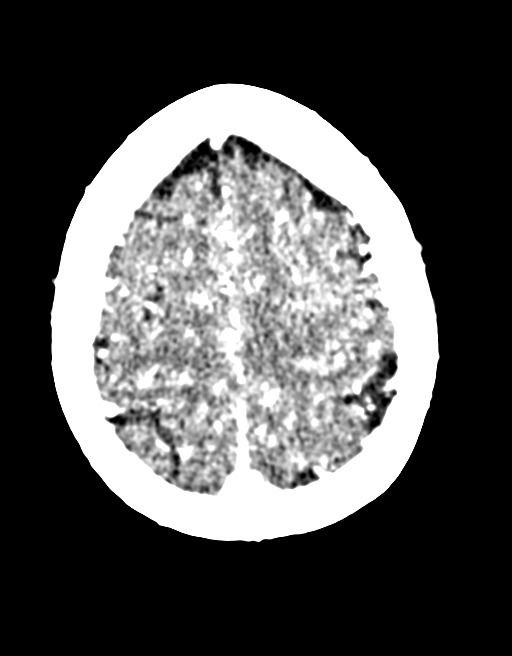
[im 135/156  bone]
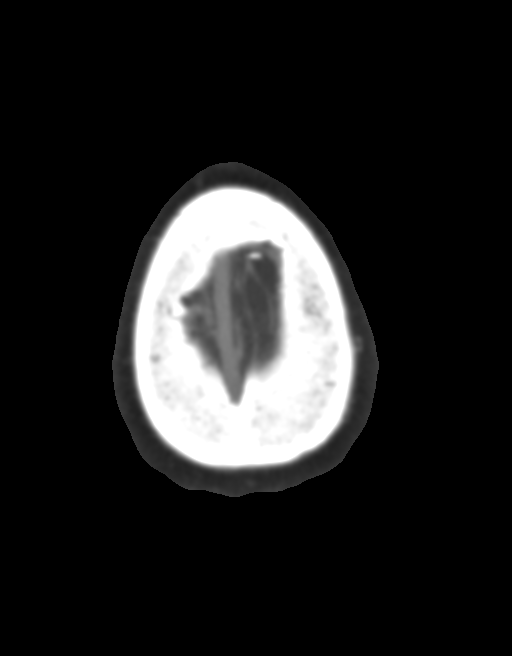
[im 145/156  brain]
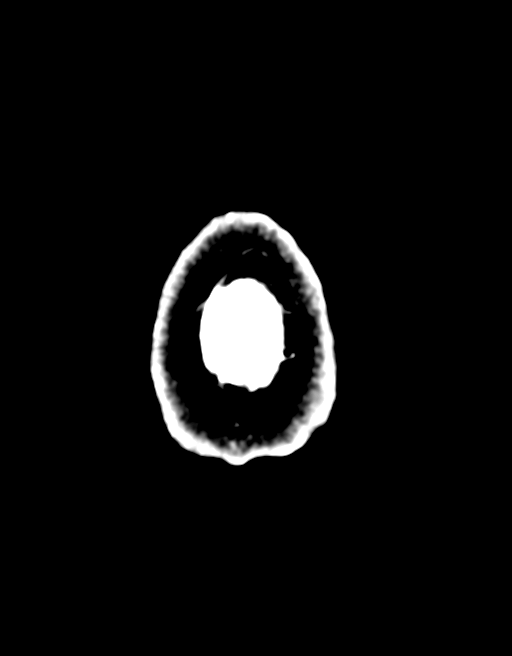

[Series 8: sagittal · sagittal · 0.30mm/px · 3 of 158 slices shown]
[im 117/158  brain]
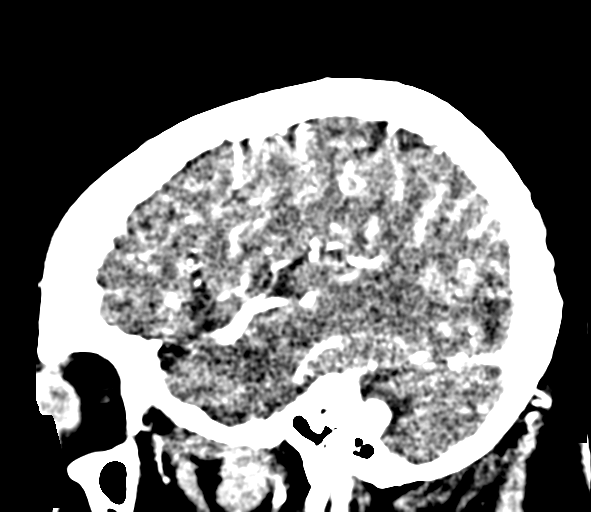
[im 127/158  brain]
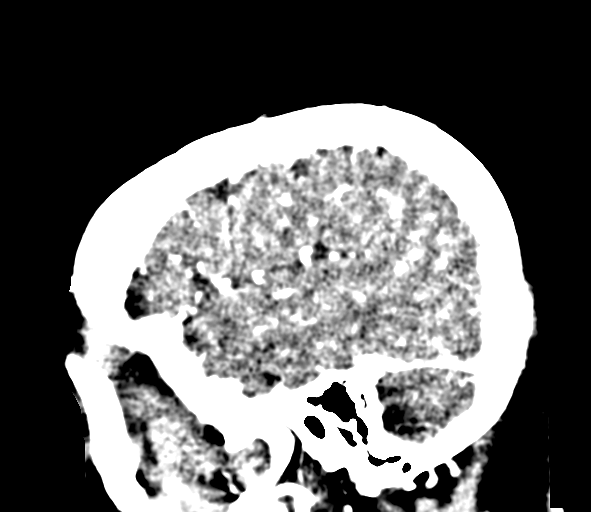
[im 137/158  brain]
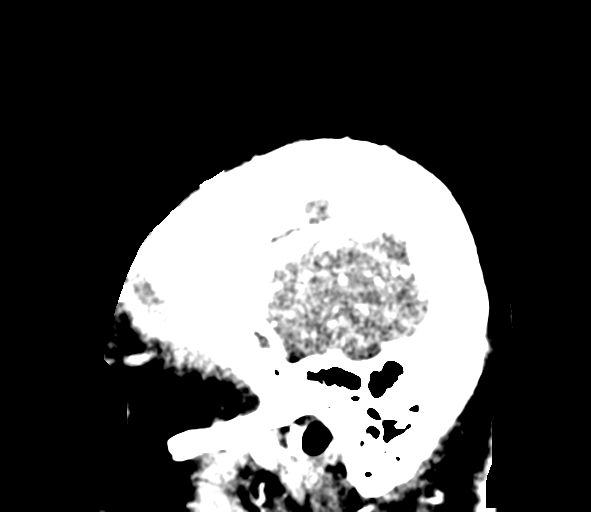

[14 of 37 positions shown; findings below may reference images not displayed]

FINDINGS: ANTERIOR CIRCULATION:

The visualized portions of the distal cervical segments of the
internal carotid arteries are symmetric in caliber and well
opacified. The petrous segments are normal bilaterally.

Mild calcified atheromatous disease present within the cavernous
segment of the left internal carotid artery without hemodynamically
significant stenosis. Supra clinoid left ICA is within normal
limits.

On the right, a large wide necked peripherally calcified aneurysm
measuring approximately 1.0 (craniocaudad) x 1.1 (AP) x
(transverse) cm is seen arising from the anterior genu of the
cavernous segment of the right ICA (series 5, image 24). The neck of
the aneurysm measures approximately 6 x 6 x 6 mm. The aneurysm is
directed superiorly and medially. Distally, the supra clinoid
segment of the right ICA is within normal limits.

A1 segments are well opacified bilaterally. Anterior communicating
artery is normal. Anterior cerebral arteries are well opacified
without abnormality.

M1 segments are well opacified bilaterally without proximal branch
occlusion or hemodynamically significant stenosis. No aneurysm seen
at the MCA bifurcations. Distal MCA branches are symmetric
bilaterally.

POSTERIOR CIRCULATION:

The left vertebral artery is dominant. The right vertebral artery
appears to terminate in the right posterior inferior cerebellar
artery. The posterior inferior cerebellar arteries themselves are
symmetric in caliber and normal in appearance. The vertebrobasilar
junction and basilar artery are within normal limits. No basilar tip
aneurysm. Posterior cerebral arteries are normal. A small left
posterior communicating artery is noted. Superior cerebellar
arteries are within normal limits. Probable dominant left anterior
inferior cerebellar artery.

No aneurysm seen within the posterior circulation.

Review of the MIP images confirms the above findings.

Osseous excrescence measuring approximately 1.1 x 0.6 cm seen at the
posterior aspect of the left petrous apex again noted, which may
reflect a a calcified meningioma. Addition and exophytic
calcification arising from the lateral aspect of the left supra
clinoid process measures 1.3 x 0.4 cm, also suspicious for
meningioma.

Patchy supratentorial white matter disease again noted. No large
vessel territory infarct or hemorrhage. No midline shift or
hydrocephalus. No extra-axial fluid collection.
IMPRESSION: 1. 1.0 x 1.1 x 1.1 cm wide necked peripherally calcified aneurysm
arising from the anterior genu of the cavernous segment of the right
internal carotid artery (para ophthalmic).
2. Stable probable calcified meningiomas as above.
3. No acute intracranial process.

## 2016-02-03 IMAGING — CT CT HEAD W/O CM
2 series · 15 of 30 positions shown, 19 images · non-contrast
Comparison: None.

CLINICAL DATA: Dizziness and vomiting

EXAM:
CT HEAD WITHOUT CONTRAST
TECHNIQUE: Contiguous axial images were obtained from the base of the skull
through the vertex without intravenous contrast.

[Series 2: head w/o · axial · non-contrast · 0.43mm/px · z∈[-151,-21]mm · 13 of 32 slices shown, 17 images]
[im 3/32  brain]
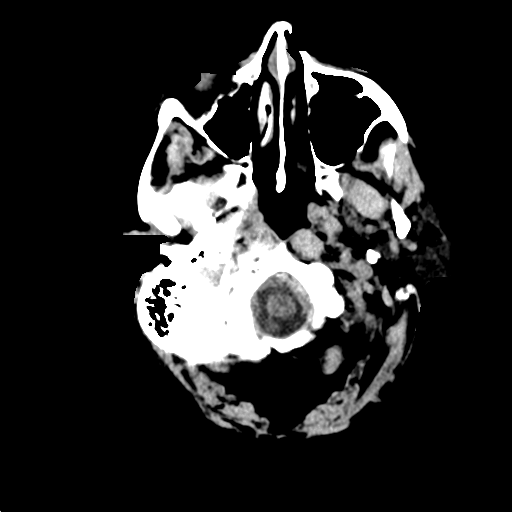
[im 3/32  bone]
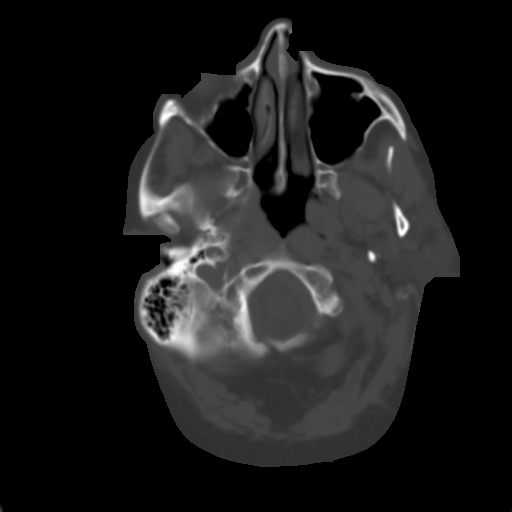
[im 5/32  brain]
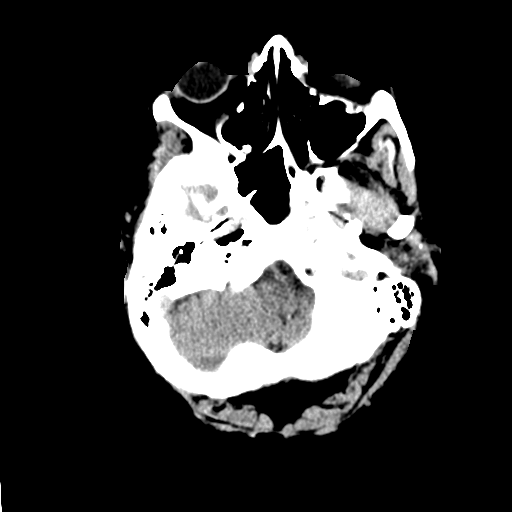
[im 7/32  brain]
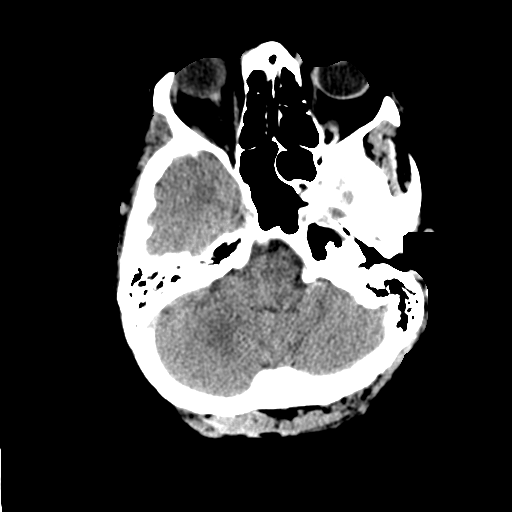
[im 9/32  brain]
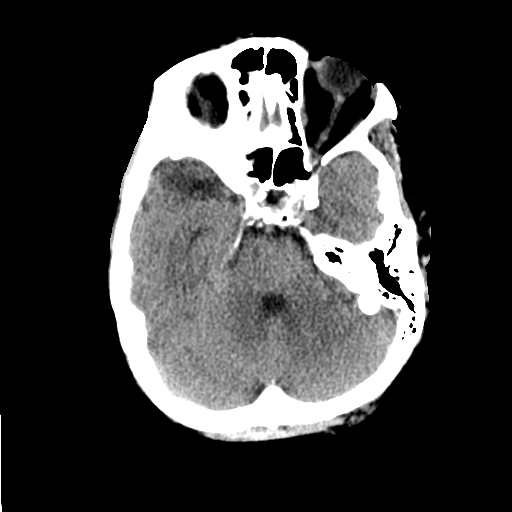
[im 12/32  brain]
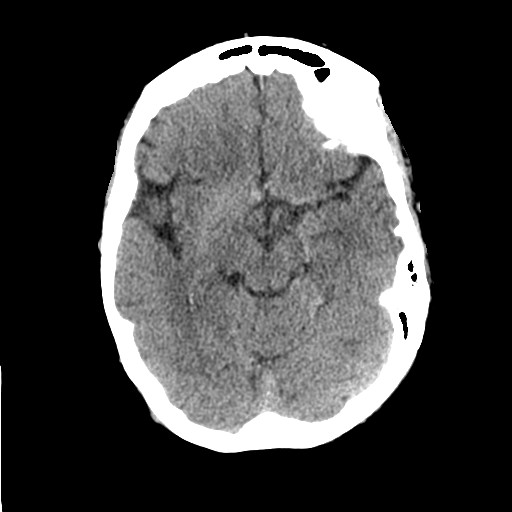
[im 12/32  bone]
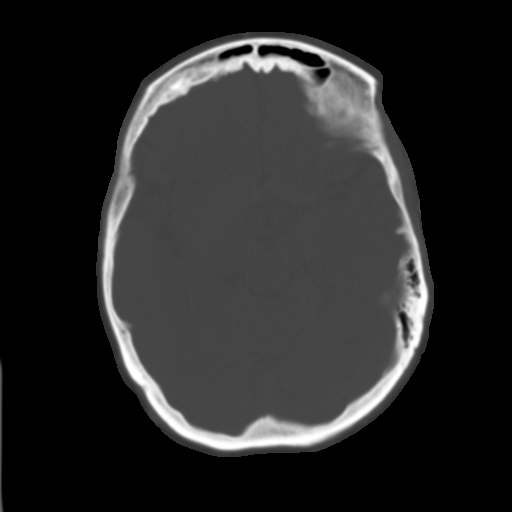
[im 14/32  brain]
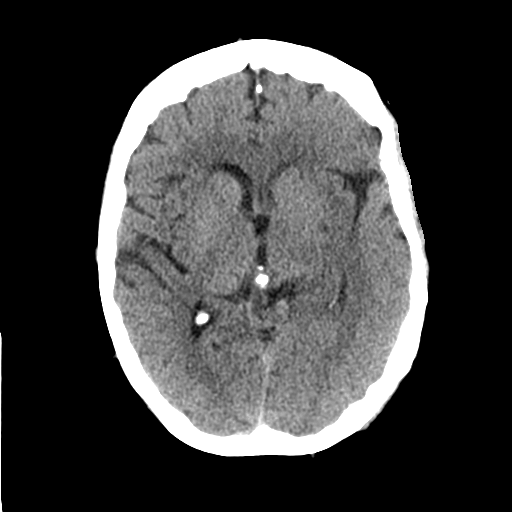
[im 16/32  brain]
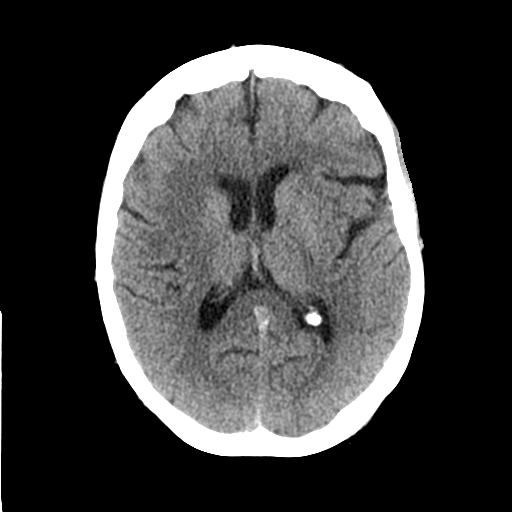
[im 18/32  brain]
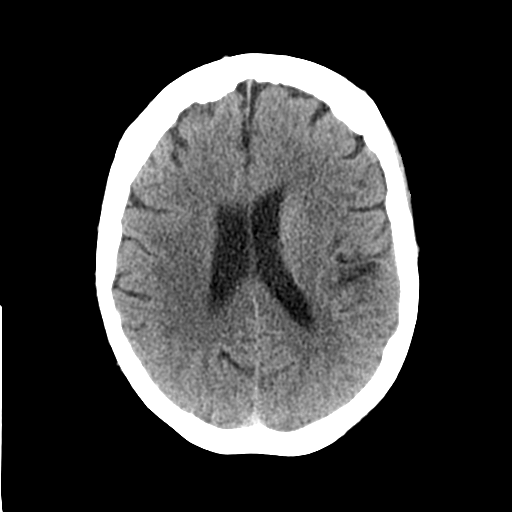
[im 20/32  brain]
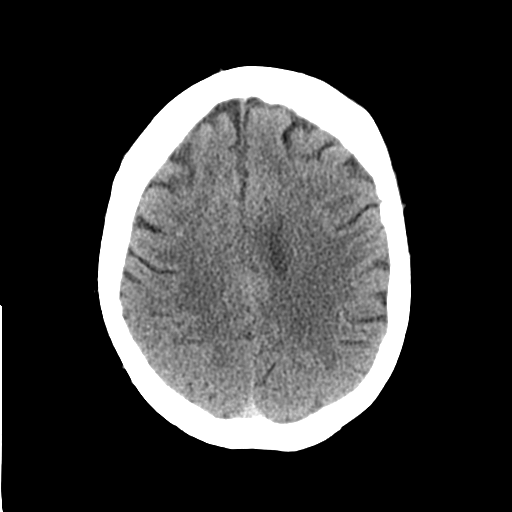
[im 20/32  bone]
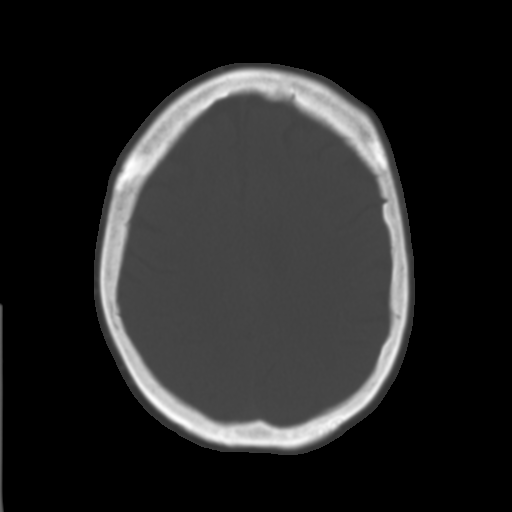
[im 23/32  brain]
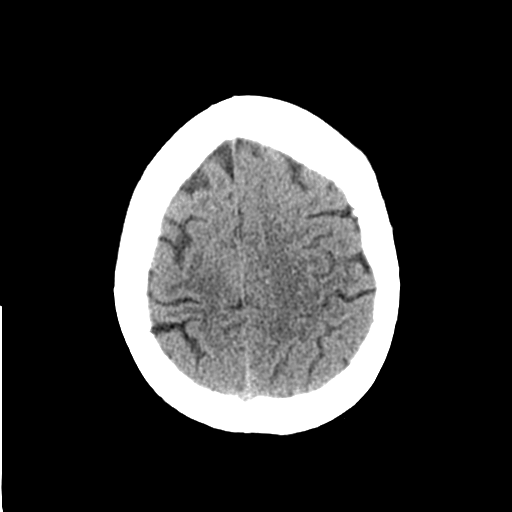
[im 25/32  brain]
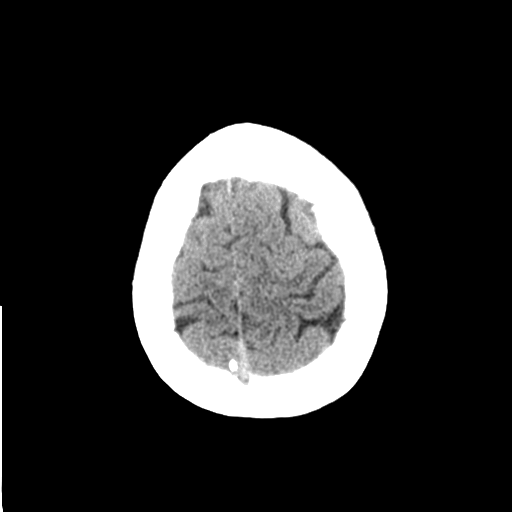
[im 27/32  brain]
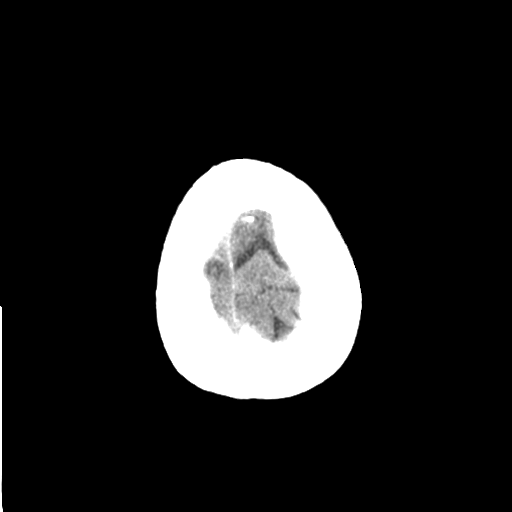
[im 29/32  brain]
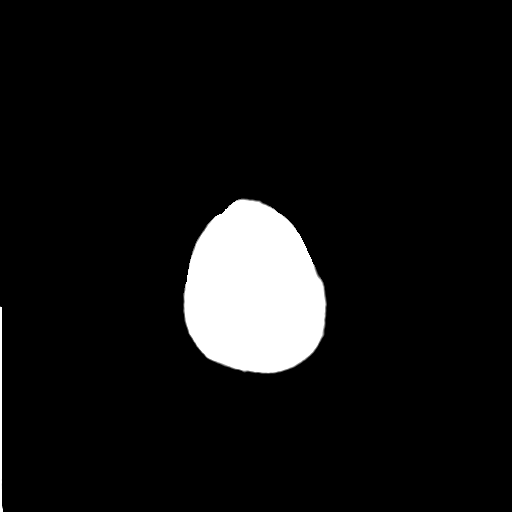
[im 29/32  bone]
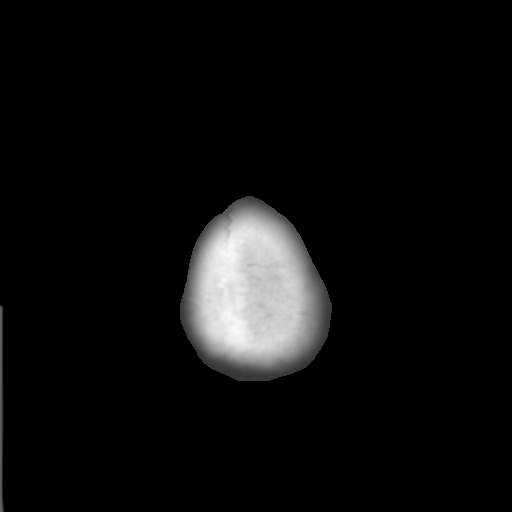

[Series 3: bone windows · axial · 0.43mm/px · z∈[-151,-131]mm · 2 of 32 slices shown]
[im 3/32  bone]
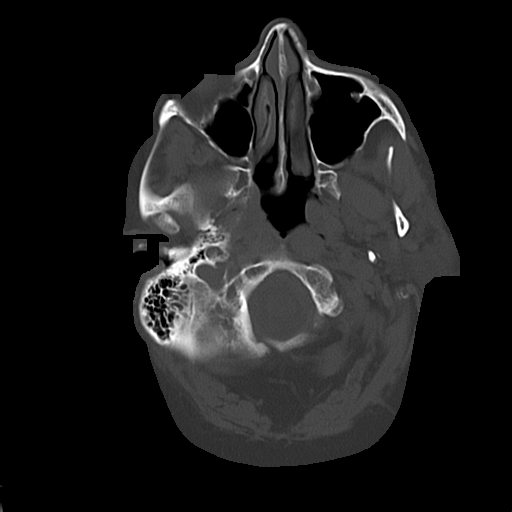
[im 7/32  bone]
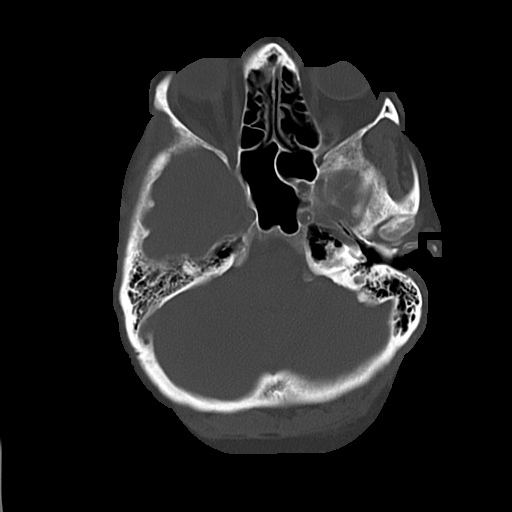

[15 of 30 positions shown; findings below may reference images not displayed]

FINDINGS: The ventricles and sulci are normal for age. No intraparenchymal
hemorrhage, mass effect nor midline shift. Patchy supratentorial
white matter hypodensities are within normal range for patient's age
and though non-specific suggest sequelae of chronic small vessel
ischemic disease. No acute large vascular territory infarcts.

No abnormal extra-axial fluid collections. Basal cisterns are
patent. Moderate calcific atherosclerosis of the carotid siphons
superimposed brown 10 mm peripherally calcified mass extending from
the anterior genu of the right carotid artery. In addition,
subcentimeter exophytic calcification from the anterior clinoid on
the left and 8 mm bony excrescence from the posterior aspect of left
petrous apex which may reflect meningiomas. Cerebellar tonsils at
but not below the foramen magnum.

No skull fracture. The included ocular globes and orbital contents
are non-suspicious. The mastoid aircells and included paranasal
sinuses are well-aerated.
IMPRESSION: No acute intracranial process.

10 mm peripherally calcified mass extending from the anterior genu
of the right carotid artery likely reflects aneurysm and would be
better characterized on CT angiogram of the head.

At least 2 subcentimeter probable meningiomas.

Findings discussed with and reconfirmed by Dr.FRANKI SWANSON
on08/20/2013at[DATE].

  By: Drenn Rt

## 2016-02-08 NOTE — Assessment & Plan Note (Signed)
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 Patient denies any hot flashes or myalgias 2. Bone density March 2016: T score -1.5  Breast cancer surveillance: 1. Breast exam 02/09/16 is normal 2. Mammograms 08/21/15: Breast density category B, normal  RTC 1 yr

## 2016-02-09 ENCOUNTER — Ambulatory Visit (HOSPITAL_BASED_OUTPATIENT_CLINIC_OR_DEPARTMENT_OTHER): Payer: Medicare PPO | Admitting: Hematology and Oncology

## 2016-02-09 ENCOUNTER — Other Ambulatory Visit: Payer: Self-pay | Admitting: Emergency Medicine

## 2016-02-09 VITALS — BP 132/49 | HR 99 | Temp 97.9°F | Resp 18 | Ht 63.5 in | Wt 198.9 lb

## 2016-02-09 DIAGNOSIS — Z780 Asymptomatic menopausal state: Secondary | ICD-10-CM

## 2016-02-09 DIAGNOSIS — Z170 Estrogen receptor positive status [ER+]: Secondary | ICD-10-CM | POA: Diagnosis not present

## 2016-02-09 DIAGNOSIS — C50112 Malignant neoplasm of central portion of left female breast: Secondary | ICD-10-CM | POA: Diagnosis not present

## 2016-02-09 DIAGNOSIS — E2839 Other primary ovarian failure: Secondary | ICD-10-CM

## 2016-02-09 NOTE — Progress Notes (Signed)
Patient Care Team: Lucille Passy, MD as PCP - General (Family Medicine) Fanny Skates, MD as Consulting Physician (General Surgery) Thea Silversmith, MD as Consulting Physician (Radiation Oncology) Nicholas Lose, MD as Consulting Physician (Hematology and Oncology) Garvin Fila, MD as Consulting Physician (Neurology) Consuella Lose, MD as Consulting Physician (Neurosurgery) Katy Apo, MD as Consulting Physician (Ophthalmology)  DIAGNOSIS:  Encounter Diagnoses  Name Primary?  . Malignant neoplasm of central portion of left breast in female, estrogen receptor positive ([place])   . Post-menopausal Yes    SUMMARY OF ONCOLOGIC HISTORY:   Cancer of central portion of left female breast ([place])   08/20/2013 Initial Diagnosis    Left breast 12:00: DCIS with calcifications, yet 100%, PR 100%; 09/04/2013 second group of calcifications also biopsied proven to be DCIS ER/PR positive      09/19/2013 Surgery    Left breast lumpectomy DCIS 2 foci margins negative, 0.2 and 0.3 cm ER 100% PR 100%      10/08/2013 - 11/12/2013 Radiation Therapy    Adjuvant radiation therapy      11/12/2013 -  Anti-estrogen oral therapy    Anastrozole 1 mg daily plan is for 5 years      01/09/2014 - 01/10/2014 Hospital Admission    Pipeline embolization 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 She denies any lumps or nodules  REVIEW OF SYSTEMS:   Constitutional: Denies fevers, chills or abnormal weight loss Eyes: Denies blurriness of vision Ears, nose, mouth, throat, and face: Denies mucositis or sore throat Respiratory: Denies cough, dyspnea or wheezes Cardiovascular: Denies palpitation, chest discomfort Gastrointestinal:  Denies nausea, heartburn or change in bowel habits Skin: Denies abnormal skin rashes Lymphatics: Denies new lymphadenopathy or  easy bruising Neurological:Denies numbness, tingling or new weaknesses Behavioral/Psych: Mood is stable, no new changes  Extremities: No lower extremity edema Breast: denies any pain or lumps or nodules in either breasts All other systems were reviewed with the patient and are negative.  I have reviewed the past medical history, past surgical history, social history and family history with the patient and they are unchanged from previous note.  ALLERGIES:  has No Known Allergies.  MEDICATIONS:  Current Outpatient Prescriptions  Medication Sig Dispense Refill  . acetaminophen (TYLENOL) 325 MG tablet Take 650 mg by mouth daily as needed (pain).    [name] anastrozole (ARIMIDEX) 1 MG tablet Take 1 tablet (1 mg total) by mouth daily. 90 tablet 3  . aspirin 325 MG tablet Take 325 mg by mouth daily.    [name] glucose blood (TRUE METRIX BLOOD GLUCOSE TEST) test strip Use as instructed to test blood sugar once daily E08.638 100 each 2  . lisinopril (PRINIVIL,ZESTRIL) 20 MG tablet Take 1 tablet (20 mg total) by mouth daily. 90 tablet 3  . pravastatin (PRAVACHOL) 20 MG tablet TAKE 1 TABLET (20 MG TOTAL) BY MOUTH DAILY. 90 tablet 3  . ranitidine (ZANTAC) 150 MG capsule Take 150 mg by mouth 2 (two) times daily.     No current facility-administered medications for this visit.     PHYSICAL EXAMINATION: ECOG PERFORMANCE STATUS: 0  Vitals:   02/09/16 1539  BP: (!) 132/49  Pulse: 99  Resp: 18  Temp: 97.9 F (36.6 C)   Filed Weights   02/09/16 1539  Weight: 198 lb 14.4 oz (90.2  kg)    GENERAL:alert, no distress and comfortable SKIN: skin color, texture, turgor are normal, no rashes or significant lesions EYES: normal, Conjunctiva are pink and non-injected, sclera clear OROPHARYNX:no exudate, no erythema and lips, buccal mucosa, and tongue normal  NECK: supple, thyroid normal size, non-tender, without nodularity LYMPH:  no palpable lymphadenopathy in the cervical, axillary or inguinal LUNGS: clear  to auscultation and percussion with normal breathing effort HEART: regular rate & rhythm and no murmurs and no lower extremity edema ABDOMEN:abdomen soft, non-tender and normal bowel sounds MUSCULOSKELETAL:no cyanosis of digits and no clubbing  NEURO: alert & oriented x 3 with fluent speech, no focal motor/sensory deficits EXTREMITIES: No lower extremity edema BREASTNo palpable masses or nodules in either right or left breasts. No palpable axillary supraclavicular or infraclavicular adenopathy no breast tenderness or nipple discharge. (exam performed in the presence of a chaperone)  LABORATORY DATA:  I have reviewed the data as listed   Chemistry      Component Value Date/Time   NA 138 12/23/2015 1130   K 4.8 12/23/2015 1130   CL 103 12/23/2015 1130   CO2 25 12/23/2015 1130   BUN 24 (H) 12/23/2015 1130   CREATININE 1.51 (H) 12/23/2015 1130      Component Value Date/Time   CALCIUM 9.8 12/23/2015 1130   ALKPHOS 75 10/29/2015 0909   AST 15 10/29/2015 0909   ALT 12 10/29/2015 0909   BILITOT 0.4 10/29/2015 0909       Lab Results  Component Value Date   WBC 12.0 (H) 10/28/2014   HGB 16.0 (H) 10/28/2014   HCT 47.3 (H) 10/28/2014   MCV 90.3 10/28/2014   PLT 304.0 10/28/2014   NEUTROABS 8.3 (H) 10/28/2014    ASSESSMENT & PLAN:  Cancer of central portion of left female breast DCIS left breast ER 100%, P 100% diagnosis 08/20/2013 status post lumpectomy 09/19/2013; 2 foci measuring 0.2 and 0.3 cm, status post adjuvant radiation therapy, currently on Arimidex since October 2015  Arimidex toxicities: 1. Patient denies any hot flashes or myalgias 2. Bone density March 2016: T score -1.5  Breast cancer surveillance: 1. Breast exam 02/09/16 is normal 2. Mammograms 08/21/15: Breast density category B, normal  RTC 1 yr   No orders of the defined types were placed in this encounter.  The patient has a good understanding of the overall plan. she agrees with it. she will call with  any problems that may develop before the next visit here.   Rulon Eisenmenger, MD 02/09/16

## 2016-02-12 ENCOUNTER — Encounter (HOSPITAL_BASED_OUTPATIENT_CLINIC_OR_DEPARTMENT_OTHER): Payer: Medicare PPO

## 2016-02-29 ENCOUNTER — Encounter (HOSPITAL_BASED_OUTPATIENT_CLINIC_OR_DEPARTMENT_OTHER): Payer: Medicare PPO | Attending: Internal Medicine

## 2016-02-29 DIAGNOSIS — L97211 Non-pressure chronic ulcer of right calf limited to breakdown of skin: Secondary | ICD-10-CM | POA: Insufficient documentation

## 2016-02-29 DIAGNOSIS — T8189XA Other complications of procedures, not elsewhere classified, initial encounter: Secondary | ICD-10-CM | POA: Insufficient documentation

## 2016-02-29 DIAGNOSIS — Y838 Other surgical procedures as the cause of abnormal reaction of the patient, or of later complication, without mention of misadventure at the time of the procedure: Secondary | ICD-10-CM | POA: Insufficient documentation

## 2016-02-29 DIAGNOSIS — I739 Peripheral vascular disease, unspecified: Secondary | ICD-10-CM | POA: Insufficient documentation

## 2016-02-29 DIAGNOSIS — I10 Essential (primary) hypertension: Secondary | ICD-10-CM | POA: Diagnosis not present

## 2016-02-29 DIAGNOSIS — Z87891 Personal history of nicotine dependence: Secondary | ICD-10-CM | POA: Insufficient documentation

## 2016-02-29 DIAGNOSIS — E11622 Type 2 diabetes mellitus with other skin ulcer: Secondary | ICD-10-CM | POA: Insufficient documentation

## 2016-02-29 DIAGNOSIS — Z853 Personal history of malignant neoplasm of breast: Secondary | ICD-10-CM | POA: Insufficient documentation

## 2016-02-29 DIAGNOSIS — I129 Hypertensive chronic kidney disease with stage 1 through stage 4 chronic kidney disease, or unspecified chronic kidney disease: Secondary | ICD-10-CM | POA: Insufficient documentation

## 2016-02-29 DIAGNOSIS — E1122 Type 2 diabetes mellitus with diabetic chronic kidney disease: Secondary | ICD-10-CM | POA: Insufficient documentation

## 2016-02-29 DIAGNOSIS — N183 Chronic kidney disease, stage 3 (moderate): Secondary | ICD-10-CM | POA: Insufficient documentation

## 2016-02-29 DIAGNOSIS — E1151 Type 2 diabetes mellitus with diabetic peripheral angiopathy without gangrene: Secondary | ICD-10-CM | POA: Insufficient documentation

## 2016-02-29 DIAGNOSIS — L97929 Non-pressure chronic ulcer of unspecified part of left lower leg with unspecified severity: Secondary | ICD-10-CM | POA: Diagnosis not present

## 2016-02-29 DIAGNOSIS — Z85820 Personal history of malignant melanoma of skin: Secondary | ICD-10-CM | POA: Insufficient documentation

## 2016-02-29 DIAGNOSIS — L97811 Non-pressure chronic ulcer of other part of right lower leg limited to breakdown of skin: Secondary | ICD-10-CM | POA: Insufficient documentation

## 2016-03-07 DIAGNOSIS — L97211 Non-pressure chronic ulcer of right calf limited to breakdown of skin: Secondary | ICD-10-CM | POA: Diagnosis not present

## 2016-03-07 DIAGNOSIS — I10 Essential (primary) hypertension: Secondary | ICD-10-CM | POA: Diagnosis not present

## 2016-03-07 DIAGNOSIS — T8189XA Other complications of procedures, not elsewhere classified, initial encounter: Secondary | ICD-10-CM | POA: Diagnosis not present

## 2016-03-07 DIAGNOSIS — I739 Peripheral vascular disease, unspecified: Secondary | ICD-10-CM | POA: Diagnosis not present

## 2016-03-07 DIAGNOSIS — E11622 Type 2 diabetes mellitus with other skin ulcer: Secondary | ICD-10-CM | POA: Diagnosis not present

## 2016-03-14 ENCOUNTER — Encounter (HOSPITAL_BASED_OUTPATIENT_CLINIC_OR_DEPARTMENT_OTHER): Payer: Medicare PPO | Attending: Internal Medicine

## 2016-03-14 DIAGNOSIS — I10 Essential (primary) hypertension: Secondary | ICD-10-CM | POA: Insufficient documentation

## 2016-03-14 DIAGNOSIS — T8189XA Other complications of procedures, not elsewhere classified, initial encounter: Secondary | ICD-10-CM | POA: Diagnosis not present

## 2016-03-14 DIAGNOSIS — E119 Type 2 diabetes mellitus without complications: Secondary | ICD-10-CM | POA: Diagnosis not present

## 2016-03-14 DIAGNOSIS — Y838 Other surgical procedures as the cause of abnormal reaction of the patient, or of later complication, without mention of misadventure at the time of the procedure: Secondary | ICD-10-CM | POA: Diagnosis not present

## 2016-04-14 DIAGNOSIS — Z853 Personal history of malignant neoplasm of breast: Secondary | ICD-10-CM | POA: Diagnosis not present

## 2016-04-14 DIAGNOSIS — I671 Cerebral aneurysm, nonruptured: Secondary | ICD-10-CM | POA: Diagnosis not present

## 2016-04-14 DIAGNOSIS — Z6834 Body mass index (BMI) 34.0-34.9, adult: Secondary | ICD-10-CM | POA: Diagnosis not present

## 2016-04-14 DIAGNOSIS — I10 Essential (primary) hypertension: Secondary | ICD-10-CM | POA: Diagnosis not present

## 2016-04-14 DIAGNOSIS — Z9081 Acquired absence of spleen: Secondary | ICD-10-CM | POA: Diagnosis not present

## 2016-04-14 DIAGNOSIS — Z9889 Other specified postprocedural states: Secondary | ICD-10-CM | POA: Diagnosis not present

## 2016-04-14 DIAGNOSIS — E119 Type 2 diabetes mellitus without complications: Secondary | ICD-10-CM | POA: Diagnosis not present

## 2016-04-14 DIAGNOSIS — C4371 Malignant melanoma of right lower limb, including hip: Secondary | ICD-10-CM | POA: Diagnosis not present

## 2016-04-25 ENCOUNTER — Other Ambulatory Visit: Payer: Medicare PPO

## 2016-04-26 ENCOUNTER — Ambulatory Visit
Admission: RE | Admit: 2016-04-26 | Discharge: 2016-04-26 | Disposition: A | Discharge status: Home / Self Care | Payer: Medicare PPO | Source: Ambulatory Visit | Attending: Hematology and Oncology | Admitting: Hematology and Oncology

## 2016-04-26 DIAGNOSIS — Z780 Asymptomatic menopausal state: Secondary | ICD-10-CM | POA: Diagnosis not present

## 2016-04-26 DIAGNOSIS — M85852 Other specified disorders of bone density and structure, left thigh: Secondary | ICD-10-CM | POA: Diagnosis not present

## 2016-04-26 DIAGNOSIS — Z170 Estrogen receptor positive status [ER+]: Secondary | ICD-10-CM

## 2016-04-26 DIAGNOSIS — C50112 Malignant neoplasm of central portion of left female breast: Secondary | ICD-10-CM

## 2016-04-26 DIAGNOSIS — E2839 Other primary ovarian failure: Secondary | ICD-10-CM

## 2016-04-27 DIAGNOSIS — H35371 Puckering of macula, right eye: Secondary | ICD-10-CM | POA: Diagnosis not present

## 2016-04-27 DIAGNOSIS — H35351 Cystoid macular degeneration, right eye: Secondary | ICD-10-CM | POA: Diagnosis not present

## 2016-05-20 DIAGNOSIS — L821 Other seborrheic keratosis: Secondary | ICD-10-CM | POA: Diagnosis not present

## 2016-05-20 DIAGNOSIS — D235 Other benign neoplasm of skin of trunk: Secondary | ICD-10-CM | POA: Diagnosis not present

## 2016-05-20 DIAGNOSIS — L601 Onycholysis: Secondary | ICD-10-CM | POA: Diagnosis not present

## 2016-05-20 DIAGNOSIS — D485 Neoplasm of uncertain behavior of skin: Secondary | ICD-10-CM | POA: Diagnosis not present

## 2016-05-20 DIAGNOSIS — Z85820 Personal history of malignant melanoma of skin: Secondary | ICD-10-CM | POA: Diagnosis not present

## 2016-05-20 DIAGNOSIS — D225 Melanocytic nevi of trunk: Secondary | ICD-10-CM | POA: Diagnosis not present

## 2016-06-22 DIAGNOSIS — L905 Scar conditions and fibrosis of skin: Secondary | ICD-10-CM | POA: Diagnosis not present

## 2016-06-22 DIAGNOSIS — D485 Neoplasm of uncertain behavior of skin: Secondary | ICD-10-CM | POA: Diagnosis not present

## 2016-07-01 DIAGNOSIS — Z6834 Body mass index (BMI) 34.0-34.9, adult: Secondary | ICD-10-CM | POA: Diagnosis not present

## 2016-07-01 DIAGNOSIS — Z9081 Acquired absence of spleen: Secondary | ICD-10-CM | POA: Diagnosis not present

## 2016-07-01 DIAGNOSIS — E669 Obesity, unspecified: Secondary | ICD-10-CM | POA: Diagnosis not present

## 2016-07-01 DIAGNOSIS — E785 Hyperlipidemia, unspecified: Secondary | ICD-10-CM | POA: Diagnosis not present

## 2016-07-01 DIAGNOSIS — I10 Essential (primary) hypertension: Secondary | ICD-10-CM | POA: Diagnosis not present

## 2016-07-01 DIAGNOSIS — Z9049 Acquired absence of other specified parts of digestive tract: Secondary | ICD-10-CM | POA: Diagnosis not present

## 2016-09-30 ENCOUNTER — Other Ambulatory Visit: Payer: Self-pay | Admitting: Hematology and Oncology

## 2016-09-30 DIAGNOSIS — Z853 Personal history of malignant neoplasm of breast: Secondary | ICD-10-CM

## 2016-10-19 ENCOUNTER — Ambulatory Visit
Admission: RE | Admit: 2016-10-19 | Discharge: 2016-10-19 | Disposition: A | Discharge status: Home / Self Care | Payer: Medicare PPO | Source: Ambulatory Visit | Attending: Hematology and Oncology | Admitting: Hematology and Oncology

## 2016-10-19 DIAGNOSIS — Z853 Personal history of malignant neoplasm of breast: Secondary | ICD-10-CM

## 2016-10-19 DIAGNOSIS — R928 Other abnormal and inconclusive findings on diagnostic imaging of breast: Secondary | ICD-10-CM | POA: Diagnosis not present

## 2016-10-19 HISTORY — DX: Personal history of irradiation: Z92.3

## 2016-11-08 ENCOUNTER — Other Ambulatory Visit: Payer: Self-pay | Admitting: Family Medicine

## 2016-11-10 ENCOUNTER — Other Ambulatory Visit: Payer: Self-pay | Admitting: Hematology and Oncology

## 2016-12-03 ENCOUNTER — Other Ambulatory Visit: Payer: Self-pay | Admitting: Family Medicine

## 2016-12-05 NOTE — Telephone Encounter (Signed)
Last refill sent on 10.3 with instruction for pt to sched appt/denied/thx dmf

## 2016-12-19 ENCOUNTER — Encounter: Payer: Medicare PPO | Admitting: Family Medicine

## 2016-12-26 ENCOUNTER — Encounter: Payer: Medicare PPO | Admitting: Family Medicine

## 2017-01-02 ENCOUNTER — Encounter: Payer: Self-pay | Admitting: Family Medicine

## 2017-01-02 ENCOUNTER — Ambulatory Visit: Payer: Medicare PPO | Admitting: Family Medicine

## 2017-01-02 VITALS — BP 134/90 | HR 84 | Temp 98.3°F | Ht 64.0 in | Wt 191.4 lb

## 2017-01-02 DIAGNOSIS — Z01419 Encounter for gynecological examination (general) (routine) without abnormal findings: Secondary | ICD-10-CM | POA: Diagnosis not present

## 2017-01-02 DIAGNOSIS — I10 Essential (primary) hypertension: Secondary | ICD-10-CM

## 2017-01-02 DIAGNOSIS — Z23 Encounter for immunization: Secondary | ICD-10-CM

## 2017-01-02 DIAGNOSIS — E08638 Diabetes mellitus due to underlying condition with other oral complications: Secondary | ICD-10-CM

## 2017-01-02 DIAGNOSIS — Z0000 Encounter for general adult medical examination without abnormal findings: Secondary | ICD-10-CM | POA: Diagnosis not present

## 2017-01-02 DIAGNOSIS — Z170 Estrogen receptor positive status [ER+]: Secondary | ICD-10-CM | POA: Diagnosis not present

## 2017-01-02 DIAGNOSIS — C50112 Malignant neoplasm of central portion of left female breast: Secondary | ICD-10-CM

## 2017-01-02 LAB — COMPREHENSIVE METABOLIC PANEL
ALT: 9 U/L (ref 0–35)
AST: 14 U/L (ref 0–37)
Albumin: 4.2 g/dL (ref 3.5–5.2)
Alkaline Phosphatase: 88 U/L (ref 39–117)
BUN: 34 mg/dL — ABNORMAL HIGH (ref 6–23)
CO2: 27 mEq/L (ref 19–32)
Calcium: 10 mg/dL (ref 8.4–10.5)
Chloride: 105 mEq/L (ref 96–112)
Creatinine, Ser: 1.37 mg/dL — ABNORMAL HIGH (ref 0.40–1.20)
GFR: 39.78 mL/min — ABNORMAL LOW (ref >60.00)
Glucose, Bld: 98 mg/dL (ref 70–99)
Potassium: 5.1 mEq/L (ref 3.5–5.1)
Sodium: 142 mEq/L (ref 135–145)
Total Bilirubin: 0.3 mg/dL (ref 0.2–1.2)
Total Protein: 7 g/dL (ref 6.0–8.3)

## 2017-01-02 LAB — CBC
HCT: 47.2 % — ABNORMAL HIGH (ref 36.0–46.0)
Hemoglobin: 15.3 g/dL — ABNORMAL HIGH (ref 12.0–15.0)
MCHC: 32.3 g/dL (ref 30.0–36.0)
MCV: 93.3 fl (ref 78.0–100.0)
Platelets: 341 10*3/uL (ref 150.0–400.0)
RBC: 5.06 Mil/uL (ref 3.87–5.11)
RDW: 14.1 % (ref 11.5–15.5)
WBC: 11.2 10*3/uL — ABNORMAL HIGH (ref 4.0–10.5)

## 2017-01-02 LAB — LIPID PANEL
Cholesterol: 172 mg/dL (ref 0–200)
HDL: 66.5 mg/dL (ref >39.00)
LDL Cholesterol: 73 mg/dL (ref 0–99)
NonHDL: 105.17
Total CHOL/HDL Ratio: 3
Triglycerides: 160 mg/dL — ABNORMAL HIGH (ref 0.0–149.0)
VLDL: 32 mg/dL (ref 0.0–40.0)

## 2017-01-02 LAB — HEMOGLOBIN A1C: Hgb A1c MFr Bld: 6.8 % — ABNORMAL HIGH (ref 4.6–6.5)

## 2017-01-02 LAB — TSH: TSH: 2.31 u[IU]/mL (ref 0.35–4.50)

## 2017-01-02 MED ORDER — PRAVASTATIN SODIUM 20 MG PO TABS: 20.0000 mg | ORAL_TABLET | Freq: Every day | ORAL | 3 refills | Status: DC

## 2017-01-02 MED ORDER — LISINOPRIL 20 MG PO TABS: 20.0000 mg | ORAL_TABLET | Freq: Every day | ORAL | 3 refills | Status: DC

## 2017-01-02 NOTE — Assessment & Plan Note (Signed)
Mammogram UTD. On Arimidex

## 2017-01-02 NOTE — Progress Notes (Signed)
Subjective:   Patient ID: Briana Ochoa, female    DOB: [birth year], 76 y.o.   MRN: [id number]  Briana Ochoa is a pleasant 76 y.o. year old female who presents to clinic today with Annual Exam (Patient is here today for an annual exam. She had a vision exam 6 months ago. ) and follow up of chronic medical conditions on 01/02/2017  I have personally reviewed the Medicare Annual Wellness questionnaire and have noted 1. The patient's medical and social history 2. Their use of alcohol, tobacco or illicit drugs 3. Their current medications and supplements 4. The patient's functional ability including ADL's, fall risks, home safety risks and hearing or visual             impairment. 5. Diet and physical activities 6. Evidence for depression or mood disorders  End of life wishes discussed and updated in Social History.  The roster of all physicians providing medical care to patient - is listed in the CareTeams section of the chart.  Health Maintenance  Topic Date Due  . TETANUS/TDAP  06/12/1959  . FOOT EXAM  11/03/2016  . HEMOGLOBIN A1C  07/02/2017  . OPHTHALMOLOGY EXAM  07/02/2017  . INFLUENZA VACCINE  Completed  . DEXA SCAN  Completed  . PNA vac Low Risk Adult  Completed    Breast cancer- followed by Dr. Lindi Adie.  Last seen on 04/21/15- note reviewed. On arimedix. Mammogram 10/19/16 Has appointment to see Dr. Lindi Adie in 02/2017.   HTN- has been well controlled on on lisinopril 20 mg daily.  Denies any HA, blurred vision, CP or SOB.  No LE edema.  CKD III- due for labs. Lab Results  Component Value Date   CREATININE 1.51 (H) 12/23/2015   Felt to be secondary to NSAIDs.  Has not seen renal since last year.  Cr has been stable.     DM- due for a1c today. Lab Results  Component Value Date   HGBA1C 7.0 (H) 10/29/2015       Wt Readings from Last 3 Encounters:  01/02/17 191 lb 6.4 oz (86.8 kg)  02/09/16 198 lb 14.4 oz (90.2 kg)  12/28/15 198 lb (89.8 kg)     H/o CVA- on  pravachol.  Lab Results  Component Value Date   CHOL 140 10/29/2015   HDL 59.90 10/29/2015   LDLCALC 56 10/29/2015   TRIG 122.0 10/29/2015   CHOLHDL 2 10/29/2015   Lab Results  Component Value Date   WBC 12.0 (H) 10/28/2014   HGB 16.0 (H) 10/28/2014   HCT 47.3 (H) 10/28/2014   MCV 90.3 10/28/2014   PLT 304.0 10/28/2014    Lab Results  Component Value Date   TSH 3.69 10/29/2015     Current Outpatient Medications on File Prior to Visit  Medication Sig Dispense Refill  . acetaminophen (TYLENOL) 325 MG tablet Take 650 mg by mouth daily as needed (pain).    [name] anastrozole (ARIMIDEX) 1 MG tablet TAKE 1 TABLET EVERY DAY 90 tablet 3  . aspirin 325 MG tablet Take 325 mg by mouth daily.    [name] glucose blood (TRUE METRIX BLOOD GLUCOSE TEST) test strip Use as instructed to test blood sugar once daily E08.638 100 each 2   No current facility-administered medications on file prior to visit.     No Known Allergies  Past Medical History:  Diagnosis Date  . Arthritis    L knee, R shoulder  . Arthritis of knee   . Breast cancer ([place]) 2015  left breast  . Cancer ([place])    lt breast  . Chronic kidney disease    renal insuffic, Dr. Jaci Carrel- [place]  . Complication of anesthesia 1970's   difficult time waking- felt like she couldn't breathe  . GERD (gastroesophageal reflux disease)   . Heart murmur   . Hypertension   . 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 Malignant melanoma of lower leg, right ([place]) 12/28/2015  . Personal history of radiation therapy 2015  . Pre-diabetes    diet controlled  . Radiation 10/21/13-11/11/13   Left Breast  . Stroke ([place])    mild- during an angiogram, no deficits  . Vertigo     Past Surgical History:  Procedure Laterality Date  . BREAST BIOPSY     Left  . BREAST LUMPECTOMY Left 2015  . BREAST LUMPECTOMY WITH NEEDLE LOCALIZATION Left 09/19/2013   Procedure: LEFT BREAST  LUMPECTOMY WITH DOUBLE WIRE BRACKETED  NEEDLE LOCALIZATION;  Surgeon: [name], MD;  Location: [facility];  Service: General;  Laterality: Left;  . CATARACT EXTRACTION, BILATERAL Bilateral 06/2015  . [place]  . CHOLECYSTECTOMY    . FINGER SURGERY     lt thumb  from dog bite  . MELANOMA EXCISION Right 12/28/2015   Procedure: EXCISION MELANOMA RIGHT LOWER LEG;  Surgeon: Fanny Skates, MD;  Location: [facility];  Service: General;  Laterality: Right;  . MULTIPLE TOOTH EXTRACTIONS     top palate- wears full plate  . RADIOLOGY WITH ANESTHESIA N/A 01/09/2014   Procedure: Embolization;  Surgeon: Consuella Lose, MD;  Location: [facility];  Service: Radiology;  Laterality: N/A;  . SPLENECTOMY, TOTAL    . TONSILLECTOMY      Family History  Problem Relation Age of Onset  . Cancer Mother        kidney  . Hypertension Mother   . Heart attack Paternal Grandfather   . Breast cancer Maternal Aunt 80  . Breast cancer Paternal Aunt 25    Social History   Socioeconomic History  . Marital status: Widowed    Spouse name: Not on file  . Number of children: Not on file  . Years of education: Not on file  . Highest education level: Not on file  Social Needs  . Financial resource strain: Not on file  . Food insecurity - worry: Not on file  . Food insecurity - inability: Not on file  . Transportation needs - medical: Not on file  . Transportation needs - non-medical: Not on file  Occupational History    Comment: Retired  Tobacco Use  . Smoking status: Former Smoker    Packs/day: 1.50    Types: Cigarettes    Last attempt to quit: 03/06/2003    Years since quitting: 13.8  . Smokeless tobacco: Never Used  Substance and Sexual Activity  . Alcohol use: No  . Drug use: No  . Sexual activity: No  Other Topics Concern  . Not on file  Social History Narrative   Widowed 2013   5 children   17 grandchildren      Does not have a living will-desires CPR, does not  want prolonged life support if futile.   The PMH, PSH, Social History, Family History, Medications, and allergies have been reviewed in [facility], and have been updated if relevant.  Review of Systems  Constitutional: Negative.   HENT: Negative.   Eyes: Negative.   Respiratory: Negative.   Cardiovascular:  Negative.   Gastrointestinal: Negative.   Endocrine: Negative.   Genitourinary: Negative.   Musculoskeletal: Negative.   Skin: Negative.   Allergic/Immunologic: Negative.   Neurological: Negative.   Hematological: Negative.   Psychiatric/Behavioral: Negative.   All other systems reviewed and are negative.     Objective:    BP 134/90 (BP Location: Left Arm, Patient Position: Sitting, Cuff Size: Normal)   Pulse 84   Temp 98.3 F (36.8 C) (Oral)   Ht 5\' 4"  (1.626 m)   Wt 191 lb 6.4 oz (86.8 kg)   SpO2 93%   BMI 32.85 kg/m   Wt Readings from Last 3 Encounters:  01/02/17 191 lb 6.4 oz (86.8 kg)  02/09/16 198 lb 14.4 oz (90.2 kg)  12/28/15 198 lb (89.8 kg)     Physical Exam    General:  Well-developed,well-nourished,in no acute distress; alert,appropriate and cooperative throughout examination Head:  normocephalic and atraumatic.   Eyes:  vision grossly intact, PERRL Ears:  R ear normal and L ear normal externally, TMs clear bilaterally Nose:  no external deformity.   Mouth:  good dentition.   Neck:  No deformities, masses, or tenderness noted. Breasts:  No mass, nodules, thickening, tenderness, bulging, retraction, inflamation, nipple discharge or skin changes noted.   Lungs:  Normal respiratory effort, chest expands symmetrically. Lungs are clear to auscultation, no crackles or wheezes. Heart:  Normal rate and regular rhythm. S1 and S2 normal without gallop, murmur, click, rub or other extra sounds. Abdomen:  Bowel sounds positive,abdomen soft and non-tender without masses, organomegaly or hernias noted. Msk:  No deformity or scoliosis noted of thoracic or lumbar  spine.   Extremities:  No clubbing, cyanosis, edema, or deformity noted with normal full range of motion of all joints.   Neurologic:  alert & oriented X3 and gait normal.   Skin:  Intact without suspicious lesions or rashes Cervical Nodes:  No lymphadenopathy noted Axillary Nodes:  No palpable lymphadenopathy Psych:  Cognition and judgment appear intact. Alert and cooperative with normal attention span and concentration. 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

## 2017-01-02 NOTE — Patient Instructions (Signed)
Great to see you. Happy Holidays!  We will call you with your results from today or you can view them online.

## 2017-01-02 NOTE — Assessment & Plan Note (Signed)
Well controlled. No changes made today. 

## 2017-01-02 NOTE — Assessment & Plan Note (Signed)
Has been diet controlled. Due for a1c today. On ACEI.

## 2017-01-02 NOTE — Assessment & Plan Note (Signed)
Reviewed preventive care protocols, scheduled due services, and updated immunizations Discussed nutrition, exercise, diet, and healthy lifestyle.  Orders Placed This Encounter  Procedures  . Hemoglobin A1c  . Comprehensive metabolic panel  . Lipid panel  . TSH  . CBC

## 2017-01-13 DIAGNOSIS — L570 Actinic keratosis: Secondary | ICD-10-CM | POA: Diagnosis not present

## 2017-01-13 DIAGNOSIS — L814 Other melanin hyperpigmentation: Secondary | ICD-10-CM | POA: Diagnosis not present

## 2017-01-13 DIAGNOSIS — D225 Melanocytic nevi of trunk: Secondary | ICD-10-CM | POA: Diagnosis not present

## 2017-01-13 DIAGNOSIS — L821 Other seborrheic keratosis: Secondary | ICD-10-CM | POA: Diagnosis not present

## 2017-01-13 DIAGNOSIS — Z85820 Personal history of malignant melanoma of skin: Secondary | ICD-10-CM | POA: Diagnosis not present

## 2017-01-13 DIAGNOSIS — D1801 Hemangioma of skin and subcutaneous tissue: Secondary | ICD-10-CM | POA: Diagnosis not present

## 2017-02-08 ENCOUNTER — Ambulatory Visit: Payer: Medicare PPO | Admitting: Hematology and Oncology

## 2017-02-09 NOTE — Assessment & Plan Note (Signed)
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 Patient denies any hot flashes or myalgias 2. Bone density March 2016: T score -1.5  Breast cancer surveillance: 1. Breast exam 02/10/17 is normal 2. Mammograms 08/21/15: Breast density category B, normal  RTC 1 yr

## 2017-02-10 ENCOUNTER — Ambulatory Visit: Payer: Medicare PPO | Admitting: Hematology and Oncology

## 2017-02-10 ENCOUNTER — Telehealth: Payer: Self-pay | Admitting: Hematology and Oncology

## 2017-02-10 DIAGNOSIS — C50112 Malignant neoplasm of central portion of left female breast: Secondary | ICD-10-CM

## 2017-02-10 DIAGNOSIS — D0512 Intraductal carcinoma in situ of left breast: Secondary | ICD-10-CM

## 2017-02-10 DIAGNOSIS — Z170 Estrogen receptor positive status [ER+]: Secondary | ICD-10-CM

## 2017-02-10 DIAGNOSIS — Z79811 Long term (current) use of aromatase inhibitors: Secondary | ICD-10-CM

## 2017-02-10 MED ORDER — ANASTROZOLE 1 MG PO TABS: 1.0000 mg | ORAL_TABLET | Freq: Every day | ORAL | 3 refills | Status: DC

## 2017-02-10 NOTE — Telephone Encounter (Signed)
Gave patient AVs and calendar of upcoming January 2019 appointments.

## 2017-02-10 NOTE — Progress Notes (Signed)
Patient Care Team: Lucille Passy, MD as PCP - General (Family Medicine) Fanny Skates, MD as Consulting Physician (General Surgery) Thea Silversmith, MD (Inactive) as Consulting Physician (Radiation Oncology) Nicholas Lose, MD as Consulting Physician (Hematology and Oncology) Garvin Fila, MD as Consulting Physician (Neurology) Consuella Lose, MD as Consulting Physician (Neurosurgery) Katy Apo, MD as Consulting Physician (Ophthalmology)  DIAGNOSIS:  Encounter Diagnosis  Name Primary?  . Malignant neoplasm of central portion of left breast in female, estrogen receptor positive ([place])     SUMMARY OF ONCOLOGIC HISTORY:   Cancer of central portion of left female breast ([place])   08/20/2013 Initial Diagnosis    Left breast 12:00: DCIS with calcifications, yet 100%, PR 100%; 09/04/2013 second group of calcifications also biopsied proven to be DCIS ER/PR positive      09/19/2013 Surgery    Left breast lumpectomy DCIS 2 foci margins negative, 0.2 and 0.3 cm ER 100% PR 100%      10/08/2013 - 11/12/2013 Radiation Therapy    Adjuvant radiation therapy      11/12/2013 -  Anti-estrogen oral therapy    Anastrozole 1 mg daily plan is for 5 years      01/09/2014 - 01/10/2014 Hospital Admission    Pipeline embolization 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  She appears to be tolerating extremely well.  She denies any hot flashes or myalgias.  She denies any lumps or nodules in the breast.  REVIEW OF SYSTEMS:   Constitutional: Denies fevers, chills or abnormal weight loss Eyes: Denies blurriness of vision Ears, nose, mouth, throat, and face: Denies mucositis or sore throat Respiratory: Denies cough, dyspnea or wheezes Cardiovascular: Denies palpitation, chest  discomfort Gastrointestinal:  Denies nausea, heartburn or change in bowel habits Skin: Denies abnormal skin rashes Lymphatics: Denies new lymphadenopathy or easy bruising Neurological:Denies numbness, tingling or new weaknesses Behavioral/Psych: Mood is stable, no new changes  Extremities: No lower extremity edema Breast:  denies any pain or lumps or nodules in either breasts All other systems were reviewed with the patient and are negative.  I have reviewed the past medical history, past surgical history, social history and family history with the patient and they are unchanged from previous note.  ALLERGIES:  has No Known Allergies.  MEDICATIONS:  Current Outpatient Medications  Medication Sig Dispense Refill  . acetaminophen (TYLENOL) 325 MG tablet Take 650 mg by mouth daily as needed (pain).    [name] anastrozole (ARIMIDEX) 1 MG tablet TAKE 1 TABLET EVERY DAY 90 tablet 3  . aspirin 325 MG tablet Take 325 mg by mouth daily.    [name] glucose blood (TRUE METRIX BLOOD GLUCOSE TEST) test strip Use as instructed to test blood sugar once daily E08.638 100 each 2  . lisinopril (PRINIVIL,ZESTRIL) 20 MG tablet Take 1 tablet (20 mg total) by mouth daily. 90 tablet 3  . pravastatin (PRAVACHOL) 20 MG tablet Take 1 tablet (20 mg total) by mouth daily. 90 tablet 3   No current facility-administered medications for this visit.     PHYSICAL EXAMINATION: ECOG PERFORMANCE STATUS: 0 - Asymptomatic  Vitals:   02/10/17 0939  BP: (!) 146/74  Pulse: 73  Resp: 20  Temp: 97.9 F (36.6 C)  SpO2: 96%   Filed Weights   02/10/17 0939  Weight: 196 lb (  88.9 kg)    GENERAL:alert, no distress and comfortable SKIN: skin color, texture, turgor are normal, no rashes or significant lesions EYES: normal, Conjunctiva are pink and non-injected, sclera clear OROPHARYNX:no exudate, no erythema and lips, buccal mucosa, and tongue normal  NECK: supple, thyroid normal size, non-tender, without nodularity LYMPH:  no  palpable lymphadenopathy in the cervical, axillary or inguinal LUNGS: clear to auscultation and percussion with normal breathing effort HEART: regular rate & rhythm and no murmurs and no lower extremity edema ABDOMEN:abdomen soft, non-tender and normal bowel sounds MUSCULOSKELETAL:no cyanosis of digits and no clubbing  NEURO: alert & oriented x 3 with fluent speech, no focal motor/sensory deficits EXTREMITIES: No lower extremity edema BREAST: No palpable masses or nodules in either right or left breasts. No palpable axillary supraclavicular or infraclavicular adenopathy no breast tenderness or nipple discharge. (exam performed in the presence of a chaperone)  LABORATORY DATA:  I have reviewed the data as listed   Chemistry      Component Value Date/Time   NA 142 01/02/2017 1229   K 5.1 01/02/2017 1229   CL 105 01/02/2017 1229   CO2 27 01/02/2017 1229   BUN 34 (H) 01/02/2017 1229   CREATININE 1.37 (H) 01/02/2017 1229      Component Value Date/Time   CALCIUM 10.0 01/02/2017 1229   ALKPHOS 88 01/02/2017 1229   AST 14 01/02/2017 1229   ALT 9 01/02/2017 1229   BILITOT 0.3 01/02/2017 1229       Lab Results  Component Value Date   WBC 11.2 (H) 01/02/2017   HGB 15.3 (H) 01/02/2017   HCT 47.2 (H) 01/02/2017   MCV 93.3 01/02/2017   PLT 341.0 01/02/2017   NEUTROABS 8.3 (H) 10/28/2014    ASSESSMENT & PLAN:  Cancer of central portion of left female breast DCIS left breast ER 100%, P 100% diagnosis 08/20/2013 status post lumpectomy 09/19/2013; 2 foci measuring 0.2 and 0.3 cm, status post adjuvant radiation therapy, currently on Arimidex since October 2015  Arimidex toxicities: 1. Patient denies any hot flashes or myalgias 2. Bone density March 2018: T score -1.5, no change compared to before  Breast cancer surveillance: 1. Breast exam 02/10/17 is normal 2. Mammograms 10/19/2016: Breast density category B, normal  RTC 1 yr  I spent 25 minutes talking to the patient of which  more than half was spent in counseling and coordination of care.  No orders of the defined types were placed in this encounter.  The patient has a good understanding of the overall plan. she agrees with it. she will call with any problems that may develop before the next visit here.   Harriette Ohara, MD 02/10/17

## 2017-07-11 DIAGNOSIS — Z85820 Personal history of malignant melanoma of skin: Secondary | ICD-10-CM | POA: Diagnosis not present

## 2017-07-11 DIAGNOSIS — D225 Melanocytic nevi of trunk: Secondary | ICD-10-CM | POA: Diagnosis not present

## 2017-07-11 DIAGNOSIS — L570 Actinic keratosis: Secondary | ICD-10-CM | POA: Diagnosis not present

## 2017-07-11 DIAGNOSIS — D1801 Hemangioma of skin and subcutaneous tissue: Secondary | ICD-10-CM | POA: Diagnosis not present

## 2017-07-11 DIAGNOSIS — L821 Other seborrheic keratosis: Secondary | ICD-10-CM | POA: Diagnosis not present

## 2017-09-28 ENCOUNTER — Ambulatory Visit (INDEPENDENT_AMBULATORY_CARE_PROVIDER_SITE_OTHER): Payer: Medicare PPO

## 2017-09-28 ENCOUNTER — Ambulatory Visit: Payer: Medicare PPO | Admitting: Family Medicine

## 2017-09-28 ENCOUNTER — Encounter: Payer: Self-pay | Admitting: Family Medicine

## 2017-09-28 VITALS — BP 118/70 | HR 91 | Temp 97.4°F | Ht 64.0 in | Wt 193.4 lb

## 2017-09-28 DIAGNOSIS — R531 Weakness: Secondary | ICD-10-CM

## 2017-09-28 DIAGNOSIS — R0609 Other forms of dyspnea: Secondary | ICD-10-CM

## 2017-09-28 DIAGNOSIS — E119 Type 2 diabetes mellitus without complications: Secondary | ICD-10-CM | POA: Diagnosis not present

## 2017-09-28 DIAGNOSIS — R319 Hematuria, unspecified: Secondary | ICD-10-CM

## 2017-09-28 DIAGNOSIS — R0600 Dyspnea, unspecified: Secondary | ICD-10-CM | POA: Insufficient documentation

## 2017-09-28 DIAGNOSIS — R197 Diarrhea, unspecified: Secondary | ICD-10-CM

## 2017-09-28 DIAGNOSIS — E08638 Diabetes mellitus due to underlying condition with other oral complications: Secondary | ICD-10-CM | POA: Diagnosis not present

## 2017-09-28 DIAGNOSIS — K921 Melena: Secondary | ICD-10-CM | POA: Insufficient documentation

## 2017-09-28 DIAGNOSIS — R300 Dysuria: Secondary | ICD-10-CM

## 2017-09-28 DIAGNOSIS — R17 Unspecified jaundice: Secondary | ICD-10-CM

## 2017-09-28 DIAGNOSIS — R0602 Shortness of breath: Secondary | ICD-10-CM | POA: Diagnosis not present

## 2017-09-28 DIAGNOSIS — C50112 Malignant neoplasm of central portion of left female breast: Secondary | ICD-10-CM | POA: Diagnosis not present

## 2017-09-28 DIAGNOSIS — R5383 Other fatigue: Secondary | ICD-10-CM

## 2017-09-28 DIAGNOSIS — Z170 Estrogen receptor positive status [ER+]: Secondary | ICD-10-CM

## 2017-09-28 HISTORY — DX: Unspecified jaundice: R17

## 2017-09-28 HISTORY — DX: Weakness: R53.1

## 2017-09-28 HISTORY — DX: Dyspnea, unspecified: R06.00

## 2017-09-28 HISTORY — DX: Other fatigue: R53.83

## 2017-09-28 LAB — COMPREHENSIVE METABOLIC PANEL
ALT: 8 U/L (ref 0–35)
AST: 11 U/L (ref 0–37)
Albumin: 3.9 g/dL (ref 3.5–5.2)
Alkaline Phosphatase: 81 U/L (ref 39–117)
BUN: 31 mg/dL — ABNORMAL HIGH (ref 6–23)
CO2: 27 mEq/L (ref 19–32)
Calcium: 9.7 mg/dL (ref 8.4–10.5)
Chloride: 105 mEq/L (ref 96–112)
Creatinine, Ser: 1.54 mg/dL — ABNORMAL HIGH (ref 0.40–1.20)
GFR: 34.69 mL/min — ABNORMAL LOW (ref >60.00)
Glucose, Bld: 118 mg/dL — ABNORMAL HIGH (ref 70–99)
Potassium: 4.7 mEq/L (ref 3.5–5.1)
Sodium: 140 mEq/L (ref 135–145)
Total Bilirubin: 0.3 mg/dL (ref 0.2–1.2)
Total Protein: 6.4 g/dL (ref 6.0–8.3)

## 2017-09-28 LAB — POCT URINALYSIS DIPSTICK
Bilirubin, UA: NEGATIVE
Glucose, UA: NEGATIVE
Ketones, UA: NEGATIVE
Leukocytes, UA: NEGATIVE
Nitrite, UA: NEGATIVE
Protein, UA: POSITIVE — AB
Spec Grav, UA: 1.02 (ref 1.010–1.025)
Urobilinogen, UA: 0.2 E.U./dL
pH, UA: 6 (ref 5.0–8.0)

## 2017-09-28 LAB — LIPASE: Lipase: 36 U/L (ref 11.0–59.0)

## 2017-09-28 LAB — CBC WITH DIFFERENTIAL/PLATELET
Basophils Absolute: 0.1 10*3/uL (ref 0.0–0.1)
Basophils Relative: 0.9 % (ref 0.0–3.0)
Eosinophils Absolute: 0.6 10*3/uL (ref 0.0–0.7)
Eosinophils Relative: 4.6 % (ref 0.0–5.0)
HCT: 34.1 % — ABNORMAL LOW (ref 36.0–46.0)
Hemoglobin: 11.3 g/dL — ABNORMAL LOW (ref 12.0–15.0)
Lymphocytes Relative: 17.4 % (ref 12.0–46.0)
Lymphs Abs: 2.1 10*3/uL (ref 0.7–4.0)
MCHC: 33 g/dL (ref 30.0–36.0)
MCV: 90.5 fl (ref 78.0–100.0)
Monocytes Absolute: 1.1 10*3/uL — ABNORMAL HIGH (ref 0.1–1.0)
Monocytes Relative: 9.4 % (ref 3.0–12.0)
Neutro Abs: 8.2 10*3/uL — ABNORMAL HIGH (ref 1.4–7.7)
Neutrophils Relative %: 67.7 % (ref 43.0–77.0)
Platelets: 374 10*3/uL (ref 150.0–400.0)
RBC: 3.77 Mil/uL — ABNORMAL LOW (ref 3.87–5.11)
RDW: 14.1 % (ref 11.5–15.5)
WBC: 12.1 10*3/uL — ABNORMAL HIGH (ref 4.0–10.5)

## 2017-09-28 LAB — FERRITIN: Ferritin: 23.5 ng/mL (ref 10.0–291.0)

## 2017-09-28 LAB — H. PYLORI ANTIBODY, IGG: H Pylori IgG: NEGATIVE

## 2017-09-28 LAB — HEMOGLOBIN A1C: Hgb A1c MFr Bld: 6.6 % — ABNORMAL HIGH (ref 4.6–6.5)

## 2017-09-28 MED ORDER — CEPHALEXIN 500 MG PO CAPS: 500.0000 mg | ORAL_CAPSULE | Freq: Two times a day (BID) | ORAL | 0 refills | Status: DC

## 2017-09-28 NOTE — Patient Instructions (Addendum)
Good to see you. Take Keflex 500 mg twice daily x 7 days.  I will call you with your results.  Let's also decrease your aspirin to 81 mg daily.

## 2017-09-28 NOTE — Assessment & Plan Note (Addendum)
Very complicated presentation with wide differential diagnosis- numerous symptoms in a patient with a history of breast cancer.  I am concerned that several of her symptoms including her black/bloody stools,  jaundice, weakness, shortness of breath, extreme fatigue could be indicative of anemia stemming from metastatic disease. Hematuria could also indicate a new malignancy, ie bladder cancer.   Discussed with patient potential of going to the ER as she most likely will need admission but she feels stable for me to start an outpatient work up and will go to the ER if anything worsens.  Stat labs, (including CMET, CBC, ferritin, lipase), CXR and CT of abdomen/pelvis ordered. Given dysuria with hematuria- will place on keflex to treat possible UTI (less likely) as awaiting urine cx. Given probable anemia with bloody stools- advised to decrease ASA to 81 mg daily (from 325 mg daily). The patient indicates understanding of these issues and agrees with the plan.

## 2017-09-28 NOTE — Progress Notes (Signed)
Subjective:   Patient ID: Briana Ochoa, female    DOB: 24-Mar-1940, 77 y.o.   MRN: [id number]  Briana Ochoa is a pleasant 77 y.o. year old female who presents to clinic today with Follow-up (pt here for f/u , pt c/o dark/bloody urine x1 mo and bloody and dark stools for 1 mo intermittently, pt feels weak and fatigue for x1 wk. Pt has eye exam sched 10/2017 and needs a A1C )  on 09/28/2017  HPI:  Briana Ochoa made this appointment today because she has felt weak and fatigued for the couple of months but it has become acutely worse over the past week. She has several other symptoms that she has noticed.  She has noticed she has become progressively more fatigued and short of breath with small tasks.  No chest pain.  About a month ago, she felt her urine became darker and has had intermittent dysuria.  Over the weekend, she developed diarrhea that was initially non bloody but then became bloody.  She says it looked as if she was passing blood clots.  She also has noticed since those episodes that her stool has been dark. She does have a h/o hemorrhoids.  Has never had a colonoscopy. No abdominal pain but feels her abdomen is distended.  She is experiencing bloating and early satiety over the past week.  She does have a history of left breast cancer s/p lumpectomy and radiation, currently on anastrozole.  Mammogram not due until next month.  Last saw her oncologist, Dr. Lindi Adie on 02/10/17.  Note reviewed.  Upon further questioning, she is having some mid upper back pain intermittently as well.  She is a diabetic but has not been checking her blood sugars at home.  Lab Results  Component Value Date   HGBA1C 6.8 (H) 01/02/2017   She is on ASA 325 mg daily (preventative s/p diagnosis of her aneurysm and CVA in 2016).  Lab Results  Component Value Date   WBC 11.2 (H) 01/02/2017   HGB 15.3 (H) 01/02/2017   HCT 47.2 (H) 01/02/2017   MCV 93.3 01/02/2017   PLT 341.0 01/02/2017       Current Outpatient Medications on File Prior to Visit  Medication Sig Dispense Refill  . acetaminophen (TYLENOL) 325 MG tablet Take 650 mg by mouth daily as needed (pain).    [name] anastrozole (ARIMIDEX) 1 MG tablet Take 1 tablet (1 mg total) by mouth daily. 90 tablet 3  . aspirin 325 MG tablet Take 325 mg by mouth daily.    [name] glucose blood (TRUE METRIX BLOOD GLUCOSE TEST) test strip Use as instructed to test blood sugar once daily E08.638 100 each 2  . lisinopril (PRINIVIL,ZESTRIL) 20 MG tablet Take 1 tablet (20 mg total) by mouth daily. 90 tablet 3  . pravastatin (PRAVACHOL) 20 MG tablet Take 1 tablet (20 mg total) by mouth daily. 90 tablet 3   No current facility-administered medications on file prior to visit.     No Known Allergies  Past Medical History:  Diagnosis Date  . Arthritis    L knee, R shoulder  . Arthritis of knee   . Breast cancer ([place]) 2015   left breast  . Cancer ([place])    lt breast  . Chronic kidney disease    renal insuffic, Dr. Jaci Carrel- [place]  . Complication of anesthesia 1970's   difficult time waking- felt like she couldn't breathe  . GERD (gastroesophageal reflux disease)   . Heart murmur   .  Hypertension   . 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 Malignant melanoma of lower leg, right ([place]) 12/28/2015  . Personal history of radiation therapy 2015  . Pre-diabetes    diet controlled  . Radiation 10/21/13-11/11/13   Left Breast  . Stroke ([place])    mild- during an angiogram, no deficits  . Vertigo     Past Surgical History:  Procedure Laterality Date  . BREAST BIOPSY     Left  . BREAST LUMPECTOMY Left 2015  . BREAST LUMPECTOMY WITH NEEDLE LOCALIZATION Left 09/19/2013   Procedure: LEFT BREAST LUMPECTOMY WITH DOUBLE WIRE BRACKETED  NEEDLE LOCALIZATION;  Surgeon: [name], MD;  Location: [facility];  Service: General;  Laterality: Left;  . CATARACT EXTRACTION, BILATERAL Bilateral 06/2015  .  [place]  . CHOLECYSTECTOMY    . FINGER SURGERY     lt thumb  from dog bite  . MELANOMA EXCISION Right 12/28/2015   Procedure: EXCISION MELANOMA RIGHT LOWER LEG;  Surgeon: Fanny Skates, MD;  Location: [facility];  Service: General;  Laterality: Right;  . MULTIPLE TOOTH EXTRACTIONS     top palate- wears full plate  . RADIOLOGY WITH ANESTHESIA N/A 01/09/2014   Procedure: Embolization;  Surgeon: Consuella Lose, MD;  Location: [facility];  Service: Radiology;  Laterality: N/A;  . SPLENECTOMY, TOTAL    . TONSILLECTOMY      Family History  Problem Relation Age of Onset  . Cancer Mother        kidney  . Hypertension Mother   . Heart attack Paternal Grandfather   . Breast cancer Maternal Aunt 80  . Breast cancer Paternal Aunt 71    Social History   Socioeconomic History  . Marital status: Widowed    Spouse name: Not on file  . Number of children: Not on file  . Years of education: Not on file  . Highest education level: Not on file  Occupational History    Comment: Retired  Scientific laboratory technician  . Financial resource strain: Not on file  . Food insecurity:    Worry: Not on file    Inability: Not on file  . Transportation needs:    Medical: Not on file    Non-medical: Not on file  Tobacco Use  . Smoking status: Former Smoker    Packs/day: 1.50    Types: Cigarettes    Last attempt to quit: 03/06/2003    Years since quitting: 14.5  . Smokeless tobacco: Never Used  Substance and Sexual Activity  . Alcohol use: No  . Drug use: No  . Sexual activity: Never  Lifestyle  . Physical activity:    Days per week: Not on file    Minutes per session: Not on file  . Stress: Not on file  Relationships  . Social connections:    Talks on phone: Not on file    Gets together: Not on file    Attends religious service: Not on file    Active member of club or organization: Not on file    Attends meetings of clubs or organizations: Not on file    Relationship status:  Not on file  . Intimate partner violence:    Fear of current or ex partner: Not on file    Emotionally abused: Not on file    Physically abused: Not on file    Forced sexual activity: Not on file  Other Topics Concern  . Not on  file  Social History Narrative   Widowed 2013   5 children   17 grandchildren      Does not have a living will-desires CPR, does not want prolonged life support if futile.   The PMH, PSH, Social History, Family History, Medications, and allergies have been reviewed in [facility], and have been updated if relevant.  Review of Systems  Constitutional: Positive for fatigue. Negative for fever.  Respiratory: Positive for shortness of breath. Negative for apnea, cough, choking, chest tightness, wheezing and stridor.   Cardiovascular: Negative.  Negative for chest pain, palpitations and leg swelling.  Gastrointestinal: Positive for abdominal distention, anal bleeding, blood in stool and diarrhea. Negative for abdominal pain, constipation, nausea, rectal pain and vomiting.  Endocrine: Negative.   Genitourinary: Positive for dysuria, frequency, hematuria and urgency. Negative for decreased urine volume, difficulty urinating, dyspareunia, enuresis, flank pain, menstrual problem and pelvic pain.  Musculoskeletal: Positive for back pain.  Skin: Negative for rash.  Neurological: Positive for weakness. Negative for tremors, syncope, speech difficulty, light-headedness, numbness and headaches.  All other systems reviewed and are negative.      Objective:    BP 118/70 (BP Location: Right Arm, Patient Position: Sitting, Cuff Size: Normal)   Pulse 91   Temp (!) 97.4 F (36.3 C) (Oral)   Ht 5\' 4"  (1.626 m)   Wt 193 lb 6.4 oz (87.7 kg)   SpO2 97%   BMI 33.20 kg/m   Wt Readings from Last 3 Encounters:  09/28/17 193 lb 6.4 oz (87.7 kg)  02/10/17 196 lb (88.9 kg)  01/02/17 191 lb 6.4 oz (86.8 kg)     Physical Exam  Constitutional: She is oriented to person, place, and  time. Vital signs are normal. She appears well-developed. No distress.  Appears pale, ? Jaundice, appears fatigued  HENT:  Head: Normocephalic.  Eyes: No scleral icterus.  Neck: Normal range of motion.  Cardiovascular: Normal rate and regular rhythm.  Pulmonary/Chest: Effort normal and breath sounds normal. No respiratory distress.  Abdominal: Soft. She exhibits distension. She exhibits no mass. There is no tenderness. There is no rebound and no guarding. No hernia.  Musculoskeletal: Normal range of motion. She exhibits no edema.       Thoracic back: She exhibits no tenderness and no bony tenderness.       Lumbar back: She exhibits no tenderness and no bony tenderness.  Neurological: She is alert and oriented to person, place, and time. No cranial nerve deficit.  Skin: Skin is warm and dry. She is not diaphoretic.  Psychiatric: She has a normal mood and affect. Her behavior is normal. Thought content normal.  Nursing note and vitals reviewed.         Assessment & Plan:   Dysuria - Plan: POCT Urinalysis Dipstick, Urine Culture  Weakness  Hematuria, unspecified type  Bloody stool  Diabetes mellitus due to underlying condition with other oral complication, without long-term current use of insulin (HCC) No follow-ups on file.

## 2017-09-29 ENCOUNTER — Ambulatory Visit (INDEPENDENT_AMBULATORY_CARE_PROVIDER_SITE_OTHER)
Admission: RE | Admit: 2017-09-29 | Discharge: 2017-09-29 | Disposition: A | Discharge status: Home / Self Care | Payer: Medicare PPO | Source: Ambulatory Visit | Attending: Family Medicine | Admitting: Family Medicine

## 2017-09-29 ENCOUNTER — Telehealth: Payer: Self-pay

## 2017-09-29 DIAGNOSIS — R197 Diarrhea, unspecified: Secondary | ICD-10-CM

## 2017-09-29 DIAGNOSIS — N3289 Other specified disorders of bladder: Secondary | ICD-10-CM

## 2017-09-29 DIAGNOSIS — R319 Hematuria, unspecified: Secondary | ICD-10-CM

## 2017-09-29 DIAGNOSIS — K921 Melena: Secondary | ICD-10-CM

## 2017-09-29 DIAGNOSIS — R310 Gross hematuria: Secondary | ICD-10-CM

## 2017-09-29 DIAGNOSIS — R531 Weakness: Secondary | ICD-10-CM

## 2017-09-29 HISTORY — DX: Diarrhea, unspecified: R19.7

## 2017-09-29 HISTORY — DX: Gross hematuria: R31.0

## 2017-09-29 MED ORDER — IOPAMIDOL (ISOVUE-300) INJECTION 61%
75.0000 mL | Freq: Once | INTRAVENOUS | Status: AC | PRN
  Administered 2017-09-29: 75 mL via INTRAVENOUS

## 2017-09-29 NOTE — Telephone Encounter (Signed)
Left voicemail for patient asking her to return my call so I could discuss CT results directly with her (gave her my cell phone number).  I will go ahead and place urgent referral to urology for cystoscopy as well.

## 2017-09-29 NOTE — Telephone Encounter (Signed)
TA-I received a call report  CT-Possible Malignant Bladder Mass/Dr. Nyoka Cowden read the report and recommending Cystoscopy/plz advise/thx dmf

## 2017-09-30 LAB — URINE CULTURE
MICRO NUMBER:: 91003458
SPECIMEN QUALITY:: ADEQUATE

## 2017-10-06 DIAGNOSIS — R310 Gross hematuria: Secondary | ICD-10-CM | POA: Diagnosis not present

## 2017-10-16 ENCOUNTER — Other Ambulatory Visit: Payer: Self-pay | Admitting: Urology

## 2017-10-16 ENCOUNTER — Telehealth: Payer: Self-pay | Admitting: Family Medicine

## 2017-10-16 DIAGNOSIS — N3000 Acute cystitis without hematuria: Secondary | ICD-10-CM | POA: Diagnosis not present

## 2017-10-16 NOTE — Telephone Encounter (Signed)
Copied from [place] [id number]. Topic: Inquiry >> Oct 16, 2017  4:10 PM Margot Ables wrote: Reason for CRM: Pt is having surgery to remove bladder tumor with Dr. Nicolette Bang, 1 1/2 hr surgery, under general anesthesia. Appt has not been scheduled yet. Ferne Coe is faxing over surgical clearance form. Please f/u as needed.

## 2017-10-18 NOTE — Telephone Encounter (Signed)
I called [facility] and LM stating that we have not rec forms and gave 2 numbers to fax them to/thx dmf

## 2017-10-19 NOTE — Telephone Encounter (Signed)
TA-[facility] faxed a paper asking for a note be faxed to them at [phone] [facility] stating that pt is cleared for surgery/Plz advised/thx dmf

## 2017-10-19 NOTE — Telephone Encounter (Signed)
Okay to write letter stating she is medically cleared but do they need cardiac clearance?  We don't have a recent EKG.

## 2017-10-24 NOTE — Telephone Encounter (Signed)
[facility] to verify if an EKG is needed for surgical clearance, left a VM for them to return call to office.

## 2017-10-25 NOTE — Telephone Encounter (Signed)
Spoke with [facility] and they stated they do not need an EKG for surgical clearance, only if Dr Deborra Medina feels it is necessary for this pt prior to surgery.

## 2017-10-27 DIAGNOSIS — E113291 Type 2 diabetes mellitus with mild nonproliferative diabetic retinopathy without macular edema, right eye: Secondary | ICD-10-CM | POA: Diagnosis not present

## 2017-10-27 DIAGNOSIS — H5213 Myopia, bilateral: Secondary | ICD-10-CM | POA: Diagnosis not present

## 2017-10-27 DIAGNOSIS — H349 Unspecified retinal vascular occlusion: Secondary | ICD-10-CM | POA: Diagnosis not present

## 2017-10-27 DIAGNOSIS — H35371 Puckering of macula, right eye: Secondary | ICD-10-CM | POA: Diagnosis not present

## 2017-10-27 LAB — HM DIABETES EYE EXAM

## 2017-10-27 NOTE — Telephone Encounter (Signed)
Faxed letter to [facility] at [facility] @ [phone] dmf

## 2017-10-30 DIAGNOSIS — N3000 Acute cystitis without hematuria: Secondary | ICD-10-CM | POA: Diagnosis not present

## 2017-11-02 ENCOUNTER — Encounter (HOSPITAL_BASED_OUTPATIENT_CLINIC_OR_DEPARTMENT_OTHER): Payer: Self-pay | Admitting: *Deleted

## 2017-11-02 ENCOUNTER — Other Ambulatory Visit: Payer: Self-pay

## 2017-11-02 NOTE — Progress Notes (Signed)
Spoke w/ pt via phone for pre-op interview.  Npo after mn.  Arrive at 0530.  Needs istat and ekg.

## 2017-11-07 ENCOUNTER — Encounter (HOSPITAL_BASED_OUTPATIENT_CLINIC_OR_DEPARTMENT_OTHER): Payer: Self-pay

## 2017-11-07 ENCOUNTER — Ambulatory Visit (HOSPITAL_BASED_OUTPATIENT_CLINIC_OR_DEPARTMENT_OTHER)
Admission: RE | Admit: 2017-11-07 | Discharge: 2017-11-07 | Disposition: A | Discharge status: Home / Self Care | Payer: Medicare PPO | Source: Ambulatory Visit | Attending: Urology | Admitting: Urology

## 2017-11-07 ENCOUNTER — Ambulatory Visit (HOSPITAL_BASED_OUTPATIENT_CLINIC_OR_DEPARTMENT_OTHER): Payer: Medicare PPO | Admitting: Anesthesiology

## 2017-11-07 ENCOUNTER — Encounter (HOSPITAL_BASED_OUTPATIENT_CLINIC_OR_DEPARTMENT_OTHER)
Admission: RE | Disposition: A | Discharge status: Home / Self Care | Payer: Self-pay | Source: Ambulatory Visit | Attending: Urology

## 2017-11-07 DIAGNOSIS — N183 Chronic kidney disease, stage 3 (moderate): Secondary | ICD-10-CM | POA: Diagnosis not present

## 2017-11-07 DIAGNOSIS — N1330 Unspecified hydronephrosis: Secondary | ICD-10-CM | POA: Diagnosis not present

## 2017-11-07 DIAGNOSIS — C679 Malignant neoplasm of bladder, unspecified: Secondary | ICD-10-CM | POA: Diagnosis not present

## 2017-11-07 DIAGNOSIS — Z85820 Personal history of malignant melanoma of skin: Secondary | ICD-10-CM | POA: Insufficient documentation

## 2017-11-07 DIAGNOSIS — C672 Malignant neoplasm of lateral wall of bladder: Secondary | ICD-10-CM | POA: Insufficient documentation

## 2017-11-07 DIAGNOSIS — E1122 Type 2 diabetes mellitus with diabetic chronic kidney disease: Secondary | ICD-10-CM | POA: Insufficient documentation

## 2017-11-07 DIAGNOSIS — Z8673 Personal history of transient ischemic attack (TIA), and cerebral infarction without residual deficits: Secondary | ICD-10-CM | POA: Insufficient documentation

## 2017-11-07 DIAGNOSIS — Z87891 Personal history of nicotine dependence: Secondary | ICD-10-CM | POA: Diagnosis not present

## 2017-11-07 DIAGNOSIS — Z79899 Other long term (current) drug therapy: Secondary | ICD-10-CM | POA: Insufficient documentation

## 2017-11-07 DIAGNOSIS — D494 Neoplasm of unspecified behavior of bladder: Secondary | ICD-10-CM | POA: Diagnosis not present

## 2017-11-07 DIAGNOSIS — I129 Hypertensive chronic kidney disease with stage 1 through stage 4 chronic kidney disease, or unspecified chronic kidney disease: Secondary | ICD-10-CM | POA: Diagnosis not present

## 2017-11-07 HISTORY — DX: Malignant neoplasm of central portion of left female breast: Z17.0

## 2017-11-07 HISTORY — DX: Presence of external hearing-aid: Z97.4

## 2017-11-07 HISTORY — DX: Acquired absence of spleen: Z90.81

## 2017-11-07 HISTORY — DX: Chronic kidney disease, stage 3 (moderate): N18.3

## 2017-11-07 HISTORY — DX: Dysuria: R30.0

## 2017-11-07 HISTORY — PX: TRANSURETHRAL RESECTION OF BLADDER TUMOR: SHX2575

## 2017-11-07 HISTORY — DX: Chronic kidney disease, stage 3 unspecified: N18.30

## 2017-11-07 HISTORY — DX: Type 2 diabetes mellitus without complications: E11.9

## 2017-11-07 HISTORY — DX: Frequency of micturition: R35.0

## 2017-11-07 HISTORY — DX: Unspecified osteoarthritis, unspecified site: M19.90

## 2017-11-07 HISTORY — DX: Hematuria, unspecified: R31.9

## 2017-11-07 HISTORY — DX: Presence of dental prosthetic device (complete) (partial): Z97.2

## 2017-11-07 HISTORY — DX: Personal history of transient ischemic attack (TIA), and cerebral infarction without residual deficits: Z86.73

## 2017-11-07 HISTORY — DX: Personal history of malignant melanoma of skin: Z85.820

## 2017-11-07 HISTORY — PX: CYSTOSCOPY W/ URETERAL STENT PLACEMENT: SHX1429

## 2017-11-07 HISTORY — DX: Personal history of other diseases of the circulatory system: Z86.79

## 2017-11-07 HISTORY — DX: Malignant neoplasm of central portion of left female breast: C50.112

## 2017-11-07 HISTORY — DX: Presence of spectacles and contact lenses: Z97.3

## 2017-11-07 LAB — GLUCOSE, CAPILLARY: Glucose-Capillary: 130 mg/dL — ABNORMAL HIGH (ref 70–99)

## 2017-11-07 LAB — POCT I-STAT 4, (NA,K, GLUC, HGB,HCT)
Glucose, Bld: 152 mg/dL — ABNORMAL HIGH (ref 70–99)
HCT: 36 % (ref 36.0–46.0)
Hemoglobin: 12.2 g/dL (ref 12.0–15.0)
Potassium: 4.1 mmol/L (ref 3.5–5.1)
Sodium: 141 mmol/L (ref 135–145)

## 2017-11-07 SURGERY — TURBT (TRANSURETHRAL RESECTION OF BLADDER TUMOR)
Anesthesia: General | Site: Ureter

## 2017-11-07 MED ORDER — WHITE PETROLATUM EX OINT
TOPICAL_OINTMENT | CUTANEOUS | Status: AC
  Filled 2017-11-07: qty 5

## 2017-11-07 MED ORDER — CEFAZOLIN SODIUM-DEXTROSE 2-4 GM/100ML-% IV SOLN
2.0000 g | INTRAVENOUS | Status: AC
  Administered 2017-11-07: 2 g via INTRAVENOUS
  Filled 2017-11-07: qty 100

## 2017-11-07 MED ORDER — PROPOFOL 10 MG/ML IV BOLUS
INTRAVENOUS | Status: DC | PRN
  Administered 2017-11-07: 100 mg via INTRAVENOUS

## 2017-11-07 MED ORDER — ONDANSETRON HCL 4 MG/2ML IJ SOLN
INTRAMUSCULAR | Status: AC
  Filled 2017-11-07: qty 2

## 2017-11-07 MED ORDER — SODIUM CHLORIDE 0.9 % IR SOLN
Status: DC | PRN
  Administered 2017-11-07 (×2): 3000 mL via INTRAVESICAL

## 2017-11-07 MED ORDER — FENTANYL CITRATE (PF) 100 MCG/2ML IJ SOLN
INTRAMUSCULAR | Status: DC | PRN
  Administered 2017-11-07: 25 ug via INTRAVENOUS
  Administered 2017-11-07: 75 ug via INTRAVENOUS

## 2017-11-07 MED ORDER — ACETAMINOPHEN 500 MG PO TABS
1000.0000 mg | ORAL_TABLET | Freq: Once | ORAL | Status: AC
  Administered 2017-11-07: 1000 mg via ORAL
  Filled 2017-11-07: qty 2

## 2017-11-07 MED ORDER — ROCURONIUM BROMIDE 10 MG/ML (PF) SYRINGE
PREFILLED_SYRINGE | INTRAVENOUS | Status: AC
  Filled 2017-11-07: qty 10

## 2017-11-07 MED ORDER — ROCURONIUM BROMIDE 100 MG/10ML IV SOLN
INTRAVENOUS | Status: DC | PRN
  Administered 2017-11-07: 40 mg via INTRAVENOUS

## 2017-11-07 MED ORDER — ONDANSETRON HCL 4 MG/2ML IJ SOLN
INTRAMUSCULAR | Status: DC | PRN
  Administered 2017-11-07: 4 mg via INTRAVENOUS

## 2017-11-07 MED ORDER — CEFAZOLIN SODIUM-DEXTROSE 2-4 GM/100ML-% IV SOLN
INTRAVENOUS | Status: AC
  Filled 2017-11-07: qty 100

## 2017-11-07 MED ORDER — SUGAMMADEX SODIUM 200 MG/2ML IV SOLN
INTRAVENOUS | Status: DC | PRN
  Administered 2017-11-07: 175 mg via INTRAVENOUS

## 2017-11-07 MED ORDER — HYDROCODONE-ACETAMINOPHEN 5-325 MG PO TABS: 1.0000 | ORAL_TABLET | ORAL | 0 refills | Status: DC | PRN

## 2017-11-07 MED ORDER — PHENYLEPHRINE 40 MCG/ML (10ML) SYRINGE FOR IV PUSH (FOR BLOOD PRESSURE SUPPORT)
PREFILLED_SYRINGE | INTRAVENOUS | Status: AC
  Filled 2017-11-07: qty 10

## 2017-11-07 MED ORDER — PROPOFOL 10 MG/ML IV BOLUS
INTRAVENOUS | Status: AC
  Filled 2017-11-07: qty 20

## 2017-11-07 MED ORDER — ACETAMINOPHEN 500 MG PO TABS
ORAL_TABLET | ORAL | Status: AC
  Filled 2017-11-07: qty 2

## 2017-11-07 MED ORDER — LIDOCAINE 2% (20 MG/ML) 5 ML SYRINGE
INTRAMUSCULAR | Status: DC | PRN
  Administered 2017-11-07: 80 mg via INTRAVENOUS

## 2017-11-07 MED ORDER — FENTANYL CITRATE (PF) 100 MCG/2ML IJ SOLN
25.0000 ug | INTRAMUSCULAR | Status: DC | PRN
  Filled 2017-11-07: qty 1

## 2017-11-07 MED ORDER — LIDOCAINE 2% (20 MG/ML) 5 ML SYRINGE
INTRAMUSCULAR | Status: AC
  Filled 2017-11-07: qty 5

## 2017-11-07 MED ORDER — SODIUM CHLORIDE 0.9 % IV SOLN
INTRAVENOUS | Status: DC
  Administered 2017-11-07: 06:00:00 via INTRAVENOUS
  Filled 2017-11-07: qty 1000

## 2017-11-07 MED ORDER — SUGAMMADEX SODIUM 200 MG/2ML IV SOLN
INTRAVENOUS | Status: AC
  Filled 2017-11-07: qty 2

## 2017-11-07 MED ORDER — FENTANYL CITRATE (PF) 100 MCG/2ML IJ SOLN
INTRAMUSCULAR | Status: AC
  Filled 2017-11-07: qty 2

## 2017-11-07 MED ORDER — ONDANSETRON HCL 4 MG/2ML IJ SOLN
4.0000 mg | Freq: Once | INTRAMUSCULAR | Status: DC | PRN
  Filled 2017-11-07: qty 2

## 2017-11-07 MED ORDER — PHENYLEPHRINE 40 MCG/ML (10ML) SYRINGE FOR IV PUSH (FOR BLOOD PRESSURE SUPPORT)
PREFILLED_SYRINGE | INTRAVENOUS | Status: DC | PRN
  Administered 2017-11-07 (×3): 80 ug via INTRAVENOUS

## 2017-11-07 SURGICAL SUPPLY — 32 items
BAG DRAIN URO-CYSTO SKYTR STRL (DRAIN) ×6 IMPLANT
BAG DRN ANRFLXCHMBR STRAP LEK (BAG)
BAG DRN UROCATH (DRAIN) ×2
BAG URINE DRAINAGE (UROLOGICAL SUPPLIES) ×2 IMPLANT
BAG URINE LEG 19OZ MD ST LTX (BAG) IMPLANT
CATH FOLEY 3WAY 20FR (CATHETERS) ×2 IMPLANT
CATH FOLEY 3WAY 30CC 22F (CATHETERS) ×4 IMPLANT
CATH INTERMIT  6FR 70CM (CATHETERS) ×2 IMPLANT
CLOTH BEACON ORANGE TIMEOUT ST (SAFETY) ×4 IMPLANT
ELECT LOOP 22F BIPOLAR SML (ELECTROSURGICAL) ×4
ELECT REM PT RETURN 9FT ADLT (ELECTROSURGICAL)
ELECTRODE LOOP 22F BIPOLAR SML (ELECTROSURGICAL) IMPLANT
ELECTRODE REM PT RTRN 9FT ADLT (ELECTROSURGICAL) ×2 IMPLANT
EVACUATOR MICROVAS BLADDER (UROLOGICAL SUPPLIES) IMPLANT
GLOVE BIO SURGEON STRL SZ8 (GLOVE) ×4 IMPLANT
GOWN STRL REUS W/TWL XL LVL3 (GOWN DISPOSABLE) ×4 IMPLANT
GUIDEWIRE ANG ZIPWIRE 038X150 (WIRE) ×4 IMPLANT
GUIDEWIRE STR DUAL SENSOR (WIRE) ×2 IMPLANT
INFUSOR MANOMETER BAG 3000ML (MISCELLANEOUS) ×2 IMPLANT
IV NS IRRIG 3000ML ARTHROMATIC (IV SOLUTION) ×8 IMPLANT
KIT TURNOVER CYSTO (KITS) ×4 IMPLANT
MANIFOLD NEPTUNE II (INSTRUMENTS) ×2 IMPLANT
NS IRRIG 500ML POUR BTL (IV SOLUTION) ×4 IMPLANT
PACK CYSTO (CUSTOM PROCEDURE TRAY) ×4 IMPLANT
PLUG CATH AND CAP STER (CATHETERS) ×2 IMPLANT
STENT URET 6FRX24 CONTOUR (STENTS) ×2 IMPLANT
SYR 10ML LL (SYRINGE) ×4 IMPLANT
SYR 30ML LL (SYRINGE) ×4 IMPLANT
SYRINGE IRR TOOMEY STRL 70CC (SYRINGE) IMPLANT
TUBE CONNECTING 12'X1/4 (SUCTIONS) ×1
TUBE CONNECTING 12X1/4 (SUCTIONS) ×1 IMPLANT
TUBING UROLOGY SET (TUBING) ×2 IMPLANT

## 2017-11-07 NOTE — Transfer of Care (Signed)
Immediate Anesthesia Transfer of Care Note  Patient: Briana Ochoa  Procedure(s) Performed: Procedure(s) (LRB): TRANSURETHRAL RESECTION OF BLADDER TUMOR (TURBT), POSSIBLE STENT PLACEMENT (N/A) CYSTOSCOPY WITH RETROGRADE PYELOGRAM/URETERAL STENT PLACEMENT (Left)  Patient Location: PACU  Anesthesia Type: General  Level of Consciousness: awake, oriented, sedated and patient cooperative  Airway & Oxygen Therapy: Patient Spontanous Breathing and Patient connected to face mask oxygen  Post-op Assessment: Report given to PACU RN and Post -op Vital signs reviewed and stable  Post vital signs: Reviewed and stable  Complications: No apparent anesthesia complications Last Vitals:  Vitals Value Taken Time  BP 145/73 11/07/2017  8:50 AM  Temp    Pulse 76 11/07/2017  8:52 AM  Resp 14 11/07/2017  8:52 AM  SpO2 93 % 11/07/2017  8:52 AM  Vitals shown include unvalidated device data.  Last Pain:  Vitals:   11/07/17 0600  TempSrc:   PainSc: 0-No pain      Patients Stated Pain Goal: 8 (11/07/17 0600)

## 2017-11-07 NOTE — Anesthesia Procedure Notes (Signed)
Procedure Name: Intubation Date/Time: 11/07/2017 7:45 AM Performed by: Suan Halter, CRNA Pre-anesthesia Checklist: Patient identified, Emergency Drugs available, Suction available and Patient being monitored Patient Re-evaluated:Patient Re-evaluated prior to induction Oxygen Delivery Method: Circle system utilized Preoxygenation: Pre-oxygenation with 100% oxygen Induction Type: IV induction Ventilation: Mask ventilation without difficulty Laryngoscope Size: Mac and 3 Grade View: Grade I Tube type: Oral Tube size: 7.0 mm Number of attempts: 1 Airway Equipment and Method: Stylet and Oral airway Placement Confirmation: ETT inserted through vocal cords under direct vision,  positive ETCO2 and breath sounds checked- equal and bilateral Secured at: 22 cm Tube secured with: Tape Dental Injury: Teeth and Oropharynx as per pre-operative assessment

## 2017-11-07 NOTE — Discharge Instructions (Signed)
Indwelling Urinary Catheter Care, Adult °Take good care of your catheter to keep it working and to prevent problems. °How to wear your catheter °Attach your catheter to your leg with tape (adhesive tape) or a leg strap. Make sure it is not too tight. If you use tape, remove any bits of tape that are already on the catheter. °How to wear a drainage bag °You should have: °· A large overnight bag. °· A small leg bag. ° °Overnight Bag °You may wear the overnight bag at any time. Always keep the bag below the level of your bladder but off the floor. When you sleep, put a clean plastic bag in a wastebasket. Then hang the bag inside the wastebasket. °Leg Bag °Never wear the leg bag at night. Always wear the leg bag below your knee. Keep the leg bag secure with a leg strap or tape. °How to care for your skin °· Clean the skin around the catheter at least once every day. °· Shower every day. Do not take baths. °· Put creams, lotions, or ointments on your genital area only as told by your doctor. °· Do not use powders, sprays, or lotions on your genital area. °How to clean your catheter and your skin °1. Wash your hands with soap and water. °2. Wet a washcloth in warm water and gentle (mild) soap. °3. Use the washcloth to clean the skin where the catheter enters your body. Clean downward and wipe away from the catheter in small circles. Do not wipe toward the catheter. °4. Pat the area dry with a clean towel. Make sure to clean off all soap. °How to care for your drainage bags °Empty your drainage bag when it is ?-½ full or at least 2-3 times a day. Replace your drainage bag once a month or sooner if it starts to smell bad or look dirty. Do not clean your drainage bag unless told by your doctor. °Emptying a drainage bag ° °Supplies Needed °· Rubbing alcohol. °· Gauze pad or cotton ball. °· Tape or a leg strap. ° °Steps °1. Wash your hands with soap and water. °2. Separate (detach) the bag from your leg. °3. Hold the bag over  the toilet or a clean container. Keep the bag below your hips and bladder. This stops pee (urine) from going back into the tube. °4. Open the pour spout at the bottom of the bag. °5. Empty the pee into the toilet or container. Do not let the pour spout touch any surface. °6. Put rubbing alcohol on a gauze pad or cotton ball. °7. Use the gauze pad or cotton ball to clean the pour spout. °8. Close the pour spout. °9. Attach the bag to your leg with tape or a leg strap. °10. Wash your hands. ° °Changing a drainage bag °Supplies Needed °· Alcohol wipes. °· A clean drainage bag. °· Adhesive tape or a leg strap. ° °Steps °1. Wash your hands with soap and water. °2. Separate the dirty bag from your leg. °3. Pinch the rubber catheter with your fingers so that pee does not spill out. °4. Separate the catheter tube from the drainage tube where these tubes connect (at the connection valve). Do not let the tubes touch any surface. °5. Clean the end of the catheter tube with an alcohol wipe. Use a different alcohol wipe to clean the end of the drainage tube. °6. Connect the catheter tube to the drainage tube of the clean bag. °7. Attach the new bag to   the leg with adhesive tape or a leg strap. °8. Wash your hands. ° °How to prevent infection and other problems °· Never pull on your catheter or try to remove it. Pulling can damage tissue in your body. °· Always wash your hands before and after touching your catheter. °· If a leg strap gets wet, replace it with a dry one. °· Drink enough fluids to keep your pee clear or pale yellow, or as told by your doctor. °· Do not let the drainage bag or tubing touch the floor. °· Wear cotton underwear. °· If you are female, wipe from front to back after you poop (have a bowel movement). °· Check on the catheter often to make sure it works and the tubing is not twisted. °Get help if: °· Your pee is cloudy. °· Your pee smells unusually bad. °· Your pee is not draining into the bag. °· Your  tube gets clogged. °· Your catheter starts to leak. °· Your bladder feels full. °Get help right away if: °· You have redness, swelling, or pain where the catheter enters your body. °· You have fluid, pus, or a bad smell coming from the area where the catheter enters your body. °· The area where the catheter enters your body feels warm. °· You have a fever. °· You have pain in your: °? Stomach (abdomen). °? Legs. °? Lower back. °? Bladder. °· You see blood fill the catheter. °· Your pee is pink or red. °· You feel sick to your stomach (nauseous). °· You throw up (vomit). °· You have chills. °· Your catheter gets pulled out. °This information is not intended to replace advice given to you by your health care provider. Make sure you discuss any questions you have with your health care provider. °Document Released: 05/21/2012 Document Revised: 12/23/2015 Document Reviewed: 07/09/2013 °Elsevier Interactive Patient Education © 2018 [employer] ° ° ° ° °Post Anesthesia Home Care Instructions ° °Activity: °Get plenty of rest for the remainder of the day. A responsible individual must stay with you for 24 hours following the procedure.  °For the next 24 hours, DO NOT: °-Drive a car °-Operate machinery °-Drink alcoholic beverages °-Take any medication unless instructed by your physician °-Make any legal decisions or sign important papers. ° °Meals: °Start with liquid foods such as gelatin or soup. Progress to regular foods as tolerated. Avoid greasy, spicy, heavy foods. If nausea and/or vomiting occur, drink only clear liquids until the nausea and/or vomiting subsides. Call your physician if vomiting continues. ° °Special Instructions/Symptoms: °Your throat may feel dry or sore from the anesthesia or the breathing tube placed in your throat during surgery. If this causes discomfort, gargle with warm salt water. The discomfort should disappear within 24 hours. ° °If you had a scopolamine patch placed behind your ear for the  management of post- operative nausea and/or vomiting: ° °1. The medication in the patch is effective for 72 hours, after which it should be removed.  Wrap patch in a tissue and discard in the trash. Wash hands thoroughly with soap and water. °2. You may remove the patch earlier than 72 hours if you experience unpleasant side effects which may include dry mouth, dizziness or visual disturbances. °3. Avoid touching the patch. Wash your hands with soap and water after contact with the patch. °  ° °

## 2017-11-07 NOTE — Op Note (Signed)
Preoperative diagnosis: Bladder Tumor  Postop diagnosis: Same  Procedure: 1.  Cystoscopy 2. Bilateral retrograde pyelography 3. Intra-operative fluoroscopy, under 1 hour, with interpretation 4.Transurethral resection of bladder tumor, large 5. Left 6x24 JJ ureteral Stent Placement     Attending: Nicolette Bang  Anesthesia: General  Estimated blood loss: 5 cc  Drains: 1. 18 French Foley catheter 2. Left 6x24 JJ ureteral Stent  Specimens: Bladder tumor  Antibiotics: Ancef  Findings: 6cm left lateral wall papillary tumor involving left ureter. No hydronephrosis in either collecting system  Indications: Patient is a 77 year old with a history of  bladder tumor found on office cystoscopy.   After discussing treatment options patient decided to proceed with transurethral resection of bladder tumor  Procedure in detail: Prior to procedure consetn was obtained. Patient was brought to the operating room and briefing was done sure correct patient, correct procedure, correct site.  General anesthesia was in administered patient was placed in the dorsal lithotomy position.  The rigid 65 French cystoscope was passed urethra and bladder.  Bladder was inspected masses or lesions and we noted a 6 cm lesion on the left lateral wall of the bladder.  We then cannulated the right ureteral orifice with a 6 French ureteral catheter.  A gentle retrograde was obtained in findings noted above. We were unable to identify the left ureteral orifice.  We then turned our attention to the left ureteral orifice.     We removed the cystoscope and placed a 27 French resectoscope in the bladder.  Using bipolar electrocautery were then removed the bladder tumor.  We removed the bladder tumors down to the base exposing muscle.  We then removed the pieces and sent them for pathology.  To obtain hemostasis we then cauterized the bed of the tumors.  Once good hemostasis was noted we then identified the left ureteral orifice.  The left ureteral orifice was cannulated with a 6 French ureteral catheter.  A gentle retrograde was obtained in findings noted above.  We then placed a zip wire through the ureteral catheter and advanced up to the renal pelvis.  We then placed a 6 x 24 double-J ureteral stent over the wire.  We then removed the wire and good coil was noted in the pelvis under fluoroscopy in the bladder under direct vision. the bladder was then drained and a 18 French Foley catheter was placed. This concluded the procedure which resulted by the patient.  Complications: None  Condition: Stable,  extubated, transferred to PACU.  Plan: The patient is to be discharged home and followup in 7 days for a voiding trial. Her stent is to remain in place for 4 weeks

## 2017-11-07 NOTE — H&P (Signed)
Urology Admission H&P  Chief Complaint: bladder tumor  History of Present Illness: Briana Ochoa is a 77yo with a hx of gross heamturia and a bladder tumor found on office cystoscopy. No new LUTS. No current hematuria  Past Medical History:  Diagnosis Date  . CKD (chronic kidney disease), stage III ([place])   . Diabetes mellitus type 2, diet-controlled ([place])   . Dysuria   . Frequency of urination   . Heart murmur    since rheumatic fever as child  . Hematuria   . History of CVA (cerebrovascular accident) 08/05/2014   post op cerebral angiogram, cerebral thrombosis w/ cerebral infarction;  11-02-2017  per pt no residuals  . History of malignant melanoma of skin 12/2015   s/p wide local excision right lower leg (per pt localized)  . History of rheumatic fever as a child   . Hypertension   . Intracranial aneurysm dx 07/ 2015   paraophthalmic RICA aneurysm /   s/p  Pipeline embolization right ICA 01-09-2014  . Malignant neoplasm of central portion of left breast in female, estrogen receptor positive [facility]) oncologist-  dr Lindi Adie   dx 07/ 2015--- DCIS, ER/PR positive-- s/p  left breast lumpectomy 09-19-2013,  completed radiation 11-12-2013,  started arimidex 11-12-2013  . OA (osteoarthritis)    knees, hands,  shoulder  . Personal history of radiation therapy   . 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 Wears dentures    upper  . Wears glasses   . Wears hearing aid in both ears    Past Surgical History:  Procedure Laterality Date  . BREAST LUMPECTOMY WITH NEEDLE LOCALIZATION Left 09/19/2013   Procedure: LEFT BREAST LUMPECTOMY WITH DOUBLE WIRE BRACKETED  NEEDLE LOCALIZATION;  Surgeon: [name], MD;  Location: [facility];  Service: General;  Laterality: Left;  . CATARACT EXTRACTION W/ INTRAOCULAR LENS  IMPLANT, BILATERAL  06/2015  . [place]  . CHOLECYSTECTOMY OPEN  1970s  . FINGER SURGERY     lt thumb  from dog bite  . MELANOMA EXCISION Right 12/28/2015   Procedure: EXCISION MELANOMA RIGHT LOWER LEG;  Surgeon: Fanny Skates, MD;  Location: [facility];  Service: General;  Laterality: Right;  . RADIOLOGY WITH ANESTHESIA N/A 01/09/2014   Procedure: Embolization;  Surgeon: Consuella Lose, MD;  Location: [place];  Service: Radiology;  Laterality: N/A;  . SPLENECTOMY, TOTAL  2009 approx.   splen injury due to fall off horse  . TONSILLECTOMY  child    Home Medications:  Current Facility-Administered Medications  Medication Dose Route Frequency Provider Last Rate Last Dose  . 0.9 %  sodium chloride infusion   Intravenous Continuous Catalina Gravel, MD 50 mL/hr at 11/07/17 0617    . ceFAZolin (ANCEF) IVPB 2g/100 mL premix  2 g Intravenous 30 min Pre-Op Vernida Mcnicholas, Candee Furbish, MD       Allergies: No Known Allergies  Family History  Problem Relation Age of Onset  . Cancer Mother        kidney  . Hypertension Mother   . Heart attack Paternal Grandfather   . Breast cancer Maternal Aunt 80  . Breast cancer Paternal Aunt 61   Social History:  reports that she quit smoking about 10 years ago. Her smoking use included cigarettes. She has a 60.00 pack-year smoking history. She has never used smokeless tobacco. She reports that she does not drink alcohol or use drugs.  Review of Systems  Genitourinary: Positive for  hematuria.  All other systems reviewed and are negative.   Physical Exam:  Vital signs in last 24 hours: Temp:  [98.3 F (36.8 C)] 98.3 F (36.8 C) (10/01 0542) Pulse Rate:  [87] 87 (10/01 0542) Resp:  [18] 18 (10/01 0542) BP: (152)/(80) 152/80 (10/01 0542) SpO2:  [100 %] 100 % (10/01 0542) Weight:  [87.4 kg] 87.4 kg (10/01 0542) Physical Exam  Constitutional: She is oriented to person, place, and time. She appears well-developed and well-nourished.  HENT:  Head: Normocephalic and atraumatic.  Eyes: Pupils are equal, round, and reactive to light. EOM are normal.  Neck: Normal range of motion. No thyromegaly  present.  Cardiovascular: Normal rate and regular rhythm.  Respiratory: Effort normal. No respiratory distress.  GI: Soft. She exhibits no distension.  Musculoskeletal: Normal range of motion. She exhibits no edema.  Neurological: She is alert and oriented to person, place, and time.  Skin: Skin is warm and dry.  Psychiatric: She has a normal mood and affect. Her behavior is normal. Judgment and thought content normal.    Laboratory Data:  Results for orders placed or performed during the hospital encounter of 11/07/17 (from the past 24 hour(s))  I-STAT 4, (NA,K, GLUC, HGB,HCT)     Status: Abnormal   Collection Time: 11/07/17  6:13 AM  Result Value Ref Range   Sodium 141 135 - 145 mmol/L   Potassium 4.1 3.5 - 5.1 mmol/L   Glucose, Bld 152 (H) 70 - 99 mg/dL   HCT 36.0 36.0 - 46.0 %   Hemoglobin 12.2 12.0 - 15.0 g/dL   No results found for this or any previous visit (from the past 240 hour(s)). Creatinine: No results for input(s): CREATININE in the last 168 hours. Baseline Creatinine: unknown  Impression/Assessment:  77yo with a bladder tumor  Plan:  The risks/benefits/alterantives to TURBT was explained to the patient and she understands and wishes to proceed with surgery  Nicolette Bang 11/07/2017, 7:32 AM

## 2017-11-07 NOTE — Anesthesia Preprocedure Evaluation (Addendum)
Anesthesia Evaluation  Patient identified by MRN, date of birth, ID band Patient awake    Reviewed: Allergy & Precautions, NPO status , Patient's Chart, lab work & pertinent test results  Airway Mallampati: I  TM Distance: <3 FB Neck ROM: Full    Dental  (+) Dental Advisory Given, Upper Dentures   Pulmonary former smoker,    Pulmonary exam normal breath sounds clear to auscultation       Cardiovascular hypertension, Pt. on medications Normal cardiovascular exam Rhythm:Regular Rate:Normal  Echo 08/06/14: Study Conclusions  - Left ventricle: The cavity size was normal. There was mild focal basal hypertrophy of the septum. Systolic function was normal. Wall motion was normal; there were no regional wall motion abnormalities. Doppler parameters are consistent with abnormal left ventricular relaxation (grade 1 diastolic dysfunction). There was no evidence of elevated ventricular filling pressure by   Doppler parameters. - Aortic valve: There was no regurgitation. - Aortic root: The aortic root was normal in size. - Ascending aorta: The ascending aorta was normal in size. - Mitral valve: Calcified annulus. Mildly thickened leaflets . - Left atrium: The atrium was normal in size. - Tricuspid valve: There was no regurgitation. - Pulmonic valve: There was mild regurgitation. - Pulmonary arteries: Systolic pressure was within the normal range. - Inferior vena cava: The vessel was normal in size. - Pericardium, extracardiac: A trivial pericardial effusion was identified.   Neuro/Psych Aneurysm s/p ebolization  CVA, No Residual Symptoms    GI/Hepatic negative GI ROS, Neg liver ROS,   Endo/Other  diabetes, Well Controlled, Type 2Obesity   Renal/GU Renal InsufficiencyRenal disease   Bladder tumor    Musculoskeletal  (+) Arthritis ,   Abdominal   Peds  Hematology negative hematology ROS (+)   Anesthesia Other Findings Day  of surgery medications reviewed with the patient.  Left breast cancer  Reproductive/Obstetrics                           Anesthesia Physical Anesthesia Plan  ASA: III  Anesthesia Plan: General   Post-op Pain Management:    Induction: Intravenous  PONV Risk Score and Plan: 4 or greater and Dexamethasone, Ondansetron and Treatment may vary due to age or medical condition  Airway Management Planned: Oral ETT  Additional Equipment:   Intra-op Plan:   Post-operative Plan: Extubation in OR  Informed Consent: I have reviewed the patients History and Physical, chart, labs and discussed the procedure including the risks, benefits and alternatives for the proposed anesthesia with the patient or authorized representative who has indicated his/her understanding and acceptance.   Dental advisory given  Plan Discussed with: CRNA  Anesthesia Plan Comments:         Anesthesia Quick Evaluation

## 2017-11-08 ENCOUNTER — Encounter (HOSPITAL_BASED_OUTPATIENT_CLINIC_OR_DEPARTMENT_OTHER): Payer: Self-pay | Admitting: Urology

## 2017-11-08 NOTE — Anesthesia Postprocedure Evaluation (Signed)
Anesthesia Post Note  Patient: Briana Ochoa  Procedure(s) Performed: TRANSURETHRAL RESECTION OF BLADDER TUMOR (TURBT), POSSIBLE STENT PLACEMENT (N/A Bladder) CYSTOSCOPY WITH RETROGRADE PYELOGRAM/URETERAL STENT PLACEMENT (Left Ureter)     Patient location during evaluation: PACU Anesthesia Type: General Level of consciousness: awake and alert, awake and oriented Pain management: pain level controlled Vital Signs Assessment: post-procedure vital signs reviewed and stable Respiratory status: spontaneous breathing, nonlabored ventilation and respiratory function stable Cardiovascular status: blood pressure returned to baseline and stable Postop Assessment: no apparent nausea or vomiting Anesthetic complications: no    Last Vitals:  Vitals:   11/07/17 0930 11/07/17 1032  BP: (!) 145/83 (!) 142/75  Pulse: 79 79  Resp: 20 16  Temp:  36.7 C  SpO2: 98% 99%    Last Pain:  Vitals:   11/08/17 0955  TempSrc:   PainSc: 2                  Catalina Gravel

## 2017-11-14 DIAGNOSIS — N3000 Acute cystitis without hematuria: Secondary | ICD-10-CM | POA: Diagnosis not present

## 2017-11-16 ENCOUNTER — Other Ambulatory Visit: Payer: Self-pay | Admitting: Urology

## 2017-11-16 DIAGNOSIS — C672 Malignant neoplasm of lateral wall of bladder: Secondary | ICD-10-CM | POA: Diagnosis not present

## 2017-11-21 ENCOUNTER — Other Ambulatory Visit: Payer: Self-pay | Admitting: Hematology and Oncology

## 2017-11-21 ENCOUNTER — Other Ambulatory Visit: Payer: Self-pay | Admitting: Family Medicine

## 2017-11-21 DIAGNOSIS — Z853 Personal history of malignant neoplasm of breast: Secondary | ICD-10-CM

## 2017-11-27 ENCOUNTER — Ambulatory Visit
Admission: RE | Admit: 2017-11-27 | Discharge: 2017-11-27 | Disposition: A | Discharge status: Home / Self Care | Payer: Medicare PPO | Source: Ambulatory Visit | Attending: Hematology and Oncology | Admitting: Hematology and Oncology

## 2017-11-27 DIAGNOSIS — R928 Other abnormal and inconclusive findings on diagnostic imaging of breast: Secondary | ICD-10-CM | POA: Diagnosis not present

## 2017-11-27 DIAGNOSIS — Z853 Personal history of malignant neoplasm of breast: Secondary | ICD-10-CM | POA: Diagnosis not present

## 2017-12-08 ENCOUNTER — Encounter (HOSPITAL_BASED_OUTPATIENT_CLINIC_OR_DEPARTMENT_OTHER): Payer: Self-pay

## 2017-12-11 ENCOUNTER — Other Ambulatory Visit: Payer: Self-pay

## 2017-12-11 ENCOUNTER — Encounter (HOSPITAL_BASED_OUTPATIENT_CLINIC_OR_DEPARTMENT_OTHER): Payer: Self-pay | Admitting: *Deleted

## 2017-12-11 NOTE — Progress Notes (Signed)
Spoke with Briana Ochoa npo after midnight arrive 1015 am 12-14-17 [facility] meds to take sip of water: anastrozole, pravastatin, tylenol es prn ekg 11-07-17 epic/chart Has surgery orders in epic Needs I stat 4 and cbg

## 2017-12-14 ENCOUNTER — Ambulatory Visit (HOSPITAL_BASED_OUTPATIENT_CLINIC_OR_DEPARTMENT_OTHER)
Admission: RE | Admit: 2017-12-14 | Discharge: 2017-12-14 | Disposition: A | Discharge status: Home / Self Care | Payer: Medicare PPO | Source: Ambulatory Visit | Attending: Urology | Admitting: Urology

## 2017-12-14 ENCOUNTER — Encounter (HOSPITAL_BASED_OUTPATIENT_CLINIC_OR_DEPARTMENT_OTHER): Payer: Self-pay | Admitting: Anesthesiology

## 2017-12-14 ENCOUNTER — Ambulatory Visit (HOSPITAL_BASED_OUTPATIENT_CLINIC_OR_DEPARTMENT_OTHER): Payer: Medicare PPO | Admitting: Anesthesiology

## 2017-12-14 ENCOUNTER — Other Ambulatory Visit: Payer: Self-pay

## 2017-12-14 ENCOUNTER — Encounter (HOSPITAL_BASED_OUTPATIENT_CLINIC_OR_DEPARTMENT_OTHER)
Admission: RE | Disposition: A | Discharge status: Home / Self Care | Payer: Self-pay | Source: Ambulatory Visit | Attending: Urology

## 2017-12-14 DIAGNOSIS — Z8673 Personal history of transient ischemic attack (TIA), and cerebral infarction without residual deficits: Secondary | ICD-10-CM | POA: Diagnosis not present

## 2017-12-14 DIAGNOSIS — C679 Malignant neoplasm of bladder, unspecified: Secondary | ICD-10-CM | POA: Diagnosis not present

## 2017-12-14 DIAGNOSIS — E1122 Type 2 diabetes mellitus with diabetic chronic kidney disease: Secondary | ICD-10-CM | POA: Insufficient documentation

## 2017-12-14 DIAGNOSIS — Z85820 Personal history of malignant melanoma of skin: Secondary | ICD-10-CM | POA: Insufficient documentation

## 2017-12-14 DIAGNOSIS — Z87891 Personal history of nicotine dependence: Secondary | ICD-10-CM | POA: Diagnosis not present

## 2017-12-14 DIAGNOSIS — I129 Hypertensive chronic kidney disease with stage 1 through stage 4 chronic kidney disease, or unspecified chronic kidney disease: Secondary | ICD-10-CM | POA: Insufficient documentation

## 2017-12-14 DIAGNOSIS — N3090 Cystitis, unspecified without hematuria: Secondary | ICD-10-CM | POA: Diagnosis not present

## 2017-12-14 DIAGNOSIS — D494 Neoplasm of unspecified behavior of bladder: Secondary | ICD-10-CM | POA: Diagnosis not present

## 2017-12-14 DIAGNOSIS — Z853 Personal history of malignant neoplasm of breast: Secondary | ICD-10-CM | POA: Diagnosis not present

## 2017-12-14 DIAGNOSIS — N183 Chronic kidney disease, stage 3 (moderate): Secondary | ICD-10-CM | POA: Diagnosis not present

## 2017-12-14 DIAGNOSIS — N3289 Other specified disorders of bladder: Secondary | ICD-10-CM | POA: Insufficient documentation

## 2017-12-14 DIAGNOSIS — Z79899 Other long term (current) drug therapy: Secondary | ICD-10-CM | POA: Diagnosis not present

## 2017-12-14 DIAGNOSIS — Z9081 Acquired absence of spleen: Secondary | ICD-10-CM | POA: Diagnosis not present

## 2017-12-14 DIAGNOSIS — E1151 Type 2 diabetes mellitus with diabetic peripheral angiopathy without gangrene: Secondary | ICD-10-CM | POA: Diagnosis not present

## 2017-12-14 DIAGNOSIS — Z923 Personal history of irradiation: Secondary | ICD-10-CM | POA: Insufficient documentation

## 2017-12-14 HISTORY — DX: Other specified disorders of bone density and structure, unspecified site: M85.80

## 2017-12-14 HISTORY — DX: Other fatigue: R53.83

## 2017-12-14 HISTORY — DX: Heart disease, unspecified: I51.9

## 2017-12-14 HISTORY — DX: Personal history of other diseases of the circulatory system: Z86.79

## 2017-12-14 HISTORY — DX: Weakness: R53.1

## 2017-12-14 HISTORY — PX: TRANSURETHRAL RESECTION OF BLADDER TUMOR: SHX2575

## 2017-12-14 HISTORY — DX: Neoplasm of unspecified behavior of bladder: D49.4

## 2017-12-14 HISTORY — DX: Unspecified jaundice: R17

## 2017-12-14 HISTORY — DX: Gross hematuria: R31.0

## 2017-12-14 HISTORY — DX: Dyspnea, unspecified: R06.00

## 2017-12-14 HISTORY — DX: Diarrhea, unspecified: R19.7

## 2017-12-14 LAB — GLUCOSE, CAPILLARY: Glucose-Capillary: 114 mg/dL — ABNORMAL HIGH (ref 70–99)

## 2017-12-14 LAB — POCT I-STAT, CHEM 8
BUN: 26 mg/dL — ABNORMAL HIGH (ref 8–23)
Calcium, Ion: 1.26 mmol/L (ref 1.15–1.40)
Chloride: 108 mmol/L (ref 98–111)
Creatinine, Ser: 1.5 mg/dL — ABNORMAL HIGH (ref 0.44–1.00)
Glucose, Bld: 141 mg/dL — ABNORMAL HIGH (ref 70–99)
HCT: 39 % (ref 36.0–46.0)
Hemoglobin: 13.3 g/dL (ref 12.0–15.0)
Potassium: 4.6 mmol/L (ref 3.5–5.1)
Sodium: 141 mmol/L (ref 135–145)
TCO2: 23 mmol/L (ref 22–32)

## 2017-12-14 SURGERY — TURBT (TRANSURETHRAL RESECTION OF BLADDER TUMOR)
Anesthesia: General

## 2017-12-14 MED ORDER — FENTANYL CITRATE (PF) 100 MCG/2ML IJ SOLN
INTRAMUSCULAR | Status: DC | PRN
  Administered 2017-12-14: 50 ug via INTRAVENOUS

## 2017-12-14 MED ORDER — PROPOFOL 10 MG/ML IV BOLUS
INTRAVENOUS | Status: DC | PRN
  Administered 2017-12-14: 120 mg via INTRAVENOUS

## 2017-12-14 MED ORDER — HYDROCODONE-ACETAMINOPHEN 5-325 MG PO TABS: 1.0000 | ORAL_TABLET | ORAL | 0 refills | Status: DC | PRN

## 2017-12-14 MED ORDER — FENTANYL CITRATE (PF) 100 MCG/2ML IJ SOLN
INTRAMUSCULAR | Status: AC
  Filled 2017-12-14: qty 2

## 2017-12-14 MED ORDER — DEXAMETHASONE SODIUM PHOSPHATE 10 MG/ML IJ SOLN
INTRAMUSCULAR | Status: DC | PRN
  Administered 2017-12-14: 10 mg via INTRAVENOUS

## 2017-12-14 MED ORDER — OXYCODONE HCL 5 MG/5ML PO SOLN
5.0000 mg | Freq: Once | ORAL | Status: DC | PRN
  Filled 2017-12-14: qty 5

## 2017-12-14 MED ORDER — ROCURONIUM BROMIDE 10 MG/ML (PF) SYRINGE
PREFILLED_SYRINGE | INTRAVENOUS | Status: DC | PRN
  Administered 2017-12-14: 45 mg via INTRAVENOUS

## 2017-12-14 MED ORDER — PHENYLEPHRINE 40 MCG/ML (10ML) SYRINGE FOR IV PUSH (FOR BLOOD PRESSURE SUPPORT)
PREFILLED_SYRINGE | INTRAVENOUS | Status: AC
  Filled 2017-12-14: qty 10

## 2017-12-14 MED ORDER — LIDOCAINE 2% (20 MG/ML) 5 ML SYRINGE
INTRAMUSCULAR | Status: DC | PRN
  Administered 2017-12-14: 80 mg via INTRAVENOUS

## 2017-12-14 MED ORDER — CEFAZOLIN SODIUM-DEXTROSE 2-4 GM/100ML-% IV SOLN
2.0000 g | INTRAVENOUS | Status: AC
  Administered 2017-12-14: 2 g via INTRAVENOUS
  Filled 2017-12-14: qty 100

## 2017-12-14 MED ORDER — LIDOCAINE 2% (20 MG/ML) 5 ML SYRINGE
INTRAMUSCULAR | Status: AC
  Filled 2017-12-14: qty 5

## 2017-12-14 MED ORDER — DEXAMETHASONE SODIUM PHOSPHATE 10 MG/ML IJ SOLN
INTRAMUSCULAR | Status: AC
  Filled 2017-12-14: qty 1

## 2017-12-14 MED ORDER — OXYCODONE HCL 5 MG PO TABS
5.0000 mg | ORAL_TABLET | Freq: Once | ORAL | Status: DC | PRN
  Filled 2017-12-14: qty 1

## 2017-12-14 MED ORDER — PROPOFOL 10 MG/ML IV BOLUS
INTRAVENOUS | Status: AC
  Filled 2017-12-14: qty 20

## 2017-12-14 MED ORDER — ONDANSETRON HCL 4 MG/2ML IJ SOLN
INTRAMUSCULAR | Status: DC | PRN
  Administered 2017-12-14: 4 mg via INTRAVENOUS

## 2017-12-14 MED ORDER — SUGAMMADEX SODIUM 200 MG/2ML IV SOLN
INTRAVENOUS | Status: DC | PRN
  Administered 2017-12-14: 200 mg via INTRAVENOUS

## 2017-12-14 MED ORDER — HYDROMORPHONE HCL 1 MG/ML IJ SOLN
0.2500 mg | INTRAMUSCULAR | Status: DC | PRN
  Filled 2017-12-14: qty 0.5

## 2017-12-14 MED ORDER — ONDANSETRON HCL 4 MG/2ML IJ SOLN
INTRAMUSCULAR | Status: AC
  Filled 2017-12-14: qty 2

## 2017-12-14 MED ORDER — ROCURONIUM BROMIDE 10 MG/ML (PF) SYRINGE
PREFILLED_SYRINGE | INTRAVENOUS | Status: AC
  Filled 2017-12-14: qty 10

## 2017-12-14 MED ORDER — PHENYLEPHRINE 40 MCG/ML (10ML) SYRINGE FOR IV PUSH (FOR BLOOD PRESSURE SUPPORT)
PREFILLED_SYRINGE | INTRAVENOUS | Status: DC | PRN
  Administered 2017-12-14: 120 ug via INTRAVENOUS

## 2017-12-14 MED ORDER — SODIUM CHLORIDE 0.9 % IR SOLN
Status: DC | PRN
  Administered 2017-12-14: 3000 mL

## 2017-12-14 MED ORDER — PHENYLEPHRINE 40 MCG/ML (10ML) SYRINGE FOR IV PUSH (FOR BLOOD PRESSURE SUPPORT): PREFILLED_SYRINGE | INTRAVENOUS | Status: DC | PRN

## 2017-12-14 MED ORDER — PROMETHAZINE HCL 25 MG/ML IJ SOLN
6.2500 mg | INTRAMUSCULAR | Status: DC | PRN
  Filled 2017-12-14: qty 1

## 2017-12-14 MED ORDER — LACTATED RINGERS IV SOLN
INTRAVENOUS | Status: DC
  Administered 2017-12-14: 14:00:00 via INTRAVENOUS
  Filled 2017-12-14: qty 1000

## 2017-12-14 MED ORDER — SUGAMMADEX SODIUM 200 MG/2ML IV SOLN
INTRAVENOUS | Status: AC
  Filled 2017-12-14: qty 2

## 2017-12-14 MED ORDER — MEPERIDINE HCL 25 MG/ML IJ SOLN
6.2500 mg | INTRAMUSCULAR | Status: DC | PRN
  Filled 2017-12-14: qty 1

## 2017-12-14 MED ORDER — CEFAZOLIN SODIUM-DEXTROSE 2-4 GM/100ML-% IV SOLN
INTRAVENOUS | Status: AC
  Filled 2017-12-14: qty 100

## 2017-12-14 MED ORDER — SODIUM CHLORIDE 0.9 % IV SOLN
INTRAVENOUS | Status: DC
  Administered 2017-12-14: 11:00:00 via INTRAVENOUS
  Filled 2017-12-14: qty 1000

## 2017-12-14 SURGICAL SUPPLY — 26 items
BAG DRAIN URO-CYSTO SKYTR STRL (DRAIN) ×2 IMPLANT
BAG DRN ANRFLXCHMBR STRAP LEK (BAG)
BAG DRN UROCATH (DRAIN) ×1
BAG URINE DRAINAGE (UROLOGICAL SUPPLIES) ×1 IMPLANT
BAG URINE LEG 19OZ MD ST LTX (BAG) IMPLANT
CATH FOLEY 3WAY 30CC 22F (CATHETERS) ×2 IMPLANT
CLOTH BEACON ORANGE TIMEOUT ST (SAFETY) ×2 IMPLANT
ELECT REM PT RETURN 9FT ADLT (ELECTROSURGICAL) ×2
ELECTRODE REM PT RTRN 9FT ADLT (ELECTROSURGICAL) ×1 IMPLANT
GLOVE BIO SURGEON STRL SZ8 (GLOVE) ×2 IMPLANT
GLOVE BIOGEL PI IND STRL 6.5 (GLOVE) IMPLANT
GLOVE BIOGEL PI INDICATOR 6.5 (GLOVE) ×1
GLOVE SURG SS PI 6.5 STRL IVOR (GLOVE) ×1 IMPLANT
GOWN STRL REUS W/ TWL LRG LVL3 (GOWN DISPOSABLE) IMPLANT
GOWN STRL REUS W/TWL LRG LVL3 (GOWN DISPOSABLE) ×2
GOWN STRL REUS W/TWL XL LVL3 (GOWN DISPOSABLE) ×2 IMPLANT
IV NS IRRIG 3000ML ARTHROMATIC (IV SOLUTION) ×2 IMPLANT
KIT TURNOVER CYSTO (KITS) ×2 IMPLANT
MANIFOLD NEPTUNE II (INSTRUMENTS) ×1 IMPLANT
PACK CYSTO (CUSTOM PROCEDURE TRAY) ×2 IMPLANT
PLUG CATH AND CAP STER (CATHETERS) IMPLANT
STENT URET 6FRX24 CONTOUR (STENTS) ×1 IMPLANT
SYR 30ML LL (SYRINGE) ×2 IMPLANT
SYRINGE IRR TOOMEY STRL 70CC (SYRINGE) IMPLANT
TUBE CONNECTING 12X1/4 (SUCTIONS) ×1 IMPLANT
TUBING UROLOGY SET (TUBING) ×2 IMPLANT

## 2017-12-14 NOTE — Anesthesia Procedure Notes (Signed)
Procedure Name: Intubation Date/Time: 12/14/2017 12:47 PM Performed by: Bonney Aid, CRNA Pre-anesthesia Checklist: Patient identified, Emergency Drugs available, Suction available and Patient being monitored Patient Re-evaluated:Patient Re-evaluated prior to induction Oxygen Delivery Method: Circle system utilized Preoxygenation: Pre-oxygenation with 100% oxygen Induction Type: IV induction Ventilation: Mask ventilation without difficulty Laryngoscope Size: Mac and 3 Grade View: Grade I Tube type: Oral Tube size: 7.0 mm Number of attempts: 1 Airway Equipment and Method: Stylet and Oral airway Placement Confirmation: ETT inserted through vocal cords under direct vision,  positive ETCO2 and breath sounds checked- equal and bilateral Secured at: 21 cm Tube secured with: Tape Dental Injury: Teeth and Oropharynx as per pre-operative assessment

## 2017-12-14 NOTE — Anesthesia Procedure Notes (Signed)
Performed by: Tylan Briguglio T, CRNA       

## 2017-12-14 NOTE — Discharge Instructions (Signed)
Indwelling Urinary Catheter Care, Adult Take good care of your catheter to keep it working and to prevent problems. How to wear your catheter Attach your catheter to your leg with tape (adhesive tape) or a leg strap. Make sure it is not too tight. If you use tape, remove any bits of tape that are already on the catheter. How to wear a drainage bag You should have:  A large overnight bag.  A small leg bag.  Overnight Bag You may wear the overnight bag at any time. Always keep the bag below the level of your bladder but off the floor. When you sleep, put a clean plastic bag in a wastebasket. Then hang the bag inside the wastebasket. Leg Bag Never wear the leg bag at night. Always wear the leg bag below your knee. Keep the leg bag secure with a leg strap or tape. How to care for your skin  Clean the skin around the catheter at least once every day.  Shower every day. Do not take baths.  Put creams, lotions, or ointments on your genital area only as told by your doctor.  Do not use powders, sprays, or lotions on your genital area. How to clean your catheter and your skin 1. Wash your hands with soap and water. 2. Wet a washcloth in warm water and gentle (mild) soap. 3. Use the washcloth to clean the skin where the catheter enters your body. Clean downward and wipe away from the catheter in small circles. Do not wipe toward the catheter. 4. Pat the area dry with a clean towel. Make sure to clean off all soap. How to care for your drainage bags Empty your drainage bag when it is ?- full or at least 2-3 times a day. Replace your drainage bag once a month or sooner if it starts to smell bad or look dirty. Do not clean your drainage bag unless told by your doctor. Emptying a drainage bag  Supplies Needed  Rubbing alcohol.  Gauze pad or cotton ball.  Tape or a leg strap.  Steps 1. Wash your hands with soap and water. 2. Separate (detach) the bag from your leg. 3. Hold the bag over  the toilet or a clean container. Keep the bag below your hips and bladder. This stops pee (urine) from going back into the tube. 4. Open the pour spout at the bottom of the bag. 5. Empty the pee into the toilet or container. Do not let the pour spout touch any surface. 6. Put rubbing alcohol on a gauze pad or cotton ball. 7. Use the gauze pad or cotton ball to clean the pour spout. 8. Close the pour spout. 9. Attach the bag to your leg with tape or a leg strap. 10. Wash your hands.  Changing a drainage bag Supplies Needed  Alcohol wipes.  A clean drainage bag.  Adhesive tape or a leg strap.  Steps 1. Wash your hands with soap and water. 2. Separate the dirty bag from your leg. 3. Pinch the rubber catheter with your fingers so that pee does not spill out. 4. Separate the catheter tube from the drainage tube where these tubes connect (at the connection valve). Do not let the tubes touch any surface. 5. Clean the end of the catheter tube with an alcohol wipe. Use a different alcohol wipe to clean the end of the drainage tube. 6. Connect the catheter tube to the drainage tube of the clean bag. 7. Attach the new bag to  the leg with adhesive tape or a leg strap. 8. Wash your hands.  How to prevent infection and other problems  Never pull on your catheter or try to remove it. Pulling can damage tissue in your body.  Always wash your hands before and after touching your catheter.  If a leg strap gets wet, replace it with a dry one.  Drink enough fluids to keep your pee clear or pale yellow, or as told by your doctor.  Do not let the drainage bag or tubing touch the floor.  Wear cotton underwear.  If you are female, wipe from front to back after you poop (have a bowel movement).  Check on the catheter often to make sure it works and the tubing is not twisted. Get help if:  Your pee is cloudy.  Your pee smells unusually bad.  Your pee is not draining into the bag.  Your  tube gets clogged.  Your catheter starts to leak.  Your bladder feels full. Get help right away if:  You have redness, swelling, or pain where the catheter enters your body.  You have fluid, pus, or a bad smell coming from the area where the catheter enters your body.  The area where the catheter enters your body feels warm.  You have a fever.  You have pain in your: ? Stomach (abdomen). ? Legs. ? Lower back. ? Bladder.  You see blood fill the catheter.  Your pee is pink or red.  You feel sick to your stomach (nauseous).  You throw up (vomit).  You have chills.  Your catheter gets pulled out. This information is not intended to replace advice given to you by your health care provider. Make sure you discuss any questions you have with your health care provider. Document Released: 05/21/2012 Document Revised: 12/23/2015 Document Reviewed: 07/09/2013 Elsevier Interactive Patient Education  2018 [facility] [phone] Post Ureteroscopy With or Without Stent Instructions  Definitions:  Ureter: The duct that transports urine from the kidney to the bladder. Stent:   A plastic hollow tube that is placed into the ureter, from the kidney to the                 bladder to prevent the ureter from swelling shut.  GENERAL INSTRUCTIONS:  Despite the fact that no skin incisions were used, the area around the ureter and bladder is raw and irritated. The stent is a foreign body which will further irritate the bladder wall. This irritation is manifested by increased frequency of urination, both day and night, and by an increase in the urge to urinate. In some, the urge to urinate is present almost always. Sometimes the urge is strong enough that you may not be able to stop yourself from urinating. The only real cure is to remove the stent and then give time for the bladder wall to heal which can't be done until the danger of the ureter swelling shut has  passed, which varies.  You may see some blood in your urine while the stent is in place and a few days afterwards. Do not be alarmed, even if the urine was clear for a while. Get off your feet and drink lots of fluids until clearing occurs. If you start to pass clots or don't improve, call us.  DIET: You may return to your normal diet immediately. Because of the raw surface of your bladder, alcohol, spicy foods, acid type foods and drinks with caffeine may cause  irritation or frequency and should be used in moderation. To keep your urine flowing freely and to avoid constipation, drink plenty of fluids during the day ( 8-10 glasses ). Tip: Avoid cranberry juice because it is very acidic.  ACTIVITY: Your physical activity doesn't need to be restricted. However, if you are very active, you may see some blood in your urine. We suggest that you reduce your activity under these circumstances until the bleeding has stopped.  BOWELS: It is important to keep your bowels regular during the postoperative period. Straining with bowel movements can cause bleeding. A bowel movement every other day is reasonable. Use a mild laxative if needed, such as Milk of Magnesia 2-3 tablespoons, or 2 Dulcolax tablets. Call if you continue to have problems. If you have been taking narcotics for pain, before, during or after your surgery, you may be constipated. Take a laxative if necessary.   MEDICATION: You should resume your pre-surgery medications unless told not to. In addition you will often be given an antibiotic to prevent infection. These should be taken as prescribed until the bottles are finished unless you are having an unusual reaction to one of the drugs.  PROBLEMS YOU SHOULD REPORT TO [place]:  Fevers over 100.5 Fahrenheit.  Heavy bleeding, or clots ( See above notes about blood in urine ).  Inability to urinate.  Drug reactions ( hives, rash, nausea, vomiting, diarrhea ).  Severe burning or pain with  urination that is not improving.  FOLLOW-UP: You will need a follow-up appointment to monitor your progress. Call for this appointment at the number listed above. Usually the first appointment will be about three to fourteen days after your surgery.      Post Anesthesia Home Care Instructions  Activity: Get plenty of rest for the remainder of the day. A responsible individual must stay with you for 24 hours following the procedure.  For the next 24 hours, DO NOT: -Drive a car -Paediatric nurse -Drink alcoholic beverages -Take any medication unless instructed by your physician -Make any legal decisions or sign important papers.  Meals: Start with liquid foods such as gelatin or soup. Progress to regular foods as tolerated. Avoid greasy, spicy, heavy foods. If nausea and/or vomiting occur, drink only clear liquids until the nausea and/or vomiting subsides. Call your physician if vomiting continues.  Special Instructions/Symptoms: Your throat may feel dry or sore from the anesthesia or the breathing tube placed in your throat during surgery. If this causes discomfort, gargle with warm salt water. The discomfort should disappear within 24 hours.  If you had a scopolamine patch placed behind your ear for the management of post- operative nausea and/or vomiting:  1. The medication in the patch is effective for 72 hours, after which it should be removed.  Wrap patch in a tissue and discard in the trash. Wash hands thoroughly with soap and water. 2. You may remove the patch earlier than 72 hours if you experience unpleasant side effects which may include dry mouth, dizziness or visual disturbances. 3. Avoid touching the patch. Wash your hands with soap and water after contact with the patch.    Post Anesthesia Home Care Instructions  Activity: Get plenty of rest for the remainder of the day. A responsible individual must stay with you for 24 hours following the procedure.  For the next  24 hours, DO NOT: -Drive a car -Paediatric nurse -Drink alcoholic beverages -Take any medication unless instructed by your physician -Make any legal decisions or sign  important papers.  Meals: Start with liquid foods such as gelatin or soup. Progress to regular foods as tolerated. Avoid greasy, spicy, heavy foods. If nausea and/or vomiting occur, drink only clear liquids until the nausea and/or vomiting subsides. Call your physician if vomiting continues.  Special Instructions/Symptoms: Your throat may feel dry or sore from the anesthesia or the breathing tube placed in your throat during surgery. If this causes discomfort, gargle with warm salt water. The discomfort should disappear within 24 hours.

## 2017-12-14 NOTE — Brief Op Note (Signed)
12/14/2017  1:16 PM  PATIENT:  Briana Ochoa  77 y.o. female  PRE-OPERATIVE DIAGNOSIS:  BLADDER TUMOR  POST-OPERATIVE DIAGNOSIS:  BLADDER TUMOR  PROCEDURE:  Procedure(s) with comments: TRANSURETHRAL RESECTION OF BLADDER TUMOR (TURBT) (N/A) - 30 MINS  SURGEON:  Surgeon(s) and Role:    * Vikkie Goeden, Candee Furbish, MD - Primary  PHYSICIAN ASSISTANT:   ASSISTANTS: none   ANESTHESIA:   general  EBL:  10 mL   BLOOD ADMINISTERED:none  DRAINS: 3O75 JJ ureteral stent. foley catheter   LOCAL MEDICATIONS USED:  NONE  SPECIMEN:  Source of Specimen:  left lateral wall bladder tumor  DISPOSITION OF SPECIMEN:  PATHOLOGY  COUNTS:  YES  TOURNIQUET:  * No tourniquets in log *  DICTATION: .Note written in EPIC  PLAN OF CARE: Discharge to home after PACU  PATIENT DISPOSITION:  PACU - hemodynamically stable.   Delay start of Pharmacological VTE agent (>24hrs) due to surgical blood loss or risk of bleeding: not applicable

## 2017-12-14 NOTE — Anesthesia Preprocedure Evaluation (Addendum)
Anesthesia Evaluation  Patient identified by MRN, date of birth, ID band Patient awake    Reviewed: Allergy & Precautions, NPO status , Patient's Chart, lab work & pertinent test results  Airway Mallampati: II  TM Distance: >3 FB Neck ROM: Full    Dental  (+) Upper Dentures, Dental Advisory Given   Pulmonary former smoker,    breath sounds clear to auscultation       Cardiovascular hypertension, Pt. on medications + Peripheral Vascular Disease  + Valvular Problems/Murmurs  Rhythm:Regular Rate:Normal     Neuro/Psych CVA    GI/Hepatic negative GI ROS, Neg liver ROS,   Endo/Other  diabetes, Type 2  Renal/GU CRFRenal disease     Musculoskeletal   Abdominal (+) + obese,   Peds  Hematology   Anesthesia Other Findings   Reproductive/Obstetrics                            Lab Results  Component Value Date   WBC 12.1 (H) 09/28/2017   HGB 12.2 11/07/2017   HCT 36.0 11/07/2017   MCV 90.5 09/28/2017   PLT 374.0 09/28/2017   Lab Results  Component Value Date   CREATININE 1.54 (H) 09/28/2017   BUN 31 (H) 09/28/2017   NA 141 11/07/2017   K 4.1 11/07/2017   CL 105 09/28/2017   CO2 27 09/28/2017   Lab Results  Component Value Date   INR 1.01 07/22/2014   INR 0.97 12/31/2013   INR 1.03 09/19/2013   EKG: normal sinus rhythm.  Anesthesia Physical Anesthesia Plan  ASA: III  Anesthesia Plan: General   Post-op Pain Management:    Induction: Intravenous  PONV Risk Score and Plan: 3 and Ondansetron, Dexamethasone and Treatment may vary due to age or medical condition  Airway Management Planned: Oral ETT and LMA  Additional Equipment: None  Intra-op Plan:   Post-operative Plan: Extubation in OR  Informed Consent: I have reviewed the patients History and Physical, chart, labs and discussed the procedure including the risks, benefits and alternatives for the proposed anesthesia with the  patient or authorized representative who has indicated his/her understanding and acceptance.   Dental advisory given  Plan Discussed with: CRNA  Anesthesia Plan Comments:        Anesthesia Quick Evaluation

## 2017-12-14 NOTE — Transfer of Care (Signed)
Immediate Anesthesia Transfer of Care Note  Patient: Briana Ochoa  Procedure(s) Performed: TRANSURETHRAL RESECTION OF BLADDER TUMOR (TURBT) (N/A )  Patient Location: PACU  Anesthesia Type:General  Level of Consciousness: drowsy  Airway & Oxygen Therapy: Patient Spontanous Breathing and Patient connected to nasal cannula oxygen  Post-op Assessment: Report given to RN  Post vital signs: Reviewed and stable  Last Vitals: 148/78, 83, 16, 94% Vitals Value Taken Time  BP    Temp    Pulse 80 12/14/2017  1:24 PM  Resp    SpO2 94 % 12/14/2017  1:24 PM  Vitals shown include unvalidated device data.  Last Pain:  Vitals:   12/14/17 1021  TempSrc: Oral         Complications: No apparent anesthesia complications

## 2017-12-14 NOTE — Anesthesia Postprocedure Evaluation (Signed)
Anesthesia Post Note  Patient: Briana Ochoa  Procedure(s) Performed: TRANSURETHRAL RESECTION OF BLADDER TUMOR (TURBT) (N/A )     Patient location during evaluation: PACU Anesthesia Type: General Level of consciousness: sedated Pain management: pain level controlled Vital Signs Assessment: post-procedure vital signs reviewed and stable Respiratory status: spontaneous breathing and respiratory function stable Cardiovascular status: stable Postop Assessment: no apparent nausea or vomiting Anesthetic complications: no    Last Vitals:  Vitals:   12/14/17 1400 12/14/17 1415  BP: (!) 141/73 131/74  Pulse: 75 75  Resp: 10 10  Temp:    SpO2: 92% 92%    Last Pain:  Vitals:   12/14/17 1415  TempSrc:   PainSc: 0-No pain                 Miara Emminger DANIEL

## 2017-12-14 NOTE — H&P (Signed)
Urology Admission H&P  Chief Complaint: bladder cancer  History of Present Illness: Briana Ochoa is a 77yo with a hx of high grade bladder cancer here for repeat resection. No hematuria or dysuria. She has an indwelling left ureteral stent  Past Medical History:  Diagnosis Date  . Bladder tumor   . Bloody diarrhea 09/29/2017  . CKD (chronic kidney disease), stage III ([place])   . Diabetes mellitus type 2, diet-controlled ([place])   . Dyspnea 09/28/2017  . Dysuria   . Fatigue 09/28/2017  . Frequency of urination   . Grade I diastolic dysfunction 08/67/6195   Noted on ECHO  . Gross hematuria 09/29/2017  . Heart murmur    since rheumatic fever as child  . Hematuria   . History of cardiomegaly 02/12/2005   Mild, noted on CXR  . History of CVA (cerebrovascular accident) 08/05/2014   post op cerebral angiogram, cerebral thrombosis w/ cerebral infarction;  11-02-2017  per pt no residuals  . History of malignant melanoma of skin 12/2015   s/p wide local excision right lower leg (per pt localized)  . History of rheumatic fever as a child   . Hypertension   . Intracranial aneurysm dx 07/ 2015   paraophthalmic RICA aneurysm /   s/p  Pipeline embolization right ICA 01-09-2014  . Jaundice 09/28/2017  . Malignant neoplasm of central portion of left breast in female, estrogen receptor positive [name]) oncologist-  dr Lindi Adie   dx 07/ 2015--- DCIS, ER/PR positive-- s/p  left breast lumpectomy 09-19-2013,  completed radiation 11-12-2013,  started arimidex 11-12-2013  . OA (osteoarthritis)    knees, hands,  shoulder  . Osteopenia 10/16/2012   Diffuse  . Personal history of radiation therapy   . 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 Weakness 09/28/2017  . Wears dentures    upper  . Wears glasses   . Wears hearing aid in both ears    Past Surgical History:  Procedure Laterality Date  . BREAST LUMPECTOMY Left 2015  . BREAST LUMPECTOMY WITH NEEDLE LOCALIZATION Left 09/19/2013    Procedure: LEFT BREAST LUMPECTOMY WITH DOUBLE WIRE BRACKETED  NEEDLE LOCALIZATION;  Surgeon: [name], MD;  Location: [place];  Service: General;  Laterality: Left;  . CATARACT EXTRACTION W/ INTRAOCULAR LENS  IMPLANT, BILATERAL  06/2015  . [place]  . CHOLECYSTECTOMY OPEN  1970s  . CYSTOSCOPY W/ URETERAL STENT PLACEMENT Left 11/07/2017   Procedure: CYSTOSCOPY WITH RETROGRADE PYELOGRAM/URETERAL STENT PLACEMENT;  Surgeon: Cleon Gustin, MD;  Location: [facility];  Service: Urology;  Laterality: Left;  . FINGER SURGERY     lt thumb  from dog bite  . MELANOMA EXCISION Right 12/28/2015   Procedure: EXCISION MELANOMA RIGHT LOWER LEG;  Surgeon: Fanny Skates, MD;  Location: [place];  Service: General;  Laterality: Right;  . RADIOLOGY WITH ANESTHESIA N/A 01/09/2014   Procedure: Embolization;  Surgeon: Consuella Lose, MD;  Location: [place];  Service: Radiology;  Laterality: N/A;  . SPLENECTOMY, TOTAL  2009 approx.   splen injury due to fall off horse  . TONSILLECTOMY  child  . TRANSURETHRAL RESECTION OF BLADDER TUMOR N/A 11/07/2017   Procedure: TRANSURETHRAL RESECTION OF BLADDER TUMOR (TURBT), POSSIBLE STENT PLACEMENT;  Surgeon: Cleon Gustin, MD;  Location: [facility];  Service: Urology;  Laterality: N/A;    Home Medications:  Current Facility-Administered Medications  Medication Dose Route Frequency Provider Last Rate Last Dose  . 0.9 %  sodium chloride infusion   Intravenous Continuous Effie Berkshire, MD 50 mL/hr at 12/14/17 1100    . ceFAZolin (ANCEF) IVPB 2g/100 mL premix  2 g Intravenous 30 min Pre-Op Jedi Catalfamo, Candee Furbish, MD       Allergies: No Known Allergies  Family History  Problem Relation Age of Onset  . Cancer Mother        kidney  . Hypertension Mother   . Heart attack Paternal Grandfather   . Breast cancer Maternal Aunt 80  . Breast cancer Paternal Aunt 5   Social History:  reports that she  quit smoking about 10 years ago. Her smoking use included cigarettes. She has a 60.00 pack-year smoking history. She has never used smokeless tobacco. She reports that she does not drink alcohol or use drugs.  Review of Systems  All other systems reviewed and are negative.   Physical Exam:  Vital signs in last 24 hours: Temp:  [97.8 F (36.6 C)] 97.8 F (36.6 C) (11/07 1021) Pulse Rate:  [81] 81 (11/07 1021) Resp:  [18] 18 (11/07 1021) BP: (133)/(100) 133/100 (11/07 1021) SpO2:  [95 %] 95 % (11/07 1021) Weight:  [85.5 kg] 85.5 kg (11/07 1021) Physical Exam  Constitutional: She is oriented to person, place, and time. She appears well-developed and well-nourished.  HENT:  Head: Normocephalic and atraumatic.  Eyes: Pupils are equal, round, and reactive to light. EOM are normal.  Neck: Normal range of motion. No thyromegaly present.  Cardiovascular: Normal rate and regular rhythm.  Respiratory: Effort normal. No respiratory distress.  GI: Soft. She exhibits no distension.  Musculoskeletal: Normal range of motion. She exhibits no edema.  Neurological: She is alert and oriented to person, place, and time.  Skin: Skin is warm and dry.  Psychiatric: She has a normal mood and affect. Her behavior is normal. Judgment and thought content normal.    Laboratory Data:  Results for orders placed or performed during the hospital encounter of 12/14/17 (from the past 24 hour(s))  I-STAT, chem 8     Status: Abnormal   Collection Time: 12/14/17 11:10 AM  Result Value Ref Range   Sodium 141 135 - 145 mmol/L   Potassium 4.6 3.5 - 5.1 mmol/L   Chloride 108 98 - 111 mmol/L   BUN 26 (H) 8 - 23 mg/dL   Creatinine, Ser 1.50 (H) 0.44 - 1.00 mg/dL   Glucose, Bld 141 (H) 70 - 99 mg/dL   Calcium, Ion 1.26 1.15 - 1.40 mmol/L   TCO2 23 22 - 32 mmol/L   Hemoglobin 13.3 12.0 - 15.0 g/dL   HCT 39.0 36.0 - 46.0 %   No results found for this or any previous visit (from the past 240  hour(s)). Creatinine: Recent Labs    12/14/17 1110  CREATININE 1.50*   Baseline Creatinine: 1.5  Impression/Assessment:  77yo with bladder cancer  Plan:  The risks/benefits/alterantioves to TURBT was explained to the patient and he understands and wishes to proceed with surgery  Nicolette Bang 12/14/2017, 12:36 PM

## 2017-12-15 ENCOUNTER — Encounter (HOSPITAL_BASED_OUTPATIENT_CLINIC_OR_DEPARTMENT_OTHER): Payer: Self-pay | Admitting: Urology

## 2017-12-21 NOTE — Op Note (Signed)
Preoperative diagnosis: Bladder Tumor  Postop diagnosis: Same  Procedure: 1.  Cystoscopy 2. Transurethral resection of bladder tumor, small 3. Left 6x24 JJ ureteral Stent exchange   Attending: Nicolette Bang  Anesthesia: General  Estimated blood loss: 5 cc  Drains: 1. 22 French Foley catheter 2. Left 6x24 JJ ureteral Stent  Specimens: Bladder tumor  Antibiotics: Ancef  Findings: residual papillary tumor at the left ureteral orifice  Indications: Patient is a 77 year old with a history of  bladder tumor found on office cystoscopy and a prior resection which showed high grade bladder cancer.   After discussing treatment options patient decided to proceed with repeat transurethral resection of bladder tumor  Procedure in detail: Prior to procedure consetn was obtained. Patient was brought to the operating room and briefing was done sure correct patient, correct procedure, correct site.  General anesthesia was in administered patient was placed in the dorsal lithotomy position.  The rigid 19 French cystoscope was passed urethra and bladder.  Bladder was inspected masses or lesions and we noted a 1 cm lesion involving the left ureteral orifice. We used the bipolar resectoscope to remove the residual tumor. During the resection the stent was partially melted. Using a grasper the stent was brought to the urethral meatus. A zipwire was advanced through the stent and up to the renal pelvis. The stent was then removed.  We then placed a 6 x 26 double-J ureteral stent over the wire. The wire was removed and good coiling was noted in the bladder under direct vision Once good hemostasis was noted the bladder was then drained and a 22 French Foley catheter was placed. This concluded the procedure which resulted by the patient.  Complications: None  Condition: Stable,  extubated, transferred to PACU.  Plan: The patient is to be discharged home and followup in 1 week for a voiding trial and 2 weeks  for stent removal

## 2017-12-22 DIAGNOSIS — C672 Malignant neoplasm of lateral wall of bladder: Secondary | ICD-10-CM | POA: Diagnosis not present

## 2017-12-27 ENCOUNTER — Ambulatory Visit: Payer: Medicare PPO | Admitting: *Deleted

## 2017-12-29 DIAGNOSIS — C672 Malignant neoplasm of lateral wall of bladder: Secondary | ICD-10-CM | POA: Diagnosis not present

## 2018-01-17 DIAGNOSIS — I639 Cerebral infarction, unspecified: Secondary | ICD-10-CM | POA: Diagnosis not present

## 2018-01-17 DIAGNOSIS — I671 Cerebral aneurysm, nonruptured: Secondary | ICD-10-CM | POA: Diagnosis not present

## 2018-01-22 DIAGNOSIS — I671 Cerebral aneurysm, nonruptured: Secondary | ICD-10-CM | POA: Diagnosis not present

## 2018-01-23 DIAGNOSIS — R300 Dysuria: Secondary | ICD-10-CM | POA: Diagnosis not present

## 2018-01-23 DIAGNOSIS — C672 Malignant neoplasm of lateral wall of bladder: Secondary | ICD-10-CM | POA: Diagnosis not present

## 2018-01-30 DIAGNOSIS — N390 Urinary tract infection, site not specified: Secondary | ICD-10-CM | POA: Diagnosis not present

## 2018-02-06 DIAGNOSIS — Z5111 Encounter for antineoplastic chemotherapy: Secondary | ICD-10-CM | POA: Diagnosis not present

## 2018-02-06 DIAGNOSIS — C672 Malignant neoplasm of lateral wall of bladder: Secondary | ICD-10-CM | POA: Diagnosis not present

## 2018-02-07 DIAGNOSIS — C679 Malignant neoplasm of bladder, unspecified: Secondary | ICD-10-CM

## 2018-02-07 HISTORY — DX: Malignant neoplasm of bladder, unspecified: C67.9

## 2018-02-09 ENCOUNTER — Inpatient Hospital Stay: Payer: Medicare PPO | Admitting: Hematology and Oncology

## 2018-02-09 DIAGNOSIS — C679 Malignant neoplasm of bladder, unspecified: Secondary | ICD-10-CM | POA: Insufficient documentation

## 2018-02-09 NOTE — Assessment & Plan Note (Signed)
Transurethral resection of the bladder tumor 11/07/2017: Infiltrative high-grade papillary urothelial carcinoma involves lamina propria at the level below muscularis mucosa.  Muscularis propria is present and not involved  12/14/2017: TURBT: No evidence of malignancy Patient is under the care of Dr. Nicolette Bang

## 2018-02-09 NOTE — Assessment & Plan Note (Signed)
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 Patient denies any hot flashes or myalgias 2. Bone density March 2018: T score -1.5, no change compared to before  Breast cancer surveillance: 1. Breast exam 02/09/2018 benign  2. Mammograms  11/27/2017: Breast density category B, normal  RTC 1 yr

## 2018-02-12 ENCOUNTER — Telehealth: Payer: Self-pay | Admitting: Hematology and Oncology

## 2018-02-12 NOTE — Telephone Encounter (Signed)
called patient per sch message - pt is aware of r/s from 1/3 to 1/20

## 2018-02-13 DIAGNOSIS — Z5111 Encounter for antineoplastic chemotherapy: Secondary | ICD-10-CM | POA: Diagnosis not present

## 2018-02-13 DIAGNOSIS — C672 Malignant neoplasm of lateral wall of bladder: Secondary | ICD-10-CM | POA: Diagnosis not present

## 2018-02-20 DIAGNOSIS — C672 Malignant neoplasm of lateral wall of bladder: Secondary | ICD-10-CM | POA: Diagnosis not present

## 2018-02-20 DIAGNOSIS — Z5111 Encounter for antineoplastic chemotherapy: Secondary | ICD-10-CM | POA: Diagnosis not present

## 2018-02-26 ENCOUNTER — Inpatient Hospital Stay: Payer: Medicare PPO | Attending: Hematology and Oncology | Admitting: Hematology and Oncology

## 2018-02-26 ENCOUNTER — Telehealth: Payer: Self-pay | Admitting: Hematology and Oncology

## 2018-02-26 DIAGNOSIS — Z7982 Long term (current) use of aspirin: Secondary | ICD-10-CM | POA: Insufficient documentation

## 2018-02-26 DIAGNOSIS — Z923 Personal history of irradiation: Secondary | ICD-10-CM | POA: Diagnosis not present

## 2018-02-26 DIAGNOSIS — Z79811 Long term (current) use of aromatase inhibitors: Secondary | ICD-10-CM | POA: Diagnosis not present

## 2018-02-26 DIAGNOSIS — Z170 Estrogen receptor positive status [ER+]: Secondary | ICD-10-CM | POA: Insufficient documentation

## 2018-02-26 DIAGNOSIS — C50112 Malignant neoplasm of central portion of left female breast: Secondary | ICD-10-CM | POA: Diagnosis not present

## 2018-02-26 DIAGNOSIS — C679 Malignant neoplasm of bladder, unspecified: Secondary | ICD-10-CM | POA: Diagnosis not present

## 2018-02-26 DIAGNOSIS — Z79899 Other long term (current) drug therapy: Secondary | ICD-10-CM | POA: Diagnosis not present

## 2018-02-26 MED ORDER — ANASTROZOLE 1 MG PO TABS: 1.0000 mg | ORAL_TABLET | Freq: Every morning | ORAL | 2 refills | Status: DC

## 2018-02-26 NOTE — Telephone Encounter (Signed)
Gave avs and calendar ° °

## 2018-02-26 NOTE — Assessment & Plan Note (Signed)
Transurethral resection of the bladder tumor 11/07/2017: Infiltrative high-grade papillary urothelial carcinoma involves lamina propria at the level below muscularis mucosa.  Muscularis propria is present and not involved  12/14/2017: TURBT: No evidence of malignancy Patient is under the care of Dr. Nicolette Bang

## 2018-02-26 NOTE — Assessment & Plan Note (Signed)
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 Patient denies any hot flashes or myalgias 2. Bone density March 2018: T score -1.5, no change compared to before  Breast cancer surveillance: 1. Breast exam 02/26/2018 benign  2. Mammograms  11/27/2017: Breast density category B, normal  RTC 1 yr

## 2018-02-26 NOTE — Progress Notes (Signed)
Patient Care Team: Lucille Passy, MD as PCP - General (Family Medicine) Fanny Skates, MD as Consulting Physician (General Surgery) Thea Silversmith, MD as Consulting Physician (Radiation Oncology) Nicholas Lose, MD as Consulting Physician (Hematology and Oncology) Garvin Fila, MD as Consulting Physician (Neurology) Consuella Lose, MD as Consulting Physician (Neurosurgery) Katy Apo, MD as Consulting Physician (Ophthalmology)  DIAGNOSIS:  Encounter Diagnoses  Name Primary?  . Malignant neoplasm of central portion of left breast in female, estrogen receptor positive ([place])   . Malignant neoplasm of urinary bladder, unspecified site ([place])     SUMMARY OF ONCOLOGIC HISTORY:   Cancer of central portion of left female breast ([place])   08/20/2013 Initial Diagnosis    Left breast 12:00: DCIS with calcifications, yet 100%, PR 100%; 09/04/2013 second group of calcifications also biopsied proven to be DCIS ER/PR positive    09/19/2013 Surgery    Left breast lumpectomy DCIS 2 foci margins negative, 0.2 and 0.3 cm ER 100% PR 100%    10/08/2013 - 11/12/2013 Radiation Therapy    Adjuvant radiation therapy    11/12/2013 -  Anti-estrogen oral therapy    Anastrozole 1 mg daily plan is for 5 years    01/09/2014 - 01/10/2014 Hospital Admission    Pipeline embolization RICA aneurysm in the brain     Bladder cancer ([place])   02/09/2018 Initial Diagnosis    Bladder cancer ([place])     CHIEF COMPLIANT: Follow-up of DCIS on anastrozole.  Recent bladder cancer being treated with BCG  INTERVAL HISTORY: Briana Ochoa is a 78 year old with above-mentioned history of left breast cancer with lumpectomy radiation and is currently taking anastrozole.  She will finish anastrozole back to work 2020.  She does not report any hot flashes or myalgias.  She was diagnosed with bladder cancer and has undergone transurethral resection of the bladder tumor and is currently getting BCG treatments once a  week and she completed 4 out of 6 so far.  REVIEW OF SYSTEMS:   Constitutional: Denies fevers, chills or abnormal weight loss Eyes: Denies blurriness of vision Ears, nose, mouth, throat, and face: Denies mucositis or sore throat Respiratory: Denies cough, dyspnea or wheezes Cardiovascular: Denies palpitation, chest discomfort Gastrointestinal:  Denies nausea, heartburn or change in bowel habits Skin: Denies abnormal skin rashes Lymphatics: Denies new lymphadenopathy or easy bruising Neurological:Denies numbness, tingling or new weaknesses Behavioral/Psych: Mood is stable, no new changes  Extremities: No lower extremity edema Breast:  denies any pain or lumps or nodules in either breasts All other systems were reviewed with the patient and are negative.  I have reviewed the past medical history, past surgical history, social history and family history with the patient and they are unchanged from previous note.  ALLERGIES:  has No Known Allergies.  MEDICATIONS:  Current Outpatient Medications  Medication Sig Dispense Refill  . acetaminophen (TYLENOL) 325 MG tablet Take 650 mg by mouth daily as needed (pain).    [name] anastrozole (ARIMIDEX) 1 MG tablet Take 1 tablet (1 mg total) by mouth daily. (Patient taking differently: Take 1 mg by mouth every morning. ) 90 tablet 3  . aspirin EC 81 MG tablet Take 81 mg by mouth daily.    [name] glucose blood (TRUE METRIX BLOOD GLUCOSE TEST) test strip Use as instructed to test blood sugar once daily E08.638 100 each 2  . HYDROcodone-acetaminophen (NORCO/VICODIN) 5-325 MG tablet Take 1 tablet by mouth every 4 (four) hours as needed for moderate pain. 30 tablet 0  .  lisinopril (PRINIVIL,ZESTRIL) 20 MG tablet Take 1 tablet (20 mg total) by mouth daily. 90 tablet 1  . pravastatin (PRAVACHOL) 20 MG tablet TAKE 1 TABLET (20 MG TOTAL) BY MOUTH DAILY. 90 tablet 1   No current facility-administered medications for this visit.     PHYSICAL EXAMINATION: ECOG  PERFORMANCE STATUS: 1 - Symptomatic but completely ambulatory  Vitals:   02/26/18 1155  BP: (!) 155/92  Pulse: 70  Resp: 17  Temp: 98.1 F (36.7 C)  SpO2: 97%   Filed Weights   02/26/18 1155  Weight: 194 lb 12.8 oz (88.4 kg)    GENERAL:alert, no distress and comfortable SKIN: skin color, texture, turgor are normal, no rashes or significant lesions EYES: normal, Conjunctiva are pink and non-injected, sclera clear OROPHARYNX:no exudate, no erythema and lips, buccal mucosa, and tongue normal  NECK: supple, thyroid normal size, non-tender, without nodularity LYMPH:  no palpable lymphadenopathy in the cervical, axillary or inguinal LUNGS: clear to auscultation and percussion with normal breathing effort HEART: regular rate & rhythm and no murmurs and no lower extremity edema ABDOMEN:abdomen soft, non-tender and normal bowel sounds MUSCULOSKELETAL:no cyanosis of digits and no clubbing  NEURO: alert & oriented x 3 with fluent speech, no focal motor/sensory deficits EXTREMITIES: No lower extremity edema BREAST: No palpable masses or nodules in either right or left breasts. No palpable axillary supraclavicular or infraclavicular adenopathy no breast tenderness or nipple discharge. (exam performed in the presence of a chaperone)  LABORATORY DATA:  I have reviewed the data as listed CMP Latest Ref Rng & Units 12/14/2017 11/07/2017 09/28/2017  Glucose 70 - 99 mg/dL 141(H) 152(H) 118(H)  BUN 8 - 23 mg/dL 26(H) - 31(H)  Creatinine 0.44 - 1.00 mg/dL 1.50(H) - 1.54(H)  Sodium 135 - 145 mmol/L 141 141 140  Potassium 3.5 - 5.1 mmol/L 4.6 4.1 4.7  Chloride 98 - 111 mmol/L 108 - 105  CO2 19 - 32 mEq/L - - 27  Calcium 8.4 - 10.5 mg/dL - - 9.7  Total Protein 6.0 - 8.3 g/dL - - 6.4  Total Bilirubin 0.2 - 1.2 mg/dL - - 0.3  Alkaline Phos 39 - 117 U/L - - 81  AST 0 - 37 U/L - - 11  ALT 0 - 35 U/L - - 8    Lab Results  Component Value Date   WBC 12.1 (H) 09/28/2017   HGB 13.3 12/14/2017   HCT  39.0 12/14/2017   MCV 90.5 09/28/2017   PLT 374.0 09/28/2017   NEUTROABS 8.2 (H) 09/28/2017    ASSESSMENT & PLAN:  Cancer of central portion of left female breast DCIS left breast ER 100%, P 100% diagnosis 08/20/2013 status post lumpectomy 09/19/2013; 2 foci measuring 0.2 and 0.3 cm, status post adjuvant radiation therapy, currently on Arimidex since October 2015  Arimidex toxicities: 1. Patient denies any hot flashes or myalgias 2. Bone density March 2018: T score -1.5, no change compared to before  Breast cancer surveillance: 1. Breast exam 02/26/2018 benign  2. Mammograms  11/27/2017: Breast density category B, normal  RTC 1 yr  Bladder cancer ([place]) Transurethral resection of the bladder tumor 11/07/2017: Infiltrative high-grade papillary urothelial carcinoma involves lamina propria at the level below muscularis mucosa.  Muscularis propria is present and not involved  12/14/2017: TURBT: No evidence of malignancy Currently patient is receiving BCG treatments. Patient is under the care of Dr. Nicolette Bang    No orders of the defined types were placed in this encounter.  The patient has a  good understanding of the overall plan. she agrees with it. she will call with any problems that may develop before the next visit here.   Harriette Ohara, MD 02/26/18

## 2018-02-27 DIAGNOSIS — Z5111 Encounter for antineoplastic chemotherapy: Secondary | ICD-10-CM | POA: Diagnosis not present

## 2018-02-27 DIAGNOSIS — C672 Malignant neoplasm of lateral wall of bladder: Secondary | ICD-10-CM | POA: Diagnosis not present

## 2018-03-06 DIAGNOSIS — Z5111 Encounter for antineoplastic chemotherapy: Secondary | ICD-10-CM | POA: Diagnosis not present

## 2018-03-06 DIAGNOSIS — C672 Malignant neoplasm of lateral wall of bladder: Secondary | ICD-10-CM | POA: Diagnosis not present

## 2018-03-13 DIAGNOSIS — Z5111 Encounter for antineoplastic chemotherapy: Secondary | ICD-10-CM | POA: Diagnosis not present

## 2018-03-13 DIAGNOSIS — C672 Malignant neoplasm of lateral wall of bladder: Secondary | ICD-10-CM | POA: Diagnosis not present

## 2018-04-06 DIAGNOSIS — C672 Malignant neoplasm of lateral wall of bladder: Secondary | ICD-10-CM | POA: Diagnosis not present

## 2018-04-10 ENCOUNTER — Other Ambulatory Visit: Payer: Self-pay | Admitting: Urology

## 2018-04-16 NOTE — Progress Notes (Deleted)
Subjective:   Briana Ochoa is a 78 y.o. female who presents for Medicare Annual (Subsequent) preventive examination.  Review of Systems: No ROS.  Medicare Wellness Visit. Additional risk factors are reflected in the social history.    Sleep patterns:    Home Safety/Smoke Alarms: Feels safe in home. Smoke alarms in place.   Female:       Mammo- utd       Dexa scan-        CCS-     Objective:     Vitals: There were no vitals taken for this visit.  There is no height or weight on file to calculate BMI.  Advanced Directives 12/14/2017 11/07/2017 02/10/2016 12/28/2015 12/22/2015 10/29/2015 04/21/2015  Does Patient Have a Medical Advance Directive? No No No Yes Yes No No  Type of Advance Directive - - - Living will Living will - -  Copy of [place] in Chart? - - - No - copy requested - - -  Would patient like information on creating a medical advance directive? No - Patient declined No - Patient declined - - - Yes - Scientist, clinical (histocompatibility and immunogenetics) given -    Tobacco Social History   Tobacco Use  Smoking Status Former Smoker  . Packs/day: 1.50  . Years: 40.00  . Pack years: 60.00  . Types: Cigarettes  . Last attempt to quit: 03/06/2007  . Years since quitting: 11.1  Smokeless Tobacco Never Used     Counseling given: Not Answered   Clinical Intake:                       Past Medical History:  Diagnosis Date  . Bladder tumor   . Bloody diarrhea 09/29/2017  . CKD (chronic kidney disease), stage III ([place])   . Diabetes mellitus type 2, diet-controlled ([place])   . Dyspnea 09/28/2017  . Dysuria   . Fatigue 09/28/2017  . Frequency of urination   . Grade I diastolic dysfunction 26/33/3545   Noted on ECHO  . Gross hematuria 09/29/2017  . Heart murmur    since rheumatic fever as child  . Hematuria   . History of cardiomegaly 02/12/2005   Mild, noted on CXR  . History of CVA (cerebrovascular accident) 08/05/2014   post op cerebral angiogram,  cerebral thrombosis w/ cerebral infarction;  11-02-2017  per pt no residuals  . History of malignant melanoma of skin 12/2015   s/p wide local excision right lower leg (per pt localized)  . History of rheumatic fever as a child   . Hypertension   . Intracranial aneurysm dx 07/ 2015   paraophthalmic RICA aneurysm /   s/p  Pipeline embolization right ICA 01-09-2014  . Jaundice 09/28/2017  . Malignant neoplasm of central portion of left breast in female, estrogen receptor positive [facility]) oncologist-  dr Lindi Adie   dx 07/ 2015--- DCIS, ER/PR positive-- s/p  left breast lumpectomy 09-19-2013,  completed radiation 11-12-2013,  started arimidex 11-12-2013  . OA (osteoarthritis)    knees, hands,  shoulder  . Osteopenia 10/16/2012   Diffuse  . Personal history of radiation therapy   . 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 Weakness 09/28/2017  . Wears dentures    upper  . Wears glasses   . Wears hearing aid in both ears    Past Surgical History:  Procedure Laterality Date  . BREAST LUMPECTOMY Left 2015  . BREAST LUMPECTOMY WITH NEEDLE LOCALIZATION Left  09/19/2013   Procedure: LEFT BREAST LUMPECTOMY WITH DOUBLE WIRE BRACKETED  NEEDLE LOCALIZATION;  Surgeon: [name], MD;  Location: [facility];  Service: General;  Laterality: Left;  . CATARACT EXTRACTION W/ INTRAOCULAR LENS  IMPLANT, BILATERAL  06/2015  . [place]  . CHOLECYSTECTOMY OPEN  1970s  . CYSTOSCOPY W/ URETERAL STENT PLACEMENT Left 11/07/2017   Procedure: CYSTOSCOPY WITH RETROGRADE PYELOGRAM/URETERAL STENT PLACEMENT;  Surgeon: Cleon Gustin, MD;  Location: [facility];  Service: Urology;  Laterality: Left;  . FINGER SURGERY     lt thumb  from dog bite  . MELANOMA EXCISION Right 12/28/2015   Procedure: EXCISION MELANOMA RIGHT LOWER LEG;  Surgeon: Fanny Skates, MD;  Location: [facility];  Service: General;  Laterality: Right;  . RADIOLOGY WITH ANESTHESIA N/A 01/09/2014    Procedure: Embolization;  Surgeon: Consuella Lose, MD;  Location: [facility];  Service: Radiology;  Laterality: N/A;  . SPLENECTOMY, TOTAL  2009 approx.   splen injury due to fall off horse  . TONSILLECTOMY  child  . TRANSURETHRAL RESECTION OF BLADDER TUMOR N/A 11/07/2017   Procedure: TRANSURETHRAL RESECTION OF BLADDER TUMOR (TURBT), POSSIBLE STENT PLACEMENT;  Surgeon: Cleon Gustin, MD;  Location: [facility];  Service: Urology;  Laterality: N/A;  . TRANSURETHRAL RESECTION OF BLADDER TUMOR N/A 12/14/2017   Procedure: TRANSURETHRAL RESECTION OF BLADDER TUMOR (TURBT);  Surgeon: Cleon Gustin, MD;  Location: [facility];  Service: Urology;  Laterality: N/A;  30 MINS   Family History  Problem Relation Age of Onset  . Cancer Mother        kidney  . Hypertension Mother   . Heart attack Paternal Grandfather   . Breast cancer Maternal Aunt 80  . Breast cancer Paternal Aunt 68   Social History   Socioeconomic History  . Marital status: Widowed    Spouse name: Not on file  . Number of children: Not on file  . Years of education: Not on file  . Highest education level: Not on file  Occupational History    Comment: Retired  Scientific laboratory technician  . Financial resource strain: Not on file  . Food insecurity:    Worry: Not on file    Inability: Not on file  . Transportation needs:    Medical: Not on file    Non-medical: Not on file  Tobacco Use  . Smoking status: Former Smoker    Packs/day: 1.50    Years: 40.00    Pack years: 60.00    Types: Cigarettes    Last attempt to quit: 03/06/2007    Years since quitting: 11.1  . Smokeless tobacco: Never Used  Substance and Sexual Activity  . Alcohol use: No  . Drug use: Never  . Sexual activity: Not on file  Lifestyle  . Physical activity:    Days per week: Not on file    Minutes per session: Not on file  . Stress: Not on file  Relationships  . Social connections:    Talks on phone: Not on file    Gets  together: Not on file    Attends religious service: Not on file    Active member of club or organization: Not on file    Attends meetings of clubs or organizations: Not on file    Relationship status: Not on file  Other Topics Concern  . Not on file  Social History Narrative   Widowed 2013   5 children  17 grandchildren      Does not have a living will-desires CPR, does not want prolonged life support if futile.    Outpatient Encounter Medications as of 04/18/2018  Medication Sig  . acetaminophen (TYLENOL) 325 MG tablet Take 650 mg by mouth daily as needed (pain).  [name] anastrozole (ARIMIDEX) 1 MG tablet Take 1 tablet (1 mg total) by mouth every morning.  [name] aspirin EC 81 MG tablet Take 81 mg by mouth daily.  [name] glucose blood (TRUE METRIX BLOOD GLUCOSE TEST) test strip Use as instructed to test blood sugar once daily E08.638  . lisinopril (PRINIVIL,ZESTRIL) 20 MG tablet Take 1 tablet (20 mg total) by mouth daily.  . pravastatin (PRAVACHOL) 20 MG tablet TAKE 1 TABLET (20 MG TOTAL) BY MOUTH DAILY.   No facility-administered encounter medications on file as of 04/18/2018.     Activities of Daily Living In your present state of health, do you have any difficulty performing the following activities: 12/14/2017 11/07/2017  Hearing? N Y  Comment - bil hearing aids  left at home  Vision? N N  Difficulty concentrating or making decisions? N N  Walking or climbing stairs? Y Y  Comment - due to knees  Dressing or bathing? N N  Some recent data might be hidden    Patient Care Team: Lucille Passy, MD as PCP - General (Family Medicine) Fanny Skates, MD as Consulting Physician (General Surgery) Thea Silversmith, MD as Consulting Physician (Radiation Oncology) Nicholas Lose, MD as Consulting Physician (Hematology and Oncology) Garvin Fila, MD as Consulting Physician (Neurology) Consuella Lose, MD as Consulting Physician (Neurosurgery) Katy Apo, MD as Consulting Physician  (Ophthalmology)    Assessment:   This is a routine wellness examination for Naima. Physical assessment deferred to PCP.  Exercise Activities and Dietary recommendations   Diet (meal preparation, eat out, water intake, caffeinated beverages, dairy products, fruits and vegetables): {Desc; diets:16563} Breakfast: Lunch:  Dinner:      Goals    . Increase water intake     Starting 10/29/2015, I will continue to drink at least 6-8 glasses of water daily.       Fall Risk Fall Risk  01/02/2017 02/09/2016 10/29/2015 04/07/2015 10/28/2014  Falls in the past year? No No No No No     Depression Screen PHQ 2/9 Scores 01/02/2017 10/29/2015 10/28/2014 10/28/2014  PHQ - 2 Score 0 0 0 0     Cognitive Function MMSE - Mini Mental State Exam 10/29/2015  Orientation to time 5  Orientation to Place 5  Registration 3  Attention/ Calculation 0  Recall 3  Language- name 2 objects 0  Language- repeat 1  Language- follow 3 step command 3  Language- read & follow direction 0  Write a sentence 0  Copy design 0  Total score 20        Immunization History  Administered Date(s) Administered  . DTaP 07/17/2012  . Influenza, High Dose Seasonal PF 12/08/2017  . Influenza, Quadrivalent, Recombinant, Inj, Pf 11/12/2016  . Influenza,inj,Quad PF,6+ Mos 02/22/2013, 12/17/2013, 10/29/2015  . Pneumococcal Conjugate-13 10/28/2014  . Pneumococcal Polysaccharide-23 10/29/2015  . Tdap 01/02/2017    Qualifies for Shingles Vaccine?***  Screening Tests Health Maintenance  Topic Date Due  . FOOT EXAM  01/02/2018  . HEMOGLOBIN A1C  03/31/2018  . OPHTHALMOLOGY EXAM  10/28/2018  . TETANUS/TDAP  01/03/2027  . INFLUENZA VACCINE  Completed  . DEXA SCAN  Completed  . PNA vac Low Risk Adult  Completed  Plan:   ***   I have personally reviewed and noted the following in the patient's chart:   . Medical and social history . Use of alcohol, tobacco or illicit drugs  . Current medications and  supplements . Functional ability and status . Nutritional status . Physical activity . Advanced directives . List of other physicians . Hospitalizations, surgeries, and ER visits in previous 12 months . Vitals . Screenings to include cognitive, depression, and falls . Referrals and appointments  In addition, I have reviewed and discussed with patient certain preventive protocols, quality metrics, and best practice recommendations. A written personalized care plan for preventive services as well as general preventive health recommendations were provided to patient.     Shela Nevin, [place]  04/16/2018

## 2018-04-18 ENCOUNTER — Other Ambulatory Visit: Payer: Self-pay | Admitting: Family Medicine

## 2018-04-18 ENCOUNTER — Ambulatory Visit: Payer: Medicare PPO | Admitting: *Deleted

## 2018-04-23 ENCOUNTER — Encounter (HOSPITAL_BASED_OUTPATIENT_CLINIC_OR_DEPARTMENT_OTHER): Payer: Self-pay

## 2018-04-23 ENCOUNTER — Other Ambulatory Visit: Payer: Self-pay

## 2018-04-23 NOTE — Progress Notes (Signed)
Spoke with:  Briana Ochoa NPO:  After Midnight, no gum, candy, or mints Arrival time: 1000 Labs:  BMP (EKG chart/epic) AM medications:  Tylenol, Anastrazole, Pravastatin   Pre op orders:Yes Ride home: Suanne Marker (daughter) [phone]

## 2018-04-27 ENCOUNTER — Other Ambulatory Visit: Payer: Self-pay

## 2018-04-27 ENCOUNTER — Encounter (HOSPITAL_BASED_OUTPATIENT_CLINIC_OR_DEPARTMENT_OTHER): Payer: Self-pay | Admitting: *Deleted

## 2018-04-27 ENCOUNTER — Encounter (HOSPITAL_COMMUNITY)
Admission: RE | Disposition: A | Discharge status: Home / Self Care | Payer: Self-pay | Source: Home / Self Care | Attending: Urology

## 2018-04-27 ENCOUNTER — Observation Stay (HOSPITAL_BASED_OUTPATIENT_CLINIC_OR_DEPARTMENT_OTHER)
Admission: RE | Admit: 2018-04-27 | Discharge: 2018-04-29 | Disposition: A | Discharge status: Home / Self Care | Payer: Medicare PPO | Attending: Urology | Admitting: Urology

## 2018-04-27 ENCOUNTER — Ambulatory Visit (HOSPITAL_BASED_OUTPATIENT_CLINIC_OR_DEPARTMENT_OTHER): Payer: Medicare PPO | Admitting: Anesthesiology

## 2018-04-27 ENCOUNTER — Observation Stay (HOSPITAL_COMMUNITY): Payer: Medicare PPO

## 2018-04-27 DIAGNOSIS — Z79899 Other long term (current) drug therapy: Secondary | ICD-10-CM | POA: Diagnosis not present

## 2018-04-27 DIAGNOSIS — N183 Chronic kidney disease, stage 3 (moderate): Secondary | ICD-10-CM | POA: Insufficient documentation

## 2018-04-27 DIAGNOSIS — I5030 Unspecified diastolic (congestive) heart failure: Secondary | ICD-10-CM | POA: Diagnosis not present

## 2018-04-27 DIAGNOSIS — C679 Malignant neoplasm of bladder, unspecified: Secondary | ICD-10-CM | POA: Diagnosis not present

## 2018-04-27 DIAGNOSIS — Z923 Personal history of irradiation: Secondary | ICD-10-CM | POA: Insufficient documentation

## 2018-04-27 DIAGNOSIS — Z85820 Personal history of malignant melanoma of skin: Secondary | ICD-10-CM | POA: Diagnosis not present

## 2018-04-27 DIAGNOSIS — M1389 Other specified arthritis, multiple sites: Secondary | ICD-10-CM | POA: Diagnosis not present

## 2018-04-27 DIAGNOSIS — E1122 Type 2 diabetes mellitus with diabetic chronic kidney disease: Secondary | ICD-10-CM | POA: Insufficient documentation

## 2018-04-27 DIAGNOSIS — N1330 Unspecified hydronephrosis: Secondary | ICD-10-CM

## 2018-04-27 DIAGNOSIS — R310 Gross hematuria: Secondary | ICD-10-CM | POA: Diagnosis not present

## 2018-04-27 DIAGNOSIS — I130 Hypertensive heart and chronic kidney disease with heart failure and stage 1 through stage 4 chronic kidney disease, or unspecified chronic kidney disease: Secondary | ICD-10-CM | POA: Insufficient documentation

## 2018-04-27 DIAGNOSIS — D494 Neoplasm of unspecified behavior of bladder: Secondary | ICD-10-CM | POA: Diagnosis not present

## 2018-04-27 DIAGNOSIS — Z7982 Long term (current) use of aspirin: Secondary | ICD-10-CM | POA: Insufficient documentation

## 2018-04-27 DIAGNOSIS — Z87891 Personal history of nicotine dependence: Secondary | ICD-10-CM | POA: Insufficient documentation

## 2018-04-27 DIAGNOSIS — I110 Hypertensive heart disease with heart failure: Secondary | ICD-10-CM | POA: Diagnosis not present

## 2018-04-27 DIAGNOSIS — Z8673 Personal history of transient ischemic attack (TIA), and cerebral infarction without residual deficits: Secondary | ICD-10-CM | POA: Insufficient documentation

## 2018-04-27 DIAGNOSIS — N132 Hydronephrosis with renal and ureteral calculous obstruction: Secondary | ICD-10-CM | POA: Diagnosis not present

## 2018-04-27 DIAGNOSIS — C678 Malignant neoplasm of overlapping sites of bladder: Secondary | ICD-10-CM | POA: Diagnosis not present

## 2018-04-27 HISTORY — PX: CYSTOSCOPY W/ URETERAL STENT PLACEMENT: SHX1429

## 2018-04-27 HISTORY — DX: Malignant neoplasm of bladder, unspecified: C67.9

## 2018-04-27 HISTORY — PX: TRANSURETHRAL RESECTION OF BLADDER TUMOR: SHX2575

## 2018-04-27 HISTORY — DX: Diverticulosis of large intestine without perforation or abscess without bleeding: K57.30

## 2018-04-27 HISTORY — DX: Atherosclerosis of aorta: I70.0

## 2018-04-27 LAB — BASIC METABOLIC PANEL
Anion gap: 8 (ref 5–15)
BUN: 22 mg/dL (ref 8–23)
CO2: 26 mmol/L (ref 22–32)
Calcium: 9.3 mg/dL (ref 8.9–10.3)
Chloride: 107 mmol/L (ref 98–111)
Creatinine, Ser: 1.54 mg/dL — ABNORMAL HIGH (ref 0.44–1.00)
GFR calc Af Amer: 37 mL/min — ABNORMAL LOW (ref >60)
GFR calc non Af Amer: 32 mL/min — ABNORMAL LOW (ref >60)
Glucose, Bld: 144 mg/dL — ABNORMAL HIGH (ref 70–99)
Potassium: 5.2 mmol/L — ABNORMAL HIGH (ref 3.5–5.1)
Sodium: 141 mmol/L (ref 135–145)

## 2018-04-27 LAB — GLUCOSE, CAPILLARY
Glucose-Capillary: 106 mg/dL — ABNORMAL HIGH (ref 70–99)
Glucose-Capillary: 173 mg/dL — ABNORMAL HIGH (ref 70–99)

## 2018-04-27 SURGERY — TURBT (TRANSURETHRAL RESECTION OF BLADDER TUMOR)
Anesthesia: General

## 2018-04-27 MED ORDER — ONDANSETRON HCL 4 MG/2ML IJ SOLN
INTRAMUSCULAR | Status: DC | PRN
  Administered 2018-04-27: 4 mg via INTRAVENOUS

## 2018-04-27 MED ORDER — LIDOCAINE HCL (CARDIAC) PF 100 MG/5ML IV SOSY
PREFILLED_SYRINGE | INTRAVENOUS | Status: DC | PRN
  Administered 2018-04-27: 60 mg via INTRAVENOUS

## 2018-04-27 MED ORDER — ONDANSETRON HCL 4 MG/2ML IJ SOLN
4.0000 mg | Freq: Once | INTRAMUSCULAR | Status: DC | PRN
  Filled 2018-04-27: qty 2

## 2018-04-27 MED ORDER — CEFAZOLIN SODIUM-DEXTROSE 2-4 GM/100ML-% IV SOLN
2.0000 g | INTRAVENOUS | Status: AC
  Administered 2018-04-28: 2 g via INTRAVENOUS
  Filled 2018-04-27: qty 100

## 2018-04-27 MED ORDER — DIPHENHYDRAMINE HCL 12.5 MG/5ML PO ELIX: 12.5000 mg | ORAL_SOLUTION | Freq: Four times a day (QID) | ORAL | Status: DC | PRN

## 2018-04-27 MED ORDER — FENTANYL CITRATE (PF) 100 MCG/2ML IJ SOLN
INTRAMUSCULAR | Status: DC | PRN
  Administered 2018-04-27 (×4): 25 ug via INTRAVENOUS

## 2018-04-27 MED ORDER — OXYCODONE HCL 5 MG PO TABS
5.0000 mg | ORAL_TABLET | Freq: Once | ORAL | Status: DC | PRN
  Filled 2018-04-27: qty 1

## 2018-04-27 MED ORDER — SODIUM CHLORIDE 0.9 % IV SOLN
INTRAVENOUS | Status: DC
  Administered 2018-04-27: 50 mL/h via INTRAVENOUS
  Filled 2018-04-27: qty 1000

## 2018-04-27 MED ORDER — CEFAZOLIN SODIUM-DEXTROSE 2-4 GM/100ML-% IV SOLN
2.0000 g | INTRAVENOUS | Status: AC
  Administered 2018-04-27: 2 g via INTRAVENOUS
  Filled 2018-04-27: qty 100

## 2018-04-27 MED ORDER — DEXAMETHASONE SODIUM PHOSPHATE 10 MG/ML IJ SOLN
INTRAMUSCULAR | Status: AC
  Filled 2018-04-27: qty 1

## 2018-04-27 MED ORDER — SODIUM CHLORIDE 0.9 % IV SOLN: INTRAVENOUS | Status: DC

## 2018-04-27 MED ORDER — ASPIRIN EC 81 MG PO TBEC
81.0000 mg | DELAYED_RELEASE_TABLET | Freq: Every day | ORAL | Status: DC
  Administered 2018-04-28 – 2018-04-29 (×2): 81 mg via ORAL
  Filled 2018-04-27 (×2): qty 1

## 2018-04-27 MED ORDER — ONDANSETRON HCL 4 MG/2ML IJ SOLN
INTRAMUSCULAR | Status: AC
  Filled 2018-04-27: qty 2

## 2018-04-27 MED ORDER — DIPHENHYDRAMINE HCL 50 MG/ML IJ SOLN: 12.5000 mg | Freq: Four times a day (QID) | INTRAMUSCULAR | Status: DC | PRN

## 2018-04-27 MED ORDER — PROPOFOL 10 MG/ML IV BOLUS
INTRAVENOUS | Status: AC
  Filled 2018-04-27: qty 20

## 2018-04-27 MED ORDER — LIDOCAINE 2% (20 MG/ML) 5 ML SYRINGE
INTRAMUSCULAR | Status: AC
  Filled 2018-04-27: qty 5

## 2018-04-27 MED ORDER — CEFAZOLIN SODIUM-DEXTROSE 2-4 GM/100ML-% IV SOLN
INTRAVENOUS | Status: AC
  Filled 2018-04-27: qty 100

## 2018-04-27 MED ORDER — FENTANYL CITRATE (PF) 100 MCG/2ML IJ SOLN
25.0000 ug | INTRAMUSCULAR | Status: DC | PRN
  Filled 2018-04-27: qty 1

## 2018-04-27 MED ORDER — PRAVASTATIN SODIUM 20 MG PO TABS
20.0000 mg | ORAL_TABLET | Freq: Every day | ORAL | Status: DC
  Administered 2018-04-28 – 2018-04-29 (×2): 20 mg via ORAL
  Filled 2018-04-27 (×2): qty 1

## 2018-04-27 MED ORDER — DEXAMETHASONE SODIUM PHOSPHATE 10 MG/ML IJ SOLN
INTRAMUSCULAR | Status: DC | PRN
  Administered 2018-04-27: 10 mg via INTRAVENOUS

## 2018-04-27 MED ORDER — FENTANYL CITRATE (PF) 100 MCG/2ML IJ SOLN
25.0000 ug | INTRAMUSCULAR | Status: DC | PRN
  Administered 2018-04-28: 50 ug via INTRAVENOUS
  Filled 2018-04-27: qty 2

## 2018-04-27 MED ORDER — OXYCODONE-ACETAMINOPHEN 5-325 MG PO TABS
1.0000 | ORAL_TABLET | ORAL | Status: DC | PRN
  Administered 2018-04-27: 2 via ORAL
  Filled 2018-04-27: qty 2

## 2018-04-27 MED ORDER — SODIUM CHLORIDE 0.9 % IR SOLN
Status: DC | PRN
  Administered 2018-04-27: 3000 mL via INTRAVESICAL

## 2018-04-27 MED ORDER — ZOLPIDEM TARTRATE 5 MG PO TABS: 5.0000 mg | ORAL_TABLET | Freq: Every evening | ORAL | Status: DC | PRN

## 2018-04-27 MED ORDER — FENTANYL CITRATE (PF) 100 MCG/2ML IJ SOLN
INTRAMUSCULAR | Status: AC
  Filled 2018-04-27: qty 2

## 2018-04-27 MED ORDER — ONDANSETRON HCL 4 MG/2ML IJ SOLN
4.0000 mg | INTRAMUSCULAR | Status: DC | PRN
  Filled 2018-04-27: qty 2

## 2018-04-27 MED ORDER — PROPOFOL 10 MG/ML IV BOLUS
INTRAVENOUS | Status: DC | PRN
  Administered 2018-04-27: 150 mg via INTRAVENOUS
  Administered 2018-04-27: 50 mg via INTRAVENOUS

## 2018-04-27 MED ORDER — OXYCODONE HCL 5 MG/5ML PO SOLN
5.0000 mg | Freq: Once | ORAL | Status: DC | PRN
  Filled 2018-04-27: qty 5

## 2018-04-27 SURGICAL SUPPLY — 28 items
BAG DRAIN URO-CYSTO SKYTR STRL (DRAIN) ×6 IMPLANT
BAG DRN ANRFLXCHMBR STRAP LEK (BAG)
BAG DRN UROCATH (DRAIN) ×4
BAG URINE DRAINAGE (UROLOGICAL SUPPLIES) ×1 IMPLANT
BAG URINE LEG 19OZ MD ST LTX (BAG) IMPLANT
CATH FOLEY 3WAY 20FR (CATHETERS) ×1 IMPLANT
CATH FOLEY 3WAY 30CC 22F (CATHETERS) ×2 IMPLANT
CATH INTERMIT  6FR 70CM (CATHETERS) ×1 IMPLANT
CLOTH BEACON ORANGE TIMEOUT ST (SAFETY) ×3 IMPLANT
ELECT REM PT RETURN 9FT ADLT (ELECTROSURGICAL) ×3
ELECTRODE REM PT RTRN 9FT ADLT (ELECTROSURGICAL) ×2 IMPLANT
GLOVE BIO SURGEON STRL SZ8 (GLOVE) ×3 IMPLANT
GOWN STRL REUS W/TWL XL LVL3 (GOWN DISPOSABLE) ×4 IMPLANT
GUIDEWIRE ANG ZIPWIRE 038X150 (WIRE) ×2 IMPLANT
GUIDEWIRE STR DUAL SENSOR (WIRE) ×1 IMPLANT
HOLDER FOLEY CATH W/STRAP (MISCELLANEOUS) ×1 IMPLANT
IV NS IRRIG 3000ML ARTHROMATIC (IV SOLUTION) ×3 IMPLANT
KIT TURNOVER CYSTO (KITS) ×3 IMPLANT
LOOP CUT BIPOLAR 24F LRG (ELECTROSURGICAL) ×1 IMPLANT
MANIFOLD NEPTUNE II (INSTRUMENTS) ×3 IMPLANT
NS IRRIG 500ML POUR BTL (IV SOLUTION) ×3 IMPLANT
PACK CYSTO (CUSTOM PROCEDURE TRAY) ×3 IMPLANT
PLUG CATH AND CAP STER (CATHETERS) IMPLANT
SYR 10ML LL (SYRINGE) ×3 IMPLANT
SYR 30ML LL (SYRINGE) ×2 IMPLANT
SYRINGE IRR TOOMEY STRL 70CC (SYRINGE) IMPLANT
TUBE CONNECTING 12X1/4 (SUCTIONS) ×3 IMPLANT
TUBING UROLOGY SET (TUBING) ×3 IMPLANT

## 2018-04-27 NOTE — Op Note (Signed)
.  Preoperative diagnosis: bladder tumor  Postoperative diagnosis: Same  Procedure: 1 cystoscopy 2. right retrograde pyelography 3.  Intraoperative fluoroscopy, under one hour, with interpretation 4. Transurethral resection of bladder tumor,medium  Attending: Rosie Fate  Anesthesia: General  Estimated blood loss: Minimal  Drains: 20 French foley  Specimens: bladder tumor   Antibiotics: ancef  Findings:  3cm papillary left lateral wall tumor involving ureteral orifice.  Multiple papillary 0.5cm bladder tumors involving the dome and posterior wall..  Ureteral orifices in normal anatomic location. No hydronephrosis or filling defects in right collecting system. We are unable to locate the left ureteral orifice.   Indications: Patient is a 78 year old female with a history of bladder tumor and gross hematuria.  After discussing treatment options, they decided proceed with transurethral resection of a bladder tumor.  Procedure her in detail: The patient was brought to the operating room and a brief timeout was done to ensure correct patient, correct procedure, correct site.  General anesthesia was administered patient was placed in dorsal lithotomy position.  Their genitalia was then prepped and draped in usual sterile fashion.  A rigid 70 French cystoscope was passed in the urethra and the bladder.  Bladder was inspected and we noted a 3cm bladder tumor on the left lateral wall and multiple small tumors on the posterior wall and dome.  the ureteral orifices were in the normal orthotopic locations.  a 6 french ureteral catheter was then instilled into the right ureteral orifice.  a gentle retrograde was obtained and findings noted above. We were unable to locate the left ureteral orifice.  We then removed the cystoscope and placed a resectoscope into the bladder. We proceeded to remove the large clot burden from the bladder. Once this was complete we turned our attention to the bladder  tumor. Using the bipolar resectoscope we removed the bladder tumor down to the base. A subsequent muscle deep biopsy was then taken. Hemostasis was then obtained with electrocautery. We then removed the bladder tumor chips and sent them for pathology. We then re-inspected the bladder and found no residual bleeding.  the bladder was then drained, a 22 French foley was placed and this concluded the procedure which was well tolerated by patient.  Complications: None  Condition: Stable, extubated, transferred to PACU  Plan: Patient is admitted overnight and will be scheduled for left nephrostomy tube placement.  If their urine is clear tomorrow they will be discharged home and followup in 5 days for foley catheter removal and pathology discussion.

## 2018-04-27 NOTE — Transfer of Care (Signed)
Immediate Anesthesia Transfer of Care Note  Patient: [name]  Procedure(s) Performed: Procedure(s) (LRB): TRANSURETHRAL RESECTION OF BLADDER TUMOR (TURBT) (N/A) CYSTOSCOPY WITH RETROGRADE PYELOGRAM (Bilateral)  Patient Location: PACU  Anesthesia Type: General  Level of Consciousness: awake, sedated, patient cooperative and responds to stimulation  Airway & Oxygen Therapy: Patient Spontanous Breathing and Patient connected to [place] oxygen  Post-op Assessment: Report given to PACU RN, Post -op Vital signs reviewed and stable and Patient moving all extremities  Post vital signs: Reviewed and stable  Complications: No apparent anesthesia complications

## 2018-04-27 NOTE — Progress Notes (Signed)
Pt has a small scratch to left hand from cat a few days ago and a leg wound on both legs mid lower leg due to dog claw marks. Area to lower legs slightly reddened around wounds.

## 2018-04-27 NOTE — Anesthesia Preprocedure Evaluation (Addendum)
Anesthesia Evaluation  Patient identified by MRN, date of birth, ID band Patient awake    Reviewed: Allergy & Precautions, NPO status , Patient's Chart, lab work & pertinent test results  History of Anesthesia Complications Negative for: history of anesthetic complications  Airway Mallampati: II  TM Distance: >3 FB Neck ROM: Full    Dental  (+) Upper Dentures   Pulmonary neg pulmonary ROS, former smoker,    Pulmonary exam normal        Cardiovascular hypertension, negative cardio ROS Normal cardiovascular exam     Neuro/Psych CVA negative psych ROS   GI/Hepatic negative GI ROS, Neg liver ROS,   Endo/Other  diabetes  Renal/GU Renal Insufficiency and CRFRenal disease  negative genitourinary   Musculoskeletal negative musculoskeletal ROS (+)   Abdominal   Peds  Hematology negative hematology ROS (+)   Anesthesia Other Findings intracranial aneurysm s/p coiling, CVA, HTN, DM, CKDIII  Reproductive/Obstetrics                            Anesthesia Physical Anesthesia Plan  ASA: III  Anesthesia Plan: General   Post-op Pain Management:    Induction: Intravenous  PONV Risk Score and Plan: 3 and Ondansetron, Dexamethasone and Treatment may vary due to age or medical condition  Airway Management Planned: LMA  Additional Equipment: None  Intra-op Plan:   Post-operative Plan: Extubation in OR  Informed Consent: I have reviewed the patients History and Physical, chart, labs and discussed the procedure including the risks, benefits and alternatives for the proposed anesthesia with the patient or authorized representative who has indicated his/her understanding and acceptance.     Dental advisory given  Plan Discussed with:   Anesthesia Plan Comments:        Anesthesia Quick Evaluation

## 2018-04-27 NOTE — Consult Note (Signed)
Chief Complaint: Patient was seen in consultation today for    Referring Physician(s): [place]  Supervising Physician: Arne Cleveland  Patient Status: [facility] - In-pt  History of Present Illness: Briana Ochoa is a 78 y.o. female , ex-smoker, with history of hematuria/recurrent bladder cancer, status post cystoscopy with transurethral resection of bladder tumor earlier today.  She has a 3 cm papillary left lateral wall tumor involving the ureteral orifice with multiple papillary 0.5 cm bladder tumors involving the dome and posterior wall.  Urology was unable to locate the left ureteral orifice.  Follow-up renal CT today revealed left-sided hydronephrosis and hydroureter.  Request now received for left percutaneous nephrostomy.  Latest creatinine 1.54.  Additional past medical history as listed below.  Past Medical History:  Diagnosis Date  . Aortic atherosclerosis ([place])   . Bladder cancer ([place])   . Bladder tumor   . Bloody diarrhea 09/29/2017  . CKD (chronic kidney disease), stage III ([place])   . Diabetes mellitus type 2, diet-controlled ([place])   . Dyspnea 09/28/2017  . Dysuria   . Fatigue 09/28/2017  . Frequency of urination   . Grade I diastolic dysfunction 48/54/6270   Noted on ECHO  . Gross hematuria 09/29/2017  . Heart murmur    since rheumatic fever as child  . Hematuria   . History of cardiomegaly 02/12/2005   Mild, noted on CXR  . History of CVA (cerebrovascular accident) 08/05/2014   post op cerebral angiogram, cerebral thrombosis w/ cerebral infarction;  11-02-2017  per pt no residuals  . History of malignant melanoma of skin 12/2015   s/p wide local excision right lower leg (per pt localized)  . History of rheumatic fever as a child   . Hypertension   . Intracranial aneurysm dx 07/ 2015   paraophthalmic RICA aneurysm /   s/p  Pipeline embolization right ICA 01-09-2014  . Jaundice 09/28/2017  . Malignant neoplasm of central portion of left breast in  female, estrogen receptor positive [facility]) oncologist-  dr Lindi Adie   dx 07/ 2015--- DCIS, ER/PR positive-- s/p  left breast lumpectomy 09-19-2013,  completed radiation 11-12-2013,  started arimidex 11-12-2013  . OA (osteoarthritis)    knees, hands,  shoulder  . Osteopenia 10/16/2012   Diffuse  . Personal history of radiation therapy   . 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 Sigmoid diverticulosis   . Weakness 09/28/2017  . Wears dentures    upper  . Wears glasses   . Wears hearing aid in both ears     Past Surgical History:  Procedure Laterality Date  . BREAST LUMPECTOMY Left 2015  . BREAST LUMPECTOMY WITH NEEDLE LOCALIZATION Left 09/19/2013   Procedure: LEFT BREAST LUMPECTOMY WITH DOUBLE WIRE BRACKETED  NEEDLE LOCALIZATION;  Surgeon: [name], MD;  Location: [place];  Service: General;  Laterality: Left;  . CATARACT EXTRACTION W/ INTRAOCULAR LENS  IMPLANT, BILATERAL  06/2015  . [place]  . CHOLECYSTECTOMY OPEN  1970s  . CYSTOSCOPY W/ URETERAL STENT PLACEMENT Left 11/07/2017   Procedure: CYSTOSCOPY WITH RETROGRADE PYELOGRAM/URETERAL STENT PLACEMENT;  Surgeon: Cleon Gustin, MD;  Location[facility];  Service: Urology;  Laterality: Left;  . FINGER SURGERY     lt thumb  from dog bite  . MELANOMA EXCISION Right 12/28/2015   Procedure: EXCISION MELANOMA RIGHT LOWER LEG;  Surgeon: Fanny Skates, MD;  Location: [place];  Service: General;  Laterality: Right;  . RADIOLOGY WITH  ANESTHESIA N/A 01/09/2014   Procedure: Embolization;  Surgeon: Consuella Lose, MD;  Location: [place];  Service: Radiology;  Laterality: N/A;  . SPLENECTOMY, TOTAL  2009 approx.   splen injury due to fall off horse  . TONSILLECTOMY  child  . TRANSURETHRAL RESECTION OF BLADDER TUMOR N/A 11/07/2017   Procedure: TRANSURETHRAL RESECTION OF BLADDER TUMOR (TURBT), POSSIBLE STENT PLACEMENT;  Surgeon: Cleon Gustin, MD;  Location: [facility];  Service: Urology;  Laterality: N/A;  . TRANSURETHRAL RESECTION OF BLADDER TUMOR N/A 12/14/2017   Procedure: TRANSURETHRAL RESECTION OF BLADDER TUMOR (TURBT);  Surgeon: Cleon Gustin, MD;  Location: [facility];  Service: Urology;  Laterality: N/A;  30 MINS    Allergies: Patient has no known allergies.  Medications: Prior to Admission medications   Medication Sig Start Date End Date Taking? Authorizing Provider  acetaminophen (TYLENOL) 325 MG tablet Take 1,000 mg by mouth every morning.    Yes [provider]  anastrozole (ARIMIDEX) 1 MG tablet Take 1 tablet (1 mg total) by mouth every morning. 02/26/18  Yes Nicholas Lose, MD  aspirin EC 81 MG tablet Take 81 mg by mouth every morning.    Yes [provider]  glucose blood (TRUE METRIX BLOOD GLUCOSE TEST) test strip Use as instructed to test blood sugar once daily E08.638 12/02/15  Yes Lucille Passy, MD  lisinopril (PRINIVIL,ZESTRIL) 20 MG tablet TAKE 1 TABLET EVERY DAY Patient taking differently: Take 20 mg by mouth every morning.  04/19/18  Yes Lucille Passy, MD  pravastatin (PRAVACHOL) 20 MG tablet TAKE 1 TABLET EVERY DAY Patient taking differently: Take 20 mg by mouth every morning.  04/19/18  Yes Lucille Passy, MD     Family History  Problem Relation Age of Onset  . Cancer Mother        kidney  . Hypertension Mother   . Heart attack Paternal Grandfather   . Breast cancer Maternal Aunt 80  . Breast cancer Paternal Aunt 52    Social History   Socioeconomic History  . Marital status: Widowed    Spouse name: Not on file  . Number of children: Not on file  . Years of education: Not on file  . Highest education level: Not on file  Occupational History    Comment: Retired  Scientific laboratory technician  . Financial resource strain: Not on file  . Food insecurity:    Worry: Patient refused    Inability: Patient refused  . Transportation needs:    Medical: Patient refused    Non-medical:  Patient refused  Tobacco Use  . Smoking status: Former Smoker    Packs/day: 1.50    Years: 40.00    Pack years: 60.00    Types: Cigarettes    Last attempt to quit: 03/06/2007    Years since quitting: 11.1  . Smokeless tobacco: Never Used  Substance and Sexual Activity  . Alcohol use: No  . Drug use: Never  . Sexual activity: Not on file  Lifestyle  . Physical activity:    Days per week: Patient refused    Minutes per session: Patient refused  . Stress: Not at all  Relationships  . Social connections:    Talks on phone: Patient refused    Gets together: Patient refused    Attends religious service: Patient refused    Active member of club or organization: Patient refused    Attends meetings of clubs or organizations: Patient refused    Relationship status:  Patient refused  Other Topics Concern  . Not on file  Social History Narrative   Widowed 2013   5 children   17 grandchildren      Does not have a living will-desires CPR, does not want prolonged life support if futile.    .  Review of Systems currently denies fever, headache, chest pain, dyspnea, cough, abdominal/back pain, nausea, vomiting. She does have a small amount of blood-tinged urine in Foley catheter  Vital Signs: BP (!) 145/85 (BP Location: Left Arm)   Pulse 70   Temp (!) 97.5 F (36.4 C) (Oral)   Resp 18   Ht 5\' 4"  (1.626 m)   Wt 194 lb 10.7 oz (88.3 kg)   SpO2 95%   BMI 33.41 kg/m   Physical Exam awake, alert.  Chest with clear but distant breath sounds bilaterally.  Heart with regular rate and rhythm.  Abdomen soft, positive bowel sounds, nontender.  No lower extremity edema.  Imaging: Ct Renal Stone Study  Result Date: 04/27/2018 CLINICAL DATA:  History of bladder cancer with reported recurrence at cystoscopy in the office. TURBT today. EXAM: CT ABDOMEN AND PELVIS WITHOUT CONTRAST TECHNIQUE: Multidetector CT imaging of the abdomen and pelvis was performed following the standard protocol without  IV contrast. COMPARISON:  CT 09/29/2017. FINDINGS: Lower chest: Mild subsegmental atelectasis at both lung bases superimposed on underlying emphysema. Small subpleural nodules at the right lung base on images 11 and 17 of series 3 are unchanged. Coronary artery atherosclerosis and a small hiatal hernia are noted. Hepatobiliary: No focal hepatic abnormalities are identified on this noncontrast study. No significant biliary dilatation post cholecystectomy. Pancreas: Unremarkable. No pancreatic ductal dilatation or surrounding inflammatory changes. Spleen: Previous splenectomy. Small splenules/regenerated splenic tissue adjacent to the pancreatic tail unchanged. Adrenals/Urinary Tract: Both adrenal glands appear normal. Mild renal cortical thinning bilaterally. In the lower pole of the left kidney, there is a 5 mm hyperdense lesion on image 34/2, not well seen on previous contrast enhanced study. There is moderate left-sided hydronephrosis and hydroureter. The left ureter is dilated to the ureterovesical junction. No evidence of urinary tract calculus. Foley catheter in place without obvious residual bladder abnormality. Stomach/Bowel: No evidence of bowel wall thickening, distention or surrounding inflammatory change. Diverticular changes throughout the descending and sigmoid colon. Vascular/Lymphatic: Numerous small mesenteric lymph nodes are similar to previous study. No retroperitoneal or pelvic adenopathy identified. Aortic and branch vessel atherosclerosis without acute vascular findings on noncontrast imaging. Reproductive: The uterus and ovaries appear normal. Other: No extraluminal fluid or air collections. Musculoskeletal: No acute or significant osseous findings. Multilevel lumbar spondylosis with asymmetric right hip arthropathy. IMPRESSION: 1. Left-sided hydronephrosis and hydroureter status post TURBT earlier today. No clear etiology identified. This could be secondary to distal ureteral tumor or clot.  The bladder is decompressed without obvious residual lesion on noncontrast imaging. 2. The right kidney and ureter demonstrate no significant findings. 3. No other significant findings or changes. 4.  Aortic Atherosclerosis (ICD10-I70.0). Electronically Signed   By: Richardean Sale M.D.   On: 04/27/2018 17:12    Labs:  CBC: Recent Labs    09/28/17 1227 11/07/17 0613 12/14/17 1110  WBC 12.1*  --   --   HGB 11.3* 12.2 13.3  HCT 34.1* 36.0 39.0  PLT 374.0  --   --     COAGS: No results for input(s): INR, APTT in the last 8760 hours.  BMP: Recent Labs    09/28/17 1227 11/07/17 0613 12/14/17 1110 04/27/18 1030  NA 140 141 141 141  K 4.7 4.1 4.6 5.2*  CL 105  --  108 107  CO2 27  --   --  26  GLUCOSE 118* 152* 141* 144*  BUN 31*  --  26* 22  CALCIUM 9.7  --   --  9.3  CREATININE 1.54*  --  1.50* 1.54*  GFRNONAA  --   --   --  32*  GFRAA  --   --   --  37*    LIVER FUNCTION TESTS: Recent Labs    09/28/17 1227  BILITOT 0.3  AST 11  ALT 8  ALKPHOS 81  PROT 6.4  ALBUMIN 3.9    TUMOR MARKERS: No results for input(s): AFPTM, CEA, CA199, CHROMGRNA in the last 8760 hours.  Assessment and Plan: 78 y.o. female , ex-smoker, with history of hematuria/recurrent bladder cancer, status post cystoscopy with transurethral resection of bladder tumor earlier today.  Path pend. She has a 3 cm papillary left lateral wall tumor involving the ureteral orifice with multiple papillary 0.5 cm bladder tumors involving the dome and posterior wall.  Urology was unable to locate the left ureteral orifice.  Follow-up renal CT today revealed left-sided hydronephrosis and hydroureter.  Request now received for left percutaneous nephrostomy.  Latest creatinine 1.54.  Imaging studies have been reviewed.Risks and benefits of procedure were discussed with the patient/daughter including, but not limited to, infection, bleeding, significant bleeding causing loss or decrease in renal function or damage to  adjacent structures.   All of the patient's questions were answered, patient is agreeable to proceed.  Consent signed and in chart.  Procedure tentatively scheduled for 3/21    Thank you for this interesting consult.  I greatly enjoyed meeting Kitana Gage and look forward to participating in their care.  A copy of this report was sent to the requesting provider on this date.  Electronically Signed: D. Rowe Robert, PA-C 04/27/2018, 5:27 PM   I spent a total of 30 minutes  in face to face in clinical consultation, greater than 50% of which was counseling/coordinating care for left percutaneous nephrostomy

## 2018-04-27 NOTE — Anesthesia Postprocedure Evaluation (Signed)
Anesthesia Post Note  Patient: Annelise Mccoy Threat  Procedure(s) Performed: TRANSURETHRAL RESECTION OF BLADDER TUMOR (TURBT) (N/A ) CYSTOSCOPY WITH RETROGRADE PYELOGRAM (Bilateral )     Patient location during evaluation: PACU Anesthesia Type: General Level of consciousness: awake and alert Pain management: pain level controlled Vital Signs Assessment: post-procedure vital signs reviewed and stable Respiratory status: spontaneous breathing, nonlabored ventilation, respiratory function stable and patient connected to nasal cannula oxygen Cardiovascular status: blood pressure returned to baseline and stable Postop Assessment: no apparent nausea or vomiting Anesthetic complications: no    Last Vitals:  Vitals:   04/27/18 1315 04/27/18 1330  BP: 131/80 137/67  Pulse: 66 64  Resp: 11 11  Temp:    SpO2: 97% 97%    Last Pain:  Vitals:   04/27/18 1330  TempSrc:   PainSc: 0-No pain                 Lidia Collum

## 2018-04-27 NOTE — H&P (Signed)
Urology Admission H&P  Chief Complaint: bladder cancer  History of Present Illness: Briana Ochoa is a 78yo with a hx of high grade bladder cancer who was found to have a recurrance on office cystoscopy. No hematuria or dysuria. No new LUTS  Past Medical History:  Diagnosis Date  . Aortic atherosclerosis ([place])   . Bladder cancer ([place])   . Bladder tumor   . Bloody diarrhea 09/29/2017  . CKD (chronic kidney disease), stage III ([place])   . Diabetes mellitus type 2, diet-controlled ([place])   . Dyspnea 09/28/2017  . Dysuria   . Fatigue 09/28/2017  . Frequency of urination   . Grade I diastolic dysfunction 51/88/4166   Noted on ECHO  . Gross hematuria 09/29/2017  . Heart murmur    since rheumatic fever as child  . Hematuria   . History of cardiomegaly 02/12/2005   Mild, noted on CXR  . History of CVA (cerebrovascular accident) 08/05/2014   post op cerebral angiogram, cerebral thrombosis w/ cerebral infarction;  11-02-2017  per pt no residuals  . History of malignant melanoma of skin 12/2015   s/p wide local excision right lower leg (per pt localized)  . History of rheumatic fever as a child   . Hypertension   . Intracranial aneurysm dx 07/ 2015   paraophthalmic RICA aneurysm /   s/p  Pipeline embolization right ICA 01-09-2014  . Jaundice 09/28/2017  . Malignant neoplasm of central portion of left breast in female, estrogen receptor positive [facility]) oncologist-  dr Lindi Adie   dx 07/ 2015--- DCIS, ER/PR positive-- s/p  left breast lumpectomy 09-19-2013,  completed radiation 11-12-2013,  started arimidex 11-12-2013  . OA (osteoarthritis)    knees, hands,  shoulder  . Osteopenia 10/16/2012   Diffuse  . Personal history of radiation therapy   . 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 Sigmoid diverticulosis   . Weakness 09/28/2017  . Wears dentures    upper  . Wears glasses   . Wears hearing aid in both ears    Past Surgical History:  Procedure Laterality Date  . BREAST  LUMPECTOMY Left 2015  . BREAST LUMPECTOMY WITH NEEDLE LOCALIZATION Left 09/19/2013   Procedure: LEFT BREAST LUMPECTOMY WITH DOUBLE WIRE BRACKETED  NEEDLE LOCALIZATION;  Surgeon: [name], MD;  Location: [place];  Service: General;  Laterality: Left;  . CATARACT EXTRACTION W/ INTRAOCULAR LENS  IMPLANT, BILATERAL  06/2015  . [place]  . CHOLECYSTECTOMY OPEN  1970s  . CYSTOSCOPY W/ URETERAL STENT PLACEMENT Left 11/07/2017   Procedure: CYSTOSCOPY WITH RETROGRADE PYELOGRAM/URETERAL STENT PLACEMENT;  Surgeon: Cleon Gustin, MD;  Location: [facility];  Service: Urology;  Laterality: Left;  . FINGER SURGERY     lt thumb  from dog bite  . MELANOMA EXCISION Right 12/28/2015   Procedure: EXCISION MELANOMA RIGHT LOWER LEG;  Surgeon: Fanny Skates, MD;  Location: [place];  Service: General;  Laterality: Right;  . RADIOLOGY WITH ANESTHESIA N/A 01/09/2014   Procedure: Embolization;  Surgeon: Consuella Lose, MD;  Location: [place];  Service: Radiology;  Laterality: N/A;  . SPLENECTOMY, TOTAL  2009 approx.   splen injury due to fall off horse  . TONSILLECTOMY  child  . TRANSURETHRAL RESECTION OF BLADDER TUMOR N/A 11/07/2017   Procedure: TRANSURETHRAL RESECTION OF BLADDER TUMOR (TURBT), POSSIBLE STENT PLACEMENT;  Surgeon: Cleon Gustin, MD;  Location[facility];  Service: Urology;  Laterality: N/A;  . TRANSURETHRAL  RESECTION OF BLADDER TUMOR N/A 12/14/2017   Procedure: TRANSURETHRAL RESECTION OF BLADDER TUMOR (TURBT);  Surgeon: Cleon Gustin, MD;  [facility];  Service: Urology;  Laterality: N/A;  30 MINS    Home Medications:  Current Facility-Administered Medications  Medication Dose Route Frequency Provider Last Rate Last Dose  . 0.9 %  sodium chloride infusion   Intravenous Continuous Briana Ochoa, Adam, MD 50 mL/hr at 04/27/18 1053 50 mL/hr at 04/27/18 1053  . ceFAZolin (ANCEF) IVPB 2g/100 mL  premix  2 g Intravenous 30 min Pre-Op Briana Ochoa, Briana Furbish, MD       Allergies: No Known Allergies  Family History  Problem Relation Age of Onset  . Cancer Mother        kidney  . Hypertension Mother   . Heart attack Paternal Grandfather   . Breast cancer Maternal Aunt 80  . Breast cancer Paternal Aunt 23   Social History:  reports that she quit smoking about 11 years ago. Her smoking use included cigarettes. She has a 60.00 pack-year smoking history. She has never used smokeless tobacco. She reports that she does not drink alcohol or use drugs.  Review of Systems  All other systems reviewed and are negative.   Physical Exam:  Vital signs in last 24 hours: Temp:  [97.7 F (36.5 C)] 97.7 F (36.5 C) (03/20 1002) Pulse Rate:  [72] 72 (03/20 1002) Resp:  [16] 16 (03/20 1002) BP: (169)/(83) 169/83 (03/20 1002) SpO2:  [97 %] 97 % (03/20 1002) Weight:  [87.8 kg] 87.8 kg (03/20 1002) Physical Exam  Constitutional: She is oriented to person, place, and time. She appears well-developed and well-nourished.  HENT:  Head: Normocephalic and atraumatic.  Eyes: Pupils are equal, round, and reactive to light. EOM are normal.  Neck: Normal range of motion. No thyromegaly present.  Cardiovascular: Normal rate and regular rhythm.  Respiratory: Effort normal. No respiratory distress.  GI: Soft. She exhibits no distension.  Musculoskeletal: Normal range of motion.        General: No edema.  Neurological: She is alert and oriented to person, place, and time.  Skin: Skin is warm and dry.  Psychiatric: She has a normal mood and affect. Her behavior is normal. Judgment and thought content normal.    Laboratory Data:  Results for orders placed or performed during the hospital encounter of 04/27/18 (from the past 24 hour(s))  Basic metabolic panel     Status: Abnormal   Collection Time: 04/27/18 10:30 AM  Result Value Ref Range   Sodium 141 135 - 145 mmol/L   Potassium 5.2 (H) 3.5 - 5.1 mmol/L    Chloride 107 98 - 111 mmol/L   CO2 26 22 - 32 mmol/L   Glucose, Bld 144 (H) 70 - 99 mg/dL   BUN 22 8 - 23 mg/dL   Creatinine, Ser 1.54 (H) 0.44 - 1.00 mg/dL   Calcium 9.3 8.9 - 10.3 mg/dL   GFR calc non Af Amer 32 (L) >60 mL/min   GFR calc Af Amer 37 (L) >60 mL/min   Anion gap 8 5 - 15   No results found for this or any previous visit (from the past 240 hour(s)). Creatinine: Recent Labs    04/27/18 1030  CREATININE 1.54*   Baseline Creatinine: 1.5  Impression/Assessment:  77yo with recurrent bladder cancer  Plan:  The risks/benefits/alternatives to bladder tumor resection was explained to the patient and she understands and wishes to proceed with surgery  Nicolette Bang 04/27/2018,  11:59 AM

## 2018-04-27 NOTE — Anesthesia Procedure Notes (Signed)
Procedure Name: LMA Insertion Date/Time: 04/27/2018 12:15 PM Performed by: Justice Rocher, CRNA Pre-anesthesia Checklist: Patient identified, Emergency Drugs available, Suction available and Patient being monitored Patient Re-evaluated:Patient Re-evaluated prior to induction Oxygen Delivery Method: Circle system utilized Preoxygenation: Pre-oxygenation with 100% oxygen Induction Type: IV induction Ventilation: Mask ventilation without difficulty LMA: LMA inserted LMA Size: 4.0 Number of attempts: 1 Airway Equipment and Method: Bite block Placement Confirmation: positive ETCO2 and breath sounds checked- equal and bilateral Tube secured with: Tape Dental Injury: Teeth and Oropharynx as per pre-operative assessment

## 2018-04-28 ENCOUNTER — Encounter (HOSPITAL_COMMUNITY): Payer: Self-pay | Admitting: Diagnostic Radiology

## 2018-04-28 ENCOUNTER — Observation Stay (HOSPITAL_COMMUNITY): Payer: Medicare PPO

## 2018-04-28 DIAGNOSIS — N183 Chronic kidney disease, stage 3 (moderate): Secondary | ICD-10-CM | POA: Diagnosis not present

## 2018-04-28 DIAGNOSIS — I5030 Unspecified diastolic (congestive) heart failure: Secondary | ICD-10-CM | POA: Diagnosis not present

## 2018-04-28 DIAGNOSIS — E1122 Type 2 diabetes mellitus with diabetic chronic kidney disease: Secondary | ICD-10-CM | POA: Diagnosis not present

## 2018-04-28 DIAGNOSIS — Z8673 Personal history of transient ischemic attack (TIA), and cerebral infarction without residual deficits: Secondary | ICD-10-CM | POA: Diagnosis not present

## 2018-04-28 DIAGNOSIS — I110 Hypertensive heart disease with heart failure: Secondary | ICD-10-CM | POA: Diagnosis not present

## 2018-04-28 DIAGNOSIS — R110 Nausea: Secondary | ICD-10-CM | POA: Diagnosis not present

## 2018-04-28 DIAGNOSIS — C678 Malignant neoplasm of overlapping sites of bladder: Secondary | ICD-10-CM | POA: Diagnosis not present

## 2018-04-28 DIAGNOSIS — N1330 Unspecified hydronephrosis: Secondary | ICD-10-CM | POA: Diagnosis not present

## 2018-04-28 DIAGNOSIS — Z8551 Personal history of malignant neoplasm of bladder: Secondary | ICD-10-CM | POA: Diagnosis not present

## 2018-04-28 DIAGNOSIS — D494 Neoplasm of unspecified behavior of bladder: Secondary | ICD-10-CM | POA: Diagnosis not present

## 2018-04-28 DIAGNOSIS — M1389 Other specified arthritis, multiple sites: Secondary | ICD-10-CM | POA: Diagnosis not present

## 2018-04-28 DIAGNOSIS — I130 Hypertensive heart and chronic kidney disease with heart failure and stage 1 through stage 4 chronic kidney disease, or unspecified chronic kidney disease: Secondary | ICD-10-CM | POA: Diagnosis not present

## 2018-04-28 DIAGNOSIS — R310 Gross hematuria: Secondary | ICD-10-CM | POA: Diagnosis not present

## 2018-04-28 HISTORY — PX: IR  NEPHROURETERAL CATH PLACE LEFT: IMG6065

## 2018-04-28 LAB — BASIC METABOLIC PANEL
Anion gap: 6 (ref 5–15)
BUN: 23 mg/dL (ref 8–23)
CO2: 25 mmol/L (ref 22–32)
Calcium: 9 mg/dL (ref 8.9–10.3)
Chloride: 108 mmol/L (ref 98–111)
Creatinine, Ser: 1.4 mg/dL — ABNORMAL HIGH (ref 0.44–1.00)
GFR calc Af Amer: 42 mL/min — ABNORMAL LOW (ref >60)
GFR calc non Af Amer: 36 mL/min — ABNORMAL LOW (ref >60)
Glucose, Bld: 139 mg/dL — ABNORMAL HIGH (ref 70–99)
Potassium: 4.7 mmol/L (ref 3.5–5.1)
Sodium: 139 mmol/L (ref 135–145)

## 2018-04-28 LAB — CBC WITH DIFFERENTIAL/PLATELET
Abs Immature Granulocytes: 0.05 10*3/uL (ref 0.00–0.07)
Basophils Absolute: 0 10*3/uL (ref 0.0–0.1)
Basophils Relative: 0 %
Eosinophils Absolute: 0 10*3/uL (ref 0.0–0.5)
Eosinophils Relative: 0 %
HCT: 41.5 % (ref 36.0–46.0)
Hemoglobin: 12.7 g/dL (ref 12.0–15.0)
Immature Granulocytes: 1 %
Lymphocytes Relative: 10 %
Lymphs Abs: 1 10*3/uL (ref 0.7–4.0)
MCH: 27.9 pg (ref 26.0–34.0)
MCHC: 30.6 g/dL (ref 30.0–36.0)
MCV: 91.2 fL (ref 80.0–100.0)
Monocytes Absolute: 0.5 10*3/uL (ref 0.1–1.0)
Monocytes Relative: 5 %
Neutro Abs: 8.1 10*3/uL — ABNORMAL HIGH (ref 1.7–7.7)
Neutrophils Relative %: 84 %
Platelets: 371 10*3/uL (ref 150–400)
RBC: 4.55 MIL/uL (ref 3.87–5.11)
RDW: 15.7 % — ABNORMAL HIGH (ref 11.5–15.5)
WBC: 9.7 10*3/uL (ref 4.0–10.5)
nRBC: 0 % (ref 0.0–0.2)

## 2018-04-28 LAB — PROTIME-INR
INR: 1 (ref 0.8–1.2)
Prothrombin Time: 13.3 seconds (ref 11.4–15.2)

## 2018-04-28 MED ORDER — LIDOCAINE HCL 1 % IJ SOLN
INTRAMUSCULAR | Status: AC
  Filled 2018-04-28: qty 20

## 2018-04-28 MED ORDER — IOHEXOL 300 MG/ML  SOLN: 20.0000 mL | Freq: Once | INTRAMUSCULAR | Status: DC | PRN

## 2018-04-28 MED ORDER — LIDOCAINE HCL 1 % IJ SOLN
INTRAMUSCULAR | Status: AC | PRN
  Administered 2018-04-28: 20 mL

## 2018-04-28 MED ORDER — FENTANYL CITRATE (PF) 100 MCG/2ML IJ SOLN
INTRAMUSCULAR | Status: AC | PRN
  Administered 2018-04-28 (×2): 50 ug via INTRAVENOUS
  Administered 2018-04-28 (×2): 25 ug via INTRAVENOUS

## 2018-04-28 MED ORDER — MIDAZOLAM HCL 2 MG/2ML IJ SOLN
INTRAMUSCULAR | Status: AC
  Filled 2018-04-28: qty 6

## 2018-04-28 MED ORDER — FENTANYL CITRATE (PF) 100 MCG/2ML IJ SOLN
INTRAMUSCULAR | Status: AC
  Filled 2018-04-28: qty 4

## 2018-04-28 MED ORDER — MIDAZOLAM HCL 2 MG/2ML IJ SOLN
INTRAMUSCULAR | Status: AC | PRN
  Administered 2018-04-28: 1 mg via INTRAVENOUS
  Administered 2018-04-28: 0.5 mg via INTRAVENOUS
  Administered 2018-04-28 (×2): 1 mg via INTRAVENOUS

## 2018-04-28 NOTE — Care Management Obs Status (Signed)
[facility] NOTIFICATION   Patient Details  Name: Briana Ochoa MRN: [id number] Date of Birth: Jun 06, 1940   Medicare Observation Status Notification Given:  Yes(daughter Udzinski,Rhonda signed document, paper copy left in chart)    Erenest Rasher, RN 04/28/2018, 4:43 PM

## 2018-04-28 NOTE — Sedation Documentation (Signed)
Room air sat 89%. Pt paced on  2LPM. Sats 97%.

## 2018-04-28 NOTE — Sedation Documentation (Signed)
Dr. Junious Silk paged.

## 2018-04-28 NOTE — Procedures (Signed)
Interventional Radiology Procedure:   Indications: Bladder tumor and unable to identify left ureter orifice with cystoscopy.  Needs nephroureteral catheter placement  Procedure: Placement of left nephroureteral catheter  Findings: No significant hydronephrosis at beginning of procedure.  Successful cannulation of left renal collecting system.  Fullness of left ureter.  Successful placement of left nephroureteral catheter, tip in bladder.  Kidney access via lower pole calyx.    Complications: None     EBL: Minimal, less than 10 ml  Plan: Keep drain to gravity bag for now.  Drain can be capped in future.  Outpatient plan per urology.     Christyn Gutkowski R. Anselm Pancoast, MD  Pager: [phone]

## 2018-04-28 NOTE — Discharge Summary (Signed)
Physician Discharge Summary  Patient ID: Briana Ochoa MRN: [id number] DOB/AGE: 1940/08/03 78 y.o.  Admit date: 04/27/2018 Discharge date: 04/28/2018  Admission Diagnoses: History of bladder cancer, bladder neoplasm  Discharge Diagnoses:  Active Problems:   Bladder cancer [facility])   Discharged Condition: good  Hospital Course: The patient was admitted following TURBT.  The left ureteral orifice was resected.  She went postop day 1 for a left nephroureteral stent. Following these procedures she felt a little bit dizzy and a little nauseated.  She was kept overnight for observation.  On postop day 2, she was doing well, tolerating a diet and ambulating.  Ready for discharge.  Consults: Interventional radiology  Significant Diagnostic Studies: CT scan  Treatments: #1 TURBT, #2 left nephroureteral stent  Discharge Exam: Blood pressure 126/61, pulse 61, temperature 98.1 F (36.7 C), temperature source Oral, resp. rate 16, height 5\' 4"  (1.626 m), weight 88.3 kg, SpO2 92 %. Sitting in a chair watching TV, looks bright eyed Alert and oriented in no acute distress Left nephrostomy tube with light rose urine, clear in the tubing Foley catheter in place with light rose urine in the bag.  No clots.  Disposition:  To home.   Allergies as of 04/28/2018   No Known Allergies     Medication List    TAKE these medications   acetaminophen 325 MG tablet Commonly known as:  TYLENOL Take 1,000 mg by mouth every morning.   anastrozole 1 MG tablet Commonly known as:  ARIMIDEX Take 1 tablet (1 mg total) by mouth every morning.   aspirin EC 81 MG tablet Take 81 mg by mouth every morning.   glucose blood test strip Commonly known as:  True Metrix Blood Glucose Test Use as instructed to test blood sugar once daily E08.638   lisinopril 20 MG tablet Commonly known as:  PRINIVIL,ZESTRIL TAKE 1 TABLET EVERY DAY What changed:  when to take this   pravastatin 20 MG tablet Commonly  known as:  PRAVACHOL TAKE 1 TABLET EVERY DAY What changed:  when to take this      Follow-up Information    McKenzie, Candee Furbish, MD Follow up in 1 week(s).   Specialty:  Urology Contact information: [address] [place] [address] [phone]           Signed: Festus Aloe 04/28/2018, 1:55 PM

## 2018-04-28 NOTE — Discharge Instructions (Signed)
Percutaneous Nephrostomy, Care After °This sheet gives you information about how to care for yourself after your procedure. Your health care provider may also give you more specific instructions. If you have problems or questions, contact your health care provider. °What can I expect after the procedure? °After the procedure, it is common to have: °· Some soreness where the nephrostomy tube was inserted (tube insertion site). °· Blood-tinged drainage from the nephrostomy tube for the first 24 hours. °Follow these instructions at home: °Activity °· Return to your normal activities as told by your health care provider. Ask your health care provider what activities are safe for you. °· Avoid activities that may cause the nephrostomy tubing to bend. °· Do not take baths, swim, or use a hot tub until your health care provider approves. Ask your health care provider if you can take showers. Cover the nephrostomy tube dressing with a watertight covering when you take a shower. °· Do not drive for 24 hours if you were given a medicine to help you relax (sedative). °Care of the tube insertion site ° °· Follow instructions from your health care provider about how to take care of your tube insertion site. Make sure you: °? Wash your hands with soap and water before you change your bandage (dressing). If soap and water are not available, use hand sanitizer. °? Change your dressing as told by your health care provider. Be careful not to pull on the tube while removing the dressing. °? When you change the dressing, wash the skin around the tube, rinse well, and pat the skin dry. °· Check the tube insertion area every day for signs of infection. Check for: °? More redness, swelling, or pain. °? More fluid or blood. °? Warmth. °? Pus or a bad smell. °Care of the nephrostomy tube and drainage bag °· Always keep the tubing, the leg bag, or the bedside drainage bags below the level of the kidney so that your urine drains  freely. °· When connecting your nephrostomy tube to a drainage bag, make sure that there are no kinks in the tubing and that your urine is draining freely. You may want to use an elastic bandage to wrap any exposed tubing that goes from the nephrostomy tube to any of the connecting tubes. °· At night, you may want to connect your nephrostomy tube or the leg bag to a larger bedside drainage bag. °· Follow instructions from your health care provider about how to empty or change the drainage bag. °· Empty the drainage bag when it becomes ? full. °· Replace the drainage bag and any extension tubing that is connected to your nephrostomy tube every 3 weeks or as often as told by your health care provider. Your health care provider will explain how to change the drainage bag and extension tubing. °General instructions °· Take over-the-counter and prescription medicines only as told by your health care provider. °· Keep all follow-up visits as told by your health care provider. This is important. °Contact a health care provider if: °· You have problems with any of the valves or tubing. °· You have persistent pain or soreness in your back. °· You have more redness, swelling, or pain around your tube insertion site. °· You have more fluid or blood coming from your tube insertion site. °· Your tube insertion site feels warm to the touch. °· You have pus or a bad smell coming from your tube insertion site. °· You have increased urine output or you feel   burning when urinating. Get help right away if:  You have pain in your abdomen during the first week.  You have chest pain or have trouble breathing.  You have a new appearance of blood in your urine.  You have a fever or chills.  You have back pain that is not relieved by your medicine.  You have decreased urine output.  Your nephrostomy tube comes out. This information is not intended to replace advice given to you by your health care provider. Make sure you  discuss any questions you have with your health care provider. Document Released: 09/17/2003 Document Revised: 11/06/2015 Document Reviewed: 11/06/2015 Elsevier Interactive Patient Education  2019 [place], Adult An indwelling urinary catheter is a thin tube that is put into your bladder. The tube helps to drain pee (urine) out of your body. The tube goes in through your urethra. Your urethra is where pee comes out of your body. Your pee will come out through the catheter, then it will go into a bag (drainage bag). Take good care of your catheter so it will work well. How to wear your catheter and bag Supplies needed  Sticky tape (adhesive tape) or a leg strap.  Alcohol wipe or soap and water (if you use tape).  A clean towel (if you use tape).  Large overnight bag.  Smaller bag (leg bag). Wearing your catheter Attach your catheter to your leg with tape or a leg strap.  Make sure the catheter is not pulled tight.  If a leg strap gets wet, take it off and put on a dry strap.  If you use tape to hold the bag on your leg: 1. Use an alcohol wipe or soap and water to wash your skin where the tape made it sticky before. 2. Use a clean towel to pat-dry that skin. 3. Use new tape to make the bag stay on your leg. Wearing your bags You should have been given a large overnight bag.  You may wear the overnight bag in the day or night.  Always have the overnight bag lower than your bladder.  Do not let the bag touch the floor.  Before you go to sleep, put a clean plastic bag in a wastebasket. Then hang the overnight bag inside the wastebasket. You should also have a smaller leg bag that fits under your clothes.  Always wear the leg bag below your knee.  Do not wear your leg bag at night. How to care for your skin and catheter Supplies needed  A clean washcloth.  Water and mild soap.  A clean towel. Caring for your skin and catheter       Clean the skin around your catheter every day: ? Wash your hands with soap and water. ? Wet a clean washcloth in warm water and mild soap. ? Clean the skin around your urethra. ? If you are female: ? Gently spread the folds of skin around your vagina (labia). ? With the washcloth in your other hand, wipe the inner side of your labia on each side. Wipe from front to back. ? If you are female: ? Pull back any skin that covers the end of your penis (foreskin). ? With the washcloth in your other hand, wipe your penis in small circles. Start wiping at the tip of your penis, then move away from the catheter. ? With your free hand, hold the catheter close to where it goes into your body. ? Keep holding  the catheter during cleaning so it does not get pulled out. ? With the washcloth in your other hand, clean the catheter. ? Only wipe downward on the catheter. ? Do not wipe upward toward your body. Doing this may push germs into your urethra and cause infection. ? Use a clean towel to pat-dry the catheter and the skin around it. Make sure to wipe off all soap. ? Wash your hands with soap and water.  Shower every day. Do not take baths.  Do not use cream, ointment, or lotion on the area where the catheter goes into your body, unless your doctor tells you to.  Do not use powders, sprays, or lotions on your genital area.  Check your skin around the catheter every day for signs of infection. Check for: ? Redness, swelling, or pain. ? Fluid or blood. ? Warmth. ? Pus or a bad smell. How to empty the bag Supplies needed  Rubbing alcohol.  Gauze pad or cotton ball.  Tape or a leg strap. Emptying the bag Pour the pee out of your bag when it is ?- full, or at least 2-3 times a day. Do this for your overnight bag and your leg bag. 1. Wash your hands with soap and water. 2. Separate (detach) the bag from your leg. 3. Hold the bag over the toilet or a clean pail. Keep the bag lower than your  hips and bladder. This is so the pee (urine) does not go back into the tube. 4. Open the pour spout. It is at the bottom of the bag. 5. Empty the pee into the toilet or pail. Do not let the pour spout touch any surface. 6. Put rubbing alcohol on a gauze pad or cotton ball. 7. Use the gauze pad or cotton ball to clean the pour spout. 8. Close the pour spout. 9. Attach the bag to your leg with tape or a leg strap. 10. Wash your hands with soap and water. Follow instructions for cleaning the drainage bag:  From the product maker.  As told by your doctor. How to change the bag Supplies needed  Alcohol wipes.  A clean bag.  Tape or a leg strap. Changing the bag Replace your bag with a clean bag once a month. If it starts to leak, smell bad, or look dirty, change it sooner. 1. Wash your hands with soap and water. 2. Separate the dirty bag from your leg. 3. Pinch the catheter with your fingers so that pee does not spill out. 4. Separate the catheter tube from the bag tube where these tubes connect (at the connection valve). Do not let the tubes touch any surface. 5. Clean the end of the catheter tube with an alcohol wipe. Use a different alcohol wipe to clean the end of the bag tube. 6. Connect the catheter tube to the tube of the clean bag. 7. Attach the clean bag to your leg with tape or a leg strap. Do not make the bag tight on your leg. 8. Wash your hands with soap and water. General rules   Never pull on your catheter. Never try to take it out. Doing that can hurt you.  Always wash your hands before and after you touch your catheter or bag. Use a mild, fragrance-free soap. If you do not have soap and water, use hand sanitizer.  Always make sure there are no twists or bends (kinks) in the catheter tube.  Always make sure there are no leaks in the  catheter or bag.  Drink enough fluid to keep your pee pale yellow.  Do not take baths, swim, or use a hot tub.  If you are  female, wipe from front to back after you poop (have a bowel movement). Contact a doctor if:  Your pee is cloudy.  Your pee smells worse than usual.  Your catheter gets clogged.  Your catheter leaks.  Your bladder feels full. Get help right away if:  You have redness, swelling, or pain where the catheter goes into your body.  You have fluid, blood, pus, or a bad smell coming from the area where the catheter goes into your body.  Your skin feels warm where the catheter goes into your body.  You have a fever.  You have pain in your: ? Belly (abdomen). ? Legs. ? Lower back. ? Bladder.  You see blood in the catheter.  Your pee is pink or red.  You feel sick to your stomach (nauseous).  You throw up (vomit).  You have chills.  Your pee is not draining into the bag.  Your catheter gets pulled out. Summary  An indwelling urinary catheter is a thin tube that is placed into the bladder to help drain pee (urine) out of the body.  The catheter is placed into the part of the body that drains pee from the bladder (urethra).  Taking good care of your catheter will keep it working properly and help prevent problems.  Always wash your hands before and after touching your catheter or bag.  Never pull on your catheter or try to take it out. This information is not intended to replace advice given to you by your health care provider. Make sure you discuss any questions you have with your health care provider. Document Released: 05/21/2012 Document Revised: 07/17/2017 Document Reviewed: 09/09/2016 Elsevier Interactive Patient Education  2019 [employer].

## 2018-04-29 DIAGNOSIS — C678 Malignant neoplasm of overlapping sites of bladder: Secondary | ICD-10-CM | POA: Diagnosis not present

## 2018-04-29 DIAGNOSIS — Z8673 Personal history of transient ischemic attack (TIA), and cerebral infarction without residual deficits: Secondary | ICD-10-CM | POA: Diagnosis not present

## 2018-04-29 DIAGNOSIS — E1122 Type 2 diabetes mellitus with diabetic chronic kidney disease: Secondary | ICD-10-CM | POA: Diagnosis not present

## 2018-04-29 DIAGNOSIS — I110 Hypertensive heart disease with heart failure: Secondary | ICD-10-CM | POA: Diagnosis not present

## 2018-04-29 DIAGNOSIS — R310 Gross hematuria: Secondary | ICD-10-CM | POA: Diagnosis not present

## 2018-04-29 DIAGNOSIS — N183 Chronic kidney disease, stage 3 (moderate): Secondary | ICD-10-CM | POA: Diagnosis not present

## 2018-04-29 DIAGNOSIS — I130 Hypertensive heart and chronic kidney disease with heart failure and stage 1 through stage 4 chronic kidney disease, or unspecified chronic kidney disease: Secondary | ICD-10-CM | POA: Diagnosis not present

## 2018-04-29 DIAGNOSIS — I5030 Unspecified diastolic (congestive) heart failure: Secondary | ICD-10-CM | POA: Diagnosis not present

## 2018-04-29 DIAGNOSIS — M1389 Other specified arthritis, multiple sites: Secondary | ICD-10-CM | POA: Diagnosis not present

## 2018-04-29 NOTE — Progress Notes (Signed)
This is a late progress note for yesterday.  The patient underwent left nephroureteral stent placement yesterday.  She was stable but felt a little bit dizzy and nauseated after fentanyl use during and after the procedure.  She was kept overnight for observation.

## 2018-04-29 NOTE — Plan of Care (Signed)
Discharge instructions reviewed with patient, questions answered, verbalized understanding.  Foley switched to leg bag prior to discharge. Care of nephrostomy discussed with patient.   Patient transported via wheelchair to main entrance of hospital to be taken home by daughter.

## 2018-04-30 ENCOUNTER — Encounter (HOSPITAL_BASED_OUTPATIENT_CLINIC_OR_DEPARTMENT_OTHER): Payer: Self-pay | Admitting: Urology

## 2018-05-04 DIAGNOSIS — C672 Malignant neoplasm of lateral wall of bladder: Secondary | ICD-10-CM | POA: Diagnosis not present

## 2018-05-10 DIAGNOSIS — R300 Dysuria: Secondary | ICD-10-CM | POA: Diagnosis not present

## 2018-05-24 DIAGNOSIS — N130 Hydronephrosis with ureteropelvic junction obstruction: Secondary | ICD-10-CM | POA: Diagnosis not present

## 2018-05-24 DIAGNOSIS — C672 Malignant neoplasm of lateral wall of bladder: Secondary | ICD-10-CM | POA: Diagnosis not present

## 2018-05-24 DIAGNOSIS — N3000 Acute cystitis without hematuria: Secondary | ICD-10-CM | POA: Diagnosis not present

## 2018-05-28 DIAGNOSIS — N130 Hydronephrosis with ureteropelvic junction obstruction: Secondary | ICD-10-CM | POA: Diagnosis not present

## 2018-05-28 DIAGNOSIS — N3000 Acute cystitis without hematuria: Secondary | ICD-10-CM | POA: Diagnosis not present

## 2018-05-28 DIAGNOSIS — C672 Malignant neoplasm of lateral wall of bladder: Secondary | ICD-10-CM | POA: Diagnosis not present

## 2018-05-30 DIAGNOSIS — C672 Malignant neoplasm of lateral wall of bladder: Secondary | ICD-10-CM | POA: Diagnosis not present

## 2018-05-30 DIAGNOSIS — Z5111 Encounter for antineoplastic chemotherapy: Secondary | ICD-10-CM | POA: Diagnosis not present

## 2018-06-06 DIAGNOSIS — Z5111 Encounter for antineoplastic chemotherapy: Secondary | ICD-10-CM | POA: Diagnosis not present

## 2018-06-06 DIAGNOSIS — C672 Malignant neoplasm of lateral wall of bladder: Secondary | ICD-10-CM | POA: Diagnosis not present

## 2018-06-13 DIAGNOSIS — Z5111 Encounter for antineoplastic chemotherapy: Secondary | ICD-10-CM | POA: Diagnosis not present

## 2018-06-13 DIAGNOSIS — C672 Malignant neoplasm of lateral wall of bladder: Secondary | ICD-10-CM | POA: Diagnosis not present

## 2018-06-20 DIAGNOSIS — Z5111 Encounter for antineoplastic chemotherapy: Secondary | ICD-10-CM | POA: Diagnosis not present

## 2018-06-20 DIAGNOSIS — C672 Malignant neoplasm of lateral wall of bladder: Secondary | ICD-10-CM | POA: Diagnosis not present

## 2018-06-22 DIAGNOSIS — N130 Hydronephrosis with ureteropelvic junction obstruction: Secondary | ICD-10-CM | POA: Diagnosis not present

## 2018-06-27 DIAGNOSIS — C672 Malignant neoplasm of lateral wall of bladder: Secondary | ICD-10-CM | POA: Diagnosis not present

## 2018-06-27 DIAGNOSIS — Z5111 Encounter for antineoplastic chemotherapy: Secondary | ICD-10-CM | POA: Diagnosis not present

## 2018-07-04 DIAGNOSIS — C672 Malignant neoplasm of lateral wall of bladder: Secondary | ICD-10-CM | POA: Diagnosis not present

## 2018-07-04 DIAGNOSIS — Z5111 Encounter for antineoplastic chemotherapy: Secondary | ICD-10-CM | POA: Diagnosis not present

## 2018-08-29 ENCOUNTER — Other Ambulatory Visit: Payer: Self-pay | Admitting: Family Medicine

## 2018-09-14 DIAGNOSIS — C672 Malignant neoplasm of lateral wall of bladder: Secondary | ICD-10-CM | POA: Diagnosis not present

## 2018-10-16 ENCOUNTER — Other Ambulatory Visit: Payer: Self-pay | Admitting: Hematology and Oncology

## 2018-10-16 DIAGNOSIS — Z9889 Other specified postprocedural states: Secondary | ICD-10-CM

## 2018-11-09 ENCOUNTER — Other Ambulatory Visit: Payer: Self-pay | Admitting: Family Medicine

## 2018-11-16 ENCOUNTER — Telehealth: Payer: Self-pay

## 2018-11-16 NOTE — Telephone Encounter (Signed)
Questions for Screening COVID-19  Symptom onset: none  Travel or Contacts: none  During this illness, did/does the patient experience any of the following symptoms? Fever >100.59F []   Yes [x]   No []   Unknown Subjective fever (felt feverish) []   Yes [x]   No []   Unknown Chills []   Yes [x]   No []   Unknown Muscle aches (myalgia) []   Yes [x]   No []   Unknown Runny nose (rhinorrhea) []   Yes [x]   No []   Unknown Sore throat []   Yes [x]   No []   Unknown Cough (new onset or worsening of chronic cough) []   Yes [x]   No []   Unknown Shortness of breath (dyspnea) []   Yes [x]   No []   Unknown Nausea or vomiting []   Yes [x]   No []   Unknown Headache []   Yes [x]   No []   Unknown Abdominal pain  []   Yes [x]   No []   Unknown Diarrhea (?3 loose/looser than normal stools/24hr period) []   Yes [x]   No []   Unknown Other, specify:

## 2018-11-18 DIAGNOSIS — E785 Hyperlipidemia, unspecified: Secondary | ICD-10-CM | POA: Insufficient documentation

## 2018-11-18 DIAGNOSIS — E119 Type 2 diabetes mellitus without complications: Secondary | ICD-10-CM | POA: Insufficient documentation

## 2018-11-18 NOTE — Progress Notes (Signed)
Subjective:   Patient ID: Briana Ochoa, female    DOB: Jun 18, 1940, 78 y.o.   MRN: [id number]  Briana Ochoa is a pleasant 78 y.o. year old female who presents to clinic today with Follow-up (Pt is here today for a F/U. She got her flu shot on Saturday. She is not currently fasting.)  on 11/19/2018  HPI:  Follow up- I last saw patient on 09/28/17 for complaint of weakness.  Note reviewed.  At that time, she stated that she has felt weak and fatigued for the couple of months but it had become acutely worse over the week prior.  She also had noticed several other symptoms, including becoming progressively more fatigued and short of breath with small tasks.  No chest pain.  About a month prior to her 09/28/17 OV, she felt her urine became darker and was experiencing intermittent dysuria since.  She also felt as if her abdomen was more distended.  She does have a history of left breast cancer (DCIS) s/p lumpectomy and radiation, but followed closely by her excellent oncologist, Dr. Lindi Adie.  She last saw him on 02/26/18-  Has been on arimidex since 11/2013.   She is now 5 years cancer free and no longer taking arimedex.  Mammogram scheduled for 11/30/18.  Upon further questioning, she was having some mid upper back pain intermittently as well.  She is a diabetic but had been checking her blood sugars at home and still does not as she has been diet controlled.   Lab Results  Component Value Date   HGBA1C 6.6 (H) 09/28/2017   Based on her presentation, I was concerned about metastatic breast CA or pershaps a new malignancy given persistent hematuria.  I did order stat labs, CXR and CT of abdomen and pelvis.  Unfortunately, a CT of her abdomen and pelvis with contrast from 09/29/17, did show a bladder mass that was highly concerning for malignancy.  I referred her urgently to urology for cystoscopy.  Cystoscopy done by urology did confirm bladder tumor and on 10/1, Dr.  Alyson Ingles (urology) performed a transurethral resection of the bladder tumor and placed a left JJ ureteral stent- Op Note reviewed. Unfortunately pathology showed did show Infiltrative high-grade papillary urothelial carcinoma involving lamina propria at the level below muscularis mucosa.  Muscularis propria is present in path report and not involved.  Unfortunately, she had to have another resection done on 12/14/17.  Op note reviewed.  On 04/28/18 she had a left nephroureteral catheter placed.  She has completed her BCG treatments.  HLD- she is taking pravachol 20 mg daily.  She is having more myalgias and questions if it's from the statin.  Lab Results  Component Value Date   CHOL 172 01/02/2017   HDL 66.50 01/02/2017   LDLCALC 73 01/02/2017   TRIG 160.0 (H) 01/02/2017   CHOLHDL 3 01/02/2017   Lab Results  Component Value Date   ALT 8 09/28/2017   AST 11 09/28/2017   ALKPHOS 81 09/28/2017   BILITOT 0.3 09/28/2017   The ASCVD Risk score Mikey Bussing DC Jr., et al., 2013) failed to calculate for the following reasons:   The patient has a prior MI or stroke diagnosis  HTN- has been well controlled on lisinopril 20 mg daily.  BP Readings from Last 3 Encounters:  11/19/18 126/78  04/29/18 126/64  02/26/18 (!) 155/92   Lab Results  Component Value Date   CREATININE 1.40 (H) 04/28/2018   Lab Results  Component Value Date  WBC 9.7 04/28/2018   HGB 12.7 04/28/2018   HCT 41.5 04/28/2018   MCV 91.2 04/28/2018   PLT 371 04/28/2018   Iron/TIBC/Ferritin/ %Sat    Component Value Date/Time   FERRITIN 23.5 09/28/2017 1227    Current Outpatient Medications on File Prior to Visit  Medication Sig Dispense Refill  . acetaminophen (TYLENOL) 325 MG tablet Take 1,000 mg by mouth every morning.     [name] aspirin EC 81 MG tablet Take 81 mg by mouth every morning.     [name] glucose blood (TRUE METRIX BLOOD GLUCOSE TEST) test strip Use as instructed to test blood sugar once daily E08.638 100 each 2    No current facility-administered medications on file prior to visit.     No Known Allergies  Past Medical History:  Diagnosis Date  . Aortic atherosclerosis ([place])   . Bladder cancer ([place])   . Bladder tumor   . Bloody diarrhea 09/29/2017  . CKD (chronic kidney disease), stage III   . Diabetes mellitus type 2, diet-controlled ([place])   . Dyspnea 09/28/2017  . Dysuria   . Fatigue 09/28/2017  . Frequency of urination   . Grade I diastolic dysfunction [id number]   Noted on ECHO  . Gross hematuria 09/29/2017  . Heart murmur    since rheumatic fever as child  . Hematuria   . History of cardiomegaly 02/12/2005   Mild, noted on CXR  . History of CVA (cerebrovascular accident) 08/05/2014   post op cerebral angiogram, cerebral thrombosis w/ cerebral infarction;  11-02-2017  per pt no residuals  . History of malignant melanoma of skin 12/2015   s/p wide local excision right lower leg (per pt localized)  . History of rheumatic fever as a child   . Hypertension   . Intracranial aneurysm dx 07/ 2015   paraophthalmic RICA aneurysm /   s/p  Pipeline embolization right ICA 01-09-2014  . Jaundice 09/28/2017  . Malignant neoplasm of central portion of left breast in female, estrogen receptor positive [facility]) oncologist-  dr Lindi Adie   dx 07/ 2015--- DCIS, ER/PR positive-- s/p  left breast lumpectomy 09-19-2013,  completed radiation 11-12-2013,  started arimidex 11-12-2013  . OA (osteoarthritis)    knees, hands,  shoulder  . Osteopenia 10/16/2012   Diffuse  . Personal history of radiation therapy   . 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 Sigmoid diverticulosis   . Weakness 09/28/2017  . Wears dentures    upper  . Wears glasses   . Wears hearing aid in both ears     Past Surgical History:  Procedure Laterality Date  . BREAST LUMPECTOMY Left 2015  . BREAST LUMPECTOMY WITH NEEDLE LOCALIZATION Left 09/19/2013   Procedure: LEFT BREAST LUMPECTOMY WITH DOUBLE WIRE BRACKETED  NEEDLE  LOCALIZATION;  Surgeon: [name], MD;  Location: [facility];  Service: General;  Laterality: Left;  . CATARACT EXTRACTION W/ INTRAOCULAR LENS  IMPLANT, BILATERAL  06/2015  . [place]  . CHOLECYSTECTOMY OPEN  1970s  . CYSTOSCOPY W/ URETERAL STENT PLACEMENT Left 11/07/2017   Procedure: CYSTOSCOPY WITH RETROGRADE PYELOGRAM/URETERAL STENT PLACEMENT;  Surgeon: Cleon Gustin, MD;  Location: [facility];  Service: Urology;  Laterality: Left;  . CYSTOSCOPY W/ URETERAL STENT PLACEMENT Bilateral 04/27/2018   Procedure: CYSTOSCOPY WITH RETROGRADE PYELOGRAM;  Surgeon: Cleon Gustin, MD;  Location[facility];  Service: Urology;  Laterality: Bilateral;  . FINGER SURGERY     lt thumb  from dog bite  . IR  NEPHROURETERAL CATH PLACE LEFT  04/28/2018  . MELANOMA EXCISION Right 12/28/2015   Procedure: EXCISION MELANOMA RIGHT LOWER LEG;  Surgeon: Fanny Skates, MD;  Location: [place];  Service: General;  Laterality: Right;  . RADIOLOGY WITH ANESTHESIA N/A 01/09/2014   Procedure: Embolization;  Surgeon: Consuella Lose, MD;  Location: [place];  Service: Radiology;  Laterality: N/A;  . SPLENECTOMY, TOTAL  2009 approx.   splen injury due to fall off horse  . TONSILLECTOMY  child  . TRANSURETHRAL RESECTION OF BLADDER TUMOR N/A 11/07/2017   Procedure: TRANSURETHRAL RESECTION OF BLADDER TUMOR (TURBT), POSSIBLE STENT PLACEMENT;  Surgeon: Cleon Gustin, MD;  Location: [facility];  Service: Urology;  Laterality: N/A;  . TRANSURETHRAL RESECTION OF BLADDER TUMOR N/A 12/14/2017   Procedure: TRANSURETHRAL RESECTION OF BLADDER TUMOR (TURBT);  Surgeon: Cleon Gustin, MD;  Location: [facility];  Service: Urology;  Laterality: N/A;  30 MINS  . TRANSURETHRAL RESECTION OF BLADDER TUMOR N/A 04/27/2018   Procedure: TRANSURETHRAL RESECTION OF BLADDER TUMOR (TURBT);  Surgeon: Cleon Gustin, MD;  Location: [facility];  Service: Urology;  Laterality: N/A;  30 MINS    Family History  Problem Relation Age of Onset  . Cancer Mother        kidney  . Hypertension Mother   . Heart attack Paternal Grandfather   . Breast cancer Maternal Aunt 80  . Breast cancer Paternal Aunt 72    Social History   Socioeconomic History  . Marital status: Widowed    Spouse name: Not on file  . Number of children: Not on file  . Years of education: Not on file  . Highest education level: Not on file  Occupational History    Comment: Retired  Scientific laboratory technician  . Financial resource strain: Not on file  . Food insecurity    Worry: Patient refused    Inability: Patient refused  . Transportation needs    Medical: Patient refused    Non-medical: Patient refused  Tobacco Use  . Smoking status: Former Smoker    Packs/day: 1.50    Years: 40.00    Pack years: 60.00    Types: Cigarettes    Quit date: 03/06/2007    Years since quitting: 11.7  . Smokeless tobacco: Never Used  Substance and Sexual Activity  . Alcohol use: No  . Drug use: Never  . Sexual activity: Not on file  Lifestyle  . Physical activity    Days per week: Patient refused    Minutes per session: Patient refused  . Stress: Not at all  Relationships  . Social Herbalist on phone: Patient refused    Gets together: Patient refused    Attends religious service: Patient refused    Active member of club or organization: Patient refused    Attends meetings of clubs or organizations: Patient refused    Relationship status: Patient refused  . Intimate partner violence    Fear of current or ex partner: Patient refused    Emotionally abused: Patient refused    Physically abused: Patient refused    Forced sexual activity: Patient refused  Other Topics Concern  . Not on file  Social History Narrative   Widowed 2013   5 children   17 grandchildren      Does not have a living will-desires CPR, does not want prolonged life  support if futile.   The PMH,  PSH, Social History, Family History, Medications, and allergies have been reviewed in [facility], and have been updated if relevant.  .  Review of Systems  Constitutional: Negative.   HENT: Negative.   Respiratory: Negative.   Cardiovascular: Negative.   Gastrointestinal: Negative.   Endocrine: Negative.   Genitourinary: Negative.   Musculoskeletal: Positive for myalgias.  Skin: Negative.   Allergic/Immunologic: Negative.   Neurological: Negative.   Hematological: Negative.   Psychiatric/Behavioral: Negative.   All other systems reviewed and are negative.      Objective:    BP 126/78 (BP Location: Left Arm, Patient Position: Sitting, Cuff Size: Normal)   Pulse 90   Temp 98.7 F (37.1 C) (Oral)   Wt 203 lb (92.1 kg)   SpO2 93%   BMI 34.84 kg/m   Wt Readings from Last 3 Encounters:  11/19/18 203 lb (92.1 kg)  04/27/18 194 lb 10.7 oz (88.3 kg)  02/26/18 194 lb 12.8 oz (88.4 kg)     Physical Exam Vitals signs and nursing note reviewed.  Constitutional:      General: She is not in acute distress.    Appearance: Normal appearance. She is not ill-appearing.  HENT:     Head: Normocephalic and atraumatic.     Right Ear: Ear canal normal.     Left Ear: Ear canal normal.     Nose: Nose normal.     Mouth/Throat:     Mouth: Mucous membranes are moist.  Eyes:     Extraocular Movements: Extraocular movements intact.  Neck:     Musculoskeletal: Normal range of motion.  Cardiovascular:     Rate and Rhythm: Normal rate and regular rhythm.     Pulses: Normal pulses.     Heart sounds: Normal heart sounds.  Musculoskeletal: Normal range of motion.  Skin:    General: Skin is warm and dry.  Neurological:     General: No focal deficit present.     Mental Status: She is alert.  Psychiatric:        Mood and Affect: Mood normal.        Thought Content: Thought content normal.        Judgment: Judgment normal.           Assessment & Plan:    Malignant neoplasm of central portion of left breast in female, estrogen receptor positive ([place])  Stage 3b chronic kidney disease  Diabetes mellitus type 2, diet-controlled ([place]) - Plan: Hemoglobin A1c  Essential hypertension  Malignant neoplasm of urinary bladder, unspecified site ([place]) - Plan: CBC with Differential/Platelet, Ferritin  Hyperlipidemia, unspecified hyperlipidemia type - Plan: Comprehensive metabolic panel, Lipid panel, TSH  Diabetes mellitus due to underlying condition with other oral complication, without long-term current use of insulin ([place]), Chronic  PVD (peripheral vascular disease) ([place]), Chronic  Morbid obesity ([place]), Chronic No follow-ups on file.

## 2018-11-18 NOTE — Assessment & Plan Note (Signed)
>>  ASSESSMENT AND PLAN FOR HLD (HYPERLIPIDEMIA) WRITTEN ON 11/19/2018 11:45 AM BY ARON, TALIA M, MD  On pravachol  20 mg daily.  We will decrease it today to pravachol  to 10 mg daily since she is on this for secondary stroke prevention. Lab Results  Component Value Date   CHOL 172 01/02/2017   HDL 66.50 01/02/2017   LDLCALC 73 01/02/2017   TRIG 160.0 (H) 01/02/2017   CHOLHDL 3 01/02/2017   Lab Results  Component Value Date   ALT 8 09/28/2017   AST 11 09/28/2017   ALKPHOS 81 09/28/2017   BILITOT 0.3 09/28/2017

## 2018-11-18 NOTE — Assessment & Plan Note (Addendum)
Followed by urology, Dr. Alyson Ingles, urology.   S/p two resections.  She has completed BCG treatments.  Has follow up with Dr. Alyson Ingles next month.

## 2018-11-18 NOTE — Assessment & Plan Note (Addendum)
Followed by Dr. Lindi Adie. Mammogram scheduled for next week. No longer on arimidex.

## 2018-11-18 NOTE — Assessment & Plan Note (Signed)
Well controlled on current dose of Lisinopril 20 mg daily.

## 2018-11-18 NOTE — Assessment & Plan Note (Addendum)
Diet controlled.  Due for labs today. On ACEI for renal protection.  Orders Placed This Encounter  Procedures  . Hemoglobin A1c  . Comprehensive metabolic panel  . Lipid panel  . TSH  . CBC with Differential/Platelet  . Ferritin

## 2018-11-18 NOTE — Assessment & Plan Note (Addendum)
On pravachol 20 mg daily.  We will decrease it today to pravachol to 10 mg daily since she is on this for secondary stroke prevention. Lab Results  Component Value Date   CHOL 172 01/02/2017   HDL 66.50 01/02/2017   LDLCALC 73 01/02/2017   TRIG 160.0 (H) 01/02/2017   CHOLHDL 3 01/02/2017   Lab Results  Component Value Date   ALT 8 09/28/2017   AST 11 09/28/2017   ALKPHOS 81 09/28/2017   BILITOT 0.3 09/28/2017

## 2018-11-18 NOTE — Assessment & Plan Note (Signed)
Repeat labs today. The patient indicates understanding of these issues and agrees with the plan.

## 2018-11-19 ENCOUNTER — Encounter: Payer: Self-pay | Admitting: Family Medicine

## 2018-11-19 ENCOUNTER — Other Ambulatory Visit: Payer: Self-pay

## 2018-11-19 ENCOUNTER — Ambulatory Visit (INDEPENDENT_AMBULATORY_CARE_PROVIDER_SITE_OTHER): Payer: Medicare PPO | Admitting: Family Medicine

## 2018-11-19 VITALS — BP 126/78 | HR 90 | Temp 98.7°F | Wt 203.0 lb

## 2018-11-19 DIAGNOSIS — C50112 Malignant neoplasm of central portion of left female breast: Secondary | ICD-10-CM | POA: Diagnosis not present

## 2018-11-19 DIAGNOSIS — N1832 Chronic kidney disease, stage 3b: Secondary | ICD-10-CM

## 2018-11-19 DIAGNOSIS — I10 Essential (primary) hypertension: Secondary | ICD-10-CM | POA: Diagnosis not present

## 2018-11-19 DIAGNOSIS — E119 Type 2 diabetes mellitus without complications: Secondary | ICD-10-CM | POA: Insufficient documentation

## 2018-11-19 DIAGNOSIS — Z170 Estrogen receptor positive status [ER+]: Secondary | ICD-10-CM | POA: Diagnosis not present

## 2018-11-19 DIAGNOSIS — E08638 Diabetes mellitus due to underlying condition with other oral complications: Secondary | ICD-10-CM | POA: Diagnosis not present

## 2018-11-19 DIAGNOSIS — C679 Malignant neoplasm of bladder, unspecified: Secondary | ICD-10-CM | POA: Diagnosis not present

## 2018-11-19 DIAGNOSIS — E785 Hyperlipidemia, unspecified: Secondary | ICD-10-CM

## 2018-11-19 DIAGNOSIS — I739 Peripheral vascular disease, unspecified: Secondary | ICD-10-CM | POA: Diagnosis not present

## 2018-11-19 LAB — CBC WITH DIFFERENTIAL/PLATELET
Basophils Absolute: 0.1 10*3/uL (ref 0.0–0.1)
Basophils Relative: 0.5 % (ref 0.0–3.0)
Eosinophils Absolute: 0.7 10*3/uL (ref 0.0–0.7)
Eosinophils Relative: 6.5 % — ABNORMAL HIGH (ref 0.0–5.0)
HCT: 42.9 % (ref 36.0–46.0)
Hemoglobin: 13.9 g/dL (ref 12.0–15.0)
Lymphocytes Relative: 13.8 % (ref 12.0–46.0)
Lymphs Abs: 1.5 10*3/uL (ref 0.7–4.0)
MCHC: 32.5 g/dL (ref 30.0–36.0)
MCV: 91.3 fl (ref 78.0–100.0)
Monocytes Absolute: 1 10*3/uL (ref 0.1–1.0)
Monocytes Relative: 9.1 % (ref 3.0–12.0)
Neutro Abs: 7.7 10*3/uL (ref 1.4–7.7)
Neutrophils Relative %: 70.1 % (ref 43.0–77.0)
Platelets: 350 10*3/uL (ref 150.0–400.0)
RBC: 4.7 Mil/uL (ref 3.87–5.11)
RDW: 14.9 % (ref 11.5–15.5)
WBC: 11 10*3/uL — ABNORMAL HIGH (ref 4.0–10.5)

## 2018-11-19 LAB — COMPREHENSIVE METABOLIC PANEL
ALT: 10 U/L (ref 0–35)
AST: 15 U/L (ref 0–37)
Albumin: 3.9 g/dL (ref 3.5–5.2)
Alkaline Phosphatase: 83 U/L (ref 39–117)
BUN: 22 mg/dL (ref 6–23)
CO2: 26 mEq/L (ref 19–32)
Calcium: 9.8 mg/dL (ref 8.4–10.5)
Chloride: 103 mEq/L (ref 96–112)
Creatinine, Ser: 1.47 mg/dL — ABNORMAL HIGH (ref 0.40–1.20)
GFR: 34.34 mL/min — ABNORMAL LOW (ref >60.00)
Glucose, Bld: 157 mg/dL — ABNORMAL HIGH (ref 70–99)
Potassium: 4.8 mEq/L (ref 3.5–5.1)
Sodium: 138 mEq/L (ref 135–145)
Total Bilirubin: 0.3 mg/dL (ref 0.2–1.2)
Total Protein: 6.8 g/dL (ref 6.0–8.3)

## 2018-11-19 LAB — LIPID PANEL
Cholesterol: 162 mg/dL (ref 0–200)
HDL: 63.1 mg/dL (ref >39.00)
LDL Cholesterol: 61 mg/dL (ref 0–99)
NonHDL: 98.73
Total CHOL/HDL Ratio: 3
Triglycerides: 187 mg/dL — ABNORMAL HIGH (ref 0.0–149.0)
VLDL: 37.4 mg/dL (ref 0.0–40.0)

## 2018-11-19 LAB — TSH: TSH: 2.28 u[IU]/mL (ref 0.35–4.50)

## 2018-11-19 LAB — HEMOGLOBIN A1C: Hgb A1c MFr Bld: 7.6 % — ABNORMAL HIGH (ref 4.6–6.5)

## 2018-11-19 LAB — FERRITIN: Ferritin: 25 ng/mL (ref 10.0–291.0)

## 2018-11-19 MED ORDER — PRAVASTATIN SODIUM 10 MG PO TABS: 10.0000 mg | ORAL_TABLET | Freq: Every day | ORAL | 3 refills | Status: DC

## 2018-11-19 MED ORDER — LISINOPRIL 20 MG PO TABS: ORAL_TABLET | ORAL | 3 refills | Status: DC

## 2018-11-19 MED ORDER — PRAVASTATIN SODIUM 20 MG PO TABS: ORAL_TABLET | ORAL | 3 refills | Status: DC

## 2018-11-19 NOTE — Patient Instructions (Addendum)
Great to see you. I will call you with your lab results from today and you can view them online.   We are decreasing your pravachol to 10 mg daily.  Congratulations on being 5 years breast cancer free!!!

## 2018-11-20 ENCOUNTER — Other Ambulatory Visit: Payer: Self-pay

## 2018-11-20 MED ORDER — PRAVASTATIN SODIUM 10 MG PO TABS: 10.0000 mg | ORAL_TABLET | Freq: Every day | ORAL | 3 refills | Status: DC

## 2018-11-22 ENCOUNTER — Encounter: Payer: Self-pay | Admitting: Family Medicine

## 2018-12-07 ENCOUNTER — Ambulatory Visit
Admission: RE | Admit: 2018-12-07 | Discharge: 2018-12-07 | Disposition: A | Discharge status: Home / Self Care | Payer: Medicare PPO | Source: Ambulatory Visit | Attending: Hematology and Oncology | Admitting: Hematology and Oncology

## 2018-12-07 ENCOUNTER — Other Ambulatory Visit: Payer: Self-pay

## 2018-12-07 DIAGNOSIS — Z9889 Other specified postprocedural states: Secondary | ICD-10-CM

## 2018-12-07 DIAGNOSIS — Z853 Personal history of malignant neoplasm of breast: Secondary | ICD-10-CM | POA: Diagnosis not present

## 2018-12-07 DIAGNOSIS — R928 Other abnormal and inconclusive findings on diagnostic imaging of breast: Secondary | ICD-10-CM | POA: Diagnosis not present

## 2018-12-14 DIAGNOSIS — C672 Malignant neoplasm of lateral wall of bladder: Secondary | ICD-10-CM | POA: Diagnosis not present

## 2019-01-23 ENCOUNTER — Telehealth: Payer: Self-pay | Admitting: Hematology and Oncology

## 2019-01-23 NOTE — Telephone Encounter (Signed)
[place] 1/20 moved f/u from AM to PM.

## 2019-01-24 ENCOUNTER — Encounter: Payer: Self-pay | Admitting: Family Medicine

## 2019-02-26 NOTE — Progress Notes (Signed)
Patient Care Team: Lucille Passy, MD as PCP - General (Family Medicine) Fanny Skates, MD as Consulting Physician (General Surgery) Thea Silversmith, MD as Consulting Physician (Radiation Oncology) Nicholas Lose, MD as Consulting Physician (Hematology and Oncology) Garvin Fila, MD as Consulting Physician (Neurology) Consuella Lose, MD as Consulting Physician (Neurosurgery) Katy Apo, MD as Consulting Physician (Ophthalmology)  DIAGNOSIS:    ICD-10-CM   1. Malignant neoplasm of central portion of left breast in female, estrogen receptor positive ([place])  C50.112    Z17.0   2. Malignant neoplasm of urinary bladder, unspecified site ([place])  C67.9     SUMMARY OF ONCOLOGIC HISTORY: Oncology History  Cancer of central portion of left female breast ([place])  08/20/2013 Initial Diagnosis   Left breast 12:00: DCIS with calcifications, yet 100%, PR 100%; 09/04/2013 second group of calcifications also biopsied proven to be DCIS ER/PR positive   09/19/2013 Surgery   Left breast lumpectomy DCIS 2 foci margins negative, 0.2 and 0.3 cm ER 100% PR 100%   10/08/2013 - 11/12/2013 Radiation Therapy   Adjuvant radiation therapy   11/12/2013 -  Anti-estrogen oral therapy   Anastrozole 1 mg daily plan is for 5 years   01/09/2014 - 01/10/2014 Hospital Admission   Pipeline embolization RICA aneurysm in the brain   Bladder cancer ([place])  02/09/2018 Initial Diagnosis   Bladder cancer ([place])     CHIEF COMPLIANT: Follow-up of DCIS    INTERVAL HISTORY: Briana Ochoa is a 79 y.o. with above-mentioned history of left breast cancer treated with lumpectomy, radiation, and who is currently on antiestrogen therapy with anastrozole. She also has a history of bladder cancer. Mammogram on 12/07/18 showed no evidence of malignancy bilaterally. She presents to the clinic today for annual follow-up.  She is doing quite well without any problems or concerns.  She is going to get her Covid vaccine on  February 1.  ALLERGIES:  has No Known Allergies.  MEDICATIONS:  Current Outpatient Medications  Medication Sig Dispense Refill  . acetaminophen (TYLENOL) 325 MG tablet Take 1,000 mg by mouth every morning.     [name] aspirin EC 81 MG tablet Take 81 mg by mouth every morning.     [name] glucose blood (TRUE METRIX BLOOD GLUCOSE TEST) test strip Use as instructed to test blood sugar once daily E08.638 100 each 2  . lisinopril (ZESTRIL) 20 MG tablet TAKE 1 TABLET EVERY DAY 90 tablet 3  . pravastatin (PRAVACHOL) 10 MG tablet Take 1 tablet (10 mg total) by mouth daily. 90 tablet 3   No current facility-administered medications for this visit.    PHYSICAL EXAMINATION: ECOG PERFORMANCE STATUS: 1 - Symptomatic but completely ambulatory  Vitals:   02/27/19 1450  BP: 138/78  Pulse: 82  Resp: 18  Temp: 98.1 F (36.7 C)  SpO2: 96%   Filed Weights   02/27/19 1450  Weight: 197 lb 11.2 oz (89.7 kg)    BREAST: No palpable masses or nodules in either right or left breasts. No palpable axillary supraclavicular or infraclavicular adenopathy no breast tenderness or nipple discharge. (exam performed in the presence of a chaperone)  LABORATORY DATA:  I have reviewed the data as listed CMP Latest Ref Rng & Units 11/19/2018 04/28/2018 04/27/2018  Glucose 70 - 99 mg/dL 157(H) 139(H) 144(H)  BUN 6 - 23 mg/dL 22 23 22   Creatinine 0.40 - 1.20 mg/dL 1.47(H) 1.40(H) 1.54(H)  Sodium 135 - 145 mEq/L 138 139 141  Potassium 3.5 - 5.1 mEq/L 4.8 4.7 5.2(H)  Chloride 96 - 112 mEq/L 103 108 107  CO2 19 - 32 mEq/L 26 25 26   Calcium 8.4 - 10.5 mg/dL 9.8 9.0 9.3  Total Protein 6.0 - 8.3 g/dL 6.8 - -  Total Bilirubin 0.2 - 1.2 mg/dL 0.3 - -  Alkaline Phos 39 - 117 U/L 83 - -  AST 0 - 37 U/L 15 - -  ALT 0 - 35 U/L 10 - -    Lab Results  Component Value Date   WBC 11.0 (H) 11/19/2018   HGB 13.9 11/19/2018   HCT 42.9 11/19/2018   MCV 91.3 11/19/2018   PLT 350.0 11/19/2018   NEUTROABS 7.7 11/19/2018     ASSESSMENT & PLAN:  Cancer of central portion of left female breast DCIS left breast ER 100%, P 100% diagnosis 08/20/2013 status post lumpectomy 09/19/2013; 2 foci measuring 0.2 and 0.3 cm, status post adjuvant radiation therapy, currently on Arimidex since October 2015 completed October 2020    Breast cancer surveillance: 1. Breast exam 02/27/2019 benign 2. Mammograms10/30/2020: Breast density category C, normal    Bladder cancer ([place]) Transurethral resection of the bladder tumor 11/07/2017: Infiltrative high-grade papillary urothelial carcinoma involves lamina propria at the level below muscularis mucosa.  Muscularis propria is present and not involved  12/14/2017: TURBT: No evidence of malignancy Under the care of urology  RTC 1 yr   No orders of the defined types were placed in this encounter.  The patient has a good understanding of the overall plan. she agrees with it. she will call with any problems that may develop before the next visit here.  Total time spent: 15 mins including face to face time and time spent for planning, charting and coordination of care  Nicholas Lose, MD 02/27/2019  I, Cloyde Reams Dorshimer, am acting as scribe for Dr. Nicholas Lose.  I have reviewed the above documentation for accuracy and completeness, and I agree with the above.

## 2019-02-27 ENCOUNTER — Inpatient Hospital Stay: Payer: Medicare PPO | Attending: Hematology and Oncology | Admitting: Hematology and Oncology

## 2019-02-27 ENCOUNTER — Other Ambulatory Visit: Payer: Self-pay

## 2019-02-27 DIAGNOSIS — C50112 Malignant neoplasm of central portion of left female breast: Secondary | ICD-10-CM | POA: Diagnosis not present

## 2019-02-27 DIAGNOSIS — Z170 Estrogen receptor positive status [ER+]: Secondary | ICD-10-CM

## 2019-02-27 DIAGNOSIS — Z79899 Other long term (current) drug therapy: Secondary | ICD-10-CM | POA: Insufficient documentation

## 2019-02-27 DIAGNOSIS — Z7982 Long term (current) use of aspirin: Secondary | ICD-10-CM | POA: Diagnosis not present

## 2019-02-27 DIAGNOSIS — C679 Malignant neoplasm of bladder, unspecified: Secondary | ICD-10-CM | POA: Diagnosis not present

## 2019-02-27 DIAGNOSIS — Z79811 Long term (current) use of aromatase inhibitors: Secondary | ICD-10-CM | POA: Diagnosis not present

## 2019-02-27 DIAGNOSIS — Z923 Personal history of irradiation: Secondary | ICD-10-CM | POA: Diagnosis not present

## 2019-02-27 NOTE — Assessment & Plan Note (Signed)
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 Patient denies any hot flashes or myalgias 2. Bone density March 2018: T score -1.5,no change compared to before  Breast cancer surveillance: 1. Breast exam 02/27/2019 benign 2. Mammograms10/30/2020: Breast density category C, normal  RTC 1 yr

## 2019-02-27 NOTE — Assessment & Plan Note (Signed)
Transurethral resection of the bladder tumor 11/07/2017: Infiltrative high-grade papillary urothelial carcinoma involves lamina propria at the level below muscularis mucosa.  Muscularis propria is present and not involved  12/14/2017: TURBT: No evidence of malignancy Under the care of urology

## 2019-02-28 ENCOUNTER — Telehealth: Payer: Self-pay | Admitting: Hematology and Oncology

## 2019-02-28 NOTE — Telephone Encounter (Signed)
I talk with patient regarding schedule  

## 2019-03-11 ENCOUNTER — Ambulatory Visit: Payer: Medicare PPO

## 2019-03-17 ENCOUNTER — Ambulatory Visit: Payer: Medicare PPO | Attending: Internal Medicine

## 2019-03-17 DIAGNOSIS — Z23 Encounter for immunization: Secondary | ICD-10-CM | POA: Insufficient documentation

## 2019-03-17 NOTE — Progress Notes (Signed)
   Covid-19 Vaccination Clinic  Name:  Briana Ochoa    MRN: [id number] DOB: Sep 17, 1940  03/17/2019  Briana Ochoa was observed post Covid-19 immunization for 15 minutes without incidence. She was provided with Vaccine Information Sheet and instruction to access the V-Safe system.   Briana Ochoa was instructed to call 911 with any severe reactions post vaccine: [name] Difficulty breathing  . Swelling of your face and throat  . A fast heartbeat  . A bad rash all over your body  . Dizziness and weakness    Immunizations Administered    Name Date Dose VIS Date Route   Pfizer COVID-19 Vaccine 03/17/2019 12:08 PM 0.3 mL 01/18/2019 Intramuscular   Manufacturer: [place]   Lot: [id number]   [place]: [id number]

## 2019-04-04 ENCOUNTER — Encounter: Payer: Self-pay | Admitting: Family Medicine

## 2019-04-04 DIAGNOSIS — H52203 Unspecified astigmatism, bilateral: Secondary | ICD-10-CM | POA: Diagnosis not present

## 2019-04-04 DIAGNOSIS — Z961 Presence of intraocular lens: Secondary | ICD-10-CM | POA: Diagnosis not present

## 2019-04-04 DIAGNOSIS — E119 Type 2 diabetes mellitus without complications: Secondary | ICD-10-CM | POA: Diagnosis not present

## 2019-04-04 DIAGNOSIS — H35371 Puckering of macula, right eye: Secondary | ICD-10-CM | POA: Diagnosis not present

## 2019-04-04 LAB — HM DIABETES EYE EXAM

## 2019-04-10 ENCOUNTER — Ambulatory Visit: Payer: Medicare PPO | Attending: Internal Medicine

## 2019-04-10 DIAGNOSIS — Z23 Encounter for immunization: Secondary | ICD-10-CM | POA: Insufficient documentation

## 2019-04-10 NOTE — Progress Notes (Signed)
   Covid-19 Vaccination Clinic  Name:  Keeanna Freise    MRN: [id number] DOB: 10-Jun-1940  04/10/2019  Ms. Yoho was observed post Covid-19 immunization for 15 minutes without incident. She was provided with Vaccine Information Sheet and instruction to access the V-Safe system.   Ms. Kauzlarich was instructed to call 911 with any severe reactions post vaccine: [name] Difficulty breathing  . Swelling of face and throat  . A fast heartbeat  . A bad rash all over body  . Dizziness and weakness   Immunizations Administered    Name Date Dose VIS Date Route   Pfizer COVID-19 Vaccine 04/10/2019  9:21 AM 0.3 mL 01/18/2019 Intramuscular   Manufacturer: [place]   Lot: [id number]   [place]: [id number]

## 2019-08-28 ENCOUNTER — Encounter: Payer: Self-pay | Admitting: Urology

## 2019-08-28 ENCOUNTER — Other Ambulatory Visit: Payer: Self-pay

## 2019-08-28 ENCOUNTER — Ambulatory Visit: Payer: Medicare PPO | Admitting: Urology

## 2019-08-28 ENCOUNTER — Other Ambulatory Visit: Payer: Medicare PPO | Admitting: Urology

## 2019-08-28 VITALS — BP 130/78 | HR 102 | Temp 97.7°F | Ht 64.0 in | Wt 188.0 lb

## 2019-08-28 DIAGNOSIS — C679 Malignant neoplasm of bladder, unspecified: Secondary | ICD-10-CM | POA: Diagnosis not present

## 2019-08-28 LAB — POCT URINALYSIS DIPSTICK
Bilirubin, UA: NEGATIVE
Blood, UA: NEGATIVE
Clarity, UA: NEGATIVE
Glucose, UA: NEGATIVE
Ketones, UA: NEGATIVE
Leukocytes, UA: NEGATIVE
Nitrite, UA: NEGATIVE
Protein, UA: NEGATIVE
Spec Grav, UA: 1.02 (ref 1.010–1.025)
Urobilinogen, UA: 0.2 E.U./dL
pH, UA: 5 (ref 5.0–8.0)

## 2019-08-28 MED ORDER — CIPROFLOXACIN HCL 500 MG PO TABS
500.0000 mg | ORAL_TABLET | Freq: Once | ORAL | Status: AC
  Administered 2019-08-28: 500 mg via ORAL

## 2019-08-28 NOTE — Progress Notes (Signed)
Urological Symptom Review  Patient is experiencing the following symptoms: Frequent urination Hard to postpone urination Get up at night to urinate Leakage of urine   Review of Systems  Gastrointestinal (upper)  : Negative for upper GI symptoms  Gastrointestinal (lower) : Negative for lower GI symptoms  Constitutional : Negative for symptoms  Skin: Negative for skin symptoms  Eyes: Negative for eye symptoms  Ear/Nose/Throat : Negative for Ear/Nose/Throat symptoms  Hematologic/Lymphatic: Negative for Hematologic/Lymphatic symptoms  Cardiovascular : Negative for cardiovascular symptoms  Respiratory : Negative for respiratory symptoms  Endocrine: Negative for endocrine symptoms  Musculoskeletal: Negative for musculoskeletal symptoms  Neurological: Negative for neurological symptoms  Psychologic: Negative for psychiatric symptoms 

## 2019-08-28 NOTE — Patient Instructions (Signed)
Bladder Cancer  Bladder cancer is an abnormal growth of tissue in the bladder. The bladder is the balloon-like sac in the pelvis. It collects and stores urine that comes from the kidneys through the ureters. The bladder wall is made of layers. If cancer spreads into these layers and through the wall of the bladder, it becomes more difficult to treat. What are the causes? The cause of this condition is not known. What increases the risk? The following factors may make you more likely to develop this condition:  Smoking.  Workplace risks (occupational exposures), such as rubber, leather, textile, dyes, chemicals, and paint.  Being white.  Your age. Most people with bladder cancer are over the age of 55.  Being female.  Having chronic bladder inflammation.  Having a personal history of bladder cancer.  Having a family history of bladder cancer (heredity).  Having had chemotherapy or radiation therapy to the pelvis.  Having been exposed to arsenic. What are the signs or symptoms? Initial symptoms of this condition include:  Blood in the urine.  Painful urination.  Frequent bladder or urine infections.  Increase in urgency and frequency of urination. Advanced symptoms of this condition include:  Not being able to urinate.  Low back pain on one side.  Loss of appetite.  Weight loss.  Fatigue.  Swelling in the feet.  Bone pain. How is this diagnosed? This condition is diagnosed based on your medical history, a physical exam, urine tests, lab tests, imaging tests, and your symptoms. You may also have other tests or procedures done, such as:  A narrow tube being inserted into your bladder through your urethra (cystoscopy) in order to view the lining of your bladder for tumors.  A biopsy to sample the tumor to see if cancer is present. If cancer is present, it will then be staged to determine its severity and extent. Staging is an assessment of:  The size of the  tumor.  Whether the cancer has spread.  Where the cancer has spread. It is important to know how deeply into the bladder wall cancer has grown and whether cancer has spread to any other parts of your body. Staging may require blood tests or imaging tests, such as a CT scan, MRI, bone scan, or chest X-ray. How is this treated? Based on the stage of cancer, one treatment or a combination of treatments may be recommended. The most common forms of treatment are:  Surgery to remove the cancer. Procedures that may be done include transurethral resection and cystectomy.  Radiation therapy. This is high-energy X-rays or other particles. This is often used in combination with chemotherapy.  Chemotherapy. During this treatment, medicines are used to kill cancer cells.  Immunotherapy. This uses medicines to help your own immune system destroy cancer cells. Follow these instructions at home:  Take over-the-counter and prescription medicines only as told by your health care provider.  Maintain a healthy diet. Some of your treatments might affect your appetite.  Consider joining a support group. This may help you learn to cope with the stress of having bladder cancer.  Tell your cancer care team if you develop side effects. They may be able to recommend ways to relieve them.  Keep all follow-up visits as told by your health care provider. This is important. Where to find more information  American Cancer Society: www.cancer.org  National Cancer Institute (NCI): www.cancer.gov Contact a health care provider if:  You have symptoms of a urinary tract infection. These include: ?   Fever. ? Chills. ? Weakness. ? Muscle aches. ? Abdominal pain. ? Frequent and intense urge to urinate. ? Burning feeling in the bladder or urethra during urination. Get help right away if:  There is blood in your urine.  You cannot urinate.  You have severe pain or other symptoms that do not go  away. Summary  Bladder cancer is an abnormal growth of tissue in the bladder.  This condition is diagnosed based on your medical history, a physical exam, urine tests, lab tests, imaging tests, and your symptoms.  Based on the stage of cancer, surgery, chemotherapy, or a combination of treatments may be recommended.  Consider joining a support group. This may help you learn to cope with the stress of having bladder cancer. This information is not intended to replace advice given to you by your health care provider. Make sure you discuss any questions you have with your health care provider. Document Revised: 01/06/2017 Document Reviewed: 12/29/2015 Elsevier Patient Education  2020 Elsevier Inc.  

## 2019-08-28 NOTE — Progress Notes (Signed)
   08/28/19  CC: hx of bladder cancer  HPI:Briana Ochoa is a 79yo here for followup for T1G3 bladder cancer first diagnosed in 11/2017. No worsening LUTS. No hematuria.  Her records from [facility] are as follows: I have bladder cancer. HPI: Briana Ochoa is a 79 year-old female established patient who is here for bladder cancer.  Her problem was diagnosed 11/07/2017. Her bladder cancer was diagnosed by Nicolette Bang. Her cancer was diagnosed at [facility]. The bladder cancer was found because of blood in her urine.   Her bladder cancer was treated by removal with scope. Patient denies removal of the entire bladder, radiation, and chemotherapy.   Her last cysto was 11/07/2017.   She does have a good appetite. BOWEL HABITS: her bowels are moving normally. She is not having pain in new locations.   11/16/2017: left lateral wall tumor pathology revealed T1G3 bladder cancer with detrusor present that was not involved with tumor.   12/22/2017: repeat resection showed no residual tumor   12/29/2017: Patient here for stent removal   04/06/2018: The patient just finished 6 weeks of BCG   05/04/2018: s/p TURBT and left nephroureteral stent placement. Pathology revealed TaG3 with muscle in the specimen   05/24/18: She returns today for nephroureteral stent removal. She was having some dysuria and intermittent hematuria on 3/30 and dropped off urine specimen on 4/2. Culture at the time was positive for E. Coli and she was started on Bactrim on 4/13, which she remain on currently. Today she states that she continues to have some dysuria, urgency, and frequency. She denies hematuria, fevers, or chills. She complains of generalized back pain, but denies unilateral flank pain.   05/28/18: Patient with above noted history. She returns today for left nephroureteral stent removal. Culture from 4/16 was negative and she is scheduled to complete course of Bactrim today. She does complain of some mild left flank pain.  She states that urinary symptoms have improved, though she does have some mild dysuria. She denies fevers or chills.   09/14/2018: Nephroureteral stent removed. No hematuria or dysuria   12/14/2018: NO hematuria or dysuria.   05/24/2019: NO new LUTS, no hematuria or dysuria     Blood pressure 130/78, pulse (!) 102, temperature 97.7 F (36.5 C), height 5\' 4"  (1.626 m), weight 188 lb (85.3 kg). NED. A&Ox3.   No respiratory distress   Abd soft, NT, ND Normal external genitalia with patent urethral meatus  Cystoscopy Procedure Note  Patient identification was confirmed, informed consent was obtained, and patient was prepped using Betadine solution.  Lidocaine jelly was administered per urethral meatus.    Procedure: - Flexible cystoscope introduced, without any difficulty.   - Thorough search of the bladder revealed:    normal urethral meatus    normal urothelium    no stones    no ulcers     no tumors    no urethral polyps    no trabeculation  - Ureteral orifices were normal in position and appearance.  Post-Procedure: - Patient tolerated the procedure well  Assessment/ Plan: RTC 3 months for cystoscopy. If cystoscopy is negative in 3 months we will proceed with 6 month surveillance   No follow-ups on file.  Nicolette Bang, MD

## 2019-09-23 ENCOUNTER — Other Ambulatory Visit: Payer: Self-pay

## 2019-09-23 NOTE — Telephone Encounter (Signed)
Last seen on 11/19/18  Chart supports Rx Lisinopril 20 mg #90/3 refills Next appt 09/27/19 (TOC from Dr. Deborra Medina) Called patient and lvm for patient to return call to see if she can discuss and wait for this Rx until her appt.    Patient returned call and states she still has medication and will discuss refills at f/u appointment.

## 2019-09-26 ENCOUNTER — Other Ambulatory Visit: Payer: Self-pay

## 2019-09-27 ENCOUNTER — Encounter: Payer: Medicare PPO | Admitting: Nurse Practitioner

## 2019-10-07 ENCOUNTER — Telehealth: Payer: Self-pay | Admitting: General Practice

## 2019-10-07 NOTE — Telephone Encounter (Signed)
Received faxed refill request for Lisinopril. Patient had cancelled TOC appt 09/27/19 to [facility]. Called patient and LVM to reschedule TOC for medication refills.

## 2019-10-08 NOTE — Telephone Encounter (Signed)
Spoke with patient because we received another Rx refill and she states she made another appointment for 11/11/2019 and she said she will have enough medication to last her until her appointment.

## 2019-10-25 ENCOUNTER — Other Ambulatory Visit: Payer: Self-pay | Admitting: Hematology and Oncology

## 2019-10-25 DIAGNOSIS — Z85841 Personal history of malignant neoplasm of brain: Secondary | ICD-10-CM

## 2019-11-11 ENCOUNTER — Encounter: Payer: Self-pay | Admitting: Nurse Practitioner

## 2019-11-11 ENCOUNTER — Other Ambulatory Visit: Payer: Self-pay

## 2019-11-11 ENCOUNTER — Ambulatory Visit (INDEPENDENT_AMBULATORY_CARE_PROVIDER_SITE_OTHER): Payer: Medicare PPO | Admitting: Nurse Practitioner

## 2019-11-11 VITALS — BP 130/80 | HR 80 | Temp 97.3°F | Ht 64.0 in | Wt 182.3 lb

## 2019-11-11 DIAGNOSIS — Z23 Encounter for immunization: Secondary | ICD-10-CM

## 2019-11-11 DIAGNOSIS — N1832 Chronic kidney disease, stage 3b: Secondary | ICD-10-CM | POA: Diagnosis not present

## 2019-11-11 DIAGNOSIS — I10 Essential (primary) hypertension: Secondary | ICD-10-CM

## 2019-11-11 DIAGNOSIS — E1141 Type 2 diabetes mellitus with diabetic mononeuropathy: Secondary | ICD-10-CM | POA: Diagnosis not present

## 2019-11-11 DIAGNOSIS — E782 Mixed hyperlipidemia: Secondary | ICD-10-CM

## 2019-11-11 NOTE — Assessment & Plan Note (Addendum)
Controlled with diet Upcoming appt for annual eye exam Decreased vibration sensation in feet (R>L), advised about need for footwear at all time and proper foot care. table renal and liver function. Persistent CKD-stage 3 Normal lipid panel, but with your risk for cardiovascular disese at 53.7%, I recommend changing pravastatin to crestor. Are you ok with that? Controlled Dm with HgbA1c at 6.9. continue diet and exercise Abnormal urine microalbumin, continue lisinopril for BP and renal protection Maintain adequate oral hydration with water F/up in 41months

## 2019-11-11 NOTE — Assessment & Plan Note (Signed)
>>  ASSESSMENT AND PLAN FOR HLD (HYPERLIPIDEMIA) WRITTEN ON 11/12/2019 12:55 PM BY Shanece Cochrane LUM, NP  Previous lipid panel indicates LDL at goal but elevated triglyceride Current use of pravastatin  with no adverse side effects.  Normal lipid panel, but with your risk for cardiovascular disese at 53.7%, need to change pravastatin  to crestor? Waiting for response from patient

## 2019-11-11 NOTE — Progress Notes (Signed)
Subjective:  Patient ID: Briana Ochoa, female    DOB: 29-Feb-1940  Age: 80 y.o. MRN: [id number]  CC: Establish Care (TOC-[facility]/Medication refills)  HPI  HTN (hypertension) BP at goal with lisinopril BP Readings from Last 3 Encounters:  11/11/19 130/80  08/28/19 130/78  02/27/19 138/78   Maintain current medication Repeat BMP today  Diabetes mellitus ([place]) Controlled with diet Upcoming appt for annual eye exam Decreased vibration sensation in feet (R>L), advised about need for footwear at all time and proper foot care. table renal and liver function. Persistent CKD-stage 3 Normal lipid panel, but with your risk for cardiovascular disese at 53.7%, I recommend changing pravastatin to crestor. Are you ok with that? Controlled Dm with HgbA1c at 6.9. continue diet and exercise Abnormal urine microalbumin, continue lisinopril for BP and renal protection Maintain adequate oral hydration with water F/up in 49months  HLD (hyperlipidemia) Previous lipid panel indicates LDL at goal but elevated triglyceride Current use of pravastatin with no adverse side effects.  Normal lipid panel, but with your risk for cardiovascular disese at 53.7%, need to change pravastatin to crestor? Waiting for response from patient  CKD (chronic kidney disease) stage 3, GFR 30-59 ml/min Stable renal function BP at goal with lisinopril. Controlled Dm with HgbA1c at 6.9. continue diet and exercise Abnormal urine microalbumin, continue lisinopril for BP and renal protection Maintain adequate oral hydration with water F/up in 25months    Reviewed past Medical, Social and Family history today.  Outpatient Medications Prior to Visit  Medication Sig Dispense Refill   acetaminophen (TYLENOL) 325 MG tablet Take 1,000 mg by mouth every morning.      aspirin EC 81 MG tablet Take 81 mg by mouth every morning.      glucose blood (TRUE METRIX BLOOD GLUCOSE TEST) test strip Use as instructed to test blood  sugar once daily E08.638 100 each 2   lisinopril (ZESTRIL) 20 MG tablet TAKE 1 TABLET EVERY DAY 90 tablet 3   pravastatin (PRAVACHOL) 10 MG tablet Take 1 tablet (10 mg total) by mouth daily. 90 tablet 3   No facility-administered medications prior to visit.    ROS See HPI  Objective:  BP 130/80 (BP Location: Left Arm, Patient Position: Sitting, Cuff Size: Normal)    Pulse 80    Temp (!) 97.3 F (36.3 C) (Temporal)    Ht 5\' 4"  (1.626 m)    Wt 182 lb 4.8 oz (82.7 kg)    SpO2 97%    BMI 31.29 kg/m   Physical Exam Constitutional:      Appearance: She is obese.  Cardiovascular:     Rate and Rhythm: Normal rate and regular rhythm.     Pulses: Normal pulses.     Heart sounds: Normal heart sounds.  Pulmonary:     Effort: Pulmonary effort is normal.     Breath sounds: Normal breath sounds.  Abdominal:     General: Bowel sounds are normal.     Palpations: Abdomen is soft.  Musculoskeletal:     Right lower leg: No edema.     Left lower leg: No edema.  Skin:    General: Skin is warm and dry.     Comments: Completed DM foot exam today  Neurological:     Mental Status: She is alert and oriented to person, place, and time.  Psychiatric:        Mood and Affect: Mood normal.        Behavior: Behavior normal.  Thought Content: Thought content normal.    Assessment & Plan:  This visit occurred during the SARS-CoV-2 public health emergency.  Safety protocols were in place, including screening questions prior to the visit, additional usage of staff PPE, and extensive cleaning of exam room while observing appropriate contact time as indicated for disinfecting solutions.   Morganne was seen today for establish care.  Diagnoses and all orders for this visit:  Type 2 diabetes mellitus with diabetic mononeuropathy, without long-term current use of insulin (HCC) -     Microalbumin / creatinine urine ratio; Future -     Basic metabolic panel; Future -     Hemoglobin A1c;  Future  Primary hypertension -     Basic metabolic panel; Future  Stage 3b chronic kidney disease ([place]) -     Basic metabolic panel; Future  Mixed hyperlipidemia -     Hepatic function panel; Future -     Lipid panel; Future  Influenza vaccine needed -     Flu Vaccine QUAD High Dose(Fluad)    Problem List Items Addressed This Visit      Cardiovascular and Mediastinum   HTN (hypertension)    BP at goal with lisinopril BP Readings from Last 3 Encounters:  11/11/19 130/80  08/28/19 130/78  02/27/19 138/78   Maintain current medication Repeat BMP today      Relevant Orders   Basic metabolic panel (Completed)     Endocrine   Diabetes mellitus ([place]) - Primary    Controlled with diet Upcoming appt for annual eye exam Decreased vibration sensation in feet (R>L), advised about need for footwear at all time and proper foot care. table renal and liver function. Persistent CKD-stage 3 Normal lipid panel, but with your risk for cardiovascular disese at 53.7%, I recommend changing pravastatin to crestor. Are you ok with that? Controlled Dm with HgbA1c at 6.9. continue diet and exercise Abnormal urine microalbumin, continue lisinopril for BP and renal protection Maintain adequate oral hydration with water F/up in 31months      Relevant Orders   Microalbumin / creatinine urine ratio (Completed)   Basic metabolic panel (Completed)   Hemoglobin A1c (Completed)     Genitourinary   CKD (chronic kidney disease) stage 3, GFR 30-59 ml/min (HCC)    Stable renal function BP at goal with lisinopril. Controlled Dm with HgbA1c at 6.9. continue diet and exercise Abnormal urine microalbumin, continue lisinopril for BP and renal protection Maintain adequate oral hydration with water F/up in 28months       Relevant Orders   Basic metabolic panel (Completed)     Other   HLD (hyperlipidemia)    Previous lipid panel indicates LDL at goal but elevated triglyceride Current use of  pravastatin with no adverse side effects.  Normal lipid panel, but with your risk for cardiovascular disese at 53.7%, need to change pravastatin to crestor? Waiting for response from patient      Relevant Orders   Hepatic function panel (Completed)   Lipid panel (Completed)    Other Visit Diagnoses    Influenza vaccine needed       Relevant Orders   Flu Vaccine QUAD High Dose(Fluad) (Completed)      Follow-up: Return in about 6 months (around 05/11/2020) for DM and HTN, hyperlipidemia (fasting).  Wilfred Lacy, NP

## 2019-11-11 NOTE — Patient Instructions (Addendum)
Go to [address] for blood draw and urine collection. You will need to be fasting for 6-8hrs prior to blood draw Ok to drink water  Have eye exam reports faxed to me once completed.  Diabetes Mellitus and Foot Care Foot care is an important part of your health, especially when you have diabetes. Diabetes may cause you to have problems because of poor blood flow (circulation) to your feet and legs, which can cause your skin to:  Become thinner and drier.  Break more easily.  Heal more slowly.  Peel and crack. You may also have nerve damage (neuropathy) in your legs and feet, causing decreased feeling in them. This means that you may not notice minor injuries to your feet that could lead to more serious problems. Noticing and addressing any potential problems early is the best way to prevent future foot problems. How to care for your feet Foot hygiene  Wash your feet daily with warm water and mild soap. Do not use hot water. Then, pat your feet and the areas between your toes until they are completely dry. Do not soak your feet as this can dry your skin.  Trim your toenails straight across. Do not dig under them or around the cuticle. File the edges of your nails with an emery board or nail file.  Apply a moisturizing lotion or petroleum jelly to the skin on your feet and to dry, brittle toenails. Use lotion that does not contain alcohol and is unscented. Do not apply lotion between your toes. Shoes and socks  Wear clean socks or stockings every day. Make sure they are not too tight. Do not wear knee-high stockings since they may decrease blood flow to your legs.  Wear shoes that fit properly and have enough cushioning. Always look in your shoes before you put them on to be sure there are no objects inside.  To break in new shoes, wear them for just a few hours a day. This prevents injuries on your feet. Wounds, scrapes, corns, and calluses  Check your feet daily for blisters,  cuts, bruises, sores, and redness. If you cannot see the bottom of your feet, use a mirror or ask someone for help.  Do not cut corns or calluses or try to remove them with medicine.  If you find a minor scrape, cut, or break in the skin on your feet, keep it and the skin around it clean and dry. You may clean these areas with mild soap and water. Do not clean the area with peroxide, alcohol, or iodine.  If you have a wound, scrape, corn, or callus on your foot, look at it several times a day to make sure it is healing and not infected. Check for: ? Redness, swelling, or pain. ? Fluid or blood. ? Warmth. ? Pus or a bad smell. General instructions  Do not cross your legs. This may decrease blood flow to your feet.  Do not use heating pads or hot water bottles on your feet. They may burn your skin. If you have lost feeling in your feet or legs, you may not know this is happening until it is too late.  Protect your feet from hot and cold by wearing shoes, such as at the beach or on hot pavement.  Schedule a complete foot exam at least once a year (annually) or more often if you have foot problems. If you have foot problems, report any cuts, sores, or bruises to your health care  provider immediately. Contact a health care provider if:  You have a medical condition that increases your risk of infection and you have any cuts, sores, or bruises on your feet.  You have an injury that is not healing.  You have redness on your legs or feet.  You feel burning or tingling in your legs or feet.  You have pain or cramps in your legs and feet.  Your legs or feet are numb.  Your feet always feel cold.  You have pain around a toenail. Get help right away if:  You have a wound, scrape, corn, or callus on your foot and: ? You have pain, swelling, or redness that gets worse. ? You have fluid or blood coming from the wound, scrape, corn, or callus. ? Your wound, scrape, corn, or callus feels  warm to the touch. ? You have pus or a bad smell coming from the wound, scrape, corn, or callus. ? You have a fever. ? You have a red line going up your leg. Summary  Check your feet every day for cuts, sores, red spots, swelling, and blisters.  Moisturize feet and legs daily.  Wear shoes that fit properly and have enough cushioning.  If you have foot problems, report any cuts, sores, or bruises to your health care provider immediately.  Schedule a complete foot exam at least once a year (annually) or more often if you have foot problems. This information is not intended to replace advice given to you by your health care provider. Make sure you discuss any questions you have with your health care provider. Document Revised: 10/17/2018 Document Reviewed: 02/26/2016 Elsevier Patient Education  [place].

## 2019-11-11 NOTE — Assessment & Plan Note (Addendum)
Previous lipid panel indicates LDL at goal but elevated triglyceride Current use of pravastatin with no adverse side effects.  Normal lipid panel, but with your risk for cardiovascular disese at 53.7%, need to change pravastatin to crestor? Waiting for response from patient

## 2019-11-11 NOTE — Assessment & Plan Note (Signed)
BP at goal with lisinopril BP Readings from Last 3 Encounters:  11/11/19 130/80  08/28/19 130/78  02/27/19 138/78   Maintain current medication Repeat BMP today

## 2019-11-12 ENCOUNTER — Other Ambulatory Visit (INDEPENDENT_AMBULATORY_CARE_PROVIDER_SITE_OTHER): Payer: Medicare PPO

## 2019-11-12 DIAGNOSIS — N1832 Chronic kidney disease, stage 3b: Secondary | ICD-10-CM

## 2019-11-12 DIAGNOSIS — I10 Essential (primary) hypertension: Secondary | ICD-10-CM | POA: Diagnosis not present

## 2019-11-12 DIAGNOSIS — E1141 Type 2 diabetes mellitus with diabetic mononeuropathy: Secondary | ICD-10-CM | POA: Diagnosis not present

## 2019-11-12 DIAGNOSIS — E782 Mixed hyperlipidemia: Secondary | ICD-10-CM

## 2019-11-12 LAB — BASIC METABOLIC PANEL
BUN: 26 mg/dL — ABNORMAL HIGH (ref 6–23)
CO2: 23 mEq/L (ref 19–32)
Calcium: 9.2 mg/dL (ref 8.4–10.5)
Chloride: 106 mEq/L (ref 96–112)
Creatinine, Ser: 1.52 mg/dL — ABNORMAL HIGH (ref 0.40–1.20)
GFR: 32.18 mL/min — ABNORMAL LOW (ref >60.00)
Glucose, Bld: 147 mg/dL — ABNORMAL HIGH (ref 70–99)
Potassium: 4.5 mEq/L (ref 3.5–5.1)
Sodium: 139 mEq/L (ref 135–145)

## 2019-11-12 LAB — LIPID PANEL
Cholesterol: 149 mg/dL (ref 0–200)
HDL: 59.6 mg/dL (ref >39.00)
LDL Cholesterol: 68 mg/dL (ref 0–99)
NonHDL: 89.57
Total CHOL/HDL Ratio: 3
Triglycerides: 106 mg/dL (ref 0.0–149.0)
VLDL: 21.2 mg/dL (ref 0.0–40.0)

## 2019-11-12 LAB — HEPATIC FUNCTION PANEL
ALT: 8 U/L (ref 0–35)
AST: 9 U/L (ref 0–37)
Albumin: 3.8 g/dL (ref 3.5–5.2)
Alkaline Phosphatase: 81 U/L (ref 39–117)
Bilirubin, Direct: 0.1 mg/dL (ref 0.0–0.3)
Total Bilirubin: 0.3 mg/dL (ref 0.2–1.2)
Total Protein: 6.8 g/dL (ref 6.0–8.3)

## 2019-11-12 LAB — MICROALBUMIN / CREATININE URINE RATIO
Creatinine,U: 288.6 mg/dL
Microalb Creat Ratio: 4.4 mg/g (ref 0.0–30.0)
Microalb, Ur: 12.8 mg/dL — ABNORMAL HIGH (ref 0.0–1.9)

## 2019-11-12 LAB — HEMOGLOBIN A1C: Hgb A1c MFr Bld: 6.9 % — ABNORMAL HIGH (ref 4.6–6.5)

## 2019-11-12 NOTE — Assessment & Plan Note (Signed)
Stable renal function BP at goal with lisinopril. Controlled Dm with HgbA1c at 6.9. continue diet and exercise Abnormal urine microalbumin, continue lisinopril for BP and renal protection Maintain adequate oral hydration with water F/up in 70months

## 2019-11-21 ENCOUNTER — Other Ambulatory Visit: Payer: Self-pay

## 2019-11-21 DIAGNOSIS — I10 Essential (primary) hypertension: Secondary | ICD-10-CM

## 2019-11-21 MED ORDER — LISINOPRIL 20 MG PO TABS: ORAL_TABLET | ORAL | 3 refills | Status: DC

## 2019-11-21 NOTE — Telephone Encounter (Signed)
Pharmacy request for refill on Lisinopril Chart supports Rx.

## 2019-11-27 ENCOUNTER — Other Ambulatory Visit: Payer: Self-pay

## 2019-11-27 DIAGNOSIS — E782 Mixed hyperlipidemia: Secondary | ICD-10-CM

## 2019-11-27 MED ORDER — PRAVASTATIN SODIUM 10 MG PO TABS: 10.0000 mg | ORAL_TABLET | Freq: Every day | ORAL | 3 refills | Status: DC

## 2019-11-27 NOTE — Telephone Encounter (Signed)
[employer] refill request faxed over for Pravastatin 10 mg Chart supports Rx

## 2019-11-29 ENCOUNTER — Other Ambulatory Visit: Payer: Self-pay

## 2019-11-29 DIAGNOSIS — E782 Mixed hyperlipidemia: Secondary | ICD-10-CM

## 2019-11-29 NOTE — Progress Notes (Signed)
Made in error

## 2019-12-02 ENCOUNTER — Ambulatory Visit: Payer: Medicare PPO | Admitting: Urology

## 2019-12-03 ENCOUNTER — Telehealth: Payer: Self-pay | Admitting: Nurse Practitioner

## 2019-12-03 NOTE — Telephone Encounter (Signed)
Called patient to inform her medication was sent to pharmacy on 11/27/19 and pt states she checked her mail today and her medication was there.

## 2019-12-03 NOTE — Telephone Encounter (Signed)
Patient is calling and requesting a refill for Pravastatin be sent to [facility], please advise. CB is [phone]

## 2019-12-09 ENCOUNTER — Telehealth: Payer: Self-pay | Admitting: Nurse Practitioner

## 2019-12-09 NOTE — Telephone Encounter (Signed)
Left message for patient to schedule Annual Wellness Visit.  Please schedule with Nurse Health Advisor Martha Stanley, RN at [facility]  °

## 2020-01-01 ENCOUNTER — Ambulatory Visit (INDEPENDENT_AMBULATORY_CARE_PROVIDER_SITE_OTHER): Payer: Medicare PPO | Admitting: Urology

## 2020-01-01 ENCOUNTER — Encounter: Payer: Self-pay | Admitting: Urology

## 2020-01-01 ENCOUNTER — Other Ambulatory Visit: Payer: Self-pay

## 2020-01-01 DIAGNOSIS — C679 Malignant neoplasm of bladder, unspecified: Secondary | ICD-10-CM

## 2020-01-01 LAB — URINALYSIS, ROUTINE W REFLEX MICROSCOPIC
Bilirubin, UA: NEGATIVE
Glucose, UA: NEGATIVE
Leukocytes,UA: NEGATIVE
Nitrite, UA: NEGATIVE
Protein,UA: NEGATIVE
RBC, UA: NEGATIVE
Specific Gravity, UA: 1.02 (ref 1.005–1.030)
Urobilinogen, Ur: 0.2 mg/dL (ref 0.2–1.0)
pH, UA: 5.5 (ref 5.0–7.5)

## 2020-01-01 MED ORDER — CIPROFLOXACIN HCL 500 MG PO TABS
500.0000 mg | ORAL_TABLET | Freq: Once | ORAL | Status: AC
  Administered 2020-01-01: 500 mg via ORAL

## 2020-01-01 NOTE — Progress Notes (Signed)
° °  01/01/20  CC: Followup bladder cancer  HPI: Briana Ochoa is a 79yo here for followup for T1G3 bladder cancer diagnosed in 11/2017 with recurrance in 04/2018. She then underweny BCG therapy. No worsening LUTS. No hematuria There were no vitals taken for this visit. NED. A&Ox3.   No respiratory distress   Abd soft, NT, ND Normal external genitalia with patent urethral meatus  Cystoscopy Procedure Note  Patient identification was confirmed, informed consent was obtained, and patient was prepped using Betadine solution.  Lidocaine jelly was administered per urethral meatus.    Procedure: - Flexible cystoscope introduced, without any difficulty.   - Thorough search of the bladder revealed:    normal urethral meatus    normal urothelium    no stones    no ulcers     no tumors    no urethral polyps    no trabeculation  - Ureteral orifices were normal in position and appearance.  Post-Procedure: - Patient tolerated the procedure well  Assessment/ Plan:    Return in about 3 months (around 04/02/2020) for cystoscopy.  Nicolette Bang, MD

## 2020-01-01 NOTE — Progress Notes (Signed)
Urological Symptom Review  Patient is experiencing the following symptoms: Frequent urination Hard to postpone urination Get up at night to urinate Leakage of urine   Review of Systems  Gastrointestinal (upper)  : Negative for upper GI symptoms  Gastrointestinal (lower) : Negative for lower GI symptoms  Constitutional : Negative for symptoms  Skin: Negative for skin symptoms  Eyes: Negative for eye symptoms  Ear/Nose/Throat : Sinus problems  Hematologic/Lymphatic: Easy bruising  Cardiovascular : Negative for cardiovascular symptoms  Respiratory : Negative for respiratory symptoms  Endocrine: Negative for endocrine symptoms  Musculoskeletal: Joint pain  Neurological: Negative for neurological symptoms  Psychologic: Negative for psychiatric symptoms

## 2020-01-01 NOTE — Patient Instructions (Signed)
Bladder Cancer  Bladder cancer is an abnormal growth of tissue in the bladder. The bladder is the balloon-like sac in the pelvis. It collects and stores urine that comes from the kidneys through the ureters. The bladder wall is made of layers. If cancer spreads into these layers and through the wall of the bladder, it becomes more difficult to treat. What are the causes? The cause of this condition is not known. What increases the risk? The following factors may make you more likely to develop this condition:  Smoking.  Workplace risks (occupational exposures), such as rubber, leather, textile, dyes, chemicals, and paint.  Being white.  Your age. Most people with bladder cancer are over the age of 55.  Being female.  Having chronic bladder inflammation.  Having a personal history of bladder cancer.  Having a family history of bladder cancer (heredity).  Having had chemotherapy or radiation therapy to the pelvis.  Having been exposed to arsenic. What are the signs or symptoms? Initial symptoms of this condition include:  Blood in the urine.  Painful urination.  Frequent bladder or urine infections.  Increase in urgency and frequency of urination. Advanced symptoms of this condition include:  Not being able to urinate.  Low back pain on one side.  Loss of appetite.  Weight loss.  Fatigue.  Swelling in the feet.  Bone pain. How is this diagnosed? This condition is diagnosed based on your medical history, a physical exam, urine tests, lab tests, imaging tests, and your symptoms. You may also have other tests or procedures done, such as:  A narrow tube being inserted into your bladder through your urethra (cystoscopy) in order to view the lining of your bladder for tumors.  A biopsy to sample the tumor to see if cancer is present. If cancer is present, it will then be staged to determine its severity and extent. Staging is an assessment of:  The size of the  tumor.  Whether the cancer has spread.  Where the cancer has spread. It is important to know how deeply into the bladder wall cancer has grown and whether cancer has spread to any other parts of your body. Staging may require blood tests or imaging tests, such as a CT scan, MRI, bone scan, or chest X-ray. How is this treated? Based on the stage of cancer, one treatment or a combination of treatments may be recommended. The most common forms of treatment are:  Surgery to remove the cancer. Procedures that may be done include transurethral resection and cystectomy.  Radiation therapy. This is high-energy X-rays or other particles. This is often used in combination with chemotherapy.  Chemotherapy. During this treatment, medicines are used to kill cancer cells.  Immunotherapy. This uses medicines to help your own immune system destroy cancer cells. Follow these instructions at home:  Take over-the-counter and prescription medicines only as told by your health care provider.  Maintain a healthy diet. Some of your treatments might affect your appetite.  Consider joining a support group. This may help you learn to cope with the stress of having bladder cancer.  Tell your cancer care team if you develop side effects. They may be able to recommend ways to relieve them.  Keep all follow-up visits as told by your health care provider. This is important. Where to find more information  American Cancer Society: www.cancer.org  National Cancer Institute (NCI): www.cancer.gov Contact a health care provider if:  You have symptoms of a urinary tract infection. These include: ?   Fever. ? Chills. ? Weakness. ? Muscle aches. ? Abdominal pain. ? Frequent and intense urge to urinate. ? Burning feeling in the bladder or urethra during urination. Get help right away if:  There is blood in your urine.  You cannot urinate.  You have severe pain or other symptoms that do not go  away. Summary  Bladder cancer is an abnormal growth of tissue in the bladder.  This condition is diagnosed based on your medical history, a physical exam, urine tests, lab tests, imaging tests, and your symptoms.  Based on the stage of cancer, surgery, chemotherapy, or a combination of treatments may be recommended.  Consider joining a support group. This may help you learn to cope with the stress of having bladder cancer. This information is not intended to replace advice given to you by your health care provider. Make sure you discuss any questions you have with your health care provider. Document Revised: 01/06/2017 Document Reviewed: 12/29/2015 Elsevier Patient Education  2020 Elsevier Inc.  

## 2020-01-20 ENCOUNTER — Other Ambulatory Visit: Payer: Self-pay | Admitting: Hematology and Oncology

## 2020-01-20 ENCOUNTER — Ambulatory Visit
Admission: RE | Admit: 2020-01-20 | Discharge: 2020-01-20 | Disposition: A | Discharge status: Home / Self Care | Payer: Medicare PPO | Source: Ambulatory Visit | Attending: Hematology and Oncology | Admitting: Hematology and Oncology

## 2020-01-20 ENCOUNTER — Other Ambulatory Visit: Payer: Self-pay

## 2020-01-20 DIAGNOSIS — Z1231 Encounter for screening mammogram for malignant neoplasm of breast: Secondary | ICD-10-CM | POA: Diagnosis not present

## 2020-01-20 DIAGNOSIS — Z85841 Personal history of malignant neoplasm of brain: Secondary | ICD-10-CM

## 2020-02-18 ENCOUNTER — Telehealth: Payer: Self-pay | Admitting: Hematology and Oncology

## 2020-02-18 NOTE — Telephone Encounter (Signed)
Rescheduled 1/20 appt. Called pt and left a msg

## 2020-02-27 ENCOUNTER — Ambulatory Visit: Payer: Medicare PPO | Admitting: Hematology and Oncology

## 2020-03-04 NOTE — Assessment & Plan Note (Signed)
DCIS left breast ER 100%, P 100% diagnosis 08/20/2013 status post lumpectomy 09/19/2013; 2 foci measuring 0.2 and 0.3 cm, status post adjuvant radiation therapy, currently on Arimidex since October 2015 completed October 2020    Breast cancer surveillance: 1. Breast exam 03/05/2020 benign 2. Mammograms12/17/2021: Benign, breast density category B     Bladder cancer ([place]) Transurethral resection of the bladder tumor 11/07/2017: Infiltrative high-grade papillary urothelial carcinoma involves lamina propria at the level below muscularis mucosa. Muscularis propria is present and not involved  12/14/2017: TURBT: No evidence of malignancy Under the care of urology  RTC 1 yr

## 2020-03-04 NOTE — Progress Notes (Signed)
Patient Care Team: Nche, [name], NP as PCP - General (Internal Medicine) Fanny Skates, MD as Consulting Physician (General Surgery) Thea Silversmith, MD as Consulting Physician (Radiation Oncology) Nicholas Lose, MD as Consulting Physician (Hematology and Oncology) Garvin Fila, MD as Consulting Physician (Neurology) Consuella Lose, MD as Consulting Physician (Neurosurgery) Katy Apo, MD as Consulting Physician (Ophthalmology)  DIAGNOSIS:    ICD-10-CM   1. Ductal carcinoma in situ (DCIS) of left breast  D05.12     SUMMARY OF ONCOLOGIC HISTORY: Oncology History  Ductal carcinoma in situ (DCIS) of left breast  08/20/2013 Initial Diagnosis   Left breast 12:00: DCIS with calcifications, yet 100%, PR 100%; 09/04/2013 second group of calcifications also biopsied proven to be DCIS ER/PR positive   09/19/2013 Surgery   Left breast lumpectomy DCIS 2 foci margins negative, 0.2 and 0.3 cm ER 100% PR 100%   10/08/2013 - 11/12/2013 Radiation Therapy   Adjuvant radiation therapy   11/12/2013 -  Anti-estrogen oral therapy   Anastrozole 1 mg daily plan is for 5 years   01/09/2014 - 01/10/2014 Hospital Admission   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 with above-mentioned history of left breast cancer treated with lumpectomy, radiation, and who is currently on antiestrogen therapy with anastrozole. She also has a history of bladder cancer. Mammogram on 02/03/20 showed no evidence of malignancy bilaterally. She presents to the clinic today for annual follow-up.    ALLERGIES:  has No Known Allergies.  MEDICATIONS:  Current Outpatient Medications  Medication Sig Dispense Refill   acetaminophen (TYLENOL) 325 MG tablet Take 1,000 mg by mouth every morning.      aspirin EC 81 MG tablet Take 81  mg by mouth every morning.      glucose blood (TRUE METRIX BLOOD GLUCOSE TEST) test strip Use as instructed to test blood sugar once daily E08.638 100 each 2   lisinopril (ZESTRIL) 20 MG tablet TAKE 1 TABLET EVERY DAY 90 tablet 3   pravastatin (PRAVACHOL) 10 MG tablet Take 1 tablet (10 mg total) by mouth daily. 90 tablet 3   No current facility-administered medications for this visit.    PHYSICAL EXAMINATION: ECOG PERFORMANCE STATUS: 1 - Symptomatic but completely ambulatory  Vitals:   03/05/20 1502  BP: 114/62  Pulse: 96  Resp: 17  Temp: 97.7 F (36.5 C)  SpO2: 100%   Filed Weights   03/05/20 1502  Weight: 178 lb 4.8 oz (80.9 kg)    BREAST: No palpable masses or nodules in either right or left breasts. No palpable axillary supraclavicular or infraclavicular adenopathy no breast tenderness or nipple discharge. (exam performed in the presence of a chaperone)  LABORATORY DATA:  I have reviewed the data as listed CMP Latest Ref Rng & Units 11/12/2019 11/19/2018 04/28/2018  Glucose 70 - 99 mg/dL 147(H) 157(H) 139(H)  BUN 6 - 23 mg/dL 26(H) 22 23  Creatinine 0.40 - 1.20 mg/dL 1.52(H) 1.47(H) 1.40(H)  Sodium 135 - 145 mEq/L 139 138 139  Potassium 3.5 - 5.1 mEq/L 4.5 4.8 4.7  Chloride 96 - 112 mEq/L 106 103 108  CO2 19 - 32 mEq/L 23 26 25   Calcium 8.4 - 10.5 mg/dL 9.2 9.8 9.0  Total Protein 6.0 - 8.3 g/dL 6.8 6.8 -  Total Bilirubin 0.2 - 1.2 mg/dL 0.3  0.3 -  Alkaline Phos 39 - 117 U/L 81 83 -  AST 0 - 37 U/L 9 15 -  ALT 0 - 35 U/L 8 10 -    Lab Results  Component Value Date   WBC 11.0 (H) 11/19/2018   HGB 13.9 11/19/2018   HCT 42.9 11/19/2018   MCV 91.3 11/19/2018   PLT 350.0 11/19/2018   NEUTROABS 7.7 11/19/2018    ASSESSMENT & PLAN:  Ductal carcinoma in situ (DCIS) of left breast DCIS left breast ER 100%, P 100% diagnosis 08/20/2013 status post lumpectomy 09/19/2013; 2 foci measuring 0.2 and 0.3 cm, status post adjuvant radiation therapy, currently on Arimidex  since October 2015 completed October 2020    Breast cancer surveillance: 1. Breast exam 03/05/2020 benign 2. Mammograms12/17/2021: Benign, breast density category B     Bladder cancer ([place]) Transurethral resection of the bladder tumor 11/07/2017: Infiltrative high-grade papillary urothelial carcinoma involves lamina propria at the level below muscularis mucosa. Muscularis propria is present and not involved  12/14/2017: TURBT: No evidence of malignancy Under the care of urology  RTC on an as-needed basis     No orders of the defined types were placed in this encounter.  The patient has a good understanding of the overall plan. she agrees with it. she will call with any problems that may develop before the next visit here.  Total time spent: 20 mins including face to face time and time spent for planning, charting and coordination of care  Nicholas Lose, MD 03/05/2020  I, Cloyde Reams Dorshimer, am acting as scribe for Dr. Nicholas Lose.  I have reviewed the above documentation for accuracy and completeness, and I agree with the above.   Lobular Mepilex cetuximab Alvy Bimler

## 2020-03-05 ENCOUNTER — Inpatient Hospital Stay: Payer: Medicare PPO | Attending: Hematology and Oncology | Admitting: Hematology and Oncology

## 2020-03-05 ENCOUNTER — Other Ambulatory Visit: Payer: Self-pay

## 2020-03-05 DIAGNOSIS — C679 Malignant neoplasm of bladder, unspecified: Secondary | ICD-10-CM | POA: Insufficient documentation

## 2020-03-05 DIAGNOSIS — Z7982 Long term (current) use of aspirin: Secondary | ICD-10-CM | POA: Insufficient documentation

## 2020-03-05 DIAGNOSIS — Z923 Personal history of irradiation: Secondary | ICD-10-CM | POA: Diagnosis not present

## 2020-03-05 DIAGNOSIS — Z79811 Long term (current) use of aromatase inhibitors: Secondary | ICD-10-CM | POA: Diagnosis not present

## 2020-03-05 DIAGNOSIS — D0512 Intraductal carcinoma in situ of left breast: Secondary | ICD-10-CM | POA: Insufficient documentation

## 2020-03-05 DIAGNOSIS — Z79899 Other long term (current) drug therapy: Secondary | ICD-10-CM | POA: Diagnosis not present

## 2020-03-05 DIAGNOSIS — Z170 Estrogen receptor positive status [ER+]: Secondary | ICD-10-CM | POA: Diagnosis not present

## 2020-04-01 ENCOUNTER — Other Ambulatory Visit: Payer: Medicare PPO | Admitting: Urology

## 2020-04-02 ENCOUNTER — Telehealth: Payer: Self-pay | Admitting: Nurse Practitioner

## 2020-04-02 NOTE — Telephone Encounter (Signed)
Left detailed message to call the office regarding symptoms. Per Baldo Ash, if she's still having symptoms, she needs to go to Urgent Care and be evaluated.

## 2020-04-03 DIAGNOSIS — H6121 Impacted cerumen, right ear: Secondary | ICD-10-CM | POA: Diagnosis not present

## 2020-04-03 DIAGNOSIS — R9389 Abnormal findings on diagnostic imaging of other specified body structures: Secondary | ICD-10-CM | POA: Diagnosis not present

## 2020-04-03 DIAGNOSIS — R0609 Other forms of dyspnea: Secondary | ICD-10-CM | POA: Diagnosis not present

## 2020-04-03 DIAGNOSIS — R42 Dizziness and giddiness: Secondary | ICD-10-CM | POA: Diagnosis not present

## 2020-04-03 NOTE — Telephone Encounter (Signed)
Pt returned call and agreed to speak with Triage nurse, transferred to [facility] care [place].

## 2020-04-07 ENCOUNTER — Encounter: Payer: Self-pay | Admitting: Nurse Practitioner

## 2020-04-07 ENCOUNTER — Other Ambulatory Visit: Payer: Self-pay

## 2020-04-07 ENCOUNTER — Ambulatory Visit: Payer: Medicare PPO | Admitting: Nurse Practitioner

## 2020-04-07 VITALS — BP 110/60 | HR 85 | Temp 97.9°F | Ht 64.0 in | Wt 179.4 lb

## 2020-04-07 DIAGNOSIS — N1832 Chronic kidney disease, stage 3b: Secondary | ICD-10-CM | POA: Diagnosis not present

## 2020-04-07 DIAGNOSIS — R7989 Other specified abnormal findings of blood chemistry: Secondary | ICD-10-CM | POA: Diagnosis not present

## 2020-04-07 DIAGNOSIS — R000 Tachycardia, unspecified: Secondary | ICD-10-CM

## 2020-04-07 DIAGNOSIS — R0609 Other forms of dyspnea: Secondary | ICD-10-CM

## 2020-04-07 DIAGNOSIS — R0600 Dyspnea, unspecified: Secondary | ICD-10-CM | POA: Diagnosis not present

## 2020-04-07 NOTE — Telephone Encounter (Signed)
Called [facility] and spoke with Apolonio Schneiders, pt did speak to the triage nurse on 04/03/2020 - Apolonio Schneiders having to request her supervisor to resend to the office. I pulled the [employer] and the report was not present. Apolonio Schneiders did note pt was advised to proceed to ER but there was more information prior to her computer shutting down unexpectedly.

## 2020-04-07 NOTE — Telephone Encounter (Signed)
Received copy of triage report - sending to scan  Pt was advised to go to ER due to sob x 3 months and getting worse, light headedness when bending over and standing, pressure in hea 2-3 wks, fatigue & muscles weak.  Pt agreed to go to ER but nothing noted by [facility] or Care Everywhere for ER visit.  [place] aware.   Pt has not returned calls to change to VV or prescreen.

## 2020-04-07 NOTE — Patient Instructions (Signed)
Go to lab for blood draw and CXR.  You will be contacted to schedule appt with cardiology.  Go to ED if your symptoms worsen.  Shortness of Breath, Adult Shortness of breath means you have trouble breathing. Shortness of breath could be a sign of a medical problem. Follow these instructions at home:  Watch for any changes in your symptoms.  Do not use any products that contain nicotine or tobacco, such as cigarettes, e-cigarettes, and chewing tobacco.  Do not smoke. Smoking can cause shortness of breath. If you need help to quit smoking, ask your doctor.  Avoid things that can make it harder to breathe, such as: ? Mold. ? Dust. ? Air pollution. ? Chemical smells. ? Things that can cause allergy symptoms (allergens), if you have allergies.  Keep your living space clean. Use products that help remove mold and dust.  Rest as needed. Slowly return to your normal activities.  Take over-the-counter and prescription medicines only as told by your doctor. This includes oxygen therapy and inhaled medicines.  Keep all follow-up visits as told by your doctor. This is important.   Contact a doctor if:  Your condition does not get better as soon as expected.  You have a hard time doing your normal activities, even after you rest.  You have new symptoms. Get help right away if:  Your shortness of breath gets worse.  You have trouble breathing when you are resting.  You feel light-headed or you pass out (faint).  You have a cough that is not helped by medicines.  You cough up blood.  You have pain with breathing.  You have pain in your chest, arms, shoulders, or belly (abdomen).  You have a fever.  You cannot walk up stairs.  You cannot exercise the way you normally do. These symptoms may represent a serious problem that is an emergency. Do not wait to see if the symptoms will go away. Get medical help right away. Call your local emergency services (911 in the [place] Do not  drive yourself to the hospital. Summary  Shortness of breath is when you have trouble breathing enough air. It can be a sign of a medical problem.  Avoid things that make it hard for you to breathe, such as smoking, pollution, mold, and dust.  Watch for any changes in your symptoms. Contact your doctor if you do not get better or you get worse. This information is not intended to replace advice given to you by your health care provider. Make sure you discuss any questions you have with your health care provider. Document Revised: 06/26/2017 Document Reviewed: 06/26/2017 Elsevier Patient Education  2021 [employer].

## 2020-04-07 NOTE — Progress Notes (Signed)
Subjective:  Patient ID: Briana Ochoa, female    DOB: March 04, 1940  Age: 80 y.o. MRN: [id number]  CC: Acute Visit (Pt c/o SOB, fatigue, and dizziness that comes and goes x 3 months, but it has been worse in the past 3-4 weeks. Pt states it is starting to taking less to make her SOB. Dizziness only comes when bending, standing, or turning to quickly. )  Shortness of Breath This is a new problem. The current episode started more than 1 month ago. The problem occurs constantly. The problem has been gradually worsening. Pertinent negatives include no abdominal pain, chest pain, fever, headaches, hemoptysis, leg pain, leg swelling, neck pain, orthopnea, PND, rash, rhinorrhea, sore throat, sputum production, swollen glands, syncope, vomiting or wheezing. The symptoms are aggravated by any activity. The patient has no known risk factors for DVT/PE. She has tried beta agonist inhalers for the symptoms. The treatment provided no relief.  per patient: she was evaluated at urgent care clinic on Saturday, normal CXR per patient. Albuterol prescribed  BP Readings from Last 3 Encounters:  04/07/20 110/60  03/05/20 114/62  11/11/19 130/80   Wt Readings from Last 3 Encounters:  04/07/20 179 lb 6.4 oz (81.4 kg)  03/05/20 178 lb 4.8 oz (80.9 kg)  11/11/19 182 lb 4.8 oz (82.7 kg)   Reviewed past Medical, Social and Family history today.  Outpatient Medications Prior to Visit  Medication Sig Dispense Refill  . acetaminophen (TYLENOL) 325 MG tablet Take 1,000 mg by mouth every morning.    [name] lisinopril (ZESTRIL) 20 MG tablet TAKE 1 TABLET EVERY DAY 90 tablet 3  . pravastatin (PRAVACHOL) 10 MG tablet Take 1 tablet (10 mg total) by mouth daily. 90 tablet 3  . aspirin EC 81 MG tablet Take 81 mg by mouth every morning.  (Patient not taking: Reported on 04/07/2020)    . glucose blood (TRUE METRIX BLOOD GLUCOSE TEST) test strip Use as instructed to test blood sugar once daily E08.638 (Patient not taking:  Reported on 04/07/2020) 100 each 2   No facility-administered medications prior to visit.    ROS See HPI  Objective:  BP 110/60 (BP Location: Left Arm, Patient Position: Sitting, Cuff Size: Large)   Pulse 85   Temp 97.9 F (36.6 C) (Temporal)   Ht 5\' 4"  (1.626 m)   Wt 179 lb 6.4 oz (81.4 kg)   SpO2 98%   BMI 30.79 kg/m   Walking for 47mins: HR 122, O2 Sat. 91% RA, symptoms: chest tightness and SOB. Symptoms resolved at rest: O2 Sat 99%RA, HR 89  Physical Exam Constitutional:      General: She is not in acute distress. Cardiovascular:     Rate and Rhythm: Normal rate and regular rhythm.     Pulses: Normal pulses.     Heart sounds: Murmur heard.  No gallop.   Pulmonary:     Effort: Pulmonary effort is normal. No respiratory distress.     Breath sounds: Normal breath sounds.  Musculoskeletal:     Right lower leg: No edema.     Left lower leg: No edema.  Skin:    General: Skin is warm and dry.  Neurological:     Mental Status: She is alert and oriented to person, place, and time.    Assessment & Plan:  This visit occurred during the SARS-CoV-2 public health emergency.  Safety protocols were in place, including screening questions prior to the visit, additional usage of staff PPE, and extensive cleaning of exam  room while observing appropriate contact time as indicated for disinfecting solutions.   Briana Ochoa was seen today for acute visit.  Diagnoses and all orders for this visit:  DOE (dyspnea on exertion) -     Ambulatory referral to Cardiology -     D-Dimer, Quantitative -     Cancel: DG Chest 2 View -     B Nat Peptide -     CBC with Differential/Platelet -     DG Chest 2 View; Future  Stage 3b chronic kidney disease ([place]) -     Basic metabolic panel  Tachycardia -     Ambulatory referral to Cardiology -     CBC with Differential/Platelet -     Basic metabolic panel  she was given ED precautions. She is to sign medical release form to get records from urgent  care clinic.  With positive D Dimer: Briana Ochoa was notified via telephone. She was advised to go to the ED for additional evaluation and treatment. She verbalized understanding and agreed to go to [facility] ED this morning.  Problem List Items Addressed This Visit      Genitourinary   CKD (chronic kidney disease) stage 3, GFR 30-59 ml/min (HCC)   Relevant Orders   Basic metabolic panel     Other   DOE (dyspnea on exertion) - Primary   Relevant Orders   Ambulatory referral to Cardiology   D-Dimer, Quantitative   B Nat Peptide   CBC with Differential/Platelet   DG Chest 2 View    Other Visit Diagnoses    Tachycardia       Relevant Orders   Ambulatory referral to Cardiology   CBC with Differential/Platelet   Basic metabolic panel      Follow-up: No follow-ups on file.  Wilfred Lacy, NP

## 2020-04-08 ENCOUNTER — Other Ambulatory Visit: Payer: Self-pay

## 2020-04-08 ENCOUNTER — Observation Stay (HOSPITAL_COMMUNITY)
Admission: EM | Admit: 2020-04-08 | Discharge: 2020-04-09 | Disposition: A | Discharge status: Home / Self Care | Payer: Medicare PPO | Attending: Family Medicine | Admitting: Family Medicine

## 2020-04-08 ENCOUNTER — Emergency Department (HOSPITAL_COMMUNITY): Payer: Medicare PPO

## 2020-04-08 ENCOUNTER — Encounter (HOSPITAL_COMMUNITY): Payer: Self-pay | Admitting: Emergency Medicine

## 2020-04-08 DIAGNOSIS — R0609 Other forms of dyspnea: Secondary | ICD-10-CM | POA: Diagnosis present

## 2020-04-08 DIAGNOSIS — Z8551 Personal history of malignant neoplasm of bladder: Secondary | ICD-10-CM | POA: Diagnosis not present

## 2020-04-08 DIAGNOSIS — Z85820 Personal history of malignant melanoma of skin: Secondary | ICD-10-CM | POA: Diagnosis not present

## 2020-04-08 DIAGNOSIS — Z7982 Long term (current) use of aspirin: Secondary | ICD-10-CM | POA: Insufficient documentation

## 2020-04-08 DIAGNOSIS — D508 Other iron deficiency anemias: Secondary | ICD-10-CM | POA: Diagnosis not present

## 2020-04-08 DIAGNOSIS — Z853 Personal history of malignant neoplasm of breast: Secondary | ICD-10-CM | POA: Insufficient documentation

## 2020-04-08 DIAGNOSIS — Z20822 Contact with and (suspected) exposure to covid-19: Secondary | ICD-10-CM | POA: Diagnosis not present

## 2020-04-08 DIAGNOSIS — Z87891 Personal history of nicotine dependence: Secondary | ICD-10-CM | POA: Diagnosis not present

## 2020-04-08 DIAGNOSIS — E119 Type 2 diabetes mellitus without complications: Secondary | ICD-10-CM | POA: Diagnosis not present

## 2020-04-08 DIAGNOSIS — R7989 Other specified abnormal findings of blood chemistry: Secondary | ICD-10-CM | POA: Diagnosis not present

## 2020-04-08 DIAGNOSIS — E1169 Type 2 diabetes mellitus with other specified complication: Secondary | ICD-10-CM

## 2020-04-08 DIAGNOSIS — Z79899 Other long term (current) drug therapy: Secondary | ICD-10-CM | POA: Insufficient documentation

## 2020-04-08 DIAGNOSIS — D649 Anemia, unspecified: Secondary | ICD-10-CM | POA: Diagnosis not present

## 2020-04-08 DIAGNOSIS — N1832 Chronic kidney disease, stage 3b: Secondary | ICD-10-CM | POA: Diagnosis not present

## 2020-04-08 DIAGNOSIS — R0602 Shortness of breath: Secondary | ICD-10-CM | POA: Diagnosis not present

## 2020-04-08 DIAGNOSIS — N183 Chronic kidney disease, stage 3 unspecified: Secondary | ICD-10-CM | POA: Diagnosis present

## 2020-04-08 DIAGNOSIS — D500 Iron deficiency anemia secondary to blood loss (chronic): Secondary | ICD-10-CM

## 2020-04-08 DIAGNOSIS — I129 Hypertensive chronic kidney disease with stage 1 through stage 4 chronic kidney disease, or unspecified chronic kidney disease: Secondary | ICD-10-CM | POA: Insufficient documentation

## 2020-04-08 DIAGNOSIS — D75839 Thrombocytosis, unspecified: Secondary | ICD-10-CM | POA: Insufficient documentation

## 2020-04-08 DIAGNOSIS — R195 Other fecal abnormalities: Secondary | ICD-10-CM | POA: Diagnosis not present

## 2020-04-08 LAB — CBC WITH DIFFERENTIAL/PLATELET
Abs Immature Granulocytes: 0.05 10*3/uL (ref 0.00–0.07)
Basophils Absolute: 0.3 10*3/uL — ABNORMAL HIGH (ref 0.0–0.1)
Basophils Absolute: 0.1 10*3/uL (ref 0.0–0.1)
Basophils Relative: 2.7 % (ref 0.0–3.0)
Basophils Relative: 1 %
Eosinophils Absolute: 0.7 10*3/uL (ref 0.0–0.7)
Eosinophils Absolute: 0.7 10*3/uL — ABNORMAL HIGH (ref 0.0–0.5)
Eosinophils Relative: 6.2 % — ABNORMAL HIGH (ref 0.0–5.0)
Eosinophils Relative: 5 %
HCT: 23.8 % — CL (ref 36.0–46.0)
HCT: 25 % — ABNORMAL LOW (ref 36.0–46.0)
Hemoglobin: 7.2 g/dL — CL (ref 12.0–15.0)
Hemoglobin: 7 g/dL — ABNORMAL LOW (ref 12.0–15.0)
Immature Granulocytes: 0 %
Lymphocytes Relative: 16.6 % (ref 12.0–46.0)
Lymphocytes Relative: 11 %
Lymphs Abs: 1.9 10*3/uL (ref 0.7–4.0)
Lymphs Abs: 1.5 10*3/uL (ref 0.7–4.0)
MCH: 21.1 pg — ABNORMAL LOW (ref 26.0–34.0)
MCHC: 30.3 g/dL (ref 30.0–36.0)
MCHC: 28 g/dL — ABNORMAL LOW (ref 30.0–36.0)
MCV: 71.9 fl — ABNORMAL LOW (ref 78.0–100.0)
MCV: 75.3 fL — ABNORMAL LOW (ref 80.0–100.0)
Monocytes Absolute: 1.1 10*3/uL — ABNORMAL HIGH (ref 0.1–1.0)
Monocytes Absolute: 1.2 10*3/uL — ABNORMAL HIGH (ref 0.1–1.0)
Monocytes Relative: 9.6 % (ref 3.0–12.0)
Monocytes Relative: 9 %
Neutro Abs: 7.6 10*3/uL (ref 1.4–7.7)
Neutro Abs: 9.9 10*3/uL — ABNORMAL HIGH (ref 1.7–7.7)
Neutrophils Relative %: 64.9 % (ref 43.0–77.0)
Neutrophils Relative %: 74 %
Platelets: 786 10*3/uL — ABNORMAL HIGH (ref 150.0–400.0)
Platelets: 783 10*3/uL — ABNORMAL HIGH (ref 150–400)
RBC: 3.31 Mil/uL — ABNORMAL LOW (ref 3.87–5.11)
RBC: 3.32 MIL/uL — ABNORMAL LOW (ref 3.87–5.11)
RDW: 18.6 % — ABNORMAL HIGH (ref 11.5–15.5)
RDW: 18.3 % — ABNORMAL HIGH (ref 11.5–15.5)
WBC: 11.7 10*3/uL — ABNORMAL HIGH (ref 4.0–10.5)
WBC: 13.5 10*3/uL — ABNORMAL HIGH (ref 4.0–10.5)
nRBC: 0.1 % (ref 0.0–0.2)

## 2020-04-08 LAB — IRON AND TIBC
Iron: 10 ug/dL — ABNORMAL LOW (ref 28–170)
Saturation Ratios: 2 % — ABNORMAL LOW (ref 10.4–31.8)
TIBC: 487 ug/dL — ABNORMAL HIGH (ref 250–450)
UIBC: 477 ug/dL

## 2020-04-08 LAB — COMPREHENSIVE METABOLIC PANEL
ALT: 9 U/L (ref 0–44)
AST: 12 U/L — ABNORMAL LOW (ref 15–41)
Albumin: 3.4 g/dL — ABNORMAL LOW (ref 3.5–5.0)
Alkaline Phosphatase: 80 U/L (ref 38–126)
Anion gap: 9 (ref 5–15)
BUN: 31 mg/dL — ABNORMAL HIGH (ref 8–23)
CO2: 21 mmol/L — ABNORMAL LOW (ref 22–32)
Calcium: 9.1 mg/dL (ref 8.9–10.3)
Chloride: 107 mmol/L (ref 98–111)
Creatinine, Ser: 1.65 mg/dL — ABNORMAL HIGH (ref 0.44–1.00)
GFR, Estimated: 31 mL/min — ABNORMAL LOW (ref >60)
Glucose, Bld: 140 mg/dL — ABNORMAL HIGH (ref 70–99)
Potassium: 4.8 mmol/L (ref 3.5–5.1)
Sodium: 137 mmol/L (ref 135–145)
Total Bilirubin: 0.5 mg/dL (ref 0.3–1.2)
Total Protein: 7.1 g/dL (ref 6.5–8.1)

## 2020-04-08 LAB — BASIC METABOLIC PANEL
BUN: 30 mg/dL — ABNORMAL HIGH (ref 6–23)
CO2: 24 mEq/L (ref 19–32)
Calcium: 9.3 mg/dL (ref 8.4–10.5)
Chloride: 104 mEq/L (ref 96–112)
Creatinine, Ser: 1.46 mg/dL — ABNORMAL HIGH (ref 0.40–1.20)
GFR: 33.95 mL/min — ABNORMAL LOW (ref >60.00)
Glucose, Bld: 96 mg/dL (ref 70–99)
Potassium: 5.3 mEq/L — ABNORMAL HIGH (ref 3.5–5.1)
Sodium: 136 mEq/L (ref 135–145)

## 2020-04-08 LAB — D-DIMER, QUANTITATIVE: D-Dimer, Quant: 0.79 mcg/mL FEU — ABNORMAL HIGH (ref <0.50)

## 2020-04-08 LAB — BRAIN NATRIURETIC PEPTIDE: B Natriuretic Peptide: 70.6 pg/mL (ref 0.0–100.0)

## 2020-04-08 LAB — PROTIME-INR
INR: 1 (ref 0.8–1.2)
Prothrombin Time: 13.2 seconds (ref 11.4–15.2)

## 2020-04-08 LAB — LACTATE DEHYDROGENASE: LDH: 107 U/L (ref 98–192)

## 2020-04-08 LAB — TROPONIN I (HIGH SENSITIVITY): Troponin I (High Sensitivity): 4 ng/L (ref <18)

## 2020-04-08 LAB — HEMOGLOBIN AND HEMATOCRIT, BLOOD
HCT: 28.9 % — ABNORMAL LOW (ref 36.0–46.0)
Hemoglobin: 8.5 g/dL — ABNORMAL LOW (ref 12.0–15.0)

## 2020-04-08 LAB — FERRITIN: Ferritin: 6 ng/mL — ABNORMAL LOW (ref 11–307)

## 2020-04-08 LAB — GLUCOSE, CAPILLARY: Glucose-Capillary: 125 mg/dL — ABNORMAL HIGH (ref 70–99)

## 2020-04-08 LAB — PREPARE RBC (CROSSMATCH)

## 2020-04-08 LAB — POC OCCULT BLOOD, ED: Fecal Occult Bld: POSITIVE — AB

## 2020-04-08 MED ORDER — ONDANSETRON HCL 4 MG/2ML IJ SOLN: 4.0000 mg | Freq: Four times a day (QID) | INTRAMUSCULAR | Status: DC | PRN

## 2020-04-08 MED ORDER — INSULIN ASPART 100 UNIT/ML ~~LOC~~ SOLN
0.0000 [IU] | Freq: Three times a day (TID) | SUBCUTANEOUS | Status: DC
  Administered 2020-04-09: 1 [IU] via SUBCUTANEOUS

## 2020-04-08 MED ORDER — LISINOPRIL 20 MG PO TABS
20.0000 mg | ORAL_TABLET | Freq: Every day | ORAL | Status: DC
  Administered 2020-04-09: 20 mg via ORAL
  Filled 2020-04-08: qty 1

## 2020-04-08 MED ORDER — ACETAMINOPHEN 325 MG PO TABS
650.0000 mg | ORAL_TABLET | Freq: Four times a day (QID) | ORAL | Status: DC | PRN
  Administered 2020-04-09: 650 mg via ORAL
  Filled 2020-04-08: qty 2

## 2020-04-08 MED ORDER — ONDANSETRON HCL 4 MG PO TABS: 4.0000 mg | ORAL_TABLET | Freq: Four times a day (QID) | ORAL | Status: DC | PRN

## 2020-04-08 MED ORDER — SODIUM CHLORIDE 0.9% IV SOLUTION: Freq: Once | INTRAVENOUS | Status: DC

## 2020-04-08 MED ORDER — PRAVASTATIN SODIUM 20 MG PO TABS
10.0000 mg | ORAL_TABLET | Freq: Every day | ORAL | Status: DC
  Administered 2020-04-09: 10 mg via ORAL
  Filled 2020-04-08: qty 1

## 2020-04-08 MED ORDER — SODIUM CHLORIDE 0.9 % IV SOLN
80.0000 mg | Freq: Once | INTRAVENOUS | Status: AC
  Administered 2020-04-08: 80 mg via INTRAVENOUS
  Filled 2020-04-08: qty 80

## 2020-04-08 MED ORDER — ACETAMINOPHEN 650 MG RE SUPP: 650.0000 mg | Freq: Four times a day (QID) | RECTAL | Status: DC | PRN

## 2020-04-08 MED ORDER — SODIUM CHLORIDE 0.9 % IV SOLN
8.0000 mg/h | INTRAVENOUS | Status: DC
  Administered 2020-04-08: 8 mg/h via INTRAVENOUS
  Filled 2020-04-08: qty 80

## 2020-04-08 NOTE — ED Triage Notes (Signed)
Pt presented to ED by POV with c/o SOB. Pt reports she went to the doctor yesterday and they ran a test to determine possible blood clot and it was positive. Pt also reports SOB ( feels like she ran a mile after walking a short distance and "pounding in her heart"

## 2020-04-08 NOTE — H&P (Signed)
History and Physical    Briana Ochoa UEK:[id number] DOB: 07/01/1940 DOA: 04/08/2020  PCP: Briana Buffy, NP  Patient coming from: Home  Chief Complaint: dyspnea on exertion, fatigue.  HPI: Briana Ochoa is a 80 y.o. female with medical history significant of CKD3b, HTN, DM2, HLD. Presenting with DOE and fatigue. She reports that it started about a month ago. It's difficult to walk more than 10 feet without dyspnea. She takes a 5-minute rest to recover when she becomes dyspneic. She has no chest pain, palpitations, fever or edema during this time. She didn't try any medicines to help. She endured these symptoms for a month and decided to see her PCP about it. Yesterday labs were drawn. She was notified this morning that she had an elevated d-dimer and that she should come to the ED for evaluation. She denies any other aggravating or alleviating factors.    ED Course: CXR was negative. She was found to be anemic. 1 unit pRBCs were ordered. TRH was called for admission.    Review of Systems:  She denies hematochezia, hematemesis. Denies CP, syncopal episodes. Denies N/V/D. Review of systems is otherwise negative for all not mentioned in HPI.   PMHx Past Medical History:  Diagnosis Date  . Aortic atherosclerosis ([place])   . Bladder cancer ([place])   . Bladder tumor   . Bloody diarrhea 09/29/2017  . Breast cancer ([place]) 2015   Left Breast Cancer  . CKD (chronic kidney disease), stage III ([place])   . Diabetes mellitus type 2, diet-controlled ([place])   . Dyspnea 09/28/2017  . Dysuria   . Fatigue 09/28/2017  . Frequency of urination   . Grade I diastolic dysfunction 15/06/6977   Noted on ECHO  . Gross hematuria 09/29/2017  . Heart murmur    since rheumatic fever as child  . Hematuria   . History of cardiomegaly 02/12/2005   Mild, noted on CXR  . History of CVA (cerebrovascular accident) 08/05/2014   post op cerebral angiogram, cerebral thrombosis w/ cerebral infarction;   11-02-2017  per pt no residuals  . History of malignant melanoma of skin 12/2015   s/p wide local excision right lower leg (per pt localized)  . History of rheumatic fever as a child   . Hypertension   . Intracranial aneurysm dx 07/ 2015   paraophthalmic RICA aneurysm /   s/p  Pipeline embolization right ICA 01-09-2014  . Jaundice 09/28/2017  . Malignant neoplasm of central portion of left breast in female, estrogen receptor positive [facility]) oncologist-  dr Lindi Adie   dx 07/ 2015--- DCIS, ER/PR positive-- s/p  left breast lumpectomy 09-19-2013,  completed radiation 11-12-2013,  started arimidex 11-12-2013  . OA (osteoarthritis)    knees, hands,  shoulder  . Osteopenia 10/16/2012   Diffuse  . Personal history of radiation therapy   . 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 Sigmoid diverticulosis   . Weakness 09/28/2017  . Wears dentures    upper  . Wears glasses   . Wears hearing aid in both ears     PSHx Past Surgical History:  Procedure Laterality Date  . BREAST LUMPECTOMY Left 2015  . BREAST LUMPECTOMY WITH NEEDLE LOCALIZATION Left 09/19/2013   Procedure: LEFT BREAST LUMPECTOMY WITH DOUBLE WIRE BRACKETED  NEEDLE LOCALIZATION;  Surgeon: [name], MD;  Location: [place];  Service: General;  Laterality: Left;  . CATARACT EXTRACTION W/ INTRAOCULAR LENS  IMPLANT, BILATERAL  06/2015  . CESAREAN SECTION  [place]  . CYSTOSCOPY W/ URETERAL STENT PLACEMENT Left 11/07/2017   Procedure: CYSTOSCOPY WITH RETROGRADE PYELOGRAM/URETERAL STENT PLACEMENT;  Surgeon: Cleon Gustin, MD;  Location: [facility];  Service: Urology;  Laterality: Left;  . CYSTOSCOPY W/ URETERAL STENT PLACEMENT Bilateral 04/27/2018   Procedure: CYSTOSCOPY WITH RETROGRADE PYELOGRAM;  Surgeon: Cleon Gustin, MD;  Location: [facility];  Service: Urology;  Laterality: Bilateral;  . FINGER SURGERY     lt thumb  from dog bite  . IR   NEPHROURETERAL CATH PLACE LEFT  04/28/2018  . MELANOMA EXCISION Right 12/28/2015   Procedure: EXCISION MELANOMA RIGHT LOWER LEG;  Surgeon: Fanny Skates, MD;  Location: [place];  Service: General;  Laterality: Right;  . RADIOLOGY WITH ANESTHESIA N/A 01/09/2014   Procedure: Embolization;  Surgeon: Consuella Lose, MD;  Location: [place];  Service: Radiology;  Laterality: N/A;  . SPLENECTOMY, TOTAL  2009 approx.   splen injury due to fall off horse  . TONSILLECTOMY  child  . TRANSURETHRAL RESECTION OF BLADDER TUMOR N/A 11/07/2017   Procedure: TRANSURETHRAL RESECTION OF BLADDER TUMOR (TURBT), POSSIBLE STENT PLACEMENT;  Surgeon: Cleon Gustin, MD;  Location: [facility];  Service: Urology;  Laterality: N/A;  . TRANSURETHRAL RESECTION OF BLADDER TUMOR N/A 12/14/2017   Procedure: TRANSURETHRAL RESECTION OF BLADDER TUMOR (TURBT);  Surgeon: Cleon Gustin, MD;  Location: [facility];  Service: Urology;  Laterality: N/A;  30 MINS  . TRANSURETHRAL RESECTION OF BLADDER TUMOR N/A 04/27/2018   Procedure: TRANSURETHRAL RESECTION OF BLADDER TUMOR (TURBT);  Surgeon: Cleon Gustin, MD;  Location: [facility];  Service: Urology;  Laterality: N/A;  30 MINS    SocHx  reports that she quit smoking about 13 years ago. Her smoking use included cigarettes. She has a 60.00 pack-year smoking history. She has never used smokeless tobacco. She reports that she does not drink alcohol and does not use drugs.  No Known Allergies  FamHx Family History  Problem Relation Age of Onset  . Cancer Mother        kidney  . Hypertension Mother   . Heart attack Paternal Grandfather   . Breast cancer Maternal Aunt 80  . Breast cancer Paternal Aunt 66    Prior to Admission medications   Medication Sig Start Date End Date Taking? Authorizing Provider  acetaminophen (TYLENOL) 325 MG tablet Take 1,000 mg by mouth every morning.    [provider]  aspirin EC 81 MG tablet Take 81 mg by mouth every morning.  Patient not taking: Reported on 04/07/2020    [provider]  glucose blood (TRUE METRIX BLOOD GLUCOSE TEST) test strip Use as instructed to test blood sugar once daily E08.638 Patient not taking: Reported on 04/07/2020 12/02/15   Lucille Passy, MD  lisinopril (ZESTRIL) 20 MG tablet TAKE 1 TABLET EVERY DAY 11/21/19   Nche, [name], NP  pravastatin (PRAVACHOL) 10 MG tablet Take 1 tablet (10 mg total) by mouth daily. 11/27/19   Briana Buffy, NP    Physical Exam: Vitals:   04/08/20 1300 04/08/20 1330 04/08/20 1400 04/08/20 1431  BP: 124/63 127/66 122/62 112/64  Pulse: 83 79 82 81  Resp: 19 (!) 23 (!) 24 (!) 22  Temp:      TempSrc:      SpO2: 98% 99% 99% 97%  Weight:      Height:  General: 80 y.o. female resting in bed in NAD Eyes: PERRL, normal sclera ENMT: Nares patent w/o discharge, orophaynx clear, dentition normal, ears w/o discharge/lesions/ulcers Neck: Supple, trachea midline Cardiovascular: RRR, +S1, S2, no m/g/r, equal pulses throughout Respiratory: CTABL, no w/r/r, normal WOB GI: BS+, NDNT, no masses noted, no organomegaly noted MSK: No e/c/c Skin: No rashes, bruises, ulcerations noted Neuro: A&O x 3, no focal deficits Psyc: Appropriate interaction and affect, calm/cooperative  Labs on Admission: I have personally reviewed following labs and imaging studies  CBC: Recent Labs  Lab 04/08/20 1158  WBC 13.5*  NEUTROABS 9.9*  HGB 7.0*  HCT 25.0*  MCV 75.3*  PLT 470*   Basic Metabolic Panel: Recent Labs  Lab 04/08/20 1158  NA 137  K 4.8  CL 107  CO2 21*  GLUCOSE 140*  BUN 31*  CREATININE 1.65*  CALCIUM 9.1   GFR: Estimated Creatinine Clearance: 28.5 mL/min (A) (by C-G formula based on SCr of 1.65 mg/dL (H)). Liver Function Tests: Recent Labs  Lab 04/08/20 1158  AST 12*  ALT 9  ALKPHOS 80  BILITOT 0.5  PROT 7.1  ALBUMIN 3.4*   No results for input(s):  LIPASE, AMYLASE in the last 168 hours. No results for input(s): AMMONIA in the last 168 hours. Coagulation Profile: Recent Labs  Lab 04/08/20 1320  INR 1.0   Cardiac Enzymes: No results for input(s): CKTOTAL, CKMB, CKMBINDEX, TROPONINI in the last 168 hours. BNP (last 3 results) No results for input(s): PROBNP in the last 8760 hours. HbA1C: No results for input(s): HGBA1C in the last 72 hours. CBG: No results for input(s): GLUCAP in the last 168 hours. Lipid Profile: No results for input(s): CHOL, HDL, LDLCALC, TRIG, CHOLHDL, LDLDIRECT in the last 72 hours. Thyroid Function Tests: No results for input(s): TSH, T4TOTAL, FREET4, T3FREE, THYROIDAB in the last 72 hours. Anemia Panel: No results for input(s): VITAMINB12, FOLATE, FERRITIN, TIBC, IRON, RETICCTPCT in the last 72 hours. Urine analysis:    Component Value Date/Time   COLORURINE YELLOW 08/20/2013 2018   APPEARANCEUR Clear 01/01/2020 1127   LABSPEC 1.023 08/20/2013 2018   PHURINE 6.0 08/20/2013 2018   GLUCOSEU Negative 01/01/2020 1127   HGBUR NEGATIVE 08/20/2013 2018   BILIRUBINUR Negative 01/01/2020 1127   KETONESUR 15 (A) 08/20/2013 2018   PROTEINUR Negative 01/01/2020 1127   PROTEINUR NEGATIVE 08/20/2013 2018   UROBILINOGEN 0.2 08/28/2019 1440   UROBILINOGEN 1.0 08/20/2013 2018   NITRITE Negative 01/01/2020 1127   NITRITE NEGATIVE 08/20/2013 2018   LEUKOCYTESUR Negative 01/01/2020 1127    Radiological Exams on Admission: DG Chest Portable 1 View  Result Date: 04/08/2020 CLINICAL DATA:  Shortness of breath EXAM: PORTABLE CHEST 1 VIEW COMPARISON:  Portable exam 1226 hours compared to 09/28/2017 FINDINGS: Upper normal heart size. Calcified tortuous aorta. Mediastinal contours and pulmonary vascularity normal. Lungs clear. No acute infiltrate, pleural effusion, or pneumothorax. Bones demineralized. IMPRESSION: No acute abnormalities. Aortic Atherosclerosis (ICD10-I70.0). Electronically Signed   By: Lavonia Dana M.D.    On: 04/08/2020 12:56    EKG: Independently reviewed. Sinus, no st changes  Assessment/Plan Symptomatic anemia GIB? Microcytic anemia     - admit to obs, tele     - Hgb was 13+ in 2020 (last records we can see); now 7.0 at admission     - 1 unit pRBCs ordered in ED     - check iron studies, q6 H&H     - she's FOBT positive; ED to speak with [facility]     -  continue protonix for now  DOE Elevated d-dimer     - BNP is ok, trp negative, EKG is ok     - CXR is negative     - d-dimer mildly elevated     - She is CKD3b w/ Scr 1.65; no CTA Chest; if COVID is negative, will check V/Q      - check BLE venous dopplers  Thrombocytosis     - could be reactive to anemia     - check blood smear, LDH     - complete clot w/u  HTN     - continue home regimen  HLD     - continue home regimen  CKD3b     - at baseline, follow, watch nephrotoxins  DM2     - diet controlled     - check A1c, add DM2 diet, glucose checks  DVT prophylaxis: TED  Code Status: FULL  Family Communication: w/ dtr at bedside  Consults called: GI   Status is: Observation  The patient remains OBS appropriate and will d/c before 2 midnights.  Dispo: The patient is from: Home              Anticipated d/c is to: Home              Patient currently is not medically stable to d/c.   Difficult to place patient No  Jonnie Finner DO [facility]  If 7PM-7AM, please contact night-coverage [email or web address]  04/08/2020, 2:32 PM

## 2020-04-08 NOTE — Consult Note (Signed)
Consultation  Referring Provider:     [facility] Primary Care Physician:  Flossie Buffy, NP Primary Gastroenterologist:     New-unassigned to GI    Reason for Consultation:     Heme positive iron deficiency anemia     Impression / Plan:   Briana Ochoa heme positive stool with new iron deficiency anemia Associated dyspnea on exertion and weakness No other gastrointestinal signs or symptoms   Please transfuse the patient and discharge her tomorrow and we will arrange follow-up EGD and colonoscopy as an outpatient.  I should have that information for you by discharge.  I appreciate the opportunity to care for this patient.   Gatha Mayer, MD, [facility] 04/08/2020 5:36 PM Pager [phone]           HPI:   Briana Ochoa is a 80 y.o. female with a history of bladder cancer, chronic kidney disease, obesity and diabetes mellitus among other things who was admitted to the hospital after she presented the primary care complaining of dyspnea on exertion and was discovered to have a ferritin of 6 and a hemoglobin of 7.  She was told to present for hospital care and transfusion and did so.  She has been admitted and is awaiting transfusions.  She has never had a colonoscopy or an upper endoscopy.  She is not witnessing any bleeding change in bowel habits significant abdominal pain nausea vomiting unintentional weight loss or dysphagia.  For many years her daughter tells me the patient ate a bacon or sausage egg and cheese biscuit every morning without difficulty though it seems like now she feels very fatigued or tired after she eats biscuits.  She does not restrict her diet i.e. she does eat iron-containing foods.  I am not aware of a prior history of anemia.  Her hemoglobin was 13.9 in 2020.  She does have a history of bladder cancer and had treatment and is due for a follow-up cystoscopy.  She is not complaining of hematuria.  Past Medical History:  Diagnosis  Date  . Aortic atherosclerosis ([place])   . Bladder cancer ([place])   . Bladder tumor   . Bloody diarrhea 09/29/2017  . Breast cancer ([place]) 2015   Left Breast Cancer  . CKD (chronic kidney disease), stage III ([place])   . Diabetes mellitus type 2, diet-controlled ([place])   . Dyspnea 09/28/2017  . Dysuria   . Fatigue 09/28/2017  . Frequency of urination   . Grade I diastolic dysfunction 42/59/5638   Noted on ECHO  . Gross hematuria 09/29/2017  . Heart murmur    since rheumatic fever as child  . Hematuria   . History of cardiomegaly 02/12/2005   Mild, noted on CXR  . History of CVA (cerebrovascular accident) 08/05/2014   post op cerebral angiogram, cerebral thrombosis w/ cerebral infarction;  11-02-2017  per pt no residuals  . History of malignant melanoma of skin 12/2015   s/p wide local excision right lower leg (per pt localized)  . History of rheumatic fever as a child   . Hypertension   . Intracranial aneurysm dx 07/ 2015   paraophthalmic RICA aneurysm /   s/p  Pipeline embolization right ICA 01-09-2014  . Jaundice 09/28/2017  . Malignant neoplasm of central portion of left breast in female, estrogen receptor positive [facility]) oncologist-  dr Lindi Adie   dx 07/ 2015--- DCIS, ER/PR positive-- s/p  left breast lumpectomy 09-19-2013,  completed radiation 11-12-2013,  started arimidex 11-12-2013  .  OA (osteoarthritis)    knees, hands,  shoulder  . Osteopenia 10/16/2012   Diffuse  . Personal history of radiation therapy   . 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 Sigmoid diverticulosis   . Weakness 09/28/2017  . Wears dentures    upper  . Wears glasses   . Wears hearing aid in both ears     Past Surgical History:  Procedure Laterality Date  . BREAST LUMPECTOMY Left 2015  . BREAST LUMPECTOMY WITH NEEDLE LOCALIZATION Left 09/19/2013   Procedure: LEFT BREAST LUMPECTOMY WITH DOUBLE WIRE BRACKETED  NEEDLE LOCALIZATION;  Surgeon: [name], MD;  Location: [place];  Service:  General;  Laterality: Left;  . CATARACT EXTRACTION W/ INTRAOCULAR LENS  IMPLANT, BILATERAL  06/2015  . [place]  . CHOLECYSTECTOMY OPEN  1970s  . CYSTOSCOPY W/ URETERAL STENT PLACEMENT Left 11/07/2017   Procedure: CYSTOSCOPY WITH RETROGRADE PYELOGRAM/URETERAL STENT PLACEMENT;  Surgeon: Cleon Gustin, MD;  Location[facility];  Service: Urology;  Laterality: Left;  . CYSTOSCOPY W/ URETERAL STENT PLACEMENT Bilateral 04/27/2018   Procedure: CYSTOSCOPY WITH RETROGRADE PYELOGRAM;  Surgeon: Cleon Gustin, MD;  Location: [facility];  Service: Urology;  Laterality: Bilateral;  . FINGER SURGERY     lt thumb  from dog bite  . IR  NEPHROURETERAL CATH PLACE LEFT  04/28/2018  . MELANOMA EXCISION Right 12/28/2015   Procedure: EXCISION MELANOMA RIGHT LOWER LEG;  Surgeon: Fanny Skates, MD;  Location: [place];  Service: General;  Laterality: Right;  . RADIOLOGY WITH ANESTHESIA N/A 01/09/2014   Procedure: Embolization;  Surgeon: Consuella Lose, MD;  Location: [place];  Service: Radiology;  Laterality: N/A;  . SPLENECTOMY, TOTAL  2009 approx.   splen injury due to fall off horse  . TONSILLECTOMY  child  . TRANSURETHRAL RESECTION OF BLADDER TUMOR N/A 11/07/2017   Procedure: TRANSURETHRAL RESECTION OF BLADDER TUMOR (TURBT), POSSIBLE STENT PLACEMENT;  Surgeon: Cleon Gustin, MD;  Location: [facility];  Service: Urology;  Laterality: N/A;  . TRANSURETHRAL RESECTION OF BLADDER TUMOR N/A 12/14/2017   Procedure: TRANSURETHRAL RESECTION OF BLADDER TUMOR (TURBT);  Surgeon: Cleon Gustin, MD;  Location: [facility];  Service: Urology;  Laterality: N/A;  30 MINS  . TRANSURETHRAL RESECTION OF BLADDER TUMOR N/A 04/27/2018   Procedure: TRANSURETHRAL RESECTION OF BLADDER TUMOR (TURBT);  Surgeon: Cleon Gustin, MD;  Location: [facility];  Service: Urology;  Laterality: N/A;  30 MINS     Family History  Problem Relation Age of Onset  . Cancer Mother        kidney  . Hypertension Mother   . Heart attack Paternal Grandfather   . Breast cancer Maternal Aunt 80  . Breast cancer Paternal Aunt 31    Social History   Tobacco Use  . Smoking status: Former Smoker    Packs/day: 1.50    Years: 40.00    Pack years: 60.00    Types: Cigarettes    Quit date: 03/06/2007    Years since quitting: 13.1  . Smokeless tobacco: Never Used  Vaping Use  . Vaping Use: Never used  Substance Use Topics  . Alcohol use: No  . Drug use: Never   Social History   Social History Narrative   Widowed 2013 originally from [place] moved to [place] decades ago to work for [employer]   5 children   17 grandchildren  Does not have a living will-desires CPR, does not want prolonged life support if futile.    Prior to Admission medications   Medication Sig Start Date End Date Taking? Authorizing Provider  acetaminophen (TYLENOL) 325 MG tablet Take 1,000 mg by mouth every morning.    [provider]  aspirin EC 81 MG tablet Take 81 mg by mouth every morning.  Patient not taking: Reported on 04/07/2020    [provider]  glucose blood (TRUE METRIX BLOOD GLUCOSE TEST) test strip Use as instructed to test blood sugar once daily E08.638 Patient not taking: Reported on 04/07/2020 12/02/15   Lucille Passy, MD  lisinopril (ZESTRIL) 20 MG tablet TAKE 1 TABLET EVERY DAY 11/21/19   Nche, [name], NP  pravastatin (PRAVACHOL) 10 MG tablet Take 1 tablet (10 mg total) by mouth daily. 11/27/19   Nche, [name], NP    Current Facility-Administered Medications  Medication Dose Route Frequency Provider Last Rate Last Admin  . 0.9 %  sodium chloride infusion (Manually program via Guardrails IV Fluids)   Intravenous Once Corena Herter, PA-C        Allergies as of 04/08/2020  . (No Known Allergies)     Review of Systems:     All other review of systems  are negative.  Or as per HPI      Physical Exam:  Vital signs in last 24 hours: Temp:  [97.7 F (36.5 C)-97.8 F (36.6 C)] 97.7 F (36.5 C) (03/02 1654) Pulse Rate:  [77-91] 78 (03/02 1654) Resp:  [9-24] 18 (03/02 1654) BP: (102-128)/(42-82) 124/73 (03/02 1654) SpO2:  [96 %-100 %] 100 % (03/02 1654) Weight:  [81.4 kg] 81.4 kg (03/02 1131) Last BM Date: 04/08/20 (Simultaneous filing. User may not have seen previous data.)  General:  Well-developed, well-nourished obese white woman and in no acute distress Eyes:  anicteric. Lungs: Clear to auscultation bilaterally. Heart:   S1S2, no rubs, gallops.  2-3/6 SEM Abdomen: Obese soft, non-tender, no hepatosplenomegaly, hernia, or mass and BS+.  Rectal: Deferred Lymph:  no cervical or supraclavicular adenopathy. Extremities:   no edema Neuro:  A&O x 3.  Psych:  appropriate mood and  Affect.   Data Reviewed:   LAB RESULTS: Recent Labs    04/07/20 1502 04/08/20 1158  WBC 11.7* 13.5*  HGB 7.2 Repeated and verified X2.* 7.0*  HCT 23.8 Repeated and verified X2.* 25.0*  PLT 786.0* 783*   BMET Recent Labs    04/07/20 1502 04/08/20 1158  NA 136 137  K 5.3 No hemolysis seen* 4.8  CL 104 107  CO2 24 21*  GLUCOSE 96 140*  BUN 30* 31*  CREATININE 1.46* 1.65*  CALCIUM 9.3 9.1   LFT Recent Labs    04/08/20 1158  PROT 7.1  ALBUMIN 3.4*  AST 12*  ALT 9  ALKPHOS 80  BILITOT 0.5   PT/INR Recent Labs    04/08/20 1320  LABPROT 13.2  INR 1.0    STUDIES: DG Chest Portable 1 View  Result Date: 04/08/2020 CLINICAL DATA:  Shortness of breath EXAM: PORTABLE CHEST 1 VIEW COMPARISON:  Portable exam 1226 hours compared to 09/28/2017 FINDINGS: Upper normal heart size. Calcified tortuous aorta. Mediastinal contours and pulmonary vascularity normal. Lungs clear. No acute infiltrate, pleural effusion, or pneumothorax. Bones demineralized. IMPRESSION: No acute abnormalities. Aortic Atherosclerosis (ICD10-I70.0). Electronically  Signed   By: Lavonia Dana M.D.   On: 04/08/2020 12:56   Most recent urology note 05/29/2019 reviewed   Thanks  LOS: 0 days   [email or web address]  Simonne Maffucci, MD, [facility] @  04/08/2020, 5:35 PM

## 2020-04-08 NOTE — ED Notes (Signed)
Attempted to call report x2

## 2020-04-08 NOTE — ED Notes (Signed)
Attempted to call report x 1  

## 2020-04-08 NOTE — ED Provider Notes (Signed)
I provided a substantive portion of the care of this patient.  I personally performed the entirety of the medical decision making for this encounter.  EKG Interpretation  Date/Time:  Wednesday April 08 2020 11:28:37 EST Ventricular Rate:  86 PR Interval:    QRS Duration: 107 QT Interval:  357 QTC Calculation: 427 R Axis:   40 Text Interpretation: Sinus rhythm Low voltage, precordial leads No significant change since last tracing Confirmed by Lacretia Leigh ([id number]) on 04/08/2020 12:54:65 PM  80 year old presents with increasing dyspnea on exertion.  Patient's hemoglobin is low here.  Will order blood transfusions and admit to the hospital   Lacretia Leigh, MD 04/08/20 1350

## 2020-04-08 NOTE — ED Provider Notes (Signed)
[facility] DEPT Provider Note   CSN: [id number] Arrival date & time: 04/08/20  1110     History Chief Complaint  Patient presents with  . Shortness of Breath    Briana Ochoa is a 80 y.o. female with PMH of HTN, HLD, CKD, CVA, and left-sided breast cancer s/p lumpectomy and radiation no longer on anastrozole who presents the ED sent from [facility] provider for abnormal lab.  On my examination, patient is accompanied by her daughter was at bedside.  Evidently she has been having a 4-week history of progressively worsening dyspnea with exertion.  Patient notes that she becomes short of breath with simple ambulation down the hall to her daughter's room.  She initially thought that this was perhaps deconditioning, but feels as though this is a significant deviation from her baseline.  She states that with even 10 steps she feels as though her heart is pounding as if she just ran a marathon.  Patient has a little bit of associated chest "burning" when her shortness of breath happens and she feels dizzy.  Daughter states that she has been fatigued and sleeping frequently.  Patient denies any pleuritic symptoms, hemoptysis or cough, unilateral extremity swelling or edema, recent surgeries or immobilization, abdominal pain, nausea or emesis, or other symptoms.  HPI     Past Medical History:  Diagnosis Date  . Aortic atherosclerosis ([place])   . Bladder cancer ([place])   . Bladder tumor   . Bloody diarrhea 09/29/2017  . Breast cancer ([place]) 2015   Left Breast Cancer  . CKD (chronic kidney disease), stage III ([place])   . Diabetes mellitus type 2, diet-controlled ([place])   . Dyspnea 09/28/2017  . Dysuria   . Fatigue 09/28/2017  . Frequency of urination   . Grade I diastolic dysfunction 22/29/7989   Noted on ECHO  . Gross hematuria 09/29/2017  . Heart murmur    since rheumatic fever as child  . Hematuria   . History of cardiomegaly 02/12/2005    Mild, noted on CXR  . History of CVA (cerebrovascular accident) 08/05/2014   post op cerebral angiogram, cerebral thrombosis w/ cerebral infarction;  11-02-2017  per pt no residuals  . History of malignant melanoma of skin 12/2015   s/p wide local excision right lower leg (per pt localized)  . History of rheumatic fever as a child   . Hypertension   . Intracranial aneurysm dx 07/ 2015   paraophthalmic RICA aneurysm /   s/p  Pipeline embolization right ICA 01-09-2014  . Jaundice 09/28/2017  . Malignant neoplasm of central portion of left breast in female, estrogen receptor positive [facility]) oncologist-  dr Lindi Adie   dx 07/ 2015--- DCIS, ER/PR positive-- s/p  left breast lumpectomy 09-19-2013,  completed radiation 11-12-2013,  started arimidex 11-12-2013  . OA (osteoarthritis)    knees, hands,  shoulder  . Osteopenia 10/16/2012   Diffuse  . Personal history of radiation therapy   . 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 Sigmoid diverticulosis   . Weakness 09/28/2017  . Wears dentures    upper  . Wears glasses   . Wears hearing aid in both ears     Patient Active Problem List   Diagnosis Date Noted  . Positive D dimer 04/08/2020  . Symptomatic anemia 04/08/2020  . Diabetes mellitus ([place]) 11/19/2018  . Diabetes mellitus type 2, diet-controlled ([place])   . HLD (hyperlipidemia)   . Bladder cancer ([place]) 02/09/2018  .  Weakness 09/28/2017  . Hematuria 09/28/2017  . DOE (dyspnea on exertion) 09/28/2017  . Malignant melanoma of lower leg, right ([place]) 12/28/2015  . PVD (peripheral vascular disease) ([place]) 12/01/2014  . Morbid obesity ([place]) 10/28/2014  . Cerebral thrombosis with cerebral infarction ([place]) 08/06/2014  . Intracranial aneurysm 01/09/2014  . Ductal carcinoma in situ (DCIS) of left breast 08/22/2013  . Right internal carotid artery aneurysm 08/21/2013  . Meningioma, multiple ([place]) 08/21/2013  . CKD (chronic kidney disease) stage 3, GFR 30-59 ml/min (HCC) 07/17/2013  .  History of tobacco abuse 07/17/2013  . HTN (hypertension) 04/16/2013  . OA (osteoarthritis) of knee 04/16/2013    Past Surgical History:  Procedure Laterality Date  . BREAST LUMPECTOMY Left 2015  . BREAST LUMPECTOMY WITH NEEDLE LOCALIZATION Left 09/19/2013   Procedure: LEFT BREAST LUMPECTOMY WITH DOUBLE WIRE BRACKETED  NEEDLE LOCALIZATION;  Surgeon: [name], MD;  Location: [facility];  Service: General;  Laterality: Left;  . CATARACT EXTRACTION W/ INTRAOCULAR LENS  IMPLANT, BILATERAL  06/2015  . [place]  . CHOLECYSTECTOMY OPEN  1970s  . CYSTOSCOPY W/ URETERAL STENT PLACEMENT Left 11/07/2017   Procedure: CYSTOSCOPY WITH RETROGRADE PYELOGRAM/URETERAL STENT PLACEMENT;  Surgeon: Cleon Gustin, MD;  Location: [facility];  Service: Urology;  Laterality: Left;  . CYSTOSCOPY W/ URETERAL STENT PLACEMENT Bilateral 04/27/2018   Procedure: CYSTOSCOPY WITH RETROGRADE PYELOGRAM;  Surgeon: Cleon Gustin, MD;  Location: [facility];  Service: Urology;  Laterality: Bilateral;  . FINGER SURGERY     lt thumb  from dog bite  . IR  NEPHROURETERAL CATH PLACE LEFT  04/28/2018  . MELANOMA EXCISION Right 12/28/2015   Procedure: EXCISION MELANOMA RIGHT LOWER LEG;  Surgeon: Fanny Skates, MD;  Location: [place];  Service: General;  Laterality: Right;  . RADIOLOGY WITH ANESTHESIA N/A 01/09/2014   Procedure: Embolization;  Surgeon: Consuella Lose, MD;  Location: [place] [facility];  Service: Radiology;  Laterality: N/A;  . SPLENECTOMY, TOTAL  2009 approx.   splen injury due to fall off horse  . TONSILLECTOMY  child  . TRANSURETHRAL RESECTION OF BLADDER TUMOR N/A 11/07/2017   Procedure: TRANSURETHRAL RESECTION OF BLADDER TUMOR (TURBT), POSSIBLE STENT PLACEMENT;  Surgeon: Cleon Gustin, MD;  Location: [facility]  Service: Urology;  Laterality: N/A;  . TRANSURETHRAL RESECTION OF BLADDER TUMOR N/A 12/14/2017   Procedure:  TRANSURETHRAL RESECTION OF BLADDER TUMOR (TURBT);  Surgeon: Cleon Gustin, MD;  Location: [facility];  Service: Urology;  Laterality: N/A;  30 MINS  . TRANSURETHRAL RESECTION OF BLADDER TUMOR N/A 04/27/2018   Procedure: TRANSURETHRAL RESECTION OF BLADDER TUMOR (TURBT);  Surgeon: Cleon Gustin, MD;  Location: [facility];  Service: Urology;  Laterality: N/A;  36 MINS     OB History   No obstetric history on file.    Obstetric Comments  Menarche age 15 First live birth age 80 4 children last in 65 at which time she has c-section No HRT No breast feeding No birth control        Family History  Problem Relation Age of Onset  . Cancer Mother        kidney  . Hypertension Mother   . Heart attack Paternal Grandfather   . Breast cancer Maternal Aunt 80  . Breast cancer Paternal Aunt 20    Social History   Tobacco Use  . Smoking status: Former Smoker    Packs/day: 1.50    Years:  40.00    Pack years: 60.00    Types: Cigarettes    Quit date: 03/06/2007    Years since quitting: 13.1  . Smokeless tobacco: Never Used  Vaping Use  . Vaping Use: Never used  Substance Use Topics  . Alcohol use: No  . Drug use: Never    Home Medications Prior to Admission medications   Medication Sig Start Date End Date Taking? Authorizing Provider  acetaminophen (TYLENOL) 325 MG tablet Take 1,000 mg by mouth every morning.    [provider]  aspirin EC 81 MG tablet Take 81 mg by mouth every morning.  Patient not taking: Reported on 04/07/2020    [provider]  glucose blood (TRUE METRIX BLOOD GLUCOSE TEST) test strip Use as instructed to test blood sugar once daily E08.638 Patient not taking: Reported on 04/07/2020 12/02/15   Lucille Passy, MD  lisinopril (ZESTRIL) 20 MG tablet TAKE 1 TABLET EVERY DAY 11/21/19   Nche, [name], NP  pravastatin (PRAVACHOL) 10 MG tablet Take 1 tablet (10 mg total) by mouth daily. 11/27/19   Nche,  [name], NP    Allergies    Patient has no known allergies.  Review of Systems   Review of Systems  All other systems reviewed and are negative.   Physical Exam Updated Vital Signs BP 116/66   Pulse 79   Temp 97.8 F (36.6 C) (Oral) Comment: Simultaneous filing. User may not have seen previous data.  Resp (!) 21   Ht 5\' 4"  (1.626 m)   Wt 81.4 kg   SpO2 96%   BMI 30.80 kg/m   Physical Exam Vitals and nursing note reviewed. Exam conducted with a chaperone present.  Constitutional:      General: She is not in acute distress.    Appearance: She is ill-appearing.  HENT:     Head: Normocephalic and atraumatic.  Eyes:     General: No scleral icterus.    Conjunctiva/sclera: Conjunctivae normal.  Cardiovascular:     Rate and Rhythm: Normal rate and regular rhythm.     Pulses: Normal pulses.  Pulmonary:     Effort: Pulmonary effort is normal. No respiratory distress.     Breath sounds: Normal breath sounds. No wheezing or rales.  Abdominal:     General: Abdomen is flat. There is no distension.     Palpations: Abdomen is soft.     Tenderness: There is no abdominal tenderness. There is no guarding.  Genitourinary:    Comments: No gross melena on exam. Musculoskeletal:     Right lower leg: No edema.     Left lower leg: No edema.  Skin:    General: Skin is dry.  Neurological:     Mental Status: She is alert.     GCS: GCS eye subscore is 4. GCS verbal subscore is 5. GCS motor subscore is 6.  Psychiatric:        Mood and Affect: Mood normal.        Behavior: Behavior normal.        Thought Content: Thought content normal.     ED Results / Procedures / Treatments   Labs (all labs ordered are listed, but only abnormal results are displayed) Labs Reviewed  CBC WITH DIFFERENTIAL/PLATELET - Abnormal; Notable for the following components:      Result Value   WBC 13.5 (*)    RBC 3.32 (*)    Hemoglobin 7.0 (*)    HCT 25.0 (*)  MCV 75.3 (*)    MCH 21.1 (*)     MCHC 28.0 (*)    RDW 18.3 (*)    Platelets 783 (*)    Neutro Abs 9.9 (*)    Monocytes Absolute 1.2 (*)    Eosinophils Absolute 0.7 (*)    All other components within normal limits  COMPREHENSIVE METABOLIC PANEL - Abnormal; Notable for the following components:   CO2 21 (*)    Glucose, Bld 140 (*)    BUN 31 (*)    Creatinine, Ser 1.65 (*)    Albumin 3.4 (*)    AST 12 (*)    GFR, Estimated 31 (*)    All other components within normal limits  POC OCCULT BLOOD, ED - Abnormal; Notable for the following components:   Fecal Occult Bld POSITIVE (*)    All other components within normal limits  SARS CORONAVIRUS 2 (TAT 6-24 HRS)  BRAIN NATRIURETIC PEPTIDE  PROTIME-INR  IRON AND TIBC  FERRITIN  PREPARE RBC (CROSSMATCH)  TYPE AND SCREEN  TROPONIN I (HIGH SENSITIVITY)  TROPONIN I (HIGH SENSITIVITY)    EKG EKG Interpretation  Date/Time:  Wednesday April 08 2020 11:28:37 EST Ventricular Rate:  86 PR Interval:    QRS Duration: 107 QT Interval:  357 QTC Calculation: 427 R Axis:   40 Text Interpretation: Sinus rhythm Low voltage, precordial leads No significant change since last tracing Confirmed by Lacretia Leigh ([id number]) on 04/08/2020 12:58:54 PM   Radiology DG Chest Portable 1 View  Result Date: 04/08/2020 CLINICAL DATA:  Shortness of breath EXAM: PORTABLE CHEST 1 VIEW COMPARISON:  Portable exam 1226 hours compared to 09/28/2017 FINDINGS: Upper normal heart size. Calcified tortuous aorta. Mediastinal contours and pulmonary vascularity normal. Lungs clear. No acute infiltrate, pleural effusion, or pneumothorax. Bones demineralized. IMPRESSION: No acute abnormalities. Aortic Atherosclerosis (ICD10-I70.0). Electronically Signed   By: Lavonia Dana M.D.   On: 04/08/2020 12:56    Procedures Procedures   Medications Ordered in ED Medications  0.9 %  sodium chloride infusion (Manually program via Guardrails IV Fluids) (has no administration in time range)  pantoprazole (PROTONIX) 80 mg in  sodium chloride 0.9 % 100 mL (0.8 mg/mL) infusion (8 mg/hr Intravenous New Bag/Given 04/08/20 1502)  pantoprazole (PROTONIX) 80 mg in sodium chloride 0.9 % 100 mL IVPB (0 mg Intravenous Stopped 04/08/20 1427)    ED Course  I have reviewed the triage vital signs and the nursing notes.  Pertinent labs & imaging results that were available during my care of the patient were reviewed by me and considered in my medical decision making (see chart for details).  Clinical Course as of 04/08/20 1602  Wed Apr 08, 2020  1426 I spoke with Dr. Tonie Griffith who asks that we touch base with GI given our concern for insidious GI bleed, but otherwise will see and accept patient.  [GG]    Clinical Course User Index [GG] Corena Herter, PA-C   MDM Rules/Calculators/A&P                          Briana Ochoa was evaluated in Emergency Department on 04/08/2020 for the symptoms described in the history of present illness. She was evaluated in the context of the global COVID-19 pandemic, which necessitated consideration that the patient might be at risk for infection with the SARS-CoV-2 virus that causes COVID-19. Institutional protocols and algorithms that pertain to the evaluation of patients at risk for COVID-19 are in a state  of rapid change based on information released by regulatory bodies including the CDC and federal and state organizations. These policies and algorithms were followed during the patient's care in the ED.  I personally reviewed patient's medical chart and all notes from triage and staff during today's encounter. I have also ordered and reviewed all labs and imaging that I felt to be medically necessary in the evaluation of this patient's complaints and with consideration of their physical exam. If needed, translation services were available and utilized.   Patient with a symptomatic anemia to 7.0 hemoglobin, down from baseline of 13 and labs obtained approximately 1 year ago.  This is  likely the contributing factor for her exertional dyspnea.  Initial troponin normal.  BNP unremarkable.  Plain films of chest are reviewed and without pulmonary edema or other acute cardiopulmonary disease.  CMP with BUN elevated 31, CKD relatively consistent with baseline.  Patient admits that she has not been looking at her stools.  Unsure as to hematochezia or melena.  Fecal occult positive.  She has never had a colonoscopy.  While there is no concern for large acute GI bleed, suspect this is been insidious over months.  We will treat with Protonix drip and infusion and infusion with 1 unit PRBC given her symptomatic anemia.  Patient was initially sent here given D-dimer of 0.79, however her age-adjusted D-dimer is within normal limits and I have lower suspicion for pulmonary embolism, particularly given symptomatic anemia as likely culprit.  We will hold off on CTA PE study.  Plan to admit to medicine.  I spoke with Dr. Tonie Griffith who asks that we touch base with GI given our concern for insidious GI bleed, but otherwise will see and accept patient.   Spoke with Carl Best and made her aware that patient will be admitted by medicine for symptomatic anemia evidenced by 4+ weeks of exertional dyspnea in context of positive fecal occult stool.  She will talk to Dr. Loletha Carrow but plan to round on patient.    Final Clinical Impression(s) / ED Diagnoses Final diagnoses:  Symptomatic anemia    Rx / DC Orders ED Discharge Orders    None       Corena Herter, PA-C 04/08/20 1602    Lacretia Leigh, MD 04/15/20 1022

## 2020-04-09 ENCOUNTER — Observation Stay (HOSPITAL_COMMUNITY): Payer: Medicare PPO

## 2020-04-09 ENCOUNTER — Observation Stay (HOSPITAL_BASED_OUTPATIENT_CLINIC_OR_DEPARTMENT_OTHER): Payer: Medicare PPO

## 2020-04-09 ENCOUNTER — Telehealth: Payer: Self-pay

## 2020-04-09 ENCOUNTER — Ambulatory Visit: Payer: Medicare PPO | Admitting: Nurse Practitioner

## 2020-04-09 DIAGNOSIS — E785 Hyperlipidemia, unspecified: Secondary | ICD-10-CM

## 2020-04-09 DIAGNOSIS — R7989 Other specified abnormal findings of blood chemistry: Secondary | ICD-10-CM | POA: Diagnosis not present

## 2020-04-09 DIAGNOSIS — D500 Iron deficiency anemia secondary to blood loss (chronic): Secondary | ICD-10-CM

## 2020-04-09 DIAGNOSIS — R0602 Shortness of breath: Secondary | ICD-10-CM | POA: Diagnosis not present

## 2020-04-09 DIAGNOSIS — E1169 Type 2 diabetes mellitus with other specified complication: Secondary | ICD-10-CM

## 2020-04-09 DIAGNOSIS — R195 Other fecal abnormalities: Secondary | ICD-10-CM

## 2020-04-09 DIAGNOSIS — D649 Anemia, unspecified: Secondary | ICD-10-CM | POA: Diagnosis not present

## 2020-04-09 LAB — TYPE AND SCREEN
ABO/RH(D): O NEG
Antibody Screen: NEGATIVE
Unit division: 0

## 2020-04-09 LAB — CBC
HCT: 27.6 % — ABNORMAL LOW (ref 36.0–46.0)
Hemoglobin: 8.1 g/dL — ABNORMAL LOW (ref 12.0–15.0)
MCH: 22.4 pg — ABNORMAL LOW (ref 26.0–34.0)
MCHC: 29.3 g/dL — ABNORMAL LOW (ref 30.0–36.0)
MCV: 76.5 fL — ABNORMAL LOW (ref 80.0–100.0)
Platelets: 690 10*3/uL — ABNORMAL HIGH (ref 150–400)
RBC: 3.61 MIL/uL — ABNORMAL LOW (ref 3.87–5.11)
RDW: 18 % — ABNORMAL HIGH (ref 11.5–15.5)
WBC: 13 10*3/uL — ABNORMAL HIGH (ref 4.0–10.5)
nRBC: 0.2 % (ref 0.0–0.2)

## 2020-04-09 LAB — COMPREHENSIVE METABOLIC PANEL
ALT: 9 U/L (ref 0–44)
AST: 11 U/L — ABNORMAL LOW (ref 15–41)
Albumin: 3 g/dL — ABNORMAL LOW (ref 3.5–5.0)
Alkaline Phosphatase: 66 U/L (ref 38–126)
Anion gap: 9 (ref 5–15)
BUN: 26 mg/dL — ABNORMAL HIGH (ref 8–23)
CO2: 22 mmol/L (ref 22–32)
Calcium: 9 mg/dL (ref 8.9–10.3)
Chloride: 109 mmol/L (ref 98–111)
Creatinine, Ser: 1.66 mg/dL — ABNORMAL HIGH (ref 0.44–1.00)
GFR, Estimated: 31 mL/min — ABNORMAL LOW (ref >60)
Glucose, Bld: 112 mg/dL — ABNORMAL HIGH (ref 70–99)
Potassium: 4.7 mmol/L (ref 3.5–5.1)
Sodium: 140 mmol/L (ref 135–145)
Total Bilirubin: 0.7 mg/dL (ref 0.3–1.2)
Total Protein: 6.3 g/dL — ABNORMAL LOW (ref 6.5–8.1)

## 2020-04-09 LAB — HEMOGLOBIN A1C
Hgb A1c MFr Bld: 6.2 % — ABNORMAL HIGH (ref 4.8–5.6)
Mean Plasma Glucose: 131.24 mg/dL

## 2020-04-09 LAB — BPAM RBC
Blood Product Expiration Date: 202204032359
ISSUE DATE / TIME: 202203021743
Unit Type and Rh: 9500

## 2020-04-09 LAB — SARS CORONAVIRUS 2 (TAT 6-24 HRS): SARS Coronavirus 2: NEGATIVE

## 2020-04-09 LAB — GLUCOSE, CAPILLARY
Glucose-Capillary: 114 mg/dL — ABNORMAL HIGH (ref 70–99)
Glucose-Capillary: 134 mg/dL — ABNORMAL HIGH (ref 70–99)
Glucose-Capillary: 105 mg/dL — ABNORMAL HIGH (ref 70–99)

## 2020-04-09 MED ORDER — FERROUS GLUCONATE 324 (38 FE) MG PO TABS: 324.0000 mg | ORAL_TABLET | Freq: Two times a day (BID) | ORAL | 0 refills | Status: DC

## 2020-04-09 MED ORDER — TECHNETIUM TO 99M ALBUMIN AGGREGATED
4.4000 | Freq: Once | INTRAVENOUS | Status: AC
  Administered 2020-04-09: 4.4 via INTRAVENOUS

## 2020-04-09 MED ORDER — SODIUM CHLORIDE 0.9 % IV SOLN
510.0000 mg | Freq: Once | INTRAVENOUS | Status: AC
  Administered 2020-04-09: 510 mg via INTRAVENOUS
  Filled 2020-04-09: qty 510

## 2020-04-09 NOTE — Progress Notes (Signed)
A few minute after iron infusion (feraheme) began, patient notified nurse that she was "feeling funny."  When nurse assessed patient, patient claimed to be short of breath, have heart palpitations, and feel flushed.  Iron infusion immediately stopped and MD paged.  Vital signs taken.  After medication stopped, patient stated she feels much better.  Patient placed back on telemetry.  Will continue to monitor for any further needed interventions.  Virginia Rochester, RN

## 2020-04-09 NOTE — Progress Notes (Signed)
Bilateral lower extremity venous duplex has been completed. Preliminary results can be found in CV Proc through chart review.  Results were given to the patient's nurse, Jarrett Soho.  04/09/20 9:07 AM Carlos Levering RVT

## 2020-04-09 NOTE — Telephone Encounter (Signed)
-----   Message from Gatha Mayer, MD sent at 04/08/2020  5:48 PM EST ----- Regarding: Needs a double This lady has iron deficiency anemia and will get transfused and go home on 3 /3  She needs an EGD colonoscopy I looked and of course there really are not any openings for anybody with double slots in the near future  What I see as decent possibilities are to put doubles into single slots on either 3/15 or 3/17 at 730 given my schedule a number of Endo is that day it should not make things too bad  Or 3/21 at 330  Please let me know what we come up with she will need a previsit as well  Thanks

## 2020-04-09 NOTE — Telephone Encounter (Signed)
She has a superficial clot and does not need anticoagulation so she was going home and we can still do outpatient procedure

## 2020-04-09 NOTE — Discharge Summary (Addendum)
Physician Discharge Summary  Briana Ochoa GMW:[id number] DOB: 01-18-1941 DOA: 04/08/2020  PCP: Flossie Buffy, NP  Admit date: 04/08/2020 Discharge date: 04/09/2020  Admitted From: Home Disposition: Home  Recommendations for Outpatient Follow-up:  1. Follow up with PCP in 1 week 2. Follow up with GI as an outpatient for EGD and colonoscopy 3. Please obtain BMP/CBC in one week 4. Repeat LE venous duplex for right DVT follow-up 5. Please follow up on the following pending results: None  Home Health: None Equipment/Devices: None  Discharge Condition: Stable CODE STATUS: Full code Diet recommendation: Heart healthy   Brief/Interim Summary:  Admission HPI written by Jonnie Finner, DO   Chief Complaint: dyspnea on exertion, fatigue.  HPI: Briana Ochoa is a 80 y.o. female with medical history significant of CKD3b, HTN, DM2, HLD. Presenting with DOE and fatigue. She reports that it started about a month ago. It's difficult to walk more than 10 feet without dyspnea. She takes a 5-minute rest to recover when she becomes dyspneic. She has no chest pain, palpitations, fever or edema during this time. She didn't try any medicines to help. She endured these symptoms for a month and decided to see her PCP about it. Yesterday labs were drawn. She was notified this morning that she had an elevated d-dimer and that she should come to the ED for evaluation. She denies any other aggravating or alleviating factors.      Hospital course:  Dyspnea on exertion Secondary to anemia. Resolved with blood transfusion. Perfusion scan was negative for an acute PE. Resolved.  Chronic blood loss anemia Iron deficiency anemia Symptomatic anemia Iron panel significant for iron deficiency anemia pattern in setting of fecal occult positive stools. Patient given 1 unit of PRBC. Hemoglobin stable prior to discharge with no overt hematochezia or melena noted. IV iron given prior to  discharge, however patient had a hypersensitivity reaction after about 1/3 of the infusion had completed. Hypersensitivity added to allergies list. Patient stable prior to discharge. Discharge with oral iron.  Fecal occult positive stool In association with anemia. GI consulted and recommended outpatient follow-up with plan for EGD and colonoscopy.  Right DVT DVT involves right gastrocnemius veins. Patient is asymptomatic and DVT was identified incidentally. No anticoagulation on discharge. Recommend to schedule patient for repeat right LE venous duplex to ensure improvement of DVT.  Hyperlipidemia Continue pravastatin  CKD stage IIIb Stable.  Diabetes mellitus, type 2 Diet controlled. Hemoglobin A1C of 6.2%  Obesity Body mass index is 30.8 kg/m.    Discharge Diagnoses:  Principal Problem:   Symptomatic anemia Active Problems:   CKD (chronic kidney disease) stage 3, GFR 30-59 ml/min (HCC)   Diabetes mellitus type 2, diet-controlled (HCC)   Iron deficiency anemia due to chronic blood loss   Type 2 diabetes mellitus with hyperlipidemia [facility])    Discharge Instructions  Discharge Instructions    Increase activity slowly   Complete by: As directed      Allergies as of 04/09/2020      Reactions   Feraheme [ferumoxytol] Shortness Of Breath, Palpitations   Flused face; palpitations, SOB      Medication List    TAKE these medications   acetaminophen 500 MG tablet Commonly known as: TYLENOL Take 1,000 mg by mouth daily.   albuterol 108 (90 Base) MCG/ACT inhaler Commonly known as: VENTOLIN HFA Inhale 2 puffs into the lungs every 4 (four) hours.   ferrous gluconate 324 MG tablet Commonly known as: FERGON Take  1 tablet (324 mg total) by mouth 2 (two) times daily with a meal.   glucose blood test strip Commonly known as: True Metrix Blood Glucose Test Use as instructed to test blood sugar once daily E08.638   lisinopril 20 MG tablet Commonly known as: ZESTRIL TAKE 1  TABLET EVERY DAY   pravastatin 10 MG tablet Commonly known as: PRAVACHOL Take 1 tablet (10 mg total) by mouth daily.       Follow-up Information    Nche, [name], NP. Schedule an appointment as soon as possible for a visit in 1 week(s).   Specialty: Internal Medicine Contact information: [place] [address] [phone]        Gatha Mayer, MD. Schedule an appointment as soon as possible for a visit in 1 week(s).   Specialty: Gastroenterology Why: EGD and Colonoscopy Contact information: [address] [place] [address] [phone]              Allergies  Allergen Reactions  . Feraheme [Ferumoxytol] Shortness Of Breath and Palpitations    Flused face; palpitations, SOB    Consultations:  Gastroenterology [facility])   Procedures/Studies: NM Pulmonary Perfusion  Result Date: 04/09/2020 CLINICAL DATA:  Shortness of breath for the past 3 weeks. Ex-smoker. EXAM: NUCLEAR MEDICINE PERFUSION LUNG SCAN TECHNIQUE: Perfusion images were obtained in multiple projections after intravenous injection of radiopharmaceutical. Ventilation scans intentionally deferred if perfusion scan and chest x-ray adequate for interpretation during COVID 19 epidemic. RADIOPHARMACEUTICALS:  4.4 mCi Tc-62m MAA IV COMPARISON:  Portable chest dated yesterday. FINDINGS: Normal perfusion of both lungs.  No perfusion defects seen. IMPRESSION: Normal examination.  No evidence of pulmonary embolism. Electronically Signed   By: Claudie Revering M.D.   On: 04/09/2020 12:20   DG Chest Portable 1 View  Result Date: 04/08/2020 CLINICAL DATA:  Shortness of breath EXAM: PORTABLE CHEST 1 VIEW COMPARISON:  Portable exam 1226 hours compared to 09/28/2017 FINDINGS: Upper normal heart size. Calcified tortuous aorta. Mediastinal contours and pulmonary vascularity normal. Lungs clear. No acute infiltrate, pleural effusion, or pneumothorax. Bones demineralized. IMPRESSION: No acute  abnormalities. Aortic Atherosclerosis (ICD10-I70.0). Electronically Signed   By: Lavonia Dana M.D.   On: 04/08/2020 12:56   VAS [place] LOWER EXTREMITY VENOUS (DVT)  Result Date: 04/09/2020  Lower Venous DVT Study Indications: Elevated Ddimer.  Risk Factors: None identified. Comparison Study: No prior studies. Performing Technologist: Oliver Hum RVT  Examination Guidelines: A complete evaluation includes B-mode imaging, spectral Doppler, color Doppler, and power Doppler as needed of all accessible portions of each vessel. Bilateral testing is considered an integral part of a complete examination. Limited examinations for reoccurring indications may be performed as noted. The reflux portion of the exam is performed with the patient in reverse Trendelenburg.  +---------+---------------+---------+-----------+----------+--------------+ RIGHT    CompressibilityPhasicitySpontaneityPropertiesThrombus Aging +---------+---------------+---------+-----------+----------+--------------+ CFV      Full           Yes      Yes                                 +---------+---------------+---------+-----------+----------+--------------+ SFJ      Full                                                        +---------+---------------+---------+-----------+----------+--------------+  FV Prox  Full                                                        +---------+---------------+---------+-----------+----------+--------------+ FV Mid   Full                                                        +---------+---------------+---------+-----------+----------+--------------+ FV DistalFull                                                        +---------+---------------+---------+-----------+----------+--------------+ PFV      Full                                                        +---------+---------------+---------+-----------+----------+--------------+ POP      Full           Yes       Yes                                 +---------+---------------+---------+-----------+----------+--------------+ PTV      Full                                                        +---------+---------------+---------+-----------+----------+--------------+ PERO     Full                                                        +---------+---------------+---------+-----------+----------+--------------+ Gastroc  None                                         Acute          +---------+---------------+---------+-----------+----------+--------------+   +---------+---------------+---------+-----------+----------+--------------+ LEFT     CompressibilityPhasicitySpontaneityPropertiesThrombus Aging +---------+---------------+---------+-----------+----------+--------------+ CFV      Full           Yes      Yes                                 +---------+---------------+---------+-----------+----------+--------------+ SFJ      Full                                                        +---------+---------------+---------+-----------+----------+--------------+  FV Prox  Full                                                        +---------+---------------+---------+-----------+----------+--------------+ FV Mid   Full                                                        +---------+---------------+---------+-----------+----------+--------------+ FV DistalFull                                                        +---------+---------------+---------+-----------+----------+--------------+ PFV      Full                                                        +---------+---------------+---------+-----------+----------+--------------+ POP      Full           Yes      Yes                                 +---------+---------------+---------+-----------+----------+--------------+ PTV      Full                                                         +---------+---------------+---------+-----------+----------+--------------+ PERO     Full                                                        +---------+---------------+---------+-----------+----------+--------------+     Summary: RIGHT: - Findings consistent with acute deep vein thrombosis involving the right gastrocnemius veins. - No cystic structure found in the popliteal fossa.  LEFT: - There is no evidence of deep vein thrombosis in the lower extremity.  - No cystic structure found in the popliteal fossa.  *See table(s) above for measurements and observations. Electronically signed by Monica Martinez MD on 04/09/2020 at 3:43:34 PM.    Final      Subjective: No concerns today. No dyspnea with exertion.  Discharge Exam: Vitals:   04/09/20 1442 04/09/20 1658  BP: 134/81 130/66  Pulse: 81 73  Resp: (!) 22 20  Temp: 98.2 F (36.8 C) 98 F (36.7 C)  SpO2: 97%    Vitals:   04/09/20 0337 04/09/20 1030 04/09/20 1442 04/09/20 1658  BP: (!) 102/52 127/67 134/81 130/66  Pulse: 71  81 73  Resp: 18  (!) 22 20  Temp: 98.2 F (36.8 C)  98.2 F (36.8 C) 98 F (36.7 C)  TempSrc:   Oral Oral  SpO2: 96%  97%   Weight:      Height:        General: Pt is alert, awake, not in acute distress Cardiovascular: RRR, S1/S2 +, no rubs, no gallops Respiratory: CTA bilaterally, no wheezing, no rhonchi Abdominal: Soft, NT, ND, bowel sounds + Extremities: no edema, no cyanosis    The results of significant diagnostics from this hospitalization (including imaging, microbiology, ancillary and laboratory) are listed below for reference.     Microbiology: Recent Results (from the past 240 hour(s))  SARS CORONAVIRUS 2 (TAT 6-24 HRS) Nasopharyngeal Nasopharyngeal Swab     Status: None   Collection Time: 04/08/20  3:02 PM   Specimen: Nasopharyngeal Swab  Result Value Ref Range Status   SARS Coronavirus 2 NEGATIVE NEGATIVE Final    Comment: (NOTE) SARS-CoV-2 target nucleic acids are NOT  DETECTED.  The SARS-CoV-2 RNA is generally detectable in upper and lower respiratory specimens during the acute phase of infection. Negative results do not preclude SARS-CoV-2 infection, do not rule out co-infections with other pathogens, and should not be used as the sole basis for treatment or other patient management decisions. Negative results must be combined with clinical observations, patient history, and epidemiological information. The expected result is Negative.  Fact Sheet for Patients: SugarRoll.be  Fact Sheet for Healthcare Providers: https://www.woods-mathews.com/  This test is not yet approved or cleared by the [place] FDA and  has been authorized for detection and/or diagnosis of SARS-CoV-2 by FDA under an Emergency Use Authorization (EUA). This EUA will remain  in effect (meaning this test can be used) for the duration of the COVID-19 declaration under Se ction 564(b)(1) of the Act, 21 U.S.C. section 360bbb-3(b)(1), unless the authorization is terminated or revoked sooner.  Performed at [facility], [place] [address] [place], [place] [address]      Labs: BNP (last 3 results) Recent Labs    04/08/20 1158  BNP 29.9   Basic Metabolic Panel: Recent Labs  Lab 04/07/20 1502 04/08/20 1158 04/09/20 0022  NA 136 137 140  K 5.3 No hemolysis seen* 4.8 4.7  CL 104 107 109  CO2 24 21* 22  GLUCOSE 96 140* 112*  BUN 30* 31* 26*  CREATININE 1.46* 1.65* 1.66*  CALCIUM 9.3 9.1 9.0   Liver Function Tests: Recent Labs  Lab 04/08/20 1158 04/09/20 0022  AST 12* 11*  ALT 9 9  ALKPHOS 80 66  BILITOT 0.5 0.7  PROT 7.1 6.3*  ALBUMIN 3.4* 3.0*   No results for input(s): LIPASE, AMYLASE in the last 168 hours. No results for input(s): AMMONIA in the last 168 hours. CBC: Recent Labs  Lab 04/07/20 1502 04/08/20 1158 04/08/20 2032 04/09/20 0022  WBC 11.7* 13.5*  --  13.0*  NEUTROABS 7.6 9.9*  --   --   HGB  7.2 Repeated and verified X2.* 7.0* 8.5* 8.1*  HCT 23.8 Repeated and verified X2.* 25.0* 28.9* 27.6*  MCV 71.9* 75.3*  --  76.5*  PLT 786.0* 783*  --  690*   Cardiac Enzymes: No results for input(s): CKTOTAL, CKMB, CKMBINDEX, TROPONINI in the last 168 hours. BNP: Invalid input(s): POCBNP CBG: Recent Labs  Lab 04/08/20 2031 04/09/20 0742 04/09/20 1150 04/09/20 1631  GLUCAP 125* 114* 134* 105*   D-Dimer Recent Labs    04/07/20 1502  DDIMER 0.79*   Hgb A1c Recent Labs    04/09/20 0022  HGBA1C 6.2*   Lipid Profile No results for input(s):  CHOL, HDL, LDLCALC, TRIG, CHOLHDL, LDLDIRECT in the last 72 hours. Thyroid function studies No results for input(s): TSH, T4TOTAL, T3FREE, THYROIDAB in the last 72 hours.  Invalid input(s): FREET3 Anemia work up Recent Labs    04/08/20 1202  FERRITIN 6*  TIBC 487*  IRON 10*   Urinalysis    Component Value Date/Time   COLORURINE YELLOW 08/20/2013 2018   APPEARANCEUR Clear 01/01/2020 1127   LABSPEC 1.023 08/20/2013 2018   PHURINE 6.0 08/20/2013 2018   GLUCOSEU Negative 01/01/2020 1127   HGBUR NEGATIVE 08/20/2013 2018   BILIRUBINUR Negative 01/01/2020 1127   KETONESUR 15 (A) 08/20/2013 2018   PROTEINUR Negative 01/01/2020 1127   PROTEINUR NEGATIVE 08/20/2013 2018   UROBILINOGEN 0.2 08/28/2019 1440   UROBILINOGEN 1.0 08/20/2013 2018   NITRITE Negative 01/01/2020 1127   NITRITE NEGATIVE 08/20/2013 2018   LEUKOCYTESUR Negative 01/01/2020 1127   Sepsis Labs Invalid input(s): PROCALCITONIN,  WBC,  LACTICIDVEN Microbiology Recent Results (from the past 240 hour(s))  SARS CORONAVIRUS 2 (TAT 6-24 HRS) Nasopharyngeal Nasopharyngeal Swab     Status: None   Collection Time: 04/08/20  3:02 PM   Specimen: Nasopharyngeal Swab  Result Value Ref Range Status   SARS Coronavirus 2 NEGATIVE NEGATIVE Final    Comment: (NOTE) SARS-CoV-2 target nucleic acids are NOT DETECTED.  The SARS-CoV-2 RNA is generally detectable in upper and  lower respiratory specimens during the acute phase of infection. Negative results do not preclude SARS-CoV-2 infection, do not rule out co-infections with other pathogens, and should not be used as the sole basis for treatment or other patient management decisions. Negative results must be combined with clinical observations, patient history, and epidemiological information. The expected result is Negative.  Fact Sheet for Patients: SugarRoll.be  Fact Sheet for Healthcare Providers: https://www.woods-mathews.com/  This test is not yet approved or cleared by the [place] FDA and  has been authorized for detection and/or diagnosis of SARS-CoV-2 by FDA under an Emergency Use Authorization (EUA). This EUA will remain  in effect (meaning this test can be used) for the duration of the COVID-19 declaration under Se ction 564(b)(1) of the Act, 21 U.S.C. section 360bbb-3(b)(1), unless the authorization is terminated or revoked sooner.  Performed at [facility], [place] [address], [place], [place] [address]     SIGNED:   Cordelia Poche, MD [facility] 04/09/2020, 5:58 PM

## 2020-04-09 NOTE — Discharge Instructions (Signed)
[employer] medicine (25th ed., pp. 1059-1068). [place], [place]: Elsevier.">  Anemia  Anemia is a condition in which there is not enough red blood cells or hemoglobin in the blood. Hemoglobin is a substance in red blood cells that carries oxygen. When you do not have enough red blood cells or hemoglobin (are anemic), your body cannot get enough oxygen and your organs may not work properly. As a result, you may feel very tired or have other problems. What are the causes? Common causes of anemia include:  Excessive bleeding. Anemia can be caused by excessive bleeding inside or outside the body, including bleeding from the intestines or from heavy menstrual periods in females.  Poor nutrition.  Long-lasting (chronic) kidney, thyroid, and liver disease.  Bone marrow disorders, spleen problems, and blood disorders.  Cancer and treatments for cancer.  HIV (human immunodeficiency virus) and AIDS (acquired immunodeficiency syndrome).  Infections, medicines, and autoimmune disorders that destroy red blood cells. What are the signs or symptoms? Symptoms of this condition include:  Minor weakness.  Dizziness.  Headache, or difficulties concentrating and sleeping.  Heartbeats that feel irregular or faster than normal (palpitations).  Shortness of breath, especially with exercise.  Pale skin, lips, and nails, or cold hands and feet.  Indigestion and nausea. Symptoms may occur suddenly or develop slowly. If your anemia is mild, you may not have symptoms. How is this diagnosed? This condition is diagnosed based on blood tests, your medical history, and a physical exam. In some cases, a test may be needed in which cells are removed from the soft tissue inside of a bone and looked at under a microscope (bone marrow biopsy). Your health care provider may also check your stool (feces) for blood and may do additional testing to look for the cause of your bleeding. Other tests may  include:  Imaging tests, such as a CT scan or MRI.  A procedure to see inside your esophagus and stomach (endoscopy).  A procedure to see inside your colon and rectum (colonoscopy). How is this treated? Treatment for this condition depends on the cause. If you continue to lose a lot of blood, you may need to be treated at a hospital. Treatment may include:  Taking supplements of iron, vitamin B12, or folic acid.  Taking a hormone medicine (erythropoietin) that can help to stimulate red blood cell growth.  Having a blood transfusion. This may be needed if you lose a lot of blood.  Making changes to your diet.  Having surgery to remove your spleen. Follow these instructions at home:  Take over-the-counter and prescription medicines only as told by your health care provider.  Take supplements only as told by your health care provider.  Follow any diet instructions that you were given by your health care provider.  Keep all follow-up visits as told by your health care provider. This is important. Contact a health care provider if:  You develop new bleeding anywhere in the body. Get help right away if:  You are very weak.  You are short of breath.  You have pain in your abdomen or chest.  You are dizzy or feel faint.  You have trouble concentrating.  You have bloody stools, black stools, or tarry stools.  You vomit repeatedly or you vomit up blood. These symptoms may represent a serious problem that is an emergency. Do not wait to see if the symptoms will go away. Get medical help right away. Call your local emergency services (911 in the [place] Do not   drive yourself to the hospital. Summary  Anemia is a condition in which you do not have enough red blood cells or enough of a substance in your red blood cells that carries oxygen (hemoglobin).  Symptoms may occur suddenly or develop slowly.  If your anemia is mild, you may not have symptoms.  This condition is  diagnosed with blood tests, a medical history, and a physical exam. Other tests may be needed.  Treatment for this condition depends on the cause of the anemia. This information is not intended to replace advice given to you by your health care provider. Make sure you discuss any questions you have with your health care provider. Document Revised: 01/01/2019 Document Reviewed: 01/01/2019 Elsevier Patient Education  2021 [employer]vier Inc.  

## 2020-04-09 NOTE — Progress Notes (Addendum)
Briana Ochoa  CC:  Anemia, FOBT +  Subjective: She reports feeling well at this time. Appetite is good. No bowel movement since she was admitted to the hospital yesterday. She denies any prior rectal bleeding or melena but admits she does not really look at her stool. She denies ever having a colonoscopy. No chest pain. No current shortness of breath. No upper or lower abdominal pain. She is on her way to nuclear medicine for VQ scan to rule out PE.   Objective:  Vital signs in last 24 hours: Temp:  [97.5 F (36.4 C)-98.2 F (36.8 C)] 98.2 F (36.8 C) (03/03 0337) Pulse Rate:  [71-91] 71 (03/03 0337) Resp:  [9-24] 18 (03/03 0337) BP: (102-132)/(42-99) 127/67 (03/03 1030) SpO2:  [95 %-100 %] 96 % (03/03 0337) Weight:  [81.4 kg] 81.4 kg (03/02 1131) Last BM Date: 04/08/20 General: 80 year old female alert in no acute distress. Heart: Regular rate and rhythm, no murmurs. Pulm: Breath sounds clear throughout. Abdomen: Soft, nondistended. Nontender. 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 Extremities:  Without edema. Neurologic:  Alert and  oriented x4;  grossly normal neurologically. Psych:  Alert and cooperative. Normal mood and affect.  Intake/Output from previous day: 03/02 0701 - 03/03 0700 In: 430 [Blood:330; IV Piggyback:100] Out: -  Intake/Output this shift: Total I/O In: 827 [P.O.:827] Out: -   Lab Results: Recent Labs    04/07/20 1502 04/08/20 1158 04/08/20 2032 04/09/20 0022  WBC 11.7* 13.5*  --  13.0*  HGB 7.2 Repeated and verified X2.* 7.0* 8.5* 8.1*  HCT 23.8 Repeated and verified X2.* 25.0* 28.9* 27.6*  PLT 786.0* 783*  --  690*   BMET Recent Labs    04/07/20 1502 04/08/20 1158 04/09/20 0022  NA 136 137 140  K 5.3 No hemolysis seen* 4.8 4.7  CL 104 107 109  CO2 24 21* 22  GLUCOSE 96 140* 112*  BUN 30* 31* 26*   CREATININE 1.46* 1.65* 1.66*  CALCIUM 9.3 9.1 9.0   LFT Recent Labs    04/09/20 0022  PROT 6.3*  ALBUMIN 3.0*  AST 11*  ALT 9  ALKPHOS 66  BILITOT 0.7   PT/INR Recent Labs    04/08/20 1320  LABPROT 13.2  INR 1.0   Hepatitis Panel No results for input(s): HEPBSAG, HCVAB, HEPAIGM, HEPBIGM in the last 72 hours.  DG Chest Portable 1 View  Result Date: 04/08/2020 CLINICAL DATA:  Shortness of breath EXAM: PORTABLE CHEST 1 VIEW COMPARISON:  Portable exam 1226 hours compared to 09/28/2017 FINDINGS: Upper normal heart size. Calcified tortuous aorta. Mediastinal contours and pulmonary vascularity normal. Lungs clear. No acute infiltrate, pleural effusion, or pneumothorax. Bones demineralized. IMPRESSION: No acute abnormalities. Aortic Atherosclerosis (ICD10-I70.0). Electronically Signed   By: Lavonia Dana M.D.   On: 04/08/2020 12:56   VAS [place] LOWER EXTREMITY VENOUS (DVT)  Result Date: 04/09/2020  Lower Venous DVT Study Indications: Elevated Ddimer.  Risk Factors: None identified. Comparison Study: No prior studies. Performing Technologist: Oliver Hum RVT  Examination Guidelines: A complete evaluation includes B-mode imaging, spectral Doppler, color Doppler, and power Doppler as needed of all accessible portions of each vessel. Bilateral testing is considered an integral part of a complete examination. Limited examinations for reoccurring indications may be performed as noted. The reflux portion of the exam is performed with the patient in reverse Trendelenburg.  +---------+---------------+---------+-----------+----------+--------------+ RIGHT    CompressibilityPhasicitySpontaneityPropertiesThrombus Aging +---------+---------------+---------+-----------+----------+--------------+  CFV      Full           Yes      Yes                                 +---------+---------------+---------+-----------+----------+--------------+ SFJ      Full                                                         +---------+---------------+---------+-----------+----------+--------------+ FV Prox  Full                                                        +---------+---------------+---------+-----------+----------+--------------+ FV Mid   Full                                                        +---------+---------------+---------+-----------+----------+--------------+ FV DistalFull                                                        +---------+---------------+---------+-----------+----------+--------------+ PFV      Full                                                        +---------+---------------+---------+-----------+----------+--------------+ POP      Full           Yes      Yes                                 +---------+---------------+---------+-----------+----------+--------------+ PTV      Full                                                        +---------+---------------+---------+-----------+----------+--------------+ PERO     Full                                                        +---------+---------------+---------+-----------+----------+--------------+ Gastroc  None                                         Acute          +---------+---------------+---------+-----------+----------+--------------+   +---------+---------------+---------+-----------+----------+--------------+  LEFT     CompressibilityPhasicitySpontaneityPropertiesThrombus Aging +---------+---------------+---------+-----------+----------+--------------+ CFV      Full           Yes      Yes                                 +---------+---------------+---------+-----------+----------+--------------+ SFJ      Full                                                        +---------+---------------+---------+-----------+----------+--------------+ FV Prox  Full                                                         +---------+---------------+---------+-----------+----------+--------------+ FV Mid   Full                                                        +---------+---------------+---------+-----------+----------+--------------+ FV DistalFull                                                        +---------+---------------+---------+-----------+----------+--------------+ PFV      Full                                                        +---------+---------------+---------+-----------+----------+--------------+ POP      Full           Yes      Yes                                 +---------+---------------+---------+-----------+----------+--------------+ PTV      Full                                                        +---------+---------------+---------+-----------+----------+--------------+ PERO     Full                                                        +---------+---------------+---------+-----------+----------+--------------+     Summary: RIGHT: - Findings consistent with acute deep vein thrombosis involving the right gastrocnemius veins. - No cystic structure found in the popliteal fossa.  LEFT: - There is no evidence of deep vein thrombosis in the lower extremity.  - No cystic  structure found in the popliteal fossa.  *See table(s) above for measurements and observations.    Preliminary     Assessment / Plan:  57. 80 year old female admitted to the hospital with fatigue, DOE and microcytic anemia. Admission Hemoglobin 7 with baseline Hg level 13 in 2020. Transfused 1 unit of PRBCs Hg 8.5 -> 8.1.  FOBT positive on a brown-colored stool. No overt GI bleeding.  -Initial plan was for EGD and colonoscopy as an outpatient, refer to Dr. Evern Bio consult Ochoa 3/2. However, in setting of newly diagnosed RLE DVT ( VQ scan pending) will she need endoscopic evaluation during hospital admission prior to initiating anticoagulation? Await recommendations from Dr.  Loletha Carrow. -Continue to monitor the patient for active GI bleeding -Repeat CBC in a.m. -Change PPI gg to Pantoprazole 40mg  IV bid -Further recommendations per Dr. Loletha Carrow   2. DOE with elevated D-dimer. COVID-19 is negative. LE doppler + for  RLE DVT. VQ scan ordered by the hospitalist  3. CKD. Cr 1.66     Active Problems:   Symptomatic anemia     LOS: 0 days   Briana Ochoa  04/09/2020, 11:18 AM   I have discussed the case with the PA, and that is the plan I formulated. I personally interviewed and examined the patient.  Ms. Briana Ochoa looks well and feels well.  No pulmonary embolism on VQ scan.  Lower extremity vascular thrombosis is reportedly in a vessel that does not require oral anticoagulation according to the patient's hospitalist.  She still has no overt GI bleeding, and is comfortable with the plan for discharge home today and outpatient upper endoscopy and colonoscopy with Dr. Carlean Purl in a few weeks.  We would make those arrangements by contacting her soon.  I think she would benefit from a dose of IV iron prior to discharge today.  Nelida Meuse III Office: [phone]

## 2020-04-09 NOTE — Telephone Encounter (Signed)
Dr. Carlean Purl is outpatient procedure still the plan?  Was reading inpatient notes?  Okay to proceed with scheduling [place] endo/colon?

## 2020-04-13 ENCOUNTER — Telehealth: Payer: Self-pay

## 2020-04-13 NOTE — Telephone Encounter (Signed)
Patient has been scheduled for pre-visit on 3/9 virtual at 2:30 and endo/colon 7:30 on 3/17

## 2020-04-13 NOTE — Telephone Encounter (Signed)
During TCM call, patient states she has a knot on her antecubital area, very close to where her IV was located. She states the knot appeared on Saturday & is mildly tender to the touch & is about the size of a quarter or a little larger. She says the area is not bruised. She has her hosp f/u appt on 04/16/20.

## 2020-04-13 NOTE — Telephone Encounter (Signed)
Left message for patient to call back  

## 2020-04-13 NOTE — Telephone Encounter (Signed)
Advise to apply cold compress 2-3x/day, 33mins each. If worsening pain and size, she needs to schedule an appt sooner. Thank you

## 2020-04-13 NOTE — Telephone Encounter (Signed)
Transition Care Management Follow-up Telephone Call  Date of discharge and from where: 04/09/2020-[facility]  How have you been since you were released from the hospital? Pretty good. Back at work since Friday  Any questions or concerns? Yes-Patient states she has a knot on her antecubital area, very close to where her IV was located. She states the knot appeared on Saturday & is mildly tender to the touch & is about the size of a quarter or a little larger. She says the area is not bruised. Seperate message sent to PCP reagrding this issue.  Items Reviewed:  Did the pt receive and understand the discharge instructions provided? Yes   Medications obtained and verified? Yes   Other? Yes   Any new allergies since your discharge? Yes States she had a reaction to the iron infusion she was given while she was in the hospital.  Dietary orders reviewed? Yes  Do you have support at home? Yes   Home Care and Equipment/Supplies: Were home health services ordered? no If so, what is the name of the agency? n/a  Has the agency set up a time to come to the patient's home? not applicable Were any new equipment or medical supplies ordered?  No What is the name of the medical supply agency? n/a Were you able to get the supplies/equipment? not applicable Do you have any questions related to the use of the equipment or supplies? n/a  Functional Questionnaire: (I = Independent and D = Dependent) ADLs: I  Bathing/Dressing- I  Meal Prep- I  Eating- I  Maintaining continence- I  Transferring/Ambulation- I  Managing Meds- I  Follow up appointments reviewed:   PCP Hospital f/u appt confirmed? Yes  Scheduled to see Wilfred Lacy on 04/16/20 @ 11:00.  [facility] f/u appt confirmed? Yes  Scheduled to see GI on 04/15/2020 @ 2:30.  Are transportation arrangements needed? No   If their condition worsens, is the pt aware to call PCP or go to the Emergency Dept.? Yes  Was the patient  provided with contact information for the PCP's office or ED? Yes  Was to pt encouraged to call back with questions or concerns? Yes

## 2020-04-13 NOTE — Telephone Encounter (Signed)
Patient notified voiced understanding

## 2020-04-14 LAB — PATHOLOGIST SMEAR REVIEW

## 2020-04-15 ENCOUNTER — Ambulatory Visit (AMBULATORY_SURGERY_CENTER): Payer: Medicare PPO

## 2020-04-15 ENCOUNTER — Other Ambulatory Visit: Payer: Self-pay

## 2020-04-15 VITALS — Ht 64.0 in | Wt 178.0 lb

## 2020-04-15 DIAGNOSIS — D500 Iron deficiency anemia secondary to blood loss (chronic): Secondary | ICD-10-CM

## 2020-04-15 NOTE — Progress Notes (Signed)
Pre visit completed via phone call; Patient verified name, DOB, address;  No egg or soy allergy known to patient  No issues with past sedation with any surgeries or procedures No intubation problems in the past  No FH of Malignant Hyperthermia No diet pills per patient No home 02 use per patient  No blood thinners per patient  Pt denies issues with constipation  No A fib or A flutter  EMMI video via MyChart  COVID 19 guidelines implemented in PV today with Pt and RN  Pt is fully vaccinated for Covid x 2; Coupon given to pt in PV today , Code to Pharmacy and  NO PA's for preps discussed with pt in PV today  Discussed with pt there will be an out-of-pocket cost for prep and that varies from $0 to 70 dollars  Due to the COVID-19 pandemic we are asking patients to follow certain guidelines.   Pt aware of COVID protocols and [facility] guidelines  Patient denies loose teeth, dentures, dental implants, capped or bonded teeth; Patient reports full upper dentures, missing teeth;

## 2020-04-16 ENCOUNTER — Ambulatory Visit: Payer: Medicare PPO | Admitting: Nurse Practitioner

## 2020-04-16 ENCOUNTER — Encounter: Payer: Self-pay | Admitting: Nurse Practitioner

## 2020-04-16 VITALS — BP 120/60 | HR 70 | Temp 97.4°F | Wt 178.4 lb

## 2020-04-16 DIAGNOSIS — T82898A Other specified complication of vascular prosthetic devices, implants and grafts, initial encounter: Secondary | ICD-10-CM | POA: Diagnosis not present

## 2020-04-16 DIAGNOSIS — Z86718 Personal history of other venous thrombosis and embolism: Secondary | ICD-10-CM | POA: Insufficient documentation

## 2020-04-16 DIAGNOSIS — N1832 Chronic kidney disease, stage 3b: Secondary | ICD-10-CM | POA: Diagnosis not present

## 2020-04-16 DIAGNOSIS — I82461 Acute embolism and thrombosis of right calf muscular vein: Secondary | ICD-10-CM | POA: Diagnosis not present

## 2020-04-16 DIAGNOSIS — D500 Iron deficiency anemia secondary to blood loss (chronic): Secondary | ICD-10-CM

## 2020-04-16 DIAGNOSIS — I10 Essential (primary) hypertension: Secondary | ICD-10-CM

## 2020-04-16 LAB — CBC WITH DIFFERENTIAL/PLATELET
Basophils Absolute: 0.1 10*3/uL (ref 0.0–0.1)
Basophils Relative: 1 % (ref 0.0–3.0)
Eosinophils Absolute: 0.9 10*3/uL — ABNORMAL HIGH (ref 0.0–0.7)
Eosinophils Relative: 7 % — ABNORMAL HIGH (ref 0.0–5.0)
HCT: 29.4 % — ABNORMAL LOW (ref 36.0–46.0)
Hemoglobin: 9 g/dL — ABNORMAL LOW (ref 12.0–15.0)
Lymphocytes Relative: 9.4 % — ABNORMAL LOW (ref 12.0–46.0)
Lymphs Abs: 1.2 10*3/uL (ref 0.7–4.0)
MCHC: 30.5 g/dL (ref 30.0–36.0)
MCV: 74.8 fl — ABNORMAL LOW (ref 78.0–100.0)
Monocytes Absolute: 1.1 10*3/uL — ABNORMAL HIGH (ref 0.1–1.0)
Monocytes Relative: 8.6 % (ref 3.0–12.0)
Neutro Abs: 9.2 10*3/uL — ABNORMAL HIGH (ref 1.4–7.7)
Neutrophils Relative %: 74 % (ref 43.0–77.0)
Platelets: 625 10*3/uL — ABNORMAL HIGH (ref 150.0–400.0)
RBC: 3.93 Mil/uL (ref 3.87–5.11)
RDW: 22.6 % — ABNORMAL HIGH (ref 11.5–15.5)
WBC: 12.5 10*3/uL — ABNORMAL HIGH (ref 4.0–10.5)

## 2020-04-16 LAB — COMPREHENSIVE METABOLIC PANEL
ALT: 7 U/L (ref 0–35)
AST: 10 U/L (ref 0–37)
Albumin: 3.5 g/dL (ref 3.5–5.2)
Alkaline Phosphatase: 79 U/L (ref 39–117)
BUN: 21 mg/dL (ref 6–23)
CO2: 25 mEq/L (ref 19–32)
Calcium: 9.7 mg/dL (ref 8.4–10.5)
Chloride: 104 mEq/L (ref 96–112)
Creatinine, Ser: 1.44 mg/dL — ABNORMAL HIGH (ref 0.40–1.20)
GFR: 34.51 mL/min — ABNORMAL LOW (ref >60.00)
Glucose, Bld: 148 mg/dL — ABNORMAL HIGH (ref 70–99)
Potassium: 5.5 mEq/L — ABNORMAL HIGH (ref 3.5–5.1)
Sodium: 136 mEq/L (ref 135–145)
Total Bilirubin: 0.2 mg/dL (ref 0.2–1.2)
Total Protein: 6.3 g/dL (ref 6.0–8.3)

## 2020-04-16 NOTE — Patient Instructions (Addendum)
You will be contacted to schedule appt for repeat venous doppler in 2weeks. Use knee high compression stocking during the day and off at night. Avoid prolonged sitting  Maintain upcoming appt with GI No need for cardiology appt at this time.  Go to lab for blood draw.

## 2020-04-16 NOTE — Assessment & Plan Note (Signed)
Asymptomatic with D-dimer at 0.75mcg Negative CTA chest. No previous leg injury or surgery or immobilization. Unable to recommend anticoagulant at this time due to acute GI bleed.  Repeat venous doppler in 2weeks

## 2020-04-16 NOTE — Assessment & Plan Note (Signed)
Repeat cbc and BMP Current use of oral ferrous sulfate No ABD pain or nausea or weight loss. Upcoming appt with GI for EGD and colonoscopy: 04/23/2020

## 2020-04-16 NOTE — Progress Notes (Signed)
Subjective:  Patient ID: Briana Ochoa, female    DOB: 10/02/1940  Age: 80 y.o. MRN: [id number]  CC: Hospitalization Follow-up (Pt states she has been doing much better since leaving the hospital. Pt states she does have a knot on her right arm above the bend of her elbow x 5 days. Pt states the knot is right above where they placed her IV while in the hospital.)  HPI  TCM completed 04/13/2020 Admission 04/08/2020 Discharge 04/09/2020 Reviewed discharge summary, labs results and radiology reports. Ms. Leyva was refered to the ED after presenting with SOB for several weeks and had positive Ddimer. She was also diagnosed with severe symptomatic anemia and R. LE DVT, but negative PE. 2units of PRBCs was transfused. Anemia was due to chronic blood loss, hence has upcoming appt with GI for EGD and colonoscopy. Today is reports resolved SOB and fatigue.  Acute deep vein thrombosis (DVT) of calf muscle vein of right lower extremity (HCC) Asymptomatic with D-dimer at 0.30mcg Negative CTA chest. No previous leg injury or surgery or immobilization. Unable to recommend anticoagulant at this time due to acute GI bleed.  Repeat venous doppler in 2weeks  Iron deficiency anemia due to chronic blood loss Repeat cbc and BMP Current use of oral ferrous sulfate No ABD pain or nausea or weight loss. Upcoming appt with GI for EGD and colonoscopy: 04/23/2020  Reviewed past Medical, Social and Family history today.  Outpatient Medications Prior to Visit  Medication Sig Dispense Refill  . acetaminophen (TYLENOL) 500 MG tablet Take 1,000 mg by mouth daily.    . ferrous gluconate (FERGON) 324 MG tablet Take 1 tablet (324 mg total) by mouth 2 (two) times daily with a meal. 180 tablet 0  . lisinopril (ZESTRIL) 20 MG tablet TAKE 1 TABLET EVERY DAY 90 tablet 3  . pravastatin (PRAVACHOL) 10 MG tablet Take 1 tablet (10 mg total) by mouth daily. 90 tablet 3  . albuterol (VENTOLIN HFA) 108 (90  Base) MCG/ACT inhaler Inhale 2 puffs into the lungs every 4 (four) hours. (Patient not taking: No sig reported)    . glucose blood (TRUE METRIX BLOOD GLUCOSE TEST) test strip Use as instructed to test blood sugar once daily E08.638 (Patient not taking: No sig reported) 100 each 2   No facility-administered medications prior to visit.    ROS See HPI  Objective:  BP 120/60 (BP Location: Left Arm, Patient Position: Sitting, Cuff Size: Large)   Pulse 70   Temp (!) 97.4 F (36.3 C) (Temporal)   Wt 178 lb 6.4 oz (80.9 kg)   SpO2 98%   BMI 30.62 kg/m   Physical Exam Cardiovascular:     Rate and Rhythm: Normal rate and regular rhythm.     Pulses: Normal pulses.     Heart sounds: Normal heart sounds.  Pulmonary:     Effort: Pulmonary effort is normal.     Breath sounds: Normal breath sounds.  Abdominal:     General: Bowel sounds are normal. There is no distension.     Palpations: Abdomen is soft.     Tenderness: There is no abdominal tenderness. There is no guarding.  Musculoskeletal:        General: No swelling, tenderness or signs of injury.     Right lower leg: No edema.     Left lower leg: No edema.  Skin:    Findings: No erythema.       Neurological:     Mental Status: She is alert  and oriented to person, place, and time.    Assessment & Plan:  This visit occurred during the SARS-CoV-2 public health emergency.  Safety protocols were in place, including screening questions prior to the visit, additional usage of staff PPE, and extensive cleaning of exam room while observing appropriate contact time as indicated for disinfecting solutions.   Maureen was seen today for hospitalization follow-up.  Diagnoses and all orders for this visit:  Iron deficiency anemia due to chronic blood loss -     CBC w/Diff -     Comprehensive metabolic panel  Hematoma of IV site, initial encounter ([place])  Acute deep vein thrombosis (DVT) of calf muscle vein of right lower extremity (HCC) -      Cancel: VAS [place] LOWER EXTREMITY VENOUS (DVT); Future -     VAS [place] LOWER EXTREMITY VENOUS (DVT); Future  Stage 3b chronic kidney disease (HCC) -     CBC w/Diff -     Comprehensive metabolic panel  Primary hypertension -     Comprehensive metabolic panel  Advised to use cold compress on right antecubital to improve hematoma. She is to return to office if she develops any signs of cellulitis.  Problem List Items Addressed This Visit      Cardiovascular and Mediastinum   Acute deep vein thrombosis (DVT) of calf muscle vein of right lower extremity (HCC)    Asymptomatic with D-dimer at 0.66mcg Negative CTA chest. No previous leg injury or surgery or immobilization. Unable to recommend anticoagulant at this time due to acute GI bleed.  Repeat venous doppler in 2weeks      Relevant Orders   VAS [place] LOWER EXTREMITY VENOUS (DVT)   HTN (hypertension)   Relevant Orders   Comprehensive metabolic panel     Genitourinary   CKD (chronic kidney disease) stage 3, GFR 30-59 ml/min (HCC)   Relevant Orders   CBC w/Diff   Comprehensive metabolic panel     Other   Iron deficiency anemia due to chronic blood loss - Primary    Repeat cbc and BMP Current use of oral ferrous sulfate No ABD pain or nausea or weight loss. Upcoming appt with GI for EGD and colonoscopy: 04/23/2020      Relevant Orders   CBC w/Diff   Comprehensive metabolic panel    Other Visit Diagnoses    Hematoma of IV site, initial encounter [facility])          Follow-up: Return in about 3 months (around 07/17/2020) for HTN and DM, hyperlipidemia (fasting).  Wilfred Lacy, NP

## 2020-04-20 NOTE — Addendum Note (Signed)
Addended by: Wilfred Lacy L on: 04/20/2020 11:44 AM   Modules accepted: Orders

## 2020-04-23 ENCOUNTER — Other Ambulatory Visit: Payer: Self-pay

## 2020-04-23 ENCOUNTER — Other Ambulatory Visit (INDEPENDENT_AMBULATORY_CARE_PROVIDER_SITE_OTHER): Payer: Medicare PPO

## 2020-04-23 ENCOUNTER — Encounter: Payer: Self-pay | Admitting: Internal Medicine

## 2020-04-23 ENCOUNTER — Ambulatory Visit (INDEPENDENT_AMBULATORY_CARE_PROVIDER_SITE_OTHER): Payer: Medicare PPO | Admitting: Internal Medicine

## 2020-04-23 VITALS — BP 120/48 | HR 86 | Temp 97.9°F | Resp 15 | Ht 64.0 in | Wt 178.0 lb

## 2020-04-23 DIAGNOSIS — K5730 Diverticulosis of large intestine without perforation or abscess without bleeding: Secondary | ICD-10-CM

## 2020-04-23 DIAGNOSIS — C186 Malignant neoplasm of descending colon: Secondary | ICD-10-CM

## 2020-04-23 DIAGNOSIS — D125 Benign neoplasm of sigmoid colon: Secondary | ICD-10-CM | POA: Diagnosis not present

## 2020-04-23 DIAGNOSIS — K449 Diaphragmatic hernia without obstruction or gangrene: Secondary | ICD-10-CM | POA: Diagnosis not present

## 2020-04-23 DIAGNOSIS — D500 Iron deficiency anemia secondary to blood loss (chronic): Secondary | ICD-10-CM

## 2020-04-23 DIAGNOSIS — D509 Iron deficiency anemia, unspecified: Secondary | ICD-10-CM

## 2020-04-23 DIAGNOSIS — R195 Other fecal abnormalities: Secondary | ICD-10-CM

## 2020-04-23 LAB — CBC WITH DIFFERENTIAL/PLATELET
Basophils Absolute: 0.1 10*3/uL (ref 0.0–0.1)
Basophils Relative: 0.8 % (ref 0.0–3.0)
Eosinophils Absolute: 0.3 10*3/uL (ref 0.0–0.7)
Eosinophils Relative: 3.2 % (ref 0.0–5.0)
HCT: 29.4 % — ABNORMAL LOW (ref 36.0–46.0)
Hemoglobin: 8.9 g/dL — ABNORMAL LOW (ref 12.0–15.0)
Lymphocytes Relative: 8.3 % — ABNORMAL LOW (ref 12.0–46.0)
Lymphs Abs: 0.9 10*3/uL (ref 0.7–4.0)
MCHC: 30.4 g/dL (ref 30.0–36.0)
MCV: 76.4 fl — ABNORMAL LOW (ref 78.0–100.0)
Monocytes Absolute: 1 10*3/uL (ref 0.1–1.0)
Monocytes Relative: 9.4 % (ref 3.0–12.0)
Neutro Abs: 8 10*3/uL — ABNORMAL HIGH (ref 1.4–7.7)
Neutrophils Relative %: 78.3 % — ABNORMAL HIGH (ref 43.0–77.0)
Platelets: 603 10*3/uL — ABNORMAL HIGH (ref 150.0–400.0)
RBC: 3.85 Mil/uL — ABNORMAL LOW (ref 3.87–5.11)
RDW: 24.6 % — ABNORMAL HIGH (ref 11.5–15.5)
WBC: 10.3 10*3/uL (ref 4.0–10.5)

## 2020-04-23 LAB — COMPREHENSIVE METABOLIC PANEL
ALT: 6 U/L (ref 0–35)
AST: 9 U/L (ref 0–37)
Albumin: 3.5 g/dL (ref 3.5–5.2)
Alkaline Phosphatase: 71 U/L (ref 39–117)
BUN: 17 mg/dL (ref 6–23)
CO2: 22 mEq/L (ref 19–32)
Calcium: 9 mg/dL (ref 8.4–10.5)
Chloride: 103 mEq/L (ref 96–112)
Creatinine, Ser: 1.34 mg/dL — ABNORMAL HIGH (ref 0.40–1.20)
GFR: 37.62 mL/min — ABNORMAL LOW (ref >60.00)
Glucose, Bld: 120 mg/dL — ABNORMAL HIGH (ref 70–99)
Potassium: 3.9 mEq/L (ref 3.5–5.1)
Sodium: 136 mEq/L (ref 135–145)
Total Bilirubin: 0.3 mg/dL (ref 0.2–1.2)
Total Protein: 6.6 g/dL (ref 6.0–8.3)

## 2020-04-23 MED ORDER — SODIUM CHLORIDE 0.9 % IV SOLN
500.0000 mL | Freq: Once | INTRAVENOUS | Status: DC
Start: 2020-04-23 — End: 2020-04-23

## 2020-04-23 NOTE — Progress Notes (Signed)
Pt's states no medical or surgical changes since previsit or office visit. 

## 2020-04-23 NOTE — Progress Notes (Signed)
To PACU, VSS. Report to [email or web address] 

## 2020-04-23 NOTE — Patient Instructions (Addendum)
Unfortunately, there is a mass in the colon that must be a cancer.  While this is bad news it does not mean this is not fixable or curable.  We will need more information with labs, CT scans, and you will have surgery and oncology consultations arranged.  I took biopsies and removed a few polyps as well.  The stomach was basically okay I checked with biopsies looking for special infection that can sometimes be linked to low iron.   You will go to the basement for labs today.  My office will arrange CT scans and we will arrange surgery and oncology appointments though we usually wait for the pathology results to do that particularly the oncology appointment.  We have everybody here in [place] that is needed to help you.  I appreciate the opportunity to care for you. Gatha Mayer, MD, [facility]   Handout on polyps & diverticulosis given to you today  Await pathology results on polyps removed and biopsies of mass in colon  You will have labwork done today prior to discharge   You will have a CT scan scheduled - Contrast was given to you today as well as an instruction sheet for taking contrast. Dr Celesta Aver office will call you with details and times for CT scan and times to drink contrast solution.    YOU HAD AN ENDOSCOPIC PROCEDURE TODAY AT [facility]:   Refer to the procedure report that was given to you for any specific questions about what was found during the examination.  If the procedure report does not answer your questions, please call your gastroenterologist to clarify.  If you requested that your care partner not be given the details of your procedure findings, then the procedure report has been included in a sealed envelope for you to review at your convenience later.  YOU SHOULD EXPECT: Some feelings of bloating in the abdomen. Passage of more gas than usual.  Walking can help get rid of the air that was put into your GI tract during the procedure and reduce the  bloating. If you had a lower endoscopy (such as a colonoscopy or flexible sigmoidoscopy) you may notice spotting of blood in your stool or on the toilet paper. If you underwent a bowel prep for your procedure, you may not have a normal bowel movement for a few days.  Please Note:  You might notice some irritation and congestion in your nose or some drainage.  This is from the oxygen used during your procedure.  There is no need for concern and it should clear up in a day or so.  SYMPTOMS TO REPORT IMMEDIATELY:   Following lower endoscopy (colonoscopy or flexible sigmoidoscopy):  Excessive amounts of blood in the stool  Significant tenderness or worsening of abdominal pains  Swelling of the abdomen that is new, acute  Fever of 100F or higher   Following upper endoscopy (EGD)  Vomiting of blood or coffee ground material  New chest pain or pain under the shoulder blades  Painful or persistently difficult swallowing  New shortness of breath  Fever of 100F or higher  Black, tarry-looking stools  For urgent or emergent issues, a gastroenterologist can be reached at any hour by calling [phone]. Do not use MyChart messaging for urgent concerns.    DIET:  We do recommend a small meal at first, but then you may proceed to your regular diet.  Drink plenty of fluids but you should avoid alcoholic beverages for 24  hours.  ACTIVITY:  You should plan to take it easy for the rest of today and you should NOT DRIVE or use heavy machinery until tomorrow (because of the sedation medicines used during the test).    FOLLOW UP: Our staff will call the number listed on your records 48-72 hours following your procedure to check on you and address any questions or concerns that you may have regarding the information given to you following your procedure. If we do not reach you, we will leave a message.  We will attempt to reach you two times.  During this call, we will ask if you have developed any  symptoms of COVID 19. If you develop any symptoms (ie: fever, flu-like symptoms, shortness of breath, cough etc.) before then, please call [phone].  If you test positive for Covid 19 in the 2 weeks post procedure, please call and report this information to [place].    If any biopsies were taken you will be contacted by phone or by letter within the next 1-3 weeks.  Please call us at [phone] if you have not heard about the biopsies in 3 weeks.    SIGNATURES/CONFIDENTIALITY: You and/or your care partner have signed paperwork which will be entered into your electronic medical record.  These signatures attest to the fact that that the information above on your After Visit Summary has been reviewed and is understood.  Full responsibility of the confidentiality of this discharge information lies with you and/or your care-partner.

## 2020-04-23 NOTE — Progress Notes (Signed)
Pt taken to lab prior to d/c by C [facility]. Pt was given contrast solution w/ instruction sheet to fill out when office calls with CT details . Pt has stage 3 kidney failure per pt's daughter therefore told daughter to be sure and mention this as far as drinking contrast after creatinine clearance results back .

## 2020-04-23 NOTE — Op Note (Signed)
[place] Patient Name: Briana Ochoa Procedure Date: 04/23/2020 7:44 AM MRN: [id number] Endoscopist: Gatha Mayer , MD Age: 80 Referring MD:  Date of Birth: 02-26-1940 Gender: Female Account #: [id number] Procedure:                Colonoscopy Indications:              Heme positive stool, Iron deficiency anemia Medicines:                Propofol per Anesthesia, Monitored Anesthesia Care Procedure:                Pre-Anesthesia Assessment:                           - Prior to the procedure, a History and Physical                            was performed, and patient medications and                            allergies were reviewed. The patient's tolerance of                            previous anesthesia was also reviewed. The risks                            and benefits of the procedure and the sedation                            options and risks were discussed with the patient.                            All questions were answered, and informed consent                            was obtained. Prior Anticoagulants: The patient has                            taken no previous anticoagulant or antiplatelet                            agents. ASA Grade Assessment: III - A patient with                            severe systemic disease. After reviewing the risks                            and benefits, the patient was deemed in                            satisfactory condition to undergo the procedure.                           After obtaining informed consent, the colonoscope  was passed under direct vision. Throughout the                            procedure, the patient's blood pressure, pulse, and                            oxygen saturations were monitored continuously. The                            Olympus CF-HQ190L ([id number]) Colonoscope was                            introduced through the anus and advanced to the the                             descending colon. The colonoscopy was somewhat                            difficult due to patient coughing and regurgitatng                            some. Suctioning of mouth eased this. The quality                            of the bowel preparation was adequate. The rectum                            was photographed. Scope In: 7:58:35 AM Scope Out: 8:15:19 AM Total Procedure Duration: 0 hours 16 minutes 44 seconds  Findings:                 The digital rectal exam findings include decreased                            sphincter tone.                           An infiltrative, submucosal and ulcerated partially                            obstructing large mass was found in the proximal                            descending colon. The mass was circumferential.                            This was biopsied with a cold forceps for                            histology. Verification of patient identification                            for the specimen was done. Estimated blood loss was  minimal. Area was tattooed with an injection of 3                            mL of Spot (carbon black).                           Three sessile and semi-pedunculated polyps were                            found in the sigmoid colon. The polyps were 5 to 20                            mm in size. These polyps were removed with a                            piecemeal technique using a cold snare. Resection                            and retrieval were complete. Verification of                            patient identification for the specimen was done.                            Estimated blood loss was minimal.                           Many diverticula were found in the sigmoid colon.                           The exam was otherwise without abnormality on                            direct and retroflexion views. Complications:            No immediate  complications. Estimated Blood Loss:     Estimated blood loss was minimal. Impression:               - Decreased sphincter tone found on digital rectal                            exam.                           - Malignant partially obstructing tumor in the                            proximal descending colon. Biopsied. Tattooed.                           - Three 5 to 20 mm polyps in the sigmoid colon,                            removed piecemeal using a cold snare. Resected and  retrieved.                           - Severe diverticulosis in the sigmoid colon.                           - I left several diminutive descending (proximal to                            tattoo), sigmoid and rectal polyps                           - The examination was otherwise normal on direct                            and retroflexion views. Recommendation:           - Patient has a contact number available for                            emergencies. The signs and symptoms of potential                            delayed complications were discussed with the                            patient. Return to normal activities tomorrow.                            Written discharge instructions were provided to the                            patient.                           - Resume previous diet.                           - Continue present medications.                           - Await pathology results.                           - Labs today - CBC, CEA, CMET                           will schedule colorectal surgery appointments and                            do CT chest abd pelvis - await creatinine to                            schedule that Gatha Mayer, MD 04/23/2020 8:37:45 AM This report has been signed electronically.

## 2020-04-23 NOTE — Progress Notes (Signed)
Called to room to assist during endoscopic procedure.  Patient ID and intended procedure confirmed with present staff. Received instructions for my participation in the procedure from the performing physician.  

## 2020-04-23 NOTE — Op Note (Signed)
[place] Patient Name: Briana Ochoa Procedure Date: 04/23/2020 7:45 AM MRN: [id number] Endoscopist: Gatha Mayer , MD Age: 80 Referring MD:  Date of Birth: Apr 28, 1940 Gender: Female Account #: [id number] Procedure:                Upper GI endoscopy Indications:              Iron deficiency anemia, Heme positive stool Medicines:                Propofol per Anesthesia, Monitored Anesthesia Care Procedure:                Pre-Anesthesia Assessment:                           - Prior to the procedure, a History and Physical                            was performed, and patient medications and                            allergies were reviewed. The patient's tolerance of                            previous anesthesia was also reviewed. The risks                            and benefits of the procedure and the sedation                            options and risks were discussed with the patient.                            All questions were answered, and informed consent                            was obtained. Prior Anticoagulants: The patient has                            taken no previous anticoagulant or antiplatelet                            agents. ASA Grade Assessment: III - A patient with                            severe systemic disease. After reviewing the risks                            and benefits, the patient was deemed in                            satisfactory condition to undergo the procedure.                           After obtaining informed consent, the endoscope was  passed under direct vision. Throughout the                            procedure, the patient's blood pressure, pulse, and                            oxygen saturations were monitored continuously. The                            Endoscope was introduced through the mouth, and                            advanced to the second part of duodenum. The upper                             GI endoscopy was accomplished without difficulty.                            The patient tolerated the procedure well. Scope In: Scope Out: Findings:                 Diffuse moderately erythematous mucosa without                            bleeding was found in the gastric antrum. Biopsies                            were taken with a cold forceps for Helicobacter                            pylori testing using CLOtest. Verification of                            patient identification for the specimen was done.                            Estimated blood loss was minimal.                           A small hiatal hernia was present.                           The gastroesophageal flap valve was visualized                            endoscopically and classified as Hill Grade IV (no                            fold, wide open lumen, hiatal hernia present).                           The exam was otherwise without abnormality.                           The cardia and gastric  fundus were normal on                            retroflexion. Complications:            No immediate complications. Estimated Blood Loss:     Estimated blood loss was minimal. Impression:               - Erythematous mucosa in the antrum. Biopsied.                           - Small hiatal hernia.                           - Gastroesophageal flap valve classified as Hill                            Grade IV (no fold, wide open lumen, hiatal hernia                            present).                           - The examination was otherwise normal. Recommendation:           - Patient has a contact number available for                            emergencies. The signs and symptoms of potential                            delayed complications were discussed with the                            patient. Return to normal activities tomorrow.                            Written discharge instructions were provided  to the                            patient.                           - Resume previous diet.                           - Continue present medications.                           - See the other procedure note for documentation of                            additional recommendations. Gatha Mayer, MD 04/23/2020 8:24:15 AM This report has been signed electronically.

## 2020-04-24 ENCOUNTER — Ambulatory Visit (INDEPENDENT_AMBULATORY_CARE_PROVIDER_SITE_OTHER): Payer: Medicare PPO | Admitting: Urology

## 2020-04-24 ENCOUNTER — Encounter: Payer: Self-pay | Admitting: Urology

## 2020-04-24 ENCOUNTER — Other Ambulatory Visit: Payer: Self-pay

## 2020-04-24 VITALS — BP 93/59 | HR 92 | Temp 98.0°F | Ht 64.0 in | Wt 178.0 lb

## 2020-04-24 DIAGNOSIS — C679 Malignant neoplasm of bladder, unspecified: Secondary | ICD-10-CM

## 2020-04-24 DIAGNOSIS — C186 Malignant neoplasm of descending colon: Secondary | ICD-10-CM

## 2020-04-24 LAB — CEA: CEA: 3.4 ng/mL — ABNORMAL HIGH

## 2020-04-24 LAB — URINALYSIS, ROUTINE W REFLEX MICROSCOPIC
Bilirubin, UA: NEGATIVE
Glucose, UA: NEGATIVE
Leukocytes,UA: NEGATIVE
Nitrite, UA: NEGATIVE
RBC, UA: NEGATIVE
Specific Gravity, UA: >1.03 — ABNORMAL HIGH (ref 1.005–1.030)
Urobilinogen, Ur: 0.2 mg/dL (ref 0.2–1.0)
pH, UA: 5.5 (ref 5.0–7.5)

## 2020-04-24 LAB — HELICOBACTER PYLORI SCREEN-BIOPSY: UREASE: NEGATIVE

## 2020-04-24 MED ORDER — CIPROFLOXACIN HCL 500 MG PO TABS
500.0000 mg | ORAL_TABLET | Freq: Once | ORAL | Status: AC
  Administered 2020-04-24: 500 mg via ORAL

## 2020-04-24 NOTE — Patient Instructions (Signed)
Bladder Cancer  Bladder cancer is a condition in which abnormal tissue (a tumor) grows in the bladder. The bladder is the organ that holds urine. Two tubes (ureters) carry the urine from the kidneys to the bladder. The bladder wall is made of layers of tissue. Cancer that spreads through these layers of the bladder wall becomes more difficult to treat. What are the causes? The cause of this condition is not known. What increases the risk? The following factors may make you more likely to develop this condition:  Smoking.  Working where there are risks (occupational exposures), such as working with rubber, leather, clothing fabric, dyes, chemicals, and paint.  Being 55 years of age or older.  Being female.  Having bladder inflammation that is long-term (chronic).  Having a history of cancer, including: ? A family history of bladder cancer. ? Personal experience with bladder cancer. ? Having had certain treatments for cancer before. These include:  Medicines to kill cancer cells (chemotherapy).  Strong X-ray beams or capsules high in energy to kill cancer cells and shrink tumors (radiation therapy).  Having been exposed to arsenic. This is a chemical element that can poison you. What are the signs or symptoms? Early symptoms of this condition include:  Seeing blood in your urine.  Feeling pain when urinating.  Having infections of your urinary system (urinary tract infections or UTIs) that happen often.  Having to urinate sooner or more often than usual. Later symptoms of this condition include:  Not being able to urinate.  Pain on one side of your lower back.  Loss of appetite.  Weight loss.  Tiredness (fatigue).  Swelling in your feet.  Bone pain. How is this diagnosed? This condition is diagnosed based on:  Your medical history.  A physical exam.  Lab tests, such as urine tests.  Imaging tests.  Your symptoms. You may also have other tests or  procedures done, such as:  A cystoscopy. A narrow tube is inserted into your bladder through the organ that connects your bladder to the outside of your body (urethra). This is done to view the lining of your bladder for tumors.  A biopsy. This procedure involves removing a tissue sample to look at it under a microscope to see if cancer is present. It is important to find out:  How deeply into the bladder wall cancer has grown.  Whether cancer has spread to any other parts of your body. This may require blood tests or imaging tests, such as a CT scan, MRI, bone scan, or X-rays. How is this treated? Your health care provider may recommend one or more types of treatment based on the stage of your cancer. The most common types of treatment are:  Surgery to remove the cancer. Procedures that may be done include: ? Removing a tumor on the inside wall of the bladder (transurethral resection). ? Removing the bladder (cystectomy).  Radiation therapy. This is often used together with chemotherapy.  Chemotherapy.  Immunotherapy. This uses medicines to help your immune system destroy cancer cells. Follow these instructions at home:  Take over-the-counter and prescription medicines only as told by your health care provider.  Eat a healthy diet. Some of your treatments might affect your appetite.  Do not use any products that contain nicotine or tobacco, such as cigarettes, e-cigarettes, and chewing tobacco. If you need help quitting, ask your health care provider.  Consider joining a support group. This may help you learn to cope with the stress of having   bladder cancer.  Tell your cancer care team if you develop side effects. Your team may be able to recommend ways to get relief.  Keep all follow-up visits as told by your health care provider. This is important. Where to find more information  American Cancer Society: www.cancer.org  National Cancer Institute (NCI):  www.cancer.gov Contact a health care provider if:  You have symptoms of a urinary tract infection. These include: ? Fever. ? Chills. ? Weakness. ? Muscle aches. ? Pain in your abdomen. ? Urge to urinate that is stronger and happens more often than usual. ? Burning feeling in the bladder or urethra when you urinate. Get help right away if:  There is blood in your urine.  You cannot urinate.  You have severe pain or other symptoms that do not go away. Summary  Bladder cancer is a condition in which tumors grow in the bladder and cause illness.  This condition is diagnosed based on your medical history, a physical exam, lab tests, imaging tests, and your symptoms.  Your health care provider may recommend one or more types of treatment based on the stage of your cancer.  Consider joining a support group. This may help you learn to cope with the stress of having bladder cancer. This information is not intended to replace advice given to you by your health care provider. Make sure you discuss any questions you have with your health care provider. Document Revised: 10/03/2018 Document Reviewed: 10/03/2018 Elsevier Patient Education  2021 Elsevier Inc.  

## 2020-04-24 NOTE — Progress Notes (Signed)
   04/24/20  CC: followup bladder cancer  HPI: Briana Ochoa is a 80yo here for followup for T1G3 blader cancer. Last recurrence was 04/2018. No worsening LUTS. NO hematuria. Blood pressure (!) 93/59, pulse 92, temperature 98 F (36.7 C), height 5\' 4"  (1.626 m), weight 178 lb (80.7 kg). NED. A&Ox3.   No respiratory distress   Abd soft, NT, ND Normal external genitalia with patent urethral meatus  Cystoscopy Procedure Note  Patient identification was confirmed, informed consent was obtained, and patient was prepped using Betadine solution.  Lidocaine jelly was administered per urethral meatus.    Procedure: - Flexible cystoscope introduced, without any difficulty.   - Thorough search of the bladder revealed:    normal urethral meatus    normal urothelium    no stones    no ulcers     no tumors    no urethral polyps    no trabeculation  - Ureteral orifices were normal in position and appearance.  Post-Procedure: - Patient tolerated the procedure well  Assessment/ Plan:    Return in about 6 months (around 10/25/2020) for cystoscopy.  Nicolette Bang, MD

## 2020-04-24 NOTE — Progress Notes (Signed)
Urological Symptom Review  Patient is experiencing the following symptoms: Frequent urination Hard to postpone urination Get up at night to urinate Leakage of urine   Review of Systems  Gastrointestinal (upper)  : Negative for upper GI symptoms  Gastrointestinal (lower) : Negative for lower GI symptoms  Constitutional : Weight loss Fatigue  Skin: Itching  Eyes: Negative for eye symptoms  Ear/Nose/Throat : Negative for Ear/Nose/Throat symptoms  Hematologic/Lymphatic: Easy bruising  Cardiovascular : Negative for cardiovascular symptoms  Respiratory : Shortness of breath  Endocrine: Negative for endocrine symptoms  Musculoskeletal: Joint pain  Neurological: Negative for neurological symptoms  Psychologic: Negative for psychiatric symptoms

## 2020-04-24 NOTE — Progress Notes (Signed)
I spoke with the patient's daughter and reviewed CT for Monday 04/24/20 1:15 at [facility] CT .  Daughter verbalized understanding of CT instructions. She is also aware she will be contacted directly with appointment date and time for [facility] next week.  Per Lattie Haw at [facility], patient will require a limited dose of IV contrast

## 2020-04-27 ENCOUNTER — Ambulatory Visit (INDEPENDENT_AMBULATORY_CARE_PROVIDER_SITE_OTHER)
Admission: RE | Admit: 2020-04-27 | Discharge: 2020-04-27 | Disposition: A | Discharge status: Home / Self Care | Payer: Medicare PPO | Source: Ambulatory Visit | Attending: Internal Medicine | Admitting: Internal Medicine

## 2020-04-27 ENCOUNTER — Other Ambulatory Visit: Payer: Self-pay

## 2020-04-27 ENCOUNTER — Telehealth: Payer: Self-pay

## 2020-04-27 DIAGNOSIS — D649 Anemia, unspecified: Secondary | ICD-10-CM | POA: Diagnosis not present

## 2020-04-27 DIAGNOSIS — Z85820 Personal history of malignant melanoma of skin: Secondary | ICD-10-CM | POA: Diagnosis not present

## 2020-04-27 DIAGNOSIS — K5730 Diverticulosis of large intestine without perforation or abscess without bleeding: Secondary | ICD-10-CM | POA: Diagnosis not present

## 2020-04-27 DIAGNOSIS — C186 Malignant neoplasm of descending colon: Secondary | ICD-10-CM | POA: Diagnosis not present

## 2020-04-27 DIAGNOSIS — Z853 Personal history of malignant neoplasm of breast: Secondary | ICD-10-CM | POA: Diagnosis not present

## 2020-04-27 MED ORDER — IOHEXOL 300 MG/ML  SOLN
100.0000 mL | Freq: Once | INTRAMUSCULAR | Status: AC | PRN
  Administered 2020-04-27: 75 mL via INTRAVENOUS

## 2020-04-27 NOTE — Telephone Encounter (Signed)
  Follow up Call-  Call back number 04/23/2020  Post procedure Call Back phone  # [phone]  Permission to leave phone message Yes  Some recent data might be hidden     Patient questions:  Do you have a fever, pain , or abdominal swelling? No. Pain Score  0 *  Have you tolerated food without any problems? Yes.    Have you been able to return to your normal activities? Yes.    Do you have any questions about your discharge instructions: Diet   No. Medications  No. Follow up visit  No.  Do you have questions or concerns about your Care? No.  Actions: * If pain score is 4 or above: No action needed, pain <4.  1. Have you developed a fever since your procedure? no  2.   Have you had an respiratory symptoms (SOB or cough) since your procedure? no  3.   Have you tested positive for COVID 19 since your procedure no  4.   Have you had any family members/close contacts diagnosed with the COVID 19 since your procedure?  no   If yes to any of these questions please route to Joylene John, RN and Joella Prince, RN

## 2020-04-28 ENCOUNTER — Other Ambulatory Visit: Payer: Self-pay

## 2020-04-28 ENCOUNTER — Telehealth: Payer: Self-pay

## 2020-04-28 ENCOUNTER — Ambulatory Visit (HOSPITAL_COMMUNITY)
Admission: RE | Admit: 2020-04-28 | Discharge: 2020-04-28 | Disposition: A | Discharge status: Home / Self Care | Payer: Medicare PPO | Source: Ambulatory Visit | Attending: Nurse Practitioner | Admitting: Nurse Practitioner

## 2020-04-28 DIAGNOSIS — I82461 Acute embolism and thrombosis of right calf muscular vein: Secondary | ICD-10-CM | POA: Insufficient documentation

## 2020-04-28 DIAGNOSIS — C186 Malignant neoplasm of descending colon: Secondary | ICD-10-CM

## 2020-04-28 NOTE — Telephone Encounter (Signed)
Manuela Schwartz from [facility] with [facility], saying the DVT has not changed.  Pt still has DVT in  vein.  Pt has left the office already.  Please see message.  Pt' cell phone number [phone]

## 2020-04-29 ENCOUNTER — Other Ambulatory Visit: Payer: Self-pay

## 2020-04-29 NOTE — Progress Notes (Signed)
The proposed treatment discussed in conference is for discussion purposes only and is not a binding recommendation.  The patients have not been physically examined, or presented with their treatment options.  Therefore, final treatment plans cannot be decided.   

## 2020-04-30 ENCOUNTER — Telehealth: Payer: Self-pay | Admitting: Nurse Practitioner

## 2020-04-30 NOTE — Telephone Encounter (Signed)
Called pt 04/30/20 AD, needed to see if pt still needs xray order from 04/07/20 done or if we can cancel order. lvm for pt to call back

## 2020-05-01 ENCOUNTER — Encounter: Payer: Self-pay | Admitting: Nurse Practitioner

## 2020-05-01 NOTE — Telephone Encounter (Signed)
It would seem pts clot/hypercoaguable state is due to colon and bladder CA. Since no evidence of acute GI bleed and more IDA d/t CRC, I think benefit of anti-coagulation outweighs the risk and would recommend eliquis

## 2020-05-01 NOTE — Telephone Encounter (Signed)
Good Afternoon, Hope you are well. Please look at this chart and tell me if you will still recommend an anticoagulant? She has recent acute GI bleed which led to PRBC transfusion, then diagnosed with colon cancer. Thank you

## 2020-05-01 NOTE — Telephone Encounter (Signed)
Thank ypou very much for your input

## 2020-05-02 ENCOUNTER — Telehealth: Payer: Self-pay | Admitting: Nurse Practitioner

## 2020-05-02 NOTE — Telephone Encounter (Signed)
LVM to call office Need to discuss use of eliquis in treatment of DVT.

## 2020-05-04 NOTE — Telephone Encounter (Signed)
Informed Ms. Briana Ochoa about the risk vs benefit of using eliquis to treat current DVT and hypercoagulation state due to colon cancer. She declined starting medication at this time. She has an appt with [place] Surgeon: Dr. Wynetta Emery and will like to discuss further with him. I informed her that I will also reach out to hematology group for a second opinion and let her know their decision. She verbalized understanding.

## 2020-05-05 ENCOUNTER — Other Ambulatory Visit: Payer: Self-pay

## 2020-05-05 ENCOUNTER — Ambulatory Visit (INDEPENDENT_AMBULATORY_CARE_PROVIDER_SITE_OTHER)
Admission: RE | Admit: 2020-05-05 | Discharge: 2020-05-05 | Disposition: A | Discharge status: Home / Self Care | Payer: Medicare PPO | Source: Ambulatory Visit | Attending: Internal Medicine | Admitting: Internal Medicine

## 2020-05-05 DIAGNOSIS — Z01818 Encounter for other preprocedural examination: Secondary | ICD-10-CM | POA: Diagnosis not present

## 2020-05-05 DIAGNOSIS — C189 Malignant neoplasm of colon, unspecified: Secondary | ICD-10-CM | POA: Diagnosis not present

## 2020-05-05 DIAGNOSIS — C186 Malignant neoplasm of descending colon: Secondary | ICD-10-CM | POA: Diagnosis not present

## 2020-05-05 MED ORDER — IOHEXOL 300 MG/ML  SOLN
80.0000 mL | Freq: Once | INTRAMUSCULAR | Status: AC | PRN
  Administered 2020-05-05: 60 mL via INTRAVENOUS

## 2020-05-11 ENCOUNTER — Ambulatory Visit: Payer: Self-pay | Admitting: General Surgery

## 2020-05-11 DIAGNOSIS — C184 Malignant neoplasm of transverse colon: Secondary | ICD-10-CM | POA: Diagnosis not present

## 2020-05-11 NOTE — H&P (Signed)
The patient is a 80 year old female who presents with colorectal cancer. 80 year old female with a history of bladder cancer, chronic kidney disease and diabetes who was admitted to the hospital in early March after complaining of shortness of breath on exertion. She was found to have a hemoglobin of 7 and she was admitted to the hospital. She underwent a colonoscopy and upper endoscopy. She has a recent history of bladder cancer and is in surveillance for this with regular cystoscopies. She underwent a colonoscopy in mid March 2022. This showed a proximal descending colon mass. Biopsies showed adenocarcinoma. The area was tattooed. Several other polyps were removed from the sigmoid colon. She then underwent CT scans of the chest, abdomen and pelvis. One upper lobe nodule was noted, which warrants follow-up chest CT. Her colon mass was seen in the distal transverse colon. There was no evidence of metastatic disease. CEA is 3.4. Patient has a history of smoking but has not smoked since 2009. She denies any cardiac or pulmonary issues.    Past Surgical History Hortencia Conradi, CMA; 05/11/2020 2:14 PM) Aneurysm Repair Breast Biopsy Left. Breast Mass; Local Excision Left. Cataract Surgery Left. Cesarean Section - 1 Colon Polyp Removal - Colonoscopy Gallbladder Surgery - Open Tonsillectomy  Diagnostic Studies History Hortencia Conradi, CMA; 05/11/2020 2:14 PM) Colonoscopy within last year never Mammogram within last year Pap Smear >5 years ago  Allergies (Kheana Marshall-McBride, CMA; 05/11/2020 2:14 PM) No Known Allergies [05/11/2020]: No Known Drug Allergies [10/09/2013]: Allergies Reconciled  Medication History (Kheana Marshall-McBride, CMA; 05/11/2020 2:14 PM) Meclizine HCl (12.5MG  Tablet, Oral) Active. Tylenol (325MG  Tablet, Oral) Active. Hypromellose (Ophthalmic) Specific strength unknown - Active. Accu-Chek Softclix Lancet Dev  Active. Anastrozole (1MG  Tablet, Oral) Active. QC Aspirin (325MG  Tablet, Oral) Active. Ondansetron (4MG  Tablet Disint, Oral as needed) Active. Lisinopril (20MG  Tablet, Oral) Active. Zofran (4MG  Tablet, Oral) Active. Medications Reconciled  Social History Hortencia Conradi, CMA; 05/11/2020 2:14 PM) Caffeine use Coffee, Tea. No alcohol use No drug use Tobacco use Former smoker.  Family History Hortencia Conradi, CMA; 05/11/2020 2:14 PM) Cancer Brother, Mother. Kidney Disease Mother.  Pregnancy / Birth History Hortencia Conradi, CMA; 05/11/2020 2:14 PM) Age at menarche 33 years. Age of menopause 10-55 Gravida 23 Maternal age 80-20 Para 17  Other Problems Hortencia Conradi, CMA; 05/11/2020 2:14 PM) Bladder Problems Breast Cancer Cancer Cerebrovascular Accident Cholelithiasis Colon Cancer Diabetes Mellitus Gastroesophageal Reflux Disease Heart murmur High blood pressure Hypercholesterolemia Lump In Breast Melanoma Pulmonary Embolism / Blood Clot in Legs     Review of Systems (Kheana Marshall-McBride CMA; 05/11/2020 2:14 PM) General Present- Appetite Loss and Fatigue. Not Present- Chills, Fever, Night Sweats, Weight Gain and Weight Loss. Skin Not Present- Change in Wart/Mole, Dryness, Hives, Jaundice, New Lesions, Non-Healing Wounds, Rash and Ulcer. HEENT Present- Hearing Loss, Ringing in the Ears, Seasonal Allergies and Wears glasses/contact lenses. Not Present- Earache, Hoarseness, Nose Bleed, Oral Ulcers, Sinus Pain, Sore Throat, Visual Disturbances and Yellow Eyes. Respiratory Present- Chronic Cough. Not Present- Bloody sputum, Difficulty Breathing, Snoring and Wheezing. Breast Not Present- Breast Mass, Breast Pain, Nipple Discharge and Skin Changes. Cardiovascular Present- Leg Cramps and Shortness of Breath. Not Present- Chest Pain, Difficulty Breathing Lying Down, Palpitations, Rapid Heart Rate and Swelling of  Extremities. Gastrointestinal Present- Abdominal Pain, Excessive gas and Gets full quickly at meals. Not Present- Bloating, Bloody Stool, Change in Bowel Habits, Chronic diarrhea, Constipation, Difficulty Swallowing, Hemorrhoids, Indigestion, Nausea, Rectal Pain and Vomiting. Female Genitourinary Present- Nocturia. Not Present- Frequency, Painful Urination, Pelvic Pain and Urgency.  Musculoskeletal Present- Joint Pain and Joint Stiffness. Not Present- Back Pain, Muscle Pain, Muscle Weakness and Swelling of Extremities. Neurological Present- Numbness. Not Present- Decreased Memory, Fainting, Headaches, Seizures, Tingling, Tremor, Trouble walking and Weakness. Psychiatric Not Present- Anxiety, Bipolar, Change in Sleep Pattern, Depression, Fearful and Frequent crying. Endocrine Not Present- Cold Intolerance, Excessive Hunger, Hair Changes, Heat Intolerance, Hot flashes and New Diabetes. Hematology Present- Easy Bruising. Not Present- Blood Thinners, Excessive bleeding, Gland problems, HIV and Persistent Infections.  Vitals (Kheana Marshall-McBride CMA; 05/11/2020 2:14 PM) 05/11/2020 2:14 PM Weight: 173 lb Height: 64in Body Surface Area: 1.84 m Body Mass Index: 29.7 kg/m  Temp.: 98.20F  Pulse: 100 (Regular)  P.OX: 96% (Room air) BP: 116/68(Sitting, Left Arm, Standard)    Physical Exam   General Mental Status-Alert. General Appearance-Cooperative.  Abdomen Inspection Skin - Scar - Note: Left upper quadrant transverse scar and right upper quadrant Coker incision noted. Lower midline incision noted as well. Tumor palpated in the left upper quadrant just below her rib cage.    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 This is seen on colonoscopy and appears to be in the distal transverse colon. We have discussed proceeding with a robotic-assisted partial colectomy with  intracorporeal anastomosis and intraoperative assessment of vascular perfusion. We have discussed the risk of the surgery in detail including risks to worsening her chronic kidney disease, risk of bleeding and risk of damage to adjacent structures, due to scar tissue from her previous surgeries. I think a robotic approach would be feasible in her. The surgery and anatomy were described to the patient as well as the risks of surgery and the possible complications. These include: Bleeding, deep abdominal infections and possible wound complications such as hernia and infection, damage to adjacent structures, leak of surgical connections, which can lead to other surgeries and possibly an ostomy, possible need for other procedures, such as abscess drains in radiology, possible prolonged hospital stay, possible diarrhea from removal of part of the colon, possible constipation from narcotics, possible bowel, bladder or sexual dysfunction if having rectal surgery, prolonged fatigue/weakness or appetite loss, possible early recurrence of of disease, possible complications of their medical problems such as heart disease or arrhythmias or lung problems, death (less than 1%). I believe the patient understands and wishes to proceed with the surgery.

## 2020-05-11 NOTE — H&P (View-Only) (Signed)
The patient is a 80 year old female who presents with colorectal cancer. 80 year old female with a history of bladder cancer, chronic kidney disease and diabetes who was admitted to the hospital in early March after complaining of shortness of breath on exertion. She was found to have a hemoglobin of 7 and she was admitted to the hospital. She underwent a colonoscopy and upper endoscopy. She has a recent history of bladder cancer and is in surveillance for this with regular cystoscopies. She underwent a colonoscopy in mid March 2022. This showed a proximal descending colon mass. Biopsies showed adenocarcinoma. The area was tattooed. Several other polyps were removed from the sigmoid colon. She then underwent CT scans of the chest, abdomen and pelvis. One upper lobe nodule was noted, which warrants follow-up chest CT. Her colon mass was seen in the distal transverse colon. There was no evidence of metastatic disease. CEA is 3.4. Patient has a history of smoking but has not smoked since 2009. She denies any cardiac or pulmonary issues.    Past Surgical History Hortencia Conradi, CMA; 05/11/2020 2:14 PM) Aneurysm Repair Breast Biopsy Left. Breast Mass; Local Excision Left. Cataract Surgery Left. Cesarean Section - 1 Colon Polyp Removal - Colonoscopy Gallbladder Surgery - Open Tonsillectomy  Diagnostic Studies History Hortencia Conradi, CMA; 05/11/2020 2:14 PM) Colonoscopy within last year never Mammogram within last year Pap Smear >5 years ago  Allergies (Kheana Marshall-McBride, CMA; 05/11/2020 2:14 PM) No Known Allergies [05/11/2020]: No Known Drug Allergies [10/09/2013]: Allergies Reconciled  Medication History (Kheana Marshall-McBride, CMA; 05/11/2020 2:14 PM) Meclizine HCl (12.5MG  Tablet, Oral) Active. Tylenol (325MG  Tablet, Oral) Active. Hypromellose (Ophthalmic) Specific strength unknown - Active. Accu-Chek Softclix Lancet Dev  Active. Anastrozole (1MG  Tablet, Oral) Active. QC Aspirin (325MG  Tablet, Oral) Active. Ondansetron (4MG  Tablet Disint, Oral as needed) Active. Lisinopril (20MG  Tablet, Oral) Active. Zofran (4MG  Tablet, Oral) Active. Medications Reconciled  Social History Hortencia Conradi, CMA; 05/11/2020 2:14 PM) Caffeine use Coffee, Tea. No alcohol use No drug use Tobacco use Former smoker.  Family History Hortencia Conradi, CMA; 05/11/2020 2:14 PM) Cancer Brother, Mother. Kidney Disease Mother.  Pregnancy / Birth History Hortencia Conradi, CMA; 05/11/2020 2:14 PM) Age at menarche 78 years. Age of menopause 68-55 Gravida 28 Maternal age 58-20 Para 24  Other Problems Hortencia Conradi, CMA; 05/11/2020 2:14 PM) Bladder Problems Breast Cancer Cancer Cerebrovascular Accident Cholelithiasis Colon Cancer Diabetes Mellitus Gastroesophageal Reflux Disease Heart murmur High blood pressure Hypercholesterolemia Lump In Breast Melanoma Pulmonary Embolism / Blood Clot in Legs     Review of Systems (Kheana Marshall-McBride CMA; 05/11/2020 2:14 PM) General Present- Appetite Loss and Fatigue. Not Present- Chills, Fever, Night Sweats, Weight Gain and Weight Loss. Skin Not Present- Change in Wart/Mole, Dryness, Hives, Jaundice, New Lesions, Non-Healing Wounds, Rash and Ulcer. HEENT Present- Hearing Loss, Ringing in the Ears, Seasonal Allergies and Wears glasses/contact lenses. Not Present- Earache, Hoarseness, Nose Bleed, Oral Ulcers, Sinus Pain, Sore Throat, Visual Disturbances and Yellow Eyes. Respiratory Present- Chronic Cough. Not Present- Bloody sputum, Difficulty Breathing, Snoring and Wheezing. Breast Not Present- Breast Mass, Breast Pain, Nipple Discharge and Skin Changes. Cardiovascular Present- Leg Cramps and Shortness of Breath. Not Present- Chest Pain, Difficulty Breathing Lying Down, Palpitations, Rapid Heart Rate and Swelling of  Extremities. Gastrointestinal Present- Abdominal Pain, Excessive gas and Gets full quickly at meals. Not Present- Bloating, Bloody Stool, Change in Bowel Habits, Chronic diarrhea, Constipation, Difficulty Swallowing, Hemorrhoids, Indigestion, Nausea, Rectal Pain and Vomiting. Female Genitourinary Present- Nocturia. Not Present- Frequency, Painful Urination, Pelvic Pain and Urgency.  Musculoskeletal Present- Joint Pain and Joint Stiffness. Not Present- Back Pain, Muscle Pain, Muscle Weakness and Swelling of Extremities. Neurological Present- Numbness. Not Present- Decreased Memory, Fainting, Headaches, Seizures, Tingling, Tremor, Trouble walking and Weakness. Psychiatric Not Present- Anxiety, Bipolar, Change in Sleep Pattern, Depression, Fearful and Frequent crying. Endocrine Not Present- Cold Intolerance, Excessive Hunger, Hair Changes, Heat Intolerance, Hot flashes and New Diabetes. Hematology Present- Easy Bruising. Not Present- Blood Thinners, Excessive bleeding, Gland problems, HIV and Persistent Infections.  Vitals (Kheana Marshall-McBride CMA; 05/11/2020 2:14 PM) 05/11/2020 2:14 PM Weight: 173 lb Height: 64in Body Surface Area: 1.84 m Body Mass Index: 29.7 kg/m  Temp.: 98.59F  Pulse: 100 (Regular)  P.OX: 96% (Room air) BP: 116/68(Sitting, Left Arm, Standard)    Physical Exam   General Mental Status-Alert. General Appearance-Cooperative.  Abdomen Inspection Skin - Scar - Note: Left upper quadrant transverse scar and right upper quadrant Coker incision noted. Lower midline incision noted as well. Tumor palpated in the left upper quadrant just below her rib cage.    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 This is seen on colonoscopy and appears to be in the distal transverse colon. We have discussed proceeding with a robotic-assisted partial colectomy with  intracorporeal anastomosis and intraoperative assessment of vascular perfusion. We have discussed the risk of the surgery in detail including risks to worsening her chronic kidney disease, risk of bleeding and risk of damage to adjacent structures, due to scar tissue from her previous surgeries. I think a robotic approach would be feasible in her. The surgery and anatomy were described to the patient as well as the risks of surgery and the possible complications. These include: Bleeding, deep abdominal infections and possible wound complications such as hernia and infection, damage to adjacent structures, leak of surgical connections, which can lead to other surgeries and possibly an ostomy, possible need for other procedures, such as abscess drains in radiology, possible prolonged hospital stay, possible diarrhea from removal of part of the colon, possible constipation from narcotics, possible bowel, bladder or sexual dysfunction if having rectal surgery, prolonged fatigue/weakness or appetite loss, possible early recurrence of of disease, possible complications of their medical problems such as heart disease or arrhythmias or lung problems, death (less than 1%). I believe the patient understands and wishes to proceed with the surgery.

## 2020-05-12 ENCOUNTER — Ambulatory Visit: Payer: Medicare PPO | Admitting: Nurse Practitioner

## 2020-05-12 ENCOUNTER — Telehealth: Payer: Self-pay | Admitting: Nurse Practitioner

## 2020-05-12 DIAGNOSIS — I82461 Acute embolism and thrombosis of right calf muscular vein: Secondary | ICD-10-CM

## 2020-05-12 MED ORDER — ELIQUIS 5 MG PO TABS: ORAL_TABLET | ORAL | 0 refills | Status: DC

## 2020-05-12 NOTE — Telephone Encounter (Signed)
-----   Message from Leighton Ruff, MD sent at 05/11/2020  5:09 PM EDT ----- Regarding: RE: DVT and use of Eliquis? I'm fine to have her start Eliquis as long as she can come off it prior to surgery and it doesn't cause too much colonic bleeding.    Leighton Ruff ----- Message ----- From: Flossie Buffy, NP Sent: 05/11/2020   1:61 PM EDT To: Leighton Ruff, MD Subject: DVT and use of Eliquis?                        Good Afternoon, I reviewed your note from today's preop visit for Briana Ochoa. I just want to make sure we are on same page. With acute LE DVT and hypercoagulation state,I discussed use of eliquis with Briana Ochoa, but she declined stating she wanted to see you first. What is your recommendation on use of eliquis while waiting to be schedule for surgery? Thank you in advance for your collaboration  Wilfred Lacy, NP

## 2020-05-12 NOTE — Telephone Encounter (Signed)
Patient is scheduled for labs on 04/14 @ 3:20. States she will start medication on 04/07.

## 2020-05-12 NOTE — Telephone Encounter (Signed)
Informed Briana Ochoa about Dr. Marcello Moores and Dr. Hansel Starling recommendation to use eliquis in management of acute DVT. I also advised her about the risk of bleeding and signs/symptoms. She is to call the office if present. Also needs to get repeat CBC and BMP 1week after starting medication, She verbalized understanding and agreed with plan.

## 2020-05-13 ENCOUNTER — Telehealth: Payer: Self-pay

## 2020-05-13 NOTE — Telephone Encounter (Signed)
Pt was recently prescribed a blood thinner. It will cost her 300.00 out of pocket. She said Baldo Ash told her if this happened to call back and they could discuss another med.  Thanks

## 2020-05-14 NOTE — Telephone Encounter (Signed)
Please advise 

## 2020-05-14 NOTE — Telephone Encounter (Signed)
Ask pharmacy is completing a PA will help with cost? If yes, start one

## 2020-05-18 ENCOUNTER — Telehealth: Payer: Self-pay | Admitting: Nurse Practitioner

## 2020-05-18 NOTE — Telephone Encounter (Signed)
Pt is wanting a cb concerning her apixaban (ELIQUIS) 5 MG TABS tablet [[id number]. She is afraid she may have an internal bleed. She says because she feels so tired. Please call pt concerning this issue.

## 2020-05-19 NOTE — Telephone Encounter (Signed)
Spoke with patient and she states she is feeling better now she just has been anxious but she has a upcoming appointment and a surgery scheduled for this month. Pt states she has calmed down and no longer has that concern.

## 2020-05-20 NOTE — Progress Notes (Signed)
DUE TO COVID-19 ONLY ONE VISITOR IS ALLOWED TO COME WITH YOU AND STAY IN THE WAITING ROOM ONLY DURING PRE OP AND PROCEDURE DAY OF SURGERY. THE 1 VISITOR  MAY VISIT WITH YOU AFTER SURGERY IN YOUR PRIVATE ROOM DURING VISITING HOURS ONLY!  YOU NEED TO HAVE A COVID 19 TEST ON__4/19/2022 _____ @_______ , THIS TEST MUST BE DONE BEFORE SURGERY,  COVID TESTING SITE [address] [place] [address], IT IS ON THE RIGHT GOING OUT [address] APPROXIMATELY  2 MINUTES PAST [employer] ON THE RIGHT. ONCE YOUR COVID TEST IS COMPLETED,  PLEASE BEGIN THE QUARANTINE INSTRUCTIONS AS OUTLINED IN YOUR HANDOUT.                Briana Ochoa  05/20/2020   Your procedure is scheduled on:   05/29/20  Report to [facility] Main  Entrance   Report to admitting at     1015 AM     Call this number if you have problems the morning of surgery [phone]           REMEMBER: FOLLOW ALL YOUR BOWEL PREP INSTRUCTIONS WITH CLEAR LIQUIDS FROM [place]. DRINK 2 PRESURGERY ENSURE DRINKS THE NIGHT BEFORE SURGERY AT 1000 PM AND 1 PRESURGERY DRINK THE DAY OF THE PROCEDURE 3 HOURS PRIOR TO SCHEDULED SURGERY.  NOTHING BY MOUTH EXCEPT CLEAR LIQUIDS UNTIL THREE HOURS PRIOR TO SCHEDULED SURGERY. PLEASE FINISH PRESURGERY 3RD  ENSURE  DRINK PER SURGEON ORDER 3 HOURS PRIOR TO SCHEDULED SURGERY TIME WHICH NEEDS TO BE COMPLETED AT ___0915am ______.     CLEAR LIQUID DIET   Foods Allowed                                                                      Coffee and tea, regular and decaf                           Plain Jell-O any favor except red or purple                                         Fruit ices (not with fruit pulp)                                      Iced Popsicles                                     Carbonated beverages, regular and diet                                    Cranberry, grape and apple juices Sports drinks like Gatorade Lightly seasoned clear broth  or consume(fat free) Sugar, honey syrup  _____________________________________________________________________      BRUSH YOUR TEETH MORNING OF SURGERY AND RINSE YOUR MOUTH OUT, NO CHEWING GUM CANDY OR MINTS.     Take  these medicines the morning of surgery with A SIP OF WATER:  None   DO NOT TAKE ANY DIABETIC MEDICATIONS DAY OF YOUR SURGERY                               You may not have any metal on your body including hair pins and              piercings  Do not wear jewelry, make-up, lotions, powders or perfumes, deodorant             Do not wear nail polish on your fingernails.  Do not shave  48 hours prior to surgery.              Men may shave face and neck.   Do not bring valuables to the hospital. [place].  Contacts, dentures or bridgework may not be worn into surgery.  Leave suitcase in the car. After surgery it may be brought to your room.                 Please read over the following fact sheets you were given: _____________________________________________________________________  [facility] - Preparing for Surgery Before surgery, you can play an important role.  Because skin is not sterile, your skin needs to be as free of germs as possible.  You can reduce the number of germs on your skin by washing with CHG (chlorahexidine gluconate) soap before surgery.  CHG is an antiseptic cleaner which kills germs and bonds with the skin to continue killing germs even after washing. Please DO NOT use if you have an allergy to CHG or antibacterial soaps.  If your skin becomes reddened/irritated stop using the CHG and inform your nurse when you arrive at Short Stay. Do not shave (including legs and underarms) for at least 48 hours prior to the first CHG shower.  You may shave your face/neck. Please follow these instructions carefully:  1.  Shower with CHG Soap the night before surgery and the  morning of Surgery.  2.  If you choose  to wash your hair, wash your hair first as usual with your  normal  shampoo.  3.  After you shampoo, rinse your hair and body thoroughly to remove the  shampoo.                           4.  Use CHG as you would any other liquid soap.  You can apply chg directly  to the skin and wash                       Gently with a scrungie or clean washcloth.  5.  Apply the CHG Soap to your body ONLY FROM THE NECK DOWN.   Do not use on face/ open                           Wound or open sores. Avoid contact with eyes, ears mouth and genitals (private parts).                       Wash face,  Genitals (private parts) with your normal soap.  6.  Wash thoroughly, paying special attention to the area where your surgery  will be performed.  7.  Thoroughly rinse your body with warm water from the neck down.  8.  DO NOT shower/wash with your normal soap after using and rinsing off  the CHG Soap.                9.  Pat yourself dry with a clean towel.            10.  Wear clean pajamas.            11.  Place clean sheets on your bed the night of your first shower and do not  sleep with pets. Day of Surgery : Do not apply any lotions/deodorants the morning of surgery.  Please wear clean clothes to the hospital/surgery center.  FAILURE TO FOLLOW THESE INSTRUCTIONS MAY RESULT IN THE CANCELLATION OF YOUR SURGERY PATIENT SIGNATURE_________________________________  NURSE SIGNATURE__________________________________  ________________________________________________________________________

## 2020-05-21 ENCOUNTER — Other Ambulatory Visit: Payer: Medicare PPO

## 2020-05-21 ENCOUNTER — Other Ambulatory Visit: Payer: Self-pay

## 2020-05-21 ENCOUNTER — Encounter (HOSPITAL_COMMUNITY): Payer: Self-pay

## 2020-05-21 ENCOUNTER — Emergency Department (HOSPITAL_COMMUNITY): Payer: Medicare PPO

## 2020-05-21 ENCOUNTER — Emergency Department (HOSPITAL_COMMUNITY)
Admission: EM | Admit: 2020-05-21 | Discharge: 2020-05-21 | Disposition: A | Discharge status: Home / Self Care | Payer: Medicare PPO | Attending: Emergency Medicine | Admitting: Emergency Medicine

## 2020-05-21 ENCOUNTER — Telehealth: Payer: Self-pay | Admitting: Nurse Practitioner

## 2020-05-21 DIAGNOSIS — I129 Hypertensive chronic kidney disease with stage 1 through stage 4 chronic kidney disease, or unspecified chronic kidney disease: Secondary | ICD-10-CM | POA: Diagnosis not present

## 2020-05-21 DIAGNOSIS — Z853 Personal history of malignant neoplasm of breast: Secondary | ICD-10-CM | POA: Insufficient documentation

## 2020-05-21 DIAGNOSIS — R5383 Other fatigue: Secondary | ICD-10-CM | POA: Insufficient documentation

## 2020-05-21 DIAGNOSIS — Z85038 Personal history of other malignant neoplasm of large intestine: Secondary | ICD-10-CM | POA: Insufficient documentation

## 2020-05-21 DIAGNOSIS — Z85820 Personal history of malignant melanoma of skin: Secondary | ICD-10-CM | POA: Insufficient documentation

## 2020-05-21 DIAGNOSIS — I10 Essential (primary) hypertension: Secondary | ICD-10-CM | POA: Diagnosis not present

## 2020-05-21 DIAGNOSIS — Z87891 Personal history of nicotine dependence: Secondary | ICD-10-CM | POA: Insufficient documentation

## 2020-05-21 DIAGNOSIS — R42 Dizziness and giddiness: Secondary | ICD-10-CM | POA: Insufficient documentation

## 2020-05-21 DIAGNOSIS — N183 Chronic kidney disease, stage 3 unspecified: Secondary | ICD-10-CM | POA: Diagnosis not present

## 2020-05-21 DIAGNOSIS — E1122 Type 2 diabetes mellitus with diabetic chronic kidney disease: Secondary | ICD-10-CM | POA: Diagnosis not present

## 2020-05-21 DIAGNOSIS — Z7901 Long term (current) use of anticoagulants: Secondary | ICD-10-CM | POA: Insufficient documentation

## 2020-05-21 DIAGNOSIS — K921 Melena: Secondary | ICD-10-CM

## 2020-05-21 DIAGNOSIS — Z8551 Personal history of malignant neoplasm of bladder: Secondary | ICD-10-CM | POA: Insufficient documentation

## 2020-05-21 DIAGNOSIS — Z79899 Other long term (current) drug therapy: Secondary | ICD-10-CM | POA: Insufficient documentation

## 2020-05-21 LAB — TYPE AND SCREEN
ABO/RH(D): O NEG
Antibody Screen: NEGATIVE

## 2020-05-21 LAB — CBC WITH DIFFERENTIAL/PLATELET
Abs Immature Granulocytes: 0.05 10*3/uL (ref 0.00–0.07)
Basophils Absolute: 0.1 10*3/uL (ref 0.0–0.1)
Basophils Relative: 0 %
Eosinophils Absolute: 0.9 10*3/uL — ABNORMAL HIGH (ref 0.0–0.5)
Eosinophils Relative: 7 %
HCT: 30 % — ABNORMAL LOW (ref 36.0–46.0)
Hemoglobin: 9 g/dL — ABNORMAL LOW (ref 12.0–15.0)
Immature Granulocytes: 0 %
Lymphocytes Relative: 11 %
Lymphs Abs: 1.4 10*3/uL (ref 0.7–4.0)
MCH: 25.2 pg — ABNORMAL LOW (ref 26.0–34.0)
MCHC: 30 g/dL (ref 30.0–36.0)
MCV: 84 fL (ref 80.0–100.0)
Monocytes Absolute: 1.3 10*3/uL — ABNORMAL HIGH (ref 0.1–1.0)
Monocytes Relative: 10 %
Neutro Abs: 8.9 10*3/uL — ABNORMAL HIGH (ref 1.7–7.7)
Neutrophils Relative %: 72 %
Platelets: 620 10*3/uL — ABNORMAL HIGH (ref 150–400)
RBC: 3.57 MIL/uL — ABNORMAL LOW (ref 3.87–5.11)
RDW: 23.4 % — ABNORMAL HIGH (ref 11.5–15.5)
WBC: 12.6 10*3/uL — ABNORMAL HIGH (ref 4.0–10.5)
nRBC: 0 % (ref 0.0–0.2)

## 2020-05-21 LAB — BASIC METABOLIC PANEL
Anion gap: 11 (ref 5–15)
BUN: 27 mg/dL — ABNORMAL HIGH (ref 8–23)
CO2: 20 mmol/L — ABNORMAL LOW (ref 22–32)
Calcium: 8.9 mg/dL (ref 8.9–10.3)
Chloride: 109 mmol/L (ref 98–111)
Creatinine, Ser: 1.56 mg/dL — ABNORMAL HIGH (ref 0.44–1.00)
GFR, Estimated: 34 mL/min — ABNORMAL LOW (ref >60)
Glucose, Bld: 135 mg/dL — ABNORMAL HIGH (ref 70–99)
Potassium: 4.7 mmol/L (ref 3.5–5.1)
Sodium: 140 mmol/L (ref 135–145)

## 2020-05-21 LAB — PROTIME-INR
INR: 1.5 — ABNORMAL HIGH (ref 0.8–1.2)
Prothrombin Time: 18.1 seconds — ABNORMAL HIGH (ref 11.4–15.2)

## 2020-05-21 LAB — POC OCCULT BLOOD, ED: Fecal Occult Bld: POSITIVE — AB

## 2020-05-21 LAB — APTT: aPTT: 43 seconds — ABNORMAL HIGH (ref 24–36)

## 2020-05-21 NOTE — Telephone Encounter (Signed)
Patient advised of recommendations and will go to the ER to be evaluated.  I will have them cancel her Lab appt for today. Dm/cma

## 2020-05-21 NOTE — ED Provider Notes (Incomplete)
[facility] DEPT Provider Note   CSN: [id number] Arrival date & time: 05/21/20  1445     History Chief Complaint  Patient presents with  . Blood In Stools    Briana Ochoa is a 80 y.o. female.  HPI     Past Medical History:  Diagnosis Date  . Allergy    seasonal allergies  . Anemia    on meds  . Aortic atherosclerosis ([place])   . Bladder cancer ([place]) 2020  . Bladder tumor   . Bloody diarrhea 09/29/2017  . Breast cancer ([place]) 2015   Left Breast Cancer  . Cataract    bilateral -sx  . CKD (chronic kidney disease), stage III ([place])    DM related  . Diabetes mellitus type 2, diet-controlled (HCC)    diet controlled  . Dyspnea 09/28/2017  . Dysuria   . Fatigue 09/28/2017  . Frequency of urination   . GERD (gastroesophageal reflux disease)    with certain foods  . Grade I diastolic dysfunction 38/46/6599   Noted on ECHO  . Gross hematuria 09/29/2017  . Heart murmur    since rheumatic fever as child  . Hematuria   . History of cardiomegaly 02/12/2005   Mild, noted on CXR  . History of CVA (cerebrovascular accident) 08/05/2014   post op cerebral angiogram, cerebral thrombosis w/ cerebral infarction;  11-02-2017  per pt no residuals  . History of malignant melanoma of skin 12/2015   s/p wide local excision right lower leg (per pt localized)  . History of rheumatic fever as a child   . Hyperlipidemia    on meds  . Hypertension    on meds  . Intracranial aneurysm dx 07/ 2015   paraophthalmic RICA aneurysm /   s/p  Pipeline embolization right ICA 01-09-2014  . Jaundice 09/28/2017  . Malignant neoplasm of central portion of left breast in female, estrogen receptor positive [facility]) oncologist-  dr Lindi Adie   dx 07/ 2015--- DCIS, ER/PR positive-- s/p  left breast lumpectomy 09-19-2013,  completed radiation 11-12-2013,  started arimidex 11-12-2013  . OA (osteoarthritis)    knees, hands, R shoulder  . Osteopenia 10/16/2012   Diffuse   . Personal history of radiation therapy   . 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 Sigmoid diverticulosis   . Weakness 09/28/2017  . Wears dentures    upper  . Wears glasses   . Wears hearing aid in both ears     Patient Active Problem List   Diagnosis Date Noted  . Acute deep vein thrombosis (DVT) of calf muscle vein of right lower extremity ([place]) 04/16/2020  . Iron deficiency anemia due to chronic blood loss 04/09/2020  . Type 2 diabetes mellitus with hyperlipidemia ([place]) 04/09/2020  . Positive D dimer 04/08/2020  . Symptomatic anemia 04/08/2020  . Diabetes mellitus ([place]) 11/19/2018  . Diabetes mellitus type 2, diet-controlled ([place])   . HLD (hyperlipidemia)   . Bladder cancer ([place]) 02/09/2018  . Weakness 09/28/2017  . Hematuria 09/28/2017  . DOE (dyspnea on exertion) 09/28/2017  . Malignant melanoma of lower leg, right ([place]) 12/28/2015  . PVD (peripheral vascular disease) ([place]) 12/01/2014  . Morbid obesity ([place]) 10/28/2014  . Intracranial aneurysm 01/09/2014  . Ductal carcinoma in situ (DCIS) of left breast 08/22/2013  . Right internal carotid artery aneurysm 08/21/2013  . Meningioma, multiple ([place]) 08/21/2013  . CKD (chronic kidney disease) stage 3, GFR 30-59 ml/min (HCC) 07/17/2013  . History of  tobacco abuse 07/17/2013  . HTN (hypertension) 04/16/2013  . OA (osteoarthritis) of knee 04/16/2013    Past Surgical History:  Procedure Laterality Date  . BREAST LUMPECTOMY Left 2015  . BREAST LUMPECTOMY WITH NEEDLE LOCALIZATION Left 09/19/2013   Procedure: LEFT BREAST LUMPECTOMY WITH DOUBLE WIRE BRACKETED  NEEDLE LOCALIZATION;  Surgeon: [name], MD;  Location: [facility];  Service: General;  Laterality: Left;  . CATARACT EXTRACTION W/ INTRAOCULAR LENS  IMPLANT, BILATERAL  06/2015  . [place]  . CHOLECYSTECTOMY OPEN  1970s  . CYSTOSCOPY W/ URETERAL STENT PLACEMENT Left 11/07/2017   Procedure: CYSTOSCOPY WITH RETROGRADE PYELOGRAM/URETERAL STENT  PLACEMENT;  Surgeon: Cleon Gustin, MD;  Location[facility];  Service: Urology;  Laterality: Left;  . CYSTOSCOPY W/ URETERAL STENT PLACEMENT Bilateral 04/27/2018   Procedure: CYSTOSCOPY WITH RETROGRADE PYELOGRAM;  Surgeon: Cleon Gustin, MD;  Location: [facility];  Service: Urology;  Laterality: Bilateral;  . FINGER SURGERY     lt thumb  from dog bite  . IR  NEPHROURETERAL CATH PLACE LEFT  04/28/2018  . MELANOMA EXCISION Right 12/28/2015   Procedure: EXCISION MELANOMA RIGHT LOWER LEG;  Surgeon: Fanny Skates, MD;  Location: [place];  Service: General;  Laterality: Right;  . RADIOLOGY WITH ANESTHESIA N/A 01/09/2014   Procedure: Embolization;  Surgeon: Consuella Lose, MD;  Location: [facility];  Service: Radiology;  Laterality: N/A;  . SPLENECTOMY, TOTAL  2009 approx.   splen injury due to fall off horse  . TONSILLECTOMY  child  . TRANSURETHRAL RESECTION OF BLADDER TUMOR N/A 11/07/2017   Procedure: TRANSURETHRAL RESECTION OF BLADDER TUMOR (TURBT), POSSIBLE STENT PLACEMENT;  Surgeon: Cleon Gustin, MD;  Location: [facility];  Service: Urology;  Laterality: N/A;  . TRANSURETHRAL RESECTION OF BLADDER TUMOR N/A 12/14/2017   Procedure: TRANSURETHRAL RESECTION OF BLADDER TUMOR (TURBT);  Surgeon: Cleon Gustin, MD;  Location: [facility];  Service: Urology;  Laterality: N/A;  30 MINS  . TRANSURETHRAL RESECTION OF BLADDER TUMOR N/A 04/27/2018   Procedure: TRANSURETHRAL RESECTION OF BLADDER TUMOR (TURBT);  Surgeon: Cleon Gustin, MD;  Location: [facility];  Service: Urology;  Laterality: N/A;  32 MINS     OB History   No obstetric history on file.    Obstetric Comments  Menarche age 83 First live birth age 76 4 children last in 50 at which time she has c-section No HRT No breast feeding No birth control        Family History  Problem Relation Age of Onset  . Hypertension  Mother   . Kidney cancer Mother   . Heart attack Paternal Grandfather   . Breast cancer Maternal Aunt 80  . Breast cancer Paternal Aunt 48  . Colon polyps Neg Hx   . Colon cancer Neg Hx   . Esophageal cancer Neg Hx   . Rectal cancer Neg Hx   . Stomach cancer Neg Hx     Social History   Tobacco Use  . Smoking status: Former Smoker    Packs/day: 1.50    Years: 40.00    Pack years: 60.00    Types: Cigarettes    Quit date: 03/06/2007    Years since quitting: 13.2  . Smokeless tobacco: Never Used  Vaping Use  . Vaping Use: Never used  Substance Use Topics  . Alcohol use: No  . Drug use: Never    Home Medications Prior to Admission medications  Medication Sig Start Date End Date Taking? Authorizing Provider  acetaminophen (TYLENOL) 500 MG tablet Take 1,000 mg by mouth daily.    [provider]  apixaban (ELIQUIS) 5 MG TABS tablet Take 10mg  BID x7days, then 5mg  BID continuously, with food Patient taking differently: Take 5-10 mg by mouth See admin instructions. Take 10mg  twice daily x7days, then 5mg  twice daily continuously, with food 05/12/20   Nche, [name], NP  bismuth subsalicylate (PEPTO BISMOL) 262 MG/15ML suspension Take 30 mLs by mouth every 6 (six) hours as needed for diarrhea or loose stools.    [provider]  ferrous gluconate (FERGON) 324 MG tablet Take 1 tablet (324 mg total) by mouth 2 (two) times daily with a meal. 04/09/20   Mariel Aloe, MD  glucose blood (TRUE METRIX BLOOD GLUCOSE TEST) test strip Use as instructed to test blood sugar once daily E08.638 12/02/15   Lucille Passy, MD  lisinopril (ZESTRIL) 20 MG tablet TAKE 1 TABLET EVERY DAY Patient taking differently: Take 20 mg by mouth daily. 11/21/19   Nche, [name], NP  pravastatin (PRAVACHOL) 10 MG tablet Take 1 tablet (10 mg total) by mouth daily. 11/27/19   Nche, [name], NP    Allergies    Feraheme [ferumoxytol]  Review of Systems   Review of Systems   Constitutional: Positive for fatigue. Negative for chills and fever.  Respiratory: Positive for shortness of breath (on exertion). Negative for cough.   Cardiovascular: Negative for chest pain.  Gastrointestinal: Positive for abdominal distention, blood in stool and diarrhea. Negative for abdominal pain, nausea and vomiting.  Genitourinary: Negative for dysuria.  Musculoskeletal: Negative for arthralgias and myalgias.  Skin: Negative for color change and rash.  Neurological: Positive for weakness (Generalized) and light-headedness. Negative for dizziness, syncope and numbness.  All other systems reviewed and are negative.   Physical Exam Updated Vital Signs BP 119/70 (BP Location: Left Arm)   Pulse (!) 102   Temp 97.8 F (36.6 C) (Oral)   Resp 16   SpO2 97%   Physical Exam Vitals and nursing note reviewed. Exam conducted with a chaperone present.  Constitutional:      General: She is not in acute distress.    Appearance: Normal appearance. She is well-developed. She is not ill-appearing, toxic-appearing or diaphoretic.  HENT:     Head: Normocephalic and atraumatic.  Eyes:     General:        Right eye: No discharge.        Left eye: No discharge.  Cardiovascular:     Rate and Rhythm: Regular rhythm. Tachycardia present.     Heart sounds: Normal heart sounds. No murmur heard. No friction rub. No gallop.      Comments: Very mildly tachycardic on arrival Pulmonary:     Effort: Pulmonary effort is normal. No respiratory distress.     Breath sounds: Normal breath sounds. No wheezing or rales.     Comments: Respirations equal and unlabored, patient able to speak in full sentences, lungs clear to auscultation bilaterally  Abdominal:     General: Bowel sounds are normal. There is distension.     Palpations: Abdomen is soft. There is no mass.     Tenderness: There is no abdominal tenderness. There is no guarding.     Comments: Abdomen is soft, mildly distended in the upper  abdomen and patient reports feeling bloated but no tenderness noted throughout, prior surgical scars noted.  Genitourinary:    Comments: Chaperone present  during rectal exam. No bright red blood present but black/maroon stool is present Musculoskeletal:        General: No deformity.     Cervical back: Neck supple.  Skin:    General: Skin is warm and dry.     Capillary Refill: Capillary refill takes less than 2 seconds.  Neurological:     Mental Status: She is alert.     Coordination: Coordination normal.     Comments: Speech is clear, able to follow commands Moves extremities without ataxia, coordination intact  Psychiatric:        Mood and Affect: Mood normal.        Behavior: Behavior normal.     ED Results / Procedures / Treatments   Labs (all labs ordered are listed, but only abnormal results are displayed) Labs Reviewed  BASIC METABOLIC PANEL - Abnormal; Notable for the following components:      Result Value   CO2 20 (*)    Glucose, Bld 135 (*)    BUN 27 (*)    Creatinine, Ser 1.56 (*)    GFR, Estimated 34 (*)    All other components within normal limits  CBC WITH DIFFERENTIAL/PLATELET - Abnormal; Notable for the following components:   WBC 12.6 (*)    RBC 3.57 (*)    Hemoglobin 9.0 (*)    HCT 30.0 (*)    MCH 25.2 (*)    RDW 23.4 (*)    Platelets 620 (*)    All other components within normal limits  PROTIME-INR - Abnormal; Notable for the following components:   Prothrombin Time 18.1 (*)    INR 1.5 (*)    All other components within normal limits  APTT - Abnormal; Notable for the following components:   aPTT 43 (*)    All other components within normal limits  POC OCCULT BLOOD, ED - Abnormal; Notable for the following components:   Fecal Occult Bld POSITIVE (*)    All other components within normal limits  TYPE AND SCREEN    EKG EKG Interpretation  Date/Time:  Thursday May 21 2020 15:43:41 EDT Ventricular Rate:  86 PR Interval:  176 QRS  Duration: 113 QT Interval:  358 QTC Calculation: 429 R Axis:   25 Text Interpretation: Sinus rhythm Borderline intraventricular conduction delay Low voltage, precordial leads Abnormal R-wave progression, early transition since last tracing no significant change Confirmed by Daleen Bo [phone]) on 05/21/2020 3:51:16 PM   Radiology DG Abd Acute W/Chest  Result Date: 05/21/2020 CLINICAL DATA:  80 year old with bloody stools that began last night. Personal history of bladder cancer. EXAM: DG ABDOMEN ACUTE WITH 1 VIEW CHEST COMPARISON:  CT abdomen and pelvis 04/27/2020. Chest x-ray 04/08/2020 and earlier. Acute abdomen series 03/10/2013. FINDINGS: Bowel gas pattern unremarkable without evidence of obstruction or significant ileus. No evidence of free air or significant air-fluid levels on the erect image. Expected stool burden in the colon. High attenuation material in multiple descending colon diverticula, likely residual oral contrast material from the patient's CT on 04/27/2020. Numerous pelvic phleboliths. No visible opaque urinary tract calculi. Cardiac silhouette normal in size, unchanged. Thoracic aorta tortuous and atherosclerotic, unchanged. Lungs clear. Bronchovascular markings normal. Pulmonary vascularity normal. No visible pleural effusions. No pneumothorax. Surgical clips in LEFT breast from prior lumpectomy IMPRESSION: 1. No acute abdominal abnormality. 2. No acute cardiopulmonary disease. Electronically Signed   By: Evangeline Dakin M.D.   On: 05/21/2020 16:37    Procedures Procedures {Remember to document critical care time when appropriate:1}  Medications Ordered in ED Medications - No data to display  ED Course  I have reviewed the triage vital signs and the nursing notes.  Pertinent labs & imaging results that were available during my care of the patient were reviewed by me and considered in my medical decision making (see chart for details).    MDM Rules/Calculators/A&P                           *** Final Clinical Impression(s) / ED Diagnoses Final diagnoses:  None    Rx / DC Orders ED Discharge Orders    None

## 2020-05-21 NOTE — ED Notes (Signed)
Yellow socks placed on patient

## 2020-05-21 NOTE — Discharge Instructions (Signed)
Despite bleeding her hemoglobin is stable today.  Please stop taking blood thinner until after your surgery next week. Please call and discuss with your primary care provider.  Return for increased bleeding, feeling lightheaded or like he may pass out, increasing shortness of breath or weakness or any other new or concerning symptoms.

## 2020-05-21 NOTE — ED Triage Notes (Signed)
Pt arrived via POV, states she noticed bright red  blood in stool this morning. On blood thinners. States hx of same

## 2020-05-21 NOTE — Addendum Note (Signed)
Addended by: Lynnea Ferrier on: 05/21/2020 03:21 PM   Modules accepted: Orders

## 2020-05-21 NOTE — ED Provider Notes (Addendum)
[facility] DEPT Provider Note   CSN: [id number] Arrival date & time: 05/21/20  1445     History Chief Complaint  Patient presents with  . Blood In Stools    Briana Ochoa is a 80 y.o. female.  Briana Ochoa is a 80 y.o. female with a history of hypertension, hyperlipidemia, recently diagnosed colon cancer, GI bleeding, DVT, breast cancer, aneurysm, diabetes, who presents to the ED for evaluation of blood in her stool.  Patient reports that when she got up this morning she had 2 episodes of dark red blood mixed in with her bowel movement.  She reports that her abdomen has felt a bit distended in the upper area which she has not had any abdominal pain.  No vomiting or hematemesis.  No fevers or chills.  Patient reports that in the past month she had an episode of bleeding and anemia requiring blood transfusion and was recently diagnosed with colon cancer, thought to be the source of this bleeding.  She is scheduled for partial colectomy with Dr. Marcello Moores next week.  During this hospitalization she was also found to have a DVT in about a week ago they decided to start her on Eliquis for this.  She had previously been having bleeding but had not been seeing blood in her stool recently until today.  Reports she has felt a bit fatigued and lightheaded but with no syncope.  Denies any chest pain or shortness of breath.  Called her PCP and was directed to the ED for lab work and further evaluation of GI bleeding.        Past Medical History:  Diagnosis Date  . Allergy    seasonal allergies  . Anemia    on meds  . Aortic atherosclerosis ([place])   . Bladder cancer ([place]) 2020  . Bladder tumor   . Bloody diarrhea 09/29/2017  . Breast cancer ([place]) 2015   Left Breast Cancer  . Cataract    bilateral -sx  . CKD (chronic kidney disease), stage III ([place])    DM related  . Colon cancer ([place])   . Diabetes mellitus type 2, diet-controlled (HCC)     diet controlled  . Dyspnea 09/28/2017  . Dysuria   . Family history of adverse reaction to anesthesia    aunt- N/V   . Fatigue 09/28/2017  . Frequency of urination   . Grade I diastolic dysfunction 40/98/1191   Noted on ECHO  . Gross hematuria 09/29/2017  . Heart murmur    since rheumatic fever as child  . Hematuria   . History of cardiomegaly 02/12/2005   Mild, noted on CXR  . History of CVA (cerebrovascular accident) 08/05/2014   post op cerebral angiogram, cerebral thrombosis w/ cerebral infarction;  11-02-2017  per pt no residuals  . History of malignant melanoma of skin 12/2015   s/p wide local excision right lower leg (per pt localized)  . History of rheumatic fever as a child   . Hyperlipidemia    on meds  . Hypertension    on meds  . Intracranial aneurysm dx 07/ 2015   paraophthalmic RICA aneurysm /   s/p  Pipeline embolization right ICA 01-09-2014  . Jaundice 09/28/2017  . Malignant neoplasm of central portion of left breast in female, estrogen receptor positive [facility]) oncologist-  dr Lindi Adie   dx 07/ 2015--- DCIS, ER/PR positive-- s/p  left breast lumpectomy 09-19-2013,  completed radiation 11-12-2013,  started arimidex 11-12-2013  . Melanoma ([place])  righ tleg   . OA (osteoarthritis)    knees, hands, R shoulder  . Osteopenia 10/16/2012   Diffuse  . Personal history of radiation therapy   . 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 Sigmoid diverticulosis   . Weakness 09/28/2017  . Wears dentures    upper  . Wears glasses   . Wears hearing aid in both ears     Patient Active Problem List   Diagnosis Date Noted  . Acute deep vein thrombosis (DVT) of calf muscle vein of right lower extremity ([place]) 04/16/2020  . Iron deficiency anemia due to chronic blood loss 04/09/2020  . Type 2 diabetes mellitus with hyperlipidemia ([place]) 04/09/2020  . Positive D dimer 04/08/2020  . Symptomatic anemia 04/08/2020  . Diabetes mellitus ([place]) 11/19/2018  . Diabetes  mellitus type 2, diet-controlled ([place])   . HLD (hyperlipidemia)   . Bladder cancer ([place]) 02/09/2018  . Weakness 09/28/2017  . Hematuria 09/28/2017  . DOE (dyspnea on exertion) 09/28/2017  . Malignant melanoma of lower leg, right ([place]) 12/28/2015  . PVD (peripheral vascular disease) ([place]) 12/01/2014  . Morbid obesity ([place]) 10/28/2014  . Intracranial aneurysm 01/09/2014  . Ductal carcinoma in situ (DCIS) of left breast 08/22/2013  . Right internal carotid artery aneurysm 08/21/2013  . Meningioma, multiple ([place]) 08/21/2013  . CKD (chronic kidney disease) stage 3, GFR 30-59 ml/min (HCC) 07/17/2013  . History of tobacco abuse 07/17/2013  . HTN (hypertension) 04/16/2013  . OA (osteoarthritis) of knee 04/16/2013    Past Surgical History:  Procedure Laterality Date  . BREAST LUMPECTOMY Left 2015  . BREAST LUMPECTOMY WITH NEEDLE LOCALIZATION Left 09/19/2013   Procedure: LEFT BREAST LUMPECTOMY WITH DOUBLE WIRE BRACKETED  NEEDLE LOCALIZATION;  Surgeon: [name], MD;  Location: [facility];  Service: General;  Laterality: Left;  . CATARACT EXTRACTION W/ INTRAOCULAR LENS  IMPLANT, BILATERAL  06/2015  . [place]  . CHOLECYSTECTOMY OPEN  1970s  . CYSTOSCOPY W/ URETERAL STENT PLACEMENT Left 11/07/2017   Procedure: CYSTOSCOPY WITH RETROGRADE PYELOGRAM/URETERAL STENT PLACEMENT;  Surgeon: Cleon Gustin, MD;  Location[facility];  Service: Urology;  Laterality: Left;  . CYSTOSCOPY W/ URETERAL STENT PLACEMENT Bilateral 04/27/2018   Procedure: CYSTOSCOPY WITH RETROGRADE PYELOGRAM;  Surgeon: Cleon Gustin, MD;  Location: [facility];  Service: Urology;  Laterality: Bilateral;  . FINGER SURGERY     lt thumb  from dog bite  . IR  NEPHROURETERAL CATH PLACE LEFT  04/28/2018  . MELANOMA EXCISION Right 12/28/2015   Procedure: EXCISION MELANOMA RIGHT LOWER LEG;  Surgeon: Fanny Skates, MD;  Location: [facility];  Service: General;   Laterality: Right;  . RADIOLOGY WITH ANESTHESIA N/A 01/09/2014   Procedure: Embolization;  Surgeon: Consuella Lose, MD;  Location: [facility];  Service: Radiology;  Laterality: N/A;  . SPLENECTOMY, TOTAL  2009 approx.   splen injury due to fall off horse  . TONSILLECTOMY  child  . TRANSURETHRAL RESECTION OF BLADDER TUMOR N/A 11/07/2017   Procedure: TRANSURETHRAL RESECTION OF BLADDER TUMOR (TURBT), POSSIBLE STENT PLACEMENT;  Surgeon: Cleon Gustin, MD;  Location: [facility];  Service: Urology;  Laterality: N/A;  . TRANSURETHRAL RESECTION OF BLADDER TUMOR N/A 12/14/2017   Procedure: TRANSURETHRAL RESECTION OF BLADDER TUMOR (TURBT);  Surgeon: Cleon Gustin, MD;  Location: [facility];  Service: Urology;  Laterality: N/A;  30 MINS  . TRANSURETHRAL RESECTION OF BLADDER TUMOR N/A 04/27/2018  Procedure: TRANSURETHRAL RESECTION OF BLADDER TUMOR (TURBT);  Surgeon: Cleon Gustin, MD;  Location: [facility];  Service: Urology;  Laterality: N/A;  71 MINS     OB History   No obstetric history on file.    Obstetric Comments  Menarche age 42 First live birth age 25 4 children last in 64 at which time she has c-section No HRT No breast feeding No birth control        Family History  Problem Relation Age of Onset  . Hypertension Mother   . Kidney cancer Mother   . Heart attack Paternal Grandfather   . Breast cancer Maternal Aunt 80  . Breast cancer Paternal Aunt 82  . Colon polyps Neg Hx   . Colon cancer Neg Hx   . Esophageal cancer Neg Hx   . Rectal cancer Neg Hx   . Stomach cancer Neg Hx     Social History   Tobacco Use  . Smoking status: Former Smoker    Packs/day: 1.50    Years: 40.00    Pack years: 60.00    Types: Cigarettes    Quit date: 03/06/2007    Years since quitting: 13.2  . Smokeless tobacco: Never Used  Vaping Use  . Vaping Use: Never used  Substance Use Topics  . Alcohol use: No  . Drug use: Never     Home Medications Prior to Admission medications   Medication Sig Start Date End Date Taking? Authorizing Provider  acetaminophen (TYLENOL) 500 MG tablet Take 1,000 mg by mouth daily.    [provider]  apixaban (ELIQUIS) 5 MG TABS tablet Take 10mg  BID x7days, then 5mg  BID continuously, with food Patient taking differently: Take 5-10 mg by mouth See admin instructions. Take 10mg  twice daily x7days, then 5mg  twice daily continuously, with food 05/12/20   Nche, [name], NP  bismuth subsalicylate (PEPTO BISMOL) 262 MG/15ML suspension Take 30 mLs by mouth every 6 (six) hours as needed for diarrhea or loose stools.    [provider]  ferrous gluconate (FERGON) 324 MG tablet Take 1 tablet (324 mg total) by mouth 2 (two) times daily with a meal. 04/09/20   Mariel Aloe, MD  glucose blood (TRUE METRIX BLOOD GLUCOSE TEST) test strip Use as instructed to test blood sugar once daily E08.638 12/02/15   Lucille Passy, MD  lisinopril (ZESTRIL) 20 MG tablet TAKE 1 TABLET EVERY DAY Patient taking differently: Take 20 mg by mouth daily. 11/21/19   Nche, [name], NP  pravastatin (PRAVACHOL) 10 MG tablet Take 1 tablet (10 mg total) by mouth daily. 11/27/19   Nche, [name], NP    Allergies    Feraheme [ferumoxytol]  Review of Systems   Review of Systems  Constitutional: Positive for fatigue. Negative for chills and fever.  HENT: Negative.   Respiratory: Positive for shortness of breath (Exertional). Negative for cough.   Cardiovascular: Negative for chest pain.  Gastrointestinal: Positive for abdominal distention, blood in stool and diarrhea. Negative for constipation, nausea and vomiting.  Genitourinary: Negative for dysuria.  Musculoskeletal: Negative for arthralgias and myalgias.  Skin: Negative for color change and rash.  Neurological: Positive for light-headedness. Negative for dizziness, syncope, weakness and numbness.  All other systems reviewed and are  negative.   Physical Exam Updated Vital Signs BP 119/70 (BP Location: Left Arm)   Pulse (!) 102   Temp 97.8 F (36.6 C) (Oral)   Resp 16   SpO2 97%   Physical  Exam Vitals and nursing note reviewed. Exam conducted with a chaperone present.  Constitutional:      General: She is not in acute distress.    Appearance: Normal appearance. She is well-developed. She is not ill-appearing or diaphoretic.     Comments: Well-appearing and in no distress   HENT:     Head: Normocephalic and atraumatic.     Mouth/Throat:     Mouth: Mucous membranes are moist.     Pharynx: Oropharynx is clear.  Eyes:     General:        Right eye: No discharge.        Left eye: No discharge.     Pupils: Pupils are equal, round, and reactive to light.  Cardiovascular:     Rate and Rhythm: Normal rate and regular rhythm.     Heart sounds: Normal heart sounds. No murmur heard. No friction rub. No gallop.   Pulmonary:     Effort: Pulmonary effort is normal. No respiratory distress.     Breath sounds: Normal breath sounds. No wheezing or rales.     Comments: Respirations equal and unlabored, patient able to speak in full sentences, lungs clear to auscultation bilaterally  Abdominal:     General: Bowel sounds are normal.     Palpations: Abdomen is soft. There is no mass.     Tenderness: There is no abdominal tenderness. There is no guarding.     Comments: Abdomen is soft, pt reports feeling a bit distended in upper abdomen, no significant distention noted on exam, all quadrants NTTP, no guarding or peritoneal signs  Genitourinary:    Comments: Chaperone present for rectal exam Dark stool present on exam, no bright red blood present Musculoskeletal:        General: No deformity.     Cervical back: Neck supple.  Skin:    General: Skin is warm and dry.     Capillary Refill: Capillary refill takes less than 2 seconds.  Neurological:     Mental Status: She is alert.     Coordination: Coordination normal.      Comments: Speech is clear, able to follow commands Moves extremities without ataxia, coordination intact  Psychiatric:        Mood and Affect: Mood normal.        Behavior: Behavior normal.     ED Results / Procedures / Treatments   Labs (all labs ordered are listed, but only abnormal results are displayed) Labs Reviewed  BASIC METABOLIC PANEL - Abnormal; Notable for the following components:      Result Value   CO2 20 (*)    Glucose, Bld 135 (*)    BUN 27 (*)    Creatinine, Ser 1.56 (*)    GFR, Estimated 34 (*)    All other components within normal limits  CBC WITH DIFFERENTIAL/PLATELET - Abnormal; Notable for the following components:   WBC 12.6 (*)    RBC 3.57 (*)    Hemoglobin 9.0 (*)    HCT 30.0 (*)    MCH 25.2 (*)    RDW 23.4 (*)    Platelets 620 (*)    Neutro Abs 8.9 (*)    Monocytes Absolute 1.3 (*)    Eosinophils Absolute 0.9 (*)    All other components within normal limits  PROTIME-INR - Abnormal; Notable for the following components:   Prothrombin Time 18.1 (*)    INR 1.5 (*)    All other components within normal limits  APTT - Abnormal;  Notable for the following components:   aPTT 43 (*)    All other components within normal limits  POC OCCULT BLOOD, ED - Abnormal; Notable for the following components:   Fecal Occult Bld POSITIVE (*)    All other components within normal limits  TYPE AND SCREEN    EKG EKG Interpretation  Date/Time:  Thursday May 21 2020 15:43:41 EDT Ventricular Rate:  86 PR Interval:  176 QRS Duration: 113 QT Interval:  358 QTC Calculation: 429 R Axis:   25 Text Interpretation: Sinus rhythm Borderline intraventricular conduction delay Low voltage, precordial leads Abnormal R-wave progression, early transition Confirmed by Merrily Pew [phone]) on 05/23/2020 6:52:28 AM   Radiology DG Abd Acute W/Chest  Result Date: 05/21/2020 CLINICAL DATA:  80 year old with bloody stools that began last night. Personal history of bladder  cancer. EXAM: DG ABDOMEN ACUTE WITH 1 VIEW CHEST COMPARISON:  CT abdomen and pelvis 04/27/2020. Chest x-ray 04/08/2020 and earlier. Acute abdomen series 03/10/2013. FINDINGS: Bowel gas pattern unremarkable without evidence of obstruction or significant ileus. No evidence of free air or significant air-fluid levels on the erect image. Expected stool burden in the colon. High attenuation material in multiple descending colon diverticula, likely residual oral contrast material from the patient's CT on 04/27/2020. Numerous pelvic phleboliths. No visible opaque urinary tract calculi. Cardiac silhouette normal in size, unchanged. Thoracic aorta tortuous and atherosclerotic, unchanged. Lungs clear. Bronchovascular markings normal. Pulmonary vascularity normal. No visible pleural effusions. No pneumothorax. Surgical clips in LEFT breast from prior lumpectomy IMPRESSION: 1. No acute abdominal abnormality. 2. No acute cardiopulmonary disease. Electronically Signed   By: Evangeline Dakin M.D.   On: 05/21/2020 16:37      Procedures Procedures   Medications Ordered in ED Medications - No data to display  ED Course  I have reviewed the triage vital signs and the nursing notes.  Pertinent labs & imaging results that were available during my care of the patient were reviewed by me and considered in my medical decision making (see chart for details).    MDM Rules/Calculators/A&P                         80 y.o. female presents to the ED with complaints of blood in stool and fatigue, this involves an extensive number of treatment options, and is a complaint that carries with it a high risk of complications and morbidity. Pt with known hx of recent GI bleeding in setting of new colon cancer diagnosis, scheduled for partial colectomy next week. Pt was also recently started on eliquis 1 week ago for DVT. Concern for continued/increased bleeding in setting of antocoagulation  On arrival pt is nontoxic, pt mildly  tachycardic but vitals otherwise stable. Exam significant for dark stools on rectal exam, pt is on iron supplementation but could also have some degree of melena.   Additional history obtained from daughter at bedside. Previous records obtained and reviewed via EMR  Lab Tests:  I Ordered, reviewed, and interpreted labs, which included:  CBC: Minimal leukocytosis, Hgb 9.0 at baseline, despite noted bleeding BMP: no significant electrolyte derangements, creatinine slightly elevated but within patients typical range Hemoccult: positive, no BRB present on exam INR:1.5  Imaging Studies ordered:  I ordered imaging studies which included acute abdominal series, I independently visualized and interpreted imaging which showed no acute abnormality, no obstructive bowel gas pattern  ED Course:   Despite episode of bleeding patient's hemoglobin is at baseline and the rest of  workup has been reassuring. No bright red blood present. Will have patient hold Eliquis, she would be stopping it in 2 days regardless in preparation for surgery. Patient offered admission for observation of bleeding and monitoring of hemoglobin, but she would prefer to go home. She has close follow up and resources with PCP, GI and surgery. Provided strict return precautions, pt and daughter express understanding. Discharged home in stable condition.  Patient discussed with Dr. Darl Householder, who saw patient as well and agrees with plan.   Portions of this note were generated with [employer]. Dictation errors may occur despite best attempts at proofreading.   Final Clinical Impression(s) / ED Diagnoses Final diagnoses:  Blood in stool    Rx / DC Orders ED Discharge Orders    None       Jacqlyn Larsen, PA-C 05/27/20 1052    Jacqlyn Larsen, [place] 05/28/20 1359    Drenda Freeze, MD 06/02/20 [phone]

## 2020-05-21 NOTE — Telephone Encounter (Signed)
Spoke to patient and she states that she is having loose stools since starting Iron supplements.  Briana Ochoa recently had her start Eliquis 10 mg 1 BID x 7 days, then to go to 5 mg BID.   She states this morning she saw bright red blood in her stools, so she didn't take then eliquis this morning.   Please review and advise.  Thanks. Dm/cma

## 2020-05-21 NOTE — ED Notes (Signed)
Family at bedside. 

## 2020-05-21 NOTE — Telephone Encounter (Signed)
Patient states that her stool has been loose since starting Iron. She said this morning she saw blood in it. Please advise on what she should do. She has an appointment for labs today at 3:20.   Patients contact info: [phone].

## 2020-05-25 ENCOUNTER — Other Ambulatory Visit (HOSPITAL_COMMUNITY): Payer: Medicare PPO

## 2020-05-26 ENCOUNTER — Other Ambulatory Visit (HOSPITAL_COMMUNITY)
Admission: RE | Admit: 2020-05-26 | Discharge: 2020-05-26 | Disposition: A | Discharge status: Home / Self Care | Payer: Medicare PPO | Source: Ambulatory Visit | Attending: General Surgery | Admitting: General Surgery

## 2020-05-26 ENCOUNTER — Encounter (HOSPITAL_COMMUNITY): Payer: Self-pay

## 2020-05-26 ENCOUNTER — Encounter (HOSPITAL_COMMUNITY)
Admission: RE | Admit: 2020-05-26 | Discharge: 2020-05-26 | Disposition: A | Discharge status: Home / Self Care | Payer: Medicare PPO | Source: Ambulatory Visit | Attending: General Surgery | Admitting: General Surgery

## 2020-05-26 ENCOUNTER — Telehealth: Payer: Self-pay | Admitting: Nurse Practitioner

## 2020-05-26 ENCOUNTER — Other Ambulatory Visit: Payer: Self-pay

## 2020-05-26 DIAGNOSIS — Z8673 Personal history of transient ischemic attack (TIA), and cerebral infarction without residual deficits: Secondary | ICD-10-CM | POA: Diagnosis not present

## 2020-05-26 DIAGNOSIS — Z7901 Long term (current) use of anticoagulants: Secondary | ICD-10-CM | POA: Insufficient documentation

## 2020-05-26 DIAGNOSIS — R238 Other skin changes: Secondary | ICD-10-CM | POA: Diagnosis not present

## 2020-05-26 DIAGNOSIS — N189 Chronic kidney disease, unspecified: Secondary | ICD-10-CM | POA: Diagnosis present

## 2020-05-26 DIAGNOSIS — K5730 Diverticulosis of large intestine without perforation or abscess without bleeding: Secondary | ICD-10-CM | POA: Diagnosis not present

## 2020-05-26 DIAGNOSIS — I82401 Acute embolism and thrombosis of unspecified deep veins of right lower extremity: Secondary | ICD-10-CM | POA: Insufficient documentation

## 2020-05-26 DIAGNOSIS — Z01812 Encounter for preprocedural laboratory examination: Secondary | ICD-10-CM | POA: Insufficient documentation

## 2020-05-26 DIAGNOSIS — K6389 Other specified diseases of intestine: Secondary | ICD-10-CM | POA: Diagnosis not present

## 2020-05-26 DIAGNOSIS — Z20822 Contact with and (suspected) exposure to covid-19: Secondary | ICD-10-CM | POA: Diagnosis present

## 2020-05-26 DIAGNOSIS — D631 Anemia in chronic kidney disease: Secondary | ICD-10-CM | POA: Insufficient documentation

## 2020-05-26 DIAGNOSIS — Z79899 Other long term (current) drug therapy: Secondary | ICD-10-CM | POA: Insufficient documentation

## 2020-05-26 DIAGNOSIS — Z86711 Personal history of pulmonary embolism: Secondary | ICD-10-CM | POA: Diagnosis not present

## 2020-05-26 DIAGNOSIS — Z853 Personal history of malignant neoplasm of breast: Secondary | ICD-10-CM | POA: Diagnosis not present

## 2020-05-26 DIAGNOSIS — Z86718 Personal history of other venous thrombosis and embolism: Secondary | ICD-10-CM | POA: Diagnosis not present

## 2020-05-26 DIAGNOSIS — E7800 Pure hypercholesterolemia, unspecified: Secondary | ICD-10-CM | POA: Diagnosis present

## 2020-05-26 DIAGNOSIS — Z85820 Personal history of malignant melanoma of skin: Secondary | ICD-10-CM | POA: Diagnosis not present

## 2020-05-26 DIAGNOSIS — C189 Malignant neoplasm of colon, unspecified: Secondary | ICD-10-CM | POA: Insufficient documentation

## 2020-05-26 DIAGNOSIS — N183 Chronic kidney disease, stage 3 unspecified: Secondary | ICD-10-CM | POA: Insufficient documentation

## 2020-05-26 DIAGNOSIS — I700 Atherosclerosis of aorta: Secondary | ICD-10-CM | POA: Insufficient documentation

## 2020-05-26 DIAGNOSIS — K660 Peritoneal adhesions (postprocedural) (postinfection): Secondary | ICD-10-CM | POA: Diagnosis present

## 2020-05-26 DIAGNOSIS — J439 Emphysema, unspecified: Secondary | ICD-10-CM | POA: Insufficient documentation

## 2020-05-26 DIAGNOSIS — R531 Weakness: Secondary | ICD-10-CM | POA: Diagnosis not present

## 2020-05-26 DIAGNOSIS — K219 Gastro-esophageal reflux disease without esophagitis: Secondary | ICD-10-CM | POA: Diagnosis present

## 2020-05-26 DIAGNOSIS — I10 Essential (primary) hypertension: Secondary | ICD-10-CM | POA: Insufficient documentation

## 2020-05-26 DIAGNOSIS — Z7982 Long term (current) use of aspirin: Secondary | ICD-10-CM | POA: Diagnosis not present

## 2020-05-26 DIAGNOSIS — Z8551 Personal history of malignant neoplasm of bladder: Secondary | ICD-10-CM | POA: Diagnosis not present

## 2020-05-26 DIAGNOSIS — C185 Malignant neoplasm of splenic flexure: Secondary | ICD-10-CM | POA: Diagnosis not present

## 2020-05-26 DIAGNOSIS — Z87891 Personal history of nicotine dependence: Secondary | ICD-10-CM | POA: Diagnosis not present

## 2020-05-26 DIAGNOSIS — E1122 Type 2 diabetes mellitus with diabetic chronic kidney disease: Secondary | ICD-10-CM | POA: Diagnosis present

## 2020-05-26 DIAGNOSIS — I129 Hypertensive chronic kidney disease with stage 1 through stage 4 chronic kidney disease, or unspecified chronic kidney disease: Secondary | ICD-10-CM | POA: Diagnosis present

## 2020-05-26 DIAGNOSIS — E860 Dehydration: Secondary | ICD-10-CM | POA: Diagnosis present

## 2020-05-26 DIAGNOSIS — D123 Benign neoplasm of transverse colon: Secondary | ICD-10-CM | POA: Diagnosis not present

## 2020-05-26 DIAGNOSIS — C184 Malignant neoplasm of transverse colon: Secondary | ICD-10-CM | POA: Diagnosis present

## 2020-05-26 HISTORY — DX: Malignant melanoma of skin, unspecified: C43.9

## 2020-05-26 HISTORY — DX: Malignant neoplasm of colon, unspecified: C18.9

## 2020-05-26 HISTORY — DX: Family history of other specified conditions: Z84.89

## 2020-05-26 LAB — BASIC METABOLIC PANEL
Anion gap: 10 (ref 5–15)
BUN: 20 mg/dL (ref 8–23)
CO2: 22 mmol/L (ref 22–32)
Calcium: 9.4 mg/dL (ref 8.9–10.3)
Chloride: 108 mmol/L (ref 98–111)
Creatinine, Ser: 1.06 mg/dL — ABNORMAL HIGH (ref 0.44–1.00)
GFR, Estimated: 53 mL/min — ABNORMAL LOW (ref >60)
Glucose, Bld: 90 mg/dL (ref 70–99)
Potassium: 4.5 mmol/L (ref 3.5–5.1)
Sodium: 140 mmol/L (ref 135–145)

## 2020-05-26 LAB — CBC
HCT: 31.1 % — ABNORMAL LOW (ref 36.0–46.0)
Hemoglobin: 9.3 g/dL — ABNORMAL LOW (ref 12.0–15.0)
MCH: 25.9 pg — ABNORMAL LOW (ref 26.0–34.0)
MCHC: 29.9 g/dL — ABNORMAL LOW (ref 30.0–36.0)
MCV: 86.6 fL (ref 80.0–100.0)
Platelets: 665 10*3/uL — ABNORMAL HIGH (ref 150–400)
RBC: 3.59 MIL/uL — ABNORMAL LOW (ref 3.87–5.11)
RDW: 22.5 % — ABNORMAL HIGH (ref 11.5–15.5)
WBC: 13.4 10*3/uL — ABNORMAL HIGH (ref 4.0–10.5)
nRBC: 0 % (ref 0.0–0.2)

## 2020-05-26 LAB — GLUCOSE, CAPILLARY: Glucose-Capillary: 83 mg/dL (ref 70–99)

## 2020-05-26 LAB — SARS CORONAVIRUS 2 (TAT 6-24 HRS): SARS Coronavirus 2: NEGATIVE

## 2020-05-26 NOTE — Progress Notes (Addendum)
Anesthesia Review:  PCP: DR Tamera Reason Smock at [facility] 04/16/20  Cardiologist : Chest x-ray : 05/06/20- CT Chest  04/09/20- Pulm PErf  IView CXR- 04/08/20 EKG : 04/08/20  Echo : Stress test: Cardiac Cath :  Activity level:  Sleep Study/ CPAP : Fasting Blood Sugar :      / Checks Blood Sugar -- times a day:   Blood Thinner/ Instructions /Last Dose: ASA / Instructions/ Last Dose :  Eliquis- last dose on 05/20/2020  In ED on 05/21/20- for fresh blood in stool.  Was not admitted.   DM- type 2 diet controlled Does not check glucose regular per pt  HGBA1C- 04/09/20-6.2 04/10/20- Blood Transfusion  CBC done 05/26/20 routed to DR Marcello Moores.

## 2020-05-26 NOTE — Telephone Encounter (Signed)
Patient is calling to let her provider know that the hospital took her off of her blood thinner. She said that her provider needs to call [facility] to let them know that she was taken off of the blood thinner as well. She is scheduled for surgery on Thursday. Please call her at [phone] if you have any questions.

## 2020-05-26 NOTE — Telephone Encounter (Signed)
I already sent a message to Dr. Marcello Moores

## 2020-05-27 MED ORDER — BUPIVACAINE LIPOSOME 1.3 % IJ SUSP
20.0000 mL | Freq: Once | INTRAMUSCULAR | Status: DC
  Filled 2020-05-27: qty 20

## 2020-05-27 NOTE — Progress Notes (Signed)
Anesthesia Chart Review:   Case: [id number] Date/Time: 05/28/20 1200   Procedure: XI ROBOT ASSISTED LAPAROSCOPIC PARTIAL COLECTOMY (N/A )   Anesthesia type: General   Pre-op diagnosis: COLON CANCER   Location: [facility]OM 03 / [facility]   Surgeons: Leighton Ruff, MD      DISCUSSION:  Pt is 80 years old with hx stroke (2016), HTN, DM, CKD (stage 3), anemia, bladder cancer, breast cancer, recent dx colon cancer  Hospitalized 3/2-04/09/20 for DOE and fatigue secondary to anemia (hgb 7 improved to 8.1 after 1 unit RBCs.  Fecal occult blood positive stool, incidental identification of R DVT (not started on anticoagulant during hospitalization due to GI bleed).   Eliquis started around 4/7 and stopped on 4/13 due to GI bleeding.   - H/H 9.3/31.1. This is consistent with recent prior results. Pt has GI bleeding due to colon cancer. Hgb was 7 on 3/2, increased to 8 with 1 unit RBCs, pt now on iron and hgb has improved to 9.3    VS: BP 126/81   Pulse 78   Temp 36.8 C (Oral)   Resp 16   Ht 5\' 4"  (1.626 m)   Wt 76.2 kg   SpO2 100%   BMI 28.84 kg/m   PROVIDERS: - PCP is Nche, [name], NP   LABS:  - H/H 9.3/31.1. this is consistent with recent prior results. Pt has GI bleeding due to colon cancer. Hgb was 7 on 3/2, increased to 8 with 1 unit RBCs, pt now on iron and hgb has improved to 9.3   (all labs ordered are listed, but only abnormal results are displayed)  Labs Reviewed  BASIC METABOLIC PANEL - Abnormal; Notable for the following components:      Result Value   Creatinine, Ser 1.06 (*)    GFR, Estimated 53 (*)    All other components within normal limits  CBC - Abnormal; Notable for the following components:   WBC 13.4 (*)    RBC 3.59 (*)    Hemoglobin 9.3 (*)    HCT 31.1 (*)    MCH 25.9 (*)    MCHC 29.9 (*)    RDW 22.5 (*)    Platelets 665 (*)    All other components within normal limits  GLUCOSE, CAPILLARY     IMAGES: Acute abd with 1 view chest 05/21/20:  1. No  acute abdominal abnormality. 2. No acute cardiopulmonary disease.  CT chest  05/05/20:  1.  No acute process or evidence of metastatic disease in the chest. 2. Posterior left upper lobe 1.6 cm sub solid pulmonary nodule versus area of scarring. Per consensus criteria, this warrants follow-up with chest CT at 6-12 months.  3. Aortic atherosclerosis (ICD10-I70.0), coronary arteryatherosclerosis and emphysema (ICD10-J43.9). 4. Esophageal air fluid level suggests dysmotility or gastroesophageal reflux.  CT abd/pelvis 04/27/20:  - Bulky annular soft tissue mass involving the distal transverse colon with extension into adjacent pericolonic fat, consistent with primary colon carcinoma. - No evidence of metastatic disease. - Colonic diverticulosis. No radiographic evidence of diverticulitis. - Aortic Atherosclerosis   EKG 05/21/20:  Sinus rhythm. Borderline intraventricular conduction delay. Low voltage, precordial leads. Abnormal R-wave progression, early transition   CV: Carotid duplex 04/15/15:  - Negative for hemodynamically significant stenosis involving extracranial carotid and vertebral arteries bilaterally   Echo 08/06/14:  - Left ventricle: The cavity size was normal. There was mild focal basal hypertrophy of the septum. Systolic function was normal. Wall motion was normal; there were no regional  wall motion abnormalities. Doppler parameters are consistent with abnormal left ventricular relaxation (grade 1 diastolic dysfunction). There was no evidence of elevated ventricular filling pressure by Doppler parameters.  - Aortic valve: There was no regurgitation.  - Aortic root: The aortic root was normal in size.  - Ascending aorta: The ascending aorta was normal in size.  - Mitral valve: Calcified annulus. Mildly thickened leaflets .  - Left atrium: The atrium was normal in size.  - Tricuspid valve: There was no regurgitation.  - Pulmonic valve: There was mild regurgitation.  - Pulmonary  arteries: Systolic pressure was within the normal range.  - Inferior vena cava: The vessel was normal in size.  - Pericardium, extracardiac: A trivial pericardial effusion was  identified.    Past Medical History:  Diagnosis Date  . Allergy    seasonal allergies  . Anemia    on meds  . Aortic atherosclerosis ([place])   . Bladder cancer ([place]) 2020  . Bladder tumor   . Bloody diarrhea 09/29/2017  . Breast cancer ([place]) 2015   Left Breast Cancer  . Cataract    bilateral -sx  . CKD (chronic kidney disease), stage III ([place])    DM related  . Colon cancer ([place])   . Diabetes mellitus type 2, diet-controlled ([facility])    diet controlled  . Dyspnea 09/28/2017  . Dysuria   . Family history of adverse reaction to anesthesia    aunt- N/V   . Fatigue 09/28/2017  . Frequency of urination   . Grade I diastolic dysfunction 93/79/0240   Noted on ECHO  . Gross hematuria 09/29/2017  . Heart murmur    since rheumatic fever as child  . Hematuria   . History of cardiomegaly 02/12/2005   Mild, noted on CXR  . History of CVA (cerebrovascular accident) 08/05/2014   post op cerebral angiogram, cerebral thrombosis w/ cerebral infarction;  11-02-2017  per pt no residuals  . History of malignant melanoma of skin 12/2015   s/p wide local excision right lower leg (per pt localized)  . History of rheumatic fever as a child   . Hyperlipidemia    on meds  . Hypertension    on meds  . Intracranial aneurysm dx 07/ 2015   paraophthalmic RICA aneurysm /   s/p  Pipeline embolization right ICA 01-09-2014  . Jaundice 09/28/2017  . Malignant neoplasm of central portion of left breast in female, estrogen receptor positive [facility]) oncologist-  dr Lindi Adie   dx 07/ 2015--- DCIS, ER/PR positive-- s/p  left breast lumpectomy 09-19-2013,  completed radiation 11-12-2013,  started arimidex 11-12-2013  . Melanoma ([place])    righ tleg   . OA (osteoarthritis)    knees, hands, R shoulder  . Osteopenia 10/16/2012   Diffuse   . Personal history of radiation therapy   . 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 Sigmoid diverticulosis   . Weakness 09/28/2017  . Wears dentures    upper  . Wears glasses   . Wears hearing aid in both ears     Past Surgical History:  Procedure Laterality Date  . BREAST LUMPECTOMY Left 2015  . BREAST LUMPECTOMY WITH NEEDLE LOCALIZATION Left 09/19/2013   Procedure: LEFT BREAST LUMPECTOMY WITH DOUBLE WIRE BRACKETED  NEEDLE LOCALIZATION;  Surgeon: [name], MD;  Location: [place];  Service: General;  Laterality: Left;  . CATARACT EXTRACTION W/ INTRAOCULAR LENS  IMPLANT, BILATERAL  06/2015  . [place]  .  CHOLECYSTECTOMY OPEN  1970s  . CYSTOSCOPY W/ URETERAL STENT PLACEMENT Left 11/07/2017   Procedure: CYSTOSCOPY WITH RETROGRADE PYELOGRAM/URETERAL STENT PLACEMENT;  Surgeon: Cleon Gustin, MD;  Location: [facility];  Service: Urology;  Laterality: Left;  . CYSTOSCOPY W/ URETERAL STENT PLACEMENT Bilateral 04/27/2018   Procedure: CYSTOSCOPY WITH RETROGRADE PYELOGRAM;  Surgeon: Cleon Gustin, MD;  Location: [facility];  Service: Urology;  Laterality: Bilateral;  . FINGER SURGERY     lt thumb  from dog bite  . IR  NEPHROURETERAL CATH PLACE LEFT  04/28/2018  . MELANOMA EXCISION Right 12/28/2015   Procedure: EXCISION MELANOMA RIGHT LOWER LEG;  Surgeon: Fanny Skates, MD;  Location: [place];  Service: General;  Laterality: Right;  . RADIOLOGY WITH ANESTHESIA N/A 01/09/2014   Procedure: Embolization;  Surgeon: Consuella Lose, MD;  Location: [place];  Service: Radiology;  Laterality: N/A;  . SPLENECTOMY, TOTAL  2009 approx.   splen injury due to fall off horse  . TONSILLECTOMY  child  . TRANSURETHRAL RESECTION OF BLADDER TUMOR N/A 11/07/2017   Procedure: TRANSURETHRAL RESECTION OF BLADDER TUMOR (TURBT), POSSIBLE STENT PLACEMENT;  Surgeon: Cleon Gustin, MD;  Location: [facility];   Service: Urology;  Laterality: N/A;  . TRANSURETHRAL RESECTION OF BLADDER TUMOR N/A 12/14/2017   Procedure: TRANSURETHRAL RESECTION OF BLADDER TUMOR (TURBT);  Surgeon: Cleon Gustin, MD;  Location: [facility];  Service: Urology;  Laterality: N/A;  30 MINS  . TRANSURETHRAL RESECTION OF BLADDER TUMOR N/A 04/27/2018   Procedure: TRANSURETHRAL RESECTION OF BLADDER TUMOR (TURBT);  Surgeon: Cleon Gustin, MD;  Location: [facility];  Service: Urology;  Laterality: N/A;  30 MINS    MEDICATIONS: . acetaminophen (TYLENOL) 500 MG tablet  . apixaban (ELIQUIS) 5 MG TABS tablet  . bismuth subsalicylate (PEPTO BISMOL) 262 MG/15ML suspension  . ferrous gluconate (FERGON) 324 MG tablet  . glucose blood (TRUE METRIX BLOOD GLUCOSE TEST) test strip  . lisinopril (ZESTRIL) 20 MG tablet  . pravastatin (PRAVACHOL) 10 MG tablet   No current facility-administered medications for this encounter.   Derrill Memo ON 05/28/2020] bupivacaine liposome (EXPAREL) 1.3 % injection 266 mg    If no changes, I anticipate pt can proceed with surgery as scheduled.   Willeen Cass, PhD, FNP-BC [facility]/Anesthesiology Phone: [phone] 05/27/2020 9:30 AM

## 2020-05-27 NOTE — Anesthesia Preprocedure Evaluation (Addendum)
Anesthesia Evaluation  Patient identified by MRN, date of birth, ID band Patient awake    Reviewed: Allergy & Precautions, NPO status , Patient's Chart, lab work & pertinent test results  Airway Mallampati: II  TM Distance: >3 FB Neck ROM: Full    Dental  (+) Dental Advisory Given, Upper Dentures   Pulmonary former smoker,    Pulmonary exam normal breath sounds clear to auscultation       Cardiovascular hypertension, Pt. on medications (-) angina+ Peripheral Vascular Disease  (-) Past MI Normal cardiovascular exam Rhythm:Regular Rate:Normal     Neuro/Psych paraophthalmic RICA aneurysm s/p Pipeline embolization right ICA 01-09-2014 CVA, No Residual Symptoms negative psych ROS   GI/Hepatic Neg liver ROS, Colon cancer   Endo/Other  diabetes, Type 2  Renal/GU Renal InsufficiencyRenal disease   Bladder cancer    Musculoskeletal  (+) Arthritis ,   Abdominal   Peds  Hematology  (+) Blood dyscrasia, anemia ,   Anesthesia Other Findings Day of surgery medications reviewed with the patient.  Reproductive/Obstetrics                            Anesthesia Physical Anesthesia Plan  ASA: III  Anesthesia Plan: General   Post-op Pain Management:    Induction: Intravenous  PONV Risk Score and Plan: 4 or greater and Dexamethasone, Ondansetron and Treatment may vary due to age or medical condition  Airway Management Planned: Oral ETT  Additional Equipment:   Intra-op Plan:   Post-operative Plan: Extubation in OR  Informed Consent: I have reviewed the patients History and Physical, chart, labs and discussed the procedure including the risks, benefits and alternatives for the proposed anesthesia with the patient or authorized representative who has indicated his/her understanding and acceptance.     Dental advisory given  Plan Discussed with: CRNA  Anesthesia Plan Comments: (2nd PIV after  induction )       Anesthesia Quick Evaluation

## 2020-05-28 ENCOUNTER — Inpatient Hospital Stay (HOSPITAL_COMMUNITY): Payer: Medicare PPO | Admitting: Certified Registered Nurse Anesthetist

## 2020-05-28 ENCOUNTER — Encounter (HOSPITAL_COMMUNITY): Payer: Self-pay | Admitting: General Surgery

## 2020-05-28 ENCOUNTER — Other Ambulatory Visit: Payer: Self-pay

## 2020-05-28 ENCOUNTER — Encounter (HOSPITAL_COMMUNITY)
Admission: RE | Disposition: A | Discharge status: Home / Self Care | Payer: Self-pay | Source: Home / Self Care | Attending: General Surgery

## 2020-05-28 ENCOUNTER — Inpatient Hospital Stay (HOSPITAL_COMMUNITY)
Admission: RE | Admit: 2020-05-28 | Discharge: 2020-06-01 | DRG: 331 | Disposition: A | Discharge status: Home / Self Care | Payer: Medicare PPO | Attending: General Surgery | Admitting: General Surgery

## 2020-05-28 ENCOUNTER — Inpatient Hospital Stay (HOSPITAL_COMMUNITY): Payer: Medicare PPO | Admitting: Physician Assistant

## 2020-05-28 DIAGNOSIS — Z8673 Personal history of transient ischemic attack (TIA), and cerebral infarction without residual deficits: Secondary | ICD-10-CM

## 2020-05-28 DIAGNOSIS — C184 Malignant neoplasm of transverse colon: Principal | ICD-10-CM | POA: Diagnosis present

## 2020-05-28 DIAGNOSIS — K219 Gastro-esophageal reflux disease without esophagitis: Secondary | ICD-10-CM | POA: Diagnosis present

## 2020-05-28 DIAGNOSIS — C186 Malignant neoplasm of descending colon: Secondary | ICD-10-CM | POA: Diagnosis present

## 2020-05-28 DIAGNOSIS — N189 Chronic kidney disease, unspecified: Secondary | ICD-10-CM | POA: Diagnosis present

## 2020-05-28 DIAGNOSIS — Z7982 Long term (current) use of aspirin: Secondary | ICD-10-CM | POA: Diagnosis not present

## 2020-05-28 DIAGNOSIS — E7800 Pure hypercholesterolemia, unspecified: Secondary | ICD-10-CM | POA: Diagnosis present

## 2020-05-28 DIAGNOSIS — Z20822 Contact with and (suspected) exposure to covid-19: Secondary | ICD-10-CM | POA: Diagnosis present

## 2020-05-28 DIAGNOSIS — Z853 Personal history of malignant neoplasm of breast: Secondary | ICD-10-CM | POA: Diagnosis not present

## 2020-05-28 DIAGNOSIS — Z79899 Other long term (current) drug therapy: Secondary | ICD-10-CM

## 2020-05-28 DIAGNOSIS — Z86711 Personal history of pulmonary embolism: Secondary | ICD-10-CM

## 2020-05-28 DIAGNOSIS — E1122 Type 2 diabetes mellitus with diabetic chronic kidney disease: Secondary | ICD-10-CM | POA: Diagnosis present

## 2020-05-28 DIAGNOSIS — K660 Peritoneal adhesions (postprocedural) (postinfection): Secondary | ICD-10-CM | POA: Diagnosis present

## 2020-05-28 DIAGNOSIS — Z86718 Personal history of other venous thrombosis and embolism: Secondary | ICD-10-CM

## 2020-05-28 DIAGNOSIS — E860 Dehydration: Secondary | ICD-10-CM | POA: Diagnosis present

## 2020-05-28 DIAGNOSIS — Z8551 Personal history of malignant neoplasm of bladder: Secondary | ICD-10-CM

## 2020-05-28 DIAGNOSIS — Z87891 Personal history of nicotine dependence: Secondary | ICD-10-CM | POA: Diagnosis not present

## 2020-05-28 DIAGNOSIS — I129 Hypertensive chronic kidney disease with stage 1 through stage 4 chronic kidney disease, or unspecified chronic kidney disease: Secondary | ICD-10-CM | POA: Diagnosis present

## 2020-05-28 DIAGNOSIS — Z85820 Personal history of malignant melanoma of skin: Secondary | ICD-10-CM | POA: Diagnosis not present

## 2020-05-28 DIAGNOSIS — C189 Malignant neoplasm of colon, unspecified: Secondary | ICD-10-CM | POA: Diagnosis present

## 2020-05-28 LAB — TYPE AND SCREEN
ABO/RH(D): O NEG
Antibody Screen: NEGATIVE

## 2020-05-28 SURGERY — COLECTOMY, PARTIAL, ROBOT-ASSISTED, LAPAROSCOPIC
Anesthesia: General

## 2020-05-28 MED ORDER — LACTATED RINGERS IV SOLN: INTRAVENOUS | Status: DC | PRN

## 2020-05-28 MED ORDER — ALUM & MAG HYDROXIDE-SIMETH 200-200-20 MG/5ML PO SUSP: 30.0000 mL | Freq: Four times a day (QID) | ORAL | Status: DC | PRN

## 2020-05-28 MED ORDER — ALVIMOPAN 12 MG PO CAPS
12.0000 mg | ORAL_CAPSULE | ORAL | Status: AC
  Administered 2020-05-28: 12 mg via ORAL
  Filled 2020-05-28: qty 1

## 2020-05-28 MED ORDER — FENTANYL CITRATE (PF) 250 MCG/5ML IJ SOLN
INTRAMUSCULAR | Status: AC
  Filled 2020-05-28: qty 5

## 2020-05-28 MED ORDER — CHLORHEXIDINE GLUCONATE 0.12 % MT SOLN
15.0000 mL | Freq: Once | OROMUCOSAL | Status: AC
  Administered 2020-05-28: 15 mL via OROMUCOSAL

## 2020-05-28 MED ORDER — ONDANSETRON HCL 4 MG/2ML IJ SOLN
4.0000 mg | Freq: Four times a day (QID) | INTRAMUSCULAR | Status: DC | PRN
  Administered 2020-05-28: 4 mg via INTRAVENOUS
  Filled 2020-05-28: qty 2

## 2020-05-28 MED ORDER — ENSURE SURGERY PO LIQD
237.0000 mL | Freq: Two times a day (BID) | ORAL | Status: DC
  Administered 2020-05-29 – 2020-05-31 (×6): 237 mL via ORAL

## 2020-05-28 MED ORDER — HYDROMORPHONE HCL 1 MG/ML IJ SOLN
INTRAMUSCULAR | Status: DC | PRN
  Administered 2020-05-28 (×2): .5 mg via INTRAVENOUS

## 2020-05-28 MED ORDER — DEXAMETHASONE SODIUM PHOSPHATE 10 MG/ML IJ SOLN
INTRAMUSCULAR | Status: AC
  Filled 2020-05-28: qty 1

## 2020-05-28 MED ORDER — PHENYLEPHRINE HCL-NACL 10-0.9 MG/250ML-% IV SOLN
INTRAVENOUS | Status: DC | PRN
  Administered 2020-05-28: 25 ug/min via INTRAVENOUS

## 2020-05-28 MED ORDER — SUGAMMADEX SODIUM 200 MG/2ML IV SOLN
INTRAVENOUS | Status: DC | PRN
  Administered 2020-05-28: 200 mg via INTRAVENOUS

## 2020-05-28 MED ORDER — FENTANYL CITRATE (PF) 100 MCG/2ML IJ SOLN: 25.0000 ug | INTRAMUSCULAR | Status: DC | PRN

## 2020-05-28 MED ORDER — EPHEDRINE SULFATE-NACL 50-0.9 MG/10ML-% IV SOSY
PREFILLED_SYRINGE | INTRAVENOUS | Status: DC | PRN
  Administered 2020-05-28: 15 mg via INTRAVENOUS

## 2020-05-28 MED ORDER — ONDANSETRON HCL 4 MG/2ML IJ SOLN
INTRAMUSCULAR | Status: AC
  Filled 2020-05-28: qty 2

## 2020-05-28 MED ORDER — LIDOCAINE HCL (CARDIAC) PF 100 MG/5ML IV SOSY
PREFILLED_SYRINGE | INTRAVENOUS | Status: DC | PRN
  Administered 2020-05-28: 80 mg via INTRAVENOUS

## 2020-05-28 MED ORDER — ORAL CARE MOUTH RINSE: 15.0000 mL | Freq: Once | OROMUCOSAL | Status: AC

## 2020-05-28 MED ORDER — PROPOFOL 10 MG/ML IV BOLUS
INTRAVENOUS | Status: DC | PRN
  Administered 2020-05-28: 100 mg via INTRAVENOUS
  Administered 2020-05-28: 30 mg via INTRAVENOUS

## 2020-05-28 MED ORDER — SODIUM CHLORIDE 0.9 % IV SOLN
12.5000 mg | Freq: Four times a day (QID) | INTRAVENOUS | Status: DC | PRN
  Filled 2020-05-28 (×2): qty 0.5

## 2020-05-28 MED ORDER — LIDOCAINE 2% (20 MG/ML) 5 ML SYRINGE
INTRAMUSCULAR | Status: AC
  Filled 2020-05-28: qty 5

## 2020-05-28 MED ORDER — GABAPENTIN 300 MG PO CAPS
300.0000 mg | ORAL_CAPSULE | Freq: Two times a day (BID) | ORAL | Status: DC
  Administered 2020-05-28 – 2020-06-01 (×8): 300 mg via ORAL
  Filled 2020-05-28 (×8): qty 1

## 2020-05-28 MED ORDER — GABAPENTIN 300 MG PO CAPS
300.0000 mg | ORAL_CAPSULE | ORAL | Status: AC
  Administered 2020-05-28: 300 mg via ORAL
  Filled 2020-05-28: qty 1

## 2020-05-28 MED ORDER — ENOXAPARIN SODIUM 40 MG/0.4ML ~~LOC~~ SOLN
40.0000 mg | SUBCUTANEOUS | Status: DC
  Administered 2020-05-29: 40 mg via SUBCUTANEOUS
  Filled 2020-05-28: qty 0.4

## 2020-05-28 MED ORDER — SODIUM CHLORIDE 0.9 % IV SOLN
2.0000 g | Freq: Two times a day (BID) | INTRAVENOUS | Status: AC
  Administered 2020-05-29: 2 g via INTRAVENOUS
  Filled 2020-05-28: qty 2

## 2020-05-28 MED ORDER — ONDANSETRON HCL 4 MG/2ML IJ SOLN: 4.0000 mg | Freq: Once | INTRAMUSCULAR | Status: DC | PRN

## 2020-05-28 MED ORDER — PROPOFOL 10 MG/ML IV BOLUS
INTRAVENOUS | Status: AC
  Filled 2020-05-28: qty 20

## 2020-05-28 MED ORDER — LISINOPRIL 20 MG PO TABS: 20.0000 mg | ORAL_TABLET | Freq: Every day | ORAL | Status: DC

## 2020-05-28 MED ORDER — ONDANSETRON HCL 4 MG/2ML IJ SOLN
INTRAMUSCULAR | Status: DC | PRN
  Administered 2020-05-28: 4 mg via INTRAVENOUS

## 2020-05-28 MED ORDER — ONDANSETRON HCL 4 MG PO TABS: 4.0000 mg | ORAL_TABLET | Freq: Four times a day (QID) | ORAL | Status: DC | PRN

## 2020-05-28 MED ORDER — SACCHAROMYCES BOULARDII 250 MG PO CAPS
250.0000 mg | ORAL_CAPSULE | Freq: Two times a day (BID) | ORAL | Status: DC
  Administered 2020-05-28 – 2020-06-01 (×8): 250 mg via ORAL
  Filled 2020-05-28 (×8): qty 1

## 2020-05-28 MED ORDER — HYDROMORPHONE HCL 1 MG/ML IJ SOLN
0.5000 mg | INTRAMUSCULAR | Status: DC | PRN
Start: 2020-05-28 — End: 2020-06-01

## 2020-05-28 MED ORDER — ESMOLOL HCL 100 MG/10ML IV SOLN
INTRAVENOUS | Status: DC | PRN
  Administered 2020-05-28 (×4): 20 mg via INTRAVENOUS

## 2020-05-28 MED ORDER — ENSURE PRE-SURGERY PO LIQD: 296.0000 mL | Freq: Once | ORAL | Status: DC

## 2020-05-28 MED ORDER — ROCURONIUM BROMIDE 10 MG/ML (PF) SYRINGE
PREFILLED_SYRINGE | INTRAVENOUS | Status: AC
  Filled 2020-05-28: qty 10

## 2020-05-28 MED ORDER — BUPIVACAINE LIPOSOME 1.3 % IJ SUSP
INTRAMUSCULAR | Status: DC | PRN
  Administered 2020-05-28: 20 mL

## 2020-05-28 MED ORDER — ALBUMIN HUMAN 5 % IV SOLN: INTRAVENOUS | Status: DC | PRN

## 2020-05-28 MED ORDER — BUPIVACAINE-EPINEPHRINE 0.25% -1:200000 IJ SOLN
INTRAMUSCULAR | Status: DC | PRN
  Administered 2020-05-28: 30 mL

## 2020-05-28 MED ORDER — SODIUM CHLORIDE 0.9 % IV SOLN
2.0000 g | INTRAVENOUS | Status: AC
  Administered 2020-05-28: 2 g via INTRAVENOUS
  Filled 2020-05-28: qty 2

## 2020-05-28 MED ORDER — HEPARIN SODIUM (PORCINE) 5000 UNIT/ML IJ SOLN
5000.0000 [IU] | Freq: Once | INTRAMUSCULAR | Status: AC
  Administered 2020-05-28: 5000 [IU] via SUBCUTANEOUS
  Filled 2020-05-28: qty 1

## 2020-05-28 MED ORDER — ENSURE PRE-SURGERY PO LIQD: 592.0000 mL | Freq: Once | ORAL | Status: DC

## 2020-05-28 MED ORDER — FENTANYL CITRATE (PF) 250 MCG/5ML IJ SOLN
INTRAMUSCULAR | Status: DC | PRN
  Administered 2020-05-28: 75 ug via INTRAVENOUS
  Administered 2020-05-28 (×2): 50 ug via INTRAVENOUS
  Administered 2020-05-28: 25 ug via INTRAVENOUS
  Administered 2020-05-28: 50 ug via INTRAVENOUS

## 2020-05-28 MED ORDER — METOPROLOL TARTRATE 5 MG/5ML IV SOLN
INTRAVENOUS | Status: DC | PRN
  Administered 2020-05-28: 1.5 mg via INTRAVENOUS
  Administered 2020-05-28: 1 mg via INTRAVENOUS
  Administered 2020-05-28: 1.5 mg via INTRAVENOUS

## 2020-05-28 MED ORDER — PHENYLEPHRINE 40 MCG/ML (10ML) SYRINGE FOR IV PUSH (FOR BLOOD PRESSURE SUPPORT)
PREFILLED_SYRINGE | INTRAVENOUS | Status: DC | PRN
  Administered 2020-05-28 (×2): 80 ug via INTRAVENOUS
  Administered 2020-05-28: 120 ug via INTRAVENOUS
  Administered 2020-05-28: 80 ug via INTRAVENOUS
  Administered 2020-05-28: 120 ug via INTRAVENOUS
  Administered 2020-05-28: 80 ug via INTRAVENOUS
  Administered 2020-05-28: 120 ug via INTRAVENOUS

## 2020-05-28 MED ORDER — ROCURONIUM BROMIDE 10 MG/ML (PF) SYRINGE
PREFILLED_SYRINGE | INTRAVENOUS | Status: DC | PRN
  Administered 2020-05-28: 10 mg via INTRAVENOUS
  Administered 2020-05-28: 20 mg via INTRAVENOUS
  Administered 2020-05-28: 10 mg via INTRAVENOUS
  Administered 2020-05-28: 60 mg via INTRAVENOUS

## 2020-05-28 MED ORDER — ESMOLOL HCL 100 MG/10ML IV SOLN
INTRAVENOUS | Status: AC
  Filled 2020-05-28: qty 10

## 2020-05-28 MED ORDER — 0.9 % SODIUM CHLORIDE (POUR BTL) OPTIME
TOPICAL | Status: DC | PRN
  Administered 2020-05-28: 2000 mL

## 2020-05-28 MED ORDER — RINGERS IRRIGATION IR SOLN
Status: DC | PRN
  Administered 2020-05-28: 1

## 2020-05-28 MED ORDER — DEXAMETHASONE SODIUM PHOSPHATE 10 MG/ML IJ SOLN
INTRAMUSCULAR | Status: DC | PRN
  Administered 2020-05-28: 5 mg via INTRAVENOUS

## 2020-05-28 MED ORDER — ALVIMOPAN 12 MG PO CAPS
12.0000 mg | ORAL_CAPSULE | Freq: Two times a day (BID) | ORAL | Status: DC
  Administered 2020-05-29 – 2020-05-30 (×3): 12 mg via ORAL
  Filled 2020-05-28 (×4): qty 1

## 2020-05-28 MED ORDER — ACETAMINOPHEN 500 MG PO TABS
1000.0000 mg | ORAL_TABLET | ORAL | Status: AC
  Administered 2020-05-28: 1000 mg via ORAL
  Filled 2020-05-28: qty 2

## 2020-05-28 MED ORDER — HYDROMORPHONE HCL 2 MG/ML IJ SOLN
INTRAMUSCULAR | Status: AC
  Filled 2020-05-28: qty 1

## 2020-05-28 MED ORDER — LACTATED RINGERS IV SOLN: INTRAVENOUS | Status: DC

## 2020-05-28 MED ORDER — INDOCYANINE GREEN 25 MG IV SOLR
INTRAVENOUS | Status: DC | PRN
  Administered 2020-05-28: 7.5 mg via INTRAVENOUS

## 2020-05-28 MED ORDER — KCL IN DEXTROSE-NACL 20-5-0.45 MEQ/L-%-% IV SOLN
INTRAVENOUS | Status: DC
  Filled 2020-05-28 (×2): qty 1000

## 2020-05-28 MED ORDER — BUPIVACAINE-EPINEPHRINE (PF) 0.25% -1:200000 IJ SOLN
INTRAMUSCULAR | Status: AC
  Filled 2020-05-28: qty 30

## 2020-05-28 MED ORDER — TRAMADOL HCL 50 MG PO TABS
50.0000 mg | ORAL_TABLET | Freq: Four times a day (QID) | ORAL | Status: DC | PRN
  Administered 2020-05-29 – 2020-05-30 (×3): 50 mg via ORAL
  Filled 2020-05-28 (×3): qty 1

## 2020-05-28 SURGICAL SUPPLY — 96 items
ADH SKN CLS APL DERMABOND .7 (GAUZE/BANDAGES/DRESSINGS) ×1
BLADE EXTENDED COATED 6.5IN (ELECTRODE) IMPLANT
CANNULA REDUC XI 12-8 STAPL (CANNULA) ×2
CANNULA REDUCER 12-8 DVNC XI (CANNULA) IMPLANT
CELLS DAT CNTRL 66122 CELL SVR (MISCELLANEOUS) IMPLANT
COVER SURGICAL LIGHT HANDLE (MISCELLANEOUS) ×4 IMPLANT
COVER TIP SHEARS 8 DVNC (MISCELLANEOUS) ×1 IMPLANT
COVER TIP SHEARS 8MM DA VINCI (MISCELLANEOUS) ×2
COVER WAND RF STERILE (DRAPES) ×2 IMPLANT
DECANTER SPIKE VIAL GLASS SM (MISCELLANEOUS) IMPLANT
DERMABOND ADVANCED (GAUZE/BANDAGES/DRESSINGS) ×1
DERMABOND ADVANCED .7 DNX12 (GAUZE/BANDAGES/DRESSINGS) IMPLANT
DRAIN CHANNEL 19F RND (DRAIN) IMPLANT
DRAPE ARM DVNC X/XI (DISPOSABLE) ×4 IMPLANT
DRAPE COLUMN DVNC XI (DISPOSABLE) ×1 IMPLANT
DRAPE DA VINCI XI ARM (DISPOSABLE) ×8
DRAPE DA VINCI XI COLUMN (DISPOSABLE) ×2
DRAPE SURG IRRIG POUCH 19X23 (DRAPES) ×2 IMPLANT
DRSG OPSITE POSTOP 4X10 (GAUZE/BANDAGES/DRESSINGS) IMPLANT
DRSG OPSITE POSTOP 4X6 (GAUZE/BANDAGES/DRESSINGS) IMPLANT
DRSG OPSITE POSTOP 4X8 (GAUZE/BANDAGES/DRESSINGS) ×1 IMPLANT
ELECT PENCIL ROCKER SW 15FT (MISCELLANEOUS) ×2 IMPLANT
ELECT REM PT RETURN 15FT ADLT (MISCELLANEOUS) ×2 IMPLANT
ENDOLOOP SUT PDS II  0 18 (SUTURE)
ENDOLOOP SUT PDS II 0 18 (SUTURE) IMPLANT
EVACUATOR SILICONE 100CC (DRAIN) IMPLANT
GLOVE SURG ENC MOIS LTX SZ6.5 (GLOVE) ×6 IMPLANT
GLOVE SURG UNDER POLY LF SZ7 (GLOVE) ×4 IMPLANT
GOWN STRL REUS W/TWL XL LVL3 (GOWN DISPOSABLE) ×6 IMPLANT
GRASPER SUT TROCAR 14GX15 (MISCELLANEOUS) ×1 IMPLANT
HOLDER FOLEY CATH W/STRAP (MISCELLANEOUS) ×2 IMPLANT
IRRIG SUCT STRYKERFLOW 2 WTIP (MISCELLANEOUS) ×2
IRRIGATION SUCT STRKRFLW 2 WTP (MISCELLANEOUS) ×1 IMPLANT
KIT PROCEDURE DA VINCI SI (MISCELLANEOUS) ×2
KIT PROCEDURE DVNC SI (MISCELLANEOUS) IMPLANT
KIT TURNOVER KIT A (KITS) ×2 IMPLANT
NDL INSUFFLATION 14GA 120MM (NEEDLE) ×1 IMPLANT
NEEDLE INSUFFLATION 14GA 120MM (NEEDLE) ×2 IMPLANT
PACK CARDIOVASCULAR III (CUSTOM PROCEDURE TRAY) ×2 IMPLANT
PACK COLON (CUSTOM PROCEDURE TRAY) ×2 IMPLANT
PAD POSITIONING PINK XL (MISCELLANEOUS) ×2 IMPLANT
PORT LAP GEL ALEXIS MED 5-9CM (MISCELLANEOUS) IMPLANT
RELOAD STAPLE 60 2.5 WHT DVNC (STAPLE) IMPLANT
RELOAD STAPLE 60 3.5 BLU DVNC (STAPLE) IMPLANT
RELOAD STAPLE 60 4.3 GRN DVNC (STAPLE) IMPLANT
RELOAD STAPLER 2.5X60 WHT DVNC (STAPLE) ×2 IMPLANT
RELOAD STAPLER 3.5X60 BLU DVNC (STAPLE) ×2 IMPLANT
RELOAD STAPLER 4.3X60 GRN DVNC (STAPLE) IMPLANT
RETRACTOR WND ALEXIS 18 MED (MISCELLANEOUS) IMPLANT
RTRCTR WOUND ALEXIS 18CM MED (MISCELLANEOUS)
SCISSORS LAP 5X35 DISP (ENDOMECHANICALS) ×1 IMPLANT
SEAL CANN UNIV 5-8 DVNC XI (MISCELLANEOUS) ×3 IMPLANT
SEAL XI 5MM-8MM UNIVERSAL (MISCELLANEOUS) ×6
SEALER VESSEL DA VINCI XI (MISCELLANEOUS) ×2
SEALER VESSEL EXT DVNC XI (MISCELLANEOUS) ×1 IMPLANT
SOLUTION ELECTROLUBE (MISCELLANEOUS) ×2 IMPLANT
STAPLER 60 DA VINCI SURE FORM (STAPLE) ×2
STAPLER 60 SUREFORM DVNC (STAPLE) IMPLANT
STAPLER CANNULA SEAL DVNC XI (STAPLE) IMPLANT
STAPLER CANNULA SEAL XI (STAPLE)
STAPLER ECHELON POWER CIR 29 (STAPLE) IMPLANT
STAPLER ECHELON POWER CIR 31 (STAPLE) IMPLANT
STAPLER RELOAD 2.5X60 WHITE (STAPLE) ×4
STAPLER RELOAD 2.5X60 WHT DVNC (STAPLE) ×2
STAPLER RELOAD 3.5X60 BLU DVNC (STAPLE) ×2
STAPLER RELOAD 3.5X60 BLUE (STAPLE) ×4
STAPLER RELOAD 4.3X60 GREEN (STAPLE)
STAPLER RELOAD 4.3X60 GRN DVNC (STAPLE)
STOPCOCK 4 WAY LG BORE MALE ST (IV SETS) ×4 IMPLANT
SUT ETHILON 2 0 PS N (SUTURE) IMPLANT
SUT NOVA NAB DX-16 0-1 5-0 T12 (SUTURE) ×5 IMPLANT
SUT PROLENE 2 0 KS (SUTURE) IMPLANT
SUT SILK 2 0 (SUTURE) ×2
SUT SILK 2 0 SH CR/8 (SUTURE) IMPLANT
SUT SILK 2-0 18XBRD TIE 12 (SUTURE) ×1 IMPLANT
SUT SILK 3 0 (SUTURE)
SUT SILK 3 0 SH CR/8 (SUTURE) ×2 IMPLANT
SUT SILK 3-0 18XBRD TIE 12 (SUTURE) IMPLANT
SUT V-LOC BARB 180 2/0GR6 GS22 (SUTURE) ×4
SUT VIC AB 2-0 SH 18 (SUTURE) IMPLANT
SUT VIC AB 2-0 SH 27 (SUTURE)
SUT VIC AB 2-0 SH 27X BRD (SUTURE) IMPLANT
SUT VIC AB 3-0 SH 18 (SUTURE) IMPLANT
SUT VIC AB 4-0 PS2 27 (SUTURE) ×5 IMPLANT
SUT VICRYL 0 UR6 27IN ABS (SUTURE) ×3 IMPLANT
SUTURE V-LC BRB 180 2/0GR6GS22 (SUTURE) IMPLANT
SYR 10ML ECCENTRIC (SYRINGE) ×2 IMPLANT
SYS LAPSCP GELPORT 120MM (MISCELLANEOUS)
SYSTEM LAPSCP GELPORT 120MM (MISCELLANEOUS) IMPLANT
TOWEL OR 17X26 10 PK STRL BLUE (TOWEL DISPOSABLE) IMPLANT
TOWEL OR NON WOVEN STRL DISP B (DISPOSABLE) ×2 IMPLANT
TRAY FOLEY MTR SLVR 16FR STAT (SET/KITS/TRAYS/PACK) ×2 IMPLANT
TROCAR ADV FIXATION 5X100MM (TROCAR) ×2 IMPLANT
TROCAR BLADELESS OPT 5 100 (ENDOMECHANICALS) ×1 IMPLANT
TUBING CONNECTING 10 (TUBING) ×4 IMPLANT
TUBING INSUFFLATION 10FT LAP (TUBING) ×2 IMPLANT

## 2020-05-28 NOTE — Anesthesia Postprocedure Evaluation (Signed)
Anesthesia Post Note  Patient: Briana Ochoa  Procedure(s) Performed: XI ROBOT ASSISTED RESECTION OF SPLENIC FLEXURE (N/A )     Patient location during evaluation: PACU Anesthesia Type: General Level of consciousness: awake and alert Pain management: pain level controlled Vital Signs Assessment: post-procedure vital signs reviewed and stable Respiratory status: spontaneous breathing, nonlabored ventilation, respiratory function stable and patient connected to nasal cannula oxygen Cardiovascular status: blood pressure returned to baseline and stable Postop Assessment: no apparent nausea or vomiting Anesthetic complications: no   No complications documented.  Last Vitals:  Vitals:   05/28/20 1715 05/28/20 1730  BP: 137/69 137/67  Pulse: 79 76  Resp: 10 13  Temp:  36.6 C  SpO2: 93% 95%    Last Pain:  Vitals:   05/28/20 1730  TempSrc:   PainSc: Asleep                 Catalina Gravel

## 2020-05-28 NOTE — Transfer of Care (Signed)
Immediate Anesthesia Transfer of Care Note  Patient: Briana Ochoa  Procedure(s) Performed: XI ROBOT ASSISTED RESECTION OF SPLENIC FLEXURE (N/A )  Patient Location: PACU  Anesthesia Type:General  Level of Consciousness: awake, alert  and oriented  Airway & Oxygen Therapy: Patient Spontanous Breathing and Patient connected to face mask  Post-op Assessment: Report given to RN and Post -op Vital signs reviewed and stable  Post vital signs: Reviewed and stable  Last Vitals:  Vitals Value Taken Time  BP 143/70 05/28/20 1645  Temp 36.6 C 05/28/20 1645  Pulse 79 05/28/20 1651  Resp 8 05/28/20 1651  SpO2 100 % 05/28/20 1651  Vitals shown include unvalidated device data.  Last Pain:  Vitals:   05/28/20 1645  TempSrc:   PainSc: Asleep         Complications: No complications documented.

## 2020-05-28 NOTE — Interval H&P Note (Signed)
History and Physical Interval Note:  05/28/2020 10:38 AM  Briana Ochoa  has presented today for surgery, with the diagnosis of COLON CANCER.  The various methods of treatment have been discussed with the patient and family. After consideration of risks, benefits and other options for treatment, the patient has consented to  Procedure(s): XI ROBOT ASSISTED LAPAROSCOPIC PARTIAL COLECTOMY (N/A) as a surgical intervention.  The patient's history has been reviewed, patient examined, no change in status, stable for surgery.  I have reviewed the patient's chart and labs.  Questions were answered to the patient's satisfaction.     Rosario Adie, MD  [facility]

## 2020-05-28 NOTE — Op Note (Signed)
05/28/2020  4:23 PM  PATIENT:  Briana Ochoa  80 y.o. female  Patient Care Team: Nche, [name], NP as PCP - General (Internal Medicine) Fanny Skates, MD as Consulting Physician (General Surgery) Thea Silversmith, MD as Consulting Physician (Radiation Oncology) Nicholas Lose, MD as Consulting Physician (Hematology and Oncology) Garvin Fila, MD as Consulting Physician (Neurology) Consuella Lose, MD as Consulting Physician (Neurosurgery) Katy Apo, MD as Consulting Physician (Ophthalmology)  PRE-OPERATIVE DIAGNOSIS:  COLON CANCER  POST-OPERATIVE DIAGNOSIS:  COLON CANCER DISTAL TRANSVERSE COLON  PROCEDURE:  XI ROBOT ASSISTED RESECTION OF SPLENIC FLEXURE    Surgeon(s): Leighton Ruff, MD Ileana Roup, MD  ASSISTANT: Dr Dema Severin   ANESTHESIA:   local and general  EBL: 33ml  Total I/O In: 1350 [I.V.:1000; IV Piggyback:350] Out: 150 [Urine:100; Blood:50]  Delay start of Pharmacological VTE agent (>24hrs) due to surgical blood loss or risk of bleeding:  no  DRAINS: none   SPECIMEN:  Source of Specimen:  Splenic flexure  DISPOSITION OF SPECIMEN:  PATHOLOGY  COUNTS:  YES  PLAN OF CARE: Admit to inpatient   PATIENT DISPOSITION:  PACU - hemodynamically stable.  INDICATION:    80 y.o. F with distal transverse colon cancer.  I recommended segmental resection:  The anatomy & physiology of the digestive tract was discussed.  The pathophysiology was discussed.  Natural history risks without surgery was discussed.   I worked to give an overview of the disease and the frequent need to have multispecialty involvement.  I feel the risks of no intervention will lead to serious problems that outweigh the operative risks; therefore, I recommended a partial colectomy to remove the pathology.  Laparoscopic & open techniques were discussed.   Risks such as bleeding, infection, abscess, leak, reoperation, possible ostomy, hernia, heart attack, death, and  other risks were discussed.  I noted a good likelihood this will help address the problem.   Goals of post-operative recovery were discussed as well.    The patient expressed understanding & wished to proceed with surgery.  OR FINDINGS:   Patient had large mass of her distal transverse colon  No obvious metastatic disease on visceral parietal peritoneum or liver.  Significant abdominal wall adhesions from previous surgery.    DESCRIPTION:   Informed consent was confirmed.  The patient underwent general anaesthesia without difficulty.  The patient was positioned appropriately.  VTE prevention in place.  The patient's abdomen was clipped, prepped, & draped in a sterile fashion.  Surgical timeout confirmed our plan.  The patient was positioned in reverse Trendelenburg.  Abdominal entry was gained using a Varies needle in the LUQ.  Entry was clean.  I induced carbon dioxide insufflation.  An 12mm robotic port was placed in the LLQ.  Camera inspection revealed no injury.  Extra ports were carefully placed under direct laparoscopic visualization.  I laparoscopically reflected the greater omentum and the upper abdomen the small bowel in the upper abdomen. The patient was appropriately positioned and the robot was docked to the patient's left side.  Instruments were placed under direct visualization.   I began by mobilizing the omentum off of the abdominal wall using the robotic vessel sealer and blunt dissection to divide the adhesions.  Once this was free I identified the middle transverse colon with a mass distal to this.  The omentum was taken down off of the colon.  I then identified the gastro colic ligament and divided this using the robotic vessel sealer.  I continued all the way  around the splenic flexure and continued dividing the white line of Toldt of the descending colon.  I mobilized down to the left pelvis to allow for colon to be mobilized for anastomosis.  I then transected at the mid  transverse colon using a robotic blue load stapler.  I divided the mesentery down to the level of the right branch of the middle colic artery.  This was taken also using the vessel sealer.  I continue my mesenteric dissection high to allow for lymph node resection.  I then identified a portion of the descending colon just distal to the splenic flexure.  This was divided using a blue load robotic stapler.  The remaining mesentery was divided using the robotic vessel sealer.  I then mobilized the remainder of the omentum off of the right transverse colon to allow for mobilization to the mid abdomen.  Once both ends were mobilized, I injected firefly intravenously to assess for perfusion.  There was good perfusion of both ends of the colon.  I then made a small enterotomy in the transverse colon and sigmoid colon using robotic scissors.  A white load 60 mm robotic stapler x2 was used to create an anastomosis and a isoperistaltic fashion.  The common enterotomy was closed using 2, 2 OV lock running sutures.  I used a 2 OV lock remaining pieces to tack the 2 ends of the colon together to avoid tension to the anastomosis.  Once this was completed, the abdomen was inspected.  Hemostasis was good.  The robot was then undocked.  Laparoscopically we placed 2, 0 Vicryl sutures through the right lower quadrant 12 mm port site to prevent a hernia.  This was done under laparoscopic visualization.  The abdomen was then desufflated.  The ports were removed.  A Pfannenstiel incision was made and an Energy wound protector was placed.  The specimen was removed from the abdomen and sent to pathology for further examination with a stitch marking the proximal colon.  The wound protector was removed and the peritoneum was closed with a running 0 Vicryl suture.  The fascia was closed using interrupted #1 Novafil sutures.  The subcutaneous tissue was reapproximated using a running 2-0 Vicryl suture.  The skin was closed using a 4-0 Vicryl  suture.  A sterile dressing was applied.  The remaining port sites were closed using interrupted 4-0 Vicryl sutures and Dermabond.  The patient was then awakened from anesthesia and sent to the postanesthesia care unit in stable condition.  All counts were correct per operating room staff.  An MD assistant was necessary for tissue manipulation, retraction and positioning due to the complexity of the case and hospital policies

## 2020-05-28 NOTE — Anesthesia Procedure Notes (Signed)
Procedure Name: Intubation Date/Time: 05/28/2020 12:33 PM Performed by: Catalina Gravel, MD Pre-anesthesia Checklist: Patient identified, Emergency Drugs available, Suction available and Patient being monitored Patient Re-evaluated:Patient Re-evaluated prior to induction Oxygen Delivery Method: Circle system utilized Preoxygenation: Pre-oxygenation with 100% oxygen Induction Type: IV induction Ventilation: Mask ventilation without difficulty Laryngoscope Size: Mac and 3 Grade View: Grade I Tube type: Oral Tube size: 7.0 mm Number of attempts: 1 Airway Equipment and Method: Stylet and Oral airway Placement Confirmation: ETT inserted through vocal cords under direct vision,  positive ETCO2 and breath sounds checked- equal and bilateral Secured at: 21 cm Tube secured with: Tape Dental Injury: Teeth and Oropharynx as per pre-operative assessment

## 2020-05-28 NOTE — Progress Notes (Addendum)
Pt c/o nausea and vomiting at 1940. Zofran was previously given at 1821. Marlou Starks, MD notified and promethazine 12.5 mg IVPB ordered.   Pt stated that she was no longer nauseated and refused PRN. Stated "I want to wait to take it."

## 2020-05-28 NOTE — Progress Notes (Signed)
CBG 11 am  05/28/2020 pre surgery 123

## 2020-05-29 LAB — BASIC METABOLIC PANEL
Anion gap: 11 (ref 5–15)
BUN: 13 mg/dL (ref 8–23)
CO2: 17 mmol/L — ABNORMAL LOW (ref 22–32)
Calcium: 8.4 mg/dL — ABNORMAL LOW (ref 8.9–10.3)
Chloride: 110 mmol/L (ref 98–111)
Creatinine, Ser: 1.71 mg/dL — ABNORMAL HIGH (ref 0.44–1.00)
GFR, Estimated: 30 mL/min — ABNORMAL LOW (ref >60)
Glucose, Bld: 196 mg/dL — ABNORMAL HIGH (ref 70–99)
Potassium: 5.6 mmol/L — ABNORMAL HIGH (ref 3.5–5.1)
Sodium: 138 mmol/L (ref 135–145)

## 2020-05-29 LAB — CBC
HCT: 27.8 % — ABNORMAL LOW (ref 36.0–46.0)
Hemoglobin: 7.7 g/dL — ABNORMAL LOW (ref 12.0–15.0)
MCH: 25.2 pg — ABNORMAL LOW (ref 26.0–34.0)
MCHC: 27.7 g/dL — ABNORMAL LOW (ref 30.0–36.0)
MCV: 90.8 fL (ref 80.0–100.0)
Platelets: 520 10*3/uL — ABNORMAL HIGH (ref 150–400)
RBC: 3.06 MIL/uL — ABNORMAL LOW (ref 3.87–5.11)
RDW: 22.2 % — ABNORMAL HIGH (ref 11.5–15.5)
WBC: 14.5 10*3/uL — ABNORMAL HIGH (ref 4.0–10.5)
nRBC: 0 % (ref 0.0–0.2)

## 2020-05-29 LAB — GLUCOSE, CAPILLARY
Glucose-Capillary: 123 mg/dL — ABNORMAL HIGH (ref 70–99)
Glucose-Capillary: 134 mg/dL — ABNORMAL HIGH (ref 70–99)
Glucose-Capillary: 127 mg/dL — ABNORMAL HIGH (ref 70–99)
Glucose-Capillary: 162 mg/dL — ABNORMAL HIGH (ref 70–99)
Glucose-Capillary: 120 mg/dL — ABNORMAL HIGH (ref 70–99)

## 2020-05-29 MED ORDER — CHLORHEXIDINE GLUCONATE CLOTH 2 % EX PADS
6.0000 | MEDICATED_PAD | Freq: Every day | CUTANEOUS | Status: DC
  Administered 2020-05-29 – 2020-05-31 (×3): 6 via TOPICAL

## 2020-05-29 MED ORDER — INSULIN ASPART 100 UNIT/ML ~~LOC~~ SOLN
0.0000 [IU] | Freq: Three times a day (TID) | SUBCUTANEOUS | Status: DC
  Administered 2020-05-29: 3 [IU] via SUBCUTANEOUS
  Administered 2020-05-29: 4 [IU] via SUBCUTANEOUS
  Administered 2020-05-30 (×3): 3 [IU] via SUBCUTANEOUS

## 2020-05-29 MED ORDER — INSULIN ASPART 100 UNIT/ML ~~LOC~~ SOLN: 0.0000 [IU] | Freq: Every day | SUBCUTANEOUS | Status: DC

## 2020-05-29 MED ORDER — DEXTROSE-NACL 5-0.45 % IV SOLN: INTRAVENOUS | Status: DC

## 2020-05-29 MED ORDER — SODIUM CHLORIDE 0.9 % IV SOLN: INTRAVENOUS | Status: DC

## 2020-05-29 NOTE — Evaluation (Signed)
Physical Therapy Evaluation Patient Details Name: Briana Ochoa MRN: [id number] DOB: 07/24/1940 Today's Date: 05/29/2020   History of Present Illness  Patient is 80 y.o. female who presented with colorectal cancer and is now s/p resection of splenic flexure on 05/28/20. PMH significant for bladder and breast cancer, CKD, DM, OA, HTN, HLD, CVA.    Clinical Impression  Briana Ochoa is 80 y.o. female admitted with above HPI POD1 s/p splenic flexure resection. Patient is currently limited by functional impairments below (see PT problem list). Patient lives with her daughter and is independent at baseline. Patient educated on safe log roll technique for bed mobility to reduce abdominal pain and initiated transfers and gait today. Min assist required for gait with HHA and IV pole, pt's gait improved with use of RW and min guard assist provided. Patient will benefit from continued skilled PT interventions to address impairments and progress independence with mobility, recommending HHPT vs no follow up, anticipate pt will continue to progress well as she mobilize with therapy and nursing staff. Acute PT will follow and progress as able.     Follow Up Recommendations No PT follow up;Home health PT (HH vs no follow up (anticipate pt will progress well))    Equipment Recommendations  Rolling walker with 5" wheels    Recommendations for Other Services       Precautions / Restrictions Precautions Precautions: Fall Restrictions Weight Bearing Restrictions: No      Mobility  Bed Mobility Overal bed mobility: Needs Assistance Bed Mobility: Rolling;Sidelying to Sit Rolling: Min assist Sidelying to sit: Min assist;HOB elevated       General bed mobility comments: Min cues and assist to sequence log roll technique for bed mobility. Assist required to fully bring LE's off and to raise trunk upright. bed pad used to scoot anteriorly to EOB.    Transfers Overall transfer level:  Needs assistance Equipment used: Rolling walker (2 wheeled);1 person hand held assist Transfers: Sit to/from Stand Sit to Stand: Min assist;Min guard         General transfer comment: HHA for rise from EOB and min assist to steady. pt balance improved with RW when standing from recliner. bil UE use for power up and cues to breath out when rising.  Ambulation/Gait Ambulation/Gait assistance: Min guard Gait Distance (Feet): 100 Feet Assistive device: Rolling walker (2 wheeled);1 person hand held assist Gait Pattern/deviations: Step-through pattern;Decreased stride length Gait velocity: decr   General Gait Details: HHA for small steps around EOB; pt reaching for bed rail and wall for extra support. Pt's balance much improved with RW for gait in hallway. some dizziness after first bout in room but improved with gait in hallway. SpO2 94% or better on RA and HR 80-100 bpm.  Stairs            Wheelchair Mobility    Modified Rankin (Stroke Patients Only)       Balance Overall balance assessment: Needs assistance Sitting-balance support: Feet supported Sitting balance-Leahy Scale: Good     Standing balance support: During functional activity;Bilateral upper extremity supported Standing balance-Leahy Scale: Fair Standing balance comment: pt unsteady without UE support                             Pertinent Vitals/Pain Pain Assessment: Faces Faces Pain Scale: Hurts little more Pain Location: abdomen Pain Descriptors / Indicators: Discomfort;Guarding;Grimacing Pain Intervention(s): Limited activity within patient's tolerance;Monitored during session;Repositioned    Home  Living Family/patient expects to be discharged to:: Private residence Living Arrangements: Children Available Help at Discharge: Family Type of Home: House Home Access: Stairs to enter Entrance Stairs-Rails: Left Entrance Stairs-Number of Steps: 2 Home Layout: One [place] -  single point;Shower seat      Prior Function Level of Independence: Independent               Hand Dominance   Dominant Hand: Right    Extremity/Trunk Assessment   Upper Extremity Assessment Upper Extremity Assessment: Overall WFL for tasks assessed    Lower Extremity Assessment Lower Extremity Assessment: Overall WFL for tasks assessed    Cervical / Trunk Assessment Cervical / Trunk Assessment: Normal  Communication   Communication: No difficulties  Cognition Arousal/Alertness: Awake/alert Behavior During Therapy: WFL for tasks assessed/performed Overall Cognitive Status: Within Functional Limits for tasks assessed                                        General Comments      Exercises     Assessment/Plan    PT Assessment Patient needs continued PT services  PT Problem List Decreased activity tolerance;Decreased balance;Decreased mobility;Decreased knowledge of use of DME;Decreased knowledge of precautions       PT Treatment Interventions DME instruction;Gait training;Stair training;Functional mobility training;Therapeutic activities;Therapeutic exercise;Balance training;Patient/family education    PT Goals (Current goals can be found in the Care Plan section)  Acute Rehab PT Goals Patient Stated Goal: return home PT Goal Formulation: With patient Time For Goal Achievement: 06/12/20 Potential to Achieve Goals: Good    Frequency Min 3X/week   Barriers to discharge        Co-evaluation               AM-PAC PT "6 Clicks" Mobility  Outcome Measure Help needed turning from your back to your side while in a flat bed without using bedrails?: A Little Help needed moving from lying on your back to sitting on the side of a flat bed without using bedrails?: A Little Help needed moving to and from a bed to a chair (including a wheelchair)?: A Little Help needed standing up from a chair using your arms (e.g., wheelchair or bedside  chair)?: A Little Help needed to walk in hospital room?: A Little Help needed climbing 3-5 steps with a railing? : A Little 6 Click Score: 18    End of Session Equipment Utilized During Treatment: Gait belt Activity Tolerance: Patient tolerated treatment well Patient left: in chair;with call bell/phone within reach;with family/visitor present Nurse Communication: Mobility status PT Visit Diagnosis: Unsteadiness on feet (R26.81);Difficulty in walking, not elsewhere classified (R26.2)    Time: 1245-8099 PT Time Calculation (min) (ACUTE ONLY): 31 min   Charges:   PT Evaluation $PT Eval Low Complexity: 1 Low PT Treatments $Gait Training: 8-22 mins        Verner Mould, DPT Acute Rehabilitation Services Office [phone] Pager [phone]    Jacques Navy 05/29/2020, 2:56 PM

## 2020-05-29 NOTE — Progress Notes (Signed)
1 Day Post-Op Robotic resection of splenic flexure Subjective: Denies nausea, no flatus or BM.  Tolerating clears  Objective: Vital signs in last 24 hours: Temp:  [97.5 F (36.4 C)-98.4 F (36.9 C)] 97.7 F (36.5 C) (04/22 0454) Pulse Rate:  [67-100] 78 (04/22 0607) Resp:  [8-20] 14 (04/22 0454) BP: (95-143)/(50-70) 95/57 (04/22 0607) SpO2:  [93 %-100 %] 98 % (04/22 0607) Weight:  [79.4 kg] 79.4 kg (04/21 1847)   Intake/Output from previous day: 04/21 0701 - 04/22 0700 In: 4128 [I.V.:3678; IV Piggyback:450] Out: 1500 [Urine:1450; Blood:50] Intake/Output this shift: No intake/output data recorded.   General appearance: alert and cooperative GI: normal findings: soft, non-distended  Incision: no significant drainage  Lab Results:  Recent Labs    05/26/20 1438 05/29/20 0429  WBC 13.4* 14.5*  HGB 9.3* 7.7*  HCT 31.1* 27.8*  PLT 665* 520*   BMET Recent Labs    05/26/20 1438 05/29/20 0429  NA 140 138  K 4.5 5.6*  CL 108 110  CO2 22 17*  GLUCOSE 90 196*  BUN 20 13  CREATININE 1.06* 1.71*  CALCIUM 9.4 8.4*   PT/INR No results for input(s): LABPROT, INR in the last 72 hours. ABG No results for input(s): PHART, HCO3 in the last 72 hours.  Invalid input(s): PCO2, PO2  MEDS, Scheduled . alvimopan  12 mg Oral BID  . enoxaparin (LOVENOX) injection  40 mg Subcutaneous Q24H  . feeding supplement  237 mL Oral BID BM  . gabapentin  300 mg Oral BID  . insulin aspart  0-20 Units Subcutaneous TID WC  . insulin aspart  0-5 Units Subcutaneous QHS  . saccharomyces boulardii  250 mg Oral BID    Studies/Results: No results found.  Assessment: s/p Procedure(s): XI ROBOT ASSISTED RESECTION OF SPLENIC FLEXURE Patient Active Problem List   Diagnosis Date Noted  . Colon cancer ([place]) 05/28/2020  . Acute deep vein thrombosis (DVT) of calf muscle vein of right lower extremity ([place]) 04/16/2020  . Iron deficiency anemia due to chronic blood loss 04/09/2020  . Type 2  diabetes mellitus with hyperlipidemia ([place]) 04/09/2020  . Positive D dimer 04/08/2020  . Symptomatic anemia 04/08/2020  . Diabetes mellitus ([place]) 11/19/2018  . Diabetes mellitus type 2, diet-controlled ([place])   . HLD (hyperlipidemia)   . Bladder cancer ([place]) 02/09/2018  . Weakness 09/28/2017  . Hematuria 09/28/2017  . DOE (dyspnea on exertion) 09/28/2017  . Malignant melanoma of lower leg, right ([place]) 12/28/2015  . PVD (peripheral vascular disease) ([place]) 12/01/2014  . Morbid obesity ([place]) 10/28/2014  . Intracranial aneurysm 01/09/2014  . Ductal carcinoma in situ (DCIS) of left breast 08/22/2013  . Right internal carotid artery aneurysm 08/21/2013  . Meningioma, multiple ([place]) 08/21/2013  . CKD (chronic kidney disease) stage 3, GFR 30-59 ml/min ([facility]) 07/17/2013  . History of tobacco abuse 07/17/2013  . HTN (hypertension) 04/16/2013  . OA (osteoarthritis) of knee 04/16/2013    Expected post op course  Plan: Advance diet to fulls Mild dehydration due to bowel prep, Cr and K elevated: will cont IVF's w/o K, hold lisinopril, recheck bmet in AM DM: SSI with meals Acute DVT: SQ hep, will restart anticoagulation once hgb stable, SCD's while in bed.  Ambulate in hall TID PT consult  Wean O2    LOS: 1 day     .Rosario Adie, MD [facility] Surgery, [place]    05/29/2020 7:56 AM

## 2020-05-29 NOTE — Discharge Instructions (Signed)

## 2020-05-30 LAB — BASIC METABOLIC PANEL
Anion gap: 6 (ref 5–15)
BUN: 13 mg/dL (ref 8–23)
CO2: 22 mmol/L (ref 22–32)
Calcium: 8.1 mg/dL — ABNORMAL LOW (ref 8.9–10.3)
Chloride: 106 mmol/L (ref 98–111)
Creatinine, Ser: 1.23 mg/dL — ABNORMAL HIGH (ref 0.44–1.00)
GFR, Estimated: 45 mL/min — ABNORMAL LOW (ref >60)
Glucose, Bld: 140 mg/dL — ABNORMAL HIGH (ref 70–99)
Potassium: 4.6 mmol/L (ref 3.5–5.1)
Sodium: 134 mmol/L — ABNORMAL LOW (ref 135–145)

## 2020-05-30 LAB — CBC
HCT: 26.8 % — ABNORMAL LOW (ref 36.0–46.0)
Hemoglobin: 7.6 g/dL — ABNORMAL LOW (ref 12.0–15.0)
MCH: 25.5 pg — ABNORMAL LOW (ref 26.0–34.0)
MCHC: 28.4 g/dL — ABNORMAL LOW (ref 30.0–36.0)
MCV: 89.9 fL (ref 80.0–100.0)
Platelets: 492 10*3/uL — ABNORMAL HIGH (ref 150–400)
RBC: 2.98 MIL/uL — ABNORMAL LOW (ref 3.87–5.11)
RDW: 22.5 % — ABNORMAL HIGH (ref 11.5–15.5)
WBC: 13.1 10*3/uL — ABNORMAL HIGH (ref 4.0–10.5)
nRBC: 0 % (ref 0.0–0.2)

## 2020-05-30 LAB — GLUCOSE, CAPILLARY
Glucose-Capillary: 130 mg/dL — ABNORMAL HIGH (ref 70–99)
Glucose-Capillary: 142 mg/dL — ABNORMAL HIGH (ref 70–99)
Glucose-Capillary: 123 mg/dL — ABNORMAL HIGH (ref 70–99)
Glucose-Capillary: 133 mg/dL — ABNORMAL HIGH (ref 70–99)

## 2020-05-30 MED ORDER — ENOXAPARIN SODIUM 80 MG/0.8ML ~~LOC~~ SOLN
1.0000 mg/kg | Freq: Two times a day (BID) | SUBCUTANEOUS | Status: DC
  Administered 2020-05-30 – 2020-06-01 (×5): 80 mg via SUBCUTANEOUS
  Filled 2020-05-30 (×5): qty 0.8

## 2020-05-30 NOTE — Progress Notes (Signed)
    2 Days Post-Op  Subjective: Hgb stable, tolerating full liquids. No nausea/vomiting. BM this morning. Creatinine downtrending, good UOP.   Objective: Vital signs in last 24 hours: Temp:  [97.7 F (36.5 C)-99.9 F (37.7 C)] 99.5 F (37.5 C) (04/23 0443) Pulse Rate:  [77-107] 107 (04/23 0443) Resp:  [16-18] 17 (04/23 0443) BP: (100-109)/(48-61) 105/55 (04/23 0443) SpO2:  [95 %-98 %] 95 % (04/23 0443)    Intake/Output from previous day: 04/22 0701 - 04/23 0700 In: 2087.6 [P.O.:480; I.V.:1607.6] Out: 1750 [Urine:1750] Intake/Output this shift: Total I/O In: 340 [P.O.:340] Out: -   PE: General: resting comfortably, NAD Neuro: alert and oriented, no focal deficits Resp: normal work of breathing Abdomen: soft, nondistended, nontender to palpation. Incisions clean and dry with no erythema, induration or drainage Extremities: warm and well-perfused  Lab Results:  Recent Labs    05/29/20 0429 05/30/20 0403  WBC 14.5* 13.1*  HGB 7.7* 7.6*  HCT 27.8* 26.8*  PLT 520* 492*   BMET Recent Labs    05/29/20 0429 05/30/20 0403  NA 138 134*  K 5.6* 4.6  CL 110 106  CO2 17* 22  GLUCOSE 196* 140*  BUN 13 13  CREATININE 1.71* 1.23*  CALCIUM 8.4* 8.1*   PT/INR No results for input(s): LABPROT, INR in the last 72 hours. CMP     Component Value Date/Time   NA 134 (L) 05/30/2020 0403   K 4.6 05/30/2020 0403   CL 106 05/30/2020 0403   CO2 22 05/30/2020 0403   GLUCOSE 140 (H) 05/30/2020 0403   BUN 13 05/30/2020 0403   CREATININE 1.23 (H) 05/30/2020 0403   CALCIUM 8.1 (L) 05/30/2020 0403   PROT 6.6 04/23/2020 0906   ALBUMIN 3.5 04/23/2020 0906   AST 9 04/23/2020 0906   ALT 6 04/23/2020 0906   ALKPHOS 71 04/23/2020 0906   BILITOT 0.3 04/23/2020 0906   GFRNONAA 45 (L) 05/30/2020 0403   GFRAA 42 (L) 04/28/2018 0313   Lipase     Component Value Date/Time   LIPASE 36.0 09/28/2017 1227       Assessment/Plan POD2 s/p robotic resection of splenic flexure -  Advance to soft diet - Begin therapeutic lovenox today, check hgb again tomorrow. Plan to resume home Eliquis at discharge. - Ambulate TID - Dispo: inpatient    LOS: 2 days    Michaelle Birks, MD [facility] Surgery General, Hepatobiliary and Pancreatic Surgery 05/30/20 10:42 AM

## 2020-05-31 LAB — CBC
HCT: 25.6 % — ABNORMAL LOW (ref 36.0–46.0)
Hemoglobin: 7.5 g/dL — ABNORMAL LOW (ref 12.0–15.0)
MCH: 25.6 pg — ABNORMAL LOW (ref 26.0–34.0)
MCHC: 29.3 g/dL — ABNORMAL LOW (ref 30.0–36.0)
MCV: 87.4 fL (ref 80.0–100.0)
Platelets: 501 10*3/uL — ABNORMAL HIGH (ref 150–400)
RBC: 2.93 MIL/uL — ABNORMAL LOW (ref 3.87–5.11)
RDW: 22 % — ABNORMAL HIGH (ref 11.5–15.5)
WBC: 14.8 10*3/uL — ABNORMAL HIGH (ref 4.0–10.5)
nRBC: 0 % (ref 0.0–0.2)

## 2020-05-31 LAB — GLUCOSE, CAPILLARY
Glucose-Capillary: 111 mg/dL — ABNORMAL HIGH (ref 70–99)
Glucose-Capillary: 102 mg/dL — ABNORMAL HIGH (ref 70–99)
Glucose-Capillary: 144 mg/dL — ABNORMAL HIGH (ref 70–99)
Glucose-Capillary: 142 mg/dL — ABNORMAL HIGH (ref 70–99)

## 2020-05-31 LAB — BASIC METABOLIC PANEL
Anion gap: 7 (ref 5–15)
BUN: 16 mg/dL (ref 8–23)
CO2: 24 mmol/L (ref 22–32)
Calcium: 8.5 mg/dL — ABNORMAL LOW (ref 8.9–10.3)
Chloride: 106 mmol/L (ref 98–111)
Creatinine, Ser: 1.23 mg/dL — ABNORMAL HIGH (ref 0.44–1.00)
GFR, Estimated: 45 mL/min — ABNORMAL LOW (ref >60)
Glucose, Bld: 122 mg/dL — ABNORMAL HIGH (ref 70–99)
Potassium: 4.5 mmol/L (ref 3.5–5.1)
Sodium: 137 mmol/L (ref 135–145)

## 2020-05-31 NOTE — Progress Notes (Signed)
3 Days Post-Op   Subjective/Chief Complaint: Comfortable Tolerating po Having BM's   Objective: Vital signs in last 24 hours: Temp:  [98 F (36.7 C)-98.7 F (37.1 C)] 98.7 F (37.1 C) (04/24 0627) Pulse Rate:  [87-93] 88 (04/24 0627) Resp:  [16-18] 18 (04/24 0627) BP: (103-122)/(53-71) 111/71 (04/24 0627) SpO2:  [94 %-95 %] 94 % (04/24 0627)    Intake/Output from previous day: 04/23 0701 - 04/24 0700 In: 980 [P.O.:980] Out: 650 [Urine:650] Intake/Output this shift: No intake/output data recorded.  Exam: Awake and alert Looks comfortable Abdomen soft, minimally tender  Lab Results:  Recent Labs    05/30/20 0403 05/31/20 0437  WBC 13.1* 14.8*  HGB 7.6* 7.5*  HCT 26.8* 25.6*  PLT 492* 501*   BMET Recent Labs    05/30/20 0403 05/31/20 0437  NA 134* 137  K 4.6 4.5  CL 106 106  CO2 22 24  GLUCOSE 140* 122*  BUN 13 16  CREATININE 1.23* 1.23*  CALCIUM 8.1* 8.5*   PT/INR No results for input(s): LABPROT, INR in the last 72 hours. ABG No results for input(s): PHART, HCO3 in the last 72 hours.  Invalid input(s): PCO2, PO2  Studies/Results: No results found.  Anti-infectives: Anti-infectives (From admission, onward)   Start     Dose/Rate Route Frequency Ordered Stop   05/29/20 0100  cefoTEtan (CEFOTAN) 2 g in sodium chloride 0.9 % 100 mL IVPB        2 g 200 mL/hr over 30 Minutes Intravenous Every 12 hours 05/28/20 1815 05/29/20 0120   05/28/20 1045  cefoTEtan (CEFOTAN) 2 g in sodium chloride 0.9 % 100 mL IVPB        2 g 200 mL/hr over 30 Minutes Intravenous On call to O.R. 05/28/20 1041 05/28/20 1238      Assessment/Plan: s/p Procedure(s): XI ROBOT ASSISTED RESECTION OF SPLENIC FLEXURE (N/A)  Ambulate Watch O2 sats Repeat CBC in the morning Over all is doing well  LOS: 3 days    Coralie Keens MD 05/31/2020

## 2020-05-31 NOTE — TOC Progression Note (Signed)
Transition of Care [facility]) - Progression Note    Patient Details  Name: Briana Ochoa MRN: [id number] Date of Birth: 06-Nov-1940  [facility]) CM/SW Contact  Purcell Mouton, RN Phone Number: 05/31/2020, 3:31 PM  Clinical Narrative:     Pt from home with adult children and plan to return home. TOC will follow for [facility] or DME.  Expected Discharge Plan: Home/Self Care Barriers to Discharge: No Barriers Identified  Expected Discharge Plan and Services Expected Discharge Plan: Home/Self Care       Living arrangements for the past 2 months: Single Family Home                                       Social Determinants of Health (SDOH) Interventions    Readmission Risk Interventions No flowsheet data found.

## 2020-05-31 NOTE — Progress Notes (Signed)
Pharmacy Brief Note - Alvimopan (Entereg)  The standing order set for alvimopan (Entereg) now includes an automatic order to discontinue the drug after the patient has had a bowel movement. The change was approved by the [place] and the Medical Executive Committee.   This patient has had bowel movements documented by nursing. Therefore, alvimopan has been discontinued. If there are questions, please contact the pharmacy at [phone].   Thank you-  Peggyann Juba, PharmD, BCPS 05/31/2020 9:45 AM

## 2020-06-01 LAB — CBC
HCT: 26 % — ABNORMAL LOW (ref 36.0–46.0)
Hemoglobin: 7.6 g/dL — ABNORMAL LOW (ref 12.0–15.0)
MCH: 25.4 pg — ABNORMAL LOW (ref 26.0–34.0)
MCHC: 29.2 g/dL — ABNORMAL LOW (ref 30.0–36.0)
MCV: 87 fL (ref 80.0–100.0)
Platelets: 529 10*3/uL — ABNORMAL HIGH (ref 150–400)
RBC: 2.99 MIL/uL — ABNORMAL LOW (ref 3.87–5.11)
RDW: 21.5 % — ABNORMAL HIGH (ref 11.5–15.5)
WBC: 13.5 10*3/uL — ABNORMAL HIGH (ref 4.0–10.5)
nRBC: 0.1 % (ref 0.0–0.2)

## 2020-06-01 LAB — GLUCOSE, CAPILLARY: Glucose-Capillary: 105 mg/dL — ABNORMAL HIGH (ref 70–99)

## 2020-06-01 MED ORDER — TRAMADOL HCL 50 MG PO TABS: 50.0000 mg | ORAL_TABLET | Freq: Four times a day (QID) | ORAL | 0 refills | Status: DC | PRN

## 2020-06-01 NOTE — Progress Notes (Signed)
PT Cancellation Note  Patient Details Name: Briana Ochoa MRN: [id number] DOB: Aug 31, 1940   Cancelled Treatment:    Reason Eval/Treat Not Completed: Other (comment) (Pt is discharging home. Daugther at bedside and answered all questions regarding DME and activity progression.)   Verner Mould, DPT Acute Rehabilitation Services Office [phone] Pager [phone]

## 2020-06-01 NOTE — Discharge Summary (Signed)
Physician Discharge Summary  Patient ID: Briana Ochoa MRN: [id number] DOB/AGE: 05/08/1940 80 y.o.  Admit date: 05/28/2020 Discharge date: 06/01/2020  Admission Diagnoses: Colon cancer  Discharge Diagnoses:  Active Problems:   Colon cancer [facility])   Discharged Condition: good  Hospital Course: Pt admitted after surgery.  Diet was advanced as tolerated.  Pt worked with PT who felt she was appropriate for home care.  By POD 4 she was felt to be in stable condition for discharge.    Consults: None  Significant Diagnostic Studies: labs: cbc, bmet  Treatments: IV hydration, analgesia: Tramadol, anticoagulation: LMW heparin and surgery: robotic assisted partial colectomy  Discharge Exam: Blood pressure 118/64, pulse 83, temperature 98.5 F (36.9 C), temperature source Oral, resp. rate 18, height 5\' 4"  (1.626 m), weight 81.3 kg, SpO2 91 %. General appearance: alert and cooperative GI: normal findings: soft, non-tender Incision/Wound: clean, dry, intact  Disposition: home   Allergies as of 06/01/2020      Reactions   Feraheme [ferumoxytol] Shortness Of Breath, Palpitations   Flushed face; palpitations, SOB      Medication List    TAKE these medications   acetaminophen 500 MG tablet Commonly known as: TYLENOL Take 1,000 mg by mouth daily.   bismuth subsalicylate 539 JQ/73AL suspension Commonly known as: PEPTO BISMOL Take 30 mLs by mouth every 6 (six) hours as needed for diarrhea or loose stools.   Eliquis 5 MG Tabs tablet Generic drug: apixaban Take 10mg  BID x7days, then 5mg  BID continuously, with food What changed:   how much to take  how to take this  when to take this  additional instructions   ferrous gluconate 324 MG tablet Commonly known as: FERGON Take 1 tablet (324 mg total) by mouth 2 (two) times daily with a meal.   glucose blood test strip Commonly known as: True Metrix Blood Glucose Test Use as instructed to test blood sugar once daily  E08.638   lisinopril 20 MG tablet Commonly known as: ZESTRIL TAKE 1 TABLET EVERY DAY What changed:   how much to take  how to take this  when to take this  additional instructions   pravastatin 10 MG tablet Commonly known as: PRAVACHOL Take 1 tablet (10 mg total) by mouth daily.   traMADol 50 MG tablet Commonly known as: ULTRAM Take 1 tablet (50 mg total) by mouth every 6 (six) hours as needed for moderate pain.            Durable Medical Equipment  (From admission, onward)         Start     Ordered   06/01/20 0824  For home use only DME Walker rolling  Once       Comments: As needed for balance to help transfer and ambulate  Question Answer Comment  Walker: With [place]   Patient needs a walker to treat with the following condition Dependent on walker for ambulation      06/01/20 9379          Follow-up Information    Leighton Ruff, MD. Schedule an appointment as soon as possible for a visit in 2 week(s).   Specialties: General Surgery, Colon and Rectal Surgery Contact information: [place] [address] [phone]               Signed: Rosario Adie 6/83/4196, 8:35 AM

## 2020-06-01 NOTE — Progress Notes (Signed)
Pt alert and oriented, tolerating diet. D/C instructions given, pt d/cd to home. 

## 2020-06-01 NOTE — TOC Transition Note (Signed)
Transition of Care [facility]) - CM/SW Discharge Note   Patient Details  Name: Briana Ochoa MRN: [id number] Date of Birth: [birth year]  Transition of Care [facility]) CM/SW Contact:  Lennart Pall, LCSW Phone Number: 06/01/2020, 9:56 AM   Clinical Narrative:    PT medically cleared for dc today.  Orders in for rolling walker.  Referral placed with [place] and walker to be delivered to room. Pt aware/ agreeable.  No further TOC needs.   Final next level of care: Home/Self Care Barriers to Discharge: No Barriers Identified   Patient Goals and CMS Choice Patient states their goals for this hospitalization and ongoing recovery are:: To get better CMS Medicare.gov Compare Post Acute Care list provided to:: Patient Choice offered to / list presented to : [place]  Discharge Placement                       Discharge Plan and Services                DME Arranged: Walker rolling DME Agency: [employer] Date DME Agency Contacted: 06/01/20 Time DME Agency Contacted: [phone] Representative spoke with at DME Agency: [place] ([place]) Interventions     Readmission Risk Interventions Readmission Risk Prevention Plan 06/01/2020  Transportation Screening Complete  PCP or Specialist Appt within 5-7 Days Complete  Home Care Screening Complete  Some recent data might be hidden

## 2020-06-03 ENCOUNTER — Telehealth: Payer: Self-pay | Admitting: Hematology

## 2020-06-03 LAB — SURGICAL PATHOLOGY

## 2020-06-03 NOTE — Telephone Encounter (Signed)
Received a new pt referral from Dr. Marcello Moores for new dx of colon cancer. Briana Ochoa has been scheduled to see Dr. Burr Medico on 5/12 at 3pm. I cld and lft the appt date and time on the pt's vm. Letter mailed.

## 2020-06-04 ENCOUNTER — Telehealth: Payer: Self-pay | Admitting: Hematology

## 2020-06-04 NOTE — Telephone Encounter (Signed)
Briana Ochoa has returned my call and confirmed her appt w/Dr. Burr Medico on 5/12 at 3pm. Pt aware to arrive 20 minutes early.

## 2020-06-09 ENCOUNTER — Telehealth: Payer: Self-pay | Admitting: Hematology

## 2020-06-09 NOTE — Telephone Encounter (Signed)
Received a msg from Dr. Burr Medico to r/s Briana Ochoa's appt to a different day. She has been cld and rescheduled to see Dr. Burr Medico to 5/13 at 1:20pm. Pt aware to arrive 20 minutes early.

## 2020-06-15 NOTE — Progress Notes (Signed)
[facility] [place]   [phone] Fax:([phone] [facility] Note   Patient Care Team: Nche, [name], NP as PCP - General (Internal Medicine) Fanny Skates, MD as Consulting Physician (General Surgery) Thea Silversmith, MD as Consulting Physician (Radiation Oncology) Nicholas Lose, MD as Consulting Physician (Hematology and Oncology) Garvin Fila, MD as Consulting Physician (Neurology) Consuella Lose, MD as Consulting Physician (Neurosurgery) Katy Apo, MD as Consulting Physician (Ophthalmology) Jonnie Finner, RN as Oncology Nurse Navigator Truitt Merle, MD as Consulting Physician (Hematology and Oncology)  Date of Service:  06/19/2020   CHIEF COMPLAINTS/PURPOSE OF CONSULTATION:  Newly Diagnosed colon cancer, H/o left breast DCIS  REFERRING PHYSICIAN:  Dr Marcello Moores  Oncology History Overview Note  Cancer Staging Cancer of left colon [facility]) Staging form: Colon and Rectum, AJCC 8th Edition - Pathologic stage from 05/28/2020: Stage IIA (pT3, pN0, cM0) - Signed by Truitt Merle, MD on 06/19/2020 Stage prefix: Initial diagnosis Total positive nodes: 0 Histologic grading system: 4 grade system Histologic grade (G): G3 Residual tumor (R): R0 - None  Ductal carcinoma in situ (DCIS) of left breast Staging form: Breast, AJCC 7th Edition - Clinical: Stage 0 (Tis (DCIS), N0, cM0) - Unsigned Specimen type: Core Needle Biopsy Histopathologic type: 9932 Laterality: Left Staging comments: Staged at breast conference 7.22.15  - Pathologic: Stage 0 (Tis (DCIS)(2), N0, cM0) - Unsigned Specimen type: Core Needle Biopsy Histopathologic type: 9932 Laterality: Left Tumor size (mm): 3 Multiple tumors: Yes Number of tumors: 2 Method of lymph node assessment: Clinical Method of detection of distant metastases: Clinical Residual tumor (R): R0 - None Estrogen receptor status: Positive Progesterone receptor status: Positive    Ductal carcinoma in  situ (DCIS) of left breast  08/20/2013 Initial Diagnosis   Left breast 12:00: DCIS with calcifications, yet 100%, PR 100%; 09/04/2013 second group of calcifications also biopsied proven to be DCIS ER/PR positive   09/19/2013 Surgery   Left breast lumpectomy DCIS 2 foci margins negative, 0.2 and 0.3 cm ER 100% PR 100%   10/08/2013 - 11/12/2013 Radiation Therapy   Adjuvant radiation therapy   11/12/2013 - 2020 Anti-estrogen oral therapy   Anastrozole 1 mg daily plan is for 5 years   01/09/2014 - 01/10/2014 Hospital Admission   Pipeline embolization RICA aneurysm in the brain   Bladder cancer ([place])  02/09/2018 Initial Diagnosis   Bladder cancer ([place])   Cancer of left colon ([place])  04/23/2020 Procedure   Upper Endoscopy and Colonoscopy by Dr Carlean Purl IMPRESSION Erythematous mucosa in the antrum. Biopsied. - Small hiatal hernia. - Gastroesophageal flap valve classified as Hill Grade IV (no fold, wide open lumen, hiatal hernia present). - The examination was otherwise normal.   IMPRESSION - Decreased sphincter tone found on digital rectal exam. - Malignant partially obstructing tumor in the proximal descending colon. Biopsied. Tattooed. - Three 5 to 20 mm polyps in the sigmoid colon, removed piecemeal using a cold snare. Resected and retrieved. - Severe diverticulosis in the sigmoid colon. - I left several diminutive descending (proximal to tattoo), sigmoid and rectal polyps - The examination was otherwise normal on direct and retroflexion views.     04/23/2020 Initial Diagnosis   Diagnosis 1. Descending Colon Polyp, mass - ADENOCARCINOMA. 2. Sigmoid Colon Polyp, (3) - MULTIPLE FRAGMENTS OF TUBULAR ADENOMA. - MULTIPLE FRAGMENTS OF SESSILE SERRATED POLYP WITHOUT DYSPLASIA. - NO HIGH GRADE DYSPLASIA OR MALIGNANCY. Microscopic Comment 1. Dr. Vic Ripper has reviewed the case. Dr. Carlean Purl was notified on 04/24/2020. 2. Despite being  left sided the fragments have features of a sessile serrated  polyp, as opposed to, hyperplastic polyp.   04/27/2020 Imaging   CT AP IMPRESSION: Bulky annular soft tissue mass involving the distal transverse colon with extension into adjacent pericolonic fat, consistent with primary colon carcinoma.   No evidence of metastatic disease.   Colonic diverticulosis. No radiographic evidence of diverticulitis.   Aortic Atherosclerosis (ICD10-I70.0).     05/05/2020 Imaging   CT Chest IMPRESSION: Bulky annular soft tissue mass involving the distal transverse colon with extension into adjacent pericolonic fat, consistent with primary colon carcinoma.   No evidence of metastatic disease.   Colonic diverticulosis. No radiographic evidence of diverticulitis.   Aortic Atherosclerosis (ICD10-I70.0).     05/28/2020 Initial Diagnosis   Cancer of left colon ([place])   05/28/2020 Cancer Staging   Staging form: Colon and Rectum, AJCC 8th Edition - Pathologic stage from 05/28/2020: Stage IIA (pT3, pN0, cM0) - Signed by Truitt Merle, MD on 06/19/2020 Stage prefix: Initial diagnosis Total positive nodes: 0 Histologic grading system: 4 grade system Histologic grade (G): G3 Residual tumor (R): R0 - None   05/28/2020 Surgery   XI ROBOT ASSISTED RESECTION OF SPLENIC FLEXURE by Dr Marcello Moores    FINAL MICROSCOPIC DIAGNOSIS:   A. COLON, SPLENIC FLEXURE, RESECTION:  - Invasive adenocarcinoma, poorly differentiated, spanning 9.7 cm.  - Tumor invades through muscularis propria into pericolonic tissue.  - Resection margins are negative.  - Tubular adenoma (X1).  - Sessile serrate polyp without dysplasia (x2).  - Diverticulosis.  - Polypectomy scar.  - Twenty-four of twenty-four lymph nodes negative for carcinoma (0/24).  - See oncology table.       HISTORY OF PRESENTING ILLNESS:  Briana Ochoa 80 y.o. female is a here because of newly diagnosed colon cancer. The patient presents to the clinic today accompanied by her daughter.   She has been very  anemic in early 2022. She required IV Iron and blood transfusion, but had reaction to IV iron with chest pressure. She is currently on oral iron BID. After her PCP had concern for blood clot she went to ED. Work up showed evidence of blood loss. She underwent colonoscopy/endoscopy and was seen to have colon cancer. She was then saw by Dr Marcello Moores who did surgery. She did have blood clots in LE, in hospital in Early April. She was started on blood thinner, but due to GI bleeding, she was stopped. Since her colon surgery she has been back on blood thinner.  Before her colon cancer diagnosis change in bowel habit. She was significantly fatigued and anemic, very symptomatic. Since bowel surgery she has remaining fatigue and pain or right abdomen. She has been abel to return to working.   She has PMHx of Left breast DCIS and Bladder cancer. She also had brain aneurysm which required surgery, that lead to mini stroke. She recovered completely. I reviewed her medication list with her. She needed splenectomy after horse accident.   Her mother has kidney cancer and 2 family members with breast cancer. Her brother her throat cancer.   Socially she is widowed with 4 adult children. She lives with her daughter. She quit smoking after many years. She works part time as Network engineer at [employer].     REVIEW OF SYSTEMS:    Constitutional: Denies fevers, chills or abnormal night sweats Eyes: Denies blurriness of vision, double vision or watery eyes Ears, nose, mouth, throat, and face: Denies mucositis or sore throat Respiratory: Denies cough, dyspnea  or wheezes Cardiovascular: Denies palpitation, chest discomfort or lower extremity swelling Gastrointestinal:  Denies nausea, heartburn or change in bowel habits Skin: Denies abnormal skin rashes Lymphatics: Denies new lymphadenopathy or easy bruising Neurological:Denies numbness, tingling or new weaknesses Behavioral/Psych: Mood is stable, no new changes  All other  systems were reviewed with the patient and are negative.   MEDICAL HISTORY:  Past Medical History:  Diagnosis Date  . Allergy    seasonal allergies  . Anemia    on meds  . Aortic atherosclerosis ([place])   . Bladder cancer ([place]) 2020  . Bladder tumor   . Bloody diarrhea 09/29/2017  . Breast cancer ([place]) 2015   Left Breast Cancer  . Cataract    bilateral -sx  . CKD (chronic kidney disease), stage III ([place])    DM related  . Colon cancer ([place])   . Diabetes mellitus type 2, diet-controlled ([facility])    diet controlled  . Dyspnea 09/28/2017  . Dysuria   . Family history of adverse reaction to anesthesia    aunt- N/V   . Fatigue 09/28/2017  . Frequency of urination   . Grade I diastolic dysfunction 77/93/9030   Noted on ECHO  . Gross hematuria 09/29/2017  . Heart murmur    since rheumatic fever as child  . Hematuria   . History of cardiomegaly 02/12/2005   Mild, noted on CXR  . History of CVA (cerebrovascular accident) 08/05/2014   post op cerebral angiogram, cerebral thrombosis w/ cerebral infarction;  11-02-2017  per pt no residuals  . History of malignant melanoma of skin 12/2015   s/p wide local excision right lower leg (per pt localized)  . History of rheumatic fever as a child   . Hyperlipidemia    on meds  . Hypertension    on meds  . Intracranial aneurysm dx 07/ 2015   paraophthalmic RICA aneurysm /   s/p  Pipeline embolization right ICA 01-09-2014  . Jaundice 09/28/2017  . Malignant neoplasm of central portion of left breast in female, estrogen receptor positive [facility]) oncologist-  dr Lindi Adie   dx 07/ 2015--- DCIS, ER/PR positive-- s/p  left breast lumpectomy 09-19-2013,  completed radiation 11-12-2013,  started arimidex 11-12-2013  . Melanoma ([place])    righ tleg   . OA (osteoarthritis)    knees, hands, R shoulder  . Osteopenia 10/16/2012   Diffuse  . Personal history of radiation therapy   . 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 Sigmoid diverticulosis    . Weakness 09/28/2017  . Wears dentures    upper  . Wears glasses   . Wears hearing aid in both ears     SURGICAL HISTORY: Past Surgical History:  Procedure Laterality Date  . BREAST LUMPECTOMY Left 2015  . BREAST LUMPECTOMY WITH NEEDLE LOCALIZATION Left 09/19/2013   Procedure: LEFT BREAST LUMPECTOMY WITH DOUBLE WIRE BRACKETED  NEEDLE LOCALIZATION;  Surgeon: [name], MD;  Location: [facility];  Service: General;  Laterality: Left;  . CATARACT EXTRACTION W/ INTRAOCULAR LENS  IMPLANT, BILATERAL  06/2015  . [place]  . CHOLECYSTECTOMY OPEN  1970s  . CYSTOSCOPY W/ URETERAL STENT PLACEMENT Left 11/07/2017   Procedure: CYSTOSCOPY WITH RETROGRADE PYELOGRAM/URETERAL STENT PLACEMENT;  Surgeon: Cleon Gustin, MD;  [facility];  Service: Urology;  Laterality: Left;  . CYSTOSCOPY W/ URETERAL STENT PLACEMENT Bilateral 04/27/2018   Procedure: CYSTOSCOPY WITH RETROGRADE PYELOGRAM;  Surgeon: Cleon Gustin, MD;  Location: [facility];  Service: Urology;  Laterality: Bilateral;  . FINGER SURGERY     lt thumb  from dog bite  . IR  NEPHROURETERAL CATH PLACE LEFT  04/28/2018  . MELANOMA EXCISION Right 12/28/2015   Procedure: EXCISION MELANOMA RIGHT LOWER LEG;  Surgeon: Fanny Skates, MD;  Location: [facility];  Service: General;  Laterality: Right;  . RADIOLOGY WITH ANESTHESIA N/A 01/09/2014   Procedure: Embolization;  Surgeon: Consuella Lose, MD;  Location: [facility];  Service: Radiology;  Laterality: N/A;  . SPLENECTOMY, TOTAL  2009 approx.   splen injury due to fall off horse  . TONSILLECTOMY  child  . TRANSURETHRAL RESECTION OF BLADDER TUMOR N/A 11/07/2017   Procedure: TRANSURETHRAL RESECTION OF BLADDER TUMOR (TURBT), POSSIBLE STENT PLACEMENT;  Surgeon: Cleon Gustin, MD;  Location[facility];  Service: Urology;  Laterality: N/A;  . TRANSURETHRAL RESECTION OF BLADDER TUMOR N/A 12/14/2017    Procedure: TRANSURETHRAL RESECTION OF BLADDER TUMOR (TURBT);  Surgeon: Cleon Gustin, MD;  Location: [facility];  Service: Urology;  Laterality: N/A;  30 MINS  . TRANSURETHRAL RESECTION OF BLADDER TUMOR N/A 04/27/2018   Procedure: TRANSURETHRAL RESECTION OF BLADDER TUMOR (TURBT);  Surgeon: Cleon Gustin, MD;  Location: [facility];  Service: Urology;  Laterality: N/A;  30 MINS    SOCIAL HISTORY: Social History   Socioeconomic History  . Marital status: Widowed    Spouse name: Not on file  . Number of children: 4  . Years of education: Not on file  . Highest education level: Not on file  Occupational History    Comment: Retired  Tobacco Use  . Smoking status: Former Smoker    Packs/day: 1.50    Years: 40.00    Pack years: 60.00    Types: Cigarettes    Quit date: 03/06/2007    Years since quitting: 13.2  . Smokeless tobacco: Never Used  Vaping Use  . Vaping Use: Never used  Substance and Sexual Activity  . Alcohol use: No  . Drug use: Never  . Sexual activity: Not on file  Other Topics Concern  . Not on file  Social History Narrative   Widowed 2013 originally from [place] moved to [place] decades ago to work for [employer]   5 children   17 grandchildren      Does not have a living will-desires CPR, does not want prolonged life support if futile.   Social Determinants of Health   Financial Resource Strain: Not on file  Food Insecurity: Not on file  Transportation Needs: Not on file  Physical Activity: Not on file  Stress: Not on file  Social Connections: Not on file  Intimate Partner Violence: Not on file    FAMILY HISTORY: Family History  Problem Relation Age of Onset  . Hypertension Mother   . Kidney cancer Mother   . Heart attack Paternal Grandfather   . Cancer Brother        throat cancer   . Breast cancer Maternal Aunt 80  . Breast cancer Paternal Aunt 7  . Colon polyps Neg Hx   . Colon cancer Neg  Hx   . Esophageal cancer Neg Hx   . Rectal cancer Neg Hx   . Stomach cancer Neg Hx     ALLERGIES:  is allergic to feraheme [ferumoxytol].  MEDICATIONS:  Current Outpatient Medications  Medication Sig Dispense Refill  . acetaminophen (TYLENOL) 500 MG tablet Take 1,000 mg by mouth  daily.    . apixaban (ELIQUIS) 5 MG TABS tablet Take 36m BID x7days, then 547mBID continuously, with food (Patient taking differently: Take 5-10 mg by mouth See admin instructions. Take 1050mwice daily x7days, then 5mg59mice daily continuously, with food) 180 tablet 0  . bismuth subsalicylate (PEPTO BISMOL) 262 MG/15ML suspension Take 30 mLs by mouth every 6 (six) hours as needed for diarrhea or loose stools.    . ferrous gluconate (FERGON) 324 MG tablet Take 1 tablet (324 mg total) by mouth 2 (two) times daily with a meal. 180 tablet 0  . glucose blood (TRUE METRIX BLOOD GLUCOSE TEST) test strip Use as instructed to test blood sugar once daily E08.638 100 each 2  . lisinopril (ZESTRIL) 20 MG tablet TAKE 1 TABLET EVERY DAY (Patient taking differently: Take 20 mg by mouth daily.) 90 tablet 3  . pravastatin (PRAVACHOL) 10 MG tablet Take 1 tablet (10 mg total) by mouth daily. 90 tablet 3  . traMADol (ULTRAM) 50 MG tablet Take 1 tablet (50 mg total) by mouth every 6 (six) hours as needed for moderate pain. 25 tablet 0   No current facility-administered medications for this visit.    PHYSICAL EXAMINATION: ECOG PERFORMANCE STATUS: 1 - Symptomatic but completely ambulatory  Vitals:   06/19/20 1327  BP: 136/66  Pulse: 69  Resp: 15  Temp: 97.7 F (36.5 C)  SpO2: 98%   Filed Weights   06/19/20 1327  Weight: 167 lb 6.4 oz (75.9 kg)    GENERAL:alert, no distress and comfortable SKIN: skin color, texture, turgor are normal, no rashes or significant lesions EYES: normal, Conjunctiva are pink and non-injected, sclera clear  NECK: supple, thyroid normal size, non-tender, without nodularity LYMPH:  no palpable  lymphadenopathy in the cervical, axillary  LUNGS: clear to auscultation and percussion with normal breathing effort HEART: regular rate & rhythm and no murmurs and no lower extremity edema ABDOMEN:abdomen soft, non-tender and normal bowel sounds (+) Surgical incisions healed well. (+) Mild abdominal hematoma of lower right abdomen and scar tissue at incisions  Musculoskeletal:no cyanosis of digits and no clubbing  NEURO: alert & oriented x 3 with fluent speech, no focal motor/sensory deficits  LABORATORY DATA:  I have reviewed the data as listed CBC Latest Ref Rng & Units 06/19/2020 06/01/2020 05/31/2020  WBC 4.0 - 10.5 K/uL 10.2 13.5(H) 14.8(H)  Hemoglobin 12.0 - 15.0 g/dL 10.6(L) 7.6(L) 7.5(L)  Hematocrit 36.0 - 46.0 % 35.5(L) 26.0(L) 25.6(L)  Platelets 150 - 400 K/uL 587(H) 529(H) 501(H)    CMP Latest Ref Rng & Units 05/31/2020 05/30/2020 05/29/2020  Glucose 70 - 99 mg/dL 122(H) 140(H) 196(H)  BUN 8 - 23 mg/dL _0 Creatinine 0.44 - 1.00 mg/dL 1.23(H) 1.23(H) 1.71(H)  Sodium 135 - 145 mmol/L 137 134(L) 138  Potassium 3.5 - 5.1 mmol/L 4.5 4.6 5.6(H)  Chloride 98 - 111 mmol/L 106 106 110  CO2 22 - 32 mmol/L 24 22 17(L)  Calcium 8.9 - 10.3 mg/dL 8.5(L) 8.1(L) 8.4(L)  Total Protein 6.0 - 8.3 g/dL - - -  Total Bilirubin 0.2 - 1.2 mg/dL - - -  Alkaline Phos 39 - 117 U/L - - -  AST 0 - 37 U/L - - -  ALT 0 - 35 U/L - - -     RADIOGRAPHIC STUDIES: I have personally reviewed the radiological images as listed and agreed with the findings in the report. DG Abd Acute W/Chest  Result Date: 05/21/2020 CLINICAL DATA:  79 y8r old with  bloody stools that began last night. Personal history of bladder cancer. EXAM: DG ABDOMEN ACUTE WITH 1 VIEW CHEST COMPARISON:  CT abdomen and pelvis 04/27/2020. Chest x-ray 04/08/2020 and earlier. Acute abdomen series 03/10/2013. FINDINGS: Bowel gas pattern unremarkable without evidence of obstruction or significant ileus. No evidence of free air or significant  air-fluid levels on the erect image. Expected stool burden in the colon. High attenuation material in multiple descending colon diverticula, likely residual oral contrast material from the patient's CT on 04/27/2020. Numerous pelvic phleboliths. No visible opaque urinary tract calculi. Cardiac silhouette normal in size, unchanged. Thoracic aorta tortuous and atherosclerotic, unchanged. Lungs clear. Bronchovascular markings normal. Pulmonary vascularity normal. No visible pleural effusions. No pneumothorax. Surgical clips in LEFT breast from prior lumpectomy IMPRESSION: 1. No acute abdominal abnormality. 2. No acute cardiopulmonary disease. Electronically Signed   By: Evangeline Dakin M.D.   On: 05/21/2020 16:37    ASSESSMENT & PLAN:  Jalise Zawistowski is a 80 y.o. Caucasian female with a history of Anemia, H/o Left breast DCIS, H/o bladder cancer, CKD, DM, Heart murmur, H/o stroke from Brain aneurysm, HLD, HTN, H/o melanoma, Osteopenia, wears hearing aids    1. Colon cancer in Splenic Flexure, pT3N0M0, Stage II, MSI-High -I dicussed image findings and pathology with patient and her daughter today.  -After months of worsened anemia, work up in ED showed GI blood loss. 04/23/20 Colonoscopy showed her to have adenocarcinoma of descending colon.  -She underwent surgical resection with Dr Marcello Moores on 05/28/20. Surgical pathology showed 9.7cm mass in splenic flexure with invasive adenocarcinoma, poorly differentiated was completely removed, negative margins, negative LNs, Stage II. She has intermediate risk of recurrence. I discussed in detail with patient and her daughter today.  -I reviewed her risk of recurrence and role of adjuvant chemotherapy in stage II disease.  We discussed that we only offer chemotherapy in selected stage II disease with high risk features. I discussed if she was younger, I would be more intent on recommending adjuvant chemotherapy to reduce her risk of recurrence due to the high  grade tumor.   -Given her intermediate risk of recurrence, I discussed the [place] which is a pilot study to detect circulating colon cancer tumor cells which has predictive value of risk of recurrence. I recommend doing this test. She is interested. If high risk, I will discuss adjuvant treatment options to reduce her risk.  -I also discussed her colon cancer was MSI-high. I recommend Genetic testing for further evaluation to rule out Lynch syndrome.  She also has personal and family history of breast cancer.I will refer her to genetics, she is agreeable.  -I discussed the surveillance plan, which is a physical exam and lab test (including CBC, CMP and CEA) every 3-4 months for the first 2 years, then every 6-12 months, colonoscopy in one year, and surveillance CT scan every 6-12 month for up to 5 year.  -F/u in 3 months or sooner if her GuardantReveal came back positive.  2. Anemia of iron deficiency  -Secondary to #1 with GI blood loss -She required Blood transfusion and IV Iron. She did have reaction at start of infusion.  -She has been on oral iron BID now. I recommend she take a pill after each meal, overall TID (06/19/20).  -I will check her iron level s/p colon surgery. She is agreeable.   3. H/o of Left breast DCIS, H/o Bladder cancer, H/o Melanoma in 2017 of right lower leg -Diagnosed with DCIS in 2015, S/p left  lumpectomy, Adjuvant Radiation and 5 years of Anastrozole. Treated by Dr Lindi Adie.  -Diagnosed with bladder cancer in 1/32020, early stage, S/p surgery  -She will continue surveillance.   4. Genetic Testing  -Given her personably history of cancer and family history of cancer, I recommend genetic testing. She is agreeable.     5. Comorbidites: CKD, DM, Heart murmur, H/o mini stroke from Brain aneurysm, HTN, Osteopenia -Continue medications and to f/u with other physicians for management.    PLAN:  -Lab today to see if she still needs iv iron  -Pt agreed to  Guardant Reveal testing, will test in 2 weeks, 7 weeks and 3 months   -Send genetic testing referral.  -Lab and f/u in 3 months    Orders Placed This Encounter  Procedures  . CBC with Differential (Cancer Center Only)    Standing Status:   Standing    Number of Occurrences:   10    Standing Expiration Date:   06/19/2021  . CMP ([facility] only)    Standing Status:   Standing    Number of Occurrences:   10    Standing Expiration Date:   06/19/2021  . Iron and TIBC    Standing Status:   Standing    Number of Occurrences:   10    Standing Expiration Date:   06/19/2021  . Ferritin    Standing Status:   Standing    Number of Occurrences:   10    Standing Expiration Date:   06/19/2021  . CEA (IN HOUSE-[facility])FOR [facility] ONLY    Standing Status:   Standing    Number of Occurrences:   10    Standing Expiration Date:   06/19/2021  . Guardant 360    GuardantReveal    Standing Status:   Future    Standing Expiration Date:   06/20/2021  . Guardant 360    GuardantReveal    Standing Status:   Future    Standing Expiration Date:   06/20/2021  . Guardant 360    GuardantReveal    Standing Status:   Future    Standing Expiration Date:   06/20/2021  . Ambulatory referral to Genetics    Referral Priority:   Routine    Referral Type:   Consultation    Referral Reason:   Specialty Services Required    Number of Visits Requested:   1    All questions were answered. The patient knows to call the clinic with any problems, questions or concerns. The total time spent in the appointment was 60 minutes.     Truitt Merle, MD 06/19/2020 2:35 PM  I, Joslyn Devon, am acting as scribe for Truitt Merle, MD.   I have reviewed the above documentation for accuracy and completeness, and I agree with the above.

## 2020-06-17 ENCOUNTER — Other Ambulatory Visit: Payer: Self-pay

## 2020-06-17 NOTE — Progress Notes (Signed)
The proposed treatment discussed in conference is for discussion purposes only and is not a binding recommendation.  The patients have not been physically examined, or presented with their treatment options.  Therefore, final treatment plans cannot be decided.   

## 2020-06-18 ENCOUNTER — Ambulatory Visit: Payer: Medicare PPO | Admitting: Hematology

## 2020-06-19 ENCOUNTER — Inpatient Hospital Stay: Payer: Medicare PPO

## 2020-06-19 ENCOUNTER — Encounter: Payer: Self-pay | Admitting: Hematology

## 2020-06-19 ENCOUNTER — Inpatient Hospital Stay: Payer: Medicare PPO | Attending: Hematology | Admitting: Hematology

## 2020-06-19 ENCOUNTER — Other Ambulatory Visit: Payer: Self-pay

## 2020-06-19 VITALS — BP 136/66 | HR 69 | Temp 97.7°F | Resp 15 | Ht 64.0 in | Wt 167.4 lb

## 2020-06-19 DIAGNOSIS — Z86000 Personal history of in-situ neoplasm of breast: Secondary | ICD-10-CM | POA: Diagnosis not present

## 2020-06-19 DIAGNOSIS — Z8551 Personal history of malignant neoplasm of bladder: Secondary | ICD-10-CM | POA: Diagnosis not present

## 2020-06-19 DIAGNOSIS — D500 Iron deficiency anemia secondary to blood loss (chronic): Secondary | ICD-10-CM | POA: Insufficient documentation

## 2020-06-19 DIAGNOSIS — K922 Gastrointestinal hemorrhage, unspecified: Secondary | ICD-10-CM | POA: Insufficient documentation

## 2020-06-19 DIAGNOSIS — C185 Malignant neoplasm of splenic flexure: Secondary | ICD-10-CM | POA: Diagnosis not present

## 2020-06-19 DIAGNOSIS — Z85820 Personal history of malignant melanoma of skin: Secondary | ICD-10-CM | POA: Insufficient documentation

## 2020-06-19 DIAGNOSIS — C186 Malignant neoplasm of descending colon: Secondary | ICD-10-CM

## 2020-06-19 LAB — CBC WITH DIFFERENTIAL (CANCER CENTER ONLY)
Abs Immature Granulocytes: 0.02 10*3/uL (ref 0.00–0.07)
Basophils Absolute: 0.1 10*3/uL (ref 0.0–0.1)
Basophils Relative: 1 %
Eosinophils Absolute: 0.6 10*3/uL — ABNORMAL HIGH (ref 0.0–0.5)
Eosinophils Relative: 6 %
HCT: 35.5 % — ABNORMAL LOW (ref 36.0–46.0)
Hemoglobin: 10.6 g/dL — ABNORMAL LOW (ref 12.0–15.0)
Immature Granulocytes: 0 %
Lymphocytes Relative: 16 %
Lymphs Abs: 1.6 10*3/uL (ref 0.7–4.0)
MCH: 27.5 pg (ref 26.0–34.0)
MCHC: 29.9 g/dL — ABNORMAL LOW (ref 30.0–36.0)
MCV: 92.2 fL (ref 80.0–100.0)
Monocytes Absolute: 1 10*3/uL (ref 0.1–1.0)
Monocytes Relative: 10 %
Neutro Abs: 6.9 10*3/uL (ref 1.7–7.7)
Neutrophils Relative %: 67 %
Platelet Count: 587 10*3/uL — ABNORMAL HIGH (ref 150–400)
RBC: 3.85 MIL/uL — ABNORMAL LOW (ref 3.87–5.11)
RDW: 22.2 % — ABNORMAL HIGH (ref 11.5–15.5)
WBC Count: 10.2 10*3/uL (ref 4.0–10.5)
nRBC: 0 % (ref 0.0–0.2)

## 2020-06-19 LAB — FERRITIN: Ferritin: 56 ng/mL (ref 11–307)

## 2020-06-19 LAB — IRON AND TIBC
Iron: 29 ug/dL — ABNORMAL LOW (ref 41–142)
Saturation Ratios: 8 % — ABNORMAL LOW (ref 21–57)
TIBC: 366 ug/dL (ref 236–444)
UIBC: 337 ug/dL (ref 120–384)

## 2020-06-19 LAB — CMP (CANCER CENTER ONLY)
ALT: <6 U/L (ref 0–44)
AST: 15 U/L (ref 15–41)
Albumin: 3.6 g/dL (ref 3.5–5.0)
Alkaline Phosphatase: 85 U/L (ref 38–126)
Anion gap: 7 (ref 5–15)
BUN: 20 mg/dL (ref 8–23)
CO2: 27 mmol/L (ref 22–32)
Calcium: 10 mg/dL (ref 8.9–10.3)
Chloride: 106 mmol/L (ref 98–111)
Creatinine: 1.36 mg/dL — ABNORMAL HIGH (ref 0.44–1.00)
GFR, Estimated: 39 mL/min — ABNORMAL LOW (ref >60)
Glucose, Bld: 94 mg/dL (ref 70–99)
Potassium: 5 mmol/L (ref 3.5–5.1)
Sodium: 140 mmol/L (ref 135–145)
Total Bilirubin: <0.2 mg/dL — ABNORMAL LOW (ref 0.3–1.2)
Total Protein: 6.8 g/dL (ref 6.5–8.1)

## 2020-06-19 LAB — CEA (IN HOUSE-CHCC): CEA (CHCC-In House): 3.47 ng/mL (ref 0.00–5.00)

## 2020-06-20 ENCOUNTER — Encounter: Payer: Self-pay | Admitting: Hematology

## 2020-06-22 ENCOUNTER — Telehealth: Payer: Self-pay | Admitting: Hematology

## 2020-06-22 NOTE — Telephone Encounter (Signed)
Left message with follow-up appointments per 5/13 los. Gave option to call back to reschedule if needed.

## 2020-06-22 NOTE — Progress Notes (Signed)
Met with patient and her daughter at her initial medical oncology consult with Dr. Truitt Merle.  She is still recovering from her colon surgery on 05/28/2020 however reports feels she is making progress.  Nutritionally she reports getting full early.  I have suggested she eat small frequent meals every 3-4 hours versus trying to eat large meals.  She is drinking adequately.  I have given her my direct contact information.  She will return in about 2 weeks for Guardant Reveal testing.  She is also being referred for Genetics testing. No barriers to care were identified.

## 2020-06-24 ENCOUNTER — Telehealth: Payer: Self-pay

## 2020-06-24 NOTE — Telephone Encounter (Signed)
-----   Message from Truitt Merle, MD sent at 06/23/2020  4:04 PM EDT ----- Please let pt know her lab results. Her anemia and IDA has much improved, continue oral iron, no need iv iron for now. Tumor marker was normal, no other new concerns, thanks   Truitt Merle  06/23/2020

## 2020-06-24 NOTE — Telephone Encounter (Signed)
Pt called no answer message left to return call to be made aware of most recent labs   "anemia and IDA has much improved, continue oral iron, no need iv iron for now. Tumor marker was normal, no other new concerns, thanks "  Awaiting return call

## 2020-07-07 ENCOUNTER — Inpatient Hospital Stay: Payer: Medicare PPO

## 2020-07-07 ENCOUNTER — Other Ambulatory Visit: Payer: Self-pay | Admitting: Genetic Counselor

## 2020-07-07 ENCOUNTER — Other Ambulatory Visit: Payer: Self-pay

## 2020-07-07 ENCOUNTER — Inpatient Hospital Stay (HOSPITAL_BASED_OUTPATIENT_CLINIC_OR_DEPARTMENT_OTHER): Payer: Medicare PPO | Admitting: Genetic Counselor

## 2020-07-07 DIAGNOSIS — Z85820 Personal history of malignant melanoma of skin: Secondary | ICD-10-CM | POA: Diagnosis not present

## 2020-07-07 DIAGNOSIS — Z8551 Personal history of malignant neoplasm of bladder: Secondary | ICD-10-CM | POA: Diagnosis not present

## 2020-07-07 DIAGNOSIS — D0512 Intraductal carcinoma in situ of left breast: Secondary | ICD-10-CM

## 2020-07-07 DIAGNOSIS — C185 Malignant neoplasm of splenic flexure: Secondary | ICD-10-CM | POA: Diagnosis not present

## 2020-07-07 DIAGNOSIS — C679 Malignant neoplasm of bladder, unspecified: Secondary | ICD-10-CM | POA: Diagnosis not present

## 2020-07-07 DIAGNOSIS — Z8049 Family history of malignant neoplasm of other genital organs: Secondary | ICD-10-CM

## 2020-07-07 DIAGNOSIS — Z86000 Personal history of in-situ neoplasm of breast: Secondary | ICD-10-CM | POA: Diagnosis not present

## 2020-07-07 DIAGNOSIS — C186 Malignant neoplasm of descending colon: Secondary | ICD-10-CM

## 2020-07-07 DIAGNOSIS — K922 Gastrointestinal hemorrhage, unspecified: Secondary | ICD-10-CM | POA: Diagnosis not present

## 2020-07-07 DIAGNOSIS — D500 Iron deficiency anemia secondary to blood loss (chronic): Secondary | ICD-10-CM | POA: Diagnosis not present

## 2020-07-07 DIAGNOSIS — C4371 Malignant melanoma of right lower limb, including hip: Secondary | ICD-10-CM | POA: Diagnosis not present

## 2020-07-07 DIAGNOSIS — Z853 Personal history of malignant neoplasm of breast: Secondary | ICD-10-CM | POA: Diagnosis not present

## 2020-07-07 DIAGNOSIS — Z8051 Family history of malignant neoplasm of kidney: Secondary | ICD-10-CM | POA: Diagnosis not present

## 2020-07-07 DIAGNOSIS — Z800 Family history of malignant neoplasm of digestive organs: Secondary | ICD-10-CM | POA: Diagnosis not present

## 2020-07-07 DIAGNOSIS — Z803 Family history of malignant neoplasm of breast: Secondary | ICD-10-CM | POA: Diagnosis not present

## 2020-07-07 LAB — CMP (CANCER CENTER ONLY)
ALT: 8 U/L (ref 0–44)
AST: 15 U/L (ref 15–41)
Albumin: 3.4 g/dL — ABNORMAL LOW (ref 3.5–5.0)
Alkaline Phosphatase: 73 U/L (ref 38–126)
Anion gap: 9 (ref 5–15)
BUN: 21 mg/dL (ref 8–23)
CO2: 24 mmol/L (ref 22–32)
Calcium: 9.5 mg/dL (ref 8.9–10.3)
Chloride: 107 mmol/L (ref 98–111)
Creatinine: 1.21 mg/dL — ABNORMAL HIGH (ref 0.44–1.00)
GFR, Estimated: 45 mL/min — ABNORMAL LOW (ref >60)
Glucose, Bld: 94 mg/dL (ref 70–99)
Potassium: 4.5 mmol/L (ref 3.5–5.1)
Sodium: 140 mmol/L (ref 135–145)
Total Bilirubin: 0.2 mg/dL — ABNORMAL LOW (ref 0.3–1.2)
Total Protein: 6.6 g/dL (ref 6.5–8.1)

## 2020-07-07 LAB — CBC WITH DIFFERENTIAL (CANCER CENTER ONLY)
Abs Immature Granulocytes: 0.04 10*3/uL (ref 0.00–0.07)
Basophils Absolute: 0.1 10*3/uL (ref 0.0–0.1)
Basophils Relative: 1 %
Eosinophils Absolute: 0.4 10*3/uL (ref 0.0–0.5)
Eosinophils Relative: 4 %
HCT: 37.5 % (ref 36.0–46.0)
Hemoglobin: 11.7 g/dL — ABNORMAL LOW (ref 12.0–15.0)
Immature Granulocytes: 0 %
Lymphocytes Relative: 15 %
Lymphs Abs: 1.6 10*3/uL (ref 0.7–4.0)
MCH: 28.3 pg (ref 26.0–34.0)
MCHC: 31.2 g/dL (ref 30.0–36.0)
MCV: 90.6 fL (ref 80.0–100.0)
Monocytes Absolute: 1.1 10*3/uL — ABNORMAL HIGH (ref 0.1–1.0)
Monocytes Relative: 10 %
Neutro Abs: 7.2 10*3/uL (ref 1.7–7.7)
Neutrophils Relative %: 70 %
Platelet Count: 410 10*3/uL — ABNORMAL HIGH (ref 150–400)
RBC: 4.14 MIL/uL (ref 3.87–5.11)
RDW: 18.2 % — ABNORMAL HIGH (ref 11.5–15.5)
WBC Count: 10.3 10*3/uL (ref 4.0–10.5)
nRBC: 0 % (ref 0.0–0.2)

## 2020-07-07 LAB — FERRITIN: Ferritin: 60 ng/mL (ref 11–307)

## 2020-07-07 LAB — IRON AND TIBC
Iron: 46 ug/dL (ref 41–142)
Saturation Ratios: 14 % — ABNORMAL LOW (ref 21–57)
TIBC: 338 ug/dL (ref 236–444)
UIBC: 292 ug/dL (ref 120–384)

## 2020-07-07 LAB — GENETIC SCREENING ORDER

## 2020-07-08 ENCOUNTER — Encounter: Payer: Self-pay | Admitting: Genetic Counselor

## 2020-07-08 DIAGNOSIS — Z803 Family history of malignant neoplasm of breast: Secondary | ICD-10-CM | POA: Insufficient documentation

## 2020-07-08 DIAGNOSIS — Z800 Family history of malignant neoplasm of digestive organs: Secondary | ICD-10-CM | POA: Insufficient documentation

## 2020-07-08 DIAGNOSIS — Z8049 Family history of malignant neoplasm of other genital organs: Secondary | ICD-10-CM | POA: Insufficient documentation

## 2020-07-08 DIAGNOSIS — Z8051 Family history of malignant neoplasm of kidney: Secondary | ICD-10-CM | POA: Insufficient documentation

## 2020-07-08 NOTE — Progress Notes (Signed)
REFERRING PROVIDER: Truitt Merle, MD [place],  [place] [address]  PRIMARY PROVIDER:  Nche, [name], NP  PRIMARY REASON FOR VISIT:  1. Cancer of left colon ([place])   2. Ductal carcinoma in situ (DCIS) of left breast   3. Malignant melanoma of lower leg, right ([place])   4. Malignant neoplasm of urinary bladder, unspecified site ([place])   5. Family history of kidney cancer   6. Family history of breast cancer   7. Family history of uterine cancer   8. Family history of throat cancer      HISTORY OF PRESENT ILLNESS:   Ms. Coor, a 80 y.o. female, was seen for a [place] cancer genetics consultation at the request of Dr. Burr Medico due to a personal and family history of cancer.  Ms. Garramone presents to clinic today to discuss the possibility of a hereditary predisposition to cancer, genetic testing, and to further clarify her future cancer risks, as well as potential cancer risks for family members.   In July of 2015, at the age of 89, Ms. Creegan was diagnosed with ductal carcinoma in situ of the left breast. The tumor was ER and PR positive. Treatment included lumpectomy (completed 09/19/2013), radiation therapy, and antiestrogen therapy.  In October of 2017, at the age of 38, she was diagnosed with a melanoma of the right lower leg.   In October of 2019, at the age of 37, she was diagnosed with bladder cancer that was treated with surgery.  Most recently, in March of 2022 at the age of 15, Ms. Adel was diagnosed with colon cancer. She underwent surgical resection on 05/28/2020. The tumor was MSI-High with loss of MLH1 and PMS2 staining. MLH1 promoter hypermethylation was present.    CANCER HISTORY:  Oncology History Overview Note  Cancer Staging Cancer of left colon [facility]) Staging form: Colon and Rectum, AJCC 8th Edition - Pathologic stage from 05/28/2020: Stage IIA (pT3, pN0, cM0) - Signed by Truitt Merle, MD on 06/19/2020 Stage prefix: Initial  diagnosis Total positive nodes: 0 Histologic grading system: 4 grade system Histologic grade (G): G3 Residual tumor (R): R0 - None  Ductal carcinoma in situ (DCIS) of left breast Staging form: Breast, AJCC 7th Edition - Clinical: Stage 0 (Tis (DCIS), N0, cM0) - Unsigned Specimen type: Core Needle Biopsy Histopathologic type: 9932 Laterality: Left Staging comments: Staged at breast conference 7.22.15  - Pathologic: Stage 0 (Tis (DCIS)(2), N0, cM0) - Unsigned Specimen type: Core Needle Biopsy Histopathologic type: 9932 Laterality: Left Tumor size (mm): 3 Multiple tumors: Yes Number of tumors: 2 Method of lymph node assessment: Clinical Method of detection of distant metastases: Clinical Residual tumor (R): R0 - None Estrogen receptor status: Positive Progesterone receptor status: Positive    Ductal carcinoma in situ (DCIS) of left breast  08/20/2013 Initial Diagnosis   Left breast 12:00: DCIS with calcifications, yet 100%, PR 100%; 09/04/2013 second group of calcifications also biopsied proven to be DCIS ER/PR positive   09/19/2013 Surgery   Left breast lumpectomy DCIS 2 foci margins negative, 0.2 and 0.3 cm ER 100% PR 100%   10/08/2013 - 11/12/2013 Radiation Therapy   Adjuvant radiation therapy   11/12/2013 - 2020 Anti-estrogen oral therapy   Anastrozole 1 mg daily plan is for 5 years   01/09/2014 - 01/10/2014 Hospital Admission   Pipeline embolization 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 Biopsied. - Small hiatal hernia. - Gastroesophageal flap valve classified as Hill Grade IV (no fold, wide open lumen, hiatal hernia present). - The examination was otherwise normal.   IMPRESSION - Decreased sphincter tone found on digital rectal exam. - Malignant partially  obstructing tumor in the proximal descending colon. Biopsied. Tattooed. - Three 5 to 20 mm polyps in the sigmoid colon, removed piecemeal using a cold snare. Resected and retrieved. - Severe diverticulosis in the sigmoid colon. - I left several diminutive descending (proximal to tattoo), sigmoid and rectal polyps - The examination was otherwise normal on direct and retroflexion views.     04/23/2020 Initial Diagnosis   Diagnosis 1. Descending Colon Polyp, mass - ADENOCARCINOMA. 2. Sigmoid Colon Polyp, (3) - MULTIPLE FRAGMENTS OF TUBULAR ADENOMA. - MULTIPLE FRAGMENTS OF SESSILE SERRATED POLYP WITHOUT DYSPLASIA. - NO HIGH GRADE DYSPLASIA OR MALIGNANCY. Microscopic Comment 1. Dr. Vic Ripper has reviewed the case. Dr. Carlean Purl was notified on 04/24/2020. 2. Despite being left sided the fragments have features of a sessile serrated polyp, as opposed to, hyperplastic polyp.   04/27/2020 Imaging   CT AP IMPRESSION: Bulky annular soft tissue mass involving the distal transverse colon with extension into adjacent pericolonic fat, consistent with primary colon carcinoma.   No evidence of metastatic disease.   Colonic diverticulosis. No radiographic evidence of diverticulitis.   Aortic Atherosclerosis (ICD10-I70.0).     05/05/2020 Imaging   CT Chest IMPRESSION: Bulky annular soft tissue mass involving the distal transverse colon with extension into adjacent pericolonic fat, consistent with primary colon carcinoma.   No evidence of metastatic disease.   Colonic diverticulosis. No radiographic evidence of diverticulitis.   Aortic Atherosclerosis (ICD10-I70.0).     05/28/2020 Initial Diagnosis   Cancer of left colon ([place])   05/28/2020 Cancer Staging   Staging form: Colon and Rectum, AJCC 8th Edition - Pathologic stage from 05/28/2020: Stage IIA (pT3, pN0, cM0) - Signed by Truitt Merle, MD on 06/19/2020 Stage prefix: Initial diagnosis Total positive nodes: 0 Histologic grading system: 4  grade system Histologic grade (G): G3 Residual tumor (R): R0 - None   05/28/2020 Surgery   XI ROBOT ASSISTED RESECTION OF SPLENIC FLEXURE by Dr Marcello Moores    FINAL MICROSCOPIC DIAGNOSIS:   A. COLON, SPLENIC FLEXURE, RESECTION:  - Invasive adenocarcinoma, poorly differentiated, spanning 9.7 cm.  - Tumor invades through muscularis propria into pericolonic tissue.  - Resection margins are negative.  - Tubular adenoma (X1).  - Sessile serrate polyp without dysplasia (x2).  - Diverticulosis.  - Polypectomy scar.  - Twenty-four of twenty-four lymph nodes negative for carcinoma (0/24).  - See oncology table.       RISK FACTORS:  Menarche was at age 37.  First live birth at age 75.  OCP use for 0 years.  Ovaries intact: yes.  Hysterectomy: no.  Menopausal status: postmenopausal.  HRT use: 0 years. Colonoscopy: yes; 04/23/2020. Mammogram within the last year: yes. Any excessive radiation exposure in the past: radiation therapy for breast cancer treatment 2015.   Past Medical History:  Diagnosis Date  . Allergy    seasonal allergies  . Anemia    on meds  . Aortic atherosclerosis ([place])   . Bladder cancer ([place]) 2020  . Bladder tumor   . Bloody diarrhea 09/29/2017  . Breast cancer ([place]) 2015   Left Breast Cancer  . Cataract    bilateral -sx  . CKD (chronic  kidney disease), stage III ([place] [place])    DM related  . Colon cancer ([place])   . Diabetes mellitus type 2, diet-controlled ([facility])    diet controlled  . Dyspnea 09/28/2017  . Dysuria   . Family history of adverse reaction to anesthesia    aunt- N/V   . Family history of breast cancer   . Family history of kidney cancer   . Family history of throat cancer   . Family history of uterine cancer   . Fatigue 09/28/2017  . Frequency of urination   . Grade I diastolic dysfunction 74/16/3845   Noted on ECHO  . Gross hematuria 09/29/2017  . Heart murmur    since rheumatic fever as child  . Hematuria   . History of cardiomegaly  02/12/2005   Mild, noted on CXR  . History of CVA (cerebrovascular accident) 08/05/2014   post op cerebral angiogram, cerebral thrombosis w/ cerebral infarction;  11-02-2017  per pt no residuals  . History of malignant melanoma of skin 12/2015   s/p wide local excision right lower leg (per pt localized)  . History of rheumatic fever as a child   . Hyperlipidemia    on meds  . Hypertension    on meds  . Intracranial aneurysm dx 07/ 2015   paraophthalmic RICA aneurysm /   s/p  Pipeline embolization right ICA 01-09-2014  . Jaundice 09/28/2017  . Malignant neoplasm of central portion of left breast in female, estrogen receptor positive [facility]) oncologist-  dr Lindi Adie   dx 07/ 2015--- DCIS, ER/PR positive-- s/p  left breast lumpectomy 09-19-2013,  completed radiation 11-12-2013,  started arimidex 11-12-2013  . Melanoma ([place])    righ tleg   . OA (osteoarthritis)    knees, hands, R shoulder  . Osteopenia 10/16/2012   Diffuse  . Personal history of radiation therapy   . 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 Sigmoid diverticulosis   . Weakness 09/28/2017  . Wears dentures    upper  . Wears glasses   . Wears hearing aid in both ears     Past Surgical History:  Procedure Laterality Date  . BREAST LUMPECTOMY Left 2015  . BREAST LUMPECTOMY WITH NEEDLE LOCALIZATION Left 09/19/2013   Procedure: LEFT BREAST LUMPECTOMY WITH DOUBLE WIRE BRACKETED  NEEDLE LOCALIZATION;  Surgeon: [name], MD;  Location: [facility];  Service: General;  Laterality: Left;  . CATARACT EXTRACTION W/ INTRAOCULAR LENS  IMPLANT, BILATERAL  06/2015  . [place]  . CHOLECYSTECTOMY OPEN  1970s  . CYSTOSCOPY W/ URETERAL STENT PLACEMENT Left 11/07/2017   Procedure: CYSTOSCOPY WITH RETROGRADE PYELOGRAM/URETERAL STENT PLACEMENT;  Surgeon: Cleon Gustin, MD;  Location: [facility];  Service: Urology;  Laterality: Left;  . CYSTOSCOPY W/ URETERAL STENT PLACEMENT Bilateral 04/27/2018    Procedure: CYSTOSCOPY WITH RETROGRADE PYELOGRAM;  Surgeon: Cleon Gustin, MD;  Location[facility];  Service: Urology;  Laterality: Bilateral;  . FINGER SURGERY     lt thumb  from dog bite  . IR  NEPHROURETERAL CATH PLACE LEFT  04/28/2018  . MELANOMA EXCISION Right 12/28/2015   Procedure: EXCISION MELANOMA RIGHT LOWER LEG;  Surgeon: Fanny Skates, MD;  Location: [facility];  Service: General;  Laterality: Right;  . RADIOLOGY WITH ANESTHESIA N/A 01/09/2014   Procedure: Embolization;  Surgeon: Consuella Lose, MD;  Location: [facility];  Service: Radiology;  Laterality: N/A;  . SPLENECTOMY, TOTAL  2009 approx.   splen  injury due to fall off horse  . TONSILLECTOMY  child  . TRANSURETHRAL RESECTION OF BLADDER TUMOR N/A 11/07/2017   Procedure: TRANSURETHRAL RESECTION OF BLADDER TUMOR (TURBT), POSSIBLE STENT PLACEMENT;  Surgeon: Cleon Gustin, MD;  Location: [facility];  Service: Urology;  Laterality: N/A;  . TRANSURETHRAL RESECTION OF BLADDER TUMOR N/A 12/14/2017   Procedure: TRANSURETHRAL RESECTION OF BLADDER TUMOR (TURBT);  Surgeon: Cleon Gustin, MD;  Location: [facility];  Service: Urology;  Laterality: N/A;  30 MINS  . TRANSURETHRAL RESECTION OF BLADDER TUMOR N/A 04/27/2018   Procedure: TRANSURETHRAL RESECTION OF BLADDER TUMOR (TURBT);  Surgeon: Cleon Gustin, MD;  Location[facility];  Service: Urology;  Laterality: N/A;  30 MINS    Social History   Socioeconomic History  . Marital status: Widowed    Spouse name: Not on file  . Number of children: 4  . Years of education: Not on file  . Highest education level: Not on file  Occupational History    Comment: Retired  Tobacco Use  . Smoking status: Former Smoker    Packs/day: 1.50    Years: 40.00    Pack years: 60.00    Types: Cigarettes    Quit date: 03/06/2007    Years since quitting: 13.3  . Smokeless tobacco: Never Used  Vaping  Use  . Vaping Use: Never used  Substance and Sexual Activity  . Alcohol use: No  . Drug use: Never  . Sexual activity: Not on file  Other Topics Concern  . Not on file  Social History Narrative   Widowed 2013 originally from [place] moved to [place] decades ago to work for [employer]   5 children   17 grandchildren      Does not have a living will-desires CPR, does not want prolonged life support if futile.   Social Determinants of Health   Financial Resource Strain: Not on file  Food Insecurity: Not on file  Transportation Needs: Not on file  Physical Activity: Not on file  Stress: Not on file  Social Connections: Not on file     FAMILY HISTORY:  We obtained a detailed, 4-generation family history.  Significant diagnoses are listed below: Family History  Problem Relation Age of Onset  . Hypertension Mother   . Kidney cancer Mother        dx early 26s  . Heart attack Paternal Grandfather   . Throat cancer Brother 58       hx smoking  . Head & neck cancer Son 51       jaw cancer  . Breast cancer Maternal Aunt 80  . Breast cancer Paternal Aunt 53  . Uterine cancer Cousin        dx early 86s (maternal first cousin)  . Colon polyps Neg Hx   . Colon cancer Neg Hx   . Esophageal cancer Neg Hx   . Rectal cancer Neg Hx   . Stomach cancer Neg Hx    Ms. Talcott has one daughter (age 58) and three sons (ages 53-60). One son was diagnosed with jaw cancer around the age of 52. She has one brother (age 70), who was diagnosed with throat cancer at age 45 and had a history of smoking.  Ms. Mantione mother died at age 93 and had a history of unilateral kidney cancer diagnosed in her early 64s. There was one maternal aunt, who had breast cancer diagnosed at age 37. One maternal first cousin  died at age 68 from uterine cancer. Ms. Belfield maternal grandmother died at age 76 without cancer. Her maternal grandfather died in his 66s without cancer.   Ms.  Eckles father died at age 67 without cancer. There were three paternal aunts. One aunt was diagnosed with breast cancer at age 50. There is no known cancer among paternal cousins. Ms. Margolis paternal grandmother died in her 20s without cancer. Her paternal grandfather died in his 6s without cancer.   Ms. Franzoni is unaware of previous family history of genetic testing for hereditary cancer risks. Patient's maternal ancestors are of [place] descent, and paternal ancestors are of [place] descent. There is no reported Ashkenazi Jewish ancestry. There is no known consanguinity.  GENETIC COUNSELING ASSESSMENT: Ms. Reno is a 80 y.o. female with a personal history of breast cancer, bladder cancer, melanoma, and colon cancer, as well as a family history of cancer which is somewhat suggestive of a hereditary cancer syndrome and predisposition to cancer. We, therefore, discussed and recommended the following at today's visit.   DISCUSSION: We discussed that approximately 5-10% of cancer in general is hereditary. Most cases of hereditary breast cancer are associated with the BRCA1 and BRCA2 genes, although there are other genes that can be associated with hereditary breast cancer syndromes (e.g. ATM, CHEK2, PALB2, etc).    On the other hand, most cases of hereditary colon and bladder cancer are associated with Lynch syndrome. We reviewed that Ms. Finchum had abnormal mismatch repair protein IHC, which showed loss of staining of the MLH1 and PMS2 proteins. Additional testing showed that her tumor is MSI-High. We discussed that these were screening tests done on her tumor that can help identify people who may have Lynch syndrome. MLH1 hypermethylation was present in her tumor, explaining why she had abnormal IHC staining and microsatellite instability, and indicating that her colon cancer was likely sporadic and not due to Lynch syndrome.   We discussed that testing is beneficial for several  reasons, including knowing about other cancer risks, identifying potential screening and risk-reduction options that may be appropriate, and to understand if other family members could be at risk for cancer and allow them to undergo genetic testing.   We reviewed the characteristics, features and inheritance patterns of hereditary cancer syndromes. We also discussed genetic testing, including the appropriate family members to test, the process of testing, insurance coverage and turn-around-time for results. We discussed the implications of a negative, positive and/or variant of uncertain significant result. We recommended Ms. Westerlund pursue genetic testing for the [facility] CancerNext-Expanded + RNAinsight panel.   The CancerNext-Expanded + RNAinsight gene panel offered by [facility] [employer] and includes sequencing and rearrangement analysis for the following 77 genes: AIP, ALK, APC, ATM, AXIN2, BAP1, BARD1, BLM, BMPR1A, BRCA1, BRCA2, BRIP1, CDC73, CDH1, CDK4, CDKN1B, CDKN2A, CHEK2, CTNNA1, DICER1, FANCC, FH, FLCN, GALNT12, KIF1B, LZTR1, MAX, MEN1, MET, MLH1, MSH2, MSH3, MSH6, MUTYH, NBN, NF1, NF2, NTHL1, PALB2, PHOX2B, PMS2, POT1, PRKAR1A, PTCH1, PTEN, RAD51C, RAD51D, RB1, RECQL, RET, SDHA, SDHAF2, SDHB, SDHC, SDHD, SMAD4, SMARCA4, SMARCB1, SMARCE1, STK11, SUFU, TMEM127, TP53, TSC1, TSC2, VHL and XRCC2 (sequencing and deletion/duplication); EGFR, EGLN1, HOXB13, KIT, MITF, PDGFRA, POLD1 and POLE (sequencing only); EPCAM and GREM1 (deletion/duplication only). RNA data is routinely analyzed for use in variant interpretation for all genes.  Based on Ms. Carrasco's personal and family history of cancer, she meets medical criteria for genetic testing. Despite that she meets criteria, there may still be an out of pocket cost. We discussed that if her  out of pocket cost for testing is over $100, the laboratory will reach out to let her know. If the out of pocket cost of testing is less than $100 she will be billed by  the genetic testing laboratory.   PLAN: After considering the risks, benefits, and limitations, Ms. Teehan provided informed consent to pursue genetic testing and the blood sample was sent to [facility] for analysis of the CancerNext-Expanded + RNAinsight panel. Results should be available within approximately two-three weeks' time, at which point they will be disclosed by telephone to Ms. Styron, as will any additional recommendations warranted by these results. Ms. Lobo will receive a summary of her genetic counseling visit and a copy of her results once available. This information will also be available in Epic.   Ms. Orne questions were answered to her satisfaction today. Our contact information was provided should additional questions or concerns arise. Thank you for the referral and allowing [place] to share in the care of your patient.   Clint Guy, [place], [facility] Licensed, Certified Dispensing [email or web address] Phone: [phone]  The patient was seen for a total of 40 minutes in face-to-face genetic counseling. This patient was discussed with Drs. Magrinat, Lindi Adie and/or Burr Medico who agrees with the above.    _______________________________________________________________________ For Office Staff:  Number of people involved in session: 1 Was an Intern/ student involved with case: no

## 2020-07-10 ENCOUNTER — Other Ambulatory Visit (HOSPITAL_COMMUNITY): Payer: Medicare PPO

## 2020-07-13 ENCOUNTER — Other Ambulatory Visit (HOSPITAL_COMMUNITY): Payer: Medicare PPO

## 2020-07-28 DIAGNOSIS — C186 Malignant neoplasm of descending colon: Secondary | ICD-10-CM | POA: Diagnosis not present

## 2020-07-30 ENCOUNTER — Encounter: Payer: Self-pay | Admitting: Hematology

## 2020-07-30 ENCOUNTER — Telehealth: Payer: Self-pay | Admitting: Hematology

## 2020-07-30 DIAGNOSIS — Z1379 Encounter for other screening for genetic and chromosomal anomalies: Secondary | ICD-10-CM | POA: Insufficient documentation

## 2020-07-30 NOTE — Telephone Encounter (Signed)
Scheduled appointment per 06/23 sch msg. Left message.

## 2020-07-31 ENCOUNTER — Telehealth: Payer: Self-pay | Admitting: Genetic Counselor

## 2020-07-31 LAB — GUARDANT 360

## 2020-07-31 NOTE — Telephone Encounter (Signed)
LVM that her genetic test results are available and requested that she call back to discuss them.  

## 2020-08-04 ENCOUNTER — Ambulatory Visit: Payer: Self-pay | Admitting: Genetic Counselor

## 2020-08-04 ENCOUNTER — Encounter: Payer: Self-pay | Admitting: Genetic Counselor

## 2020-08-04 DIAGNOSIS — Z1379 Encounter for other screening for genetic and chromosomal anomalies: Secondary | ICD-10-CM

## 2020-08-04 NOTE — Progress Notes (Signed)
HPI:  Briana Ochoa was previously seen in the [place] clinic due to a personal and family history of cancer and concerns regarding a hereditary predisposition to cancer. Please refer to our prior cancer genetics clinic note for more information regarding our discussion, assessment and recommendations, at the time. Briana Ochoa recent genetic test results were disclosed to her, as were recommendations warranted by these results. These results and recommendations are discussed in more detail below.  CANCER HISTORY:  Oncology History Overview Note  Cancer Staging Cancer of left colon [facility]) Staging form: Colon and Rectum, AJCC 8th Edition - Pathologic stage from 05/28/2020: Stage IIA (pT3, pN0, cM0) - Signed by Truitt Merle, MD on 06/19/2020 Stage prefix: Initial diagnosis Total positive nodes: 0 Histologic grading system: 4 grade system Histologic grade (G): G3 Residual tumor (R): R0 - None  Ductal carcinoma in situ (DCIS) of left breast Staging form: Breast, AJCC 7th Edition - Clinical: Stage 0 (Tis (DCIS), N0, cM0) - Unsigned Specimen type: Core Needle Biopsy Histopathologic type: 9932 Laterality: Left Staging comments: Staged at breast conference 7.22.15  - Pathologic: Stage 0 (Tis (DCIS)(2), N0, cM0) - Unsigned Specimen type: Core Needle Biopsy Histopathologic type: 9932 Laterality: Left Tumor size (mm): 3 Multiple tumors: Yes Number of tumors: 2 Method of lymph node assessment: Clinical Method of detection of distant metastases: Clinical Residual tumor (R): R0 - None Estrogen receptor status: Positive Progesterone receptor status: Positive    Ductal carcinoma in situ (DCIS) of left breast  08/20/2013 Initial Diagnosis   Left breast 12:00: DCIS with calcifications, yet 100%, PR 100%; 09/04/2013 second group of calcifications also biopsied proven to be DCIS ER/PR positive    09/19/2013 Surgery   Left breast lumpectomy DCIS 2 foci margins negative, 0.2  and 0.3 cm ER 100% PR 100%    10/08/2013 - 11/12/2013 Radiation Therapy   Adjuvant radiation therapy    11/12/2013 - 2020 Anti-estrogen oral therapy   Anastrozole 1 mg daily plan is for 5 years    01/09/2014 - 01/10/2014 Hospital Admission   Pipeline embolization 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 The report date is 07/30/2020.   The CancerNext-Expanded + RNAinsight gene panel offered by [employer] and includes sequencing and rearrangement analysis for the following 77 genes: AIP, ALK, APC, ATM, AXIN2, BAP1, BARD1, BLM, BMPR1A, BRCA1, BRCA2, BRIP1, CDC73, CDH1, CDK4, CDKN1B, CDKN2A, CHEK2, CTNNA1, DICER1, FANCC, FH, FLCN, GALNT12, KIF1B, LZTR1, MAX, MEN1, MET, MLH1, MSH2, MSH3, MSH6, MUTYH, NBN, NF1, NF2, NTHL1, PALB2, PHOX2B, PMS2, POT1, PRKAR1A, PTCH1, PTEN, RAD51C, RAD51D, RB1, RECQL, RET, SDHA, SDHAF2, SDHB, SDHC, SDHD, SMAD4, SMARCA4, SMARCB1, SMARCE1, STK11, SUFU, TMEM127, TP53, TSC1, TSC2, VHL and XRCC2 (sequencing and deletion/duplication); EGFR, EGLN1, HOXB13, KIT, MITF, PDGFRA, POLD1 and POLE (sequencing only); EPCAM and GREM1 (deletion/duplication only). RNA data is routinely analyzed for use in variant interpretation for all genes.   Bladder cancer ([place])  02/09/2018 Initial Diagnosis   Bladder cancer ([place])    07/30/2020 Genetic Testing   Negative genetic testing:  No pathogenic variants detected on the [facility] CancerNext-Expanded + RNAinsight panel. The report date is 07/30/2020.   The CancerNext-Expanded + RNAinsight gene panel offered by [employer] and includes sequencing and rearrangement analysis for the following 77 genes: AIP, ALK, APC, ATM, AXIN2, BAP1, BARD1, BLM, BMPR1A, BRCA1, BRCA2, BRIP1, CDC73, CDH1, CDK4, CDKN1B, CDKN2A, CHEK2, CTNNA1, DICER1, FANCC, FH, FLCN, GALNT12,  KIF1B, LZTR1, MAX, MEN1, MET, MLH1, MSH2, MSH3, MSH6, MUTYH, NBN,  NF1, NF2, NTHL1, PALB2, PHOX2B, PMS2, POT1, PRKAR1A, PTCH1, PTEN, RAD51C, RAD51D, RB1, RECQL, RET, SDHA, SDHAF2, SDHB, SDHC, SDHD, SMAD4, SMARCA4, SMARCB1, SMARCE1, STK11, SUFU, TMEM127, TP53, TSC1, TSC2, VHL and XRCC2 (sequencing and deletion/duplication); EGFR, EGLN1, HOXB13, KIT, MITF, PDGFRA, POLD1 and POLE (sequencing only); EPCAM and GREM1 (deletion/duplication only). RNA data is routinely analyzed for use in variant interpretation for all genes.   Cancer of left colon ([place])  04/23/2020 Procedure   Upper Endoscopy and Colonoscopy by Dr Carlean Purl IMPRESSION Erythematous mucosa in the antrum. Biopsied. - Small hiatal hernia. - Gastroesophageal flap valve classified as Hill Grade IV (no fold, wide open lumen, hiatal hernia present). - The examination was otherwise normal.   IMPRESSION - Decreased sphincter tone found on digital rectal exam. - Malignant partially obstructing tumor in the proximal descending colon. Biopsied. Tattooed. - Three 5 to 20 mm polyps in the sigmoid colon, removed piecemeal using a cold snare. Resected and retrieved. - Severe diverticulosis in the sigmoid colon. - I left several diminutive descending (proximal to tattoo), sigmoid and rectal polyps - The examination was otherwise normal on direct and retroflexion views.     04/23/2020 Initial Diagnosis   Diagnosis 1. Descending Colon Polyp, mass - ADENOCARCINOMA. 2. Sigmoid Colon Polyp, (3) - MULTIPLE FRAGMENTS OF TUBULAR ADENOMA. - MULTIPLE FRAGMENTS OF SESSILE SERRATED POLYP WITHOUT DYSPLASIA. - NO HIGH GRADE DYSPLASIA OR MALIGNANCY. Microscopic Comment 1. Dr. Vic Ripper has reviewed the case. Dr. Carlean Purl was notified on 04/24/2020. 2. Despite being left sided the fragments have features of a sessile serrated polyp, as opposed to, hyperplastic polyp.   04/27/2020 Imaging   CT AP IMPRESSION: Bulky annular soft tissue mass involving the distal transverse colon with extension into adjacent pericolonic fat,  consistent with primary colon carcinoma.   No evidence of metastatic disease.   Colonic diverticulosis. No radiographic evidence of diverticulitis.   Aortic Atherosclerosis (ICD10-I70.0).     05/05/2020 Imaging   CT Chest IMPRESSION: Bulky annular soft tissue mass involving the distal transverse colon with extension into adjacent pericolonic fat, consistent with primary colon carcinoma.   No evidence of metastatic disease.   Colonic diverticulosis. No radiographic evidence of diverticulitis.   Aortic Atherosclerosis (ICD10-I70.0).     05/28/2020 Initial Diagnosis   Cancer of left colon ([place])    05/28/2020 Cancer Staging   Staging form: Colon and Rectum, AJCC 8th Edition - Pathologic stage from 05/28/2020: Stage IIA (pT3, pN0, cM0) - Signed by Truitt Merle, MD on 06/19/2020  Stage prefix: Initial diagnosis  Total positive nodes: 0  Histologic grading system: 4 grade system  Histologic grade (G): G3  Residual tumor (R): R0 - None    05/28/2020 Surgery   XI ROBOT ASSISTED RESECTION OF SPLENIC FLEXURE by Dr Marcello Moores    FINAL MICROSCOPIC DIAGNOSIS:   A. COLON, SPLENIC FLEXURE, RESECTION:  - Invasive adenocarcinoma, poorly differentiated, spanning 9.7 cm.  - Tumor invades through muscularis propria into pericolonic tissue.  - Resection margins are negative.  - Tubular adenoma (X1).  - Sessile serrate polyp without dysplasia (x2).  - Diverticulosis.  - Polypectomy scar.  - Twenty-four of twenty-four lymph nodes negative for carcinoma (0/24).  - See oncology table.    07/30/2020 Genetic Testing   Negative genetic testing:  No pathogenic variants detected on the [employer] CancerNext-Expanded + RNAinsight panel. The report date is 07/30/2020.   The CancerNext-Expanded + RNAinsight gene panel offered by [employer] and includes sequencing and  rearrangement analysis for the following 77 genes: AIP, ALK, APC, ATM, AXIN2, BAP1, BARD1, BLM, BMPR1A, BRCA1, BRCA2, BRIP1, CDC73, CDH1,  CDK4, CDKN1B, CDKN2A, CHEK2, CTNNA1, DICER1, FANCC, FH, FLCN, GALNT12, KIF1B, LZTR1, MAX, MEN1, MET, MLH1, MSH2, MSH3, MSH6, MUTYH, NBN, NF1, NF2, NTHL1, PALB2, PHOX2B, PMS2, POT1, PRKAR1A, PTCH1, PTEN, RAD51C, RAD51D, RB1, RECQL, RET, SDHA, SDHAF2, SDHB, SDHC, SDHD, SMAD4, SMARCA4, SMARCB1, SMARCE1, STK11, SUFU, TMEM127, TP53, TSC1, TSC2, VHL and XRCC2 (sequencing and deletion/duplication); EGFR, EGLN1, HOXB13, KIT, MITF, PDGFRA, POLD1 and POLE (sequencing only); EPCAM and GREM1 (deletion/duplication only). RNA data is routinely analyzed for use in variant interpretation for all genes.     FAMILY HISTORY:  We obtained a detailed, 4-generation family history.  Significant diagnoses are listed below: Family History  Problem Relation Age of Onset   Hypertension Mother    Kidney cancer Mother        dx early 60s   Heart attack Paternal Grandfather    Throat cancer Brother 58       hx smoking   Head & neck cancer Son 76       jaw cancer   Breast cancer Maternal Aunt 80   Breast cancer Paternal Aunt 2   Uterine cancer Cousin        dx early 75s (maternal first cousin)   Colon polyps Neg Hx    Colon cancer Neg Hx    Esophageal cancer Neg Hx    Rectal cancer Neg Hx    Stomach cancer Neg Hx    Briana Ochoa has one daughter (age 5) and three sons (ages 28-60). One son was diagnosed with jaw cancer around the age of 52. She has one brother (age 34), who was diagnosed with throat cancer at age 86 and had a history of smoking.   Briana Ochoa mother died at age 52 and had a history of unilateral kidney cancer diagnosed in her early 3s. There was one maternal aunt, who had breast cancer diagnosed at age 49. One maternal first cousin died at age 76 from uterine cancer. Briana Ochoa maternal grandmother died at age 41 without cancer. Her maternal grandfather died in his 8s without cancer.    Briana Ochoa father died at age 66 without cancer. There were three paternal aunts. One  aunt was diagnosed with breast cancer at age 47. There is no known cancer among paternal cousins. Briana Ochoa paternal grandmother died in her 25s without cancer. Her paternal grandfather died in his 73s without cancer.   Briana Ochoa is unaware of previous family history of genetic testing for hereditary cancer risks. Patient's maternal ancestors are of [place] descent, and paternal ancestors are of [place] descent. There is no reported Ashkenazi Jewish ancestry. There is no known consanguinity.  GENETIC TEST RESULTS: Genetic testing reported out on 07/30/2020 through the [facility] CancerNext-Expanded + RNAinsight panel. No pathogenic variants were detected.   The CancerNext-Expanded + RNAinsight gene panel offered by [employer] and includes sequencing and rearrangement analysis for the following 77 genes: AIP, ALK, APC, ATM, AXIN2, BAP1, BARD1, BLM, BMPR1A, BRCA1, BRCA2, BRIP1, CDC73, CDH1, CDK4, CDKN1B, CDKN2A, CHEK2, CTNNA1, DICER1, FANCC, FH, FLCN, GALNT12, KIF1B, LZTR1, MAX, MEN1, MET, MLH1, MSH2, MSH3, MSH6, MUTYH, NBN, NF1, NF2, NTHL1, PALB2, PHOX2B, PMS2, POT1, PRKAR1A, PTCH1, PTEN, RAD51C, RAD51D, RB1, RECQL, RET, SDHA, SDHAF2, SDHB, SDHC, SDHD, SMAD4, SMARCA4, SMARCB1, SMARCE1, STK11, SUFU, TMEM127, TP53, TSC1, TSC2, VHL and XRCC2 (sequencing and deletion/duplication); EGFR, EGLN1, HOXB13, KIT, MITF, PDGFRA, POLD1 and POLE (sequencing only); EPCAM and GREM1 (  deletion/duplication only). RNA data is routinely analyzed for use in variant interpretation for all genes. The test report will be scanned into EPIC and located under the Molecular Pathology section of the Results Review tab.  A portion of the result report is included below for reference.     We discussed with Briana Ochoa that because current genetic testing is not perfect, it is possible there may be a gene mutation in one of these genes that current testing cannot detect, but that chance is small.  We also discussed that there  could be another gene that has not yet been discovered, or that we have not yet tested, that is responsible for the cancer diagnoses in the family. It is also possible there is a hereditary cause for the cancer in the family that Briana Ochoa did not inherit and therefore was not identified in her testing.  Therefore, it is important to remain in touch with cancer genetics in the future so that we can continue to offer Briana Ochoa the most up to date genetic testing.   ADDITIONAL GENETIC TESTING: We discussed with Briana Ochoa that her genetic testing was fairly extensive.  If there are genes identified to increase cancer risk that can be analyzed in the future, we would be happy to discuss and coordinate this testing at that time.    CANCER SCREENING RECOMMENDATIONS: Briana Ochoa test result is considered negative (normal). This means that we have not identified a hereditary cause for her personal and family history of cancer at this time. While reassuring, this does not definitively rule out a hereditary predisposition to cancer. It is still possible that there could be genetic mutations that are undetectable by current technology. There could be genetic mutations in genes that have not been tested or identified to increase cancer risk.  Therefore, it is recommended she continue to follow the cancer management and screening guidelines provided by her oncology and primary healthcare provider.   An individual's cancer risk and medical management are not determined by genetic test results alone. Overall cancer risk assessment incorporates additional factors, including personal medical history, family history, and any available genetic information that may result in a personalized plan for cancer prevention and surveillance.  RECOMMENDATIONS FOR FAMILY MEMBERS:  Individuals in this family might be at some increased risk of developing cancer, over the general population risk, simply due to the  family history of cancer.  We recommended women in this family have a yearly mammogram beginning at age 45, or 50 years younger than the earliest onset of cancer, an annual clinical breast exam, and perform monthly breast self-exams. Women in this family should also have a gynecological exam as recommended by their primary provider. All family members should be referred for colonoscopy starting at age 74.  FOLLOW-UP: Lastly, we discussed with Briana Ochoa that cancer genetics is a rapidly advancing field and it is possible that new genetic tests will be appropriate for her and/or her family members in the future. We encouraged her to remain in contact with cancer genetics on an annual basis so we can update her personal and family histories and let her know of advances in cancer genetics that may benefit this family.   Our contact number was provided. Briana Ochoa questions were answered to her satisfaction, and she knows she is welcome to call us at anytime with additional questions or concerns.   Clint Guy, MS, [facility] Genetic Counselor [email or web address] Phone: [phone]

## 2020-08-04 NOTE — Telephone Encounter (Signed)
Revealed negative genetic testing. Discussed that we do not know why she has had multiple cancers or why there is cancer in the family. There could be a genetic mutation in the family that Briana Ochoa did not inherit. There could also be a mutation in a different gene that we are not testing, or our current technology may not be able to detect certain mutations. It will therefore be important for her to stay in contact with genetics to keep up with whether additional testing may be appropriate in the future.

## 2020-08-21 ENCOUNTER — Inpatient Hospital Stay: Payer: Medicare PPO | Attending: Hematology

## 2020-08-21 ENCOUNTER — Inpatient Hospital Stay: Payer: Medicare PPO | Admitting: Hematology

## 2020-08-21 ENCOUNTER — Telehealth: Payer: Self-pay | Admitting: Pharmacist

## 2020-08-21 ENCOUNTER — Other Ambulatory Visit (HOSPITAL_COMMUNITY): Payer: Self-pay

## 2020-08-21 ENCOUNTER — Other Ambulatory Visit: Payer: Self-pay

## 2020-08-21 VITALS — BP 134/72 | HR 67 | Temp 98.2°F | Resp 20 | Ht 64.0 in | Wt 166.3 lb

## 2020-08-21 DIAGNOSIS — D0512 Intraductal carcinoma in situ of left breast: Secondary | ICD-10-CM | POA: Insufficient documentation

## 2020-08-21 DIAGNOSIS — Z79899 Other long term (current) drug therapy: Secondary | ICD-10-CM | POA: Diagnosis not present

## 2020-08-21 DIAGNOSIS — Z7901 Long term (current) use of anticoagulants: Secondary | ICD-10-CM | POA: Insufficient documentation

## 2020-08-21 DIAGNOSIS — Z85820 Personal history of malignant melanoma of skin: Secondary | ICD-10-CM | POA: Insufficient documentation

## 2020-08-21 DIAGNOSIS — K449 Diaphragmatic hernia without obstruction or gangrene: Secondary | ICD-10-CM | POA: Diagnosis not present

## 2020-08-21 DIAGNOSIS — Z79811 Long term (current) use of aromatase inhibitors: Secondary | ICD-10-CM | POA: Insufficient documentation

## 2020-08-21 DIAGNOSIS — D125 Benign neoplasm of sigmoid colon: Secondary | ICD-10-CM | POA: Diagnosis not present

## 2020-08-21 DIAGNOSIS — K5730 Diverticulosis of large intestine without perforation or abscess without bleeding: Secondary | ICD-10-CM | POA: Insufficient documentation

## 2020-08-21 DIAGNOSIS — E785 Hyperlipidemia, unspecified: Secondary | ICD-10-CM | POA: Insufficient documentation

## 2020-08-21 DIAGNOSIS — Z8673 Personal history of transient ischemic attack (TIA), and cerebral infarction without residual deficits: Secondary | ICD-10-CM | POA: Insufficient documentation

## 2020-08-21 DIAGNOSIS — C186 Malignant neoplasm of descending colon: Secondary | ICD-10-CM

## 2020-08-21 DIAGNOSIS — E1122 Type 2 diabetes mellitus with diabetic chronic kidney disease: Secondary | ICD-10-CM | POA: Diagnosis not present

## 2020-08-21 DIAGNOSIS — I700 Atherosclerosis of aorta: Secondary | ICD-10-CM | POA: Insufficient documentation

## 2020-08-21 DIAGNOSIS — C679 Malignant neoplasm of bladder, unspecified: Secondary | ICD-10-CM | POA: Insufficient documentation

## 2020-08-21 DIAGNOSIS — N183 Chronic kidney disease, stage 3 unspecified: Secondary | ICD-10-CM | POA: Diagnosis not present

## 2020-08-21 DIAGNOSIS — K621 Rectal polyp: Secondary | ICD-10-CM | POA: Insufficient documentation

## 2020-08-21 DIAGNOSIS — I129 Hypertensive chronic kidney disease with stage 1 through stage 4 chronic kidney disease, or unspecified chronic kidney disease: Secondary | ICD-10-CM | POA: Diagnosis not present

## 2020-08-21 LAB — CMP (CANCER CENTER ONLY)
ALT: 11 U/L (ref 0–44)
AST: 17 U/L (ref 15–41)
Albumin: 3.5 g/dL (ref 3.5–5.0)
Alkaline Phosphatase: 92 U/L (ref 38–126)
Anion gap: 7 (ref 5–15)
BUN: 29 mg/dL — ABNORMAL HIGH (ref 8–23)
CO2: 27 mmol/L (ref 22–32)
Calcium: 9.8 mg/dL (ref 8.9–10.3)
Chloride: 107 mmol/L (ref 98–111)
Creatinine: 1.36 mg/dL — ABNORMAL HIGH (ref 0.44–1.00)
GFR, Estimated: 39 mL/min — ABNORMAL LOW (ref >60)
Glucose, Bld: 101 mg/dL — ABNORMAL HIGH (ref 70–99)
Potassium: 5.2 mmol/L — ABNORMAL HIGH (ref 3.5–5.1)
Sodium: 141 mmol/L (ref 135–145)
Total Bilirubin: <0.2 mg/dL — ABNORMAL LOW (ref 0.3–1.2)
Total Protein: 7 g/dL (ref 6.5–8.1)

## 2020-08-21 LAB — CBC WITH DIFFERENTIAL (CANCER CENTER ONLY)
Abs Immature Granulocytes: 0.04 10*3/uL (ref 0.00–0.07)
Basophils Absolute: 0.1 10*3/uL (ref 0.0–0.1)
Basophils Relative: 1 %
Eosinophils Absolute: 0.4 10*3/uL (ref 0.0–0.5)
Eosinophils Relative: 3 %
HCT: 39.5 % (ref 36.0–46.0)
Hemoglobin: 13 g/dL (ref 12.0–15.0)
Immature Granulocytes: 0 %
Lymphocytes Relative: 20 %
Lymphs Abs: 2.2 10*3/uL (ref 0.7–4.0)
MCH: 29.4 pg (ref 26.0–34.0)
MCHC: 32.9 g/dL (ref 30.0–36.0)
MCV: 89.4 fL (ref 80.0–100.0)
Monocytes Absolute: 1 10*3/uL (ref 0.1–1.0)
Monocytes Relative: 9 %
Neutro Abs: 7.2 10*3/uL (ref 1.7–7.7)
Neutrophils Relative %: 67 %
Platelet Count: 367 10*3/uL (ref 150–400)
RBC: 4.42 MIL/uL (ref 3.87–5.11)
RDW: 15.7 % — ABNORMAL HIGH (ref 11.5–15.5)
WBC Count: 10.9 10*3/uL — ABNORMAL HIGH (ref 4.0–10.5)
nRBC: 0 % (ref 0.0–0.2)

## 2020-08-21 LAB — IRON AND TIBC
Iron: 35 ug/dL — ABNORMAL LOW (ref 41–142)
Saturation Ratios: 10 % — ABNORMAL LOW (ref 21–57)
TIBC: 340 ug/dL (ref 236–444)
UIBC: 305 ug/dL (ref 120–384)

## 2020-08-21 LAB — FERRITIN: Ferritin: 32 ng/mL (ref 11–307)

## 2020-08-21 MED ORDER — CAPECITABINE 500 MG PO TABS
ORAL_TABLET | ORAL | 0 refills | Status: DC
  Filled 2020-08-21: qty 70, fill #0

## 2020-08-21 NOTE — Telephone Encounter (Signed)
Oral Oncology Pharmacist Encounter  Received notification from [facility] that prior authorization for Xeloda (capecitabine) is required.   PA submitted on Covermymeds Key [id number] Status is pending   Oral Oncology Clinic will continue to follow.  Leron Croak, PharmD, BCPS Hematology/Oncology Clinical Pharmacist [facility] [phone] 08/21/2020 3:54 PM

## 2020-08-21 NOTE — Progress Notes (Signed)
Briana Ochoa   Telephone:([phone] Fax:(336) [phone]   Clinic Follow up Note   Patient Care Team: Nche, [name], NP as PCP - General (Internal Medicine) Fanny Skates, MD as Consulting Physician (General Surgery) Thea Silversmith, MD as Consulting Physician (Radiation Oncology) Nicholas Lose, MD as Consulting Physician (Hematology and Oncology) Garvin Fila, MD as Consulting Physician (Neurology) Consuella Lose, MD as Consulting Physician (Neurosurgery) Katy Apo, MD as Consulting Physician (Ophthalmology) Jonnie Finner, RN (Inactive) as Oncology Nurse Navigator Truitt Merle, MD as Consulting Physician (Hematology and Oncology) Leighton Ruff, MD as Consulting Physician (General Surgery)  Date of Service:  08/21/2020  CHIEF COMPLAINT: f/u of colon cancer, h/o left breast DCIS  SUMMARY OF ONCOLOGIC HISTORY: Oncology History Overview Note  Cancer Staging Cancer of left colon [facility]) Staging form: Colon and Rectum, AJCC 8th Edition - Pathologic stage from 05/28/2020: Stage IIA (pT3, pN0, cM0) - Signed by Truitt Merle, MD on 06/19/2020 Stage prefix: Initial diagnosis Total positive nodes: 0 Histologic grading system: 4 grade system Histologic grade (G): G3 Residual tumor (R): R0 - None  Ductal carcinoma in situ (DCIS) of left breast Staging form: Breast, AJCC 7th Edition - Clinical: Stage 0 (Tis (DCIS), N0, cM0) - Unsigned Specimen type: Core Needle Biopsy Histopathologic type: 9932 Laterality: Left Staging comments: Staged at breast conference 7.22.15  - Pathologic: Stage 0 (Tis (DCIS)(2), N0, cM0) - Unsigned Specimen type: Core Needle Biopsy Histopathologic type: 9932 Laterality: Left Tumor size (mm): 3 Multiple tumors: Yes Number of tumors: 2 Method of lymph node assessment: Clinical Method of detection of distant metastases: Clinical Residual tumor (R): R0 - None Estrogen receptor status: Positive Progesterone receptor status:  Positive    Ductal carcinoma in situ (DCIS) of left breast  08/20/2013 Initial Diagnosis   Left breast 12:00: DCIS with calcifications, yet 100%, PR 100%; 09/04/2013 second group of calcifications also biopsied proven to be DCIS ER/PR positive    09/19/2013 Surgery   Left breast lumpectomy DCIS 2 foci margins negative, 0.2 and 0.3 cm ER 100% PR 100%    10/08/2013 - 11/12/2013 Radiation Therapy   Adjuvant radiation therapy    11/12/2013 - 2020 Anti-estrogen oral therapy   Anastrozole 1 mg daily plan is for 5 years    01/09/2014 - 01/10/2014 Hospital Admission   Pipeline embolization 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 The report date is 07/30/2020.   The CancerNext-Expanded + RNAinsight gene panel offered by [employer] and includes sequencing and rearrangement analysis for the following 77 genes: AIP, ALK, APC, ATM, AXIN2, BAP1, BARD1, BLM, BMPR1A, BRCA1, BRCA2, BRIP1, CDC73, CDH1, CDK4, CDKN1B, CDKN2A, CHEK2, CTNNA1, DICER1, FANCC, FH, FLCN, GALNT12, KIF1B, LZTR1, MAX, MEN1, MET, MLH1, MSH2, MSH3, MSH6, MUTYH, NBN, NF1, NF2, NTHL1, PALB2, PHOX2B, PMS2, POT1, PRKAR1A, PTCH1, PTEN, RAD51C, RAD51D, RB1, RECQL, RET, SDHA, SDHAF2, SDHB, SDHC, SDHD, SMAD4, SMARCA4, SMARCB1, SMARCE1, STK11, SUFU, TMEM127, TP53, TSC1, TSC2, VHL and XRCC2 (sequencing and deletion/duplication); EGFR, EGLN1, HOXB13, KIT, MITF, PDGFRA, POLD1 and POLE (sequencing only); EPCAM and GREM1 (deletion/duplication only). RNA data is routinely analyzed for use in variant interpretation for all genes.   Bladder cancer ([place])  02/09/2018 Initial Diagnosis   Bladder cancer ([place])    07/30/2020 Genetic Testing   Negative genetic testing:  No pathogenic variants detected on the [employer] CancerNext-Expanded + RNAinsight panel. The report date is 07/30/2020.   The CancerNext-Expanded +  RNAinsight gene  panel offered by [employer] and includes sequencing and rearrangement analysis for the following 77 genes: AIP, ALK, APC, ATM, AXIN2, BAP1, BARD1, BLM, BMPR1A, BRCA1, BRCA2, BRIP1, CDC73, CDH1, CDK4, CDKN1B, CDKN2A, CHEK2, CTNNA1, DICER1, FANCC, FH, FLCN, GALNT12, KIF1B, LZTR1, MAX, MEN1, MET, MLH1, MSH2, MSH3, MSH6, MUTYH, NBN, NF1, NF2, NTHL1, PALB2, PHOX2B, PMS2, POT1, PRKAR1A, PTCH1, PTEN, RAD51C, RAD51D, RB1, RECQL, RET, SDHA, SDHAF2, SDHB, SDHC, SDHD, SMAD4, SMARCA4, SMARCB1, SMARCE1, STK11, SUFU, TMEM127, TP53, TSC1, TSC2, VHL and XRCC2 (sequencing and deletion/duplication); EGFR, EGLN1, HOXB13, KIT, MITF, PDGFRA, POLD1 and POLE (sequencing only); EPCAM and GREM1 (deletion/duplication only). RNA data is routinely analyzed for use in variant interpretation for all genes.   Cancer of left colon ([place])  04/23/2020 Procedure   Upper Endoscopy and Colonoscopy by Dr Carlean Purl IMPRESSION Erythematous mucosa in the antrum. Biopsied. - Small hiatal hernia. - Gastroesophageal flap valve classified as Hill Grade IV (no fold, wide open lumen, hiatal hernia present). - The examination was otherwise normal.   IMPRESSION - Decreased sphincter tone found on digital rectal exam. - Malignant partially obstructing tumor in the proximal descending colon. Biopsied. Tattooed. - Three 5 to 20 mm polyps in the sigmoid colon, removed piecemeal using a cold snare. Resected and retrieved. - Severe diverticulosis in the sigmoid colon. - I left several diminutive descending (proximal to tattoo), sigmoid and rectal polyps - The examination was otherwise normal on direct and retroflexion views.     04/23/2020 Initial Diagnosis   Diagnosis 1. Descending Colon Polyp, mass - ADENOCARCINOMA. 2. Sigmoid Colon Polyp, (3) - MULTIPLE FRAGMENTS OF TUBULAR ADENOMA. - MULTIPLE FRAGMENTS OF SESSILE SERRATED POLYP WITHOUT DYSPLASIA. - NO HIGH GRADE DYSPLASIA OR MALIGNANCY. Microscopic Comment 1. Dr. Vic Ripper has reviewed  the case. Dr. Carlean Purl was notified on 04/24/2020. 2. Despite being left sided the fragments have features of a sessile serrated polyp, as opposed to, hyperplastic polyp.   04/27/2020 Imaging   CT AP IMPRESSION: Bulky annular soft tissue mass involving the distal transverse colon with extension into adjacent pericolonic fat, consistent with primary colon carcinoma.   No evidence of metastatic disease.   Colonic diverticulosis. No radiographic evidence of diverticulitis.   Aortic Atherosclerosis (ICD10-I70.0).     05/05/2020 Imaging   CT Chest IMPRESSION: Bulky annular soft tissue mass involving the distal transverse colon with extension into adjacent pericolonic fat, consistent with primary colon carcinoma.   No evidence of metastatic disease.   Colonic diverticulosis. No radiographic evidence of diverticulitis.   Aortic Atherosclerosis (ICD10-I70.0).     05/28/2020 Initial Diagnosis   Cancer of left colon ([place])    05/28/2020 Cancer Staging   Staging form: Colon and Rectum, AJCC 8th Edition - Pathologic stage from 05/28/2020: Stage IIA (pT3, pN0, cM0) - Signed by Truitt Merle, MD on 06/19/2020  Stage prefix: Initial diagnosis  Total positive nodes: 0  Histologic grading system: 4 grade system  Histologic grade (G): G3  Residual tumor (R): R0 - None    05/28/2020 Surgery   XI ROBOT ASSISTED RESECTION OF SPLENIC FLEXURE by Dr Marcello Moores    FINAL MICROSCOPIC DIAGNOSIS:   A. COLON, SPLENIC FLEXURE, RESECTION:  - Invasive adenocarcinoma, poorly differentiated, spanning 9.7 cm.  - Tumor invades through muscularis propria into pericolonic tissue.  - Resection margins are negative.  - Tubular adenoma (X1).  - Sessile serrate polyp without dysplasia (x2).  - Diverticulosis.  - Polypectomy scar.  - Twenty-four of twenty-four lymph nodes negative for carcinoma (0/24).  - See oncology table.  07/07/2020 Miscellaneous   Guardant Reveal ctDNA positive MSI high   07/30/2020  Genetic Testing   Negative genetic testing:  No pathogenic variants detected on the [employer] CancerNext-Expanded + RNAinsight panel. The report date is 07/30/2020.   The CancerNext-Expanded + RNAinsight gene panel offered by [employer] and includes sequencing and rearrangement analysis for the following 77 genes: AIP, ALK, APC, ATM, AXIN2, BAP1, BARD1, BLM, BMPR1A, BRCA1, BRCA2, BRIP1, CDC73, CDH1, CDK4, CDKN1B, CDKN2A, CHEK2, CTNNA1, DICER1, FANCC, FH, FLCN, GALNT12, KIF1B, LZTR1, MAX, MEN1, MET, MLH1, MSH2, MSH3, MSH6, MUTYH, NBN, NF1, NF2, NTHL1, PALB2, PHOX2B, PMS2, POT1, PRKAR1A, PTCH1, PTEN, RAD51C, RAD51D, RB1, RECQL, RET, SDHA, SDHAF2, SDHB, SDHC, SDHD, SMAD4, SMARCA4, SMARCB1, SMARCE1, STK11, SUFU, TMEM127, TP53, TSC1, TSC2, VHL and XRCC2 (sequencing and deletion/duplication); EGFR, EGLN1, HOXB13, KIT, MITF, PDGFRA, POLD1 and POLE (sequencing only); EPCAM and GREM1 (deletion/duplication only). RNA data is routinely analyzed for use in variant interpretation for all genes.   09/02/2020 -  Chemotherapy    Patient is on Treatment Plan: COLORECTAL PEMBROLIZUMAB Q21D          CURRENT THERAPY:  Pending Xeloda and Keytruda  INTERVAL HISTORY:  Briana Ochoa is here for a follow up of colon cancer. She was last seen by me on 06/19/20. She presents to the clinic alone. She reports she is feeling well today, "better than I have in years." She denies any new symptoms. 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 She notes her daughter lives with her. She was working during [employer] today.   All other systems were reviewed with the patient and are negative.  MEDICAL HISTORY:  Past Medical History:  Diagnosis Date   Allergy    seasonal allergies   Anemia    on meds   Aortic atherosclerosis (H[facility])    Bladder cancer ([place]) 2020   Bladder tumor    Bloody diarrhea 09/29/2017   Breast cancer ([place]) 2015   Left Breast Cancer   Cataract     bilateral -sx   CKD (chronic kidney disease), stage III ([place])    DM related   Colon cancer ([place])    Diabetes mellitus type 2, diet-controlled ([place])    diet controlled   Dyspnea 09/28/2017   Dysuria    Family history of adverse reaction to anesthesia    aunt- N/V    Family history of breast cancer    Family history of kidney cancer    Family history of throat cancer    Family history of uterine cancer    Fatigue 09/28/2017   Frequency of urination    Grade I diastolic dysfunction 01/26/7587   Noted on ECHO   Gross hematuria 09/29/2017   Heart murmur    since rheumatic fever as child   Hematuria    History of cardiomegaly 02/12/2005   Mild, noted on CXR   History of CVA (cerebrovascular accident) 08/05/2014   post op cerebral angiogram, cerebral thrombosis w/ cerebral infarction;  11-02-2017  per pt no residuals   History of malignant melanoma of skin 12/2015   s/p wide local excision right lower leg (per pt localized)   History of rheumatic fever as a child    Hyperlipidemia    on meds   Hypertension    on meds   Intracranial aneurysm dx 07/ 2015   paraophthalmic RICA aneurysm /   s/p  Pipeline embolization right ICA 01-09-2014   Jaundice 09/28/2017   Malignant neoplasm of  central portion of left breast in female, estrogen receptor positive [facility]) oncologist-  dr Lindi Adie   dx 07/ 2015--- DCIS, ER/PR positive-- s/p  left breast lumpectomy 09-19-2013,  completed radiation 11-12-2013,  started arimidex 11-12-2013   Melanoma ([place])    righ tleg    OA (osteoarthritis)    knees, hands, R shoulder   Osteopenia 10/16/2012   Diffuse   Personal history of radiation therapy    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   Sigmoid diverticulosis    Weakness 09/28/2017   Wears dentures    upper   Wears glasses    Wears hearing aid in both ears     SURGICAL HISTORY: Past Surgical History:  Procedure Laterality Date   BREAST LUMPECTOMY Left 2015   BREAST LUMPECTOMY WITH  NEEDLE LOCALIZATION Left 09/19/2013   Procedure: LEFT BREAST LUMPECTOMY WITH DOUBLE WIRE BRACKETED  NEEDLE LOCALIZATION;  Surgeon: [name], MD;  Location: [facility];  Service: General;  Laterality: Left;   CATARACT EXTRACTION W/ INTRAOCULAR LENS  IMPLANT, BILATERAL  06/2015   CESAREAN SECTION  1964   CHOLECYSTECTOMY OPEN  1970s   CYSTOSCOPY W/ URETERAL STENT PLACEMENT Left 11/07/2017   Procedure: CYSTOSCOPY WITH RETROGRADE PYELOGRAM/URETERAL STENT PLACEMENT;  Surgeon: Cleon Gustin, MD;  Location: [facility];  Service: Urology;  Laterality: Left;   CYSTOSCOPY W/ URETERAL STENT PLACEMENT Bilateral 04/27/2018   Procedure: CYSTOSCOPY WITH RETROGRADE PYELOGRAM;  Surgeon: Cleon Gustin, MD;  [facility];  Service: Urology;  Laterality: Bilateral;   FINGER SURGERY     lt thumb  from dog bite   IR  NEPHROURETERAL CATH PLACE LEFT  04/28/2018   MELANOMA EXCISION Right 12/28/2015   Procedure: EXCISION MELANOMA RIGHT LOWER LEG;  Surgeon: Fanny Skates, MD;  Location: [place];  Service: General;  Laterality: Right;   RADIOLOGY WITH ANESTHESIA N/A 01/09/2014   Procedure: Embolization;  Surgeon: Consuella Lose, MD;  Location: [place] [facility];  Service: Radiology;  Laterality: N/A;   SPLENECTOMY, TOTAL  2009 approx.   splen injury due to fall off horse   TONSILLECTOMY  child   TRANSURETHRAL RESECTION OF BLADDER TUMOR N/A 11/07/2017   Procedure: TRANSURETHRAL RESECTION OF BLADDER TUMOR (TURBT), POSSIBLE STENT PLACEMENT;  Surgeon: Cleon Gustin, MD;  Location: [facility];  Service: Urology;  Laterality: N/A;   TRANSURETHRAL RESECTION OF BLADDER TUMOR N/A 12/14/2017   Procedure: TRANSURETHRAL RESECTION OF BLADDER TUMOR (TURBT);  Surgeon: Cleon Gustin, MD;  Location: [facility];  Service: Urology;  Laterality: N/A;  [place] TUMOR N/A 04/27/2018   Procedure:  TRANSURETHRAL RESECTION OF BLADDER TUMOR (TURBT);  Surgeon: Cleon Gustin, MD;  Location: [facility];  Service: Urology;  Laterality: N/A;  39 MINS    I have reviewed the social history and family history with the patient and they are unchanged from previous note.  ALLERGIES:  is allergic to feraheme [ferumoxytol].  MEDICATIONS:  Current Outpatient Medications  Medication Sig Dispense Refill   capecitabine (XELODA) 500 MG tablet Take 2 tabs in morning and 3 tabs in evening, every 12 hours, 14 days on and 7 days off 70 tablet 0   acetaminophen (TYLENOL) 500 MG tablet Take 1,000 mg by mouth daily.     apixaban (ELIQUIS) 5 MG TABS tablet Take 34m BID x7days, then 579mBID continuously, with food (Patient taking differently: Take 5-10 mg by mouth See  admin instructions. Take 33m twice daily x7days, then 536mtwice daily continuously, with food) 180 tablet 0   bismuth subsalicylate (PEPTO BISMOL) 262 MG/15ML suspension Take 30 mLs by mouth every 6 (six) hours as needed for diarrhea or loose stools.     ferrous gluconate (FERGON) 324 MG tablet Take 1 tablet (324 mg total) by mouth 2 (two) times daily with a meal. 180 tablet 0   glucose blood (TRUE METRIX BLOOD GLUCOSE TEST) test strip Use as instructed to test blood sugar once daily E08.638 100 each 2   lisinopril (ZESTRIL) 20 MG tablet TAKE 1 TABLET EVERY DAY (Patient taking differently: Take 20 mg by mouth daily.) 90 tablet 3   pravastatin (PRAVACHOL) 10 MG tablet Take 1 tablet (10 mg total) by mouth daily. 90 tablet 3   traMADol (ULTRAM) 50 MG tablet Take 1 tablet (50 mg total) by mouth every 6 (six) hours as needed for moderate pain. 25 tablet 0   No current facility-administered medications for this visit.    PHYSICAL EXAMINATION: ECOG PERFORMANCE STATUS: 0 - Asymptomatic  Vitals:   08/21/20 1300  BP: 134/72  Pulse: 67  Resp: 20  Temp: 98.2 F (36.8 C)  SpO2: 97%   Filed Weights   08/21/20 1300  Weight: 166  lb 4.8 oz (75.4 kg)    GENERAL:alert, no distress and comfortable SKIN: skin color, texture, turgor are normal, no rashes or significant lesions EYES: normal, Conjunctiva are pink and non-injected, sclera clear  NECK: supple, thyroid normal size, non-tender, without nodularity LYMPH:  no palpable lymphadenopathy in the cervical, axillary  LUNGS: clear to auscultation and percussion with normal breathing effort HEART: regular rate & rhythm and no murmurs and no lower extremity edema ABDOMEN:abdomen soft, non-tender and normal bowel sounds Musculoskeletal:no cyanosis of digits and no clubbing  NEURO: alert & oriented x 3 with fluent speech, no focal motor/sensory deficits  LABORATORY DATA:  I have reviewed the data as listed CBC Latest Ref Rng & Units 08/21/2020 07/07/2020 06/19/2020  WBC 4.0 - 10.5 K/uL 10.9(H) 10.3 10.2  Hemoglobin 12.0 - 15.0 g/dL 13.0 11.7(L) 10.6(L)  Hematocrit 36.0 - 46.0 % 39.5 37.5 35.5(L)  Platelets 150 - 400 K/uL 367 410(H) 587(H)     CMP Latest Ref Rng & Units 07/07/2020 06/19/2020 05/31/2020  Glucose 70 - 99 mg/dL 94 94 122(H)  BUN 8 - 23 mg/dL 21 20 16   Creatinine 0.44 - 1.00 mg/dL 1.21(H) 1.36(H) 1.23(H)  Sodium 135 - 145 mmol/L 140 140 137  Potassium 3.5 - 5.1 mmol/L 4.5 5.0 4.5  Chloride 98 - 111 mmol/L 107 106 106  CO2 22 - 32 mmol/L 24 27 24   Calcium 8.9 - 10.3 mg/dL 9.5 10.0 8.5(L)  Total Protein 6.5 - 8.1 g/dL 6.6 6.8 -  Total Bilirubin 0.3 - 1.2 mg/dL 0.2(L) <0.2(L) -  Alkaline Phos 38 - 126 U/L 73 85 -  AST 15 - 41 U/L 15 15 -  ALT 0 - 44 U/L 8 <6 -      RADIOGRAPHIC STUDIES: I have personally reviewed the radiological images as listed and agreed with the findings in the report. No results found.   ASSESSMENT & PLAN:  Briana Ochoa a [age].o. female with   1. Colon cancer in Splenic Flexure, pT3N0M0, Stage II, MSI-High -I dicussed image findings and pathology with patient and her daughter today. -After months of worsened  anemia, work up in ED showed GI blood loss. 04/23/20 Colonoscopy showed her to have adenocarcinoma  of descending colon. -She underwent surgical resection with Dr Marcello Moores on 05/28/20. Surgical pathology showed 9.7cm mass in splenic flexure with invasive adenocarcinoma, poorly differentiated was completely removed, negative margins, negative LNs, Stage II. She has intermediate risk of recurrence.  -Guardant reveal performed 07/07/20 was positive, which predicts a very high risk of recurrence in the near future.  I reviewed his results with her in detail. -I recommend PET scan in the next few weeks for further evaluation, to see if there is any evidence of recurrence on scan, also I suspect she has microscopic disease, which would not show up on scan. -My recommendation for her is to proceed with adjuvant Xeloda (oral).  Due to her age and concern of quality of life, she is not a good candidate for FOLFOX or Troup ox.  Due to her MSI high disease, and microscopic metastatic disease, I recommend adding Keytruda (IV). I think she would tolerate this regimen better than chemotherapy. I discussed that Beryle Flock is generally used for patients with metastatic colon cancer, but it will work for her microscopic metastasis and avoid more toxic systemic chemo. I printed out some reading materials for her to share with her daughter Suanne Marker. --Chemotherapy consent: Side effects including but does not not limited to, fatigue, nausea, vomiting, diarrhea, hair loss, neuropathy, fluid retention, renal and kidney dysfunction, neutropenic fever, needed for blood transfusion, bleeding, pneumonitis, colitis, thyroid dysfunction, other endocrine disorder, were discussed with patient in great detail. She agrees to proceed. -We will plan to start treatment after her scan.    2. Anemia of iron deficiency  -Secondary to #1 with GI blood loss -She required Blood transfusion and IV Iron. She did have reaction at start of infusion. -I  recommended she increase her oral iron to TID on 06/19/20. -anemia much improved since surgery-- iron 46, ferritin 60, hgb 11.7 on 07/07/20. Hgb normal today (08/21/20)   3. H/o of Left breast DCIS, H/o Bladder cancer, H/o Melanoma in 2017 of right lower leg -Diagnosed with DCIS in 2015, S/p left lumpectomy, Adjuvant Radiation and 5 years of Anastrozole. Treated by Dr Lindi Adie. -Diagnosed with bladder cancer in 02/2018, early stage, S/p surgery  -She will continue surveillance.   4. Genetic Testing -Given her personably history of cancer and family history of cancer, I recommend genetic testing.  -performed 07/07/20, results negative   5. Comorbidites: CKD, DM, Heart murmur, H/o mini stroke from Brain aneurysm, HTN, Osteopenia -Continue medications and to f/u with other physicians for management.      PLAN:  I will reach out to her daughter Suanne Marker later this afternoon to discuss our treatment plan -I ordered PET scan to be performed in 1-2 weeks If insurance denies, we can do CT w/o contrast due to her CKD -Plan to proceed with Xeloda and Keytruda following scan results. F/u and start dates pending date of scan. -I called in Xeloda today, due to her CKD and advanced age, first cycle will start at 1000 mg in the morning, 1500 mg in the evening, on day 1-14, every 21 days.    No problem-specific Assessment & Plan notes found for this encounter.   Orders Placed This Encounter  Procedures   NM PET Image Initial (PI) Skull Base To Thigh    Positive GuardantReveal, suspect colon cancer recurrence    Standing Status:   Future    Standing Expiration Date:   08/21/2021    Order Specific Question:   If indicated for the ordered procedure, I authorize the administration  of a radiopharmaceutical per Radiology protocol    Answer:   Yes    Order Specific Question:   Preferred imaging location?    Answer:   [facility]    All questions were answered. The patient knows to call the clinic with any  problems, questions or concerns. No barriers to learning was detected. The total time spent in the appointment was 40 minutes.     Truitt Merle, MD 08/21/2020   I, Wilburn Mylar, am acting as scribe for Truitt Merle, MD.   I have reviewed the above documentation for accuracy and completeness, and I agree with the above.

## 2020-08-21 NOTE — Progress Notes (Signed)
START ON PATHWAY REGIMEN - Colorectal     A cycle is every 21 days:     Pembrolizumab   **Always confirm dose/schedule in your pharmacy ordering system**  Patient Characteristics: Distant Metastases, Nonsurgical Candidate, KRAS/NRAS Wild-Type (BRAF V600 Wild-Type/Unknown), Targeted/Immunotherapy (MSI-H/dMMR or HER2-Amplified), MSI-H/dMMR Tumor Location: Colon Therapeutic Status: Distant Metastases Microsatellite/Mismatch Repair Status: MSI-H/dMMR BRAF Mutation Status: Wild-Type (no mutation) KRAS/NRAS Mutation Status: Wild-Type (no mutation) Preferred Therapy Approach: Targeted/Immunotherapy (MSI-H/dMMR or  HER2-Amplified) HER2 Status: Awaiting Test Results Intent of Therapy: Curative Intent, Discussed with Patient

## 2020-08-21 NOTE — Telephone Encounter (Signed)
Oral Oncology Pharmacist Encounter  Received new prescription for Xeloda (capecitabine) for the adjuvant treatment of stage II colon cancer in conjunction with pembrolizumab, planned duration of Xeloda likely 6 months.  Prescription dose and frequency assessed for appropriateness. Dose reduced given patient's baseline renal dysfunction (CrCl ~39 mL/min)  CBC w/ Diff and CMP from 08/21/20 assessed, noted patient's Scr of 1.36 mg/dL (CrCl ~39 mL/min), dose adjusted accordingly. No further dose adjustments required at this time.   Current medication list in Epic reviewed, no relevant/significant DDIs with Xeloda identified.  Evaluated chart and no patient barriers to medication adherence noted.   Patient agreement for treatment documented in MD note on 08/21/20.  Prescription has been e-scribed to the [facility] for benefits analysis and approval.  Oral Oncology Clinic will continue to follow for insurance authorization, copayment issues, initial counseling and start date.  Leron Croak, PharmD, BCPS Hematology/Oncology Clinical Pharmacist [facility] [phone] 08/21/2020 4:03 PM

## 2020-08-24 ENCOUNTER — Other Ambulatory Visit: Payer: Self-pay | Admitting: Hematology

## 2020-08-24 ENCOUNTER — Other Ambulatory Visit (HOSPITAL_COMMUNITY): Payer: Self-pay

## 2020-08-24 ENCOUNTER — Telehealth: Payer: Self-pay | Admitting: Hematology

## 2020-08-24 ENCOUNTER — Encounter: Payer: Self-pay | Admitting: Hematology

## 2020-08-24 DIAGNOSIS — C186 Malignant neoplasm of descending colon: Secondary | ICD-10-CM

## 2020-08-24 NOTE — Telephone Encounter (Signed)
I got a call from Dr. Daisy Blossom from her insurance company, PET scan is denied. I will change to CT CAP wo contrast (due to her CKD) to be done in next few weeks.   Truitt Merle  08/24/2020

## 2020-08-31 ENCOUNTER — Other Ambulatory Visit: Payer: Self-pay

## 2020-08-31 ENCOUNTER — Ambulatory Visit (HOSPITAL_COMMUNITY)
Admission: RE | Admit: 2020-08-31 | Discharge: 2020-08-31 | Disposition: A | Discharge status: Home / Self Care | Payer: Medicare PPO | Source: Ambulatory Visit | Attending: Hematology | Admitting: Hematology

## 2020-08-31 DIAGNOSIS — I868 Varicose veins of other specified sites: Secondary | ICD-10-CM | POA: Diagnosis not present

## 2020-08-31 DIAGNOSIS — C186 Malignant neoplasm of descending colon: Secondary | ICD-10-CM | POA: Diagnosis not present

## 2020-08-31 DIAGNOSIS — K449 Diaphragmatic hernia without obstruction or gangrene: Secondary | ICD-10-CM | POA: Diagnosis not present

## 2020-08-31 DIAGNOSIS — I2510 Atherosclerotic heart disease of native coronary artery without angina pectoris: Secondary | ICD-10-CM | POA: Diagnosis not present

## 2020-08-31 DIAGNOSIS — K5730 Diverticulosis of large intestine without perforation or abscess without bleeding: Secondary | ICD-10-CM | POA: Diagnosis not present

## 2020-08-31 DIAGNOSIS — M47816 Spondylosis without myelopathy or radiculopathy, lumbar region: Secondary | ICD-10-CM | POA: Diagnosis not present

## 2020-09-01 ENCOUNTER — Telehealth: Payer: Self-pay | Admitting: Nurse Practitioner

## 2020-09-01 NOTE — Telephone Encounter (Signed)
What is the name of the medication? apixaban (ELIQUIS) 5 MG TABS tablet [id number]   Have you contacted your pharmacy to request a refill? Pt is needing a refill on this script.   Which pharmacy would you like this sent to? [facility].    Patient notified that their request is being sent to the clinical staff for review and that they should receive a call once it is complete. If they do not receive a call within 72 hours they can check with their pharmacy or our office.

## 2020-09-02 NOTE — Telephone Encounter (Signed)
Please call and have patient schedule appointment

## 2020-09-02 NOTE — Telephone Encounter (Signed)
Last pharmacy note stated patient was having some blood in stool with this Rx. Please advise, pt is requesting a refill.

## 2020-09-09 ENCOUNTER — Other Ambulatory Visit: Payer: Self-pay | Admitting: Nurse Practitioner

## 2020-09-09 DIAGNOSIS — I10 Essential (primary) hypertension: Secondary | ICD-10-CM

## 2020-09-10 ENCOUNTER — Other Ambulatory Visit: Payer: Self-pay | Admitting: Nurse Practitioner

## 2020-09-10 ENCOUNTER — Ambulatory Visit (HOSPITAL_COMMUNITY): Payer: Medicare PPO

## 2020-09-10 DIAGNOSIS — E782 Mixed hyperlipidemia: Secondary | ICD-10-CM

## 2020-09-11 NOTE — Telephone Encounter (Signed)
Chart supports Rx 

## 2020-09-15 ENCOUNTER — Other Ambulatory Visit (HOSPITAL_COMMUNITY): Payer: Self-pay

## 2020-09-15 MED ORDER — CAPECITABINE 500 MG PO TABS
ORAL_TABLET | ORAL | 0 refills | Status: DC
Start: 2020-09-15 — End: 2020-10-16
  Filled 2020-09-15: qty 70, fill #0
  Filled 2020-09-16: qty 70, 21d supply, fill #0

## 2020-09-15 NOTE — Telephone Encounter (Signed)
Oral Chemotherapy Pharmacist Encounter   Attempted to reach patient to provide update and offer for initial counseling on oral medication: Xeloda (capecitabine).   No answer. Left voicemail for patient to call back to discuss details of medication acquisition and initial counseling session.  Leron Croak, PharmD, BCPS Hematology/Oncology Clinical Pharmacist [facility] [phone] 09/15/2020 11:24 AM

## 2020-09-16 ENCOUNTER — Other Ambulatory Visit (HOSPITAL_COMMUNITY): Payer: Self-pay

## 2020-09-16 NOTE — Telephone Encounter (Signed)
Oral Chemotherapy Pharmacist Encounter   Spoke with patient today to follow up regarding patient's oral chemotherapy medication: Xeloda (capecitabine)  Patient will pick this up from the [facility] on 09/21/20 after MD appointment. Copay is $14.27 for first fill of Xeloda.   Will counsel patient on Xeloda (capecitabine) at that time.   Patient knows to call the office with questions or concerns.  Leron Croak, PharmD, BCPS Hematology/Oncology Clinical Pharmacist [facility] [phone] 09/16/2020 12:41 PM

## 2020-09-17 ENCOUNTER — Other Ambulatory Visit (HOSPITAL_COMMUNITY): Payer: Self-pay

## 2020-09-18 NOTE — Progress Notes (Addendum)
..  Patient is receiving Assistance Medication - Supplied Externally. Medication: Keytruda (Pembrolizumab) Manufacture: [employer] Approval Dates: Approved from 09/17/2020 until 02/06/2021. ID: [id number] Reason: Insurance Denial First DOS: 09/23/2020 .Marland KitchenJuan Quam, CPhT IV Drug Replacement Specialist [place] Phone: [phone]

## 2020-09-21 ENCOUNTER — Inpatient Hospital Stay: Payer: Medicare PPO

## 2020-09-21 ENCOUNTER — Other Ambulatory Visit: Payer: Self-pay

## 2020-09-21 ENCOUNTER — Inpatient Hospital Stay: Payer: Medicare PPO | Attending: Hematology | Admitting: Hematology

## 2020-09-21 ENCOUNTER — Other Ambulatory Visit (HOSPITAL_COMMUNITY): Payer: Self-pay

## 2020-09-21 ENCOUNTER — Encounter: Payer: Self-pay | Admitting: Hematology

## 2020-09-21 VITALS — BP 151/59 | HR 63 | Temp 97.8°F | Resp 18 | Ht 64.0 in | Wt 170.1 lb

## 2020-09-21 DIAGNOSIS — Z8049 Family history of malignant neoplasm of other genital organs: Secondary | ICD-10-CM | POA: Insufficient documentation

## 2020-09-21 DIAGNOSIS — I700 Atherosclerosis of aorta: Secondary | ICD-10-CM | POA: Diagnosis not present

## 2020-09-21 DIAGNOSIS — C186 Malignant neoplasm of descending colon: Secondary | ICD-10-CM | POA: Insufficient documentation

## 2020-09-21 DIAGNOSIS — Z808 Family history of malignant neoplasm of other organs or systems: Secondary | ICD-10-CM | POA: Diagnosis not present

## 2020-09-21 DIAGNOSIS — I2510 Atherosclerotic heart disease of native coronary artery without angina pectoris: Secondary | ICD-10-CM | POA: Insufficient documentation

## 2020-09-21 DIAGNOSIS — D500 Iron deficiency anemia secondary to blood loss (chronic): Secondary | ICD-10-CM | POA: Insufficient documentation

## 2020-09-21 DIAGNOSIS — N183 Chronic kidney disease, stage 3 unspecified: Secondary | ICD-10-CM | POA: Diagnosis not present

## 2020-09-21 DIAGNOSIS — Z85820 Personal history of malignant melanoma of skin: Secondary | ICD-10-CM | POA: Insufficient documentation

## 2020-09-21 DIAGNOSIS — M5136 Other intervertebral disc degeneration, lumbar region: Secondary | ICD-10-CM | POA: Diagnosis not present

## 2020-09-21 DIAGNOSIS — E1122 Type 2 diabetes mellitus with diabetic chronic kidney disease: Secondary | ICD-10-CM | POA: Diagnosis not present

## 2020-09-21 DIAGNOSIS — M47816 Spondylosis without myelopathy or radiculopathy, lumbar region: Secondary | ICD-10-CM | POA: Insufficient documentation

## 2020-09-21 DIAGNOSIS — Z79899 Other long term (current) drug therapy: Secondary | ICD-10-CM | POA: Insufficient documentation

## 2020-09-21 DIAGNOSIS — Z803 Family history of malignant neoplasm of breast: Secondary | ICD-10-CM | POA: Insufficient documentation

## 2020-09-21 DIAGNOSIS — Z5112 Encounter for antineoplastic immunotherapy: Secondary | ICD-10-CM | POA: Insufficient documentation

## 2020-09-21 DIAGNOSIS — Z8051 Family history of malignant neoplasm of kidney: Secondary | ICD-10-CM | POA: Insufficient documentation

## 2020-09-21 DIAGNOSIS — K922 Gastrointestinal hemorrhage, unspecified: Secondary | ICD-10-CM | POA: Insufficient documentation

## 2020-09-21 DIAGNOSIS — D0512 Intraductal carcinoma in situ of left breast: Secondary | ICD-10-CM | POA: Insufficient documentation

## 2020-09-21 DIAGNOSIS — I129 Hypertensive chronic kidney disease with stage 1 through stage 4 chronic kidney disease, or unspecified chronic kidney disease: Secondary | ICD-10-CM | POA: Insufficient documentation

## 2020-09-21 DIAGNOSIS — C679 Malignant neoplasm of bladder, unspecified: Secondary | ICD-10-CM | POA: Insufficient documentation

## 2020-09-21 LAB — CBC WITH DIFFERENTIAL (CANCER CENTER ONLY)
Abs Immature Granulocytes: 0.03 10*3/uL (ref 0.00–0.07)
Basophils Absolute: 0.1 10*3/uL (ref 0.0–0.1)
Basophils Relative: 1 %
Eosinophils Absolute: 0.4 10*3/uL (ref 0.0–0.5)
Eosinophils Relative: 4 %
HCT: 41.2 % (ref 36.0–46.0)
Hemoglobin: 13.4 g/dL (ref 12.0–15.0)
Immature Granulocytes: 0 %
Lymphocytes Relative: 24 %
Lymphs Abs: 2.3 10*3/uL (ref 0.7–4.0)
MCH: 29.3 pg (ref 26.0–34.0)
MCHC: 32.5 g/dL (ref 30.0–36.0)
MCV: 90.2 fL (ref 80.0–100.0)
Monocytes Absolute: 1 10*3/uL (ref 0.1–1.0)
Monocytes Relative: 11 %
Neutro Abs: 5.5 10*3/uL (ref 1.7–7.7)
Neutrophils Relative %: 60 %
Platelet Count: 337 10*3/uL (ref 150–400)
RBC: 4.57 MIL/uL (ref 3.87–5.11)
RDW: 15 % (ref 11.5–15.5)
WBC Count: 9.3 10*3/uL (ref 4.0–10.5)
nRBC: 0 % (ref 0.0–0.2)

## 2020-09-21 LAB — CMP (CANCER CENTER ONLY)
ALT: 14 U/L (ref 0–44)
AST: 16 U/L (ref 15–41)
Albumin: 3.6 g/dL (ref 3.5–5.0)
Alkaline Phosphatase: 85 U/L (ref 38–126)
Anion gap: 8 (ref 5–15)
BUN: 24 mg/dL — ABNORMAL HIGH (ref 8–23)
CO2: 26 mmol/L (ref 22–32)
Calcium: 9.6 mg/dL (ref 8.9–10.3)
Chloride: 108 mmol/L (ref 98–111)
Creatinine: 1.47 mg/dL — ABNORMAL HIGH (ref 0.44–1.00)
GFR, Estimated: 36 mL/min — ABNORMAL LOW (ref >60)
Glucose, Bld: 107 mg/dL — ABNORMAL HIGH (ref 70–99)
Potassium: 5.1 mmol/L (ref 3.5–5.1)
Sodium: 142 mmol/L (ref 135–145)
Total Bilirubin: 0.3 mg/dL (ref 0.3–1.2)
Total Protein: 6.9 g/dL (ref 6.5–8.1)

## 2020-09-21 LAB — IRON AND TIBC
Iron: 56 ug/dL (ref 41–142)
Saturation Ratios: 15 % — ABNORMAL LOW (ref 21–57)
TIBC: 376 ug/dL (ref 236–444)
UIBC: 320 ug/dL (ref 120–384)

## 2020-09-21 LAB — CEA (IN HOUSE-CHCC): CEA (CHCC-In House): 1.67 ng/mL (ref 0.00–5.00)

## 2020-09-21 LAB — FERRITIN: Ferritin: 7 ng/mL — ABNORMAL LOW (ref 11–307)

## 2020-09-21 NOTE — Progress Notes (Signed)
Briana Ochoa   Telephone:([phone]) [phone] Fax:(336) [phone]   Clinic Follow up Note   Patient Care Team: Nche, [name], NP as PCP - General (Internal Medicine) Fanny Skates, MD as Consulting Physician (General Surgery) Thea Silversmith, MD as Consulting Physician (Radiation Oncology) Nicholas Lose, MD as Consulting Physician (Hematology and Oncology) Garvin Fila, MD as Consulting Physician (Neurology) Consuella Lose, MD as Consulting Physician (Neurosurgery) Katy Apo, MD as Consulting Physician (Ophthalmology) Jonnie Finner, RN (Inactive) as Oncology Nurse Navigator Truitt Merle, MD as Consulting Physician (Hematology and Oncology) Leighton Ruff, MD as Consulting Physician (General Surgery)  Date of Service:  09/21/2020  CHIEF COMPLAINT: f/u of colon cancer, h/o left breast DCIS  SUMMARY OF ONCOLOGIC HISTORY: Oncology History Overview Note  Cancer Staging Cancer of left colon [facility]) Staging form: Colon and Rectum, AJCC 8th Edition - Pathologic stage from 05/28/2020: Stage IIA (pT3, pN0, cM0) - Signed by Truitt Merle, MD on 06/19/2020 Stage prefix: Initial diagnosis Total positive nodes: 0 Histologic grading system: 4 grade system Histologic grade (G): G3 Residual tumor (R): R0 - None  Ductal carcinoma in situ (DCIS) of left breast Staging form: Breast, AJCC 7th Edition - Clinical: Stage 0 (Tis (DCIS), N0, cM0) - Unsigned Specimen type: Core Needle Biopsy Histopathologic type: 9932 Laterality: Left Staging comments: Staged at breast conference 7.22.15  - Pathologic: Stage 0 (Tis (DCIS)(2), N0, cM0) - Unsigned Specimen type: Core Needle Biopsy Histopathologic type: 9932 Laterality: Left Tumor size (mm): 3 Multiple tumors: Yes Number of tumors: 2 Method of lymph node assessment: Clinical Method of detection of distant metastases: Clinical Residual tumor (R): R0 - None Estrogen receptor status: Positive Progesterone receptor status:  Positive    Ductal carcinoma in situ (DCIS) of left breast  08/20/2013 Initial Diagnosis   Left breast 12:00: DCIS with calcifications, yet 100%, PR 100%; 09/04/2013 second group of calcifications also biopsied proven to be DCIS ER/PR positive   09/19/2013 Surgery   Left breast lumpectomy DCIS 2 foci margins negative, 0.2 and 0.3 cm ER 100% PR 100%   10/08/2013 - 11/12/2013 Radiation Therapy   Adjuvant radiation therapy   11/12/2013 - 2020 Anti-estrogen oral therapy   Anastrozole 1 mg daily plan is for 5 years   01/09/2014 - 01/10/2014 Hospital Admission   Pipeline embolization 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 The report date is 07/30/2020.   The CancerNext-Expanded + RNAinsight gene panel offered by [employer] and includes sequencing and rearrangement analysis for the following 77 genes: AIP, ALK, APC, ATM, AXIN2, BAP1, BARD1, BLM, BMPR1A, BRCA1, BRCA2, BRIP1, CDC73, CDH1, CDK4, CDKN1B, CDKN2A, CHEK2, CTNNA1, DICER1, FANCC, FH, FLCN, GALNT12, KIF1B, LZTR1, MAX, MEN1, MET, MLH1, MSH2, MSH3, MSH6, MUTYH, NBN, NF1, NF2, NTHL1, PALB2, PHOX2B, PMS2, POT1, PRKAR1A, PTCH1, PTEN, RAD51C, RAD51D, RB1, RECQL, RET, SDHA, SDHAF2, SDHB, SDHC, SDHD, SMAD4, SMARCA4, SMARCB1, SMARCE1, STK11, SUFU, TMEM127, TP53, TSC1, TSC2, VHL and XRCC2 (sequencing and deletion/duplication); EGFR, EGLN1, HOXB13, KIT, MITF, PDGFRA, POLD1 and POLE (sequencing only); EPCAM and GREM1 (deletion/duplication only). RNA data is routinely analyzed for use in variant interpretation for all genes.   Bladder cancer ([place])  02/09/2018 Initial Diagnosis   Bladder cancer ([place])   07/30/2020 Genetic Testing   Negative genetic testing:  No pathogenic variants detected on the [employer] CancerNext-Expanded + RNAinsight panel. The report date is 07/30/2020.   The CancerNext-Expanded + RNAinsight gene panel offered by  [employer] and includes sequencing and rearrangement analysis for the following 77 genes: AIP, ALK, APC, ATM, AXIN2, BAP1, BARD1, BLM, BMPR1A, BRCA1, BRCA2, BRIP1, CDC73, CDH1, CDK4, CDKN1B, CDKN2A, CHEK2, CTNNA1, DICER1, FANCC, FH, FLCN, GALNT12, KIF1B, LZTR1, MAX, MEN1, MET, MLH1, MSH2, MSH3, MSH6, MUTYH, NBN, NF1, NF2, NTHL1, PALB2, PHOX2B, PMS2, POT1, PRKAR1A, PTCH1, PTEN, RAD51C, RAD51D, RB1, RECQL, RET, SDHA, SDHAF2, SDHB, SDHC, SDHD, SMAD4, SMARCA4, SMARCB1, SMARCE1, STK11, SUFU, TMEM127, TP53, TSC1, TSC2, VHL and XRCC2 (sequencing and deletion/duplication); EGFR, EGLN1, HOXB13, KIT, MITF, PDGFRA, POLD1 and POLE (sequencing only); EPCAM and GREM1 (deletion/duplication only). RNA data is routinely analyzed for use in variant interpretation for all genes.   08/31/2020 Imaging   CT CAP w/o contrast  IMPRESSION: 1. No specific findings of active malignancy on today's noncontrast CT. There has been interval partial colectomy involving the tumor site with reanastomosis. Some faint stranding in the adjacent omentum is probably from scarring or fat necrosis, less likely due to early tumor given the lack of overt nodularity. Surveillance imaging is likely indicated, and PET-CT may have a role in imaging follow up. 2. Several small pulmonary nodules, in addition to a larger mixed density nodule in the left upper lobe. Surveillance is recommended. 3. Other imaging findings of potential clinical significance: Aortic Atherosclerosis (ICD10-I70.0). Coronary atherosclerosis. Mitral valve calcification. Small type 1 hiatal hernia. Splenectomy with small amount of regenerative splenic tissue as well as a venous varix arising from the splenic vein. Hyperdense left kidney lower pole exophytic homogeneous lesion, probably a complex cyst. Sigmoid colon diverticulosis. Multilevel lumbar spondylosis and degenerative disc disease.   Cancer of left colon ([place])  04/23/2020 Procedure   Upper Endoscopy and  Colonoscopy by Dr Carlean Purl IMPRESSION Erythematous mucosa in the antrum. Biopsied. - Small hiatal hernia. - Gastroesophageal flap valve classified as Hill Grade IV (no fold, wide open lumen, hiatal hernia present). - The examination was otherwise normal.   IMPRESSION - Decreased sphincter tone found on digital rectal exam. - Malignant partially obstructing tumor in the proximal descending colon. Biopsied. Tattooed. - Three 5 to 20 mm polyps in the sigmoid colon, removed piecemeal using a cold snare. Resected and retrieved. - Severe diverticulosis in the sigmoid colon. - I left several diminutive descending (proximal to tattoo), sigmoid and rectal polyps - The examination was otherwise normal on direct and retroflexion views.     04/23/2020 Initial Diagnosis   Diagnosis 1. Descending Colon Polyp, mass - ADENOCARCINOMA. 2. Sigmoid Colon Polyp, (3) - MULTIPLE FRAGMENTS OF TUBULAR ADENOMA. - MULTIPLE FRAGMENTS OF SESSILE SERRATED POLYP WITHOUT DYSPLASIA. - NO HIGH GRADE DYSPLASIA OR MALIGNANCY. Microscopic Comment 1. Dr. Vic Ripper has reviewed the case. Dr. Carlean Purl was notified on 04/24/2020. 2. Despite being left sided the fragments have features of a sessile serrated polyp, as opposed to, hyperplastic polyp.   04/27/2020 Imaging   CT AP IMPRESSION: Bulky annular soft tissue mass involving the distal transverse colon with extension into adjacent pericolonic fat, consistent with primary colon carcinoma.   No evidence of metastatic disease.   Colonic diverticulosis. No radiographic evidence of diverticulitis.   Aortic Atherosclerosis (ICD10-I70.0).     05/05/2020 Imaging   CT Chest IMPRESSION: Bulky annular soft tissue mass involving the distal transverse colon with extension into adjacent pericolonic fat, consistent with primary colon carcinoma.   No evidence of metastatic disease.   Colonic diverticulosis. No radiographic evidence of diverticulitis.   Aortic  Atherosclerosis (ICD10-I70.0).     05/28/2020 Initial Diagnosis   Cancer of left colon ([place])  05/28/2020 Cancer Staging   Staging form: Colon and Rectum, AJCC 8th Edition - Pathologic stage from 05/28/2020: Stage IIA (pT3, pN0, cM0) - Signed by Truitt Merle, MD on 06/19/2020 Stage prefix: Initial diagnosis Total positive nodes: 0 Histologic grading system: 4 grade system Histologic grade (G): G3 Residual tumor (R): R0 - None   05/28/2020 Surgery   XI ROBOT ASSISTED RESECTION OF SPLENIC FLEXURE by Dr Marcello Moores    FINAL MICROSCOPIC DIAGNOSIS:   A. COLON, SPLENIC FLEXURE, RESECTION:  - Invasive adenocarcinoma, poorly differentiated, spanning 9.7 cm.  - Tumor invades through muscularis propria into pericolonic tissue.  - Resection margins are negative.  - Tubular adenoma (X1).  - Sessile serrate polyp without dysplasia (x2).  - Diverticulosis.  - Polypectomy scar.  - Twenty-four of twenty-four lymph nodes negative for carcinoma (0/24).  - See oncology table.    07/07/2020 Miscellaneous   Guardant Reveal ctDNA positive MSI high   07/30/2020 Genetic Testing   Negative genetic testing:  No pathogenic variants detected on the [employer] CancerNext-Expanded + RNAinsight panel. The report date is 07/30/2020.   The CancerNext-Expanded + RNAinsight gene panel offered by [employer] and includes sequencing and rearrangement analysis for the following 77 genes: AIP, ALK, APC, ATM, AXIN2, BAP1, BARD1, BLM, BMPR1A, BRCA1, BRCA2, BRIP1, CDC73, CDH1, CDK4, CDKN1B, CDKN2A, CHEK2, CTNNA1, DICER1, FANCC, FH, FLCN, GALNT12, KIF1B, LZTR1, MAX, MEN1, MET, MLH1, MSH2, MSH3, MSH6, MUTYH, NBN, NF1, NF2, NTHL1, PALB2, PHOX2B, PMS2, POT1, PRKAR1A, PTCH1, PTEN, RAD51C, RAD51D, RB1, RECQL, RET, SDHA, SDHAF2, SDHB, SDHC, SDHD, SMAD4, SMARCA4, SMARCB1, SMARCE1, STK11, SUFU, TMEM127, TP53, TSC1, TSC2, VHL and XRCC2 (sequencing and deletion/duplication); EGFR, EGLN1, HOXB13, KIT, MITF, PDGFRA, POLD1 and POLE (sequencing only);  EPCAM and GREM1 (deletion/duplication only). RNA data is routinely analyzed for use in variant interpretation for all genes.   08/31/2020 Imaging   CT CAP w/o contrast  IMPRESSION: 1. No specific findings of active malignancy on today's noncontrast CT. There has been interval partial colectomy involving the tumor site with reanastomosis. Some faint stranding in the adjacent omentum is probably from scarring or fat necrosis, less likely due to early tumor given the lack of overt nodularity. Surveillance imaging is likely indicated, and PET-CT may have a role in imaging follow up. 2. Several small pulmonary nodules, in addition to a larger mixed density nodule in the left upper lobe. Surveillance is recommended. 3. Other imaging findings of potential clinical significance: Aortic Atherosclerosis (ICD10-I70.0). Coronary atherosclerosis. Mitral valve calcification. Small type 1 hiatal hernia. Splenectomy with small amount of regenerative splenic tissue as well as a venous varix arising from the splenic vein. Hyperdense left kidney lower pole exophytic homogeneous lesion, probably a complex cyst. Sigmoid colon diverticulosis. Multilevel lumbar spondylosis and degenerative disc disease.   09/22/2020 -  Chemotherapy    Patient is on Treatment Plan: COLORECTAL PEMBROLIZUMAB Q21D          CURRENT THERAPY:  Xeloda and Keytruda, starting 09/21/20  INTERVAL HISTORY:  Briana Ochoa is here for a follow up of colon cancer. She was last seen by me on 08/21/20. She presents to the clinic with her daughter today.  She is clinically doing very well, denies any pain or any other discomfort.  She has good appetite and energy level, weight is stable.   All other systems were reviewed with the patient and are negative.  MEDICAL HISTORY:  Past Medical History:  Diagnosis Date   Allergy    seasonal allergies   Anemia    on meds  Aortic atherosclerosis (HCC)    Bladder cancer ([place]) 2020    Bladder tumor    Bloody diarrhea 09/29/2017   Breast cancer ([place]) 2015   Left Breast Cancer   Cataract    bilateral -sx   CKD (chronic kidney disease), stage III (HCC)    DM related   Colon cancer ([place])    Diabetes mellitus type 2, diet-controlled ([place])    diet controlled   Dyspnea 09/28/2017   Dysuria    Family history of adverse reaction to anesthesia    aunt- N/V    Family history of breast cancer    Family history of kidney cancer    Family history of throat cancer    Family history of uterine cancer    Fatigue 09/28/2017   Frequency of urination    Grade I diastolic dysfunction 26/83/4196   Noted on ECHO   Gross hematuria 09/29/2017   Heart murmur    since rheumatic fever as child   Hematuria    History of cardiomegaly 02/12/2005   Mild, noted on CXR   History of CVA (cerebrovascular accident) 08/05/2014   post op cerebral angiogram, cerebral thrombosis w/ cerebral infarction;  11-02-2017  per pt no residuals   History of malignant melanoma of skin 12/2015   s/p wide local excision right lower leg (per pt localized)   History of rheumatic fever as a child    Hyperlipidemia    on meds   Hypertension    on meds   Intracranial aneurysm dx 07/ 2015   paraophthalmic RICA aneurysm /   s/p  Pipeline embolization right ICA 01-09-2014   Jaundice 09/28/2017   Malignant neoplasm of central portion of left breast in female, estrogen receptor positive [facility]) oncologist-  dr Lindi Adie   dx 07/ 2015--- DCIS, ER/PR positive-- s/p  left breast lumpectomy 09-19-2013,  completed radiation 11-12-2013,  started arimidex 11-12-2013   Melanoma ([place])    righ tleg    OA (osteoarthritis)    knees, hands, R shoulder   Osteopenia 10/16/2012   Diffuse   Personal history of radiation therapy    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   Sigmoid diverticulosis    Weakness 09/28/2017   Wears dentures    upper   Wears glasses    Wears hearing aid in both ears     SURGICAL  HISTORY: Past Surgical History:  Procedure Laterality Date   BREAST LUMPECTOMY Left 2015   BREAST LUMPECTOMY WITH NEEDLE LOCALIZATION Left 09/19/2013   Procedure: LEFT BREAST LUMPECTOMY WITH DOUBLE WIRE BRACKETED  NEEDLE LOCALIZATION;  Surgeon: [name], MD;  Location: [facility];  Service: General;  Laterality: Left;   CATARACT EXTRACTION W/ INTRAOCULAR LENS  IMPLANT, BILATERAL  06/2015   CESAREAN SECTION  1964   CHOLECYSTECTOMY OPEN  1970s   CYSTOSCOPY W/ URETERAL STENT PLACEMENT Left 11/07/2017   Procedure: CYSTOSCOPY WITH RETROGRADE PYELOGRAM/URETERAL STENT PLACEMENT;  Surgeon: Cleon Gustin, MD;  Location: [facility];  Service: Urology;  Laterality: Left;   CYSTOSCOPY W/ URETERAL STENT PLACEMENT Bilateral 04/27/2018   Procedure: CYSTOSCOPY WITH RETROGRADE PYELOGRAM;  Surgeon: Cleon Gustin, MD;  Location: [facility];  Service: Urology;  Laterality: Bilateral;   FINGER SURGERY     lt thumb  from dog bite   IR  NEPHROURETERAL CATH PLACE LEFT  04/28/2018   MELANOMA EXCISION Right 12/28/2015   Procedure: EXCISION MELANOMA RIGHT LOWER LEG;  Surgeon: Fanny Skates, MD;  Location: [facility];  Service: General;  Laterality: Right;   RADIOLOGY WITH ANESTHESIA N/A 01/09/2014   Procedure: Embolization;  Surgeon: Consuella Lose, MD;  Location: [place];  Service: Radiology;  Laterality: N/A;   SPLENECTOMY, TOTAL  2009 approx.   splen injury due to fall off horse   TONSILLECTOMY  child   TRANSURETHRAL RESECTION OF BLADDER TUMOR N/A 11/07/2017   Procedure: TRANSURETHRAL RESECTION OF BLADDER TUMOR (TURBT), POSSIBLE STENT PLACEMENT;  Surgeon: Cleon Gustin, MD;  Location: [facility];  Service: Urology;  Laterality: N/A;   TRANSURETHRAL RESECTION OF BLADDER TUMOR N/A 12/14/2017   Procedure: TRANSURETHRAL RESECTION OF BLADDER TUMOR (TURBT);  Surgeon: Cleon Gustin, MD;  Location: [facility];  Service:  Urology;  Laterality: N/A;  [place] TUMOR N/A 04/27/2018   Procedure: TRANSURETHRAL RESECTION OF BLADDER TUMOR (TURBT);  Surgeon: Cleon Gustin, MD;  Location[facility];  Service: Urology;  Laterality: N/A;  23 MINS    I have reviewed the social history and family history with the patient and they are unchanged from previous note.  ALLERGIES:  is allergic to feraheme [ferumoxytol].  MEDICATIONS:  Current Outpatient Medications  Medication Sig Dispense Refill   acetaminophen (TYLENOL) 500 MG tablet Take 1,000 mg by mouth daily.     apixaban (ELIQUIS) 5 MG TABS tablet Take 45m BID x7days, then 548mBID continuously, with food (Patient taking differently: Take 5-10 mg by mouth See admin instructions. Take 1064mwice daily x7days, then 5mg3mice daily continuously, with food) 180 tablet 0   bismuth subsalicylate (PEPTO BISMOL) 262 MG/15ML suspension Take 30 mLs by mouth every 6 (six) hours as needed for diarrhea or loose stools.     capecitabine (XELODA) 500 MG tablet Take 2 tablets (1000 mg total) by mouth in morning and 3 tablets (1500 mg total) by mouth in evening. Take within 30 minutes after meals. Take for 14 days on and 7 days off. Repeat every 21 days. 70 tablet 0   ferrous gluconate (FERGON) 324 MG tablet Take 1 tablet (324 mg total) by mouth 2 (two) times daily with a meal. 180 tablet 0   glucose blood (TRUE METRIX BLOOD GLUCOSE TEST) test strip Use as instructed to test blood sugar once daily E08.638 100 each 2   lisinopril (ZESTRIL) 20 MG tablet TAKE 1 TABLET EVERY DAY 90 tablet 3   pravastatin (PRAVACHOL) 10 MG tablet TAKE 1 TABLET EVERY DAY (STOP PRAVASTATIN 20MG) 90 tablet 3   traMADol (ULTRAM) 50 MG tablet Take 1 tablet (50 mg total) by mouth every 6 (six) hours as needed for moderate pain. 25 tablet 0   No current facility-administered medications for this visit.    PHYSICAL EXAMINATION: ECOG PERFORMANCE STATUS: 0 -  Asymptomatic  Vitals:   09/21/20 1523  BP: (!) 151/59  Pulse: 63  Resp: 18  Temp: 97.8 F (36.6 C)  SpO2: 100%   Wt Readings from Last 3 Encounters:  09/21/20 170 lb 1.6 oz (77.2 kg)  08/21/20 166 lb 4.8 oz (75.4 kg)  06/19/20 167 lb 6.4 oz (75.9 kg)     GENERAL:alert, no distress and comfortable SKIN: skin color, texture, turgor are normal, no rashes or significant lesions EYES: normal, Conjunctiva are pink and non-injected, sclera clear Musculoskeletal:no cyanosis of digits and no clubbing  NEURO: alert & oriented x 3 with fluent speech, no focal motor/sensory deficits  LABORATORY DATA:  I have reviewed the data as listed  CBC Latest Ref Rng & Units 09/21/2020 08/21/2020 07/07/2020  WBC 4.0 - 10.5 K/uL 9.3 10.9(H) 10.3  Hemoglobin 12.0 - 15.0 g/dL 13.4 13.0 11.7(L)  Hematocrit 36.0 - 46.0 % 41.2 39.5 37.5  Platelets 150 - 400 K/uL 337 367 410(H)     CMP Latest Ref Rng & Units 09/21/2020 08/21/2020 07/07/2020  Glucose 70 - 99 mg/dL 107(H) 101(H) 94  BUN 8 - 23 mg/dL 24(H) 29(H) 21  Creatinine 0.44 - 1.00 mg/dL 1.47(H) 1.36(H) 1.21(H)  Sodium 135 - 145 mmol/L 142 141 140  Potassium 3.5 - 5.1 mmol/L 5.1 5.2(H) 4.5  Chloride 98 - 111 mmol/L 108 107 107  CO2 22 - 32 mmol/L _0 Calcium 8.9 - 10.3 mg/dL 9.6 9.8 9.5  Total Protein 6.5 - 8.1 g/dL 6.9 7.0 6.6  Total Bilirubin 0.3 - 1.2 mg/dL 0.3 <0.2(L) 0.2(L)  Alkaline Phos 38 - 126 U/L 85 92 73  AST 15 - 41 U/L _1 ALT 0 - 44 U/L _2 RADIOGRAPHIC STUDIES: I have personally reviewed the radiological images as listed and agreed with the findings in the report. No results found.   ASSESSMENT & PLAN:  Briana Ochoa is a 80 y.o. female with   1. Colon cancer in Splenic Flexure, pT3N0M0, Stage II, MSI-High -After months of worsened anemia, work up in ED showed GI blood loss. 04/23/20 Colonoscopy showed her to have adenocarcinoma of descending colon. -She underwent surgical resection with Dr  Marcello Moores on 05/28/20. Surgical pathology showed 9.7cm mass in splenic flexure with invasive adenocarcinoma, poorly differentiated was completely removed, negative margins, negative LNs, Stage II. She has intermediate risk of recurrence.  -Guardant reveal performed 07/07/20 was positive, which predicts a very high risk of recurrence in the near future. The recommendation was to proceed with Xeloda (oral) and Keytruda (IV), with staging scan prior to treatment start. -PET scan was recommended but denied by her insurance. She proceeded with CT CAP w/o contrast on 08/31/20 showing no specific findings of active malignancy. I personally reviewed the scan images and discussed today findings with patient and her daughter. -I again reviewed the side effect of Xeloda and Keytruda, she agrees to proceed.  Plan to give Xeloda 3 to 6 months depending on her tolerance, and Keytruda 1-2 years if repeat scan showed no evidence of recurrence  -Labs reviewed, CBC WNL, CMP shows Cr at 1.47. Overall adequate to proceed with treatment. Xeloda dose reduce due to CKD -She would like to start treatment next week, will schedule   2. Anemia of iron deficiency  -Secondary to #1 with GI blood loss -She required Blood transfusion and IV Iron. She did have reaction at start of infusion. -resolved following surgery, CBC WNL today (09/21/20)   3. H/o of Left breast DCIS, H/o Bladder cancer, H/o Melanoma in 2017 of right lower leg -Diagnosed with DCIS in 2015, S/p left lumpectomy, Adjuvant Radiation and 5 years of Anastrozole. Treated by Dr Lindi Adie. -Diagnosed with bladder cancer in 02/2018, early stage, S/p surgery  -She will continue surveillance.   4. Genetic Testing -Given her personably history of cancer and family history of cancer, I recommend genetic testing.  -performed 07/07/20, results negative   5. Comorbidites: CKD, DM, Heart murmur, H/o mini stroke from Brain aneurysm, HTN, Osteopenia -Continue medications and to f/u  with other physicians for management.      PLAN:  -start Keytruda and Xeloda next week, at 1000 mg in  the morning, 1500 mg in the evening, on day 1-14, every 21 days. -chemo class this week -f/u in 2 weeks for toxicity checkup   No problem-specific Assessment & Plan notes found for this encounter.   No orders of the defined types were placed in this encounter.  All questions were answered. The patient knows to call the clinic with any problems, questions or concerns. No barriers to learning was detected. The total time spent in the appointment was 30 minutes.     Truitt Merle, MD 09/21/2020   I, Wilburn Mylar, am acting as scribe for Truitt Merle, MD.   I have reviewed the above documentation for accuracy and completeness, and I agree with the above.

## 2020-09-21 NOTE — Telephone Encounter (Signed)
Oral Chemotherapy Pharmacist Encounter  I met with patient and patient's daughter in clinic for overview of: Xeloda (capecitabine) for the adjuvant treatment of stage II colon cancer in conjunction with pembrolizumab, planned duration of of Xeloda likely 6 months  Counseled patient on administration, dosing, side effects, monitoring, drug-food interactions, safe handling, storage, and disposal.  Patient will take Xeloda 554m tablets, 2 tablets (10037m by mouth in AM and 3 tabs (150026mby mouth in PM, within 30 minutes of finishing meals, on days 1-14 of each 21 day cycle.   Xeloda and pembrolizumab start date: 09/22/20  Adverse effects include but are not limited to: fatigue, decreased blood counts, GI upset, diarrhea, mouth sores, and hand-foot syndrome.  Patient will obtain anti diarrheal and alert the office of 4 or more loose stools above baseline.  Reviewed with patient importance of keeping a medication schedule and plan for any missed doses. No barriers to medication adherence identified.  Medication reconciliation performed and medication/allergy list updated.  Insurance authorization for Xeloda has been obtained. Patient will pick this up from the [facility] 09/21/20.   Patient informed the pharmacy will reach out 5-7 days prior to needing next fill of Xeloda to coordinate continued medication acquisition to prevent break in therapy.  All questions answered.  Ms. McaGeorganna Skeansiced understanding and appreciation.   Medication education handout and calendar given to patient. Patient knows to call the office with questions or concerns. Oral Chemotherapy Clinic phone number provided to patient.   RebLeron CroakharmD, BCPS Hematology/Oncology Clinical Pharmacist [phone]/2022 3:41 PM

## 2020-09-22 ENCOUNTER — Telehealth: Payer: Self-pay | Admitting: Hematology

## 2020-09-22 ENCOUNTER — Ambulatory Visit: Payer: Medicare PPO

## 2020-09-22 NOTE — Telephone Encounter (Signed)
Left message with follow-up appointments per 8/15 los.

## 2020-09-25 ENCOUNTER — Encounter: Payer: Self-pay | Admitting: Hematology

## 2020-09-25 ENCOUNTER — Other Ambulatory Visit: Payer: Self-pay

## 2020-09-25 ENCOUNTER — Inpatient Hospital Stay: Payer: Medicare PPO

## 2020-09-25 NOTE — Progress Notes (Signed)
Met with patient at registration to introduce myself as Financial Resource Specialist and to offer available resources.  Discussed one-time $1000 [employer] grant and qualifications to assist with personal expenses while going through treatment.  Gave her my card if interested in applying and for any additional financial questions or concerns. 

## 2020-09-30 ENCOUNTER — Other Ambulatory Visit (HOSPITAL_COMMUNITY): Payer: Self-pay

## 2020-10-02 ENCOUNTER — Other Ambulatory Visit: Payer: Self-pay

## 2020-10-02 ENCOUNTER — Inpatient Hospital Stay: Payer: Medicare PPO

## 2020-10-02 ENCOUNTER — Other Ambulatory Visit: Payer: Self-pay | Admitting: Hematology

## 2020-10-02 VITALS — BP 147/76 | HR 70 | Temp 98.0°F | Resp 16

## 2020-10-02 DIAGNOSIS — C679 Malignant neoplasm of bladder, unspecified: Secondary | ICD-10-CM | POA: Diagnosis not present

## 2020-10-02 DIAGNOSIS — C186 Malignant neoplasm of descending colon: Secondary | ICD-10-CM

## 2020-10-02 DIAGNOSIS — I700 Atherosclerosis of aorta: Secondary | ICD-10-CM | POA: Diagnosis not present

## 2020-10-02 DIAGNOSIS — M5136 Other intervertebral disc degeneration, lumbar region: Secondary | ICD-10-CM | POA: Diagnosis not present

## 2020-10-02 DIAGNOSIS — Z5112 Encounter for antineoplastic immunotherapy: Secondary | ICD-10-CM | POA: Diagnosis not present

## 2020-10-02 DIAGNOSIS — K922 Gastrointestinal hemorrhage, unspecified: Secondary | ICD-10-CM | POA: Diagnosis not present

## 2020-10-02 DIAGNOSIS — D500 Iron deficiency anemia secondary to blood loss (chronic): Secondary | ICD-10-CM | POA: Diagnosis not present

## 2020-10-02 DIAGNOSIS — M47816 Spondylosis without myelopathy or radiculopathy, lumbar region: Secondary | ICD-10-CM | POA: Diagnosis not present

## 2020-10-02 DIAGNOSIS — D0512 Intraductal carcinoma in situ of left breast: Secondary | ICD-10-CM | POA: Diagnosis not present

## 2020-10-02 LAB — TSH: TSH: 1.579 u[IU]/mL (ref 0.308–3.960)

## 2020-10-02 MED ORDER — ONDANSETRON HCL 8 MG PO TABS: 8.0000 mg | ORAL_TABLET | Freq: Three times a day (TID) | ORAL | 0 refills | Status: DC | PRN

## 2020-10-02 MED ORDER — SODIUM CHLORIDE 0.9 % IV SOLN
200.0000 mg | Freq: Once | INTRAVENOUS | Status: AC
  Administered 2020-10-02: 200 mg via INTRAVENOUS
  Filled 2020-10-02: qty 8

## 2020-10-02 MED ORDER — SODIUM CHLORIDE 0.9 % IV SOLN: Freq: Once | INTRAVENOUS | Status: AC

## 2020-10-02 NOTE — Progress Notes (Signed)
Per Dr. Burr Medico, ok to use labs from 09/21/2020.

## 2020-10-02 NOTE — Patient Instructions (Signed)
[facility]   Discharge Instructions: Thank you for choosing [facility] to provide your oncology and hematology care.   If you have a lab appointment with the [facility], please go directly to the [facility] and check in at the registration area.   Wear comfortable clothing and clothing appropriate for easy access to any Portacath or PICC line.   We strive to give you quality time with your provider. You may need to reschedule your appointment if you arrive late (15 or more minutes).  Arriving late affects you and other patients whose appointments are after yours.  Also, if you miss three or more appointments without notifying the office, you may be dismissed from the clinic at the provider's discretion.      For prescription refill requests, have your pharmacy contact our office and allow 72 hours for refills to be completed.    Today you received the following chemotherapy and/or immunotherapy agents: pembrolizumab.      To help prevent nausea and vomiting after your treatment, we encourage you to take your nausea medication as directed.  BELOW ARE SYMPTOMS THAT SHOULD BE REPORTED IMMEDIATELY: *FEVER GREATER THAN 100.4 F (38 C) OR HIGHER *CHILLS OR SWEATING *NAUSEA AND VOMITING THAT IS NOT CONTROLLED WITH YOUR NAUSEA MEDICATION *UNUSUAL SHORTNESS OF BREATH *UNUSUAL BRUISING OR BLEEDING *URINARY PROBLEMS (pain or burning when urinating, or frequent urination) *BOWEL PROBLEMS (unusual diarrhea, constipation, pain near the anus) TENDERNESS IN MOUTH AND THROAT WITH OR WITHOUT PRESENCE OF ULCERS (sore throat, sores in mouth, or a toothache) UNUSUAL RASH, SWELLING OR PAIN  UNUSUAL VAGINAL DISCHARGE OR ITCHING   Items with * indicate a potential emergency and should be followed up as soon as possible or go to the Emergency Department if any problems should occur.  Please show the CHEMOTHERAPY ALERT CARD or IMMUNOTHERAPY ALERT CARD at  check-in to the Emergency Department and triage nurse.  Should you have questions after your visit or need to cancel or reschedule your appointment, please contact [facility]  Dept: [phone]  and follow the prompts.  Office hours are 8:00 a.m. to 4:30 p.m. Monday - Friday. Please note that voicemails left after 4:00 p.m. may not be returned until the following business day.  We are closed weekends and major holidays. You have access to a nurse at all times for urgent questions. Please call the main number to the clinic Dept: [phone] and follow the prompts.   For any non-urgent questions, you may also contact your provider using MyChart. We now offer e-Visits for anyone 27 and older to request care online for non-urgent symptoms. For details visit [email or web address].   Also download the MyChart app! Go to the app store, search "MyChart", open the app, select , and log in with your MyChart username and password.  Due to Covid, a mask is required upon entering the hospital/clinic. If you do not have a mask, one will be given to you upon arrival. For doctor visits, patients may have 1 support person aged 61 or older with them. For treatment visits, patients cannot have anyone with them due to current Covid guidelines and our immunocompromised population.   Pembrolizumab injection What is this medication? PEMBROLIZUMAB (pem broe liz ue mab) is a monoclonal antibody. It is used totreat certain types of cancer. This medicine may be used for other purposes; ask your health care provider orpharmacist if you have questions. COMMON BRAND NAME(S): Keytruda What should  I tell my care team before I take this medication? They need to know if you have any of these conditions: autoimmune diseases like Crohn's disease, ulcerative colitis, or lupus have had or planning to have an allogeneic stem cell transplant (uses someone else's stem cells) history of organ  transplant history of chest radiation nervous system problems like myasthenia gravis or Guillain-Barre syndrome an unusual or allergic reaction to pembrolizumab, other medicines, foods, dyes, or preservatives pregnant or trying to get pregnant breast-feeding How should I use this medication? This medicine is for infusion into a vein. It is given by a health careprofessional in a hospital or clinic setting. A special MedGuide will be given to you before each treatment. Be sure to readthis information carefully each time. Talk to your pediatrician regarding the use of this medicine in children. While this drug may be prescribed for children as young as 6 months for selectedconditions, precautions do apply. Overdosage: If you think you have taken too much of this medicine contact apoison control center or emergency room at once. NOTE: This medicine is only for you. Do not share this medicine with others. What if I miss a dose? It is important not to miss your dose. Call your doctor or health careprofessional if you are unable to keep an appointment. What may interact with this medication? Interactions have not been studied. This list may not describe all possible interactions. Give your health care provider a list of all the medicines, herbs, non-prescription drugs, or dietary supplements you use. Also tell them if you smoke, drink alcohol, or use illegaldrugs. Some items may interact with your medicine. What should I watch for while using this medication? Your condition will be monitored carefully while you are receiving thismedicine. You may need blood work done while you are taking this medicine. Do not become pregnant while taking this medicine or for 4 months after stopping it. Women should inform their doctor if they wish to become pregnant or think they might be pregnant. There is a potential for serious side effects to an unborn child. Talk to your health care professional or pharmacist for  more information. Do not breast-feed an infant while taking this medicine orfor 4 months after the last dose. What side effects may I notice from receiving this medication? Side effects that you should report to your doctor or health care professionalas soon as possible: allergic reactions like skin rash, itching or hives, swelling of the face, lips, or tongue bloody or black, tarry breathing problems changes in vision chest pain chills confusion constipation cough diarrhea dizziness or feeling faint or lightheaded fast or irregular heartbeat fever flushing joint pain low blood counts - this medicine may decrease the number of white blood cells, red blood cells and platelets. You may be at increased risk for infections and bleeding. muscle pain muscle weakness pain, tingling, numbness in the hands or feet persistent headache redness, blistering, peeling or loosening of the skin, including inside the mouth signs and symptoms of high blood sugar such as dizziness; dry mouth; dry skin; fruity breath; nausea; stomach pain; increased hunger or thirst; increased urination signs and symptoms of kidney injury like trouble passing urine or change in the amount of urine signs and symptoms of liver injury like dark urine, light-colored stools, loss of appetite, nausea, right upper belly pain, yellowing of the eyes or skin sweating swollen lymph nodes weight loss Side effects that usually do not require medical attention (report to yourdoctor or health care professional if they  continue or are bothersome): decreased appetite hair loss tiredness This list may not describe all possible side effects. Call your doctor for medical advice about side effects. You may report side effects to FDA at1-800-FDA-1088. Where should I keep my medication? This drug is given in a hospital or clinic and will not be stored at home. NOTE: This sheet is a summary. It may not cover all possible information. If you  have questions about this medicine, talk to your doctor, pharmacist, orhealth care provider.  2022 [employer]/Gold Standard (2018-12-26 21:44:53)

## 2020-10-09 ENCOUNTER — Inpatient Hospital Stay: Payer: Medicare PPO | Attending: Hematology | Admitting: Hematology

## 2020-10-09 ENCOUNTER — Encounter: Payer: Self-pay | Admitting: Hematology

## 2020-10-09 ENCOUNTER — Other Ambulatory Visit: Payer: Self-pay

## 2020-10-09 ENCOUNTER — Inpatient Hospital Stay: Payer: Medicare PPO

## 2020-10-09 VITALS — BP 145/71 | HR 63 | Temp 97.9°F | Resp 17 | Wt 171.4 lb

## 2020-10-09 DIAGNOSIS — C186 Malignant neoplasm of descending colon: Secondary | ICD-10-CM

## 2020-10-09 DIAGNOSIS — Z5112 Encounter for antineoplastic immunotherapy: Secondary | ICD-10-CM | POA: Insufficient documentation

## 2020-10-09 LAB — CMP (CANCER CENTER ONLY)
ALT: 11 U/L (ref 0–44)
AST: 14 U/L — ABNORMAL LOW (ref 15–41)
Albumin: 3.7 g/dL (ref 3.5–5.0)
Alkaline Phosphatase: 85 U/L (ref 38–126)
Anion gap: 10 (ref 5–15)
BUN: 27 mg/dL — ABNORMAL HIGH (ref 8–23)
CO2: 24 mmol/L (ref 22–32)
Calcium: 9.6 mg/dL (ref 8.9–10.3)
Chloride: 107 mmol/L (ref 98–111)
Creatinine: 1.37 mg/dL — ABNORMAL HIGH (ref 0.44–1.00)
GFR, Estimated: 39 mL/min — ABNORMAL LOW (ref >60)
Glucose, Bld: 91 mg/dL (ref 70–99)
Potassium: 4.6 mmol/L (ref 3.5–5.1)
Sodium: 141 mmol/L (ref 135–145)
Total Bilirubin: 0.3 mg/dL (ref 0.3–1.2)
Total Protein: 6.8 g/dL (ref 6.5–8.1)

## 2020-10-09 LAB — CBC WITH DIFFERENTIAL (CANCER CENTER ONLY)
Abs Immature Granulocytes: 0.01 10*3/uL (ref 0.00–0.07)
Basophils Absolute: 0.1 10*3/uL (ref 0.0–0.1)
Basophils Relative: 1 %
Eosinophils Absolute: 0.4 10*3/uL (ref 0.0–0.5)
Eosinophils Relative: 5 %
HCT: 41.3 % (ref 36.0–46.0)
Hemoglobin: 13.4 g/dL (ref 12.0–15.0)
Immature Granulocytes: 0 %
Lymphocytes Relative: 26 %
Lymphs Abs: 2.2 10*3/uL (ref 0.7–4.0)
MCH: 29.5 pg (ref 26.0–34.0)
MCHC: 32.4 g/dL (ref 30.0–36.0)
MCV: 90.8 fL (ref 80.0–100.0)
Monocytes Absolute: 0.9 10*3/uL (ref 0.1–1.0)
Monocytes Relative: 11 %
Neutro Abs: 5 10*3/uL (ref 1.7–7.7)
Neutrophils Relative %: 57 %
Platelet Count: 338 10*3/uL (ref 150–400)
RBC: 4.55 MIL/uL (ref 3.87–5.11)
RDW: 14.8 % (ref 11.5–15.5)
WBC Count: 8.6 10*3/uL (ref 4.0–10.5)
nRBC: 0.2 % (ref 0.0–0.2)

## 2020-10-09 LAB — FERRITIN: Ferritin: 50 ng/mL (ref 11–307)

## 2020-10-09 LAB — IRON AND TIBC
Iron: 92 ug/dL (ref 41–142)
Saturation Ratios: 26 % (ref 21–57)
TIBC: 358 ug/dL (ref 236–444)
UIBC: 266 ug/dL (ref 120–384)

## 2020-10-09 NOTE — Progress Notes (Signed)
[place]   [phone] [phone]   Clinic Follow up Note   Patient Care Team: Nche, [name], NP as PCP - General (Internal Medicine) Fanny Skates, MD as Consulting Physician (General Surgery) Thea Silversmith, MD as Consulting Physician (Radiation Oncology) Nicholas Lose, MD as Consulting Physician (Hematology and Oncology) Garvin Fila, MD as Consulting Physician (Neurology) Consuella Lose, MD as Consulting Physician (Neurosurgery) Katy Apo, MD as Consulting Physician (Ophthalmology) Jonnie Finner, RN (Inactive) as Oncology Nurse Navigator Truitt Merle, MD as Consulting Physician (Hematology and Oncology) Leighton Ruff, MD as Consulting Physician (General Surgery)  Date of Service:  10/09/2020  CHIEF COMPLAINT: f/u of colon cancer, h/o left breast DCIS  CURRENT THERAPY:  Xeloda (1018m in AM and 15070min PM, 2 weeks on/1 week off) and Keytruda q3weeks, starting 09/21/20  ASSESSMENT & PLAN:  Briana Ochoa a [age].o. female with   1. Colon cancer in Splenic Flexure, pT3N0M0, Stage II, MSI-High -she was diagnosed on  04/23/20 by Colonoscopy  -She underwent surgical resection with Dr ThMarcello Mooresn 05/28/20. Surgical pathology showed 9.7cm mass in splenic flexure with invasive adenocarcinoma, poorly differentiated was completely removed, negative margins, negative LNs, Stage II. She has intermediate risk of recurrence.  -Guardant reveal on 07/07/20 was positive, which likely indicating microscopic disease and high risk of recurrence. -PET scan was recommended but denied by her insurance. She proceeded with CT CAP w/o contrast on 08/31/20 showing no specific findings of active malignancy.  -She began Xeloda and Keytruda on 09/21/20. She takes Xeloda 100040mn AM and 1500m40m PM, 14 days on/7 days off, and she receives [place] weeks. Plan to give Xeloda 3 to 6 months depending on her tolerance, and Keytruda 1-2 years  if repeat scan showed no evidence of recurrence  -She is tolerating treatment well so far. Labs reviewed, no concern. Overall adequate to continue treatment.  -labs, f/u, and Keytruda in 2 weeks for cycle 2 -Patient had many questions about precautions for infections, I answered all to her satisfaction.   2. Anemia of iron deficiency  -Secondary to #1 with GI blood loss -She required Blood transfusion and IV Iron. -resolved following surgery 05/2020   3. H/o of Left breast DCIS, H/o Bladder cancer, H/o Melanoma in 2017 of right lower leg -Diagnosed with DCIS in 2015, S/p left lumpectomy, Adjuvant Radiation and 5 years of Anastrozole. Treated by Dr GudeLindi Adieiagnosed with bladder cancer in 02/2018, early stage, S/p surgery. Followed by Dr. McKeAlyson Inglesxt f/u on 10/28/20. -She will continue surveillance.   4. Genetic Testing -Given her personably history of cancer and family history of cancer, I recommend genetic testing.  -performed 07/07/20, results negative   5. Comorbidites: CKD, DM, Heart murmur, H/o mini stroke from Brain aneurysm, HTN, Osteopenia -Continue medications and to f/u with other physicians for management.      PLAN:  -continue Xeloda (1000 mg in AM, 1500 mg in PM, on day 1-14, every 21 days), started 10/02/20 -labs, f/u, and Keytruda in 2 weeks   No problem-specific Assessment & Plan notes found for this encounter.   SUMMARY OF ONCOLOGIC HISTORY: Oncology History Overview Note  Cancer Staging Cancer of [facility] ([facility] form: Colon and Rectum, AJCC 8th Edition - Pathologic stage from 05/28/2020: Stage IIA (pT3, pN0, cM0) - Signed by FengTruitt Merle on 06/19/2020 Stage prefix: Initial diagnosis Total positive nodes: 0 Histologic grading system: 4 grade system Histologic grade (G): G3 Residual tumor (R):  R0 - None  Ductal carcinoma in situ (DCIS) of left breast Staging form: Breast, AJCC 7th Edition - Clinical: Stage 0 (Tis (DCIS), N0, cM0) -  Unsigned Specimen type: Core Needle Biopsy Histopathologic type: 9932 Laterality: Left Staging comments: Staged at breast conference 7.22.15  - Pathologic: Stage 0 (Tis (DCIS)(2), N0, cM0) - Unsigned Specimen type: Core Needle Biopsy Histopathologic type: 9932 Laterality: Left Tumor size (mm): 3 Multiple tumors: Yes Number of tumors: 2 Method of lymph node assessment: Clinical Method of detection of distant metastases: Clinical Residual tumor (R): R0 - None Estrogen receptor status: Positive Progesterone receptor status: Positive    Ductal carcinoma in situ (DCIS) of left breast  08/20/2013 Initial Diagnosis   Left breast 12:00: DCIS with calcifications, yet 100%, PR 100%; 09/04/2013 second group of calcifications also biopsied proven to be DCIS ER/PR positive   09/19/2013 Surgery   Left breast lumpectomy DCIS 2 foci margins negative, 0.2 and 0.3 cm ER 100% PR 100%   10/08/2013 - 11/12/2013 Radiation Therapy   Adjuvant radiation therapy   11/12/2013 - 2020 Anti-estrogen oral therapy   Anastrozole 1 mg daily plan is for 5 years   01/09/2014 - 01/10/2014 Hospital Admission   Pipeline embolization 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 The report date is 07/30/2020.   The CancerNext-Expanded + RNAinsight gene panel offered by [employer] and includes sequencing and rearrangement analysis for the following 77 genes: AIP, ALK, APC, ATM, AXIN2, BAP1, BARD1, BLM, BMPR1A, BRCA1, BRCA2, BRIP1, CDC73, CDH1, CDK4, CDKN1B, CDKN2A, CHEK2, CTNNA1, DICER1, FANCC, FH, FLCN, GALNT12, KIF1B, LZTR1, MAX, MEN1, MET, MLH1, MSH2, MSH3, MSH6, MUTYH, NBN, NF1, NF2, NTHL1, PALB2, PHOX2B, PMS2, POT1, PRKAR1A, PTCH1, PTEN, RAD51C, RAD51D, RB1, RECQL, RET, SDHA, SDHAF2, SDHB, SDHC, SDHD, SMAD4, SMARCA4, SMARCB1, SMARCE1, STK11, SUFU, TMEM127, TP53, TSC1, TSC2, VHL and XRCC2  (sequencing and deletion/duplication); EGFR, EGLN1, HOXB13, KIT, MITF, PDGFRA, POLD1 and POLE (sequencing only); EPCAM and GREM1 (deletion/duplication only). RNA data is routinely analyzed for use in variant interpretation for all genes.   Bladder cancer ([place])  02/09/2018 Initial Diagnosis   Bladder cancer ([place])   07/30/2020 Genetic Testing   Negative genetic testing:  No pathogenic variants detected on the [employer] CancerNext-Expanded + RNAinsight panel. The report date is 07/30/2020.   The CancerNext-Expanded + RNAinsight gene panel offered by [employer] and includes sequencing and rearrangement analysis for the following 77 genes: AIP, ALK, APC, ATM, AXIN2, BAP1, BARD1, BLM, BMPR1A, BRCA1, BRCA2, BRIP1, CDC73, CDH1, CDK4, CDKN1B, CDKN2A, CHEK2, CTNNA1, DICER1, FANCC, FH, FLCN, GALNT12, KIF1B, LZTR1, MAX, MEN1, MET, MLH1, MSH2, MSH3, MSH6, MUTYH, NBN, NF1, NF2, NTHL1, PALB2, PHOX2B, PMS2, POT1, PRKAR1A, PTCH1, PTEN, RAD51C, RAD51D, RB1, RECQL, RET, SDHA, SDHAF2, SDHB, SDHC, SDHD, SMAD4, SMARCA4, SMARCB1, SMARCE1, STK11, SUFU, TMEM127, TP53, TSC1, TSC2, VHL and XRCC2 (sequencing and deletion/duplication); EGFR, EGLN1, HOXB13, KIT, MITF, PDGFRA, POLD1 and POLE (sequencing only); EPCAM and GREM1 (deletion/duplication only). RNA data is routinely analyzed for use in variant interpretation for all genes.   08/31/2020 Imaging   CT CAP w/o contrast  IMPRESSION: 1. No specific findings of active malignancy on today's noncontrast CT. There has been interval partial colectomy involving the tumor site with reanastomosis. Some faint stranding in the adjacent omentum is probably from scarring or fat necrosis, less likely due to early tumor given the lack of overt nodularity. Surveillance imaging is likely indicated, and PET-CT may have a  role in imaging follow up. 2. Several small pulmonary nodules, in addition to a larger mixed density nodule in the left upper lobe. Surveillance is recommended. 3. Other  imaging findings of potential clinical significance: Aortic Atherosclerosis (ICD10-I70.0). Coronary atherosclerosis. Mitral valve calcification. Small type 1 hiatal hernia. Splenectomy with small amount of regenerative splenic tissue as well as a venous varix arising from the splenic vein. Hyperdense left kidney lower pole exophytic homogeneous lesion, probably a complex cyst. Sigmoid colon diverticulosis. Multilevel lumbar spondylosis and degenerative disc disease.   Cancer of left colon ([place])  04/23/2020 Procedure   Upper Endoscopy and Colonoscopy by Dr Carlean Purl IMPRESSION Erythematous mucosa in the antrum. Biopsied. - Small hiatal hernia. - Gastroesophageal flap valve classified as Hill Grade IV (no fold, wide open lumen, hiatal hernia present). - The examination was otherwise normal.   IMPRESSION - Decreased sphincter tone found on digital rectal exam. - Malignant partially obstructing tumor in the proximal descending colon. Biopsied. Tattooed. - Three 5 to 20 mm polyps in the sigmoid colon, removed piecemeal using a cold snare. Resected and retrieved. - Severe diverticulosis in the sigmoid colon. - I left several diminutive descending (proximal to tattoo), sigmoid and rectal polyps - The examination was otherwise normal on direct and retroflexion views.     04/23/2020 Initial Diagnosis   Diagnosis 1. Descending Colon Polyp, mass - ADENOCARCINOMA. 2. Sigmoid Colon Polyp, (3) - MULTIPLE FRAGMENTS OF TUBULAR ADENOMA. - MULTIPLE FRAGMENTS OF SESSILE SERRATED POLYP WITHOUT DYSPLASIA. - NO HIGH GRADE DYSPLASIA OR MALIGNANCY. Microscopic Comment 1. Dr. Vic Ripper has reviewed the case. Dr. Carlean Purl was notified on 04/24/2020. 2. Despite being left sided the fragments have features of a sessile serrated polyp, as opposed to, hyperplastic polyp.   04/27/2020 Imaging   CT AP IMPRESSION: Bulky annular soft tissue mass involving the distal transverse colon with extension into adjacent  pericolonic fat, consistent with primary colon carcinoma.   No evidence of metastatic disease.   Colonic diverticulosis. No radiographic evidence of diverticulitis.   Aortic Atherosclerosis (ICD10-I70.0).     05/05/2020 Imaging   CT Chest IMPRESSION: Bulky annular soft tissue mass involving the distal transverse colon with extension into adjacent pericolonic fat, consistent with primary colon carcinoma.   No evidence of metastatic disease.   Colonic diverticulosis. No radiographic evidence of diverticulitis.   Aortic Atherosclerosis (ICD10-I70.0).     05/28/2020 Initial Diagnosis   Cancer of left colon ([place])   05/28/2020 Cancer Staging   Staging form: Colon and Rectum, AJCC 8th Edition - Pathologic stage from 05/28/2020: Stage IIA (pT3, pN0, cM0) - Signed by Truitt Merle, MD on 06/19/2020 Stage prefix: Initial diagnosis Total positive nodes: 0 Histologic grading system: 4 grade system Histologic grade (G): G3 Residual tumor (R): R0 - None   05/28/2020 Surgery   XI ROBOT ASSISTED RESECTION OF SPLENIC FLEXURE by Dr Marcello Moores    FINAL MICROSCOPIC DIAGNOSIS:   A. COLON, SPLENIC FLEXURE, RESECTION:  - Invasive adenocarcinoma, poorly differentiated, spanning 9.7 cm.  - Tumor invades through muscularis propria into pericolonic tissue.  - Resection margins are negative.  - Tubular adenoma (X1).  - Sessile serrate polyp without dysplasia (x2).  - Diverticulosis.  - Polypectomy scar.  - Twenty-four of twenty-four lymph nodes negative for carcinoma (0/24).  - See oncology table.    07/07/2020 Miscellaneous   Guardant Reveal ctDNA positive MSI high   07/30/2020 Genetic Testing   Negative genetic testing:  No pathogenic variants detected on the [employer] CancerNext-Expanded + RNAinsight panel. The report date is  07/30/2020.   The CancerNext-Expanded + RNAinsight gene panel offered by [employer] and includes sequencing and rearrangement analysis for the following 77 genes: AIP, ALK,  APC, ATM, AXIN2, BAP1, BARD1, BLM, BMPR1A, BRCA1, BRCA2, BRIP1, CDC73, CDH1, CDK4, CDKN1B, CDKN2A, CHEK2, CTNNA1, DICER1, FANCC, FH, FLCN, GALNT12, KIF1B, LZTR1, MAX, MEN1, MET, MLH1, MSH2, MSH3, MSH6, MUTYH, NBN, NF1, NF2, NTHL1, PALB2, PHOX2B, PMS2, POT1, PRKAR1A, PTCH1, PTEN, RAD51C, RAD51D, RB1, RECQL, RET, SDHA, SDHAF2, SDHB, SDHC, SDHD, SMAD4, SMARCA4, SMARCB1, SMARCE1, STK11, SUFU, TMEM127, TP53, TSC1, TSC2, VHL and XRCC2 (sequencing and deletion/duplication); EGFR, EGLN1, HOXB13, KIT, MITF, PDGFRA, POLD1 and POLE (sequencing only); EPCAM and GREM1 (deletion/duplication only). RNA data is routinely analyzed for use in variant interpretation for all genes.   08/31/2020 Imaging   CT CAP w/o contrast  IMPRESSION: 1. No specific findings of active malignancy on today's noncontrast CT. There has been interval partial colectomy involving the tumor site with reanastomosis. Some faint stranding in the adjacent omentum is probably from scarring or fat necrosis, less likely due to early tumor given the lack of overt nodularity. Surveillance imaging is likely indicated, and PET-CT may have a role in imaging follow up. 2. Several small pulmonary nodules, in addition to a larger mixed density nodule in the left upper lobe. Surveillance is recommended. 3. Other imaging findings of potential clinical significance: Aortic Atherosclerosis (ICD10-I70.0). Coronary atherosclerosis. Mitral valve calcification. Small type 1 hiatal hernia. Splenectomy with small amount of regenerative splenic tissue as well as a venous varix arising from the splenic vein. Hyperdense left kidney lower pole exophytic homogeneous lesion, probably a complex cyst. Sigmoid colon diverticulosis. Multilevel lumbar spondylosis and degenerative disc disease.   10/02/2020 -  Chemotherapy    Patient is on Treatment Plan: COLORECTAL PEMBROLIZUMAB Q21D          INTERVAL HISTORY:  Marykathleen Russi Radloff is here for a follow up of  colon cancer. She was last seen by me on 09/21/20. She presents to the clinic alone. She reports she is on day 8 of Xeloda. She reports she is tolerating well with maybe some fatigue. She denies any issues from [place]. She asked about immunity. She notes she wants to participate in church activities and Wednsday night dinners, as well as have grandchildren visit.   All other systems were reviewed with the patient and are negative.  MEDICAL HISTORY:  Past Medical History:  Diagnosis Date   Allergy    seasonal allergies   Anemia    on meds   Aortic atherosclerosis (HCC)    Bladder cancer ([place]) 2020   Bladder tumor    Bloody diarrhea 09/29/2017   Breast cancer ([place]) 2015   Left Breast Cancer   Cataract    bilateral -sx   CKD (chronic kidney disease), stage III (HCC)    DM related   Colon cancer ([place])    Diabetes mellitus type 2, diet-controlled ([place])    diet controlled   Dyspnea 09/28/2017   Dysuria    Family history of adverse reaction to anesthesia    aunt- N/V    Family history of breast cancer    Family history of kidney cancer    Family history of throat cancer    Family history of uterine cancer    Fatigue 09/28/2017   Frequency of urination    Grade I diastolic dysfunction 33/35/4562   Noted on ECHO   Gross hematuria 09/29/2017   Heart murmur    since rheumatic fever as child   Hematuria  History of cardiomegaly 02/12/2005   Mild, noted on CXR   History of CVA (cerebrovascular accident) 08/05/2014   post op cerebral angiogram, cerebral thrombosis w/ cerebral infarction;  11-02-2017  per pt no residuals   History of malignant melanoma of skin 12/2015   s/p wide local excision right lower leg (per pt localized)   History of rheumatic fever as a child    Hyperlipidemia    on meds   Hypertension    on meds   Intracranial aneurysm dx 07/ 2015   paraophthalmic RICA aneurysm /   s/p  Pipeline embolization right ICA 01-09-2014   Jaundice 09/28/2017   Malignant  neoplasm of central portion of left breast in female, estrogen receptor positive [facility]) oncologist-  dr Lindi Adie   dx 07/ 2015--- DCIS, ER/PR positive-- s/p  left breast lumpectomy 09-19-2013,  completed radiation 11-12-2013,  started arimidex 11-12-2013   Melanoma (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   Sigmoid diverticulosis    Weakness 09/28/2017   Wears dentures    upper   Wears glasses    Wears hearing aid in both ears     SURGICAL HISTORY: Past Surgical History:  Procedure Laterality Date   BREAST LUMPECTOMY Left 2015   BREAST LUMPECTOMY WITH NEEDLE LOCALIZATION Left 09/19/2013   Procedure: LEFT BREAST LUMPECTOMY WITH DOUBLE WIRE BRACKETED  NEEDLE LOCALIZATION;  Surgeon: [name], MD;  Location: [facility];  Service: General;  Laterality: Left;   CATARACT EXTRACTION W/ INTRAOCULAR LENS  IMPLANT, BILATERAL  06/2015   CESAREAN SECTION  1964   CHOLECYSTECTOMY OPEN  1970s   CYSTOSCOPY W/ URETERAL STENT PLACEMENT Left 11/07/2017   Procedure: CYSTOSCOPY WITH RETROGRADE PYELOGRAM/URETERAL STENT PLACEMENT;  Surgeon: Cleon Gustin, MD;  Location[facility];  Service: Urology;  Laterality: Left;   CYSTOSCOPY W/ URETERAL STENT PLACEMENT Bilateral 04/27/2018   Procedure: CYSTOSCOPY WITH RETROGRADE PYELOGRAM;  Surgeon: Cleon Gustin, MD;  Location[facility];  Service: Urology;  Laterality: Bilateral;   FINGER SURGERY     lt thumb  from dog bite   IR  NEPHROURETERAL CATH PLACE LEFT  04/28/2018   MELANOMA EXCISION Right 12/28/2015   Procedure: EXCISION MELANOMA RIGHT LOWER LEG;  Surgeon: Fanny Skates, MD;  Location: [facility];  Service: General;  Laterality: Right;   RADIOLOGY WITH ANESTHESIA N/A 01/09/2014   Procedure: Embolization;  Surgeon: Consuella Lose, MD;   Location: [facility];  Service: Radiology;  Laterality: N/A;   SPLENECTOMY, TOTAL  2009 approx.   splen injury due to fall off horse   TONSILLECTOMY  child   TRANSURETHRAL RESECTION OF BLADDER TUMOR N/A 11/07/2017   Procedure: TRANSURETHRAL RESECTION OF BLADDER TUMOR (TURBT), POSSIBLE STENT PLACEMENT;  Surgeon: Cleon Gustin, MD;  Location: [facility];  Service: Urology;  Laterality: N/A;   TRANSURETHRAL RESECTION OF BLADDER TUMOR N/A 12/14/2017   Procedure: TRANSURETHRAL RESECTION OF BLADDER TUMOR (TURBT);  Surgeon: Cleon Gustin, MD;  Location[facility];  Service: Urology;  Laterality: N/A;  [place] TUMOR N/A 04/27/2018   Procedure: TRANSURETHRAL RESECTION OF BLADDER TUMOR (TURBT);  Surgeon: Cleon Gustin, MD;  Location: [facility];  Service: Urology;  Laterality: N/A;  30  MINS    I have reviewed the social history and family history with the patient and they are unchanged from previous note.  ALLERGIES:  is allergic to feraheme [ferumoxytol].  MEDICATIONS:  Current Outpatient Medications  Medication Sig Dispense Refill   acetaminophen (TYLENOL) 500 MG tablet Take 1,000 mg by mouth daily.     apixaban (ELIQUIS) 5 MG TABS tablet Take 88m BID x7days, then 525mBID continuously, with food (Patient taking differently: Take 5-10 mg by mouth See admin instructions. Take 1029mwice daily x7days, then 5mg75mice daily continuously, with food) 180 tablet 0   bismuth subsalicylate (PEPTO BISMOL) 262 MG/15ML suspension Take 30 mLs by mouth every 6 (six) hours as needed for diarrhea or loose stools.     capecitabine (XELODA) 500 MG tablet Take 2 tablets (1000 mg total) by mouth in morning and 3 tablets (1500 mg total) by mouth in evening. Take within 30 minutes after meals. Take for 14 days on and 7 days off. Repeat every 21 days. 70 tablet 0   ferrous gluconate (FERGON) 324 MG tablet Take 1 tablet (324 mg  total) by mouth 2 (two) times daily with a meal. 180 tablet 0   glucose blood (TRUE METRIX BLOOD GLUCOSE TEST) test strip Use as instructed to test blood sugar once daily E08.638 100 each 2   lisinopril (ZESTRIL) 20 MG tablet TAKE 1 TABLET EVERY DAY 90 tablet 3   ondansetron (ZOFRAN) 8 MG tablet Take 1 tablet (8 mg total) by mouth every 8 (eight) hours as needed for nausea or vomiting. 20 tablet 0   pravastatin (PRAVACHOL) 10 MG tablet TAKE 1 TABLET EVERY DAY (STOP PRAVASTATIN 20MG) 90 tablet 3   traMADol (ULTRAM) 50 MG tablet Take 1 tablet (50 mg total) by mouth every 6 (six) hours as needed for moderate pain. 25 tablet 0   No current facility-administered medications for this visit.    PHYSICAL EXAMINATION: ECOG PERFORMANCE STATUS: 0 - Asymptomatic  Vitals:   10/09/20 1524  BP: (!) 145/71  Pulse: 63  Resp: 17  Temp: 97.9 F (36.6 C)  SpO2: 95%   Wt Readings from Last 3 Encounters:  10/09/20 171 lb 6.4 oz (77.7 kg)  09/21/20 170 lb 1.6 oz (77.2 kg)  08/21/20 166 lb 4.8 oz (75.4 kg)     GENERAL:alert, no distress and comfortable SKIN: skin color normal, no rashes or significant lesions EYES: normal, Conjunctiva are pink and non-injected, sclera clear  NEURO: alert & oriented x 3 with fluent speech  LABORATORY DATA:  I have reviewed the data as listed CBC Latest Ref Rng & Units 10/09/2020 09/21/2020 08/21/2020  WBC 4.0 - 10.5 K/uL 8.6 9.3 10.9(H)  Hemoglobin 12.0 - 15.0 g/dL 13.4 13.4 13.0  Hematocrit 36.0 - 46.0 % 41.3 41.2 39.5  Platelets 150 - 400 K/uL 338 337 367     CMP Latest Ref Rng & Units 10/09/2020 09/21/2020 08/21/2020  Glucose 70 - 99 mg/dL 91 107(H) 101(H)  BUN 8 - 23 mg/dL 27(H) 24(H) 29(H)  Creatinine 0.44 - 1.00 mg/dL 1.37(H) 1.47(H) 1.36(H)  Sodium 135 - 145 mmol/L 141 142 141  Potassium 3.5 - 5.1 mmol/L 4.6 5.1 5.2(H)  Chloride 98 - 111 mmol/L 107 108 107  CO2 22 - 32 mmol/L _0 Calcium 8.9 - 10.3 mg/dL 9.6 9.6 9.8  Total Protein 6.5 - 8.1 g/dL 6.8  6.9 7.0  Total Bilirubin 0.3 - 1.2 mg/dL 0.3 0.3 <0.2(L)  Alkaline Phos 38 - 126 U/L 85  85 92  AST 15 - 41 U/L 14(L) 16 17  ALT 0 - 44 U/L _0 RADIOGRAPHIC STUDIES: I have personally reviewed the radiological images as listed and agreed with the findings in the report. No results found.    No orders of the defined types were placed in this encounter.  All questions were answered. The patient knows to call the clinic with any problems, questions or concerns. No barriers to learning was detected. The total time spent in the appointment was 30 minutes.     Truitt Merle, MD 10/09/2020   I, Wilburn Mylar, am acting as scribe for Truitt Merle, MD.   I have reviewed the above documentation for accuracy and completeness, and I agree with the above.

## 2020-10-16 ENCOUNTER — Other Ambulatory Visit (HOSPITAL_COMMUNITY): Payer: Self-pay

## 2020-10-16 ENCOUNTER — Other Ambulatory Visit: Payer: Self-pay | Admitting: Hematology

## 2020-10-16 DIAGNOSIS — C186 Malignant neoplasm of descending colon: Secondary | ICD-10-CM

## 2020-10-16 MED ORDER — CAPECITABINE 500 MG PO TABS
ORAL_TABLET | ORAL | 0 refills | Status: DC
  Filled 2020-10-16: qty 70, 21d supply, fill #0

## 2020-10-20 ENCOUNTER — Other Ambulatory Visit (HOSPITAL_COMMUNITY): Payer: Self-pay

## 2020-10-23 ENCOUNTER — Inpatient Hospital Stay: Payer: Medicare PPO

## 2020-10-23 ENCOUNTER — Inpatient Hospital Stay: Payer: Medicare PPO | Admitting: Hematology

## 2020-10-23 ENCOUNTER — Other Ambulatory Visit: Payer: Self-pay

## 2020-10-23 ENCOUNTER — Encounter: Payer: Self-pay | Admitting: Hematology

## 2020-10-23 VITALS — BP 134/84 | HR 69 | Temp 97.8°F | Resp 18 | Ht 64.0 in | Wt 173.7 lb

## 2020-10-23 DIAGNOSIS — C186 Malignant neoplasm of descending colon: Secondary | ICD-10-CM

## 2020-10-23 DIAGNOSIS — Z5112 Encounter for antineoplastic immunotherapy: Secondary | ICD-10-CM | POA: Diagnosis not present

## 2020-10-23 LAB — IRON AND TIBC
Iron: 42 ug/dL (ref 41–142)
Saturation Ratios: 12 % — ABNORMAL LOW (ref 21–57)
TIBC: 353 ug/dL (ref 236–444)
UIBC: 311 ug/dL (ref 120–384)

## 2020-10-23 LAB — CBC WITH DIFFERENTIAL (CANCER CENTER ONLY)
Abs Immature Granulocytes: 0.03 10*3/uL (ref 0.00–0.07)
Basophils Absolute: 0.1 10*3/uL (ref 0.0–0.1)
Basophils Relative: 1 %
Eosinophils Absolute: 0.3 10*3/uL (ref 0.0–0.5)
Eosinophils Relative: 4 %
HCT: 41 % (ref 36.0–46.0)
Hemoglobin: 13.6 g/dL (ref 12.0–15.0)
Immature Granulocytes: 0 %
Lymphocytes Relative: 18 %
Lymphs Abs: 1.7 10*3/uL (ref 0.7–4.0)
MCH: 30.4 pg (ref 26.0–34.0)
MCHC: 33.2 g/dL (ref 30.0–36.0)
MCV: 91.7 fL (ref 80.0–100.0)
Monocytes Absolute: 1.2 10*3/uL — ABNORMAL HIGH (ref 0.1–1.0)
Monocytes Relative: 13 %
Neutro Abs: 5.9 10*3/uL (ref 1.7–7.7)
Neutrophils Relative %: 64 %
Platelet Count: 299 10*3/uL (ref 150–400)
RBC: 4.47 MIL/uL (ref 3.87–5.11)
RDW: 17.5 % — ABNORMAL HIGH (ref 11.5–15.5)
WBC Count: 9.3 10*3/uL (ref 4.0–10.5)
nRBC: 0.2 % (ref 0.0–0.2)

## 2020-10-23 LAB — CMP (CANCER CENTER ONLY)
ALT: 10 U/L (ref 0–44)
AST: 17 U/L (ref 15–41)
Albumin: 3.7 g/dL (ref 3.5–5.0)
Alkaline Phosphatase: 102 U/L (ref 38–126)
Anion gap: 9 (ref 5–15)
BUN: 27 mg/dL — ABNORMAL HIGH (ref 8–23)
CO2: 23 mmol/L (ref 22–32)
Calcium: 9.5 mg/dL (ref 8.9–10.3)
Chloride: 108 mmol/L (ref 98–111)
Creatinine: 1.73 mg/dL — ABNORMAL HIGH (ref 0.44–1.00)
GFR, Estimated: 30 mL/min — ABNORMAL LOW (ref >60)
Glucose, Bld: 103 mg/dL — ABNORMAL HIGH (ref 70–99)
Potassium: 4.7 mmol/L (ref 3.5–5.1)
Sodium: 140 mmol/L (ref 135–145)
Total Bilirubin: 0.3 mg/dL (ref 0.3–1.2)
Total Protein: 6.9 g/dL (ref 6.5–8.1)

## 2020-10-23 LAB — FERRITIN: Ferritin: 41 ng/mL (ref 11–307)

## 2020-10-23 MED ORDER — SODIUM CHLORIDE 0.9 % IV SOLN
200.0000 mg | Freq: Once | INTRAVENOUS | Status: AC
  Administered 2020-10-23: 200 mg via INTRAVENOUS
  Filled 2020-10-23: qty 8

## 2020-10-23 MED ORDER — SODIUM CHLORIDE 0.9 % IV SOLN: Freq: Once | INTRAVENOUS | Status: AC

## 2020-10-23 NOTE — Progress Notes (Addendum)
B[facility]ana [facility]   [phone] [phone]   Clinic Follow up Note   Patient Care Team: Nche, [name], NP as PCP - General (Internal Medicine) Fanny Skates, MD as Consulting Physician (General Surgery) Thea Silversmith, MD as Consulting Physician (Radiation Oncology) Nicholas Lose, MD as Consulting Physician (Hematology and Oncology) Garvin Fila, MD as Consulting Physician (Neurology) Consuella Lose, MD as Consulting Physician (Neurosurgery) Katy Apo, MD as Consulting Physician (Ophthalmology) Jonnie Finner, RN (Inactive) as Oncology Nurse Navigator Truitt Merle, MD as Consulting Physician (Hematology and Oncology) Leighton Ruff, MD as Consulting Physician (General Surgery)  Date of Service:  10/23/2020  CHIEF COMPLAINT: f/u of colon cancer  CURRENT THERAPY:  Xeloda (1040m in AM and 15084min PM, 2 weeks on/1 week off) and Keytruda q3weeks, starting 09/21/20  ASSESSMENT & PLAN:  Briana Ochoa a [age].o. female with   1. Colon cancer in Splenic Flexure, pT3N0M0, Stage II, MSI-High -she was diagnosed on 04/23/20 by Colonoscopy  -She underwent surgical resection with Dr ThMarcello Mooresn 05/28/20. Surgical pathology showed 9.7cm mass in splenic flexure with invasive adenocarcinoma, poorly differentiated was completely removed, negative margins, negative LNs, Stage II. She has intermediate risk of recurrence.  -GuardantReveal on 07/07/20 was positive, which likely indicating microscopic disease and high risk of recurrence. -PET scan was recommended but denied by her insurance. She proceeded with CT CAP w/o contrast on 08/31/20 showing no specific findings of active malignancy.  -She began Xeloda and Keytruda on 09/21/20. She takes Xeloda 100037mn AM and 1500m18m PM, 14 days on/7 days off, and she receives [place] 3 weeks. Plan to give Xeloda 3 to 6 months depending on her tolerance, and Keytruda 1-2 years if repeat scan showed  no evidence of recurrence  -She tolerated the first cycle treatment well overall, did have mild nausea and fatigue for a few days -Lab reviewed, her creatinine has increased to 1.7 today, will hold off Xeloda for now due to her worsening renal function, and proceed with [place] -She will receive IVF only today. -Repeat lab early next week, and start cycle 2 Xeloda, with possible dose adjustment based on renal function`  2. H/o DVT 04/2020 -she was found to have an elevated D-dimer back on 04/08/20. She was found to have DVT in the RLE. This was still present on 04/28/20 and she was started on Eliquis shortly after. -I discussed that her chemo can increase her risk of blood clots. She notes the Eliquis is expensive. I advised her to switch to baby aspirin.   3. Anemia of iron deficiency  -Secondary to #1 with GI blood loss -She required Blood transfusion and IV Iron. -resolved following surgery 05/2020   4. H/o of Left breast DCIS, H/o Bladder cancer, H/o Melanoma in 2017 of right lower leg -Diagnosed with DCIS in 2015, S/p left lumpectomy, Adjuvant Radiation and 5 years of Anastrozole. Treated by Dr GudeLindi Adieiagnosed with bladder cancer in 02/2018, early stage, S/p surgery. Followed by Dr. McKeAlyson Inglesxt f/u on 10/28/20. -She will continue surveillance.   5. Genetic Testing -Given her personably history of cancer and family history of cancer, I recommend genetic testing.  -performed 07/07/20, results negative   6. Comorbidites: CKD, DM, Heart murmur, H/o mini stroke from Brain aneurysm, HTN, Osteopenia -Continue medications and to f/u with other physicians for management.      PLAN:  -proceed to IVF today with normal saline 500 mL over 1 hour due to her worsening  renal function -hold Xeloda -labs only 10/27/20.  Depends on her renal function, plan to start second cycle Xeloda with possible dose adjustment -labs, f/u, and treatment in 3 weeks from today   No problem-specific  Assessment & Plan notes found for this encounter.   SUMMARY OF ONCOLOGIC HISTORY: Oncology History Overview Note  Cancer Staging Cancer of left colon Briana Ochoa) Staging form: Colon and Rectum, AJCC 8th Edition - Pathologic stage from 05/28/2020: Stage IIA (pT3, pN0, cM0) - Signed by Truitt Merle, MD on 06/19/2020 Stage prefix: Initial diagnosis Total positive nodes: 0 Histologic grading system: 4 grade system Histologic grade (G): G3 Residual tumor (R): R0 - None  Ductal carcinoma in situ (DCIS) of left breast Staging form: Breast, AJCC 7th Edition - Clinical: Stage 0 (Tis (DCIS), N0, cM0) - Unsigned Specimen type: Core Needle Biopsy Histopathologic type: 9932 Laterality: Left Staging comments: Staged at breast conference 7.22.15  - Pathologic: Stage 0 (Tis (DCIS)(2), N0, cM0) - Unsigned Specimen type: Core Needle Biopsy Histopathologic type: 9932 Laterality: Left Tumor size (mm): 3 Multiple tumors: Yes Number of tumors: 2 Method of lymph node assessment: Clinical Method of detection of distant metastases: Clinical Residual tumor (R): R0 - None Estrogen receptor status: Positive Progesterone receptor status: Positive    Ductal carcinoma in situ (DCIS) of left breast  08/20/2013 Initial Diagnosis   Left breast 12:00: DCIS with calcifications, yet 100%, PR 100%; 09/04/2013 second group of calcifications also biopsied proven to be DCIS ER/PR positive   09/19/2013 Surgery   Left breast lumpectomy DCIS 2 foci margins negative, 0.2 and 0.3 cm ER 100% PR 100%   10/08/2013 - 11/12/2013 Radiation Therapy   Adjuvant radiation therapy   11/12/2013 - 2020 Anti-estrogen oral therapy   Anastrozole 1 mg daily plan is for 5 years   01/09/2014 - 01/10/2014 [facility] Admission   Pipeline embolization 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 The report date is 07/30/2020.    The CancerNext-Expanded + RNAinsight gene panel offered by [employer] and includes sequencing and rearrangement analysis for the following 77 genes: AIP, ALK, APC, ATM, AXIN2, BAP1, BARD1, BLM, BMPR1A, BRCA1, BRCA2, BRIP1, CDC73, CDH1, CDK4, CDKN1B, CDKN2A, CHEK2, CTNNA1, DICER1, FANCC, FH, FLCN, GALNT12, KIF1B, LZTR1, MAX, MEN1, MET, MLH1, MSH2, MSH3, MSH6, MUTYH, NBN, NF1, NF2, NTHL1, PALB2, PHOX2B, PMS2, POT1, PRKAR1A, PTCH1, PTEN, RAD51C, RAD51D, RB1, RECQL, RET, SDHA, SDHAF2, SDHB, SDHC, SDHD, SMAD4, SMARCA4, SMARCB1, SMARCE1, STK11, SUFU, TMEM127, TP53, TSC1, TSC2, VHL and XRCC2 (sequencing and deletion/duplication); EGFR, EGLN1, HOXB13, KIT, MITF, PDGFRA, POLD1 and POLE (sequencing only); EPCAM and GREM1 (deletion/duplication only). RNA data is routinely analyzed for use in variant interpretation for all genes.   Bladder cancer ([place])  02/09/2018 Initial Diagnosis   Bladder cancer ([place])   07/30/2020 Genetic Testing   Negative genetic testing:  No pathogenic variants detected on the [employer] CancerNext-Expanded + RNAinsight panel. The report date is 07/30/2020.   The CancerNext-Expanded + RNAinsight gene panel offered by [employer] and includes sequencing and rearrangement analysis for the following 77 genes: AIP, ALK, APC, ATM, AXIN2, BAP1, BARD1, BLM, BMPR1A, BRCA1, BRCA2, BRIP1, CDC73, CDH1, CDK4, CDKN1B, CDKN2A, CHEK2, CTNNA1, DICER1, FANCC, FH, FLCN, GALNT12, KIF1B, LZTR1, MAX, MEN1, MET, MLH1, MSH2, MSH3, MSH6, MUTYH, NBN, NF1, NF2, NTHL1, PALB2, PHOX2B, PMS2, POT1, PRKAR1A, PTCH1, PTEN, RAD51C, RAD51D, RB1, RECQL, RET, SDHA, SDHAF2, SDHB, SDHC, SDHD, SMAD4, SMARCA4, SMARCB1, SMARCE1, STK11, SUFU, TMEM127,  TP53, TSC1, TSC2, VHL and XRCC2 (sequencing and deletion/duplication); EGFR, EGLN1, HOXB13, KIT, MITF, PDGFRA, POLD1 and POLE (sequencing only); EPCAM and GREM1 (deletion/duplication only). RNA data is routinely analyzed for use in variant interpretation for all genes.   08/31/2020  Imaging   CT CAP w/o contrast  IMPRESSION: 1. No specific findings of active malignancy on today's noncontrast CT. There has been interval partial colectomy involving the tumor site with reanastomosis. Some faint stranding in the adjacent omentum is probably from scarring or fat necrosis, less likely due to early tumor given the lack of overt nodularity. Surveillance imaging is likely indicated, and PET-CT may have a role in imaging follow up. 2. Several small pulmonary nodules, in addition to a larger mixed density nodule in the left upper lobe. Surveillance is recommended. 3. Other imaging findings of potential clinical significance: Aortic Atherosclerosis (ICD10-I70.0). Coronary atherosclerosis. Mitral valve calcification. Small type 1 hiatal hernia. Splenectomy with small amount of regenerative splenic tissue as well as a venous varix arising from the splenic vein. Hyperdense left kidney lower pole exophytic homogeneous lesion, probably a complex cyst. Sigmoid colon diverticulosis. Multilevel lumbar spondylosis and degenerative disc disease.   Cancer of left colon ([place])  04/23/2020 Procedure   Upper Endoscopy and Colonoscopy by Dr Carlean Purl IMPRESSION Erythematous mucosa in the antrum. Biopsied. - Small hiatal hernia. - Gastroesophageal flap valve classified as Hill Grade IV (no fold, wide open lumen, hiatal hernia present). - The examination was otherwise normal.   IMPRESSION - Decreased sphincter tone found on digital rectal exam. - Malignant partially obstructing tumor in the proximal descending colon. Biopsied. Tattooed. - Three 5 to 20 mm polyps in the sigmoid colon, removed piecemeal using a cold snare. Resected and retrieved. - Severe diverticulosis in the sigmoid colon. - I left several diminutive descending (proximal to tattoo), sigmoid and rectal polyps - The examination was otherwise normal on direct and retroflexion views.     04/23/2020 Initial Diagnosis    Diagnosis 1. Descending Colon Polyp, mass - ADENOCARCINOMA. 2. Sigmoid Colon Polyp, (3) - MULTIPLE FRAGMENTS OF TUBULAR ADENOMA. - MULTIPLE FRAGMENTS OF SESSILE SERRATED POLYP WITHOUT DYSPLASIA. - NO HIGH GRADE DYSPLASIA OR MALIGNANCY. Microscopic Comment 1. Dr. Vic Ripper has reviewed the case. Dr. Carlean Purl was notified on 04/24/2020. 2. Despite being left sided the fragments have features of a sessile serrated polyp, as opposed to, hyperplastic polyp.   04/27/2020 Imaging   CT AP IMPRESSION: Bulky annular soft tissue mass involving the distal transverse colon with extension into adjacent pericolonic fat, consistent with primary colon carcinoma.   No evidence of metastatic disease.   Colonic diverticulosis. No radiographic evidence of diverticulitis.   Aortic Atherosclerosis (ICD10-I70.0).     05/05/2020 Imaging   CT Chest IMPRESSION: Bulky annular soft tissue mass involving the distal transverse colon with extension into adjacent pericolonic fat, consistent with primary colon carcinoma.   No evidence of metastatic disease.   Colonic diverticulosis. No radiographic evidence of diverticulitis.   Aortic Atherosclerosis (ICD10-I70.0).     05/28/2020 Initial Diagnosis   Cancer of left colon ([place])   05/28/2020 Cancer Staging   Staging form: Colon and Rectum, AJCC 8th Edition - Pathologic stage from 05/28/2020: Stage IIA (pT3, pN0, cM0) - Signed by Truitt Merle, MD on 06/19/2020 Stage prefix: Initial diagnosis Total positive nodes: 0 Histologic grading system: 4 grade system Histologic grade (G): G3 Residual tumor (R): R0 - None   05/28/2020 Surgery   XI ROBOT ASSISTED RESECTION OF SPLENIC FLEXURE by Dr Marcello Moores    FINAL  MICROSCOPIC DIAGNOSIS:   A. COLON, SPLENIC FLEXURE, RESECTION:  - Invasive adenocarcinoma, poorly differentiated, spanning 9.7 cm.  - Tumor invades through muscularis propria into pericolonic tissue.  - Resection margins are negative.  - Tubular adenoma (X1).   - Sessile serrate polyp without dysplasia (x2).  - Diverticulosis.  - Polypectomy scar.  - Twenty-four of twenty-four lymph nodes negative for carcinoma (0/24).  - See oncology table.    07/07/2020 Miscellaneous   Guardant Reveal ctDNA positive MSI high   07/30/2020 Genetic Testing   Negative genetic testing:  No pathogenic variants detected on the [employer] CancerNext-Expanded + RNAinsight panel. The report date is 07/30/2020.   The CancerNext-Expanded + RNAinsight gene panel offered by [employer] and includes sequencing and rearrangement analysis for the following 77 genes: AIP, ALK, APC, ATM, AXIN2, BAP1, BARD1, BLM, BMPR1A, BRCA1, BRCA2, BRIP1, CDC73, CDH1, CDK4, CDKN1B, CDKN2A, CHEK2, CTNNA1, DICER1, FANCC, FH, FLCN, GALNT12, KIF1B, LZTR1, MAX, MEN1, MET, MLH1, MSH2, MSH3, MSH6, MUTYH, NBN, NF1, NF2, NTHL1, PALB2, PHOX2B, PMS2, POT1, PRKAR1A, PTCH1, PTEN, RAD51C, RAD51D, RB1, RECQL, RET, SDHA, SDHAF2, SDHB, SDHC, SDHD, SMAD4, SMARCA4, SMARCB1, SMARCE1, STK11, SUFU, TMEM127, TP53, TSC1, TSC2, VHL and XRCC2 (sequencing and deletion/duplication); EGFR, EGLN1, HOXB13, KIT, MITF, PDGFRA, POLD1 and POLE (sequencing only); EPCAM and GREM1 (deletion/duplication only). RNA data is routinely analyzed for use in variant interpretation for all genes.   08/31/2020 Imaging   CT CAP w/o contrast  IMPRESSION: 1. No specific findings of active malignancy on today's noncontrast CT. There has been interval partial colectomy involving the tumor site with reanastomosis. Some faint stranding in the adjacent omentum is probably from scarring or fat necrosis, less likely due to early tumor given the lack of overt nodularity. Surveillance imaging is likely indicated, and PET-CT may have a role in imaging follow up. 2. Several small pulmonary nodules, in addition to a larger mixed density nodule in the left upper lobe. Surveillance is recommended. 3. Other imaging findings of potential clinical significance:  Aortic Atherosclerosis (ICD10-I70.0). Coronary atherosclerosis. Mitral valve calcification. Small type 1 hiatal hernia. Splenectomy with small amount of regenerative splenic tissue as well as a venous varix arising from the splenic vein. Hyperdense left kidney lower pole exophytic homogeneous lesion, probably a complex cyst. Sigmoid colon diverticulosis. Multilevel lumbar spondylosis and degenerative disc disease.   10/02/2020 -  Chemotherapy    Patient is on Treatment Plan: COLORECTAL PEMBROLIZUMAB Q21D          INTERVAL HISTORY:  Shonda Mandarino Iwanicki is here for a follow up of colon cancer. She was last seen by me on 10/09/20. She presents to the clinic alone. She reports she is feeling well overall.    All other systems were reviewed with the patient and are negative.  MEDICAL HISTORY:  Past Medical History:  Diagnosis Date   Allergy    seasonal allergies   Anemia    on meds   Aortic atherosclerosis (HCC)    Bladder cancer ([place]) 2020   Bladder tumor    Bloody diarrhea 09/29/2017   Breast cancer ([place]) 2015   Left Breast Cancer   Cataract    bilateral -sx   CKD (chronic kidney disease), stage III (HCC)    DM related   Colon cancer ([place])    Diabetes mellitus type 2, diet-controlled ([place])    diet controlled   Dyspnea 09/28/2017   Dysuria    Family history of adverse reaction to anesthesia    aunt- N/V    Family history of breast cancer  Family history of kidney cancer    Family history of throat cancer    Family history of uterine cancer    Fatigue 09/28/2017   Frequency of urination    Grade I diastolic dysfunction 93/81/0175   Noted on ECHO   Gross hematuria 09/29/2017   Heart murmur    since rheumatic fever as child   Hematuria    History of cardiomegaly 02/12/2005   Mild, noted on CXR   History of CVA (cerebrovascular accident) 08/05/2014   post op cerebral angiogram, cerebral thrombosis w/ cerebral infarction;  11-02-2017  per pt no residuals    History of malignant melanoma of skin 12/2015   s/p wide local excision right lower leg (per pt localized)   History of rheumatic fever as a child    Hyperlipidemia    on meds   Hypertension    on meds   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   Sigmoid diverticulosis    Weakness 09/28/2017   Wears dentures    upper   Wears glasses    Wears hearing aid in both ears     SURGICAL HISTORY: Past Surgical History:  Procedure Laterality Date   BREAST LUMPECTOMY Left 2015   BREAST LUMPECTOMY WITH NEEDLE LOCALIZATION Left 09/19/2013   Procedure: LEFT BREAST LUMPECTOMY WITH DOUBLE WIRE BRACKETED  NEEDLE LOCALIZATION;  Surgeon: [name], MD;  Location: [facility];  Service: General;  Laterality: Left;   CATARACT EXTRACTION W/ INTRAOCULAR LENS  IMPLANT, BILATERAL  06/2015   CESAREAN SECTION  1964   CHOLECYSTECTOMY OPEN  1970s   CYSTOSCOPY W/ URETERAL STENT PLACEMENT Left 11/07/2017   Procedure: CYSTOSCOPY WITH RETROGRADE PYELOGRAM/URETERAL STENT PLACEMENT;  Surgeon: Cleon Gustin, MD;  Location: [facility];  Service: Urology;  Laterality: Left;   CYSTOSCOPY W/ URETERAL STENT PLACEMENT Bilateral 04/27/2018   Procedure: CYSTOSCOPY WITH RETROGRADE PYELOGRAM;  Surgeon: Cleon Gustin, MD;  Location: [facility];  Service: Urology;  Laterality: Bilateral;   FINGER SURGERY     lt thumb  from  dog bite   IR  NEPHROURETERAL CATH PLACE LEFT  04/28/2018   MELANOMA EXCISION Right 12/28/2015   Procedure: EXCISION MELANOMA RIGHT LOWER LEG;  Surgeon: Fanny Skates, MD;  Location: [facility];  Service: General;  Laterality: Right;   RADIOLOGY WITH ANESTHESIA N/A 01/09/2014   Procedure: Embolization;  Surgeon: Consuella Lose, MD;  Location: [facility];  Service: Radiology;  Laterality: N/A;   SPLENECTOMY, TOTAL  2009 approx.   splen injury due to fall off horse   TONSILLECTOMY  child   TRANSURETHRAL RESECTION OF BLADDER TUMOR N/A 11/07/2017   Procedure: TRANSURETHRAL RESECTION OF BLADDER TUMOR (TURBT), POSSIBLE STENT PLACEMENT;  Surgeon: Cleon Gustin, MD;  [facility]: [facility];  Service: Urology;  Laterality: N/A;   TRANSURETHRAL RESECTION OF BLADDER TUMOR N/A 12/14/2017  Procedure: TRANSURETHRAL RESECTION OF BLADDER TUMOR (TURBT);  Surgeon: Cleon Gustin, MD;  Location[facility];  Service: Urology;  Laterality: N/A;  [place] TUMOR N/A 04/27/2018   Procedure: TRANSURETHRAL RESECTION OF BLADDER TUMOR (TURBT);  Surgeon: Cleon Gustin, MD;  Location: [facility];  Service: Urology;  Laterality: N/A;  63 MINS    I have reviewed the social history and family history with the patient and they are unchanged from previous note.  ALLERGIES:  is allergic to feraheme [ferumoxytol].  MEDICATIONS:  Current Outpatient Medications  Medication Sig Dispense Refill   acetaminophen (TYLENOL) 500 MG tablet Take 1,000 mg by mouth daily.     apixaban (ELIQUIS) 5 MG TABS tablet Take 62m BID x7days, then 529mBID continuously, with food (Patient taking differently: Take 5-10 mg by mouth See admin instructions. Take 1036mwice daily x7days, then 5mg45mice daily continuously, with food) 180 tablet 0   bismuth subsalicylate (PEPTO BISMOL) 262 MG/15ML suspension Take 30 mLs by mouth every 6 (six) hours  as needed for diarrhea or loose stools.     capecitabine (XELODA) 500 MG tablet Take 2 tablets (1000 mg total) by mouth in morning and 3 tablets (1500 mg total) by mouth in evening. Take within 30 minutes after meals. Take for 14 days on and 7 days off. Repeat every 21 days. 70 tablet 0   ferrous gluconate (FERGON) 324 MG tablet Take 1 tablet (324 mg total) by mouth 2 (two) times daily with a meal. 180 tablet 0   glucose blood (TRUE METRIX BLOOD GLUCOSE TEST) test strip Use as instructed to test blood sugar once daily E08.638 100 each 2   lisinopril (ZESTRIL) 20 MG tablet TAKE 1 TABLET EVERY DAY 90 tablet 3   ondansetron (ZOFRAN) 8 MG tablet Take 1 tablet (8 mg total) by mouth every 8 (eight) hours as needed for nausea or vomiting. 20 tablet 0   pravastatin (PRAVACHOL) 10 MG tablet TAKE 1 TABLET EVERY DAY (STOP PRAVASTATIN 20MG) 90 tablet 3   traMADol (ULTRAM) 50 MG tablet Take 1 tablet (50 mg total) by mouth every 6 (six) hours as needed for moderate pain. 25 tablet 0   No current facility-administered medications for this visit.    PHYSICAL EXAMINATION: ECOG PERFORMANCE STATUS: 1 - Symptomatic but completely ambulatory  Vitals:   10/23/20 1133  BP: 134/84  Pulse: 69  Resp: 18  Temp: 97.8 F (36.6 C)  SpO2: 98%   Wt Readings from Last 3 Encounters:  10/23/20 173 lb 11.2 oz (78.8 kg)  10/09/20 171 lb 6.4 oz (77.7 kg)  09/21/20 170 lb 1.6 oz (77.2 kg)     GENERAL:alert, no distress and comfortable SKIN: skin color normal, no rashes or significant lesions EYES: normal, Conjunctiva are pink and non-injected, sclera clear  NEURO: alert & oriented x 3 with fluent speech  LABORATORY DATA:  I have reviewed the data as listed CBC Latest Ref Rng & Units 10/23/2020 10/09/2020 09/21/2020  WBC 4.0 - 10.5 K/uL 9.3 8.6 9.3  Hemoglobin 12.0 - 15.0 g/dL 13.6 13.4 13.4  Hematocrit 36.0 - 46.0 % 41.0 41.3 41.2  Platelets 150 - 400 K/uL 299 338 337     CMP Latest Ref Rng & Units 10/23/2020  10/09/2020 09/21/2020  Glucose 70 - 99 mg/dL 103(H) 91 107(H)  BUN 8 - 23 mg/dL 27(H) 27(H) 24(H)  Creatinine 0.44 - 1.00 mg/dL 1.73(H) 1.37(H) 1.47(H)  Sodium 135 - 145  mmol/L 140 141 142  Potassium 3.5 - 5.1 mmol/L 4.7 4.6 5.1  Chloride 98 - 111 mmol/L 108 107 108  CO2 22 - 32 mmol/L 23 24 26   Calcium 8.9 - 10.3 mg/dL 9.5 9.6 9.6  Total Protein 6.5 - 8.1 g/dL 6.9 6.8 6.9  Total Bilirubin 0.3 - 1.2 mg/dL 0.3 0.3 0.3  Alkaline Phos 38 - 126 U/L 102 85 85  AST 15 - 41 U/L 17 14(L) 16  ALT 0 - 44 U/L 10 11 14       RADIOGRAPHIC STUDIES: I have personally reviewed the radiological images as listed and agreed with the findings in the report. No results found.    No orders of the defined types were placed in this encounter.  All questions were answered. The patient knows to call the clinic with any problems, questions or concerns. No barriers to learning was detected. The total time spent in the appointment was 30 minutes.     Truitt Merle, MD 10/23/2020   I, Wilburn Mylar, am acting as scribe for Truitt Merle, MD.   I have reviewed the above documentation for accuracy and completeness, and I agree with the above.   Addendum  I called pt and left her a VM at home phone. Due to persistently elevated Cr (EGFR 29) yesterday, will hold on Xeloda for this cycle, and re-evaluate on her next visit. I asked her to call back to confirm that she received the message. I will send a Mychart message also.   Truitt Merle  10/28/2020

## 2020-10-23 NOTE — Progress Notes (Signed)
Ok to treat with current labs per Dr. Burr Medico

## 2020-10-26 ENCOUNTER — Telehealth: Payer: Self-pay | Admitting: Hematology

## 2020-10-26 ENCOUNTER — Other Ambulatory Visit: Payer: Self-pay

## 2020-10-26 NOTE — Telephone Encounter (Signed)
Scheduled follow-up appointment per 9/16 los. Patient is aware.

## 2020-10-27 ENCOUNTER — Inpatient Hospital Stay: Payer: Medicare PPO

## 2020-10-27 ENCOUNTER — Other Ambulatory Visit: Payer: Self-pay

## 2020-10-27 DIAGNOSIS — C186 Malignant neoplasm of descending colon: Secondary | ICD-10-CM

## 2020-10-27 DIAGNOSIS — Z5112 Encounter for antineoplastic immunotherapy: Secondary | ICD-10-CM | POA: Diagnosis not present

## 2020-10-27 LAB — FERRITIN: Ferritin: 23 ng/mL (ref 11–307)

## 2020-10-27 LAB — CBC WITH DIFFERENTIAL (CANCER CENTER ONLY)
Abs Immature Granulocytes: 0.03 10*3/uL (ref 0.00–0.07)
Basophils Absolute: 0.1 10*3/uL (ref 0.0–0.1)
Basophils Relative: 1 %
Eosinophils Absolute: 0.5 10*3/uL (ref 0.0–0.5)
Eosinophils Relative: 6 %
HCT: 39.7 % (ref 36.0–46.0)
Hemoglobin: 13.1 g/dL (ref 12.0–15.0)
Immature Granulocytes: 0 %
Lymphocytes Relative: 24 %
Lymphs Abs: 2.2 10*3/uL (ref 0.7–4.0)
MCH: 30.8 pg (ref 26.0–34.0)
MCHC: 33 g/dL (ref 30.0–36.0)
MCV: 93.2 fL (ref 80.0–100.0)
Monocytes Absolute: 1.2 10*3/uL — ABNORMAL HIGH (ref 0.1–1.0)
Monocytes Relative: 13 %
Neutro Abs: 5.2 10*3/uL (ref 1.7–7.7)
Neutrophils Relative %: 56 %
Platelet Count: 283 10*3/uL (ref 150–400)
RBC: 4.26 MIL/uL (ref 3.87–5.11)
RDW: 17.8 % — ABNORMAL HIGH (ref 11.5–15.5)
WBC Count: 9.2 10*3/uL (ref 4.0–10.5)
nRBC: 0 % (ref 0.0–0.2)

## 2020-10-27 LAB — CMP (CANCER CENTER ONLY)
ALT: 11 U/L (ref 0–44)
AST: 14 U/L — ABNORMAL LOW (ref 15–41)
Albumin: 3.7 g/dL (ref 3.5–5.0)
Alkaline Phosphatase: 96 U/L (ref 38–126)
Anion gap: 9 (ref 5–15)
BUN: 29 mg/dL — ABNORMAL HIGH (ref 8–23)
CO2: 25 mmol/L (ref 22–32)
Calcium: 9.5 mg/dL (ref 8.9–10.3)
Chloride: 107 mmol/L (ref 98–111)
Creatinine: 1.78 mg/dL — ABNORMAL HIGH (ref 0.44–1.00)
GFR, Estimated: 29 mL/min — ABNORMAL LOW (ref >60)
Glucose, Bld: 158 mg/dL — ABNORMAL HIGH (ref 70–99)
Potassium: 4.4 mmol/L (ref 3.5–5.1)
Sodium: 141 mmol/L (ref 135–145)
Total Bilirubin: 0.4 mg/dL (ref 0.3–1.2)
Total Protein: 6.8 g/dL (ref 6.5–8.1)

## 2020-10-27 LAB — IRON AND TIBC
Iron: 53 ug/dL (ref 28–170)
Saturation Ratios: 13 % (ref 10.4–31.8)
TIBC: 421 ug/dL (ref 250–450)
UIBC: 368 ug/dL

## 2020-10-28 ENCOUNTER — Other Ambulatory Visit: Payer: Medicare PPO | Admitting: Urology

## 2020-10-28 ENCOUNTER — Encounter: Payer: Self-pay | Admitting: Hematology

## 2020-10-28 NOTE — Addendum Note (Signed)
Addended by: Truitt Merle on: 10/28/2020 04:46 PM   Modules accepted: Orders

## 2020-11-02 ENCOUNTER — Encounter: Payer: Self-pay | Admitting: Nurse Practitioner

## 2020-11-02 ENCOUNTER — Ambulatory Visit: Payer: Medicare PPO | Admitting: Nurse Practitioner

## 2020-11-02 ENCOUNTER — Other Ambulatory Visit: Payer: Self-pay

## 2020-11-02 VITALS — BP 112/60 | HR 68 | Temp 97.3°F | Ht 64.0 in | Wt 173.6 lb

## 2020-11-02 DIAGNOSIS — E1141 Type 2 diabetes mellitus with diabetic mononeuropathy: Secondary | ICD-10-CM | POA: Diagnosis not present

## 2020-11-02 DIAGNOSIS — I10 Essential (primary) hypertension: Secondary | ICD-10-CM | POA: Diagnosis not present

## 2020-11-02 DIAGNOSIS — I82561 Chronic embolism and thrombosis of right calf muscular vein: Secondary | ICD-10-CM | POA: Diagnosis not present

## 2020-11-02 DIAGNOSIS — Z23 Encounter for immunization: Secondary | ICD-10-CM | POA: Diagnosis not present

## 2020-11-02 DIAGNOSIS — R739 Hyperglycemia, unspecified: Secondary | ICD-10-CM | POA: Insufficient documentation

## 2020-11-02 LAB — POCT GLYCOSYLATED HEMOGLOBIN (HGB A1C): Hemoglobin A1C: 6.2 % — AB (ref 4.0–5.6)

## 2020-11-02 NOTE — Assessment & Plan Note (Signed)
No pain and no swelling. D/c eliquis 1.51months ago due to high copay. Current use of aspirin 65my Resolved GI bleed.  Agreed to repeat venous doppler. Order entered Maintain ASA dose while waiting for [place] results

## 2020-11-02 NOTE — Assessment & Plan Note (Signed)
Stable BP with lisinopril BP Readings from Last 3 Encounters:  11/02/20 112/60  10/23/20 134/84  10/09/20 (!) 145/71   Reviewed CMP completed by oncology CMP     Component Value Date/Time   NA 141 10/27/2020 1448   K 4.4 10/27/2020 1448   CL 107 10/27/2020 1448   CO2 25 10/27/2020 1448   GLUCOSE 158 (H) 10/27/2020 1448   BUN 29 (H) 10/27/2020 1448   CREATININE 1.78 (H) 10/27/2020 1448   CALCIUM 9.5 10/27/2020 1448   PROT 6.8 10/27/2020 1448   ALBUMIN 3.7 10/27/2020 1448   AST 14 (L) 10/27/2020 1448   ALT 11 10/27/2020 1448   ALKPHOS 96 10/27/2020 1448   BILITOT 0.4 10/27/2020 1448   GFRNONAA 29 (L) 10/27/2020 1448   GFRAA 42 (L) 04/28/2018 0313   Maintain current lisinopril dose F/up in 86months

## 2020-11-02 NOTE — Progress Notes (Signed)
Subjective:  Patient ID: Briana Ochoa, female    DOB: Feb 02, 1941  Age: 80 y.o. MRN: [id number]  CC: Medication Problem (Pt would like to discuss Eliquis medication. Pt states she was informed she needed a follow up appointment for refills. Pt discussed with her oncologist to stop the medication because she doesn't want to take it. Pt states the oncologist put her on a baby aspirin instead. Pt states medication was also very expensive. )  HPI  HTN (hypertension) Stable BP with lisinopril BP Readings from Last 3 Encounters:  11/02/20 112/60  10/23/20 134/84  10/09/20 (!) 145/71   Reviewed CMP completed by oncology CMP     Component Value Date/Time   NA 141 10/27/2020 1448   K 4.4 10/27/2020 1448   CL 107 10/27/2020 1448   CO2 25 10/27/2020 1448   GLUCOSE 158 (H) 10/27/2020 1448   BUN 29 (H) 10/27/2020 1448   CREATININE 1.78 (H) 10/27/2020 1448   CALCIUM 9.5 10/27/2020 1448   PROT 6.8 10/27/2020 1448   ALBUMIN 3.7 10/27/2020 1448   AST 14 (L) 10/27/2020 1448   ALT 11 10/27/2020 1448   ALKPHOS 96 10/27/2020 1448   BILITOT 0.4 10/27/2020 1448   GFRNONAA 29 (L) 10/27/2020 1448   GFRAA 42 (L) 04/28/2018 0313   Maintain current lisinopril dose F/up in 33months  Acute deep vein thrombosis (DVT) of calf muscle vein of right lower extremity (HCC) No pain and no swelling. D/c eliquis 1.7months ago due to high copay. Current use of aspirin 75my Resolved GI bleed.  Agreed to repeat venous doppler. Order entered Maintain ASA dose while waiting for [place] results  Diabetes mellitus ([place]) Controlled with hgbA1c at 6.2% today Advised to maintain low carb/low sugar diet. Repeat in 57months BP Readings from Last 3 Encounters:  11/02/20 112/60  10/23/20 134/84  10/09/20 (!) 145/71    Wt Readings from Last 3 Encounters:  11/02/20 173 lb 9.6 oz (78.7 kg)  10/23/20 173 lb 11.2 oz (78.8 kg)  10/09/20 171 lb 6.4 oz (77.7 kg)     Reviewed past Medical, Social and Family  history today.  Outpatient Medications Prior to Visit  Medication Sig Dispense Refill   acetaminophen (TYLENOL) 500 MG tablet Take 1,000 mg by mouth daily.     capecitabine (XELODA) 500 MG tablet Take 2 tablets (1000 mg total) by mouth in morning and 3 tablets (1500 mg total) by mouth in evening. Take within 30 minutes after meals. Take for 14 days on and 7 days off. Repeat every 21 days. 70 tablet 0   glucose blood (TRUE METRIX BLOOD GLUCOSE TEST) test strip Use as instructed to test blood sugar once daily E08.638 100 each 2   lisinopril (ZESTRIL) 20 MG tablet TAKE 1 TABLET EVERY DAY 90 tablet 3   ondansetron (ZOFRAN) 8 MG tablet Take 1 tablet (8 mg total) by mouth every 8 (eight) hours as needed for nausea or vomiting. 20 tablet 0   pravastatin (PRAVACHOL) 10 MG tablet TAKE 1 TABLET EVERY DAY (STOP PRAVASTATIN 20MG ) 90 tablet 3   apixaban (ELIQUIS) 5 MG TABS tablet Take 10mg  BID x7days, then 5mg  BID continuously, with food (Patient not taking: Reported on 11/02/2020) 180 tablet 0   bismuth subsalicylate (PEPTO BISMOL) 262 MG/15ML suspension Take 30 mLs by mouth every 6 (six) hours as needed for diarrhea or loose stools. (Patient not taking: Reported on 11/02/2020)     ferrous gluconate (FERGON) 324 MG tablet Take 1 tablet (324 mg total) by mouth  2 (two) times daily with a meal. (Patient not taking: Reported on 11/02/2020) 180 tablet 0   traMADol (ULTRAM) 50 MG tablet Take 1 tablet (50 mg total) by mouth every 6 (six) hours as needed for moderate pain. 25 tablet 0   No facility-administered medications prior to visit.    ROS See HPI  Objective:  BP 112/60 (BP Location: Left Arm, Patient Position: Sitting, Cuff Size: Large)   Pulse 68   Temp (!) 97.3 F (36.3 C) (Temporal)   Ht 5\' 4"  (1.626 m)   Wt 173 lb 9.6 oz (78.7 kg)   SpO2 97%   BMI 29.80 kg/m   Physical Exam  Assessment & Plan:  This visit occurred during the SARS-CoV-2 public health emergency.  Safety protocols were in place,  including screening questions prior to the visit, additional usage of staff PPE, and extensive cleaning of exam room while observing appropriate contact time as indicated for disinfecting solutions.   Analeise was seen today for medication problem.  Diagnoses and all orders for this visit:  Chronic deep vein thrombosis (DVT) of calf muscle vein of right lower extremity (HCC) -     VAS [place] LOWER EXTREMITY VENOUS (DVT); Future  Flu vaccine need -     Flu Vaccine QUAD High Dose(Fluad)  Primary hypertension  Type 2 diabetes mellitus with diabetic mononeuropathy, without long-term current use of insulin (HCC) -     POCT glycosylated hemoglobin (Hb A1C)  Problem List Items Addressed This Visit       Cardiovascular and Mediastinum   Acute deep vein thrombosis (DVT) of calf muscle vein of right lower extremity (HCC) - Primary    No pain and no swelling. D/c eliquis 1.75months ago due to high copay. Current use of aspirin 39my Resolved GI bleed.  Agreed to repeat venous doppler. Order entered Maintain ASA dose while waiting for [place] results      HTN (hypertension)    Stable BP with lisinopril BP Readings from Last 3 Encounters:  11/02/20 112/60  10/23/20 134/84  10/09/20 (!) 145/71   Reviewed CMP completed by oncology CMP     Component Value Date/Time   NA 141 10/27/2020 1448   K 4.4 10/27/2020 1448   CL 107 10/27/2020 1448   CO2 25 10/27/2020 1448   GLUCOSE 158 (H) 10/27/2020 1448   BUN 29 (H) 10/27/2020 1448   CREATININE 1.78 (H) 10/27/2020 1448   CALCIUM 9.5 10/27/2020 1448   PROT 6.8 10/27/2020 1448   ALBUMIN 3.7 10/27/2020 1448   AST 14 (L) 10/27/2020 1448   ALT 11 10/27/2020 1448   ALKPHOS 96 10/27/2020 1448   BILITOT 0.4 10/27/2020 1448   GFRNONAA 29 (L) 10/27/2020 1448   GFRAA 42 (L) 04/28/2018 0313   Maintain current lisinopril dose F/up in 77months        Endocrine   Diabetes mellitus ([place])    Controlled with hgbA1c at 6.2% today Advised to maintain low  carb/low sugar diet. Repeat in 26months      Relevant Orders   POCT glycosylated hemoglobin (Hb A1C)   Other Visit Diagnoses     Flu vaccine need       Relevant Orders   Flu Vaccine QUAD High Dose(Fluad) (Completed)       Follow-up: Return in about 6 months (around 05/02/2021) for hyperlipidemia and prediabetes (fasting).  Wilfred Lacy, NP

## 2020-11-02 NOTE — Patient Instructions (Addendum)
Continue Aspirin 81mg  daily You will be contacted to schedule appt for repeat venous doppler.  HgbA1c of 6.2% today: this indicate increase risk of diabetes  Prediabetes Eating Plan Prediabetes is a condition that causes blood sugar (glucose) levels to be higher than normal. This increases the risk for developing type 2 diabetes (type 2 diabetes mellitus). Working with a health care provider or nutrition specialist (dietitian) to make diet and lifestyle changes can help prevent the onset of diabetes. These changes may help you: Control your blood glucose levels. Improve your cholesterol levels. Manage your blood pressure. What are tips for following this plan? Reading food labels Read food labels to check the amount of fat, salt (sodium), and sugar in prepackaged foods. Avoid foods that have: Saturated fats. Trans fats. Added sugars. Avoid foods that have more than 300 milligrams (mg) of sodium per serving. Limit your sodium intake to less than 2,300 mg each day. Shopping Avoid buying pre-made and processed foods. Avoid buying drinks with added sugar. Cooking Cook with olive oil. Do not use butter, lard, or ghee. Bake, broil, grill, steam, or boil foods. Avoid frying. Meal planning  Work with your dietitian to create an eating plan that is right for you. This may include tracking how many calories you take in each day. Use a food diary, notebook, or mobile application to track what you eat at each meal. Consider following a Mediterranean diet. This includes: Eating several servings of fresh fruits and vegetables each day. Eating fish at least twice a week. Eating one serving each day of whole grains, beans, nuts, and seeds. Using olive oil instead of other fats. Limiting alcohol. Limiting red meat. Using nonfat or low-fat dairy products. Consider following a plant-based diet. This includes dietary choices that focus on eating mostly vegetables and fruit, grains, beans, nuts, and  seeds. If you have high blood pressure, you may need to limit your sodium intake or follow a diet such as the DASH (Dietary Approaches to Stop Hypertension) eating plan. The DASH diet aims to lower high blood pressure. Lifestyle Set weight loss goals with help from your health care team. It is recommended that most people with prediabetes lose 7% of their body weight. Exercise for at least 30 minutes 5 or more days a week. Attend a support group or seek support from a mental health counselor. Take over-the-counter and prescription medicines only as told by your health care provider. What foods are recommended? Fruits Berries. Bananas. Apples. Oranges. Grapes. Papaya. Mango. Pomegranate. Kiwi. Grapefruit. Cherries. Vegetables Lettuce. Spinach. Peas. Beets. Cauliflower. Cabbage. Broccoli. Carrots. Tomatoes. Squash. Eggplant. Herbs. Peppers. Onions. Cucumbers. Brussels sprouts. Grains Whole grains, such as whole-wheat or whole-grain breads, crackers, cereals, and pasta. Unsweetened oatmeal. Bulgur. Barley. Quinoa. Brown rice. Corn or whole-wheat flour tortillas or taco shells. Meats and other proteins Seafood. Poultry without skin. Lean cuts of pork and beef. Tofu. Eggs. Nuts. Beans. Dairy Low-fat or fat-free dairy products, such as yogurt, cottage cheese, and cheese. Beverages Water. Tea. Coffee. Sugar-free or diet soda. Seltzer water. Low-fat or nonfat milk. Milk alternatives, such as soy or almond milk. Fats and oils Olive oil. Canola oil. Sunflower oil. Grapeseed oil. Avocado. Walnuts. Sweets and desserts Sugar-free or low-fat pudding. Sugar-free or low-fat ice cream and other frozen treats. Seasonings and condiments Herbs. Sodium-free spices. Mustard. Relish. Low-salt, low-sugar ketchup. Low-salt, low-sugar barbecue sauce. Low-fat or fat-free mayonnaise. The items listed above may not be a complete list of recommended foods and beverages. Contact a dietitian for more information.  What  foods are not recommended? Fruits Fruits canned with syrup. Vegetables Canned vegetables. Frozen vegetables with butter or cream sauce. Grains Refined white flour and flour products, such as bread, pasta, snack foods, and cereals. Meats and other proteins Fatty cuts of meat. Poultry with skin. Breaded or fried meat. Processed meats. Dairy Full-fat yogurt, cheese, or milk. Beverages Sweetened drinks, such as iced tea and soda. Fats and oils Butter. Lard. Ghee. Sweets and desserts Baked goods, such as cake, cupcakes, pastries, cookies, and cheesecake. Seasonings and condiments Spice mixes with added salt. Ketchup. Barbecue sauce. Mayonnaise. The items listed above may not be a complete list of foods and beverages that are not recommended. Contact a dietitian for more information. Where to find more information American Diabetes Association: www.diabetes.org Summary You may need to make diet and lifestyle changes to help prevent the onset of diabetes. These changes can help you control blood sugar, improve cholesterol levels, and manage blood pressure. Set weight loss goals with help from your health care team. It is recommended that most people with prediabetes lose 7% of their body weight. Consider following a Mediterranean diet. This includes eating plenty of fresh fruits and vegetables, whole grains, beans, nuts, seeds, fish, and low-fat dairy, and using olive oil instead of other fats. This information is not intended to replace advice given to you by your health care provider. Make sure you discuss any questions you have with your health care provider. Document Revised: 04/25/2019 Document Reviewed: 04/25/2019 Elsevier Patient Education  [place].

## 2020-11-02 NOTE — Assessment & Plan Note (Signed)
Controlled with hgbA1c at 6.2% today Advised to maintain low carb/low sugar diet. Repeat in 29months

## 2020-11-03 ENCOUNTER — Ambulatory Visit (HOSPITAL_COMMUNITY)
Admission: RE | Admit: 2020-11-03 | Discharge: 2020-11-03 | Disposition: A | Discharge status: Home / Self Care | Payer: Medicare PPO | Source: Ambulatory Visit | Attending: Cardiovascular Disease | Admitting: Cardiovascular Disease

## 2020-11-03 DIAGNOSIS — I82561 Chronic embolism and thrombosis of right calf muscular vein: Secondary | ICD-10-CM | POA: Diagnosis not present

## 2020-11-04 ENCOUNTER — Ambulatory Visit (INDEPENDENT_AMBULATORY_CARE_PROVIDER_SITE_OTHER): Payer: Medicare PPO | Admitting: Urology

## 2020-11-04 ENCOUNTER — Other Ambulatory Visit: Payer: Self-pay

## 2020-11-04 ENCOUNTER — Encounter: Payer: Self-pay | Admitting: Urology

## 2020-11-04 VITALS — BP 164/84 | HR 69 | Temp 97.8°F | Ht 64.0 in | Wt 173.4 lb

## 2020-11-04 DIAGNOSIS — C679 Malignant neoplasm of bladder, unspecified: Secondary | ICD-10-CM | POA: Diagnosis not present

## 2020-11-04 LAB — URINALYSIS, ROUTINE W REFLEX MICROSCOPIC
Bilirubin, UA: NEGATIVE
Glucose, UA: NEGATIVE
Ketones, UA: NEGATIVE
Leukocytes,UA: NEGATIVE
Nitrite, UA: NEGATIVE
Protein,UA: NEGATIVE
RBC, UA: NEGATIVE
Specific Gravity, UA: 1.02 (ref 1.005–1.030)
Urobilinogen, Ur: 0.2 mg/dL (ref 0.2–1.0)
pH, UA: 6.5 (ref 5.0–7.5)

## 2020-11-04 MED ORDER — CIPROFLOXACIN HCL 500 MG PO TABS
500.0000 mg | ORAL_TABLET | Freq: Once | ORAL | Status: AC
  Administered 2020-11-04: 500 mg via ORAL

## 2020-11-04 NOTE — Progress Notes (Signed)
Urological Symptom Review  Patient is experiencing the following symptoms: Frequent urination Get up at night to urinate Leakage of urine   Review of Systems  Gastrointestinal (upper)  : Negative for upper GI symptoms  Gastrointestinal (lower) : Negative for lower GI symptoms  Constitutional : Negative for symptoms  Skin: Negative for skin symptoms  Eyes: Negative for eye symptoms  Ear/Nose/Throat : Negative for Ear/Nose/Throat symptoms  Hematologic/Lymphatic: Negative for Hematologic/Lymphatic symptoms  Cardiovascular : Negative for cardiovascular symptoms  Respiratory : Negative for respiratory symptoms  Endocrine: Negative for endocrine symptoms  Musculoskeletal: Joint pain  Neurological: Negative for neurological symptoms  Psychologic: Negative for psychiatric symptoms

## 2020-11-04 NOTE — Addendum Note (Signed)
Addended byIris Pert on: 11/04/2020 03:41 PM   Modules accepted: Orders

## 2020-11-04 NOTE — Progress Notes (Signed)
   11/04/20  CC: Followup bladder cancer   HPI: Briana Ochoa is a 80yo here for followup for T1G3 bladder cancer diagnosed in 2020. No hematuria or dysuria Blood pressure (!) 164/84, pulse 69, temperature 97.8 F (36.6 C), height 5\' 4"  (1.626 m), weight 173 lb 6.4 oz (78.7 kg). NED. A&Ox3.   No respiratory distress   Abd soft, NT, ND Normal external genitalia with patent urethral meatus  Cystoscopy Procedure Note  Patient identification was confirmed, informed consent was obtained, and patient was prepped using Betadine solution.  Lidocaine jelly was administered per urethral meatus.    Procedure: - Flexible cystoscope introduced, without any difficulty.   - Thorough search of the bladder revealed:    normal urethral meatus    normal urothelium    no stones    no ulcers     no tumors    no urethral polyps    no trabeculation  - Ureteral orifices were normal in position and appearance.  Post-Procedure: - Patient tolerated the procedure well  Assessment/ Plan: RTC 6 months for cystoscopy   No follow-ups on file.  Nicolette Bang, MD

## 2020-11-04 NOTE — Patient Instructions (Signed)
Bladder Cancer Bladder cancer is a condition in which abnormal tissue (a tumor) grows in the bladder. The bladder is the organ that holds urine. Two tubes (ureters) carry the urine from the kidneys to the bladder. The bladder wall is made of layers of tissue. Cancer that spreads through these layers of the bladder wall becomes more difficult to treat. What are the causes? The cause of this condition is not known. What increases the risk? The following factors may make you more likely to develop this condition: Smoking. Working where there are risks (occupational exposures), such as working with rubber, leather, clothing fabric, dyes, chemicals, and paint. Being 55 years of age or older. Being female. Having bladder inflammation that is long-term (chronic). Having a history of cancer, including: A family history of bladder cancer. Personal experience with bladder cancer. Having had certain treatments for cancer before. These include: Medicines to kill cancer cells (chemotherapy). Strong X-ray beams or capsules high in energy to kill cancer cells and shrink tumors (radiation therapy). Having been exposed to arsenic. This is a chemical element that can poison you. What are the signs or symptoms? Early symptoms of this condition include: Seeing blood in your urine. Feeling pain when urinating. Having infections of your urinary system (urinary tract infections or UTIs) that happen often. Having to urinate sooner or more often than usual. Later symptoms of this condition include: Not being able to urinate. Pain on one side of your lower back. Loss of appetite. Weight loss. Tiredness (fatigue). Swelling in your feet. Bone pain. How is this diagnosed? This condition is diagnosed based on: Your medical history. A physical exam. Lab tests, such as urine tests. Imaging tests. Your symptoms. You may also have other tests or procedures done, such as: A cystoscopy. A narrow tube is inserted  into your bladder through the organ that connects your bladder to the outside of your body (urethra). This is done to view the lining of your bladder for tumors. A biopsy. This procedure involves removing a tissue sample to look at it under a microscope to see if cancer is present. It is important to find out: How deeply into the bladder wall cancer has grown. Whether cancer has spread to any other parts of your body. This may require blood tests or imaging tests, such as a CT scan, MRI, bone scan, or X-rays. How is this treated? Your health care provider may recommend one or more types of treatment based on the stage of your cancer. The most common types of treatment are: Surgery to remove the cancer. Procedures that may be done include: Removing a tumor on the inside wall of the bladder (transurethral resection). Removing the bladder (cystectomy). Radiation therapy. This is often used together with chemotherapy. Chemotherapy. Immunotherapy. This uses medicines to help your immune system destroy cancer cells. Follow these instructions at home: Take over-the-counter and prescription medicines only as told by your health care provider. Eat a healthy diet. Some of your treatments might affect your appetite. Do not use any products that contain nicotine or tobacco, such as cigarettes, e-cigarettes, and chewing tobacco. If you need help quitting, ask your health care provider. Consider joining a support group. This may help you learn to cope with the stress of having bladder cancer. Tell your cancer care team if you develop side effects. Your team may be able to recommend ways to get relief. Keep all follow-up visits as told by your health care provider. This is important. Where to find more information American   Cancer Society: www.cancer.org National Cancer Institute (NCI): www.cancer.gov Contact a health care provider if: You have symptoms of a urinary tract infection. These  include: Fever. Chills. Weakness. Muscle aches. Pain in your abdomen. Urge to urinate that is stronger and happens more often than usual. Burning feeling in the bladder or urethra when you urinate. Get help right away if: There is blood in your urine. You cannot urinate. You have severe pain or other symptoms that do not go away. Summary Bladder cancer is a condition in which tumors grow in the bladder and cause illness. This condition is diagnosed based on your medical history, a physical exam, lab tests, imaging tests, and your symptoms. Your health care provider may recommend one or more types of treatment based on the stage of your cancer. Consider joining a support group. This may help you learn to cope with the stress of having bladder cancer. This information is not intended to replace advice given to you by your health care provider. Make sure you discuss any questions you have with your health care provider. Document Revised: 10/03/2018 Document Reviewed: 10/03/2018 Elsevier Patient Education  2022 [employer]  

## 2020-11-06 ENCOUNTER — Other Ambulatory Visit (HOSPITAL_COMMUNITY): Payer: Self-pay

## 2020-11-13 ENCOUNTER — Inpatient Hospital Stay: Payer: Medicare PPO

## 2020-11-13 ENCOUNTER — Other Ambulatory Visit (HOSPITAL_COMMUNITY): Payer: Self-pay

## 2020-11-13 ENCOUNTER — Other Ambulatory Visit: Payer: Self-pay

## 2020-11-13 ENCOUNTER — Inpatient Hospital Stay: Payer: Medicare PPO | Attending: Hematology

## 2020-11-13 ENCOUNTER — Encounter: Payer: Self-pay | Admitting: Hematology

## 2020-11-13 ENCOUNTER — Inpatient Hospital Stay: Payer: Medicare PPO | Admitting: Hematology

## 2020-11-13 VITALS — BP 153/66 | HR 77 | Temp 97.7°F | Resp 20 | Ht 64.0 in | Wt 173.4 lb

## 2020-11-13 DIAGNOSIS — Z5112 Encounter for antineoplastic immunotherapy: Secondary | ICD-10-CM | POA: Diagnosis not present

## 2020-11-13 DIAGNOSIS — C185 Malignant neoplasm of splenic flexure: Secondary | ICD-10-CM | POA: Diagnosis not present

## 2020-11-13 DIAGNOSIS — C186 Malignant neoplasm of descending colon: Secondary | ICD-10-CM

## 2020-11-13 LAB — CMP (CANCER CENTER ONLY)
ALT: 10 U/L (ref 0–44)
AST: 14 U/L — ABNORMAL LOW (ref 15–41)
Albumin: 3.8 g/dL (ref 3.5–5.0)
Alkaline Phosphatase: 104 U/L (ref 38–126)
Anion gap: 9 (ref 5–15)
BUN: 29 mg/dL — ABNORMAL HIGH (ref 8–23)
CO2: 24 mmol/L (ref 22–32)
Calcium: 9.7 mg/dL (ref 8.9–10.3)
Chloride: 107 mmol/L (ref 98–111)
Creatinine: 1.45 mg/dL — ABNORMAL HIGH (ref 0.44–1.00)
GFR, Estimated: 36 mL/min — ABNORMAL LOW (ref >60)
Glucose, Bld: 103 mg/dL — ABNORMAL HIGH (ref 70–99)
Potassium: 4.6 mmol/L (ref 3.5–5.1)
Sodium: 140 mmol/L (ref 135–145)
Total Bilirubin: 0.3 mg/dL (ref 0.3–1.2)
Total Protein: 7.4 g/dL (ref 6.5–8.1)

## 2020-11-13 LAB — CBC WITH DIFFERENTIAL (CANCER CENTER ONLY)
Abs Immature Granulocytes: 0.04 10*3/uL (ref 0.00–0.07)
Basophils Absolute: 0.1 10*3/uL (ref 0.0–0.1)
Basophils Relative: 1 %
Eosinophils Absolute: 0.5 10*3/uL (ref 0.0–0.5)
Eosinophils Relative: 5 %
HCT: 45.4 % (ref 36.0–46.0)
Hemoglobin: 14.7 g/dL (ref 12.0–15.0)
Immature Granulocytes: 0 %
Lymphocytes Relative: 16 %
Lymphs Abs: 1.7 10*3/uL (ref 0.7–4.0)
MCH: 30.6 pg (ref 26.0–34.0)
MCHC: 32.4 g/dL (ref 30.0–36.0)
MCV: 94.6 fL (ref 80.0–100.0)
Monocytes Absolute: 1.2 10*3/uL — ABNORMAL HIGH (ref 0.1–1.0)
Monocytes Relative: 11 %
Neutro Abs: 7.1 10*3/uL (ref 1.7–7.7)
Neutrophils Relative %: 67 %
Platelet Count: 341 10*3/uL (ref 150–400)
RBC: 4.8 MIL/uL (ref 3.87–5.11)
RDW: 18.4 % — ABNORMAL HIGH (ref 11.5–15.5)
WBC Count: 10.7 10*3/uL — ABNORMAL HIGH (ref 4.0–10.5)
nRBC: 0 % (ref 0.0–0.2)

## 2020-11-13 MED ORDER — CAPECITABINE 500 MG PO TABS
ORAL_TABLET | ORAL | 0 refills | Status: DC
  Filled 2020-11-13: qty 70, fill #0
  Filled 2020-11-27: qty 70, 21d supply, fill #0

## 2020-11-13 MED ORDER — SODIUM CHLORIDE 0.9 % IV SOLN
200.0000 mg | Freq: Once | INTRAVENOUS | Status: AC
  Administered 2020-11-13: 200 mg via INTRAVENOUS
  Filled 2020-11-13: qty 8

## 2020-11-13 MED ORDER — SODIUM CHLORIDE 0.9 % IV SOLN: Freq: Once | INTRAVENOUS | Status: AC

## 2020-11-13 NOTE — Progress Notes (Signed)
[place]   Telephone:([phone]) [phone] Fax:(336) [phone]   Clinic Follow up Note   Patient Care Team: Nche, [name], NP as PCP - General (Internal Medicine) Fanny Skates, MD as Consulting Physician (General Surgery) Thea Silversmith, MD as Consulting Physician (Radiation Oncology) Nicholas Lose, MD as Consulting Physician (Hematology and Oncology) Garvin Fila, MD as Consulting Physician (Neurology) Briana Lose, MD as Consulting Physician (Neurosurgery) Katy Apo, MD as Consulting Physician (Ophthalmology) Jonnie Finner, RN (Inactive) as Oncology Nurse Navigator Truitt Merle, MD as Consulting Physician (Hematology and Oncology) Leighton Ruff, MD as Consulting Physician (General Surgery)  Date of Service:  11/13/2020  CHIEF COMPLAINT: f/u of colon cancer  CURRENT THERAPY:  Xeloda (1040m in AM and 15038min PM, 2 weeks on/1 week off) and Keytruda q3weeks, starting 09/21/20, Xeloda held for cycle 2 due to worsening renal function   ASSESSMENT & PLAN:  Briana Ochoa a [age].o. female with   1. Colon cancer in Splenic Flexure, pT3N0M0, Stage II, MSI-High -she was diagnosed on 04/23/20 by Colonoscopy  -She underwent surgical resection with Dr ThMarcello Mooresn 05/28/20. Surgical pathology showed 9.7cm mass in splenic flexure with invasive adenocarcinoma, poorly differentiated was completely removed, negative margins, negative LNs, Stage II. She has intermediate risk of recurrence.  -GuardantReveal on 07/07/20 was positive, which likely indicating microscopic disease and high risk of recurrence. -PET scan was recommended but denied by her insurance. She proceeded with CT CAP w/o contrast on 08/31/20 showing no specific findings of active malignancy.  -She began Xeloda and Keytruda on 09/21/20. She takes Xeloda 100013mn AM and 1500m48m PM, 14 days on/7 days off, and she receives [place] 3 weeks. Plan to give Xeloda 3 to 6 months depending on  her tolerance, and Keytruda 1-2 years if repeat scan showed no evidence of recurrence  -She has tolerated treatment well overall. She was found to have increased Cr of 1.7 on 10/23/20, and we held her Xeloda. -Lab reviewed, her creatinine has improved to 1.45 today. She will start cycle 3 Xeloda this afternoon at the same dose. We will continue Keytruda every 3 weeks. -plan for restaging scan in 02/2021.   2. H/o DVT 04/2020 -she was found to have an elevated D-dimer back on 04/08/20. She was found to have DVT in the RLE. This was still present on 04/28/20 and she was started on Eliquis shortly after. -I discussed that her chemo can increase her risk of blood clots. She notes the Eliquis is expensive. I advised her to switch to baby aspirin.   3. Anemia of iron deficiency  -Secondary to #1 with GI blood loss -She required Blood transfusion and IV Iron. -resolved following surgery 05/2020   4. H/o of Left breast DCIS, H/o Bladder cancer, H/o Melanoma in 2017 of right lower leg -Diagnosed with DCIS in 2015, S/p left lumpectomy, Adjuvant Radiation and 5 years of Anastrozole. Treated by Dr GudeLindi Adieiagnosed with bladder cancer in 02/2018, early stage, S/p surgery. Followed by Dr. McKeAlyson Inglesxt f/u on 10/28/20. -She will continue surveillance.   5. Genetic Testing -Given her personably history of cancer and family history of cancer, I recommend genetic testing.  -performed 07/07/20, results negative   6. Comorbidites: CKD, DM, Heart murmur, H/o mini stroke from Brain aneurysm, HTN, Osteopenia -Continue medications and to f/u with other physicians for management.      PLAN:  -proceed to [place] -start C3 Xeloda this afternoon at same dose -labs, f/u, and treatment  in 3 and 6 weeks   No problem-specific Assessment & Plan notes found for this encounter.   SUMMARY OF ONCOLOGIC HISTORY: Oncology History Overview Note  Cancer Staging Cancer of left colon [facility]) Staging form: Colon and  Rectum, AJCC 8th Edition - Pathologic stage from 05/28/2020: Stage IIA (pT3, pN0, cM0) - Signed by Truitt Merle, MD on 06/19/2020 Stage prefix: Initial diagnosis Total positive nodes: 0 Histologic grading system: 4 grade system Histologic grade (G): G3 Residual tumor (R): R0 - None  Ductal carcinoma in situ (DCIS) of left breast Staging form: Breast, AJCC 7th Edition - Clinical: Stage 0 (Tis (DCIS), N0, cM0) - Unsigned Specimen type: Core Needle Biopsy Histopathologic type: 9932 Laterality: Left Staging comments: Staged at breast conference 7.22.15  - Pathologic: Stage 0 (Tis (DCIS)(2), N0, cM0) - Unsigned Specimen type: Core Needle Biopsy Histopathologic type: 9932 Laterality: Left Tumor size (mm): 3 Multiple tumors: Yes Number of tumors: 2 Method of lymph node assessment: Clinical Method of detection of distant metastases: Clinical Residual tumor (R): R0 - None Estrogen receptor status: Positive Progesterone receptor status: Positive    Ductal carcinoma in situ (DCIS) of left breast  08/20/2013 Initial Diagnosis   Left breast 12:00: DCIS with calcifications, yet 100%, PR 100%; 09/04/2013 second group of calcifications also biopsied proven to be DCIS ER/PR positive   09/19/2013 Surgery   Left breast lumpectomy DCIS 2 foci margins negative, 0.2 and 0.3 cm ER 100% PR 100%   10/08/2013 - 11/12/2013 Radiation Therapy   Adjuvant radiation therapy   11/12/2013 - 2020 Anti-estrogen oral therapy   Anastrozole 1 mg daily plan is for 5 years   01/09/2014 - 01/10/2014 Hospital Admission   Pipeline embolization 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 The report date is 07/30/2020.   The CancerNext-Expanded + RNAinsight gene panel offered by [employer] and includes sequencing and rearrangement analysis for the following 77 genes: AIP, ALK, APC, ATM, AXIN2, BAP1,  BARD1, BLM, BMPR1A, BRCA1, BRCA2, BRIP1, CDC73, CDH1, CDK4, CDKN1B, CDKN2A, CHEK2, CTNNA1, DICER1, FANCC, FH, FLCN, GALNT12, KIF1B, LZTR1, MAX, MEN1, MET, MLH1, MSH2, MSH3, MSH6, MUTYH, NBN, NF1, NF2, NTHL1, PALB2, PHOX2B, PMS2, POT1, PRKAR1A, PTCH1, PTEN, RAD51C, RAD51D, RB1, RECQL, RET, SDHA, SDHAF2, SDHB, SDHC, SDHD, SMAD4, SMARCA4, SMARCB1, SMARCE1, STK11, SUFU, TMEM127, TP53, TSC1, TSC2, VHL and XRCC2 (sequencing and deletion/duplication); EGFR, EGLN1, HOXB13, KIT, MITF, PDGFRA, POLD1 and POLE (sequencing only); EPCAM and GREM1 (deletion/duplication only). RNA data is routinely analyzed for use in variant interpretation for all genes.   Bladder cancer ([place])  02/09/2018 Initial Diagnosis   Bladder cancer ([place])   07/30/2020 Genetic Testing   Negative genetic testing:  No pathogenic variants detected on the [employer] CancerNext-Expanded + RNAinsight panel. The report date is 07/30/2020.   The CancerNext-Expanded + RNAinsight gene panel offered by [employer] and includes sequencing and rearrangement analysis for the following 77 genes: AIP, ALK, APC, ATM, AXIN2, BAP1, BARD1, BLM, BMPR1A, BRCA1, BRCA2, BRIP1, CDC73, CDH1, CDK4, CDKN1B, CDKN2A, CHEK2, CTNNA1, DICER1, FANCC, FH, FLCN, GALNT12, KIF1B, LZTR1, MAX, MEN1, MET, MLH1, MSH2, MSH3, MSH6, MUTYH, NBN, NF1, NF2, NTHL1, PALB2, PHOX2B, PMS2, POT1, PRKAR1A, PTCH1, PTEN, RAD51C, RAD51D, RB1, RECQL, RET, SDHA, SDHAF2, SDHB, SDHC, SDHD, SMAD4, SMARCA4, SMARCB1, SMARCE1, STK11, SUFU, TMEM127, TP53, TSC1, TSC2, VHL and XRCC2 (sequencing and deletion/duplication); EGFR, EGLN1, HOXB13, KIT, MITF, PDGFRA, POLD1 and POLE (sequencing only); EPCAM and GREM1 (deletion/duplication only). RNA data  is routinely analyzed for use in variant interpretation for all genes.   08/31/2020 Imaging   CT CAP w/o contrast  IMPRESSION: 1. No specific findings of active malignancy on today's noncontrast CT. There has been interval partial colectomy involving the tumor site with  reanastomosis. Some faint stranding in the adjacent omentum is probably from scarring or fat necrosis, less likely due to early tumor given the lack of overt nodularity. Surveillance imaging is likely indicated, and PET-CT may have a role in imaging follow up. 2. Several small pulmonary nodules, in addition to a larger mixed density nodule in the left upper lobe. Surveillance is recommended. 3. Other imaging findings of potential clinical significance: Aortic Atherosclerosis (ICD10-I70.0). Coronary atherosclerosis. Mitral valve calcification. Small type 1 hiatal hernia. Splenectomy with small amount of regenerative splenic tissue as well as a venous varix arising from the splenic vein. Hyperdense left kidney lower pole exophytic homogeneous lesion, probably a complex cyst. Sigmoid colon diverticulosis. Multilevel lumbar spondylosis and degenerative disc disease.   Cancer of left colon ([place])  04/23/2020 Procedure   Upper Endoscopy and Colonoscopy by Dr Carlean Purl IMPRESSION Erythematous mucosa in the antrum. Biopsied. - Small hiatal hernia. - Gastroesophageal flap valve classified as Hill Grade IV (no fold, wide open lumen, hiatal hernia present). - The examination was otherwise normal.   IMPRESSION - Decreased sphincter tone found on digital rectal exam. - Malignant partially obstructing tumor in the proximal descending colon. Biopsied. Tattooed. - Three 5 to 20 mm polyps in the sigmoid colon, removed piecemeal using a cold snare. Resected and retrieved. - Severe diverticulosis in the sigmoid colon. - I left several diminutive descending (proximal to tattoo), sigmoid and rectal polyps - The examination was otherwise normal on direct and retroflexion views.     04/23/2020 Initial Diagnosis   Diagnosis 1. Descending Colon Polyp, mass - ADENOCARCINOMA. 2. Sigmoid Colon Polyp, (3) - MULTIPLE FRAGMENTS OF TUBULAR ADENOMA. - MULTIPLE FRAGMENTS OF SESSILE SERRATED POLYP WITHOUT  DYSPLASIA. - NO HIGH GRADE DYSPLASIA OR MALIGNANCY. Microscopic Comment 1. Dr. Vic Ripper has reviewed the case. Dr. Carlean Purl was notified on 04/24/2020. 2. Despite being left sided the fragments have features of a sessile serrated polyp, as opposed to, hyperplastic polyp.   04/27/2020 Imaging   CT AP IMPRESSION: Bulky annular soft tissue mass involving the distal transverse colon with extension into adjacent pericolonic fat, consistent with primary colon carcinoma.   No evidence of metastatic disease.   Colonic diverticulosis. No radiographic evidence of diverticulitis.   Aortic Atherosclerosis (ICD10-I70.0).     05/05/2020 Imaging   CT Chest IMPRESSION: Bulky annular soft tissue mass involving the distal transverse colon with extension into adjacent pericolonic fat, consistent with primary colon carcinoma.   No evidence of metastatic disease.   Colonic diverticulosis. No radiographic evidence of diverticulitis.   Aortic Atherosclerosis (ICD10-I70.0).     05/28/2020 Initial Diagnosis   Cancer of left colon ([place])   05/28/2020 Cancer Staging   Staging form: Colon and Rectum, AJCC 8th Edition - Pathologic stage from 05/28/2020: Stage IIA (pT3, pN0, cM0) - Signed by Truitt Merle, MD on 06/19/2020 Stage prefix: Initial diagnosis Total positive nodes: 0 Histologic grading system: 4 grade system Histologic grade (G): G3 Residual tumor (R): R0 - None   05/28/2020 Surgery   XI ROBOT ASSISTED RESECTION OF SPLENIC FLEXURE by Dr Marcello Moores    FINAL MICROSCOPIC DIAGNOSIS:   A. COLON, SPLENIC FLEXURE, RESECTION:  - Invasive adenocarcinoma, poorly differentiated, spanning 9.7 cm.  - Tumor invades through muscularis propria into pericolonic  tissue.  - Resection margins are negative.  - Tubular adenoma (X1).  - Sessile serrate polyp without dysplasia (x2).  - Diverticulosis.  - Polypectomy scar.  - Twenty-four of twenty-four lymph nodes negative for carcinoma (0/24).  - See oncology table.     07/07/2020 Miscellaneous   Guardant Reveal ctDNA positive MSI high   07/30/2020 Genetic Testing   Negative genetic testing:  No pathogenic variants detected on the [employer] CancerNext-Expanded + RNAinsight panel. The report date is 07/30/2020.   The CancerNext-Expanded + RNAinsight gene panel offered by [employer] and includes sequencing and rearrangement analysis for the following 77 genes: AIP, ALK, APC, ATM, AXIN2, BAP1, BARD1, BLM, BMPR1A, BRCA1, BRCA2, BRIP1, CDC73, CDH1, CDK4, CDKN1B, CDKN2A, CHEK2, CTNNA1, DICER1, FANCC, FH, FLCN, GALNT12, KIF1B, LZTR1, MAX, MEN1, MET, MLH1, MSH2, MSH3, MSH6, MUTYH, NBN, NF1, NF2, NTHL1, PALB2, PHOX2B, PMS2, POT1, PRKAR1A, PTCH1, PTEN, RAD51C, RAD51D, RB1, RECQL, RET, SDHA, SDHAF2, SDHB, SDHC, SDHD, SMAD4, SMARCA4, SMARCB1, SMARCE1, STK11, SUFU, TMEM127, TP53, TSC1, TSC2, VHL and XRCC2 (sequencing and deletion/duplication); EGFR, EGLN1, HOXB13, KIT, MITF, PDGFRA, POLD1 and POLE (sequencing only); EPCAM and GREM1 (deletion/duplication only). RNA data is routinely analyzed for use in variant interpretation for all genes.   08/31/2020 Imaging   CT CAP w/o contrast  IMPRESSION: 1. No specific findings of active malignancy on today's noncontrast CT. There has been interval partial colectomy involving the tumor site with reanastomosis. Some faint stranding in the adjacent omentum is probably from scarring or fat necrosis, less likely due to early tumor given the lack of overt nodularity. Surveillance imaging is likely indicated, and PET-CT may have a role in imaging follow up. 2. Several small pulmonary nodules, in addition to a larger mixed density nodule in the left upper lobe. Surveillance is recommended. 3. Other imaging findings of potential clinical significance: Aortic Atherosclerosis (ICD10-I70.0). Coronary atherosclerosis. Mitral valve calcification. Small type 1 hiatal hernia. Splenectomy with small amount of regenerative splenic tissue as well  as a venous varix arising from the splenic vein. Hyperdense left kidney lower pole exophytic homogeneous lesion, probably a complex cyst. Sigmoid colon diverticulosis. Multilevel lumbar spondylosis and degenerative disc disease.   10/02/2020 -  Chemotherapy   Patient is on Treatment Plan : COLORECTAL Pembrolizumab q21d        INTERVAL HISTORY:  Briana Ochoa is here for a follow up of colon cancer. She was last seen by me on 10/23/20. She presents to the clinic alone. She reports she is feeling well overall, denies side effects from treatment. She notes she worked about 25 hours this past week.   All other systems were reviewed with the patient and are negative.  MEDICAL HISTORY:  Past Medical History:  Diagnosis Date   Allergy    seasonal allergies   Anemia    on meds   Aortic atherosclerosis (HCC)    Bladder cancer ([place]) 2020   Bladder tumor    Bloody diarrhea 09/29/2017   Breast cancer ([place]) 2015   Left Breast Cancer   Cataract    bilateral -sx   CKD (chronic kidney disease), stage III (HCC)    DM related   Colon cancer ([place])    Diabetes mellitus type 2, diet-controlled ([place])    diet controlled   Dyspnea 09/28/2017   Dysuria    Family history of adverse reaction to anesthesia    aunt- N/V    Family history of breast cancer    Family history of kidney cancer    Family history of throat  cancer    Family history of uterine cancer    Fatigue 09/28/2017   Frequency of urination    Grade I diastolic dysfunction 63/33/5456   Noted on ECHO   Gross hematuria 09/29/2017   Heart murmur    since rheumatic fever as child   Hematuria    History of cardiomegaly 02/12/2005   Mild, noted on CXR   History of CVA (cerebrovascular accident) 08/05/2014   post op cerebral angiogram, cerebral thrombosis w/ cerebral infarction;  11-02-2017  per pt no residuals   History of malignant melanoma of skin 12/2015   s/p wide local excision right lower leg (per pt localized)    History of rheumatic fever as a child    Hyperlipidemia    on meds   Hypertension    on meds   Intracranial aneurysm dx 07/ 2015   paraophthalmic RICA aneurysm /   s/p  Pipeline embolization right ICA 01-09-2014   Jaundice 09/28/2017   Malignant neoplasm of central portion of left breast in female, estrogen receptor positive [facility]) oncologist-  dr Lindi Adie   dx 07/ 2015--- DCIS, ER/PR positive-- s/p  left breast lumpectomy 09-19-2013,  completed radiation 11-12-2013,  started arimidex 11-12-2013   Melanoma ([place])    righ tleg    OA (osteoarthritis)    knees, hands, R shoulder   Osteopenia 10/16/2012   Diffuse   Personal history of radiation therapy    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   Sigmoid diverticulosis    Weakness 09/28/2017   Wears dentures    upper   Wears glasses    Wears hearing aid in both ears     SURGICAL HISTORY: Past Surgical History:  Procedure Laterality Date   BREAST LUMPECTOMY Left 2015   BREAST LUMPECTOMY WITH NEEDLE LOCALIZATION Left 09/19/2013   Procedure: LEFT BREAST LUMPECTOMY WITH DOUBLE WIRE BRACKETED  NEEDLE LOCALIZATION;  Surgeon: [name], MD;  Location: [place];  Service: General;  Laterality: Left;   CATARACT EXTRACTION W/ INTRAOCULAR LENS  IMPLANT, BILATERAL  06/2015   CESAREAN SECTION  1964   CHOLECYSTECTOMY OPEN  1970s   CYSTOSCOPY W/ URETERAL STENT PLACEMENT Left 11/07/2017   Procedure: CYSTOSCOPY WITH RETROGRADE PYELOGRAM/URETERAL STENT PLACEMENT;  Surgeon: Cleon Gustin, MD;  Location[facility];  Service: Urology;  Laterality: Left;   CYSTOSCOPY W/ URETERAL STENT PLACEMENT Bilateral 04/27/2018   Procedure: CYSTOSCOPY WITH RETROGRADE PYELOGRAM;  Surgeon: Cleon Gustin, MD;  Location[facility];  Service: Urology;  Laterality: Bilateral;   FINGER SURGERY     lt thumb  from dog bite   IR  NEPHROURETERAL CATH PLACE LEFT  04/28/2018   MELANOMA EXCISION Right 12/28/2015   Procedure:  EXCISION MELANOMA RIGHT LOWER LEG;  Surgeon: Fanny Skates, MD;  Location: [place];  Service: General;  Laterality: Right;   RADIOLOGY WITH ANESTHESIA N/A 01/09/2014   Procedure: Embolization;  Surgeon: Briana Lose, MD;  Location: [place];  Service: Radiology;  Laterality: N/A;   SPLENECTOMY, TOTAL  2009 approx.   splen injury due to fall off horse   TONSILLECTOMY  child   TRANSURETHRAL RESECTION OF BLADDER TUMOR N/A 11/07/2017   Procedure: TRANSURETHRAL RESECTION OF BLADDER TUMOR (TURBT), POSSIBLE STENT PLACEMENT;  Surgeon: Cleon Gustin, MD;  Location[facility];  Service: Urology;  Laterality: N/A;   TRANSURETHRAL RESECTION OF BLADDER TUMOR N/A 12/14/2017   Procedure: TRANSURETHRAL RESECTION OF BLADDER TUMOR (TURBT);  Surgeon: Nicolette Bang  L, MD;  Location: [place];  Service: Urology;  Laterality: N/A;  [place] TUMOR N/A 04/27/2018   Procedure: TRANSURETHRAL RESECTION OF BLADDER TUMOR (TURBT);  Surgeon: Cleon Gustin, MD;  Location[facility];  Service: Urology;  Laterality: N/A;  35 MINS    I have reviewed the social history and family history with the patient and they are unchanged from previous note.  ALLERGIES:  is allergic to feraheme [ferumoxytol].  MEDICATIONS:  Current Outpatient Medications  Medication Sig Dispense Refill   acetaminophen (TYLENOL) 500 MG tablet Take 1,000 mg by mouth daily.     capecitabine (XELODA) 500 MG tablet Take 2 tablets (1000 mg total) by mouth in morning and 3 tablets (1500 mg total) by mouth in evening. Take within 30 minutes after meals. Take for 14 days on and 7 days off. Repeat every 21 days. 70 tablet 0   glucose blood (TRUE METRIX BLOOD GLUCOSE TEST) test strip Use as instructed to test blood sugar once daily E08.638 100 each 2   lisinopril (ZESTRIL) 20 MG tablet TAKE 1 TABLET EVERY DAY 90 tablet 3   ondansetron (ZOFRAN) 8  MG tablet Take 1 tablet (8 mg total) by mouth every 8 (eight) hours as needed for nausea or vomiting. 20 tablet 0   pravastatin (PRAVACHOL) 10 MG tablet TAKE 1 TABLET EVERY DAY (STOP PRAVASTATIN 20MG) 90 tablet 3   No current facility-administered medications for this visit.    PHYSICAL EXAMINATION: ECOG PERFORMANCE STATUS: 1 - Symptomatic but completely ambulatory  Vitals:   11/13/20 1209  BP: (!) 153/66  Pulse: 77  Resp: 20  Temp: 97.7 F (36.5 C)  SpO2: 95%   Wt Readings from Last 3 Encounters:  11/13/20 173 lb 6.4 oz (78.7 kg)  11/04/20 173 lb 6.4 oz (78.7 kg)  11/02/20 173 lb 9.6 oz (78.7 kg)     GENERAL:alert, no distress and comfortable SKIN: skin color normal, no rashes or significant lesions EYES: normal, Conjunctiva are pink and non-injected, sclera clear  NEURO: alert & oriented x 3 with fluent speech  LABORATORY DATA:  I have reviewed the data as listed CBC Latest Ref Rng & Units 11/13/2020 10/27/2020 10/23/2020  WBC 4.0 - 10.5 K/uL 10.7(H) 9.2 9.3  Hemoglobin 12.0 - 15.0 g/dL 14.7 13.1 13.6  Hematocrit 36.0 - 46.0 % 45.4 39.7 41.0  Platelets 150 - 400 K/uL 341 283 299     CMP Latest Ref Rng & Units 11/13/2020 10/27/2020 10/23/2020  Glucose 70 - 99 mg/dL 103(H) 158(H) 103(H)  BUN 8 - 23 mg/dL 29(H) 29(H) 27(H)  Creatinine 0.44 - 1.00 mg/dL 1.45(H) 1.78(H) 1.73(H)  Sodium 135 - 145 mmol/L 140 141 140  Potassium 3.5 - 5.1 mmol/L 4.6 4.4 4.7  Chloride 98 - 111 mmol/L 107 107 108  CO2 22 - 32 mmol/L 24 25 23   Calcium 8.9 - 10.3 mg/dL 9.7 9.5 9.5  Total Protein 6.5 - 8.1 g/dL 7.4 6.8 6.9  Total Bilirubin 0.3 - 1.2 mg/dL 0.3 0.4 0.3  Alkaline Phos 38 - 126 U/L 104 96 102  AST 15 - 41 U/L 14(L) 14(L) 17  ALT 0 - 44 U/L 10 11 10       RADIOGRAPHIC STUDIES: I have personally reviewed the radiological images as listed and agreed with the findings in the report. No results found.    No orders of the defined types were placed in this encounter.  All questions  were answered.  The patient knows to call the clinic with any problems, questions or concerns. No barriers to learning was detected. The total time spent in the appointment was 30 minutes.     Truitt Merle, MD 11/13/2020   I, Wilburn Mylar, am acting as scribe for Truitt Merle, MD.   I have reviewed the above documentation for accuracy and completeness, and I agree with the above.

## 2020-11-13 NOTE — Patient Instructions (Signed)
[facility]  Discharge Instructions: Thank you for choosing [facility] to provide your oncology and hematology care.   If you have a lab appointment with the Cancer Center, please go directly to the Cancer Center and check in at the registration area.   Wear comfortable clothing and clothing appropriate for easy access to any Portacath or PICC line.   We strive to give you quality time with your provider. You may need to reschedule your appointment if you arrive late (15 or more minutes).  Arriving late affects you and other patients whose appointments are after yours.  Also, if you miss three or more appointments without notifying the office, you may be dismissed from the clinic at the provider's discretion.      For prescription refill requests, have your pharmacy contact our office and allow 72 hours for refills to be completed.    Today you received the following chemotherapy and/or immunotherapy agents: keytruda      To help prevent nausea and vomiting after your treatment, we encourage you to take your nausea medication as directed.  BELOW ARE SYMPTOMS THAT SHOULD BE REPORTED IMMEDIATELY: *FEVER GREATER THAN 100.4 F (38 C) OR HIGHER *CHILLS OR SWEATING *NAUSEA AND VOMITING THAT IS NOT CONTROLLED WITH YOUR NAUSEA MEDICATION *UNUSUAL SHORTNESS OF BREATH *UNUSUAL BRUISING OR BLEEDING *URINARY PROBLEMS (pain or burning when urinating, or frequent urination) *BOWEL PROBLEMS (unusual diarrhea, constipation, pain near the anus) TENDERNESS IN MOUTH AND THROAT WITH OR WITHOUT PRESENCE OF ULCERS (sore throat, sores in mouth, or a toothache) UNUSUAL RASH, SWELLING OR PAIN  UNUSUAL VAGINAL DISCHARGE OR ITCHING   Items with * indicate a potential emergency and should be followed up as soon as possible or go to the Emergency Department if any problems should occur.  Please show the CHEMOTHERAPY ALERT CARD or IMMUNOTHERAPY ALERT CARD at check-in to  the Emergency Department and triage nurse.  Should you have questions after your visit or need to cancel or reschedule your appointment, please contact [facility]  Dept: [phone]  and follow the prompts.  Office hours are 8:00 a.m. to 4:30 p.m. Monday - Friday. Please note that voicemails left after 4:00 p.m. may not be returned until the following business day.  We are closed weekends and major holidays. You have access to a nurse at all times for urgent questions. Please call the main number to the clinic Dept: [phone] and follow the prompts.   For any non-urgent questions, you may also contact your provider using MyChart. We now offer e-Visits for anyone 18 and older to request care online for non-urgent symptoms. For details visit [email or web address].   Also download the MyChart app! Go to the app store, search "MyChart", open the app, select [place], and log in with your MyChart username and password.  Due to Covid, a mask is required upon entering the hospital/clinic. If you do not have a mask, one will be given to you upon arrival. For doctor visits, patients may have 1 support person aged 18 or older with them. For treatment visits, patients cannot have anyone with them due to current Covid guidelines and our immunocompromised population.   

## 2020-11-16 ENCOUNTER — Telehealth: Payer: Self-pay | Admitting: Hematology

## 2020-11-16 NOTE — Telephone Encounter (Signed)
Scheduled follow-up appointments per 10/7 los. Patient is aware.

## 2020-11-24 ENCOUNTER — Other Ambulatory Visit (HOSPITAL_COMMUNITY): Payer: Self-pay

## 2020-11-26 ENCOUNTER — Other Ambulatory Visit (HOSPITAL_COMMUNITY): Payer: Self-pay

## 2020-11-27 ENCOUNTER — Other Ambulatory Visit (HOSPITAL_COMMUNITY): Payer: Self-pay

## 2020-11-30 ENCOUNTER — Encounter: Payer: Self-pay | Admitting: Nurse Practitioner

## 2020-11-30 DIAGNOSIS — Z961 Presence of intraocular lens: Secondary | ICD-10-CM | POA: Diagnosis not present

## 2020-11-30 DIAGNOSIS — E113291 Type 2 diabetes mellitus with mild nonproliferative diabetic retinopathy without macular edema, right eye: Secondary | ICD-10-CM | POA: Diagnosis not present

## 2020-11-30 DIAGNOSIS — H52203 Unspecified astigmatism, bilateral: Secondary | ICD-10-CM | POA: Diagnosis not present

## 2020-11-30 DIAGNOSIS — H35371 Puckering of macula, right eye: Secondary | ICD-10-CM | POA: Diagnosis not present

## 2020-11-30 LAB — HM DIABETES EYE EXAM

## 2020-12-02 ENCOUNTER — Other Ambulatory Visit (HOSPITAL_COMMUNITY): Payer: Self-pay

## 2020-12-03 ENCOUNTER — Encounter: Payer: Self-pay | Admitting: Nurse Practitioner

## 2020-12-03 DIAGNOSIS — E11319 Type 2 diabetes mellitus with unspecified diabetic retinopathy without macular edema: Secondary | ICD-10-CM | POA: Insufficient documentation

## 2020-12-04 ENCOUNTER — Inpatient Hospital Stay: Payer: Medicare PPO

## 2020-12-04 ENCOUNTER — Encounter: Payer: Self-pay | Admitting: Hematology

## 2020-12-04 ENCOUNTER — Inpatient Hospital Stay: Payer: Medicare PPO | Admitting: Hematology

## 2020-12-04 ENCOUNTER — Other Ambulatory Visit: Payer: Self-pay

## 2020-12-04 VITALS — BP 132/55 | HR 67 | Temp 98.0°F | Resp 18 | Ht 64.0 in | Wt 175.4 lb

## 2020-12-04 DIAGNOSIS — C186 Malignant neoplasm of descending colon: Secondary | ICD-10-CM

## 2020-12-04 DIAGNOSIS — C185 Malignant neoplasm of splenic flexure: Secondary | ICD-10-CM | POA: Diagnosis not present

## 2020-12-04 DIAGNOSIS — Z5112 Encounter for antineoplastic immunotherapy: Secondary | ICD-10-CM | POA: Diagnosis not present

## 2020-12-04 LAB — CBC WITH DIFFERENTIAL (CANCER CENTER ONLY)
Abs Immature Granulocytes: 0.04 10*3/uL (ref 0.00–0.07)
Basophils Absolute: 0.1 10*3/uL (ref 0.0–0.1)
Basophils Relative: 1 %
Eosinophils Absolute: 0.7 10*3/uL — ABNORMAL HIGH (ref 0.0–0.5)
Eosinophils Relative: 6 %
HCT: 40.8 % (ref 36.0–46.0)
Hemoglobin: 13.7 g/dL (ref 12.0–15.0)
Immature Granulocytes: 0 %
Lymphocytes Relative: 20 %
Lymphs Abs: 2.1 10*3/uL (ref 0.7–4.0)
MCH: 32.2 pg (ref 26.0–34.0)
MCHC: 33.6 g/dL (ref 30.0–36.0)
MCV: 95.8 fL (ref 80.0–100.0)
Monocytes Absolute: 1.1 10*3/uL — ABNORMAL HIGH (ref 0.1–1.0)
Monocytes Relative: 11 %
Neutro Abs: 6.3 10*3/uL (ref 1.7–7.7)
Neutrophils Relative %: 62 %
Platelet Count: 297 10*3/uL (ref 150–400)
RBC: 4.26 MIL/uL (ref 3.87–5.11)
RDW: 19.1 % — ABNORMAL HIGH (ref 11.5–15.5)
WBC Count: 10.2 10*3/uL (ref 4.0–10.5)
nRBC: 0 % (ref 0.0–0.2)

## 2020-12-04 LAB — CMP (CANCER CENTER ONLY)
ALT: 8 U/L (ref 0–44)
AST: 16 U/L (ref 15–41)
Albumin: 3.7 g/dL (ref 3.5–5.0)
Alkaline Phosphatase: 108 U/L (ref 38–126)
Anion gap: 8 (ref 5–15)
BUN: 24 mg/dL — ABNORMAL HIGH (ref 8–23)
CO2: 23 mmol/L (ref 22–32)
Calcium: 9.3 mg/dL (ref 8.9–10.3)
Chloride: 109 mmol/L (ref 98–111)
Creatinine: 1.68 mg/dL — ABNORMAL HIGH (ref 0.44–1.00)
GFR, Estimated: 31 mL/min — ABNORMAL LOW (ref >60)
Glucose, Bld: 135 mg/dL — ABNORMAL HIGH (ref 70–99)
Potassium: 4.5 mmol/L (ref 3.5–5.1)
Sodium: 140 mmol/L (ref 135–145)
Total Bilirubin: 0.4 mg/dL (ref 0.3–1.2)
Total Protein: 7 g/dL (ref 6.5–8.1)

## 2020-12-04 LAB — CEA (IN HOUSE-CHCC): CEA (CHCC-In House): 3.85 ng/mL (ref 0.00–5.00)

## 2020-12-04 MED ORDER — SODIUM CHLORIDE 0.9 % IV SOLN
200.0000 mg | Freq: Once | INTRAVENOUS | Status: AC
  Administered 2020-12-04: 200 mg via INTRAVENOUS
  Filled 2020-12-04: qty 8

## 2020-12-04 MED ORDER — SODIUM CHLORIDE 0.9 % IV SOLN: Freq: Once | INTRAVENOUS | Status: AC

## 2020-12-04 NOTE — Progress Notes (Signed)
[place]   [phone] Fax:([phone]) [phone]   Clinic Follow up Note   Patient Care Team: Nche, [name], NP as PCP - General (Internal Medicine) Fanny Skates, MD as Consulting Physician (General Surgery) Thea Silversmith, MD as Consulting Physician (Radiation Oncology) Nicholas Lose, MD as Consulting Physician (Hematology and Oncology) Garvin Fila, MD as Consulting Physician (Neurology) Consuella Lose, MD as Consulting Physician (Neurosurgery) Katy Apo, MD as Consulting Physician (Ophthalmology) Jonnie Finner, RN (Inactive) as Oncology Nurse Navigator Truitt Merle, MD as Consulting Physician (Hematology and Oncology) Leighton Ruff, MD as Consulting Physician (General Surgery)  Date of Service:  12/04/2020  CHIEF COMPLAINT: f/u of colon cancer  CURRENT THERAPY:  Xeloda (1061m in AM and 15075min PM, 2 weeks on/1 week off) and Keytruda q3weeks, starting 09/21/20, Xeloda held for cycle 2 due to worsening renal function   ASSESSMENT & PLAN:  Briana Ochoa a [age].o. female with   1. Colon cancer in Splenic Flexure, pT3N0M0, Stage II, MSI-High -she was diagnosed on 04/23/20 by Colonoscopy  -She underwent surgical resection with Dr ThMarcello Mooresn 05/28/20. Surgical pathology showed 9.7cm mass in splenic flexure with invasive adenocarcinoma, poorly differentiated was completely removed, negative margins, negative LNs, Stage II. She has intermediate risk of recurrence.  -GuardantReveal on 07/07/20 was positive, which likely indicating microscopic disease and high risk of recurrence. -PET scan was recommended but denied by her insurance. She proceeded with CT CAP w/o contrast on 08/31/20 showing no specific findings of active malignancy.  -She began Xeloda and Keytruda on 09/21/20. She takes Xeloda 100060mn AM and 1500m29m PM, 14 days on/7 days off, and she receives [place] weeks. Plan to give Xeloda 3 to 6 months depending on  her tolerance, and Keytruda 1-2 years if repeat scan showed no evidence of recurrence  -She has tolerated treatment well overall. She was found to have increased Cr of 1.7 on 10/23/20, and we held her Xeloda. Xeloda was restarted on cycle 3, and she developed mild fatigue and taste change  -Lab reviewed,  We will continue Keytruda every 3 weeks. Due to her low EGFR today will hold xeloda again  -plan for restaging scan in 02/2021.   2. H/o DVT 04/2020 -she was found to have an elevated D-dimer back on 04/08/20. She was found to have DVT in the RLE. This was still present on 04/28/20 and she was started on Eliquis shortly after. -I discussed that her chemo can increase her risk of blood clots. She notes the Eliquis is expensive. I advised her to switch to baby aspirin.   3. Anemia of iron deficiency  -Secondary to #1 with GI blood loss -She required Blood transfusion and IV Iron. -resolved following surgery 05/2020   4. H/o of Left breast DCIS, H/o Bladder cancer, H/o Melanoma in 2017 of right lower leg -Diagnosed with DCIS in 2015, S/p left lumpectomy, Adjuvant Radiation and 5 years of Anastrozole. Treated by Dr GudeLindi Adieiagnosed with bladder cancer in 02/2018, early stage, S/p surgery. Followed by Dr. McKeAlyson Inglesxt f/u on 10/28/20. -She will continue surveillance.   5. Genetic Testing -Given her personably history of cancer and family history of cancer, I recommend genetic testing.  -performed 07/07/20, results negative   6. Comorbidites: CKD, DM, Heart murmur, H/o mini stroke from Brain aneurysm, HTN, Osteopenia -Continue medications and to f/u with other physicians for management.      PLAN:  -[place] -due to her low  EGFR, will hold this cycle xeloda  -labs, f/u, and treatment in 3 and 6 weeks   No problem-specific Assessment & Plan notes found for this encounter.   SUMMARY OF ONCOLOGIC HISTORY: Oncology History Overview Note  Cancer Staging Cancer of left colon  [facility]) Staging form: Colon and Rectum, AJCC 8th Edition - Pathologic stage from 05/28/2020: Stage IIA (pT3, pN0, cM0) - Signed by Truitt Merle, MD on 06/19/2020 Stage prefix: Initial diagnosis Total positive nodes: 0 Histologic grading system: 4 grade system Histologic grade (G): G3 Residual tumor (R): R0 - None  Ductal carcinoma in situ (DCIS) of left breast Staging form: Breast, AJCC 7th Edition - Clinical: Stage 0 (Tis (DCIS), N0, cM0) - Unsigned Specimen type: Core Needle Biopsy Histopathologic type: 9932 Laterality: Left Staging comments: Staged at breast conference 7.22.15  - Pathologic: Stage 0 (Tis (DCIS)(2), N0, cM0) - Unsigned Specimen type: Core Needle Biopsy Histopathologic type: 9932 Laterality: Left Tumor size (mm): 3 Multiple tumors: Yes Number of tumors: 2 Method of lymph node assessment: Clinical Method of detection of distant metastases: Clinical Residual tumor (R): R0 - None Estrogen receptor status: Positive Progesterone receptor status: Positive    Ductal carcinoma in situ (DCIS) of left breast  08/20/2013 Initial Diagnosis   Left breast 12:00: DCIS with calcifications, yet 100%, PR 100%; 09/04/2013 second group of calcifications also biopsied proven to be DCIS ER/PR positive   09/19/2013 Surgery   Left breast lumpectomy DCIS 2 foci margins negative, 0.2 and 0.3 cm ER 100% PR 100%   10/08/2013 - 11/12/2013 Radiation Therapy   Adjuvant radiation therapy   11/12/2013 - 2020 Anti-estrogen oral therapy   Anastrozole 1 mg daily plan is for 5 years   01/09/2014 - 01/10/2014 Hospital Admission   Pipeline embolization 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 The report date is 07/30/2020.   The CancerNext-Expanded + RNAinsight gene panel offered by [employer] and includes sequencing and rearrangement analysis for the following 77 genes:  AIP, ALK, APC, ATM, AXIN2, BAP1, BARD1, BLM, BMPR1A, BRCA1, BRCA2, BRIP1, CDC73, CDH1, CDK4, CDKN1B, CDKN2A, CHEK2, CTNNA1, DICER1, FANCC, FH, FLCN, GALNT12, KIF1B, LZTR1, MAX, MEN1, MET, MLH1, MSH2, MSH3, MSH6, MUTYH, NBN, NF1, NF2, NTHL1, PALB2, PHOX2B, PMS2, POT1, PRKAR1A, PTCH1, PTEN, RAD51C, RAD51D, RB1, RECQL, RET, SDHA, SDHAF2, SDHB, SDHC, SDHD, SMAD4, SMARCA4, SMARCB1, SMARCE1, STK11, SUFU, TMEM127, TP53, TSC1, TSC2, VHL and XRCC2 (sequencing and deletion/duplication); EGFR, EGLN1, HOXB13, KIT, MITF, PDGFRA, POLD1 and POLE (sequencing only); EPCAM and GREM1 (deletion/duplication only). RNA data is routinely analyzed for use in variant interpretation for all genes.   Bladder cancer ([place])  02/09/2018 Initial Diagnosis   Bladder cancer ([place])   07/30/2020 Genetic Testing   Negative genetic testing:  No pathogenic variants detected on the [employer] CancerNext-Expanded + RNAinsight panel. The report date is 07/30/2020.   The CancerNext-Expanded + RNAinsight gene panel offered by [employer] and includes sequencing and rearrangement analysis for the following 77 genes: AIP, ALK, APC, ATM, AXIN2, BAP1, BARD1, BLM, BMPR1A, BRCA1, BRCA2, BRIP1, CDC73, CDH1, CDK4, CDKN1B, CDKN2A, CHEK2, CTNNA1, DICER1, FANCC, FH, FLCN, GALNT12, KIF1B, LZTR1, MAX, MEN1, MET, MLH1, MSH2, MSH3, MSH6, MUTYH, NBN, NF1, NF2, NTHL1, PALB2, PHOX2B, PMS2, POT1, PRKAR1A, PTCH1, PTEN, RAD51C, RAD51D, RB1, RECQL, RET, SDHA, SDHAF2, SDHB, SDHC, SDHD, SMAD4, SMARCA4, SMARCB1, SMARCE1, STK11, SUFU, TMEM127, TP53, TSC1, TSC2, VHL and XRCC2 (sequencing and deletion/duplication); EGFR, EGLN1, HOXB13, KIT, MITF, PDGFRA, POLD1  and POLE (sequencing only); EPCAM and GREM1 (deletion/duplication only). RNA data is routinely analyzed for use in variant interpretation for all genes.   08/31/2020 Imaging   CT CAP w/o contrast  IMPRESSION: 1. No specific findings of active malignancy on today's noncontrast CT. There has been interval partial colectomy  involving the tumor site with reanastomosis. Some faint stranding in the adjacent omentum is probably from scarring or fat necrosis, less likely due to early tumor given the lack of overt nodularity. Surveillance imaging is likely indicated, and PET-CT may have a role in imaging follow up. 2. Several small pulmonary nodules, in addition to a larger mixed density nodule in the left upper lobe. Surveillance is recommended. 3. Other imaging findings of potential clinical significance: Aortic Atherosclerosis (ICD10-I70.0). Coronary atherosclerosis. Mitral valve calcification. Small type 1 hiatal hernia. Splenectomy with small amount of regenerative splenic tissue as well as a venous varix arising from the splenic vein. Hyperdense left kidney lower pole exophytic homogeneous lesion, probably a complex cyst. Sigmoid colon diverticulosis. Multilevel lumbar spondylosis and degenerative disc disease.   Cancer of left colon ([place])  04/23/2020 Procedure   Upper Endoscopy and Colonoscopy by Dr Carlean Purl IMPRESSION Erythematous mucosa in the antrum. Biopsied. - Small hiatal hernia. - Gastroesophageal flap valve classified as Hill Grade IV (no fold, wide open lumen, hiatal hernia present). - The examination was otherwise normal.   IMPRESSION - Decreased sphincter tone found on digital rectal exam. - Malignant partially obstructing tumor in the proximal descending colon. Biopsied. Tattooed. - Three 5 to 20 mm polyps in the sigmoid colon, removed piecemeal using a cold snare. Resected and retrieved. - Severe diverticulosis in the sigmoid colon. - I left several diminutive descending (proximal to tattoo), sigmoid and rectal polyps - The examination was otherwise normal on direct and retroflexion views.     04/23/2020 Initial Diagnosis   Diagnosis 1. Descending Colon Polyp, mass - ADENOCARCINOMA. 2. Sigmoid Colon Polyp, (3) - MULTIPLE FRAGMENTS OF TUBULAR ADENOMA. - MULTIPLE FRAGMENTS OF SESSILE  SERRATED POLYP WITHOUT DYSPLASIA. - NO HIGH GRADE DYSPLASIA OR MALIGNANCY. Microscopic Comment 1. Dr. Vic Ripper has reviewed the case. Dr. Carlean Purl was notified on 04/24/2020. 2. Despite being left sided the fragments have features of a sessile serrated polyp, as opposed to, hyperplastic polyp.   04/27/2020 Imaging   CT AP IMPRESSION: Bulky annular soft tissue mass involving the distal transverse colon with extension into adjacent pericolonic fat, consistent with primary colon carcinoma.   No evidence of metastatic disease.   Colonic diverticulosis. No radiographic evidence of diverticulitis.   Aortic Atherosclerosis (ICD10-I70.0).     05/05/2020 Imaging   CT Chest IMPRESSION: Bulky annular soft tissue mass involving the distal transverse colon with extension into adjacent pericolonic fat, consistent with primary colon carcinoma.   No evidence of metastatic disease.   Colonic diverticulosis. No radiographic evidence of diverticulitis.   Aortic Atherosclerosis (ICD10-I70.0).     05/28/2020 Initial Diagnosis   Cancer of left colon ([place])   05/28/2020 Cancer Staging   Staging form: Colon and Rectum, AJCC 8th Edition - Pathologic stage from 05/28/2020: Stage IIA (pT3, pN0, cM0) - Signed by Truitt Merle, MD on 06/19/2020 Stage prefix: Initial diagnosis Total positive nodes: 0 Histologic grading system: 4 grade system Histologic grade (G): G3 Residual tumor (R): R0 - None    05/28/2020 Surgery   XI ROBOT ASSISTED RESECTION OF SPLENIC FLEXURE by Dr Marcello Moores    FINAL MICROSCOPIC DIAGNOSIS:   A. COLON, SPLENIC FLEXURE, RESECTION:  - Invasive adenocarcinoma, poorly differentiated,  spanning 9.7 cm.  - Tumor invades through muscularis propria into pericolonic tissue.  - Resection margins are negative.  - Tubular adenoma (X1).  - Sessile serrate polyp without dysplasia (x2).  - Diverticulosis.  - Polypectomy scar.  - Twenty-four of twenty-four lymph nodes negative for carcinoma  (0/24).  - See oncology table.    07/07/2020 Miscellaneous   Guardant Reveal ctDNA positive MSI high   07/30/2020 Genetic Testing   Negative genetic testing:  No pathogenic variants detected on the [employer] CancerNext-Expanded + RNAinsight panel. The report date is 07/30/2020.   The CancerNext-Expanded + RNAinsight gene panel offered by [employer] and includes sequencing and rearrangement analysis for the following 77 genes: AIP, ALK, APC, ATM, AXIN2, BAP1, BARD1, BLM, BMPR1A, BRCA1, BRCA2, BRIP1, CDC73, CDH1, CDK4, CDKN1B, CDKN2A, CHEK2, CTNNA1, DICER1, FANCC, FH, FLCN, GALNT12, KIF1B, LZTR1, MAX, MEN1, MET, MLH1, MSH2, MSH3, MSH6, MUTYH, NBN, NF1, NF2, NTHL1, PALB2, PHOX2B, PMS2, POT1, PRKAR1A, PTCH1, PTEN, RAD51C, RAD51D, RB1, RECQL, RET, SDHA, SDHAF2, SDHB, SDHC, SDHD, SMAD4, SMARCA4, SMARCB1, SMARCE1, STK11, SUFU, TMEM127, TP53, TSC1, TSC2, VHL and XRCC2 (sequencing and deletion/duplication); EGFR, EGLN1, HOXB13, KIT, MITF, PDGFRA, POLD1 and POLE (sequencing only); EPCAM and GREM1 (deletion/duplication only). RNA data is routinely analyzed for use in variant interpretation for all genes.   08/31/2020 Imaging   CT CAP w/o contrast  IMPRESSION: 1. No specific findings of active malignancy on today's noncontrast CT. There has been interval partial colectomy involving the tumor site with reanastomosis. Some faint stranding in the adjacent omentum is probably from scarring or fat necrosis, less likely due to early tumor given the lack of overt nodularity. Surveillance imaging is likely indicated, and PET-CT may have a role in imaging follow up. 2. Several small pulmonary nodules, in addition to a larger mixed density nodule in the left upper lobe. Surveillance is recommended. 3. Other imaging findings of potential clinical significance: Aortic Atherosclerosis (ICD10-I70.0). Coronary atherosclerosis. Mitral valve calcification. Small type 1 hiatal hernia. Splenectomy with small amount of  regenerative splenic tissue as well as a venous varix arising from the splenic vein. Hyperdense left kidney lower pole exophytic homogeneous lesion, probably a complex cyst. Sigmoid colon diverticulosis. Multilevel lumbar spondylosis and degenerative disc disease.   10/02/2020 -  Chemotherapy   Patient is on Treatment Plan : COLORECTAL Pembrolizumab q21d        INTERVAL HISTORY:  Briana Ochoa is here for a follow up of colon cancer. She was last seen by me on 11/13/20. She presents to the clinic alone. She reports she has had a soreness/tenderness in her mouth that has been affecting her taste. She states it has been ongoing to the last 2 weeks. She notes she will be participating in a "trunk or treat" with her church.   All other systems were reviewed with the patient and are negative.  MEDICAL HISTORY:  Past Medical History:  Diagnosis Date   Allergy    seasonal allergies   Anemia    on meds   Aortic atherosclerosis (HCC)    Bladder cancer ([place]) 2020   Bladder tumor    Bloody diarrhea 09/29/2017   Breast cancer ([place]) 2015   Left Breast Cancer   Cataract    bilateral -sx   CKD (chronic kidney disease), stage III ([place])    DM related   Colon cancer ([place])    Diabetes mellitus type 2, diet-controlled ([place])    diet controlled   Dyspnea 09/28/2017   Dysuria    Family history of adverse  reaction to anesthesia    aunt- N/V    Family history of breast cancer    Family history of kidney cancer    Family history of throat cancer    Family history of uterine cancer    Fatigue 09/28/2017   Frequency of urination    Grade I diastolic dysfunction 51/70/0174   Noted on ECHO   Gross hematuria 09/29/2017   Heart murmur    since rheumatic fever as child   Hematuria    History of cardiomegaly 02/12/2005   Mild, noted on CXR   History of CVA (cerebrovascular accident) 08/05/2014   post op cerebral angiogram, cerebral thrombosis w/ cerebral infarction;  11-02-2017  per pt  no residuals   History of malignant melanoma of skin 12/2015   s/p wide local excision right lower leg (per pt localized)   History of rheumatic fever as a child    Hyperlipidemia    on meds   Hypertension    on meds   Intracranial aneurysm dx 07/ 2015   paraophthalmic RICA aneurysm /   s/p  Pipeline embolization right ICA 01-09-2014   Jaundice 09/28/2017   Malignant neoplasm of central portion of left breast in female, estrogen receptor positive [facility]) oncologist-  dr Lindi Adie   dx 07/ 2015--- DCIS, ER/PR positive-- s/p  left breast lumpectomy 09-19-2013,  completed radiation 11-12-2013,  started arimidex 11-12-2013   Melanoma ([place])    righ tleg    OA (osteoarthritis)    knees, hands, R shoulder   Osteopenia 10/16/2012   Diffuse   Personal history of radiation therapy    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   Sigmoid diverticulosis    Weakness 09/28/2017   Wears dentures    upper   Wears glasses    Wears hearing aid in both ears     SURGICAL HISTORY: Past Surgical History:  Procedure Laterality Date   BREAST LUMPECTOMY Left 2015   BREAST LUMPECTOMY WITH NEEDLE LOCALIZATION Left 09/19/2013   Procedure: LEFT BREAST LUMPECTOMY WITH DOUBLE WIRE BRACKETED  NEEDLE LOCALIZATION;  Surgeon: [name], MD;  Location: [place];  Service: General;  Laterality: Left;   CATARACT EXTRACTION W/ INTRAOCULAR LENS  IMPLANT, BILATERAL  06/2015   CESAREAN SECTION  1964   CHOLECYSTECTOMY OPEN  1970s   CYSTOSCOPY W/ URETERAL STENT PLACEMENT Left 11/07/2017   Procedure: CYSTOSCOPY WITH RETROGRADE PYELOGRAM/URETERAL STENT PLACEMENT;  Surgeon: Cleon Gustin, MD;  Location[facility];  Service: Urology;  Laterality: Left;   CYSTOSCOPY W/ URETERAL STENT PLACEMENT Bilateral 04/27/2018   Procedure: CYSTOSCOPY WITH RETROGRADE PYELOGRAM;  Surgeon: Cleon Gustin, MD;  [facility];  Service: Urology;  Laterality: Bilateral;   FINGER SURGERY      lt thumb  from dog bite   IR  NEPHROURETERAL CATH PLACE LEFT  04/28/2018   MELANOMA EXCISION Right 12/28/2015   Procedure: EXCISION MELANOMA RIGHT LOWER LEG;  Surgeon: Fanny Skates, MD;  Location: [place];  Service: General;  Laterality: Right;   RADIOLOGY WITH ANESTHESIA N/A 01/09/2014   Procedure: Embolization;  Surgeon: Consuella Lose, MD;  Location: [place];  Service: Radiology;  Laterality: N/A;   SPLENECTOMY, TOTAL  2009 approx.   splen injury due to fall off horse   TONSILLECTOMY  child   TRANSURETHRAL RESECTION OF BLADDER TUMOR N/A 11/07/2017   Procedure: TRANSURETHRAL RESECTION OF BLADDER TUMOR (TURBT), POSSIBLE STENT PLACEMENT;  Surgeon: Cleon Gustin, MD;  Location: [place];  Service: Urology;  Laterality: N/A;   TRANSURETHRAL RESECTION OF BLADDER TUMOR N/A 12/14/2017   Procedure: TRANSURETHRAL RESECTION OF BLADDER TUMOR (TURBT);  Surgeon: Cleon Gustin, MD;  Location: [facility];  Service: Urology;  Laterality: N/A;  [place] TUMOR N/A 04/27/2018   Procedure: TRANSURETHRAL RESECTION OF BLADDER TUMOR (TURBT);  Surgeon: Cleon Gustin, MD;  Location: [facility];  Service: Urology;  Laterality: N/A;  4 MINS    I have reviewed the social history and family history with the patient and they are unchanged from previous note.  ALLERGIES:  is allergic to feraheme [ferumoxytol].  MEDICATIONS:  Current Outpatient Medications  Medication Sig Dispense Refill   acetaminophen (TYLENOL) 500 MG tablet Take 1,000 mg by mouth daily.     capecitabine (XELODA) 500 MG tablet Take 2 tablets (1000 mg total) by mouth in morning and 3 tablets (1500 mg total) by mouth in evening. Take within 30 minutes after meals. Take for 14 days on and 7 days off. Repeat every 21 days. 70 tablet 0   glucose blood (TRUE METRIX BLOOD GLUCOSE TEST) test strip Use as instructed to test blood sugar once  daily E08.638 100 each 2   lisinopril (ZESTRIL) 20 MG tablet TAKE 1 TABLET EVERY DAY 90 tablet 3   ondansetron (ZOFRAN) 8 MG tablet Take 1 tablet (8 mg total) by mouth every 8 (eight) hours as needed for nausea or vomiting. 20 tablet 0   pravastatin (PRAVACHOL) 10 MG tablet TAKE 1 TABLET EVERY DAY (STOP PRAVASTATIN 20MG) 90 tablet 3   No current facility-administered medications for this visit.    PHYSICAL EXAMINATION: ECOG PERFORMANCE STATUS: 1 - Symptomatic but completely ambulatory  Vitals:   12/04/20 1349  BP: (!) 132/55  Pulse: 67  Resp: 18  Temp: 98 F (36.7 C)  SpO2: 96%   Wt Readings from Last 3 Encounters:  12/04/20 175 lb 6.4 oz (79.6 kg)  11/13/20 173 lb 6.4 oz (78.7 kg)  11/04/20 173 lb 6.4 oz (78.7 kg)     GENERAL:alert, no distress and comfortable SKIN: skin color, texture, turgor are normal, no rashes or significant lesions EYES: normal, Conjunctiva are pink and non-injected, sclera clear OROPHARYNX:no exudate, no erythema and lips, buccal mucosa, and tongue normal Musculoskeletal:no cyanosis of digits and no clubbing  NEURO: alert & oriented x 3 with fluent speech, no focal motor/sensory deficits  LABORATORY DATA:  I have reviewed the data as listed CBC Latest Ref Rng & Units 12/04/2020 11/13/2020 10/27/2020  WBC 4.0 - 10.5 K/uL 10.2 10.7(H) 9.2  Hemoglobin 12.0 - 15.0 g/dL 13.7 14.7 13.1  Hematocrit 36.0 - 46.0 % 40.8 45.4 39.7  Platelets 150 - 400 K/uL 297 341 283     CMP Latest Ref Rng & Units 12/04/2020 11/13/2020 10/27/2020  Glucose 70 - 99 mg/dL 135(H) 103(H) 158(H)  BUN 8 - 23 mg/dL 24(H) 29(H) 29(H)  Creatinine 0.44 - 1.00 mg/dL 1.68(H) 1.45(H) 1.78(H)  Sodium 135 - 145 mmol/L 140 140 141  Potassium 3.5 - 5.1 mmol/L 4.5 4.6 4.4  Chloride 98 - 111 mmol/L 109 107 107  CO2 22 - 32 mmol/L 23 24 25   Calcium 8.9 - 10.3 mg/dL 9.3 9.7 9.5  Total Protein 6.5 - 8.1 g/dL 7.0 7.4 6.8  Total Bilirubin 0.3 - 1.2 mg/dL 0.4 0.3 0.4  Alkaline Phos 38 - 126 U/L  108 104 96  AST 15 - 41 U/L 16 14(L)  14(L)  ALT 0 - 44 U/L 8 10 11       RADIOGRAPHIC STUDIES: I have personally reviewed the radiological images as listed and agreed with the findings in the report. No results found.    No orders of the defined types were placed in this encounter.  All questions were answered. The patient knows to call the clinic with any problems, questions or concerns. No barriers to learning was detected. The total time spent in the appointment was 30 minutes.     Truitt Merle, MD 12/04/2020   I, Wilburn Mylar, am acting as scribe for Truitt Merle, MD.   I have reviewed the above documentation for accuracy and completeness, and I agree with the above.

## 2020-12-04 NOTE — Progress Notes (Signed)
Per Dr. Burr Medico, "OK To Treat w/elevated Cr+ today".  Pt has hx of CKD#3.  Have pt to hold Xeloda.

## 2020-12-04 NOTE — Progress Notes (Signed)
Per Dr. Burr Medico, ok to treat with creatnine of 1.68.  She instructs patient is to skip her cycle of xeloda, will discuss this with her at her next office visit.  Patient verbalizes understanding.

## 2020-12-14 ENCOUNTER — Other Ambulatory Visit (HOSPITAL_COMMUNITY): Payer: Self-pay

## 2020-12-25 ENCOUNTER — Inpatient Hospital Stay: Payer: Medicare PPO

## 2020-12-25 ENCOUNTER — Encounter: Payer: Self-pay | Admitting: Hematology

## 2020-12-25 ENCOUNTER — Other Ambulatory Visit: Payer: Self-pay

## 2020-12-25 ENCOUNTER — Inpatient Hospital Stay: Payer: Medicare PPO | Attending: Hematology | Admitting: Hematology

## 2020-12-25 VITALS — BP 141/73 | HR 66 | Temp 97.8°F | Resp 18 | Ht 64.0 in | Wt 170.6 lb

## 2020-12-25 DIAGNOSIS — C185 Malignant neoplasm of splenic flexure: Secondary | ICD-10-CM | POA: Insufficient documentation

## 2020-12-25 DIAGNOSIS — C186 Malignant neoplasm of descending colon: Secondary | ICD-10-CM

## 2020-12-25 DIAGNOSIS — Z85820 Personal history of malignant melanoma of skin: Secondary | ICD-10-CM | POA: Diagnosis not present

## 2020-12-25 DIAGNOSIS — D509 Iron deficiency anemia, unspecified: Secondary | ICD-10-CM | POA: Diagnosis not present

## 2020-12-25 DIAGNOSIS — E1122 Type 2 diabetes mellitus with diabetic chronic kidney disease: Secondary | ICD-10-CM | POA: Diagnosis not present

## 2020-12-25 DIAGNOSIS — I129 Hypertensive chronic kidney disease with stage 1 through stage 4 chronic kidney disease, or unspecified chronic kidney disease: Secondary | ICD-10-CM | POA: Insufficient documentation

## 2020-12-25 DIAGNOSIS — Z853 Personal history of malignant neoplasm of breast: Secondary | ICD-10-CM | POA: Diagnosis not present

## 2020-12-25 DIAGNOSIS — Z8551 Personal history of malignant neoplasm of bladder: Secondary | ICD-10-CM | POA: Diagnosis not present

## 2020-12-25 DIAGNOSIS — N183 Chronic kidney disease, stage 3 unspecified: Secondary | ICD-10-CM | POA: Diagnosis not present

## 2020-12-25 DIAGNOSIS — Z86718 Personal history of other venous thrombosis and embolism: Secondary | ICD-10-CM | POA: Diagnosis not present

## 2020-12-25 LAB — CBC WITH DIFFERENTIAL (CANCER CENTER ONLY)
Abs Immature Granulocytes: 0.02 10*3/uL (ref 0.00–0.07)
Basophils Absolute: 0.1 10*3/uL (ref 0.0–0.1)
Basophils Relative: 1 %
Eosinophils Absolute: 0.5 10*3/uL (ref 0.0–0.5)
Eosinophils Relative: 5 %
HCT: 46.5 % — ABNORMAL HIGH (ref 36.0–46.0)
Hemoglobin: 15.1 g/dL — ABNORMAL HIGH (ref 12.0–15.0)
Immature Granulocytes: 0 %
Lymphocytes Relative: 21 %
Lymphs Abs: 2.1 10*3/uL (ref 0.7–4.0)
MCH: 31.9 pg (ref 26.0–34.0)
MCHC: 32.5 g/dL (ref 30.0–36.0)
MCV: 98.1 fL (ref 80.0–100.0)
Monocytes Absolute: 1.2 10*3/uL — ABNORMAL HIGH (ref 0.1–1.0)
Monocytes Relative: 12 %
Neutro Abs: 6.4 10*3/uL (ref 1.7–7.7)
Neutrophils Relative %: 61 %
Platelet Count: 267 10*3/uL (ref 150–400)
RBC: 4.74 MIL/uL (ref 3.87–5.11)
RDW: 18.1 % — ABNORMAL HIGH (ref 11.5–15.5)
WBC Count: 10.4 10*3/uL (ref 4.0–10.5)
nRBC: 0 % (ref 0.0–0.2)

## 2020-12-25 LAB — CMP (CANCER CENTER ONLY)
ALT: 10 U/L (ref 0–44)
AST: 15 U/L (ref 15–41)
Albumin: 3.9 g/dL (ref 3.5–5.0)
Alkaline Phosphatase: 102 U/L (ref 38–126)
Anion gap: 9 (ref 5–15)
BUN: 23 mg/dL (ref 8–23)
CO2: 24 mmol/L (ref 22–32)
Calcium: 9.3 mg/dL (ref 8.9–10.3)
Chloride: 107 mmol/L (ref 98–111)
Creatinine: 1.47 mg/dL — ABNORMAL HIGH (ref 0.44–1.00)
GFR, Estimated: 36 mL/min — ABNORMAL LOW (ref >60)
Glucose, Bld: 90 mg/dL (ref 70–99)
Potassium: 4.7 mmol/L (ref 3.5–5.1)
Sodium: 140 mmol/L (ref 135–145)
Total Bilirubin: <0.2 mg/dL — ABNORMAL LOW (ref 0.3–1.2)
Total Protein: 7.4 g/dL (ref 6.5–8.1)

## 2020-12-25 MED ORDER — PREDNISONE 10 MG (21) PO TBPK: ORAL_TABLET | ORAL | 0 refills | Status: DC

## 2020-12-25 MED ORDER — SODIUM CHLORIDE 0.9 % IV SOLN: Freq: Once | INTRAVENOUS | Status: DC

## 2020-12-25 MED ORDER — SODIUM CHLORIDE 0.9 % IV SOLN: 200.0000 mg | Freq: Once | INTRAVENOUS | Status: DC

## 2020-12-25 NOTE — Progress Notes (Signed)
[place]   Telephone:(336) [phone] Fax:([phone]   Clinic Follow up Note   Patient Care Team: Nche, [name], NP as PCP - General (Internal Medicine) Fanny Skates, MD as Consulting Physician (General Surgery) Thea Silversmith, MD as Consulting Physician (Radiation Oncology) Nicholas Lose, MD as Consulting Physician (Hematology and Oncology) Garvin Fila, MD as Consulting Physician (Neurology) Consuella Lose, MD as Consulting Physician (Neurosurgery) Katy Apo, MD as Consulting Physician (Ophthalmology) Jonnie Finner, RN (Inactive) as Oncology Nurse Navigator Truitt Merle, MD as Consulting Physician (Hematology and Oncology) Leighton Ruff, MD as Consulting Physician (General Surgery)  Date of Service:  12/25/2020  CHIEF COMPLAINT: f/u of colon cancer  CURRENT THERAPY:  Xeloda (1078m in AM and 15070min PM, 2 weeks on/1 week off) and Keytruda q3weeks, starting 09/21/20, Xeloda held for cycle 2 due to worsening renal function   ASSESSMENT & PLAN:  Briana Ochoa a [age].o. female with   1. Colon cancer in Splenic Flexure, pT3N0M0, Stage II, MSI-High -she was diagnosed on 04/23/20 by Colonoscopy  -She underwent surgical resection with Dr ThMarcello Mooresn 05/28/20. Surgical pathology showed 9.7cm mass in splenic flexure with invasive adenocarcinoma, poorly differentiated was completely removed, negative margins, negative LNs, Stage II. She has intermediate risk of recurrence.  -GuardantReveal on 07/07/20 was positive, which likely indicating microscopic disease and high risk of recurrence. -PET scan was recommended but denied by her insurance. She proceeded with CT CAP w/o contrast on 08/31/20 showing no specific findings of active malignancy.  -She began Xeloda and Keytruda on 09/21/20. She takes Xeloda 100088mn AM and 1500m35m PM, 14 days on/7 days off, and she receives [place] 3 weeks. Plan to give Xeloda 3 to 6 months depending on  her tolerance, and Keytruda 1-2 years if repeat scan showed no evidence of recurrence  -She has tolerated treatment well overall. She was found to have increased Cr of 1.7 on 10/23/20, and we held her Xeloda. Xeloda was restarted on cycle 3, and she developed mild fatigue and taste change. Due to this and low EGFR, we have held Xeloda since cycle 4. -given her persistent mouth symptoms (taste change and dry lips), we will hold Keytruda as well. I will plan to check back in with her virtually in 4 weeks. -plan for restaging scan in 02/2021.  2. Symptom Management: taste change, mouth tenderness -she has experienced taste changes and mouth burning without ulceration. -symptoms persisted despite holding Xeloda. -I explained that this could be related to [facility] this is not a common side effect. We will hold for now, especially given the holidays. I will also prescribe prednisone for her. -I also discussed referral to neurology to discuss the taste change.   2. H/o DVT 04/2020 -she was found to have an elevated D-dimer back on 04/08/20. She was found to have DVT in the RLE. This was still present on 04/28/20 and she was started on Eliquis shortly after. -I discussed that her chemo can increase her risk of blood clots. She notes the Eliquis is expensive. I advised her to switch to baby aspirin.   3. Anemia of iron deficiency  -Secondary to #1 with GI blood loss -She required Blood transfusion and IV Iron. -resolved following surgery 05/2020   4. H/o of Left breast DCIS, H/o Bladder cancer, H/o Melanoma in 2017 of right lower leg -Diagnosed with DCIS in 2015, S/p left lumpectomy, Adjuvant Radiation and 5 years of Anastrozole. Treated by Dr GudeLindi Adieiagnosed  with bladder cancer in 02/2018, early stage, S/p surgery. Followed by Dr. Alyson Ingles, next f/u on 10/28/20. -She will continue surveillance.   5. Genetic Testing -Given her personably history of cancer and family history of cancer, I recommend  genetic testing.  -performed 07/07/20, results negative   6. Comorbidites: CKD, DM, Heart murmur, H/o mini stroke from Brain aneurysm, HTN, Osteopenia -Continue medications and to f/u with other physicians for management.      PLAN:  -cancel Keytruda today and continue to hold Xeloda -I called in tapering dose prednisone (77m X21 tabs) for her mouth symptoms  -virtual visit in 4 weeks. If her taste change dose not improve, will refer her to neurology    No problem-specific Assessment & Plan notes found for this encounter.   SUMMARY OF ONCOLOGIC HISTORY: Oncology History Overview Note  Cancer Staging Cancer of left colon ([facility] Staging form: Colon and Rectum, AJCC 8th Edition - Pathologic stage from 05/28/2020: Stage IIA (pT3, pN0, cM0) - Signed by FTruitt Merle MD on 06/19/2020 Stage prefix: Initial diagnosis Total positive nodes: 0 Histologic grading system: 4 grade system Histologic grade (G): G3 Residual tumor (R): R0 - None  Ductal carcinoma in situ (DCIS) of left breast Staging form: Breast, AJCC 7th Edition - Clinical: Stage 0 (Tis (DCIS), N0, cM0) - Unsigned Specimen type: Core Needle Biopsy Histopathologic type: 9932 Laterality: Left Staging comments: Staged at breast conference 7.22.15  - Pathologic: Stage 0 (Tis (DCIS)(2), N0, cM0) - Unsigned Specimen type: Core Needle Biopsy Histopathologic type: 9932 Laterality: Left Tumor size (mm): 3 Multiple tumors: Yes Number of tumors: 2 Method of lymph node assessment: Clinical Method of detection of distant metastases: Clinical Residual tumor (R): R0 - None Estrogen receptor status: Positive Progesterone receptor status: Positive    Ductal carcinoma in situ (DCIS) of left breast  08/20/2013 Initial Diagnosis   Left breast 12:00: DCIS with calcifications, yet 100%, PR 100%; 09/04/2013 second group of calcifications also biopsied proven to be DCIS ER/PR positive   09/19/2013 Surgery   Left breast lumpectomy DCIS 2 foci  margins negative, 0.2 and 0.3 cm ER 100% PR 100%   10/08/2013 - 11/12/2013 Radiation Therapy   Adjuvant radiation therapy   11/12/2013 - 2020 Anti-estrogen oral therapy   Anastrozole 1 mg daily plan is for 5 years   01/09/2014 - 01/10/2014 Hospital Admission   Pipeline embolization 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 The report date is 07/30/2020.   The CancerNext-Expanded + RNAinsight gene panel offered by [employer] includes sequencing and rearrangement analysis for the following 77 genes: AIP, ALK, APC, ATM, AXIN2, BAP1, BARD1, BLM, BMPR1A, BRCA1, BRCA2, BRIP1, CDC73, CDH1, CDK4, CDKN1B, CDKN2A, CHEK2, CTNNA1, DICER1, FANCC, FH, FLCN, GALNT12, KIF1B, LZTR1, MAX, MEN1, MET, MLH1, MSH2, MSH3, MSH6, MUTYH, NBN, NF1, NF2, NTHL1, PALB2, PHOX2B, PMS2, POT1, PRKAR1A, PTCH1, PTEN, RAD51C, RAD51D, RB1, RECQL, RET, SDHA, SDHAF2, SDHB, SDHC, SDHD, SMAD4, SMARCA4, SMARCB1, SMARCE1, STK11, SUFU, TMEM127, TP53, TSC1, TSC2, VHL and XRCC2 (sequencing and deletion/duplication); EGFR, EGLN1, HOXB13, KIT, MITF, PDGFRA, POLD1 and POLE (sequencing only); EPCAM and GREM1 (deletion/duplication only). RNA data is routinely analyzed for use in variant interpretation for all genes.   Bladder cancer ([place]  02/09/2018 Initial Diagnosis   Bladder cancer ([place]   07/30/2020 Genetic Testing   Negative genetic testing:  No pathogenic variants detected on the [employer] CancerNext-Expanded + RNAinsight panel. The report date  is 07/30/2020.   The CancerNext-Expanded + RNAinsight gene panel offered by [employer] and includes sequencing and rearrangement analysis for the following 77 genes: AIP, ALK, APC, ATM, AXIN2, BAP1, BARD1, BLM, BMPR1A, BRCA1, BRCA2, BRIP1, CDC73, CDH1, CDK4, CDKN1B, CDKN2A, CHEK2, CTNNA1, DICER1, FANCC, FH, FLCN, GALNT12, KIF1B, LZTR1, MAX, MEN1, MET, MLH1, MSH2, MSH3, MSH6,  MUTYH, NBN, NF1, NF2, NTHL1, PALB2, PHOX2B, PMS2, POT1, PRKAR1A, PTCH1, PTEN, RAD51C, RAD51D, RB1, RECQL, RET, SDHA, SDHAF2, SDHB, SDHC, SDHD, SMAD4, SMARCA4, SMARCB1, SMARCE1, STK11, SUFU, TMEM127, TP53, TSC1, TSC2, VHL and XRCC2 (sequencing and deletion/duplication); EGFR, EGLN1, HOXB13, KIT, MITF, PDGFRA, POLD1 and POLE (sequencing only); EPCAM and GREM1 (deletion/duplication only). RNA data is routinely analyzed for use in variant interpretation for all genes.   08/31/2020 Imaging   CT CAP w/o contrast  IMPRESSION: 1. No specific findings of active malignancy on today's noncontrast CT. There has been interval partial colectomy involving the tumor site with reanastomosis. Some faint stranding in the adjacent omentum is probably from scarring or fat necrosis, less likely due to early tumor given the lack of overt nodularity. Surveillance imaging is likely indicated, and PET-CT may have a role in imaging follow up. 2. Several small pulmonary nodules, in addition to a larger mixed density nodule in the left upper lobe. Surveillance is recommended. 3. Other imaging findings of potential clinical significance: Aortic Atherosclerosis (ICD10-I70.0). Coronary atherosclerosis. Mitral valve calcification. Small type 1 hiatal hernia. Splenectomy with small amount of regenerative splenic tissue as well as a venous varix arising from the splenic vein. Hyperdense left kidney lower pole exophytic homogeneous lesion, probably a complex cyst. Sigmoid colon diverticulosis. Multilevel lumbar spondylosis and degenerative disc disease.   Cancer of left colon ([place])  04/23/2020 Procedure   Upper Endoscopy and Colonoscopy by Dr Carlean Purl IMPRESSION Erythematous mucosa in the antrum. Biopsied. - Small hiatal hernia. - Gastroesophageal flap valve classified as Hill Grade IV (no fold, wide open lumen, hiatal hernia present). - The examination was otherwise normal.   IMPRESSION - Decreased sphincter tone found  on digital rectal exam. - Malignant partially obstructing tumor in the proximal descending colon. Biopsied. Tattooed. - Three 5 to 20 mm polyps in the sigmoid colon, removed piecemeal using a cold snare. Resected and retrieved. - Severe diverticulosis in the sigmoid colon. - I left several diminutive descending (proximal to tattoo), sigmoid and rectal polyps - The examination was otherwise normal on direct and retroflexion views.     04/23/2020 Initial Diagnosis   Diagnosis 1. Descending Colon Polyp, mass - ADENOCARCINOMA. 2. Sigmoid Colon Polyp, (3) - MULTIPLE FRAGMENTS OF TUBULAR ADENOMA. - MULTIPLE FRAGMENTS OF SESSILE SERRATED POLYP WITHOUT DYSPLASIA. - NO HIGH GRADE DYSPLASIA OR MALIGNANCY. Microscopic Comment 1. Dr. Vic Ripper has reviewed the case. Dr. Carlean Purl was notified on 04/24/2020. 2. Despite being left sided the fragments have features of a sessile serrated polyp, as opposed to, hyperplastic polyp.   04/27/2020 Imaging   CT AP IMPRESSION: Bulky annular soft tissue mass involving the distal transverse colon with extension into adjacent pericolonic fat, consistent with primary colon carcinoma.   No evidence of metastatic disease.   Colonic diverticulosis. No radiographic evidence of diverticulitis.   Aortic Atherosclerosis (ICD10-I70.0).     05/05/2020 Imaging   CT Chest IMPRESSION: Bulky annular soft tissue mass involving the distal transverse colon with extension into adjacent pericolonic fat, consistent with primary colon carcinoma.   No evidence of metastatic disease.   Colonic diverticulosis. No radiographic evidence of diverticulitis.   Aortic Atherosclerosis (ICD10-I70.0).  05/28/2020 Initial Diagnosis   Cancer of left colon ([place])   05/28/2020 Cancer Staging   Staging form: Colon and Rectum, AJCC 8th Edition - Pathologic stage from 05/28/2020: Stage IIA (pT3, pN0, cM0) - Signed by Truitt Merle, MD on 06/19/2020 Stage prefix: Initial diagnosis Total  positive nodes: 0 Histologic grading system: 4 grade system Histologic grade (G): G3 Residual tumor (R): R0 - None    05/28/2020 Surgery   XI ROBOT ASSISTED RESECTION OF SPLENIC FLEXURE by Dr Marcello Moores    FINAL MICROSCOPIC DIAGNOSIS:   A. COLON, SPLENIC FLEXURE, RESECTION:  - Invasive adenocarcinoma, poorly differentiated, spanning 9.7 cm.  - Tumor invades through muscularis propria into pericolonic tissue.  - Resection margins are negative.  - Tubular adenoma (X1).  - Sessile serrate polyp without dysplasia (x2).  - Diverticulosis.  - Polypectomy scar.  - Twenty-four of twenty-four lymph nodes negative for carcinoma (0/24).  - See oncology table.    07/07/2020 Miscellaneous   Guardant Reveal ctDNA positive MSI high   07/30/2020 Genetic Testing   Negative genetic testing:  No pathogenic variants detected on the [employer] CancerNext-Expanded + RNAinsight panel. The report date is 07/30/2020.   The CancerNext-Expanded + RNAinsight gene panel offered by [employer] and includes sequencing and rearrangement analysis for the following 77 genes: AIP, ALK, APC, ATM, AXIN2, BAP1, BARD1, BLM, BMPR1A, BRCA1, BRCA2, BRIP1, CDC73, CDH1, CDK4, CDKN1B, CDKN2A, CHEK2, CTNNA1, DICER1, FANCC, FH, FLCN, GALNT12, KIF1B, LZTR1, MAX, MEN1, MET, MLH1, MSH2, MSH3, MSH6, MUTYH, NBN, NF1, NF2, NTHL1, PALB2, PHOX2B, PMS2, POT1, PRKAR1A, PTCH1, PTEN, RAD51C, RAD51D, RB1, RECQL, RET, SDHA, SDHAF2, SDHB, SDHC, SDHD, SMAD4, SMARCA4, SMARCB1, SMARCE1, STK11, SUFU, TMEM127, TP53, TSC1, TSC2, VHL and XRCC2 (sequencing and deletion/duplication); EGFR, EGLN1, HOXB13, KIT, MITF, PDGFRA, POLD1 and POLE (sequencing only); EPCAM and GREM1 (deletion/duplication only). RNA data is routinely analyzed for use in variant interpretation for all genes.   08/31/2020 Imaging   CT CAP w/o contrast  IMPRESSION: 1. No specific findings of active malignancy on today's noncontrast CT. There has been interval partial colectomy involving the  tumor site with reanastomosis. Some faint stranding in the adjacent omentum is probably from scarring or fat necrosis, less likely due to early tumor given the lack of overt nodularity. Surveillance imaging is likely indicated, and PET-CT may have a role in imaging follow up. 2. Several small pulmonary nodules, in addition to a larger mixed density nodule in the left upper lobe. Surveillance is recommended. 3. Other imaging findings of potential clinical significance: Aortic Atherosclerosis (ICD10-I70.0). Coronary atherosclerosis. Mitral valve calcification. Small type 1 hiatal hernia. Splenectomy with small amount of regenerative splenic tissue as well as a venous varix arising from the splenic vein. Hyperdense left kidney lower pole exophytic homogeneous lesion, probably a complex cyst. Sigmoid colon diverticulosis. Multilevel lumbar spondylosis and degenerative disc disease.   10/02/2020 -  Chemotherapy   Patient is on Treatment Plan : COLORECTAL Pembrolizumab q21d        INTERVAL HISTORY:  Briana Ochoa is here for a follow up of colon cancer. She was last seen by me on 12/04/20. She presents to the clinic alone. She reports continued mouth issues-- tenderness, some burning and numbness, cannot taste sweet or salt.   All other systems were reviewed with the patient and are negative.  MEDICAL HISTORY:  Past Medical History:  Diagnosis Date   Allergy    seasonal allergies   Anemia    on meds   Aortic atherosclerosis ([place])    Bladder cancer ([place])  2020   Bladder tumor    Bloody diarrhea 09/29/2017   Breast cancer ([place]) 2015   Left Breast Cancer   Cataract    bilateral -sx   CKD (chronic kidney disease), stage III (HCC)    DM related   Colon cancer ([place])    Diabetes mellitus type 2, diet-controlled ([place])    diet controlled   Dyspnea 09/28/2017   Dysuria    Family history of adverse reaction to anesthesia    aunt- N/V    Family history of breast cancer     Family history of kidney cancer    Family history of throat cancer    Family history of uterine cancer    Fatigue 09/28/2017   Frequency of urination    Grade I diastolic dysfunction 93/26/7124   Noted on ECHO   Gross hematuria 09/29/2017   Heart murmur    since rheumatic fever as child   Hematuria    History of cardiomegaly 02/12/2005   Mild, noted on CXR   History of CVA (cerebrovascular accident) 08/05/2014   post op cerebral angiogram, cerebral thrombosis w/ cerebral infarction;  11-02-2017  per pt no residuals   History of malignant melanoma of skin 12/2015   s/p wide local excision right lower leg (per pt localized)   History of rheumatic fever as a child    Hyperlipidemia    on meds   Hypertension    on meds   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   Sigmoid diverticulosis    Weakness 09/28/2017   Wears dentures    upper   Wears glasses    Wears hearing aid in both ears     SURGICAL HISTORY: Past Surgical History:  Procedure Laterality Date   BREAST LUMPECTOMY Left 2015   BREAST LUMPECTOMY WITH NEEDLE LOCALIZATION Left 09/19/2013   Procedure: LEFT BREAST LUMPECTOMY WITH DOUBLE WIRE BRACKETED  NEEDLE LOCALIZATION;  Surgeon: [name], MD;  Location: [facility];  Service: General;  Laterality: Left;   CATARACT EXTRACTION W/ INTRAOCULAR LENS  IMPLANT, BILATERAL  06/2015   CESAREAN SECTION  1964    CHOLECYSTECTOMY OPEN  1970s   CYSTOSCOPY W/ URETERAL STENT PLACEMENT Left 11/07/2017   Procedure: CYSTOSCOPY WITH RETROGRADE PYELOGRAM/URETERAL STENT PLACEMENT;  Surgeon: Cleon Gustin, MD;  Location[facility];  Service: Urology;  Laterality: Left;   CYSTOSCOPY W/ URETERAL STENT PLACEMENT Bilateral 04/27/2018   Procedure: CYSTOSCOPY WITH RETROGRADE PYELOGRAM;  Surgeon: Cleon Gustin, MD;  Location: [facility];  Service: Urology;  Laterality: Bilateral;   FINGER SURGERY     lt thumb  from dog bite   IR  NEPHROURETERAL CATH PLACE LEFT  04/28/2018   MELANOMA EXCISION Right 12/28/2015   Procedure: EXCISION MELANOMA RIGHT LOWER LEG;  Surgeon: Fanny Skates, MD;  Location: [place];  Service: General;  Laterality: Right;  RADIOLOGY WITH ANESTHESIA N/A 01/09/2014   Procedure: Embolization;  Surgeon: Consuella Lose, MD;  Location: [place];  Service: Radiology;  Laterality: N/A;   SPLENECTOMY, TOTAL  2009 approx.   splen injury due to fall off horse   TONSILLECTOMY  child   TRANSURETHRAL RESECTION OF BLADDER TUMOR N/A 11/07/2017   Procedure: TRANSURETHRAL RESECTION OF BLADDER TUMOR (TURBT), POSSIBLE STENT PLACEMENT;  Surgeon: Cleon Gustin, MD;  Location: [facility];  Service: Urology;  Laterality: N/A;   TRANSURETHRAL RESECTION OF BLADDER TUMOR N/A 12/14/2017   Procedure: TRANSURETHRAL RESECTION OF BLADDER TUMOR (TURBT);  Surgeon: Cleon Gustin, MD;  Location: [facility];  Service: Urology;  Laterality: N/A;  [place] TUMOR N/A 04/27/2018   Procedure: TRANSURETHRAL RESECTION OF BLADDER TUMOR (TURBT);  Surgeon: Cleon Gustin, MD;  Location: [facility];  Service: Urology;  Laterality: N/A;  61 MINS    I have reviewed the social history and family history with the patient and they are unchanged from previous note.  ALLERGIES:  is allergic to  feraheme [ferumoxytol].  MEDICATIONS:  Current Outpatient Medications  Medication Sig Dispense Refill   predniSONE (STERAPRED UNI-PAK 21 TAB) 10 MG (21) TBPK tablet Take 4 tabs daily for 2 days then drip one tab every 2 days until finish. 21 tablet 0   acetaminophen (TYLENOL) 500 MG tablet Take 1,000 mg by mouth daily.     capecitabine (XELODA) 500 MG tablet Take 2 tablets (1000 mg total) by mouth in morning and 3 tablets (1500 mg total) by mouth in evening. Take within 30 minutes after meals. Take for 14 days on and 7 days off. Repeat every 21 days. 70 tablet 0   glucose blood (TRUE METRIX BLOOD GLUCOSE TEST) test strip Use as instructed to test blood sugar once daily E08.638 100 each 2   lisinopril (ZESTRIL) 20 MG tablet TAKE 1 TABLET EVERY DAY 90 tablet 3   ondansetron (ZOFRAN) 8 MG tablet Take 1 tablet (8 mg total) by mouth every 8 (eight) hours as needed for nausea or vomiting. 20 tablet 0   pravastatin (PRAVACHOL) 10 MG tablet TAKE 1 TABLET EVERY DAY (STOP PRAVASTATIN 20MG) 90 tablet 3   Current Facility-Administered Medications  Medication Dose Route Frequency Provider Last Rate Last Admin   0.9 %  sodium chloride infusion   Intravenous Once Truitt Merle, MD        PHYSICAL EXAMINATION: ECOG PERFORMANCE STATUS: 1 - Symptomatic but completely ambulatory  Vitals:   12/25/20 1321  BP: (!) 141/73  Pulse: 66  Resp: 18  Temp: 97.8 F (36.6 C)  SpO2: 99%   Wt Readings from Last 3 Encounters:  12/25/20 170 lb 9.6 oz (77.4 kg)  12/04/20 175 lb 6.4 oz (79.6 kg)  11/13/20 173 lb 6.4 oz (78.7 kg)     GENERAL:alert, no distress and comfortable SKIN: skin color, texture, turgor are normal, no rashes or significant lesions EYES: normal, Conjunctiva are pink and non-injected, sclera clear OROPHARYNX:no exudate, no erythema and lips, buccal mucosa, and tongue normal NEURO: alert & oriented x 3 with fluent speech, no focal motor/sensory deficits  LABORATORY DATA:  I have reviewed the  data as listed CBC Latest Ref Rng & Units 12/25/2020 12/04/2020 11/13/2020  WBC 4.0 - 10.5 K/uL 10.4 10.2 10.7(H)  Hemoglobin 12.0 - 15.0 g/dL 15.1(H) 13.7 14.7  Hematocrit 36.0 - 46.0 % 46.5(H) 40.8 45.4  Platelets 150 - 400 K/uL 267 297 341  CMP Latest Ref Rng & Units 12/25/2020 12/04/2020 11/13/2020  Glucose 70 - 99 mg/dL 90 135(H) 103(H)  BUN 8 - 23 mg/dL 23 24(H) 29(H)  Creatinine 0.44 - 1.00 mg/dL 1.47(H) 1.68(H) 1.45(H)  Sodium 135 - 145 mmol/L 140 140 140  Potassium 3.5 - 5.1 mmol/L 4.7 4.5 4.6  Chloride 98 - 111 mmol/L 107 109 107  CO2 22 - 32 mmol/L 24 23 24   Calcium 8.9 - 10.3 mg/dL 9.3 9.3 9.7  Total Protein 6.5 - 8.1 g/dL 7.4 7.0 7.4  Total Bilirubin 0.3 - 1.2 mg/dL <0.2(L) 0.4 0.3  Alkaline Phos 38 - 126 U/L 102 108 104  AST 15 - 41 U/L 15 16 14(L)  ALT 0 - 44 U/L 10 8 10       RADIOGRAPHIC STUDIES: I have personally reviewed the radiological images as listed and agreed with the findings in the report. No results found.    Orders Placed This Encounter  Procedures   [place]    Chemotherapy Appointment - 2 hr   TREATMENT CONDITIONS    Patient should have CBC & CMP within 7 days prior to chemotherapy administration. NOTIFY MD IF: ANC < 1500, Hemoglobin < 8, PLT < 100,000,  Total Bili > 1.5, Creatinine > 2.0, ALT & AST > 80 or if patient has unstable vital signs: Temperature > 38.5, SBP > 180 or < 90, RR > 30 or HR > 100.   All questions were answered. The patient knows to call the clinic with any problems, questions or concerns. No barriers to learning was detected. The total time spent in the appointment was 30 minutes.     Truitt Merle, MD 12/25/2020   I, Wilburn Mylar, am acting as scribe for Truitt Merle, MD.   I have reviewed the above documentation for accuracy and completeness, and I agree with the above.

## 2020-12-29 ENCOUNTER — Other Ambulatory Visit (HOSPITAL_COMMUNITY): Payer: Self-pay

## 2020-12-29 ENCOUNTER — Telehealth: Payer: Self-pay | Admitting: Hematology

## 2020-12-29 NOTE — Telephone Encounter (Signed)
Left message with follow-up appointment per 11/18 los.

## 2021-01-08 ENCOUNTER — Other Ambulatory Visit: Payer: Self-pay | Admitting: Nurse Practitioner

## 2021-01-08 DIAGNOSIS — Z1231 Encounter for screening mammogram for malignant neoplasm of breast: Secondary | ICD-10-CM

## 2021-01-19 DIAGNOSIS — Z8679 Personal history of other diseases of the circulatory system: Secondary | ICD-10-CM | POA: Diagnosis not present

## 2021-01-19 DIAGNOSIS — I671 Cerebral aneurysm, nonruptured: Secondary | ICD-10-CM | POA: Diagnosis not present

## 2021-01-20 ENCOUNTER — Other Ambulatory Visit (HOSPITAL_COMMUNITY): Payer: Self-pay

## 2021-01-22 ENCOUNTER — Encounter: Payer: Self-pay | Admitting: Hematology

## 2021-01-22 ENCOUNTER — Inpatient Hospital Stay: Payer: Medicare PPO | Attending: Hematology | Admitting: Hematology

## 2021-01-22 DIAGNOSIS — C186 Malignant neoplasm of descending colon: Secondary | ICD-10-CM

## 2021-01-22 NOTE — Progress Notes (Addendum)
Briana Ochoa   [phone] [phone]   Clinic Follow up Note   Patient Care Team: Nche, [name], NP as PCP - General (Internal Medicine) Fanny Skates, MD as Consulting Physician (General Surgery) Thea Silversmith, MD as Consulting Physician (Radiation Oncology) Nicholas Lose, MD as Consulting Physician (Hematology and Oncology) Garvin Fila, MD as Consulting Physician (Neurology) Consuella Lose, MD as Consulting Physician (Neurosurgery) Katy Apo, MD as Consulting Physician (Ophthalmology) Jonnie Finner, RN (Inactive) as Oncology Nurse Navigator Truitt Merle, MD as Consulting Physician (Hematology and Oncology) Leighton Ruff, MD as Consulting Physician (General Surgery)  Date of Service:  01/22/2021  I connected with Briana Ochoa on 01/22/2021 at  4:00 PM EST by telephone visit and verified that I am speaking with the correct person using two identifiers.  I discussed the limitations, risks, security and privacy concerns of performing an evaluation and management service by telephone and the availability of in person appointments. I also discussed with the patient that there may be a patient responsible charge related to this service. The patient expressed understanding and agreed to proceed.   Other persons participating in the visit and their role in the encounter:  Daughter   Patient's location:  Home  Provider's location:  my office  CHIEF COMPLAINT: f/u of colon cancer  CURRENT THERAPY:  Xeloda and Keytruda, which has been held after cycle 4 on 12/04/2020  ASSESSMENT & PLAN:  Briana Ochoa is a 80 y.o. female with   1. Colon cancer in Splenic Flexure, pT3N0M0, Stage II, MSI-High -she was diagnosed on 04/23/20 by Colonoscopy  -resection with Dr Marcello Moores on 05/28/20 showed 9.7cm mass in splenic flexure with invasive adenocarcinoma, poorly differentiated, negative margins, negative LNs.   -GuardantReveal on 07/07/20 was positive, which likely indicates microscopic disease and high risk of recurrence. -PET scan was recommended but denied by her insurance. She proceeded with CT CAP w/o contrast on 08/31/20 showing no specific findings of active malignancy.  -She began Xeloda and Keytruda on 09/21/20.  -She has tolerated treatment well overall. She was found to have increased Cr of 1.7 on 10/23/20, and we held her Xeloda. Xeloda was restarted on cycle 3, and she developed mild fatigue and taste change. Due to this and low EGFR, we have held Xeloda since cycle 4. -given her persistent mouth symptoms (taste change and dry lips), Beryle Flock has been held after cycle 4 -Her mild symptom has not improved with steroid and holding treatment, may not be related to her treatment. -We discussed restarting treatment after the holiday.  She will think about it.    2. Symptom Management: taste change, mouth tenderness -she has experienced taste changes and mouth burning without ulceration. -symptoms persisted despite holding Xeloda and Keytruda. -No improvement with a course of steroid, indicating this is probably not related to [facility] -She has appointment with her neurosurgeon on Monday for brain aneurysm, she had a brain image,I will request a copy  -f/u with neurologist if needed    2. H/o DVT 04/2020 -she was found to have an elevated D-dimer back on 04/08/20. She was found to have DVT in the RLE. This was still present on 04/28/20 and she was started on Eliquis shortly after. -I discussed that her chemo can increase her risk of blood clots. She notes the Eliquis is expensive. I advised her to switch to baby aspirin.   3. Anemia of iron deficiency  -Secondary to #1 with GI blood loss -She required Blood transfusion  and IV Iron. -resolved following surgery 05/2020   4. H/o of Left breast DCIS, H/o Bladder cancer, H/o Melanoma in 2017 of right lower leg -Diagnosed with DCIS in 2015, S/p left  lumpectomy, Adjuvant Radiation and 5 years of Anastrozole. Treated by Dr Lindi Adie. -Diagnosed with bladder cancer in 02/2018, early stage, S/p surgery. Followed by Dr. Alyson Ingles, next f/u on 10/28/20. -She will continue surveillance.   5. Genetic Testing -Given her personably history of cancer and family history of cancer, I recommend genetic testing.  -performed 07/07/20, results negative   6. Comorbidites: CKD, DM, Heart murmur, H/o mini stroke from Brain aneurysm, HTN, Osteopenia -Continue medications and to f/u with other physicians for management.      PLAN:  -She has appointment with her neurosurgeon on Monday to review her brain image -Continue holding treatment for now, due to the upcoming holiday -Lab, follow-up and Keytruda after new year   No problem-specific Assessment & Plan notes found for this encounter.   SUMMARY OF ONCOLOGIC HISTORY: Oncology History Overview Note  Cancer Staging Cancer of left colon [facility]) Staging form: Colon and Rectum, AJCC 8th Edition - Pathologic stage from 05/28/2020: Stage IIA (pT3, pN0, cM0) - Signed by Truitt Merle, MD on 06/19/2020 Stage prefix: Initial diagnosis Total positive nodes: 0 Histologic grading system: 4 grade system Histologic grade (G): G3 Residual tumor (R): R0 - None  Ductal carcinoma in situ (DCIS) of left breast Staging form: Breast, AJCC 7th Edition - Clinical: Stage 0 (Tis (DCIS), N0, cM0) - Unsigned Specimen type: Core Needle Biopsy Histopathologic type: 9932 Laterality: Left Staging comments: Staged at breast conference 7.22.15  - Pathologic: Stage 0 (Tis (DCIS)(2), N0, cM0) - Unsigned Specimen type: Core Needle Biopsy Histopathologic type: 9932 Laterality: Left Tumor size (mm): 3 Multiple tumors: Yes Number of tumors: 2 Method of lymph node assessment: Clinical Method of detection of distant metastases: Clinical Residual tumor (R): R0 - None Estrogen receptor status: Positive Progesterone receptor status:  Positive    Ductal carcinoma in situ (DCIS) of left breast  08/20/2013 Initial Diagnosis   Left breast 12:00: DCIS with calcifications, yet 100%, PR 100%; 09/04/2013 second group of calcifications also biopsied proven to be DCIS ER/PR positive   09/19/2013 Surgery   Left breast lumpectomy DCIS 2 foci margins negative, 0.2 and 0.3 cm ER 100% PR 100%   10/08/2013 - 11/12/2013 Radiation Therapy   Adjuvant radiation therapy   11/12/2013 - 2020 Anti-estrogen oral therapy   Anastrozole 1 mg daily plan is for 5 years   01/09/2014 - 01/10/2014 Hospital Admission   Pipeline embolization 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 The report date is 07/30/2020.   The CancerNext-Expanded + RNAinsight gene panel offered by [employer] and includes sequencing and rearrangement analysis for the following 77 genes: AIP, ALK, APC, ATM, AXIN2, BAP1, BARD1, BLM, BMPR1A, BRCA1, BRCA2, BRIP1, CDC73, CDH1, CDK4, CDKN1B, CDKN2A, CHEK2, CTNNA1, DICER1, FANCC, FH, FLCN, GALNT12, KIF1B, LZTR1, MAX, MEN1, MET, MLH1, MSH2, MSH3, MSH6, MUTYH, NBN, NF1, NF2, NTHL1, PALB2, PHOX2B, PMS2, POT1, PRKAR1A, PTCH1, PTEN, RAD51C, RAD51D, RB1, RECQL, RET, SDHA, SDHAF2, SDHB, SDHC, SDHD, SMAD4, SMARCA4, SMARCB1, SMARCE1, STK11, SUFU, TMEM127, TP53, TSC1, TSC2, VHL and XRCC2 (sequencing and deletion/duplication); EGFR, EGLN1, HOXB13, KIT, MITF, PDGFRA, POLD1 and POLE (sequencing only); EPCAM and GREM1 (deletion/duplication only). RNA data is routinely analyzed for use in variant interpretation for all genes.   Bladder  cancer ([place])  02/09/2018 Initial Diagnosis   Bladder cancer ([place])   07/30/2020 Genetic Testing   Negative genetic testing:  No pathogenic variants detected on the [employer] CancerNext-Expanded + RNAinsight panel. The report date is 07/30/2020.   The CancerNext-Expanded + RNAinsight gene panel offered by  [employer] and includes sequencing and rearrangement analysis for the following 77 genes: AIP, ALK, APC, ATM, AXIN2, BAP1, BARD1, BLM, BMPR1A, BRCA1, BRCA2, BRIP1, CDC73, CDH1, CDK4, CDKN1B, CDKN2A, CHEK2, CTNNA1, DICER1, FANCC, FH, FLCN, GALNT12, KIF1B, LZTR1, MAX, MEN1, MET, MLH1, MSH2, MSH3, MSH6, MUTYH, NBN, NF1, NF2, NTHL1, PALB2, PHOX2B, PMS2, POT1, PRKAR1A, PTCH1, PTEN, RAD51C, RAD51D, RB1, RECQL, RET, SDHA, SDHAF2, SDHB, SDHC, SDHD, SMAD4, SMARCA4, SMARCB1, SMARCE1, STK11, SUFU, TMEM127, TP53, TSC1, TSC2, VHL and XRCC2 (sequencing and deletion/duplication); EGFR, EGLN1, HOXB13, KIT, MITF, PDGFRA, POLD1 and POLE (sequencing only); EPCAM and GREM1 (deletion/duplication only). RNA data is routinely analyzed for use in variant interpretation for all genes.   08/31/2020 Imaging   CT CAP w/o contrast  IMPRESSION: 1. No specific findings of active malignancy on today's noncontrast CT. There has been interval partial colectomy involving the tumor site with reanastomosis. Some faint stranding in the adjacent omentum is probably from scarring or fat necrosis, less likely due to early tumor given the lack of overt nodularity. Surveillance imaging is likely indicated, and PET-CT may have a role in imaging follow up. 2. Several small pulmonary nodules, in addition to a larger mixed density nodule in the left upper lobe. Surveillance is recommended. 3. Other imaging findings of potential clinical significance: Aortic Atherosclerosis (ICD10-I70.0). Coronary atherosclerosis. Mitral valve calcification. Small type 1 hiatal hernia. Splenectomy with small amount of regenerative splenic tissue as well as a venous varix arising from the splenic vein. Hyperdense left kidney lower pole exophytic homogeneous lesion, probably a complex cyst. Sigmoid colon diverticulosis. Multilevel lumbar spondylosis and degenerative disc disease.   Cancer of left colon ([place])  04/23/2020 Procedure   Upper Endoscopy and  Colonoscopy by Dr Carlean Purl IMPRESSION Erythematous mucosa in the antrum. Biopsied. - Small hiatal hernia. - Gastroesophageal flap valve classified as Hill Grade IV (no fold, wide open lumen, hiatal hernia present). - The examination was otherwise normal.   IMPRESSION - Decreased sphincter tone found on digital rectal exam. - Malignant partially obstructing tumor in the proximal descending colon. Biopsied. Tattooed. - Three 5 to 20 mm polyps in the sigmoid colon, removed piecemeal using a cold snare. Resected and retrieved. - Severe diverticulosis in the sigmoid colon. - I left several diminutive descending (proximal to tattoo), sigmoid and rectal polyps - The examination was otherwise normal on direct and retroflexion views.     04/23/2020 Initial Diagnosis   Diagnosis 1. Descending Colon Polyp, mass - ADENOCARCINOMA. 2. Sigmoid Colon Polyp, (3) - MULTIPLE FRAGMENTS OF TUBULAR ADENOMA. - MULTIPLE FRAGMENTS OF SESSILE SERRATED POLYP WITHOUT DYSPLASIA. - NO HIGH GRADE DYSPLASIA OR MALIGNANCY. Microscopic Comment 1. Dr. Vic Ripper has reviewed the case. Dr. Carlean Purl was notified on 04/24/2020. 2. Despite being left sided the fragments have features of a sessile serrated polyp, as opposed to, hyperplastic polyp.   04/27/2020 Imaging   CT AP IMPRESSION: Bulky annular soft tissue mass involving the distal transverse colon with extension into adjacent pericolonic fat, consistent with primary colon carcinoma.   No evidence of metastatic disease.   Colonic diverticulosis. No radiographic evidence of diverticulitis.   Aortic Atherosclerosis (ICD10-I70.0).     05/05/2020 Imaging   CT Chest IMPRESSION: Bulky annular soft tissue mass involving the distal transverse colon  with extension into adjacent pericolonic fat, consistent with primary colon carcinoma.   No evidence of metastatic disease.   Colonic diverticulosis. No radiographic evidence of diverticulitis.   Aortic  Atherosclerosis (ICD10-I70.0).     05/28/2020 Initial Diagnosis   Cancer of left colon ([place])   05/28/2020 Cancer Staging   Staging form: Colon and Rectum, AJCC 8th Edition - Pathologic stage from 05/28/2020: Stage IIA (pT3, pN0, cM0) - Signed by Truitt Merle, MD on 06/19/2020 Stage prefix: Initial diagnosis Total positive nodes: 0 Histologic grading system: 4 grade system Histologic grade (G): G3 Residual tumor (R): R0 - None    05/28/2020 Surgery   XI ROBOT ASSISTED RESECTION OF SPLENIC FLEXURE by Dr Marcello Moores    FINAL MICROSCOPIC DIAGNOSIS:   A. COLON, SPLENIC FLEXURE, RESECTION:  - Invasive adenocarcinoma, poorly differentiated, spanning 9.7 cm.  - Tumor invades through muscularis propria into pericolonic tissue.  - Resection margins are negative.  - Tubular adenoma (X1).  - Sessile serrate polyp without dysplasia (x2).  - Diverticulosis.  - Polypectomy scar.  - Twenty-four of twenty-four lymph nodes negative for carcinoma (0/24).  - See oncology table.    07/07/2020 Miscellaneous   Guardant Reveal ctDNA positive MSI high   07/30/2020 Genetic Testing   Negative genetic testing:  No pathogenic variants detected on the [employer] CancerNext-Expanded + RNAinsight panel. The report date is 07/30/2020.   The CancerNext-Expanded + RNAinsight gene panel offered by [employer] and includes sequencing and rearrangement analysis for the following 77 genes: AIP, ALK, APC, ATM, AXIN2, BAP1, BARD1, BLM, BMPR1A, BRCA1, BRCA2, BRIP1, CDC73, CDH1, CDK4, CDKN1B, CDKN2A, CHEK2, CTNNA1, DICER1, FANCC, FH, FLCN, GALNT12, KIF1B, LZTR1, MAX, MEN1, MET, MLH1, MSH2, MSH3, MSH6, MUTYH, NBN, NF1, NF2, NTHL1, PALB2, PHOX2B, PMS2, POT1, PRKAR1A, PTCH1, PTEN, RAD51C, RAD51D, RB1, RECQL, RET, SDHA, SDHAF2, SDHB, SDHC, SDHD, SMAD4, SMARCA4, SMARCB1, SMARCE1, STK11, SUFU, TMEM127, TP53, TSC1, TSC2, VHL and XRCC2 (sequencing and deletion/duplication); EGFR, EGLN1, HOXB13, KIT, MITF, PDGFRA, POLD1 and POLE (sequencing  only); EPCAM and GREM1 (deletion/duplication only). RNA data is routinely analyzed for use in variant interpretation for all genes.   08/31/2020 Imaging   CT CAP w/o contrast  IMPRESSION: 1. No specific findings of active malignancy on today's noncontrast CT. There has been interval partial colectomy involving the tumor site with reanastomosis. Some faint stranding in the adjacent omentum is probably from scarring or fat necrosis, less likely due to early tumor given the lack of overt nodularity. Surveillance imaging is likely indicated, and PET-CT may have a role in imaging follow up. 2. Several small pulmonary nodules, in addition to a larger mixed density nodule in the left upper lobe. Surveillance is recommended. 3. Other imaging findings of potential clinical significance: Aortic Atherosclerosis (ICD10-I70.0). Coronary atherosclerosis. Mitral valve calcification. Small type 1 hiatal hernia. Splenectomy with small amount of regenerative splenic tissue as well as a venous varix arising from the splenic vein. Hyperdense left kidney lower pole exophytic homogeneous lesion, probably a complex cyst. Sigmoid colon diverticulosis. Multilevel lumbar spondylosis and degenerative disc disease.   10/02/2020 -  Chemotherapy   Patient is on Treatment Plan : COLORECTAL Pembrolizumab q21d        INTERVAL HISTORY:  Briana Ochoa was contacted for a follow up of colon cancer. She was last seen by me on 12/25/20.  She completed a course of steroids I prescribed on last visit, however her taste change and mild sensitivity has not improved.  She denies any other new symptoms.  She has appointment with her  neurosurgeon on Monday and had a brain image lately for aneurysm follow-up.   All other systems were reviewed with the patient and are negative.  MEDICAL HISTORY:  Past Medical History:  Diagnosis Date   Allergy    seasonal allergies   Anemia    on meds   Aortic atherosclerosis (H[facility])     Bladder cancer ([place]) 2020   Bladder tumor    Bloody diarrhea 09/29/2017   Breast cancer ([place]) 2015   Left Breast Cancer   Cataract    bilateral -sx   CKD (chronic kidney disease), stage III ([place])    DM related   Colon cancer ([place])    Diabetes mellitus type 2, diet-controlled ([place])    diet controlled   Dyspnea 09/28/2017   Dysuria    Family history of adverse reaction to anesthesia    aunt- N/V    Family history of breast cancer    Family history of kidney cancer    Family history of throat cancer    Family history of uterine cancer    Fatigue 09/28/2017   Frequency of urination    Grade I diastolic dysfunction 51/03/5850   Noted on ECHO   Gross hematuria 09/29/2017   Heart murmur    since rheumatic fever as child   Hematuria    History of cardiomegaly 02/12/2005   Mild, noted on CXR   History of CVA (cerebrovascular accident) 08/05/2014   post op cerebral angiogram, cerebral thrombosis w/ cerebral infarction;  11-02-2017  per pt no residuals   History of malignant melanoma of skin 12/2015   s/p wide local excision right lower leg (per pt localized)   History of rheumatic fever as a child    Hyperlipidemia    on meds   Hypertension    on meds   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   Sigmoid diverticulosis    Weakness 09/28/2017   Wears dentures    upper   Wears glasses    Wears hearing aid in  both ears     SURGICAL HISTORY: Past Surgical History:  Procedure Laterality Date   BREAST LUMPECTOMY Left 2015   BREAST LUMPECTOMY WITH NEEDLE LOCALIZATION Left 09/19/2013   Procedure: LEFT BREAST LUMPECTOMY WITH DOUBLE WIRE BRACKETED  NEEDLE LOCALIZATION;  Surgeon: [name], MD;  Location: [facility];  Service: General;  Laterality: Left;   CATARACT EXTRACTION W/ INTRAOCULAR LENS  IMPLANT, BILATERAL  06/2015   CESAREAN SECTION  1964   CHOLECYSTECTOMY OPEN  1970s   CYSTOSCOPY W/ URETERAL STENT PLACEMENT Left 11/07/2017   Procedure: CYSTOSCOPY WITH RETROGRADE PYELOGRAM/URETERAL STENT PLACEMENT;  Surgeon: Cleon Gustin, MD;  Location: [facility];  Service: Urology;  Laterality: Left;   CYSTOSCOPY W/ URETERAL STENT PLACEMENT Bilateral 04/27/2018   Procedure: CYSTOSCOPY WITH RETROGRADE PYELOGRAM;  Surgeon: Cleon Gustin, MD;  Location: [facility];  Service: Urology;  Laterality: Bilateral;   FINGER SURGERY     lt thumb  from dog bite   IR  NEPHROURETERAL CATH PLACE LEFT  04/28/2018   MELANOMA EXCISION Right 12/28/2015   Procedure: EXCISION MELANOMA RIGHT LOWER LEG;  Surgeon: Fanny Skates, MD;  Location: [place];  Service: General;  Laterality: Right;   RADIOLOGY WITH ANESTHESIA N/A 01/09/2014   Procedure: Embolization;  Surgeon: Consuella Lose, MD;  Location: [place];  Service: Radiology;  Laterality: N/A;   SPLENECTOMY, TOTAL  2009 approx.   splen injury due to fall off horse   TONSILLECTOMY  child   TRANSURETHRAL RESECTION OF BLADDER TUMOR N/A 11/07/2017   Procedure: TRANSURETHRAL RESECTION OF BLADDER TUMOR (TURBT), POSSIBLE STENT PLACEMENT;  Surgeon: Cleon Gustin, MD;  Location: [facility];  Service: Urology;  Laterality: N/A;   TRANSURETHRAL RESECTION OF BLADDER TUMOR N/A 12/14/2017   Procedure: TRANSURETHRAL RESECTION OF BLADDER TUMOR (TURBT);  Surgeon: Cleon Gustin, MD;  Location[facility];  Service: Urology;  Laterality: N/A;  [place] TUMOR N/A 04/27/2018   Procedure: TRANSURETHRAL RESECTION OF BLADDER TUMOR (TURBT);  Surgeon: Cleon Gustin, MD;  Location: [facility];  Service: Urology;  Laterality: N/A;  100 MINS    I have reviewed the social history and family history with the patient and they are unchanged from previous note.  ALLERGIES:  is allergic to feraheme [ferumoxytol].  MEDICATIONS:  Current Outpatient Medications  Medication Sig Dispense Refill   acetaminophen (TYLENOL) 500 MG tablet Take 1,000 mg by mouth daily.     capecitabine (XELODA) 500 MG tablet Take 2 tablets (1000 mg total) by mouth in morning and 3 tablets (1500 mg total) by mouth in evening. Take within 30 minutes after meals. Take for 14 days on and 7 days off. Repeat every 21 days. 70 tablet 0   glucose blood (TRUE METRIX BLOOD GLUCOSE TEST) test strip Use as instructed to test blood sugar once daily E08.638 100 each 2   lisinopril (ZESTRIL) 20 MG tablet TAKE 1 TABLET EVERY DAY 90 tablet 3   ondansetron (ZOFRAN) 8 MG tablet Take 1 tablet (8 mg total) by mouth every 8 (eight) hours as needed for nausea or vomiting. 20 tablet 0   pravastatin (PRAVACHOL) 10 MG tablet TAKE 1 TABLET EVERY DAY (STOP PRAVASTATIN 20MG) 90 tablet 3   predniSONE (STERAPRED UNI-PAK 21 TAB) 10 MG (21) TBPK tablet Take 4 tabs daily for 2 days then drip one tab every 2 days until finish. 21 tablet 0   No current facility-administered medications for this visit.    PHYSICAL EXAMINATION: ECOG PERFORMANCE STATUS: 1 - Symptomatic but completely ambulatory  There were no vitals filed for this visit. Wt Readings from Last 3 Encounters:  12/25/20 170 lb 9.6 oz (77.4 kg)  12/04/20 175 lb 6.4 oz (79.6 kg)  11/13/20 173 lb 6.4 oz (78.7 kg)     No vitals taken today, Exam not performed today  LABORATORY DATA:  I have reviewed the data as listed CBC Latest Ref  Rng & Units 12/25/2020 12/04/2020 11/13/2020  WBC 4.0 - 10.5 K/uL 10.4 10.2 10.7(H)  Hemoglobin 12.0 - 15.0 g/dL 15.1(H) 13.7 14.7  Hematocrit 36.0 - 46.0 % 46.5(H) 40.8 45.4  Platelets 150 - 400 K/uL 267 297 341     CMP Latest Ref Rng & Units 12/25/2020 12/04/2020 11/13/2020  Glucose 70 - 99 mg/dL 90 135(H)  103(H)  BUN 8 - 23 mg/dL 23 24(H) 29(H)  Creatinine 0.44 - 1.00 mg/dL 1.47(H) 1.68(H) 1.45(H)  Sodium 135 - 145 mmol/L 140 140 140  Potassium 3.5 - 5.1 mmol/L 4.7 4.5 4.6  Chloride 98 - 111 mmol/L 107 109 107  CO2 22 - 32 mmol/L _0 Calcium 8.9 - 10.3 mg/dL 9.3 9.3 9.7  Total Protein 6.5 - 8.1 g/dL 7.4 7.0 7.4  Total Bilirubin 0.3 - 1.2 mg/dL <0.2(L) 0.4 0.3  Alkaline Phos 38 - 126 U/L 102 108 104  AST 15 - 41 U/L 15 16 14(L)  ALT 0 - 44 U/L _1 RADIOGRAPHIC STUDIES: I have personally reviewed the radiological images as listed and agreed with the findings in the report. No results found.    No orders of the defined types were placed in this encounter.  All questions were answered. The patient knows to call the clinic with any problems, questions or concerns. No barriers to learning was detected. The total time spent in the appointment was 22 minutes.     Truitt Merle, MD 01/22/2021   I, Wilburn Mylar, am acting as scribe for Truitt Merle, MD.   I have reviewed the above documentation for accuracy and completeness, and I agree with the above.

## 2021-01-24 ENCOUNTER — Other Ambulatory Visit: Payer: Self-pay

## 2021-01-25 DIAGNOSIS — I10 Essential (primary) hypertension: Secondary | ICD-10-CM | POA: Diagnosis not present

## 2021-01-25 DIAGNOSIS — I671 Cerebral aneurysm, nonruptured: Secondary | ICD-10-CM | POA: Diagnosis not present

## 2021-01-25 DIAGNOSIS — Z6829 Body mass index (BMI) 29.0-29.9, adult: Secondary | ICD-10-CM | POA: Diagnosis not present

## 2021-01-29 ENCOUNTER — Telehealth: Payer: Self-pay | Admitting: Hematology

## 2021-01-29 NOTE — Telephone Encounter (Signed)
Sch per 12/16 los, left msg

## 2021-02-03 ENCOUNTER — Other Ambulatory Visit: Payer: Self-pay

## 2021-02-09 ENCOUNTER — Other Ambulatory Visit: Payer: Self-pay

## 2021-02-09 DIAGNOSIS — C186 Malignant neoplasm of descending colon: Secondary | ICD-10-CM

## 2021-02-09 NOTE — Progress Notes (Signed)
..  Patient is receiving Assistance Medication - Supplied Externally. Medication: Keytruda (pembrolizumab) Manufacture: [employer] Approval Dates: Approved from 02/09/2021 until 02/06/2022. ID: [id number] Reason: Insurance Denial  First DOS: 02/10/2021  *Re-enrollment for 2023.  Marland KitchenJuan Quam, CPhT IV Drug Replacement Specialist [place] Phone: [phone]

## 2021-02-10 ENCOUNTER — Inpatient Hospital Stay: Payer: Medicare PPO | Attending: Hematology

## 2021-02-10 ENCOUNTER — Inpatient Hospital Stay: Payer: Medicare PPO | Admitting: Hematology

## 2021-02-10 ENCOUNTER — Other Ambulatory Visit: Payer: Self-pay

## 2021-02-10 ENCOUNTER — Inpatient Hospital Stay: Payer: Medicare PPO

## 2021-02-10 VITALS — BP 122/68 | HR 66 | Temp 98.0°F | Resp 17 | Ht 64.0 in | Wt 172.7 lb

## 2021-02-10 DIAGNOSIS — D0512 Intraductal carcinoma in situ of left breast: Secondary | ICD-10-CM

## 2021-02-10 DIAGNOSIS — C185 Malignant neoplasm of splenic flexure: Secondary | ICD-10-CM | POA: Diagnosis present

## 2021-02-10 DIAGNOSIS — C186 Malignant neoplasm of descending colon: Secondary | ICD-10-CM | POA: Diagnosis not present

## 2021-02-10 DIAGNOSIS — Z5112 Encounter for antineoplastic immunotherapy: Secondary | ICD-10-CM | POA: Diagnosis present

## 2021-02-10 LAB — CBC WITH DIFFERENTIAL (CANCER CENTER ONLY)
Abs Immature Granulocytes: 0.02 10*3/uL (ref 0.00–0.07)
Basophils Absolute: 0.1 10*3/uL (ref 0.0–0.1)
Basophils Relative: 1 %
Eosinophils Absolute: 0.3 10*3/uL (ref 0.0–0.5)
Eosinophils Relative: 4 %
HCT: 44.3 % (ref 36.0–46.0)
Hemoglobin: 14.6 g/dL (ref 12.0–15.0)
Immature Granulocytes: 0 %
Lymphocytes Relative: 20 %
Lymphs Abs: 1.7 10*3/uL (ref 0.7–4.0)
MCH: 32 pg (ref 26.0–34.0)
MCHC: 33 g/dL (ref 30.0–36.0)
MCV: 97.1 fL (ref 80.0–100.0)
Monocytes Absolute: 1.1 10*3/uL — ABNORMAL HIGH (ref 0.1–1.0)
Monocytes Relative: 13 %
Neutro Abs: 5.3 10*3/uL (ref 1.7–7.7)
Neutrophils Relative %: 62 %
Platelet Count: 310 10*3/uL (ref 150–400)
RBC: 4.56 MIL/uL (ref 3.87–5.11)
RDW: 14.6 % (ref 11.5–15.5)
WBC Count: 8.6 10*3/uL (ref 4.0–10.5)
nRBC: 0 % (ref 0.0–0.2)

## 2021-02-10 LAB — CMP (CANCER CENTER ONLY)
ALT: 8 U/L (ref 0–44)
AST: 12 U/L — ABNORMAL LOW (ref 15–41)
Albumin: 3.9 g/dL (ref 3.5–5.0)
Alkaline Phosphatase: 80 U/L (ref 38–126)
Anion gap: 8 (ref 5–15)
BUN: 29 mg/dL — ABNORMAL HIGH (ref 8–23)
CO2: 24 mmol/L (ref 22–32)
Calcium: 9.2 mg/dL (ref 8.9–10.3)
Chloride: 108 mmol/L (ref 98–111)
Creatinine: 1.66 mg/dL — ABNORMAL HIGH (ref 0.44–1.00)
GFR, Estimated: 31 mL/min — ABNORMAL LOW (ref >60)
Glucose, Bld: 106 mg/dL — ABNORMAL HIGH (ref 70–99)
Potassium: 4.5 mmol/L (ref 3.5–5.1)
Sodium: 140 mmol/L (ref 135–145)
Total Bilirubin: 0.3 mg/dL (ref 0.3–1.2)
Total Protein: 6.8 g/dL (ref 6.5–8.1)

## 2021-02-10 LAB — CEA (IN HOUSE-CHCC): CEA (CHCC-In House): 4.15 ng/mL (ref 0.00–5.00)

## 2021-02-10 MED ORDER — SODIUM CHLORIDE 0.9 % IV SOLN: Freq: Once | INTRAVENOUS | Status: AC

## 2021-02-10 MED ORDER — SODIUM CHLORIDE 0.9 % IV SOLN
200.0000 mg | Freq: Once | INTRAVENOUS | Status: AC
  Administered 2021-02-10: 200 mg via INTRAVENOUS
  Filled 2021-02-10: qty 8

## 2021-02-10 NOTE — Progress Notes (Signed)
[place]   [phone] Fax:([phone]) [phone]   Clinic Follow up Note   Patient Care Team: Nche, [name], NP as PCP - General (Internal Medicine) Fanny Skates, MD as Consulting Physician (General Surgery) Thea Silversmith, MD as Consulting Physician (Radiation Oncology) Nicholas Lose, MD as Consulting Physician (Hematology and Oncology) Garvin Fila, MD as Consulting Physician (Neurology) Consuella Lose, MD as Consulting Physician (Neurosurgery) Katy Apo, MD as Consulting Physician (Ophthalmology) Jonnie Finner, RN (Inactive) as Oncology Nurse Navigator Truitt Merle, MD as Consulting Physician (Hematology and Oncology) Leighton Ruff, MD as Consulting Physician (General Surgery)  Date of Service:  02/10/2021  CHIEF COMPLAINT: f/u of colon cancer  CURRENT THERAPY:  Xeloda and Keytruda, started 09/21/20. Held 12/25/20-02/09/21  ASSESSMENT & PLAN:  Briana Ochoa is a 81 y.o. female with   1. Colon cancer in Splenic Flexure, pT3N0M0, Stage II, MSI-High -she was diagnosed on 04/23/20 by Colonoscopy  -resection with Dr Marcello Moores on 05/28/20 showed 9.7cm mass in splenic flexure with invasive adenocarcinoma, poorly differentiated, negative margins, negative LNs.  -GuardantReveal on 07/07/20 was positive, which likely indicates microscopic disease and high risk of recurrence. -PET scan was recommended but denied by her insurance. She proceeded with CT CAP w/o contrast on 08/31/20 showing no specific findings of active malignancy.  -She began Xeloda and Keytruda on 09/21/20.  -She has tolerated treatment well overall. She was found to have increased Cr of 1.7 on 10/23/20, and we held her Xeloda. Xeloda was restarted on cycle 3, and she developed mild fatigue and taste change. Due to this and low EGFR, we have held Xeloda since cycle 4. -given her persistent mouth symptoms (taste change and dry lips), Beryle Flock has been held after cycle 4 -Her  mouth symptom has not improved with steroid and holding treatment, may not be related to her treatment. Given no change, we will restart treatment today.   2. Symptom Management: taste change, mouth tenderness -she has experienced taste changes and mouth burning without ulceration. -symptoms persisted despite holding Xeloda and Keytruda. -No improvement with a course of steroid, indicating this is probably not related to [facility] -I will get a brain MRI w wo contrast to rule out brain mets,although my suspicion is not high  -f/u with neurology as needed    3. H/o DVT 04/2020 -she was found to have an elevated D-dimer back on 04/08/20. She was found to have DVT in the RLE. This was still present on 04/28/20 and she was started on Eliquis shortly after. -I discussed that her chemo can increase her risk of blood clots. She notes the Eliquis is expensive. I advised her to switch to baby aspirin.   4. H/o of Left breast DCIS, H/o Bladder cancer, H/o Melanoma in 2017 of right lower leg -Diagnosed with DCIS in 2015, S/p left lumpectomy, Adjuvant Radiation and 5 years of Anastrozole. Treated by Dr Lindi Adie. -Diagnosed with bladder cancer in 02/2018, early stage, S/p surgery. Followed by Dr. Alyson Ingles -She will continue surveillance. Mammogram scheduled for 02/11/21.   5. Genetic Testing -Given her personably history of cancer and family history of cancer, I recommend genetic testing.  -performed 07/07/20, results negative   6. Comorbidites: CKD, DM, Heart murmur, H/o mini stroke from Brain aneurysm, HTN, Osteopenia -Continue medications and to f/u with other physicians for management.      PLAN:  -restart Keytruda today -restart Xeloda 2 tabs in morning and 3 tabs in evening for 14 days  -f/u and Keytruda in 3  weeks, with lab and CT CAP and brain MRI several days before   No problem-specific Assessment & Plan notes found for this encounter.   SUMMARY OF ONCOLOGIC HISTORY: Oncology History Overview  Note  Cancer Staging Cancer of left colon [facility]) Staging form: Colon and Rectum, AJCC 8th Edition - Pathologic stage from 05/28/2020: Stage IIA (pT3, pN0, cM0) - Signed by Truitt Merle, MD on 06/19/2020 Stage prefix: Initial diagnosis Total positive nodes: 0 Histologic grading system: 4 grade system Histologic grade (G): G3 Residual tumor (R): R0 - None  Ductal carcinoma in situ (DCIS) of left breast Staging form: Breast, AJCC 7th Edition - Clinical: Stage 0 (Tis (DCIS), N0, cM0) - Unsigned Specimen type: Core Needle Biopsy Histopathologic type: 9932 Laterality: Left Staging comments: Staged at breast conference 7.22.15  - Pathologic: Stage 0 (Tis (DCIS)(2), N0, cM0) - Unsigned Specimen type: Core Needle Biopsy Histopathologic type: 9932 Laterality: Left Tumor size (mm): 3 Multiple tumors: Yes Number of tumors: 2 Method of lymph node assessment: Clinical Method of detection of distant metastases: Clinical Residual tumor (R): R0 - None Estrogen receptor status: Positive Progesterone receptor status: Positive    Ductal carcinoma in situ (DCIS) of left breast  08/20/2013 Initial Diagnosis   Left breast 12:00: DCIS with calcifications, yet 100%, PR 100%; 09/04/2013 second group of calcifications also biopsied proven to be DCIS ER/PR positive   09/19/2013 Surgery   Left breast lumpectomy DCIS 2 foci margins negative, 0.2 and 0.3 cm ER 100% PR 100%   10/08/2013 - 11/12/2013 Radiation Therapy   Adjuvant radiation therapy   11/12/2013 - 2020 Anti-estrogen oral therapy   Anastrozole 1 mg daily plan is for 5 years   01/09/2014 - 01/10/2014 Hospital Admission   Pipeline embolization 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 The report date is 07/30/2020.   The CancerNext-Expanded + RNAinsight gene panel offered by [employer] and includes sequencing and  rearrangement analysis for the following 77 genes: AIP, ALK, APC, ATM, AXIN2, BAP1, BARD1, BLM, BMPR1A, BRCA1, BRCA2, BRIP1, CDC73, CDH1, CDK4, CDKN1B, CDKN2A, CHEK2, CTNNA1, DICER1, FANCC, FH, FLCN, GALNT12, KIF1B, LZTR1, MAX, MEN1, MET, MLH1, MSH2, MSH3, MSH6, MUTYH, NBN, NF1, NF2, NTHL1, PALB2, PHOX2B, PMS2, POT1, PRKAR1A, PTCH1, PTEN, RAD51C, RAD51D, RB1, RECQL, RET, SDHA, SDHAF2, SDHB, SDHC, SDHD, SMAD4, SMARCA4, SMARCB1, SMARCE1, STK11, SUFU, TMEM127, TP53, TSC1, TSC2, VHL and XRCC2 (sequencing and deletion/duplication); EGFR, EGLN1, HOXB13, KIT, MITF, PDGFRA, POLD1 and POLE (sequencing only); EPCAM and GREM1 (deletion/duplication only). RNA data is routinely analyzed for use in variant interpretation for all genes.   Bladder cancer ([place])  02/09/2018 Initial Diagnosis   Bladder cancer ([place])   07/30/2020 Genetic Testing   Negative genetic testing:  No pathogenic variants detected on the [employer] CancerNext-Expanded + RNAinsight panel. The report date is 07/30/2020.   The CancerNext-Expanded + RNAinsight gene panel offered by [employer] and includes sequencing and rearrangement analysis for the following 77 genes: AIP, ALK, APC, ATM, AXIN2, BAP1, BARD1, BLM, BMPR1A, BRCA1, BRCA2, BRIP1, CDC73, CDH1, CDK4, CDKN1B, CDKN2A, CHEK2, CTNNA1, DICER1, FANCC, FH, FLCN, GALNT12, KIF1B, LZTR1, MAX, MEN1, MET, MLH1, MSH2, MSH3, MSH6, MUTYH, NBN, NF1, NF2, NTHL1, PALB2, PHOX2B, PMS2, POT1, PRKAR1A, PTCH1, PTEN, RAD51C, RAD51D, RB1, RECQL, RET, SDHA, SDHAF2, SDHB, SDHC, SDHD, SMAD4, SMARCA4, SMARCB1, SMARCE1, STK11, SUFU, TMEM127, TP53, TSC1, TSC2, VHL and XRCC2 (sequencing and deletion/duplication); EGFR, EGLN1, HOXB13, KIT, MITF, PDGFRA, POLD1 and POLE (sequencing only);  EPCAM and GREM1 (deletion/duplication only). RNA data is routinely analyzed for use in variant interpretation for all genes.   08/31/2020 Imaging   CT CAP w/o contrast  IMPRESSION: 1. No specific findings of active malignancy on today's  noncontrast CT. There has been interval partial colectomy involving the tumor site with reanastomosis. Some faint stranding in the adjacent omentum is probably from scarring or fat necrosis, less likely due to early tumor given the lack of overt nodularity. Surveillance imaging is likely indicated, and PET-CT may have a role in imaging follow up. 2. Several small pulmonary nodules, in addition to a larger mixed density nodule in the left upper lobe. Surveillance is recommended. 3. Other imaging findings of potential clinical significance: Aortic Atherosclerosis (ICD10-I70.0). Coronary atherosclerosis. Mitral valve calcification. Small type 1 hiatal hernia. Splenectomy with small amount of regenerative splenic tissue as well as a venous varix arising from the splenic vein. Hyperdense left kidney lower pole exophytic homogeneous lesion, probably a complex cyst. Sigmoid colon diverticulosis. Multilevel lumbar spondylosis and degenerative disc disease.   Cancer of left colon ([place])  04/23/2020 Procedure   Upper Endoscopy and Colonoscopy by Dr Carlean Purl IMPRESSION Erythematous mucosa in the antrum. Biopsied. - Small hiatal hernia. - Gastroesophageal flap valve classified as Hill Grade IV (no fold, wide open lumen, hiatal hernia present). - The examination was otherwise normal.   IMPRESSION - Decreased sphincter tone found on digital rectal exam. - Malignant partially obstructing tumor in the proximal descending colon. Biopsied. Tattooed. - Three 5 to 20 mm polyps in the sigmoid colon, removed piecemeal using a cold snare. Resected and retrieved. - Severe diverticulosis in the sigmoid colon. - I left several diminutive descending (proximal to tattoo), sigmoid and rectal polyps - The examination was otherwise normal on direct and retroflexion views.     04/23/2020 Initial Diagnosis   Diagnosis 1. Descending Colon Polyp, mass - ADENOCARCINOMA. 2. Sigmoid Colon Polyp, (3) - MULTIPLE  FRAGMENTS OF TUBULAR ADENOMA. - MULTIPLE FRAGMENTS OF SESSILE SERRATED POLYP WITHOUT DYSPLASIA. - NO HIGH GRADE DYSPLASIA OR MALIGNANCY. Microscopic Comment 1. Dr. Vic Ripper has reviewed the case. Dr. Carlean Purl was notified on 04/24/2020. 2. Despite being left sided the fragments have features of a sessile serrated polyp, as opposed to, hyperplastic polyp.   04/27/2020 Imaging   CT AP IMPRESSION: Bulky annular soft tissue mass involving the distal transverse colon with extension into adjacent pericolonic fat, consistent with primary colon carcinoma.   No evidence of metastatic disease.   Colonic diverticulosis. No radiographic evidence of diverticulitis.   Aortic Atherosclerosis (ICD10-I70.0).     05/05/2020 Imaging   CT Chest IMPRESSION: Bulky annular soft tissue mass involving the distal transverse colon with extension into adjacent pericolonic fat, consistent with primary colon carcinoma.   No evidence of metastatic disease.   Colonic diverticulosis. No radiographic evidence of diverticulitis.   Aortic Atherosclerosis (ICD10-I70.0).     05/28/2020 Initial Diagnosis   Cancer of left colon ([place])   05/28/2020 Cancer Staging   Staging form: Colon and Rectum, AJCC 8th Edition - Pathologic stage from 05/28/2020: Stage IIA (pT3, pN0, cM0) - Signed by Truitt Merle, MD on 06/19/2020 Stage prefix: Initial diagnosis Total positive nodes: 0 Histologic grading system: 4 grade system Histologic grade (G): G3 Residual tumor (R): R0 - None    05/28/2020 Surgery   XI ROBOT ASSISTED RESECTION OF SPLENIC FLEXURE by Dr Marcello Moores    FINAL MICROSCOPIC DIAGNOSIS:   A. COLON, SPLENIC FLEXURE, RESECTION:  - Invasive adenocarcinoma, poorly differentiated, spanning 9.7 cm.  -  Tumor invades through muscularis propria into pericolonic tissue.  - Resection margins are negative.  - Tubular adenoma (X1).  - Sessile serrate polyp without dysplasia (x2).  - Diverticulosis.  - Polypectomy scar.  -  Twenty-four of twenty-four lymph nodes negative for carcinoma (0/24).  - See oncology table.    07/07/2020 Miscellaneous   Guardant Reveal ctDNA positive MSI high   07/30/2020 Genetic Testing   Negative genetic testing:  No pathogenic variants detected on the [employer] CancerNext-Expanded + RNAinsight panel. The report date is 07/30/2020.   The CancerNext-Expanded + RNAinsight gene panel offered by [employer] and includes sequencing and rearrangement analysis for the following 77 genes: AIP, ALK, APC, ATM, AXIN2, BAP1, BARD1, BLM, BMPR1A, BRCA1, BRCA2, BRIP1, CDC73, CDH1, CDK4, CDKN1B, CDKN2A, CHEK2, CTNNA1, DICER1, FANCC, FH, FLCN, GALNT12, KIF1B, LZTR1, MAX, MEN1, MET, MLH1, MSH2, MSH3, MSH6, MUTYH, NBN, NF1, NF2, NTHL1, PALB2, PHOX2B, PMS2, POT1, PRKAR1A, PTCH1, PTEN, RAD51C, RAD51D, RB1, RECQL, RET, SDHA, SDHAF2, SDHB, SDHC, SDHD, SMAD4, SMARCA4, SMARCB1, SMARCE1, STK11, SUFU, TMEM127, TP53, TSC1, TSC2, VHL and XRCC2 (sequencing and deletion/duplication); EGFR, EGLN1, HOXB13, KIT, MITF, PDGFRA, POLD1 and POLE (sequencing only); EPCAM and GREM1 (deletion/duplication only). RNA data is routinely analyzed for use in variant interpretation for all genes.   08/31/2020 Imaging   CT CAP w/o contrast  IMPRESSION: 1. No specific findings of active malignancy on today's noncontrast CT. There has been interval partial colectomy involving the tumor site with reanastomosis. Some faint stranding in the adjacent omentum is probably from scarring or fat necrosis, less likely due to early tumor given the lack of overt nodularity. Surveillance imaging is likely indicated, and PET-CT may have a role in imaging follow up. 2. Several small pulmonary nodules, in addition to a larger mixed density nodule in the left upper lobe. Surveillance is recommended. 3. Other imaging findings of potential clinical significance: Aortic Atherosclerosis (ICD10-I70.0). Coronary atherosclerosis. Mitral valve calcification.  Small type 1 hiatal hernia. Splenectomy with small amount of regenerative splenic tissue as well as a venous varix arising from the splenic vein. Hyperdense left kidney lower pole exophytic homogeneous lesion, probably a complex cyst. Sigmoid colon diverticulosis. Multilevel lumbar spondylosis and degenerative disc disease.   10/02/2020 -  Chemotherapy   Patient is on Treatment Plan : COLORECTAL Pembrolizumab q21d        INTERVAL HISTORY:  Briana Ochoa is here for a follow up of colon cancer. She was last seen by me on 12/25/20 in the office, with phone visit in the interim. She presents to the clinic alone. She reports continued taste change, stating that it feels like she has a coating in her mouth.   All other systems were reviewed with the patient and are negative.  MEDICAL HISTORY:  Past Medical History:  Diagnosis Date   Allergy    seasonal allergies   Anemia    on meds   Aortic atherosclerosis (HCC)    Bladder cancer ([place]) 2020   Bladder tumor    Bloody diarrhea 09/29/2017   Breast cancer ([place]) 2015   Left Breast Cancer   Cataract    bilateral -sx   CKD (chronic kidney disease), stage III (HCC)    DM related   Colon cancer ([place])    Diabetes mellitus type 2, diet-controlled ([place])    diet controlled   Dyspnea 09/28/2017   Dysuria    Family history of adverse reaction to anesthesia    aunt- N/V    Family history of breast cancer    Family  history of kidney cancer    Family history of throat cancer    Family history of uterine cancer    Fatigue 09/28/2017   Frequency of urination    Grade I diastolic dysfunction 82/95/6213   Noted on ECHO   Gross hematuria 09/29/2017   Heart murmur    since rheumatic fever as child   Hematuria    History of cardiomegaly 02/12/2005   Mild, noted on CXR   History of CVA (cerebrovascular accident) 08/05/2014   post op cerebral angiogram, cerebral thrombosis w/ cerebral infarction;  11-02-2017  per pt no residuals    History of malignant melanoma of skin 12/2015   s/p wide local excision right lower leg (per pt localized)   History of rheumatic fever as a child    Hyperlipidemia    on meds   Hypertension    on meds   Intracranial aneurysm dx 07/ 2015   paraophthalmic RICA aneurysm /   s/p  Pipeline embolization right ICA 01-09-2014   Jaundice 09/28/2017   Malignant neoplasm of central portion of left breast in female, estrogen receptor positive [facility]) oncologist-  dr Lindi Adie   dx 07/ 2015--- DCIS, ER/PR positive-- s/p  left breast lumpectomy 09-19-2013,  completed radiation 11-12-2013,  started arimidex 11-12-2013   Melanoma ([place])    righ tleg    OA (osteoarthritis)    knees, hands, R shoulder   Osteopenia 10/16/2012   Diffuse   Personal history of radiation therapy    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   Sigmoid diverticulosis    Weakness 09/28/2017   Wears dentures    upper   Wears glasses    Wears hearing aid in both ears     SURGICAL HISTORY: Past Surgical History:  Procedure Laterality Date   BREAST LUMPECTOMY Left 2015   BREAST LUMPECTOMY WITH NEEDLE LOCALIZATION Left 09/19/2013   Procedure: LEFT BREAST LUMPECTOMY WITH DOUBLE WIRE BRACKETED  NEEDLE LOCALIZATION;  Surgeon: [name], MD;  Location: [place];  Service: General;  Laterality: Left;   CATARACT EXTRACTION W/ INTRAOCULAR LENS  IMPLANT, BILATERAL  06/2015   CESAREAN SECTION  1964   CHOLECYSTECTOMY OPEN  1970s   CYSTOSCOPY W/ URETERAL STENT PLACEMENT Left 11/07/2017   Procedure: CYSTOSCOPY WITH RETROGRADE PYELOGRAM/URETERAL STENT PLACEMENT;  Surgeon: Cleon Gustin, MD;  Location: [facility];  Service: Urology;  Laterality: Left;   CYSTOSCOPY W/ URETERAL STENT PLACEMENT Bilateral 04/27/2018   Procedure: CYSTOSCOPY WITH RETROGRADE PYELOGRAM;  Surgeon: Cleon Gustin, MD;  Location: [facility];  Service: Urology;  Laterality: Bilateral;   FINGER SURGERY     lt thumb  from  dog bite   IR  NEPHROURETERAL CATH PLACE LEFT  04/28/2018   MELANOMA EXCISION Right 12/28/2015   Procedure: EXCISION MELANOMA RIGHT LOWER LEG;  Surgeon: Fanny Skates, MD;  Location: [place];  Service: General;  Laterality: Right;   RADIOLOGY WITH ANESTHESIA N/A 01/09/2014   Procedure: Embolization;  Surgeon: Consuella Lose, MD;  Location: [place];  Service: Radiology;  Laterality: N/A;   SPLENECTOMY, TOTAL  2009 approx.   splen injury due to fall off horse   TONSILLECTOMY  child   TRANSURETHRAL RESECTION OF BLADDER TUMOR N/A 11/07/2017   Procedure: TRANSURETHRAL RESECTION OF BLADDER TUMOR (TURBT), POSSIBLE STENT PLACEMENT;  Surgeon: Cleon Gustin, MD;  Location: [facility];  Service: Urology;  Laterality: N/A;   TRANSURETHRAL RESECTION OF BLADDER TUMOR N/A 12/14/2017  Procedure: TRANSURETHRAL RESECTION OF BLADDER TUMOR (TURBT);  Surgeon: Cleon Gustin, MD;  Location: [facility];  Service: Urology;  Laterality: N/A;  [place] TUMOR N/A 04/27/2018   Procedure: TRANSURETHRAL RESECTION OF BLADDER TUMOR (TURBT);  Surgeon: Cleon Gustin, MD;  Location: [facility];  Service: Urology;  Laterality: N/A;  64 MINS    I have reviewed the social history and family history with the patient and they are unchanged from previous note.  ALLERGIES:  is allergic to feraheme [ferumoxytol].  MEDICATIONS:  Current Outpatient Medications  Medication Sig Dispense Refill   acetaminophen (TYLENOL) 500 MG tablet Take 1,000 mg by mouth daily.     capecitabine (XELODA) 500 MG tablet Take 2 tablets (1000 mg total) by mouth in morning and 3 tablets (1500 mg total) by mouth in evening. Take within 30 minutes after meals. Take for 14 days on and 7 days off. Repeat every 21 days. 70 tablet 0   glucose blood (TRUE METRIX BLOOD GLUCOSE TEST) test strip Use as instructed to test blood sugar once daily E08.638  100 each 2   lisinopril (ZESTRIL) 20 MG tablet TAKE 1 TABLET EVERY DAY 90 tablet 3   ondansetron (ZOFRAN) 8 MG tablet Take 1 tablet (8 mg total) by mouth every 8 (eight) hours as needed for nausea or vomiting. 20 tablet 0   pravastatin (PRAVACHOL) 10 MG tablet TAKE 1 TABLET EVERY DAY (STOP PRAVASTATIN 20MG) 90 tablet 3   predniSONE (STERAPRED UNI-PAK 21 TAB) 10 MG (21) TBPK tablet Take 4 tabs daily for 2 days then drip one tab every 2 days until finish. 21 tablet 0   No current facility-administered medications for this visit.    PHYSICAL EXAMINATION: ECOG PERFORMANCE STATUS: 0 - Asymptomatic  Vitals:   02/10/21 1151  BP: 122/68  Pulse: 66  Resp: 17  Temp: 98 F (36.7 C)  SpO2: 97%   Wt Readings from Last 3 Encounters:  02/10/21 172 lb 11.2 oz (78.3 kg)  12/25/20 170 lb 9.6 oz (77.4 kg)  12/04/20 175 lb 6.4 oz (79.6 kg)     GENERAL:alert, no distress and comfortable SKIN: skin color normal, no rashes or significant lesions EYES: normal, Conjunctiva are pink and non-injected, sclera clear  NEURO: alert & oriented x 3 with fluent speech  LABORATORY DATA:  I have reviewed the data as listed CBC Latest Ref Rng & Units 02/10/2021 12/25/2020 12/04/2020  WBC 4.0 - 10.5 K/uL 8.6 10.4 10.2  Hemoglobin 12.0 - 15.0 g/dL 14.6 15.1(H) 13.7  Hematocrit 36.0 - 46.0 % 44.3 46.5(H) 40.8  Platelets 150 - 400 K/uL 310 267 297     CMP Latest Ref Rng & Units 02/10/2021 12/25/2020 12/04/2020  Glucose 70 - 99 mg/dL 106(H) 90 135(H)  BUN 8 - 23 mg/dL 29(H) 23 24(H)  Creatinine 0.44 - 1.00 mg/dL 1.66(H) 1.47(H) 1.68(H)  Sodium 135 - 145 mmol/L 140 140 140  Potassium 3.5 - 5.1 mmol/L 4.5 4.7 4.5  Chloride 98 - 111 mmol/L 108 107 109  CO2 22 - 32 mmol/L 24 24 23   Calcium 8.9 - 10.3 mg/dL 9.2 9.3 9.3  Total Protein 6.5 - 8.1 g/dL 6.8 7.4 7.0  Total Bilirubin 0.3 - 1.2 mg/dL 0.3 <0.2(L) 0.4  Alkaline Phos 38 - 126 U/L 80 102 108  AST 15 - 41 U/L 12(L) 15 16  ALT 0 - 44 U/L 8 10 8        RADIOGRAPHIC STUDIES: I  have personally reviewed the radiological images as listed and agreed with the findings in the report. No results found.    Orders Placed This Encounter  Procedures   CT CHEST ABDOMEN PELVIS WO CONTRAST    Standing Status:   Future    Standing Expiration Date:   02/10/2022    Order Specific Question:   If indicated for the ordered procedure, I authorize the administration of contrast media per Radiology protocol    Answer:   Yes    Order Specific Question:   Preferred imaging location?    Answer:   [facility]    Order Specific Question:   Is Oral Contrast requested for this exam?    Answer:   Yes, Per Radiology protocol   MR Brain W Wo Contrast    Standing Status:   Future    Standing Expiration Date:   02/10/2022    Order Specific Question:   If indicated for the ordered procedure, I authorize the administration of contrast media per Radiology protocol    Answer:   Yes    Order Specific Question:   What is the patient's sedation requirement?    Answer:   No Sedation    Order Specific Question:   Does the patient have a pacemaker or implanted devices?    Answer:   No    Order Specific Question:   Use SRS Protocol?    Answer:   No    Order Specific Question:   Preferred imaging location?    Answer:   [facility] (table limit - 550 lbs)   All questions were answered. The patient knows to call the clinic with any problems, questions or concerns. No barriers to learning was detected. The total time spent in the appointment was 30 minutes.     Truitt Merle, MD 02/10/2021   I, Wilburn Mylar, am acting as scribe for Truitt Merle, MD.   I have reviewed the above documentation for accuracy and completeness, and I agree with the above.

## 2021-02-10 NOTE — Patient Instructions (Signed)
[facility]  Discharge Instructions: ?Thank you for choosing [facility] to provide your oncology and hematology care.  ? ?If you have a lab appointment with the Cancer Center, please go directly to the Cancer Center and check in at the registration area. ?  ?Wear comfortable clothing and clothing appropriate for easy access to any Portacath or PICC line.  ? ?We strive to give you quality time with your provider. You may need to reschedule your appointment if you arrive late (15 or more minutes).  Arriving late affects you and other patients whose appointments are after yours.  Also, if you miss three or more appointments without notifying the office, you may be dismissed from the clinic at the provider?s discretion.    ?  ?For prescription refill requests, have your pharmacy contact our office and allow 72 hours for refills to be completed.   ? ?Today you received the following chemotherapy and/or immunotherapy agents: Keytruda ?  ?To help prevent nausea and vomiting after your treatment, we encourage you to take your nausea medication as directed. ? ?BELOW ARE SYMPTOMS THAT SHOULD BE REPORTED IMMEDIATELY: ?*FEVER GREATER THAN 100.4 F (38 ?C) OR HIGHER ?*CHILLS OR SWEATING ?*NAUSEA AND VOMITING THAT IS NOT CONTROLLED WITH YOUR NAUSEA MEDICATION ?*UNUSUAL SHORTNESS OF BREATH ?*UNUSUAL BRUISING OR BLEEDING ?*URINARY PROBLEMS (pain or burning when urinating, or frequent urination) ?*BOWEL PROBLEMS (unusual diarrhea, constipation, pain near the anus) ?TENDERNESS IN MOUTH AND THROAT WITH OR WITHOUT PRESENCE OF ULCERS (sore throat, sores in mouth, or a toothache) ?UNUSUAL RASH, SWELLING OR PAIN  ?UNUSUAL VAGINAL DISCHARGE OR ITCHING  ? ?Items with * indicate a potential emergency and should be followed up as soon as possible or go to the Emergency Department if any problems should occur. ? ?Please show the CHEMOTHERAPY ALERT CARD or IMMUNOTHERAPY ALERT CARD at check-in to the  Emergency Department and triage nurse. ? ?Should you have questions after your visit or need to cancel or reschedule your appointment, please contact [facility]  Dept: [phone]  and follow the prompts.  Office hours are 8:00 a.m. to 4:30 p.m. Monday - Friday. Please note that voicemails left after 4:00 p.m. may not be returned until the following business day.  We are closed weekends and major holidays. You have access to a nurse at all times for urgent questions. Please call the main number to the clinic Dept: [phone] and follow the prompts. ? ? ?For any non-urgent questions, you may also contact your provider using MyChart. We now offer e-Visits for anyone 18 and older to request care online for non-urgent symptoms. For details visit [email or web address]. ?  ?Also download the MyChart app! Go to the app store, search "MyChart", open the app, select [facility], and log in with your MyChart username and password. ? ?Due to Covid, a mask is required upon entering the hospital/clinic. If you do not have a mask, one will be given to you upon arrival. For doctor visits, patients may have 1 support person aged 18 or older with them. For treatment visits, patients cannot have anyone with them due to current Covid guidelines and our immunocompromised population.  ? ?

## 2021-02-11 ENCOUNTER — Ambulatory Visit
Admission: RE | Admit: 2021-02-11 | Discharge: 2021-02-11 | Disposition: A | Discharge status: Home / Self Care | Payer: Medicare PPO | Source: Ambulatory Visit | Attending: Nurse Practitioner | Admitting: Nurse Practitioner

## 2021-02-11 DIAGNOSIS — Z1231 Encounter for screening mammogram for malignant neoplasm of breast: Secondary | ICD-10-CM | POA: Diagnosis not present

## 2021-02-12 ENCOUNTER — Telehealth: Payer: Self-pay | Admitting: Hematology

## 2021-02-12 NOTE — Telephone Encounter (Signed)
Scheduled follow-up appointments per 1/4 los. Patient is aware. 

## 2021-02-15 ENCOUNTER — Other Ambulatory Visit (HOSPITAL_COMMUNITY): Payer: Self-pay

## 2021-02-15 ENCOUNTER — Inpatient Hospital Stay: Payer: Medicare PPO | Admitting: Hematology

## 2021-02-18 ENCOUNTER — Other Ambulatory Visit: Payer: Self-pay | Admitting: Hematology

## 2021-02-18 ENCOUNTER — Other Ambulatory Visit (HOSPITAL_COMMUNITY): Payer: Self-pay

## 2021-02-18 DIAGNOSIS — C186 Malignant neoplasm of descending colon: Secondary | ICD-10-CM

## 2021-02-18 MED ORDER — CAPECITABINE 500 MG PO TABS
ORAL_TABLET | ORAL | 0 refills | Status: DC
  Filled 2021-02-18: qty 70, 21d supply, fill #0

## 2021-02-22 ENCOUNTER — Telehealth: Payer: Self-pay | Admitting: Nurse Practitioner

## 2021-02-22 NOTE — Telephone Encounter (Signed)
Left message for patient to call back and schedule Medicare Annual Wellness Visit (AWV) in office.   If not able to come in office, please offer to do virtually or by telephone.  Left office number and my jabber [phone].  Last AWV:01/02/2017  Please schedule at anytime with Nurse Health Advisor.

## 2021-02-23 ENCOUNTER — Other Ambulatory Visit (HOSPITAL_COMMUNITY): Payer: Self-pay

## 2021-02-24 ENCOUNTER — Other Ambulatory Visit (HOSPITAL_COMMUNITY): Payer: Self-pay

## 2021-03-01 ENCOUNTER — Ambulatory Visit (HOSPITAL_COMMUNITY)
Admission: RE | Admit: 2021-03-01 | Discharge: 2021-03-01 | Disposition: A | Discharge status: Home / Self Care | Payer: Medicare PPO | Source: Ambulatory Visit | Attending: Hematology | Admitting: Hematology

## 2021-03-01 ENCOUNTER — Inpatient Hospital Stay: Payer: Medicare PPO

## 2021-03-01 ENCOUNTER — Other Ambulatory Visit: Payer: Self-pay

## 2021-03-01 ENCOUNTER — Other Ambulatory Visit (HOSPITAL_COMMUNITY): Payer: Self-pay

## 2021-03-01 DIAGNOSIS — R918 Other nonspecific abnormal finding of lung field: Secondary | ICD-10-CM | POA: Diagnosis not present

## 2021-03-01 DIAGNOSIS — I2510 Atherosclerotic heart disease of native coronary artery without angina pectoris: Secondary | ICD-10-CM | POA: Diagnosis not present

## 2021-03-01 DIAGNOSIS — K6389 Other specified diseases of intestine: Secondary | ICD-10-CM | POA: Diagnosis not present

## 2021-03-01 DIAGNOSIS — C186 Malignant neoplasm of descending colon: Secondary | ICD-10-CM | POA: Insufficient documentation

## 2021-03-01 DIAGNOSIS — I700 Atherosclerosis of aorta: Secondary | ICD-10-CM | POA: Diagnosis not present

## 2021-03-01 DIAGNOSIS — C189 Malignant neoplasm of colon, unspecified: Secondary | ICD-10-CM | POA: Diagnosis not present

## 2021-03-01 DIAGNOSIS — K5790 Diverticulosis of intestine, part unspecified, without perforation or abscess without bleeding: Secondary | ICD-10-CM | POA: Diagnosis not present

## 2021-03-01 LAB — CMP (CANCER CENTER ONLY)
ALT: 12 U/L (ref 0–44)
AST: 19 U/L (ref 15–41)
Albumin: 3.6 g/dL (ref 3.5–5.0)
Alkaline Phosphatase: 65 U/L (ref 38–126)
Anion gap: 7 (ref 5–15)
BUN: 25 mg/dL — ABNORMAL HIGH (ref 8–23)
CO2: 24 mmol/L (ref 22–32)
Calcium: 8.9 mg/dL (ref 8.9–10.3)
Chloride: 106 mmol/L (ref 98–111)
Creatinine: 1.48 mg/dL — ABNORMAL HIGH (ref 0.44–1.00)
GFR, Estimated: 36 mL/min — ABNORMAL LOW (ref >60)
Glucose, Bld: 109 mg/dL — ABNORMAL HIGH (ref 70–99)
Potassium: 4.9 mmol/L (ref 3.5–5.1)
Sodium: 137 mmol/L (ref 135–145)
Total Bilirubin: 0.4 mg/dL (ref 0.3–1.2)
Total Protein: 6.6 g/dL (ref 6.5–8.1)

## 2021-03-01 LAB — CBC WITH DIFFERENTIAL (CANCER CENTER ONLY)
Abs Immature Granulocytes: 0.02 10*3/uL (ref 0.00–0.07)
Basophils Absolute: 0.1 10*3/uL (ref 0.0–0.1)
Basophils Relative: 1 %
Eosinophils Absolute: 0.4 10*3/uL (ref 0.0–0.5)
Eosinophils Relative: 5 %
HCT: 42.6 % (ref 36.0–46.0)
Hemoglobin: 14.1 g/dL (ref 12.0–15.0)
Immature Granulocytes: 0 %
Lymphocytes Relative: 15 %
Lymphs Abs: 1.3 10*3/uL (ref 0.7–4.0)
MCH: 31.9 pg (ref 26.0–34.0)
MCHC: 33.1 g/dL (ref 30.0–36.0)
MCV: 96.4 fL (ref 80.0–100.0)
Monocytes Absolute: 1.1 10*3/uL — ABNORMAL HIGH (ref 0.1–1.0)
Monocytes Relative: 13 %
Neutro Abs: 5.8 10*3/uL (ref 1.7–7.7)
Neutrophils Relative %: 66 %
Platelet Count: 290 10*3/uL (ref 150–400)
RBC: 4.42 MIL/uL (ref 3.87–5.11)
RDW: 15.2 % (ref 11.5–15.5)
WBC Count: 8.8 10*3/uL (ref 4.0–10.5)
nRBC: 0.3 % — ABNORMAL HIGH (ref 0.0–0.2)

## 2021-03-03 ENCOUNTER — Other Ambulatory Visit: Payer: Self-pay

## 2021-03-03 ENCOUNTER — Other Ambulatory Visit (HOSPITAL_COMMUNITY): Payer: Self-pay

## 2021-03-03 ENCOUNTER — Inpatient Hospital Stay: Payer: Medicare PPO

## 2021-03-03 ENCOUNTER — Inpatient Hospital Stay: Payer: Medicare PPO | Admitting: Hematology

## 2021-03-03 ENCOUNTER — Encounter: Payer: Self-pay | Admitting: Hematology

## 2021-03-03 VITALS — BP 138/68 | HR 70 | Temp 98.0°F | Resp 18 | Ht 64.0 in | Wt 174.1 lb

## 2021-03-03 DIAGNOSIS — C186 Malignant neoplasm of descending colon: Secondary | ICD-10-CM | POA: Diagnosis not present

## 2021-03-03 DIAGNOSIS — Z5112 Encounter for antineoplastic immunotherapy: Secondary | ICD-10-CM | POA: Diagnosis not present

## 2021-03-03 DIAGNOSIS — R432 Parageusia: Secondary | ICD-10-CM

## 2021-03-03 MED ORDER — SODIUM CHLORIDE 0.9 % IV SOLN: Freq: Once | INTRAVENOUS | Status: AC

## 2021-03-03 MED ORDER — SODIUM CHLORIDE 0.9 % IV SOLN
200.0000 mg | Freq: Once | INTRAVENOUS | Status: AC
  Administered 2021-03-03: 200 mg via INTRAVENOUS
  Filled 2021-03-03: qty 200

## 2021-03-03 MED ORDER — CAPECITABINE 500 MG PO TABS
ORAL_TABLET | ORAL | 1 refills | Status: DC
  Filled 2021-03-03: qty 70, fill #0
  Filled 2021-03-08: qty 70, 21d supply, fill #0
  Filled 2021-03-30: qty 70, 21d supply, fill #1

## 2021-03-03 NOTE — Patient Instructions (Signed)
[facility]  Discharge Instructions: ?Thank you for choosing [facility] to provide your oncology and hematology care.  ? ?If you have a lab appointment with the Cancer Center, please go directly to the Cancer Center and check in at the registration area. ?  ?Wear comfortable clothing and clothing appropriate for easy access to any Portacath or PICC line.  ? ?We strive to give you quality time with your provider. You may need to reschedule your appointment if you arrive late (15 or more minutes).  Arriving late affects you and other patients whose appointments are after yours.  Also, if you miss three or more appointments without notifying the office, you may be dismissed from the clinic at the provider?s discretion.    ?  ?For prescription refill requests, have your pharmacy contact our office and allow 72 hours for refills to be completed.   ? ?Today you received the following chemotherapy and/or immunotherapy agents: Keytruda ?  ?To help prevent nausea and vomiting after your treatment, we encourage you to take your nausea medication as directed. ? ?BELOW ARE SYMPTOMS THAT SHOULD BE REPORTED IMMEDIATELY: ?*FEVER GREATER THAN 100.4 F (38 ?C) OR HIGHER ?*CHILLS OR SWEATING ?*NAUSEA AND VOMITING THAT IS NOT CONTROLLED WITH YOUR NAUSEA MEDICATION ?*UNUSUAL SHORTNESS OF BREATH ?*UNUSUAL BRUISING OR BLEEDING ?*URINARY PROBLEMS (pain or burning when urinating, or frequent urination) ?*BOWEL PROBLEMS (unusual diarrhea, constipation, pain near the anus) ?TENDERNESS IN MOUTH AND THROAT WITH OR WITHOUT PRESENCE OF ULCERS (sore throat, sores in mouth, or a toothache) ?UNUSUAL RASH, SWELLING OR PAIN  ?UNUSUAL VAGINAL DISCHARGE OR ITCHING  ? ?Items with * indicate a potential emergency and should be followed up as soon as possible or go to the Emergency Department if any problems should occur. ? ?Please show the CHEMOTHERAPY ALERT CARD or IMMUNOTHERAPY ALERT CARD at check-in to the  Emergency Department and triage nurse. ? ?Should you have questions after your visit or need to cancel or reschedule your appointment, please contact [facility]  Dept: [phone]  and follow the prompts.  Office hours are 8:00 a.m. to 4:30 p.m. Monday - Friday. Please note that voicemails left after 4:00 p.m. may not be returned until the following business day.  We are closed weekends and major holidays. You have access to a nurse at all times for urgent questions. Please call the main number to the clinic Dept: [phone] and follow the prompts. ? ? ?For any non-urgent questions, you may also contact your provider using MyChart. We now offer e-Visits for anyone 18 and older to request care online for non-urgent symptoms. For details visit [email or web address]. ?  ?Also download the MyChart app! Go to the app store, search "MyChart", open the app, select [facility], and log in with your MyChart username and password. ? ?Due to Covid, a mask is required upon entering the hospital/clinic. If you do not have a mask, one will be given to you upon arrival. For doctor visits, patients may have 1 support person aged 18 or older with them. For treatment visits, patients cannot have anyone with them due to current Covid guidelines and our immunocompromised population.  ? ?

## 2021-03-03 NOTE — Progress Notes (Signed)
[place]   [phone] [phone]   Clinic Follow up Note   Patient Care Team: Nche, [name], NP as PCP - General (Internal Medicine) Fanny Skates, MD as Consulting Physician (General Surgery) Thea Silversmith, MD as Consulting Physician (Radiation Oncology) Nicholas Lose, MD as Consulting Physician (Hematology and Oncology) Garvin Fila, MD as Consulting Physician (Neurology) Consuella Lose, MD as Consulting Physician (Neurosurgery) Katy Apo, MD as Consulting Physician (Ophthalmology) Jonnie Finner, RN (Inactive) as Oncology Nurse Navigator Truitt Merle, MD as Consulting Physician (Hematology and Oncology) Leighton Ruff, MD as Consulting Physician (General Surgery)  Date of Service:  03/03/2021  CHIEF COMPLAINT: f/u of colon cancer  CURRENT THERAPY:  Xeloda and Keytruda, started 09/21/20. Held 12/25/20-02/09/21  ASSESSMENT & PLAN:  Briana Ochoa is a 81 y.o. female with   1. Colon cancer in Splenic Flexure, pT3N0M0, Stage II, MSI-High -she was diagnosed on 04/23/20 by Colonoscopy  -resection with Dr Marcello Moores on 05/28/20 showed 9.7cm mass in splenic flexure with invasive adenocarcinoma, poorly differentiated, negative margins, negative LNs.  -GuardantReveal on 07/07/20 was positive, which likely indicates microscopic disease and high risk of recurrence. -PET scan was recommended but denied by her insurance. She proceeded with CT CAP w/o contrast on 08/31/20 showing no specific findings of active malignancy.  -She began Xeloda and Keytruda on 09/21/20. She has tolerated treatment well overall. However, Xeloda held due to increased Cr and then low EGFR from 10/28-02/09/21, and Beryle Flock was held due to taste change/mouth numbness from 12/25/20-02/09/21. -CT CAP 03/01/21 showed enlarging lesion in LUL, otherwise no disease in abdomen or pelvis. I reviewed the results with her today. While I do not feel this is impressive, but given her  history of cancers and history of smoking, so we will proceed with PET scan to rule out malignancy. -We will continue current treatment, lab reviewed, adequate proceed Xeloda and Keytruda today.  Plan to give her 2 more cycles of Xeloda and continue Keytruda for a total of one year.    2. Symptom Management: taste change/mouth numbness -she has experienced taste changes and mouth burning without ulceration. -symptoms persisted despite holding Xeloda and Keytruda. -No improvement with a course of steroid, indicating this is probably not related to [facility] -she reports she had a brain MRI with Dr. Kathyrn Sheriff, who told her it appeared normal. (The images are available in PACS, dated 01/19/21) -f/u with neurology as needed    3. H/o DVT 04/2020 -she was found to have an elevated D-dimer back on 04/08/20. She was found to have DVT in the RLE. This was still present on 04/28/20 and she was started on Eliquis shortly after. -I discussed that her chemo can increase her risk of blood clots. She notes the Eliquis is expensive. I advised her to switch to baby aspirin.   4. H/o of Left breast DCIS, H/o Bladder cancer, H/o Melanoma in 2017 of right lower leg -Diagnosed with DCIS in 2015, S/p left lumpectomy, Adjuvant Radiation and 5 years of Anastrozole. Treated by Dr Lindi Adie. -Diagnosed with bladder cancer in 02/2018, early stage, S/p surgery. Followed by Dr. Alyson Ingles -most recent mammogram from 02/11/21 was negative.   5. Genetic Testing -Given her personal history of cancer and family history of cancer, I recommend genetic testing.  -performed 07/07/20, results negative   6. Comorbidites: CKD, DM, Heart murmur, H/o mini stroke from Brain aneurysm, HTN, Osteopenia -Continue medications and to f/u with other physicians for management.      PLAN:  -  proceed with Keytruda today -continue Xeloda 2 tabs in morning and 3 tabs in evening for 14 days, she started today  -Lab, follow-up and Keytruda in 3 weeks with  PET scan a few days before   No problem-specific Assessment & Plan notes found for this encounter.   SUMMARY OF ONCOLOGIC HISTORY: Oncology History Overview Note  Cancer Staging Cancer of left colon [facility]) Staging form: Colon and Rectum, AJCC 8th Edition - Pathologic stage from 05/28/2020: Stage IIA (pT3, pN0, cM0) - Signed by Truitt Merle, MD on 06/19/2020 Stage prefix: Initial diagnosis Total positive nodes: 0 Histologic grading system: 4 grade system Histologic grade (G): G3 Residual tumor (R): R0 - None  Ductal carcinoma in situ (DCIS) of left breast Staging form: Breast, AJCC 7th Edition - Clinical: Stage 0 (Tis (DCIS), N0, cM0) - Unsigned Specimen type: Core Needle Biopsy Histopathologic type: 9932 Laterality: Left Staging comments: Staged at breast conference 7.22.15  - Pathologic: Stage 0 (Tis (DCIS)(2), N0, cM0) - Unsigned Specimen type: Core Needle Biopsy Histopathologic type: 9932 Laterality: Left Tumor size (mm): 3 Multiple tumors: Yes Number of tumors: 2 Method of lymph node assessment: Clinical Method of detection of distant metastases: Clinical Residual tumor (R): R0 - None Estrogen receptor status: Positive Progesterone receptor status: Positive    Ductal carcinoma in situ (DCIS) of left breast  08/20/2013 Initial Diagnosis   Left breast 12:00: DCIS with calcifications, yet 100%, PR 100%; 09/04/2013 second group of calcifications also biopsied proven to be DCIS ER/PR positive   09/19/2013 Surgery   Left breast lumpectomy DCIS 2 foci margins negative, 0.2 and 0.3 cm ER 100% PR 100%   10/08/2013 - 11/12/2013 Radiation Therapy   Adjuvant radiation therapy   11/12/2013 - 2020 Anti-estrogen oral therapy   Anastrozole 1 mg daily plan is for 5 years   01/09/2014 - 01/10/2014 Hospital Admission   Pipeline embolization RICA aneurysm in the brain   07/30/2020 Genetic Testing   Negative genetic testing:  No pathogenic variants detected on the [employer] CancerNext-Expanded +  RNAinsight panel. The report date is 07/30/2020.   The CancerNext-Expanded + RNAinsight gene panel offered by [employer] and includes sequencing and rearrangement analysis for the following 77 genes: AIP, ALK, APC, ATM, AXIN2, BAP1, BARD1, BLM, BMPR1A, BRCA1, BRCA2, BRIP1, CDC73, CDH1, CDK4, CDKN1B, CDKN2A, CHEK2, CTNNA1, DICER1, FANCC, FH, FLCN, GALNT12, KIF1B, LZTR1, MAX, MEN1, MET, MLH1, MSH2, MSH3, MSH6, MUTYH, NBN, NF1, NF2, NTHL1, PALB2, PHOX2B, PMS2, POT1, PRKAR1A, PTCH1, PTEN, RAD51C, RAD51D, RB1, RECQL, RET, SDHA, SDHAF2, SDHB, SDHC, SDHD, SMAD4, SMARCA4, SMARCB1, SMARCE1, STK11, SUFU, TMEM127, TP53, TSC1, TSC2, VHL and XRCC2 (sequencing and deletion/duplication); EGFR, EGLN1, HOXB13, KIT, MITF, PDGFRA, POLD1 and POLE (sequencing only); EPCAM and GREM1 (deletion/duplication only). RNA data is routinely analyzed for use in variant interpretation for all genes.   Bladder cancer ([place])  02/09/2018 Initial Diagnosis   Bladder cancer ([place])   07/30/2020 Genetic Testing   Negative genetic testing:  No pathogenic variants detected on the [employer] CancerNext-Expanded + RNAinsight panel. The report date is 07/30/2020.   The CancerNext-Expanded + RNAinsight gene panel offered by [employer] and includes sequencing and rearrangement analysis for the following 77 genes: AIP, ALK, APC, ATM, AXIN2, BAP1, BARD1, BLM, BMPR1A, BRCA1, BRCA2, BRIP1, CDC73, CDH1, CDK4, CDKN1B, CDKN2A, CHEK2, CTNNA1, DICER1, FANCC, FH, FLCN, GALNT12, KIF1B, LZTR1, MAX, MEN1, MET, MLH1, MSH2, MSH3, MSH6, MUTYH, NBN, NF1, NF2, NTHL1, PALB2, PHOX2B, PMS2, POT1, PRKAR1A, PTCH1, PTEN, RAD51C, RAD51D, RB1, RECQL, RET, SDHA, SDHAF2, SDHB, SDHC, SDHD, SMAD4, SMARCA4, SMARCB1,  SMARCE1, STK11, SUFU, TMEM127, TP53, TSC1, TSC2, VHL and XRCC2 (sequencing and deletion/duplication); EGFR, EGLN1, HOXB13, KIT, MITF, PDGFRA, POLD1 and POLE (sequencing only); EPCAM and GREM1 (deletion/duplication only). RNA data is routinely analyzed for use in variant  interpretation for all genes.   08/31/2020 Imaging   CT CAP w/o contrast  IMPRESSION: 1. No specific findings of active malignancy on today's noncontrast CT. There has been interval partial colectomy involving the tumor site with reanastomosis. Some faint stranding in the adjacent omentum is probably from scarring or fat necrosis, less likely due to early tumor given the lack of overt nodularity. Surveillance imaging is likely indicated, and PET-CT may have a role in imaging follow up. 2. Several small pulmonary nodules, in addition to a larger mixed density nodule in the left upper lobe. Surveillance is recommended. 3. Other imaging findings of potential clinical significance: Aortic Atherosclerosis (ICD10-I70.0). Coronary atherosclerosis. Mitral valve calcification. Small type 1 hiatal hernia. Splenectomy with small amount of regenerative splenic tissue as well as a venous varix arising from the splenic vein. Hyperdense left kidney lower pole exophytic homogeneous lesion, probably a complex cyst. Sigmoid colon diverticulosis. Multilevel lumbar spondylosis and degenerative disc disease.   Cancer of left colon ([place])  04/23/2020 Procedure   Upper Endoscopy and Colonoscopy by Dr Carlean Purl IMPRESSION Erythematous mucosa in the antrum. Biopsied. - Small hiatal hernia. - Gastroesophageal flap valve classified as Hill Grade IV (no fold, wide open lumen, hiatal hernia present). - The examination was otherwise normal.   IMPRESSION - Decreased sphincter tone found on digital rectal exam. - Malignant partially obstructing tumor in the proximal descending colon. Biopsied. Tattooed. - Three 5 to 20 mm polyps in the sigmoid colon, removed piecemeal using a cold snare. Resected and retrieved. - Severe diverticulosis in the sigmoid colon. - I left several diminutive descending (proximal to tattoo), sigmoid and rectal polyps - The examination was otherwise normal on direct and retroflexion  views.     04/23/2020 Initial Diagnosis   Diagnosis 1. Descending Colon Polyp, mass - ADENOCARCINOMA. 2. Sigmoid Colon Polyp, (3) - MULTIPLE FRAGMENTS OF TUBULAR ADENOMA. - MULTIPLE FRAGMENTS OF SESSILE SERRATED POLYP WITHOUT DYSPLASIA. - NO HIGH GRADE DYSPLASIA OR MALIGNANCY. Microscopic Comment 1. Dr. Vic Ripper has reviewed the case. Dr. Carlean Purl was notified on 04/24/2020. 2. Despite being left sided the fragments have features of a sessile serrated polyp, as opposed to, hyperplastic polyp.   04/27/2020 Imaging   CT AP IMPRESSION: Bulky annular soft tissue mass involving the distal transverse colon with extension into adjacent pericolonic fat, consistent with primary colon carcinoma.   No evidence of metastatic disease.   Colonic diverticulosis. No radiographic evidence of diverticulitis.   Aortic Atherosclerosis (ICD10-I70.0).     05/05/2020 Imaging   CT Chest IMPRESSION: Bulky annular soft tissue mass involving the distal transverse colon with extension into adjacent pericolonic fat, consistent with primary colon carcinoma.   No evidence of metastatic disease.   Colonic diverticulosis. No radiographic evidence of diverticulitis.   Aortic Atherosclerosis (ICD10-I70.0).     05/28/2020 Initial Diagnosis   Cancer of left colon ([place])   05/28/2020 Cancer Staging   Staging form: Colon and Rectum, AJCC 8th Edition - Pathologic stage from 05/28/2020: Stage IIA (pT3, pN0, cM0) - Signed by Truitt Merle, MD on 06/19/2020 Stage prefix: Initial diagnosis Total positive nodes: 0 Histologic grading system: 4 grade system Histologic grade (G): G3 Residual tumor (R): R0 - None    05/28/2020 Surgery   XI ROBOT ASSISTED RESECTION OF SPLENIC FLEXURE by Dr  Thomas    FINAL MICROSCOPIC DIAGNOSIS:   A. COLON, SPLENIC FLEXURE, RESECTION:  - Invasive adenocarcinoma, poorly differentiated, spanning 9.7 cm.  - Tumor invades through muscularis propria into pericolonic tissue.  - Resection  margins are negative.  - Tubular adenoma (X1).  - Sessile serrate polyp without dysplasia (x2).  - Diverticulosis.  - Polypectomy scar.  - Twenty-four of twenty-four lymph nodes negative for carcinoma (0/24).  - See oncology table.    07/07/2020 Miscellaneous   Guardant Reveal ctDNA positive MSI high   07/30/2020 Genetic Testing   Negative genetic testing:  No pathogenic variants detected on the [employer] CancerNext-Expanded + RNAinsight panel. The report date is 07/30/2020.   The CancerNext-Expanded + RNAinsight gene panel offered by [employer] and includes sequencing and rearrangement analysis for the following 77 genes: AIP, ALK, APC, ATM, AXIN2, BAP1, BARD1, BLM, BMPR1A, BRCA1, BRCA2, BRIP1, CDC73, CDH1, CDK4, CDKN1B, CDKN2A, CHEK2, CTNNA1, DICER1, FANCC, FH, FLCN, GALNT12, KIF1B, LZTR1, MAX, MEN1, MET, MLH1, MSH2, MSH3, MSH6, MUTYH, NBN, NF1, NF2, NTHL1, PALB2, PHOX2B, PMS2, POT1, PRKAR1A, PTCH1, PTEN, RAD51C, RAD51D, RB1, RECQL, RET, SDHA, SDHAF2, SDHB, SDHC, SDHD, SMAD4, SMARCA4, SMARCB1, SMARCE1, STK11, SUFU, TMEM127, TP53, TSC1, TSC2, VHL and XRCC2 (sequencing and deletion/duplication); EGFR, EGLN1, HOXB13, KIT, MITF, PDGFRA, POLD1 and POLE (sequencing only); EPCAM and GREM1 (deletion/duplication only). RNA data is routinely analyzed for use in variant interpretation for all genes.   08/31/2020 Imaging   CT CAP w/o contrast  IMPRESSION: 1. No specific findings of active malignancy on today's noncontrast CT. There has been interval partial colectomy involving the tumor site with reanastomosis. Some faint stranding in the adjacent omentum is probably from scarring or fat necrosis, less likely due to early tumor given the lack of overt nodularity. Surveillance imaging is likely indicated, and PET-CT may have a role in imaging follow up. 2. Several small pulmonary nodules, in addition to a larger mixed density nodule in the left upper lobe. Surveillance is recommended. 3. Other imaging  findings of potential clinical significance: Aortic Atherosclerosis (ICD10-I70.0). Coronary atherosclerosis. Mitral valve calcification. Small type 1 hiatal hernia. Splenectomy with small amount of regenerative splenic tissue as well as a venous varix arising from the splenic vein. Hyperdense left kidney lower pole exophytic homogeneous lesion, probably a complex cyst. Sigmoid colon diverticulosis. Multilevel lumbar spondylosis and degenerative disc disease.   10/02/2020 -  Chemotherapy   Patient is on Treatment Plan : COLORECTAL Pembrolizumab q21d     03/01/2021 Imaging   EXAM: CT CHEST, ABDOMEN AND PELVIS WITHOUT CONTRAST  IMPRESSION: 1. Mixed attenuation lesion posterior left upper lobe has areas of more confluent and enlarging nodularity today. Interval progression raises concern for neoplasm. Consider PET-CT to further evaluate. 2. Stable 7 mm right lower lobe pulmonary nodule with a slightly more conspicuous 3 mm nodule in the right lower lobe. Continued close attention on follow-up recommended. 3. No evidence for metastatic disease in the abdomen or pelvis. 4. Aortic Atherosclerosis (ICD10-I70.0).      INTERVAL HISTORY:  Briana Ochoa is here for a follow up of colon cancer. She was last seen by me on 02/10/21. She presents to the clinic alone. She reports she continues to have mouth issues-- she describes it as having wool in her mouth. She reports she can taste liquids more than solids.   All other systems were reviewed with the patient and are negative.  MEDICAL HISTORY:  Past Medical History:  Diagnosis Date   Allergy    seasonal allergies   Anemia  on meds   Aortic atherosclerosis (HCC)    Bladder cancer ([place]) 2020   Bladder tumor    Bloody diarrhea 09/29/2017   Breast cancer ([place]) 2015   Left Breast Cancer   Cataract    bilateral -sx   CKD (chronic kidney disease), stage III ([place])    DM related   Colon cancer ([place])    Diabetes mellitus type 2,  diet-controlled ([place])    diet controlled   Dyspnea 09/28/2017   Dysuria    Family history of adverse reaction to anesthesia    aunt- N/V    Family history of breast cancer    Family history of kidney cancer    Family history of throat cancer    Family history of uterine cancer    Fatigue 09/28/2017   Frequency of urination    Grade I diastolic dysfunction 76/28/3151   Noted on ECHO   Gross hematuria 09/29/2017   Heart murmur    since rheumatic fever as child   Hematuria    History of cardiomegaly 02/12/2005   Mild, noted on CXR   History of CVA (cerebrovascular accident) 08/05/2014   post op cerebral angiogram, cerebral thrombosis w/ cerebral infarction;  11-02-2017  per pt no residuals   History of malignant melanoma of skin 12/2015   s/p wide local excision right lower leg (per pt localized)   History of rheumatic fever as a child    Hyperlipidemia    on meds   Hypertension    on meds   Intracranial aneurysm dx 07/ 2015   paraophthalmic RICA aneurysm /   s/p  Pipeline embolization right ICA 01-09-2014   Jaundice 09/28/2017   Malignant neoplasm of central portion of left breast in female, estrogen receptor positive [facility]) oncologist-  dr Lindi Adie   dx 07/ 2015--- DCIS, ER/PR positive-- s/p  left breast lumpectomy 09-19-2013,  completed radiation 11-12-2013,  started arimidex 11-12-2013   Melanoma ([place])    righ tleg    OA (osteoarthritis)    knees, hands, R shoulder   Osteopenia 10/16/2012   Diffuse   Personal history of radiation therapy    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   Sigmoid diverticulosis    Weakness 09/28/2017   Wears dentures    upper   Wears glasses    Wears hearing aid in both ears     SURGICAL HISTORY: Past Surgical History:  Procedure Laterality Date   BREAST LUMPECTOMY Left 2015   BREAST LUMPECTOMY WITH NEEDLE LOCALIZATION Left 09/19/2013   Procedure: LEFT BREAST LUMPECTOMY WITH DOUBLE WIRE BRACKETED  NEEDLE LOCALIZATION;  Surgeon:  [name], MD;  Location: [place];  Service: General;  Laterality: Left;   CATARACT EXTRACTION W/ INTRAOCULAR LENS  IMPLANT, BILATERAL  06/2015   CESAREAN SECTION  1964   CHOLECYSTECTOMY OPEN  1970s   CYSTOSCOPY W/ URETERAL STENT PLACEMENT Left 11/07/2017   Procedure: CYSTOSCOPY WITH RETROGRADE PYELOGRAM/URETERAL STENT PLACEMENT;  Surgeon: Cleon Gustin, MD;  Location: [facility];  Service: Urology;  Laterality: Left;   CYSTOSCOPY W/ URETERAL STENT PLACEMENT Bilateral 04/27/2018   Procedure: CYSTOSCOPY WITH RETROGRADE PYELOGRAM;  Surgeon: Cleon Gustin, MD;  Location: [facility];  Service: Urology;  Laterality: Bilateral;   FINGER SURGERY     lt thumb  from dog bite   IR  NEPHROURETERAL CATH PLACE LEFT  04/28/2018   MELANOMA EXCISION Right 12/28/2015   Procedure: EXCISION MELANOMA RIGHT LOWER LEG;  Surgeon: Fanny Skates,  MD;  Location: [place];  Service: General;  Laterality: Right;   RADIOLOGY WITH ANESTHESIA N/A 01/09/2014   Procedure: Embolization;  Surgeon: Consuella Lose, MD;  Location: [place];  Service: Radiology;  Laterality: N/A;   SPLENECTOMY, TOTAL  2009 approx.   splen injury due to fall off horse   TONSILLECTOMY  child   TRANSURETHRAL RESECTION OF BLADDER TUMOR N/A 11/07/2017   Procedure: TRANSURETHRAL RESECTION OF BLADDER TUMOR (TURBT), POSSIBLE STENT PLACEMENT;  Surgeon: Cleon Gustin, MD;  Location: [facility];  Service: Urology;  Laterality: N/A;   TRANSURETHRAL RESECTION OF BLADDER TUMOR N/A 12/14/2017   Procedure: TRANSURETHRAL RESECTION OF BLADDER TUMOR (TURBT);  Surgeon: Cleon Gustin, MD;  Location[facility];  Service: Urology;  Laterality: N/A;  [place] TUMOR N/A 04/27/2018   Procedure: TRANSURETHRAL RESECTION OF BLADDER TUMOR (TURBT);  Surgeon: Cleon Gustin, MD;  Location: [facility];  Service: Urology;   Laterality: N/A;  6 MINS    I have reviewed the social history and family history with the patient and they are unchanged from previous note.  ALLERGIES:  is allergic to feraheme [ferumoxytol].  MEDICATIONS:  Current Outpatient Medications  Medication Sig Dispense Refill   acetaminophen (TYLENOL) 500 MG tablet Take 1,000 mg by mouth daily.     capecitabine (XELODA) 500 MG tablet Take 2 tablets (1000 mg total) by mouth in morning and 3 tablets (1500 mg total) by mouth in evening. Take within 30 minutes after meals. Take for 14 days on and 7 days off. Repeat every 21 days. 70 tablet 1   glucose blood (TRUE METRIX BLOOD GLUCOSE TEST) test strip Use as instructed to test blood sugar once daily E08.638 100 each 2   lisinopril (ZESTRIL) 20 MG tablet TAKE 1 TABLET EVERY DAY 90 tablet 3   ondansetron (ZOFRAN) 8 MG tablet Take 1 tablet (8 mg total) by mouth every 8 (eight) hours as needed for nausea or vomiting. 20 tablet 0   pravastatin (PRAVACHOL) 10 MG tablet TAKE 1 TABLET EVERY DAY (STOP PRAVASTATIN 20MG) 90 tablet 3   predniSONE (STERAPRED UNI-PAK 21 TAB) 10 MG (21) TBPK tablet Take 4 tabs daily for 2 days then drip one tab every 2 days until finish. 21 tablet 0   No current facility-administered medications for this visit.    PHYSICAL EXAMINATION: ECOG PERFORMANCE STATUS: 0 - Asymptomatic  Vitals:   03/03/21 1224  BP: 138/68  Pulse: 70  Resp: 18  Temp: 98 F (36.7 C)  SpO2: 97%   Wt Readings from Last 3 Encounters:  03/03/21 174 lb 1.6 oz (79 kg)  02/10/21 172 lb 11.2 oz (78.3 kg)  12/25/20 170 lb 9.6 oz (77.4 kg)     GENERAL:alert, no distress and comfortable SKIN: skin color normal, no rashes or significant lesions EYES: normal, Conjunctiva are pink and non-injected, sclera clear  NEURO: alert & oriented x 3 with fluent speech  LABORATORY DATA:  I have reviewed the data as listed CBC Latest Ref Rng & Units 03/01/2021 02/10/2021 12/25/2020  WBC 4.0 - 10.5 K/uL 8.8 8.6 10.4   Hemoglobin 12.0 - 15.0 g/dL 14.1 14.6 15.1(H)  Hematocrit 36.0 - 46.0 % 42.6 44.3 46.5(H)  Platelets 150 - 400 K/uL 290 310 267     CMP Latest Ref Rng & Units 03/01/2021 02/10/2021 12/25/2020  Glucose 70 - 99 mg/dL 109(H) 106(H) 90  BUN 8 - 23 mg/dL 25(H) 29(H) 23  Creatinine 0.44 - 1.00 mg/dL 1.48(H) 1.66(H) 1.47(H)  Sodium 135 - 145 mmol/L 137 140 140  Potassium 3.5 - 5.1 mmol/L 4.9 4.5 4.7  Chloride 98 - 111 mmol/L 106 108 107  CO2 22 - 32 mmol/L 24 24 24   Calcium 8.9 - 10.3 mg/dL 8.9 9.2 9.3  Total Protein 6.5 - 8.1 g/dL 6.6 6.8 7.4  Total Bilirubin 0.3 - 1.2 mg/dL 0.4 0.3 <0.2(L)  Alkaline Phos 38 - 126 U/L 65 80 102  AST 15 - 41 U/L 19 12(L) 15  ALT 0 - 44 U/L 12 8 10       RADIOGRAPHIC STUDIES: I have personally reviewed the radiological images as listed and agreed with the findings in the report. No results found.    Orders Placed This Encounter  Procedures   NM PET Image Initial (PI) Skull Base To Thigh    Standing Status:   Future    Standing Expiration Date:   03/03/2022    Order Specific Question:   If indicated for the ordered procedure, I authorize the administration of a radiopharmaceutical per Radiology protocol    Answer:   Yes    Order Specific Question:   Preferred imaging location?    Answer:   [place]   Methylmalonic acid, serum    Standing Status:   Future    Standing Expiration Date:   03/03/2022   Vitamin B12    Standing Status:   Future    Standing Expiration Date:   03/03/2022   Folate, Serum    Standing Status:   Future    Standing Expiration Date:   03/03/2022   Vitamin B6    Standing Status:   Future    Standing Expiration Date:   03/03/2022   All questions were answered. The patient knows to call the clinic with any problems, questions or concerns. No barriers to learning was detected. The total time spent in the appointment was 30 minutes.     Truitt Merle, MD 03/03/2021   I, Wilburn Mylar, am acting as scribe for Truitt Merle, MD.    I have reviewed the above documentation for accuracy and completeness, and I agree with the above.

## 2021-03-04 ENCOUNTER — Telehealth: Payer: Self-pay | Admitting: Hematology

## 2021-03-04 NOTE — Telephone Encounter (Signed)
Scheduled per 1/25 los, pt has been called and confirmed

## 2021-03-08 ENCOUNTER — Other Ambulatory Visit (HOSPITAL_COMMUNITY): Payer: Self-pay

## 2021-03-17 ENCOUNTER — Ambulatory Visit (HOSPITAL_COMMUNITY)
Admission: RE | Admit: 2021-03-17 | Discharge: 2021-03-17 | Disposition: A | Discharge status: Home / Self Care | Payer: Medicare PPO | Source: Ambulatory Visit | Attending: Hematology | Admitting: Hematology

## 2021-03-17 ENCOUNTER — Other Ambulatory Visit: Payer: Self-pay

## 2021-03-17 DIAGNOSIS — I2510 Atherosclerotic heart disease of native coronary artery without angina pectoris: Secondary | ICD-10-CM | POA: Diagnosis not present

## 2021-03-17 DIAGNOSIS — C186 Malignant neoplasm of descending colon: Secondary | ICD-10-CM | POA: Diagnosis not present

## 2021-03-17 DIAGNOSIS — K5790 Diverticulosis of intestine, part unspecified, without perforation or abscess without bleeding: Secondary | ICD-10-CM | POA: Diagnosis not present

## 2021-03-17 DIAGNOSIS — Z85038 Personal history of other malignant neoplasm of large intestine: Secondary | ICD-10-CM | POA: Diagnosis not present

## 2021-03-17 DIAGNOSIS — K449 Diaphragmatic hernia without obstruction or gangrene: Secondary | ICD-10-CM | POA: Diagnosis not present

## 2021-03-17 LAB — GLUCOSE, CAPILLARY: Glucose-Capillary: 82 mg/dL (ref 70–99)

## 2021-03-17 MED ORDER — FLUDEOXYGLUCOSE F - 18 (FDG) INJECTION
8.7000 | Freq: Once | INTRAVENOUS | Status: AC | PRN
  Administered 2021-03-17: 8.52 via INTRAVENOUS

## 2021-03-18 ENCOUNTER — Other Ambulatory Visit (HOSPITAL_COMMUNITY): Payer: Self-pay

## 2021-03-22 ENCOUNTER — Other Ambulatory Visit: Payer: Medicare PPO

## 2021-03-26 ENCOUNTER — Inpatient Hospital Stay: Payer: Medicare PPO | Attending: Hematology | Admitting: Hematology

## 2021-03-26 ENCOUNTER — Other Ambulatory Visit: Payer: Self-pay

## 2021-03-26 ENCOUNTER — Inpatient Hospital Stay: Payer: Medicare PPO

## 2021-03-26 ENCOUNTER — Other Ambulatory Visit: Payer: Medicare PPO

## 2021-03-26 VITALS — BP 137/66 | HR 65 | Temp 98.4°F | Resp 17 | Wt 173.4 lb

## 2021-03-26 DIAGNOSIS — C185 Malignant neoplasm of splenic flexure: Secondary | ICD-10-CM | POA: Diagnosis not present

## 2021-03-26 DIAGNOSIS — C186 Malignant neoplasm of descending colon: Secondary | ICD-10-CM

## 2021-03-26 DIAGNOSIS — Z5112 Encounter for antineoplastic immunotherapy: Secondary | ICD-10-CM | POA: Insufficient documentation

## 2021-03-26 DIAGNOSIS — R432 Parageusia: Secondary | ICD-10-CM

## 2021-03-26 LAB — CMP (CANCER CENTER ONLY)
ALT: 9 U/L (ref 0–44)
AST: 17 U/L (ref 15–41)
Albumin: 3.9 g/dL (ref 3.5–5.0)
Alkaline Phosphatase: 80 U/L (ref 38–126)
Anion gap: 6 (ref 5–15)
BUN: 28 mg/dL — ABNORMAL HIGH (ref 8–23)
CO2: 26 mmol/L (ref 22–32)
Calcium: 9.2 mg/dL (ref 8.9–10.3)
Chloride: 107 mmol/L (ref 98–111)
Creatinine: 1.5 mg/dL — ABNORMAL HIGH (ref 0.44–1.00)
GFR, Estimated: 35 mL/min — ABNORMAL LOW (ref >60)
Glucose, Bld: 118 mg/dL — ABNORMAL HIGH (ref 70–99)
Potassium: 4.9 mmol/L (ref 3.5–5.1)
Sodium: 139 mmol/L (ref 135–145)
Total Bilirubin: 0.5 mg/dL (ref 0.3–1.2)
Total Protein: 6.4 g/dL — ABNORMAL LOW (ref 6.5–8.1)

## 2021-03-26 LAB — CBC WITH DIFFERENTIAL (CANCER CENTER ONLY)
Abs Immature Granulocytes: 0.02 10*3/uL (ref 0.00–0.07)
Basophils Absolute: 0.1 10*3/uL (ref 0.0–0.1)
Basophils Relative: 1 %
Eosinophils Absolute: 0.4 10*3/uL (ref 0.0–0.5)
Eosinophils Relative: 4 %
HCT: 41 % (ref 36.0–46.0)
Hemoglobin: 14.3 g/dL (ref 12.0–15.0)
Immature Granulocytes: 0 %
Lymphocytes Relative: 12 %
Lymphs Abs: 1.1 10*3/uL (ref 0.7–4.0)
MCH: 33.9 pg (ref 26.0–34.0)
MCHC: 34.9 g/dL (ref 30.0–36.0)
MCV: 97.2 fL (ref 80.0–100.0)
Monocytes Absolute: 1.2 10*3/uL — ABNORMAL HIGH (ref 0.1–1.0)
Monocytes Relative: 14 %
Neutro Abs: 6.1 10*3/uL (ref 1.7–7.7)
Neutrophils Relative %: 69 %
Platelet Count: 264 10*3/uL (ref 150–400)
RBC: 4.22 MIL/uL (ref 3.87–5.11)
RDW: 18.6 % — ABNORMAL HIGH (ref 11.5–15.5)
WBC Count: 8.9 10*3/uL (ref 4.0–10.5)
nRBC: 0 % (ref 0.0–0.2)

## 2021-03-26 LAB — FOLATE: Folate: 8.7 ng/mL (ref >5.9)

## 2021-03-26 LAB — VITAMIN B12: Vitamin B-12: 175 pg/mL — ABNORMAL LOW (ref 180–914)

## 2021-03-26 MED ORDER — SODIUM CHLORIDE 0.9 % IV SOLN
200.0000 mg | Freq: Once | INTRAVENOUS | Status: AC
  Administered 2021-03-26: 200 mg via INTRAVENOUS
  Filled 2021-03-26: qty 200

## 2021-03-26 MED ORDER — SODIUM CHLORIDE 0.9 % IV SOLN: Freq: Once | INTRAVENOUS | Status: AC

## 2021-03-26 NOTE — Patient Instructions (Signed)
[facility]  Discharge Instructions: ?Thank you for choosing [facility] to provide your oncology and hematology care.  ? ?If you have a lab appointment with the Cancer Center, please go directly to the Cancer Center and check in at the registration area. ?  ?Wear comfortable clothing and clothing appropriate for easy access to any Portacath or PICC line.  ? ?We strive to give you quality time with your provider. You may need to reschedule your appointment if you arrive late (15 or more minutes).  Arriving late affects you and other patients whose appointments are after yours.  Also, if you miss three or more appointments without notifying the office, you may be dismissed from the clinic at the provider?s discretion.    ?  ?For prescription refill requests, have your pharmacy contact our office and allow 72 hours for refills to be completed.   ? ?Today you received the following chemotherapy and/or immunotherapy agents: Keytruda ?  ?To help prevent nausea and vomiting after your treatment, we encourage you to take your nausea medication as directed. ? ?BELOW ARE SYMPTOMS THAT SHOULD BE REPORTED IMMEDIATELY: ?*FEVER GREATER THAN 100.4 F (38 ?C) OR HIGHER ?*CHILLS OR SWEATING ?*NAUSEA AND VOMITING THAT IS NOT CONTROLLED WITH YOUR NAUSEA MEDICATION ?*UNUSUAL SHORTNESS OF BREATH ?*UNUSUAL BRUISING OR BLEEDING ?*URINARY PROBLEMS (pain or burning when urinating, or frequent urination) ?*BOWEL PROBLEMS (unusual diarrhea, constipation, pain near the anus) ?TENDERNESS IN MOUTH AND THROAT WITH OR WITHOUT PRESENCE OF ULCERS (sore throat, sores in mouth, or a toothache) ?UNUSUAL RASH, SWELLING OR PAIN  ?UNUSUAL VAGINAL DISCHARGE OR ITCHING  ? ?Items with * indicate a potential emergency and should be followed up as soon as possible or go to the Emergency Department if any problems should occur. ? ?Please show the CHEMOTHERAPY ALERT CARD or IMMUNOTHERAPY ALERT CARD at check-in to the  Emergency Department and triage nurse. ? ?Should you have questions after your visit or need to cancel or reschedule your appointment, please contact [facility]  Dept: [phone]  and follow the prompts.  Office hours are 8:00 a.m. to 4:30 p.m. Monday - Friday. Please note that voicemails left after 4:00 p.m. may not be returned until the following business day.  We are closed weekends and major holidays. You have access to a nurse at all times for urgent questions. Please call the main number to the clinic Dept: [phone] and follow the prompts. ? ? ?For any non-urgent questions, you may also contact your provider using MyChart. We now offer e-Visits for anyone 18 and older to request care online for non-urgent symptoms. For details visit [email or web address]. ?  ?Also download the MyChart app! Go to the app store, search "MyChart", open the app, select [facility], and log in with your MyChart username and password. ? ?Due to Covid, a mask is required upon entering the hospital/clinic. If you do not have a mask, one will be given to you upon arrival. For doctor visits, patients may have 1 support person aged 18 or older with them. For treatment visits, patients cannot have anyone with them due to current Covid guidelines and our immunocompromised population.  ? ?

## 2021-03-26 NOTE — Progress Notes (Signed)
[place]   [phone] Fax:([phone]   Clinic Follow up Note   Patient Care Team: Nche, [name], NP as PCP - General (Internal Medicine) Fanny Skates, MD as Consulting Physician (General Surgery) Thea Silversmith, MD as Consulting Physician (Radiation Oncology) Nicholas Lose, MD as Consulting Physician (Hematology and Oncology) Garvin Fila, MD as Consulting Physician (Neurology) Consuella Lose, MD as Consulting Physician (Neurosurgery) Katy Apo, MD as Consulting Physician (Ophthalmology) Jonnie Finner, RN (Inactive) as Oncology Nurse Navigator Truitt Merle, MD as Consulting Physician (Hematology and Oncology) Leighton Ruff, MD as Consulting Physician (General Surgery)  Date of Service:  03/26/2021  CHIEF COMPLAINT: f/u of colon cancer  CURRENT THERAPY:  Xeloda and Keytruda, started 09/21/20. Held 12/25/20-02/09/21  ASSESSMENT & PLAN:  Briana Ochoa is a 81 y.o. female with   1. Colon cancer in Splenic Flexure, pT3N0M0, Stage II, MSI-High -she was diagnosed on 04/23/20 by Colonoscopy  -resection with Dr Marcello Moores on 05/28/20 showed 9.7cm mass in splenic flexure with invasive adenocarcinoma, poorly differentiated, negative margins, negative LNs.  -GuardantReveal on 07/07/20 was positive, which likely indicates microscopic disease and high risk of recurrence. -PET scan was recommended but denied by her insurance. She proceeded with CT CAP w/o contrast on 08/31/20 showing no specific findings of active malignancy.  -She began Xeloda and Keytruda on 09/21/20. She has tolerated treatment well overall. However, Xeloda held due to increased Cr and then low EGFR from 10/28-02/09/21, and Beryle Flock was held due to taste change/mouth numbness from 12/25/20-02/09/21. -PET scan 03/17/21 showed: mild FDG uptake to the LUL nodule, which has increased in size since 04/2020; no evidence of metastatic disease; hypermetabolic subcentimeter nodule of right  parotid gland. I reviewed the images and discussed the results with her today. I will refer her to pulmonary for lung biopsy, and review her scan in ENT conference to see if biopsy of parotid nodule if needed. I will refer her to Dr. Valeta Harms for the pulmonary nodule. -We will continue current treatment, lab reviewed, adequate proceed Xeloda and Keytruda today.  Plan to give her 1 more cycle of Xeloda (to complete 6 month therapy) and continue Keytruda for a total of one year.    2. Symptom Management: taste change/mouth numbness -she has experienced taste changes and mouth burning without ulceration. -symptoms persisted despite holding Xeloda and Keytruda. -No improvement with a course of steroid, indicating this is probably not related to [facility] -she reports she had a brain MRI with Dr. Kathyrn Sheriff, who told her it appeared normal. (The images are available in PACS, dated 01/19/21) -f/u with neurology as needed    3. H/o DVT 04/2020 -she was found to have an elevated D-dimer back on 04/08/20. She was found to have DVT in the RLE. This was still present on 04/28/20 and she was started on Eliquis shortly after. -I discussed that her chemo can increase her risk of blood clots. She notes the Eliquis is expensive. I advised her to switch to baby aspirin.   4. H/o of Left breast DCIS, H/o Bladder cancer, H/o Melanoma in 2017 of right lower leg -Diagnosed with DCIS in 2015, S/p left lumpectomy, Adjuvant Radiation and 5 years of Anastrozole. Treated by Dr Lindi Adie. -Diagnosed with bladder cancer in 02/2018, early stage, S/p surgery. Followed by Dr. Alyson Ingles, next cystoscopy scheduled for 05/03/21. -most recent mammogram from 02/11/21 was negative.   5. Genetic Testing -Given her personal history of cancer and family history of cancer, I recommend genetic testing.  -performed  07/07/20, results negative   6. Comorbidites: CKD, DM, Heart murmur, H/o mini stroke from Brain aneurysm, HTN, Osteopenia -Continue  medications and to f/u with other physicians for management.      PLAN:  -proceed with Keytruda today -continue Xeloda 2 tabs in morning and 3 tabs in evening for 14 days -Lab, follow-up and Keytruda in 3 and 6 weeks, plan to stop Xeloda after next cycle    No problem-specific Assessment & Plan notes found for this encounter.   SUMMARY OF ONCOLOGIC HISTORY: Oncology History Overview Note  Cancer Staging Cancer of left colon [facility]) Staging form: Colon and Rectum, AJCC 8th Edition - Pathologic stage from 05/28/2020: Stage IIA (pT3, pN0, cM0) - Signed by Truitt Merle, MD on 06/19/2020 Stage prefix: Initial diagnosis Total positive nodes: 0 Histologic grading system: 4 grade system Histologic grade (G): G3 Residual tumor (R): R0 - None  Ductal carcinoma in situ (DCIS) of left breast Staging form: Breast, AJCC 7th Edition - Clinical: Stage 0 (Tis (DCIS), N0, cM0) - Unsigned Specimen type: Core Needle Biopsy Histopathologic type: 9932 Laterality: Left Staging comments: Staged at breast conference 7.22.15  - Pathologic: Stage 0 (Tis (DCIS)(2), N0, cM0) - Unsigned Specimen type: Core Needle Biopsy Histopathologic type: 9932 Laterality: Left Tumor size (mm): 3 Multiple tumors: Yes Number of tumors: 2 Method of lymph node assessment: Clinical Method of detection of distant metastases: Clinical Residual tumor (R): R0 - None Estrogen receptor status: Positive Progesterone receptor status: Positive    Ductal carcinoma in situ (DCIS) of left breast  08/20/2013 Initial Diagnosis   Left breast 12:00: DCIS with calcifications, yet 100%, PR 100%; 09/04/2013 second group of calcifications also biopsied proven to be DCIS ER/PR positive   09/19/2013 Surgery   Left breast lumpectomy DCIS 2 foci margins negative, 0.2 and 0.3 cm ER 100% PR 100%   10/08/2013 - 11/12/2013 Radiation Therapy   Adjuvant radiation therapy   11/12/2013 - 2020 Anti-estrogen oral therapy   Anastrozole 1 mg daily plan is  for 5 years   01/09/2014 - 01/10/2014 Hospital Admission   Pipeline embolization 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 The report date is 07/30/2020.   The CancerNext-Expanded + RNAinsight gene panel offered by [employer] and includes sequencing and rearrangement analysis for the following 77 genes: AIP, ALK, APC, ATM, AXIN2, BAP1, BARD1, BLM, BMPR1A, BRCA1, BRCA2, BRIP1, CDC73, CDH1, CDK4, CDKN1B, CDKN2A, CHEK2, CTNNA1, DICER1, FANCC, FH, FLCN, GALNT12, KIF1B, LZTR1, MAX, MEN1, MET, MLH1, MSH2, MSH3, MSH6, MUTYH, NBN, NF1, NF2, NTHL1, PALB2, PHOX2B, PMS2, POT1, PRKAR1A, PTCH1, PTEN, RAD51C, RAD51D, RB1, RECQL, RET, SDHA, SDHAF2, SDHB, SDHC, SDHD, SMAD4, SMARCA4, SMARCB1, SMARCE1, STK11, SUFU, TMEM127, TP53, TSC1, TSC2, VHL and XRCC2 (sequencing and deletion/duplication); EGFR, EGLN1, HOXB13, KIT, MITF, PDGFRA, POLD1 and POLE (sequencing only); EPCAM and GREM1 (deletion/duplication only). RNA data is routinely analyzed for use in variant interpretation for all genes.   Bladder cancer ([place])  02/09/2018 Initial Diagnosis   Bladder cancer ([place])   07/30/2020 Genetic Testing   Negative genetic testing:  No pathogenic variants detected on the [employer] CancerNext-Expanded + RNAinsight panel. The report date is 07/30/2020.   The CancerNext-Expanded + RNAinsight gene panel offered by [employer] and includes sequencing and rearrangement analysis for the following 77 genes: AIP, ALK, APC, ATM, AXIN2, BAP1, BARD1, BLM, BMPR1A, BRCA1, BRCA2, BRIP1, CDC73, CDH1, CDK4, CDKN1B, CDKN2A, CHEK2, CTNNA1, DICER1, FANCC, FH, FLCN,  GALNT12, KIF1B, LZTR1, MAX, MEN1, MET, MLH1, MSH2, MSH3, MSH6, MUTYH, NBN, NF1, NF2, NTHL1, PALB2, PHOX2B, PMS2, POT1, PRKAR1A, PTCH1, PTEN, RAD51C, RAD51D, RB1, RECQL, RET, SDHA, SDHAF2, SDHB, SDHC, SDHD, SMAD4, SMARCA4, SMARCB1, SMARCE1, STK11, SUFU, TMEM127, TP53,  TSC1, TSC2, VHL and XRCC2 (sequencing and deletion/duplication); EGFR, EGLN1, HOXB13, KIT, MITF, PDGFRA, POLD1 and POLE (sequencing only); EPCAM and GREM1 (deletion/duplication only). RNA data is routinely analyzed for use in variant interpretation for all genes.   08/31/2020 Imaging   CT CAP w/o contrast  IMPRESSION: 1. No specific findings of active malignancy on today's noncontrast CT. There has been interval partial colectomy involving the tumor site with reanastomosis. Some faint stranding in the adjacent omentum is probably from scarring or fat necrosis, less likely due to early tumor given the lack of overt nodularity. Surveillance imaging is likely indicated, and PET-CT may have a role in imaging follow up. 2. Several small pulmonary nodules, in addition to a larger mixed density nodule in the left upper lobe. Surveillance is recommended. 3. Other imaging findings of potential clinical significance: Aortic Atherosclerosis (ICD10-I70.0). Coronary atherosclerosis. Mitral valve calcification. Small type 1 hiatal hernia. Splenectomy with small amount of regenerative splenic tissue as well as a venous varix arising from the splenic vein. Hyperdense left kidney lower pole exophytic homogeneous lesion, probably a complex cyst. Sigmoid colon diverticulosis. Multilevel lumbar spondylosis and degenerative disc disease.   Cancer of left colon ([place])  04/23/2020 Procedure   Upper Endoscopy and Colonoscopy by Dr Carlean Purl IMPRESSION Erythematous mucosa in the antrum. Biopsied. - Small hiatal hernia. - Gastroesophageal flap valve classified as [place] Grade IV (no fold, wide open lumen, hiatal hernia present). - The examination was otherwise normal.   IMPRESSION - Decreased sphincter tone found on digital rectal exam. - Malignant partially obstructing tumor in the proximal descending colon. Biopsied. Tattooed. - Three 5 to 20 mm polyps in the sigmoid colon, removed piecemeal using a cold  snare. Resected and retrieved. - Severe diverticulosis in the sigmoid colon. - I left several diminutive descending (proximal to tattoo), sigmoid and rectal polyps - The examination was otherwise normal on direct and retroflexion views.     04/23/2020 Initial Diagnosis   Diagnosis 1. Descending Colon Polyp, mass - ADENOCARCINOMA. 2. Sigmoid Colon Polyp, (3) - MULTIPLE FRAGMENTS OF TUBULAR ADENOMA. - MULTIPLE FRAGMENTS OF SESSILE SERRATED POLYP WITHOUT DYSPLASIA. - NO HIGH GRADE DYSPLASIA OR MALIGNANCY. Microscopic Comment 1. Dr. Vic Ripper has reviewed the case. Dr. Carlean Purl was notified on 04/24/2020. 2. Despite being left sided the fragments have features of a sessile serrated polyp, as opposed to, hyperplastic polyp.   04/27/2020 Imaging   CT AP IMPRESSION: Bulky annular soft tissue mass involving the distal transverse colon with extension into adjacent pericolonic fat, consistent with primary colon carcinoma.   No evidence of metastatic disease.   Colonic diverticulosis. No radiographic evidence of diverticulitis.   Aortic Atherosclerosis (ICD10-I70.0).     05/05/2020 Imaging   CT Chest IMPRESSION: Bulky annular soft tissue mass involving the distal transverse colon with extension into adjacent pericolonic fat, consistent with primary colon carcinoma.   No evidence of metastatic disease.   Colonic diverticulosis. No radiographic evidence of diverticulitis.   Aortic Atherosclerosis (ICD10-I70.0).     05/28/2020 Initial Diagnosis   Cancer of left colon ([place])   05/28/2020 Cancer Staging   Staging form: Colon and Rectum, AJCC 8th Edition - Pathologic stage from 05/28/2020: Stage IIA (pT3, pN0, cM0) - Signed by Truitt Merle, MD on 06/19/2020 Stage prefix: Initial diagnosis Total  positive nodes: 0 Histologic grading system: 4 grade system Histologic grade (G): G3 Residual tumor (R): R0 - None    05/28/2020 Surgery   XI ROBOT ASSISTED RESECTION OF SPLENIC FLEXURE by Dr  Marcello Moores    FINAL MICROSCOPIC DIAGNOSIS:   A. COLON, SPLENIC FLEXURE, RESECTION:  - Invasive adenocarcinoma, poorly differentiated, spanning 9.7 cm.  - Tumor invades through muscularis propria into pericolonic tissue.  - Resection margins are negative.  - Tubular adenoma (X1).  - Sessile serrate polyp without dysplasia (x2).  - Diverticulosis.  - Polypectomy scar.  - Twenty-four of twenty-four lymph nodes negative for carcinoma (0/24).  - See oncology table.    07/07/2020 Miscellaneous   Guardant Reveal ctDNA positive MSI high   07/30/2020 Genetic Testing   Negative genetic testing:  No pathogenic variants detected on the [employer] CancerNext-Expanded + RNAinsight panel. The report date is 07/30/2020.   The CancerNext-Expanded + RNAinsight gene panel offered by [employer] and includes sequencing and rearrangement analysis for the following 77 genes: AIP, ALK, APC, ATM, AXIN2, BAP1, BARD1, BLM, BMPR1A, BRCA1, BRCA2, BRIP1, CDC73, CDH1, CDK4, CDKN1B, CDKN2A, CHEK2, CTNNA1, DICER1, FANCC, FH, FLCN, GALNT12, KIF1B, LZTR1, MAX, MEN1, MET, MLH1, MSH2, MSH3, MSH6, MUTYH, NBN, NF1, NF2, NTHL1, PALB2, PHOX2B, PMS2, POT1, PRKAR1A, PTCH1, PTEN, RAD51C, RAD51D, RB1, RECQL, RET, SDHA, SDHAF2, SDHB, SDHC, SDHD, SMAD4, SMARCA4, SMARCB1, SMARCE1, STK11, SUFU, TMEM127, TP53, TSC1, TSC2, VHL and XRCC2 (sequencing and deletion/duplication); EGFR, EGLN1, HOXB13, KIT, MITF, PDGFRA, POLD1 and POLE (sequencing only); EPCAM and GREM1 (deletion/duplication only). RNA data is routinely analyzed for use in variant interpretation for all genes.   08/31/2020 Imaging   CT CAP w/o contrast  IMPRESSION: 1. No specific findings of active malignancy on today's noncontrast CT. There has been interval partial colectomy involving the tumor site with reanastomosis. Some faint stranding in the adjacent omentum is probably from scarring or fat necrosis, less likely due to early tumor given the lack of overt nodularity.  Surveillance imaging is likely indicated, and PET-CT may have a role in imaging follow up. 2. Several small pulmonary nodules, in addition to a larger mixed density nodule in the left upper lobe. Surveillance is recommended. 3. Other imaging findings of potential clinical significance: Aortic Atherosclerosis (ICD10-I70.0). Coronary atherosclerosis. Mitral valve calcification. Small type 1 hiatal hernia. Splenectomy with small amount of regenerative splenic tissue as well as a venous varix arising from the splenic vein. Hyperdense left kidney lower pole exophytic homogeneous lesion, probably a complex cyst. Sigmoid colon diverticulosis. Multilevel lumbar spondylosis and degenerative disc disease.   10/02/2020 -  Chemotherapy   Patient is on Treatment Plan : COLORECTAL Pembrolizumab q21d     03/01/2021 Imaging   EXAM: CT CHEST, ABDOMEN AND PELVIS WITHOUT CONTRAST  IMPRESSION: 1. Mixed attenuation lesion posterior left upper lobe has areas of more confluent and enlarging nodularity today. Interval progression raises concern for neoplasm. Consider PET-CT to further evaluate. 2. Stable 7 mm right lower lobe pulmonary nodule with a slightly more conspicuous 3 mm nodule in the right lower lobe. Continued close attention on follow-up recommended. 3. No evidence for metastatic disease in the abdomen or pelvis. 4. Aortic Atherosclerosis (ICD10-I70.0).      INTERVAL HISTORY:  Briana Ochoa is here for a follow up of colon cancer. She was last seen by me on 03/03/21. She presents to the clinic alone. She reports she is doing well overall. She notes continued taste change, which is stable.   All other systems were reviewed with the patient and  are negative.  MEDICAL HISTORY:  Past Medical History:  Diagnosis Date   Allergy    seasonal allergies   Anemia    on meds   Aortic atherosclerosis (HCC)    Bladder cancer ([place]) 2020   Bladder tumor    Bloody diarrhea 09/29/2017   Breast  cancer ([place]) 2015   Left Breast Cancer   Cataract    bilateral -sx   CKD (chronic kidney disease), stage III ([place])    DM related   Colon cancer ([place])    Diabetes mellitus type 2, diet-controlled ([place])    diet controlled   Dyspnea 09/28/2017   Dysuria    Family history of adverse reaction to anesthesia    aunt- N/V    Family history of breast cancer    Family history of kidney cancer    Family history of throat cancer    Family history of uterine cancer    Fatigue 09/28/2017   Frequency of urination    Grade I diastolic dysfunction 01/58/6825   Noted on ECHO   Gross hematuria 09/29/2017   Heart murmur    since rheumatic fever as child   Hematuria    History of cardiomegaly 02/12/2005   Mild, noted on CXR   History of CVA (cerebrovascular accident) 08/05/2014   post op cerebral angiogram, cerebral thrombosis w/ cerebral infarction;  11-02-2017  per pt no residuals   History of malignant melanoma of skin 12/2015   s/p wide local excision right lower leg (per pt localized)   History of rheumatic fever as a child    Hyperlipidemia    on meds   Hypertension    on meds   Intracranial aneurysm dx 07/ 2015   paraophthalmic RICA aneurysm /   s/p  Pipeline embolization right ICA 01-09-2014   Jaundice 09/28/2017   Malignant neoplasm of central portion of left breast in female, estrogen receptor positive [facility]) oncologist-  dr Lindi Adie   dx 07/ 2015--- DCIS, ER/PR positive-- s/p  left breast lumpectomy 09-19-2013,  completed radiation 11-12-2013,  started arimidex 11-12-2013   Melanoma ([place])    righ tleg    OA (osteoarthritis)    knees, hands, R shoulder   Osteopenia 10/16/2012   Diffuse   Personal history of radiation therapy    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   Sigmoid diverticulosis    Weakness 09/28/2017   Wears dentures    upper   Wears glasses    Wears hearing aid in both ears     SURGICAL HISTORY: Past Surgical History:  Procedure Laterality Date    BREAST LUMPECTOMY Left 2015   BREAST LUMPECTOMY WITH NEEDLE LOCALIZATION Left 09/19/2013   Procedure: LEFT BREAST LUMPECTOMY WITH DOUBLE WIRE BRACKETED  NEEDLE LOCALIZATION;  Surgeon: [name], MD;  Location: [place];  Service: General;  Laterality: Left;   CATARACT EXTRACTION W/ INTRAOCULAR LENS  IMPLANT, BILATERAL  06/2015   CESAREAN SECTION  1964   CHOLECYSTECTOMY OPEN  1970s   CYSTOSCOPY W/ URETERAL STENT PLACEMENT Left 11/07/2017   Procedure: CYSTOSCOPY WITH RETROGRADE PYELOGRAM/URETERAL STENT PLACEMENT;  Surgeon: Cleon Gustin, MD;  Location: [facility];  Service: Urology;  Laterality: Left;   CYSTOSCOPY W/ URETERAL STENT PLACEMENT Bilateral 04/27/2018   Procedure: CYSTOSCOPY WITH RETROGRADE PYELOGRAM;  Surgeon: Cleon Gustin, MD;  Location: [facility];  Service: Urology;  Laterality: Bilateral;   FINGER SURGERY     lt thumb  from dog bite  IR  NEPHROURETERAL CATH PLACE LEFT  04/28/2018   MELANOMA EXCISION Right 12/28/2015   Procedure: EXCISION MELANOMA RIGHT LOWER LEG;  Surgeon: Fanny Skates, MD;  Location: [place];  Service: General;  Laterality: Right;   RADIOLOGY WITH ANESTHESIA N/A 01/09/2014   Procedure: Embolization;  Surgeon: Consuella Lose, MD;  Location: [place];  Service: Radiology;  Laterality: N/A;   SPLENECTOMY, TOTAL  2009 approx.   splen injury due to fall off horse   TONSILLECTOMY  child   TRANSURETHRAL RESECTION OF BLADDER TUMOR N/A 11/07/2017   Procedure: TRANSURETHRAL RESECTION OF BLADDER TUMOR (TURBT), POSSIBLE STENT PLACEMENT;  Surgeon: Cleon Gustin, MD;  Location: [facility];  Service: Urology;  Laterality: N/A;   TRANSURETHRAL RESECTION OF BLADDER TUMOR N/A 12/14/2017   Procedure: TRANSURETHRAL RESECTION OF BLADDER TUMOR (TURBT);  Surgeon: Cleon Gustin, MD;  Location: [facility];  Service: Urology;  Laterality: N/A;  [place] TUMOR N/A 04/27/2018   Procedure: TRANSURETHRAL RESECTION OF BLADDER TUMOR (TURBT);  Surgeon: Cleon Gustin, MD;  Location: [facility];  Service: Urology;  Laterality: N/A;  29 MINS    I have reviewed the social history and family history with the patient and they are unchanged from previous note.  ALLERGIES:  is allergic to feraheme [ferumoxytol].  MEDICATIONS:  Current Outpatient Medications  Medication Sig Dispense Refill   acetaminophen (TYLENOL) 500 MG tablet Take 1,000 mg by mouth daily.     capecitabine (XELODA) 500 MG tablet Take 2 tablets (1000 mg total) by mouth in morning and 3 tablets (1500 mg total) by mouth in evening. Take within 30 minutes after meals. Take for 14 days on and 7 days off. Repeat every 21 days. 70 tablet 1   glucose blood (TRUE METRIX BLOOD GLUCOSE TEST) test strip Use as instructed to test blood sugar once daily E08.638 100 each 2   lisinopril (ZESTRIL) 20 MG tablet TAKE 1 TABLET EVERY DAY 90 tablet 3   ondansetron (ZOFRAN) 8 MG tablet Take 1 tablet (8 mg total) by mouth every 8 (eight) hours as needed for nausea or vomiting. 20 tablet 0   pravastatin (PRAVACHOL) 10 MG tablet TAKE 1 TABLET EVERY DAY (STOP PRAVASTATIN 20MG) 90 tablet 3   predniSONE (STERAPRED UNI-PAK 21 TAB) 10 MG (21) TBPK tablet Take 4 tabs daily for 2 days then drip one tab every 2 days until finish. 21 tablet 0   No current facility-administered medications for this visit.    PHYSICAL EXAMINATION: ECOG PERFORMANCE STATUS: 0 - Asymptomatic  There were no vitals filed for this visit. Wt Readings from Last 3 Encounters:  03/03/21 174 lb 1.6 oz (79 kg)  02/10/21 172 lb 11.2 oz (78.3 kg)  12/25/20 170 lb 9.6 oz (77.4 kg)     GENERAL:alert, no distress and comfortable SKIN: skin color normal, no rashes or significant lesions EYES: normal, Conjunctiva are pink and non-injected, sclera clear  NEURO: alert & oriented x 3 with fluent speech  LABORATORY DATA:   I have reviewed the data as listed CBC Latest Ref Rng & Units 03/01/2021 02/10/2021 12/25/2020  WBC 4.0 - 10.5 K/uL 8.8 8.6 10.4  Hemoglobin 12.0 - 15.0 g/dL 14.1 14.6 15.1(H)  Hematocrit 36.0 - 46.0 % 42.6 44.3 46.5(H)  Platelets 150 - 400 K/uL 290 310 267     CMP Latest Ref Rng & Units 03/01/2021 02/10/2021 12/25/2020  Glucose 70 - 99 mg/dL 109(H) 106(H) 90  BUN 8 - 23 mg/dL 25(H) 29(H) 23  Creatinine 0.44 - 1.00 mg/dL 1.48(H) 1.66(H) 1.47(H)  Sodium 135 - 145 mmol/L 137 140 140  Potassium 3.5 - 5.1 mmol/L 4.9 4.5 4.7  Chloride 98 - 111 mmol/L 106 108 107  CO2 22 - 32 mmol/L 24 24 24   Calcium 8.9 - 10.3 mg/dL 8.9 9.2 9.3  Total Protein 6.5 - 8.1 g/dL 6.6 6.8 7.4  Total Bilirubin 0.3 - 1.2 mg/dL 0.4 0.3 <0.2(L)  Alkaline Phos 38 - 126 U/L 65 80 102  AST 15 - 41 U/L 19 12(L) 15  ALT 0 - 44 U/L 12 8 10       RADIOGRAPHIC STUDIES: I have personally reviewed the radiological images as listed and agreed with the findings in the report. No results found.    Orders Placed This Encounter  Procedures   Ambulatory referral to Pulmonology    Referral Priority:   Routine    Referral Type:   Consultation    Referral Reason:   Specialty Services Required    Requested Specialty:   Pulmonary Disease    Number of Visits Requested:   1   All questions were answered. The patient knows to call the clinic with any problems, questions or concerns. No barriers to learning was detected. The total time spent in the appointment was 30 minutes.     Truitt Merle, MD 03/26/2021   I, Wilburn Mylar, am acting as scribe for Truitt Merle, MD.   I have reviewed the above documentation for accuracy and completeness, and I agree with the above.

## 2021-03-27 ENCOUNTER — Encounter: Payer: Self-pay | Admitting: Hematology

## 2021-03-27 ENCOUNTER — Other Ambulatory Visit: Payer: Self-pay | Admitting: Hematology

## 2021-03-27 DIAGNOSIS — E538 Deficiency of other specified B group vitamins: Secondary | ICD-10-CM | POA: Insufficient documentation

## 2021-03-29 LAB — VITAMIN B6: Vitamin B6: 4.8 ug/L (ref 3.4–65.2)

## 2021-03-30 ENCOUNTER — Telehealth: Payer: Self-pay | Admitting: Hematology

## 2021-03-30 ENCOUNTER — Telehealth: Payer: Self-pay

## 2021-03-30 ENCOUNTER — Other Ambulatory Visit (HOSPITAL_COMMUNITY): Payer: Self-pay

## 2021-03-30 NOTE — Telephone Encounter (Signed)
-----   Message from Truitt Merle, MD sent at 03/27/2021  4:23 PM EST ----- Please let pt know that her B12 level was very low, which could be the reason she has taste changes in mouth. I recommend B12 injection weeklyX8 then monthly. Please schedule to start if she agrees. Order is in .   Truitt Merle  03/27/2021

## 2021-03-30 NOTE — Telephone Encounter (Signed)
Left message with follow-up appointments per 2/17 los.

## 2021-03-30 NOTE — Telephone Encounter (Signed)
This nurse reached out to this patient and made aware of her Lab levels and recommendations from the provider to begin Vit. B12 injections weekly for 8 weeks and then Monthly.  Patient is in agreement with providers plan.  This nurse informed her that scheduling will reach out to her to schedule the plan.  No further questions or concerns at this time.

## 2021-03-30 NOTE — Telephone Encounter (Signed)
Sch per 2/21 inbasket, left pt message

## 2021-04-02 ENCOUNTER — Encounter: Payer: Self-pay | Admitting: Pulmonary Disease

## 2021-04-02 ENCOUNTER — Other Ambulatory Visit: Payer: Self-pay

## 2021-04-02 ENCOUNTER — Ambulatory Visit: Payer: Medicare PPO | Admitting: Pulmonary Disease

## 2021-04-02 VITALS — BP 122/70 | HR 74 | Temp 97.8°F | Ht 64.0 in | Wt 173.0 lb

## 2021-04-02 DIAGNOSIS — Z85038 Personal history of other malignant neoplasm of large intestine: Secondary | ICD-10-CM | POA: Insufficient documentation

## 2021-04-02 DIAGNOSIS — Z8551 Personal history of malignant neoplasm of bladder: Secondary | ICD-10-CM

## 2021-04-02 DIAGNOSIS — Z800 Family history of malignant neoplasm of digestive organs: Secondary | ICD-10-CM

## 2021-04-02 DIAGNOSIS — R911 Solitary pulmonary nodule: Secondary | ICD-10-CM

## 2021-04-02 DIAGNOSIS — Z8049 Family history of malignant neoplasm of other genital organs: Secondary | ICD-10-CM

## 2021-04-02 DIAGNOSIS — Z803 Family history of malignant neoplasm of breast: Secondary | ICD-10-CM

## 2021-04-02 DIAGNOSIS — Z853 Personal history of malignant neoplasm of breast: Secondary | ICD-10-CM | POA: Insufficient documentation

## 2021-04-02 DIAGNOSIS — R918 Other nonspecific abnormal finding of lung field: Secondary | ICD-10-CM | POA: Diagnosis present

## 2021-04-02 DIAGNOSIS — Z87891 Personal history of nicotine dependence: Secondary | ICD-10-CM

## 2021-04-02 DIAGNOSIS — Z8051 Family history of malignant neoplasm of kidney: Secondary | ICD-10-CM | POA: Diagnosis not present

## 2021-04-02 NOTE — Progress Notes (Signed)
Synopsis: Referred in February 2023 for lung nodule by Nche, [name], NP  Subjective:   PATIENT ID: Briana Ochoa GENDER: female DOB: 13-Feb-1940, MRN: [id number]  Chief Complaint  Patient presents with   Consult    Patient is here to talk about lung nodules.     This is a 81 year old female, past medical history of bladder cancer, breast cancer, colon cancer.She had a stage IIa colon cancer and a ductal carcinoma in situ.  Patient is being followed by medical oncology.  She had recent restaging CT scan chest abdomen pelvis which revealed progressive enlargement of a left upper lobe nodule.On nuclear medicine pet imaging completed 03/17/2021 revealed a part solid lesion within the posterior left upper lobe with an SUV max of 2.5 due to the progression and change of the lesion there was concern for indolent neoplasm.  Patient was referred for discussion of robotic assisted navigational bronchoscopy and tissue sampling.   Oncology History Overview Note  Cancer Staging Cancer of left colon [facility]) Staging form: Colon and Rectum, AJCC 8th Edition - Pathologic stage from 05/28/2020: Stage IIA (pT3, pN0, cM0) - Signed by Truitt Merle, MD on 06/19/2020 Stage prefix: Initial diagnosis Total positive nodes: 0 Histologic grading system: 4 grade system Histologic grade (G): G3 Residual tumor (R): R0 - None  Ductal carcinoma in situ (DCIS) of left breast Staging form: Breast, AJCC 7th Edition - Clinical: Stage 0 (Tis (DCIS), N0, cM0) - Unsigned Specimen type: Core Needle Biopsy Histopathologic type: 9932 Laterality: Left Staging comments: Staged at breast conference 7.22.15  - Pathologic: Stage 0 (Tis (DCIS)(2), N0, cM0) - Unsigned Specimen type: Core Needle Biopsy Histopathologic type: 9932 Laterality: Left Tumor size (mm): 3 Multiple tumors: Yes Number of tumors: 2 Method of lymph node assessment: Clinical Method of detection of distant metastases: Clinical Residual tumor (R): R0  - None Estrogen receptor status: Positive Progesterone receptor status: Positive    Ductal carcinoma in situ (DCIS) of left breast  08/20/2013 Initial Diagnosis   Left breast 12:00: DCIS with calcifications, yet 100%, PR 100%; 09/04/2013 second group of calcifications also biopsied proven to be DCIS ER/PR positive   09/19/2013 Surgery   Left breast lumpectomy DCIS 2 foci margins negative, 0.2 and 0.3 cm ER 100% PR 100%   10/08/2013 - 11/12/2013 Radiation Therapy   Adjuvant radiation therapy   11/12/2013 - 2020 Anti-estrogen oral therapy   Anastrozole 1 mg daily plan is for 5 years   01/09/2014 - 01/10/2014 Hospital Admission   Pipeline embolization 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 The report date is 07/30/2020.   The CancerNext-Expanded + RNAinsight gene panel offered by [employer] and includes sequencing and rearrangement analysis for the following 77 genes: AIP, ALK, APC, ATM, AXIN2, BAP1, BARD1, BLM, BMPR1A, BRCA1, BRCA2, BRIP1, CDC73, CDH1, CDK4, CDKN1B, CDKN2A, CHEK2, CTNNA1, DICER1, FANCC, FH, FLCN, GALNT12, KIF1B, LZTR1, MAX, MEN1, MET, MLH1, MSH2, MSH3, MSH6, MUTYH, NBN, NF1, NF2, NTHL1, PALB2, PHOX2B, PMS2, POT1, PRKAR1A, PTCH1, PTEN, RAD51C, RAD51D, RB1, RECQL, RET, SDHA, SDHAF2, SDHB, SDHC, SDHD, SMAD4, SMARCA4, SMARCB1, SMARCE1, STK11, SUFU, TMEM127, TP53, TSC1, TSC2, VHL and XRCC2 (sequencing and deletion/duplication); EGFR, EGLN1, HOXB13, KIT, MITF, PDGFRA, POLD1 and POLE (sequencing only); EPCAM and GREM1 (deletion/duplication only). RNA data is routinely analyzed for use in variant interpretation for all genes.   Bladder cancer ([place])  02/09/2018 Initial Diagnosis   Bladder cancer ([place])  07/30/2020 Genetic Testing   Negative genetic testing:  No pathogenic variants detected on the [employer] CancerNext-Expanded + RNAinsight panel. The report date is  07/30/2020.   The CancerNext-Expanded + RNAinsight gene panel offered by [employer] and includes sequencing and rearrangement analysis for the following 77 genes: AIP, ALK, APC, ATM, AXIN2, BAP1, BARD1, BLM, BMPR1A, BRCA1, BRCA2, BRIP1, CDC73, CDH1, CDK4, CDKN1B, CDKN2A, CHEK2, CTNNA1, DICER1, FANCC, FH, FLCN, GALNT12, KIF1B, LZTR1, MAX, MEN1, MET, MLH1, MSH2, MSH3, MSH6, MUTYH, NBN, NF1, NF2, NTHL1, PALB2, PHOX2B, PMS2, POT1, PRKAR1A, PTCH1, PTEN, RAD51C, RAD51D, RB1, RECQL, RET, SDHA, SDHAF2, SDHB, SDHC, SDHD, SMAD4, SMARCA4, SMARCB1, SMARCE1, STK11, SUFU, TMEM127, TP53, TSC1, TSC2, VHL and XRCC2 (sequencing and deletion/duplication); EGFR, EGLN1, HOXB13, KIT, MITF, PDGFRA, POLD1 and POLE (sequencing only); EPCAM and GREM1 (deletion/duplication only). RNA data is routinely analyzed for use in variant interpretation for all genes.   08/31/2020 Imaging   CT CAP w/o contrast  IMPRESSION: 1. No specific findings of active malignancy on today's noncontrast CT. There has been interval partial colectomy involving the tumor site with reanastomosis. Some faint stranding in the adjacent omentum is probably from scarring or fat necrosis, less likely due to early tumor given the lack of overt nodularity. Surveillance imaging is likely indicated, and PET-CT may have a role in imaging follow up. 2. Several small pulmonary nodules, in addition to a larger mixed density nodule in the left upper lobe. Surveillance is recommended. 3. Other imaging findings of potential clinical significance: Aortic Atherosclerosis (ICD10-I70.0). Coronary atherosclerosis. Mitral valve calcification. Small type 1 hiatal hernia. Splenectomy with small amount of regenerative splenic tissue as well as a venous varix arising from the splenic vein. Hyperdense left kidney lower pole exophytic homogeneous lesion, probably a complex cyst. Sigmoid colon diverticulosis. Multilevel lumbar spondylosis and degenerative disc disease.    Cancer of left colon ([place])  04/23/2020 Procedure   Upper Endoscopy and Colonoscopy by Dr Carlean Purl IMPRESSION Erythematous mucosa in the antrum. Biopsied. - Small hiatal hernia. - Gastroesophageal flap valve classified as Hill Grade IV (no fold, wide open lumen, hiatal hernia present). - The examination was otherwise normal.   IMPRESSION - Decreased sphincter tone found on digital rectal exam. - Malignant partially obstructing tumor in the proximal descending colon. Biopsied. Tattooed. - Three 5 to 20 mm polyps in the sigmoid colon, removed piecemeal using a cold snare. Resected and retrieved. - Severe diverticulosis in the sigmoid colon. - I left several diminutive descending (proximal to tattoo), sigmoid and rectal polyps - The examination was otherwise normal on direct and retroflexion views.     04/23/2020 Initial Diagnosis   Diagnosis 1. Descending Colon Polyp, mass - ADENOCARCINOMA. 2. Sigmoid Colon Polyp, (3) - MULTIPLE FRAGMENTS OF TUBULAR ADENOMA. - MULTIPLE FRAGMENTS OF SESSILE SERRATED POLYP WITHOUT DYSPLASIA. - NO HIGH GRADE DYSPLASIA OR MALIGNANCY. Microscopic Comment 1. Dr. Vic Ripper has reviewed the case. Dr. Carlean Purl was notified on 04/24/2020. 2. Despite being left sided the fragments have features of a sessile serrated polyp, as opposed to, hyperplastic polyp.   04/27/2020 Imaging   CT AP IMPRESSION: Bulky annular soft tissue mass involving the distal transverse colon with extension into adjacent pericolonic fat, consistent with primary colon carcinoma.   No evidence of metastatic disease.   Colonic diverticulosis. No radiographic evidence of diverticulitis.   Aortic Atherosclerosis (ICD10-I70.0).     05/05/2020 Imaging   CT Chest IMPRESSION: Bulky annular soft tissue mass involving the distal transverse colon with extension into adjacent pericolonic fat, consistent with primary colon carcinoma.  No evidence of metastatic disease.   Colonic  diverticulosis. No radiographic evidence of diverticulitis.   Aortic Atherosclerosis (ICD10-I70.0).     05/28/2020 Initial Diagnosis   Cancer of left colon ([place])   05/28/2020 Cancer Staging   Staging form: Colon and Rectum, AJCC 8th Edition - Pathologic stage from 05/28/2020: Stage IIA (pT3, pN0, cM0) - Signed by Truitt Merle, MD on 06/19/2020 Stage prefix: Initial diagnosis Total positive nodes: 0 Histologic grading system: 4 grade system Histologic grade (G): G3 Residual tumor (R): R0 - None    05/28/2020 Surgery   XI ROBOT ASSISTED RESECTION OF SPLENIC FLEXURE by Dr Marcello Moores    FINAL MICROSCOPIC DIAGNOSIS:   A. COLON, SPLENIC FLEXURE, RESECTION:  - Invasive adenocarcinoma, poorly differentiated, spanning 9.7 cm.  - Tumor invades through muscularis propria into pericolonic tissue.  - Resection margins are negative.  - Tubular adenoma (X1).  - Sessile serrate polyp without dysplasia (x2).  - Diverticulosis.  - Polypectomy scar.  - Twenty-four of twenty-four lymph nodes negative for carcinoma (0/24).  - See oncology table.    07/07/2020 Miscellaneous   Guardant Reveal ctDNA positive MSI high   07/30/2020 Genetic Testing   Negative genetic testing:  No pathogenic variants detected on the [employer] CancerNext-Expanded + RNAinsight panel. The report date is 07/30/2020.   The CancerNext-Expanded + RNAinsight gene panel offered by [employer] and includes sequencing and rearrangement analysis for the following 77 genes: AIP, ALK, APC, ATM, AXIN2, BAP1, BARD1, BLM, BMPR1A, BRCA1, BRCA2, BRIP1, CDC73, CDH1, CDK4, CDKN1B, CDKN2A, CHEK2, CTNNA1, DICER1, FANCC, FH, FLCN, GALNT12, KIF1B, LZTR1, MAX, MEN1, MET, MLH1, MSH2, MSH3, MSH6, MUTYH, NBN, NF1, NF2, NTHL1, PALB2, PHOX2B, PMS2, POT1, PRKAR1A, PTCH1, PTEN, RAD51C, RAD51D, RB1, RECQL, RET, SDHA, SDHAF2, SDHB, SDHC, SDHD, SMAD4, SMARCA4, SMARCB1, SMARCE1, STK11, SUFU, TMEM127, TP53, TSC1, TSC2, VHL and XRCC2 (sequencing and deletion/duplication);  EGFR, EGLN1, HOXB13, KIT, MITF, PDGFRA, POLD1 and POLE (sequencing only); EPCAM and GREM1 (deletion/duplication only). RNA data is routinely analyzed for use in variant interpretation for all genes.   08/31/2020 Imaging   CT CAP w/o contrast  IMPRESSION: 1. No specific findings of active malignancy on today's noncontrast CT. There has been interval partial colectomy involving the tumor site with reanastomosis. Some faint stranding in the adjacent omentum is probably from scarring or fat necrosis, less likely due to early tumor given the lack of overt nodularity. Surveillance imaging is likely indicated, and PET-CT may have a role in imaging follow up. 2. Several small pulmonary nodules, in addition to a larger mixed density nodule in the left upper lobe. Surveillance is recommended. 3. Other imaging findings of potential clinical significance: Aortic Atherosclerosis (ICD10-I70.0). Coronary atherosclerosis. Mitral valve calcification. Small type 1 hiatal hernia. Splenectomy with small amount of regenerative splenic tissue as well as a venous varix arising from the splenic vein. Hyperdense left kidney lower pole exophytic homogeneous lesion, probably a complex cyst. Sigmoid colon diverticulosis. Multilevel lumbar spondylosis and degenerative disc disease.   10/02/2020 -  Chemotherapy   Patient is on Treatment Plan : COLORECTAL Pembrolizumab q21d     03/01/2021 Imaging   EXAM: CT CHEST, ABDOMEN AND PELVIS WITHOUT CONTRAST  IMPRESSION: 1. Mixed attenuation lesion posterior left upper lobe has areas of more confluent and enlarging nodularity today. Interval progression raises concern for neoplasm. Consider PET-CT to further evaluate. 2. Stable 7 mm right lower lobe pulmonary nodule with a slightly more conspicuous 3 mm nodule in the right lower lobe. Continued close attention on follow-up recommended. 3. No evidence for  metastatic disease in the abdomen or pelvis. 4. Aortic  Atherosclerosis (ICD10-I70.0).   03/17/2021 PET scan   IMPRESSION: 1. Part solid nodule of the left upper lobe demonstrates mild FDG uptake, and has increased in size when compared with prior exam dated May 05, 2020. Concerning for indolent primary lung neoplasm. 2. No evidence of metastatic disease in the chest, abdomen or pelvis. 3. 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 Recommend further evaluation with tissue sampling. 4. Aortic Atherosclerosis (ICD10-I70.0).     Past Medical History:  Diagnosis Date   Allergy    seasonal allergies   Anemia    on meds   Aortic atherosclerosis (HCC)    Bladder cancer ([place]) 2020   Bladder tumor    Bloody diarrhea 09/29/2017   Breast cancer ([place]) 2015   Left Breast Cancer   Cataract    bilateral -sx   CKD (chronic kidney disease), stage III ([place])    DM related   Colon cancer ([place])    Diabetes mellitus type 2, diet-controlled ([place])    diet controlled   Dyspnea 09/28/2017   Dysuria    Family history of adverse reaction to anesthesia    aunt- N/V    Family history of breast cancer    Family history of kidney cancer    Family history of throat cancer    Family history of uterine cancer    Fatigue 09/28/2017   Frequency of urination    Grade I diastolic dysfunction 91/69/4503   Noted on ECHO   Gross hematuria 09/29/2017   Heart murmur    since rheumatic fever as child   Hematuria    History of cardiomegaly 02/12/2005   Mild, noted on CXR   History of CVA (cerebrovascular accident) 08/05/2014   post op cerebral angiogram, cerebral thrombosis w/ cerebral infarction;  11-02-2017  per pt no residuals   History of malignant melanoma of skin 12/2015   s/p wide local excision right lower leg (per pt localized)   History of rheumatic fever as a child    Hyperlipidemia    on meds   Hypertension    on meds   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   Sigmoid diverticulosis    Weakness 09/28/2017   Wears dentures    upper   Wears glasses    Wears hearing aid in both ears      Family History  Problem Relation Age of Onset   Hypertension Mother    Kidney cancer Mother        dx early 67s   Heart attack Paternal Grandfather    Throat cancer Brother 80       hx smoking   Head & neck cancer Son 77       jaw cancer   Breast cancer Maternal Aunt 80   Breast cancer Paternal  Aunt 51   Uterine cancer Cousin        dx early 3s (maternal first cousin)   Colon polyps Neg Hx    Colon cancer Neg Hx    Esophageal cancer Neg Hx    Rectal cancer Neg Hx    Stomach cancer Neg Hx      Past Surgical History:  Procedure Laterality Date   BREAST LUMPECTOMY Left 2015   BREAST LUMPECTOMY WITH NEEDLE LOCALIZATION Left 09/19/2013   Procedure: LEFT BREAST LUMPECTOMY WITH DOUBLE WIRE BRACKETED  NEEDLE LOCALIZATION;  Surgeon: [name], MD;  Location: [place];  Service: General;  Laterality: Left;   CATARACT EXTRACTION W/ INTRAOCULAR LENS  IMPLANT, BILATERAL  06/2015   CESAREAN SECTION  1964   CHOLECYSTECTOMY OPEN  1970s   CYSTOSCOPY W/ URETERAL STENT PLACEMENT Left 11/07/2017   Procedure: CYSTOSCOPY WITH RETROGRADE PYELOGRAM/URETERAL STENT PLACEMENT;  Surgeon: Cleon Gustin, MD;  Location: [facility];  Service: Urology;  Laterality: Left;   CYSTOSCOPY W/ URETERAL STENT PLACEMENT  Bilateral 04/27/2018   Procedure: CYSTOSCOPY WITH RETROGRADE PYELOGRAM;  Surgeon: Cleon Gustin, MD;  Location: [facility];  Service: Urology;  Laterality: Bilateral;   FINGER SURGERY     lt thumb  from dog bite   IR  NEPHROURETERAL CATH PLACE LEFT  04/28/2018   MELANOMA EXCISION Right 12/28/2015   Procedure: EXCISION MELANOMA RIGHT LOWER LEG;  Surgeon: Fanny Skates, MD;  Location: [place];  Service: General;  Laterality: Right;   RADIOLOGY WITH ANESTHESIA N/A 01/09/2014   Procedure: Embolization;  Surgeon: Consuella Lose, MD;  Location: [place];  Service: Radiology;  Laterality: N/A;   SPLENECTOMY, TOTAL  2009 approx.   splen injury due to fall off horse   TONSILLECTOMY  child   TRANSURETHRAL RESECTION OF BLADDER TUMOR N/A 11/07/2017   Procedure: TRANSURETHRAL RESECTION OF BLADDER TUMOR (TURBT), POSSIBLE STENT PLACEMENT;  Surgeon: Cleon Gustin, MD;  Location: [facility];  Service: Urology;  Laterality: N/A;   TRANSURETHRAL RESECTION OF BLADDER TUMOR N/A 12/14/2017   Procedure: TRANSURETHRAL RESECTION OF BLADDER TUMOR (TURBT);  Surgeon: Cleon Gustin, MD;  Location: [facility];  Service: Urology;  Laterality: N/A;  [place] TUMOR N/A 04/27/2018   Procedure: TRANSURETHRAL RESECTION OF BLADDER TUMOR (TURBT);  Surgeon: Cleon Gustin, MD;  Location: [facility];  Service: Urology;  Laterality: N/A;  30 MINS    Social History   Socioeconomic History   Marital status: Widowed    Spouse name: Not on file   Number of children: 4   Years of education: Not on file   Highest education level: Not on file  Occupational History    Comment: Retired  Tobacco Use   Smoking status: Former    Packs/day: 1.50    Years: 40.00    Pack years: 60.00    Types: Cigarettes    Quit date: 03/06/2007    Years since quitting: 14.0   Smokeless tobacco: Never  Vaping Use    Vaping Use: Never used  Substance and Sexual Activity   Alcohol use: No   Drug use: Never   Sexual activity: Not on file  Other Topics Concern   Not on file  Social History Narrative   Widowed 2013 originally from [place] moved to [place] decades ago to work for [employer]   5 children   17 grandchildren      Does  not have a living will-desires CPR, does not want prolonged life support if futile.   Social Determinants of Health   Financial Resource Strain: Not on file  Food Insecurity: Not on file  Transportation Needs: Not on file  Physical Activity: Not on file  Stress: Not on file  Social Connections: Not on file  Intimate Partner Violence: Not on file     Allergies  Allergen Reactions   Feraheme [Ferumoxytol] Shortness Of Breath and Palpitations    Flushed face; palpitations, SOB     Outpatient Medications Prior to Visit  Medication Sig Dispense Refill   acetaminophen (TYLENOL) 500 MG tablet Take 1,000 mg by mouth daily.     capecitabine (XELODA) 500 MG tablet Take 2 tablets (1000 mg total) by mouth in morning and 3 tablets (1500 mg total) by mouth in evening. Take within 30 minutes after meals. Take for 14 days on and 7 days off. Repeat every 21 days. 70 tablet 1   glucose blood (TRUE METRIX BLOOD GLUCOSE TEST) test strip Use as instructed to test blood sugar once daily E08.638 100 each 2   lisinopril (ZESTRIL) 20 MG tablet TAKE 1 TABLET EVERY DAY 90 tablet 3   ondansetron (ZOFRAN) 8 MG tablet Take 1 tablet (8 mg total) by mouth every 8 (eight) hours as needed for nausea or vomiting. 20 tablet 0   pravastatin (PRAVACHOL) 10 MG tablet TAKE 1 TABLET EVERY DAY (STOP PRAVASTATIN 20MG) 90 tablet 3   predniSONE (STERAPRED UNI-PAK 21 TAB) 10 MG (21) TBPK tablet Take 4 tabs daily for 2 days then drip one tab every 2 days until finish. 21 tablet 0   No facility-administered medications prior to visit.    Review of Systems  Constitutional:  Negative for chills,  fever, malaise/fatigue and weight loss.  HENT:  Negative for hearing loss, sore throat and tinnitus.   Eyes:  Negative for blurred vision and double vision.  Respiratory:  Negative for cough, hemoptysis, sputum production, shortness of breath, wheezing and stridor.   Cardiovascular:  Negative for chest pain, palpitations, orthopnea, leg swelling and PND.  Gastrointestinal:  Negative for abdominal pain, constipation, diarrhea, heartburn, nausea and vomiting.  Genitourinary:  Negative for dysuria, hematuria and urgency.  Musculoskeletal:  Negative for joint pain and myalgias.  Skin:  Negative for itching and rash.  Neurological:  Negative for dizziness, tingling, weakness and headaches.  Endo/Heme/Allergies:  Negative for environmental allergies. Does not bruise/bleed easily.  Psychiatric/Behavioral:  Negative for depression. The patient is not nervous/anxious and does not have insomnia.   All other systems reviewed and are negative.   Objective:  Physical Exam Vitals reviewed.  Constitutional:      General: She is not in acute distress.    Appearance: She is well-developed.  HENT:     Head: Normocephalic and atraumatic.  Eyes:     General: No scleral icterus.    Conjunctiva/sclera: Conjunctivae normal.     Pupils: Pupils are equal, round, and reactive to light.  Neck:     Vascular: No JVD.     Trachea: No tracheal deviation.  Cardiovascular:     Rate and Rhythm: Normal rate and regular rhythm.     Heart sounds: Normal heart sounds. No murmur heard. Pulmonary:     Effort: Pulmonary effort is normal. No tachypnea, accessory muscle usage or respiratory distress.     Breath sounds: No stridor. No wheezing, rhonchi or rales.  Abdominal:     General: There is no distension.  Palpations: Abdomen is soft.     Tenderness: There is no abdominal tenderness.  Musculoskeletal:        General: No tenderness.     Cervical back: Neck supple.  Lymphadenopathy:     Cervical: No cervical  adenopathy.  Skin:    General: Skin is warm and dry.     Capillary Refill: Capillary refill takes less than 2 seconds.     Findings: No rash.  Neurological:     Mental Status: She is alert and oriented to person, place, and time.  Psychiatric:        Behavior: Behavior normal.     Vitals:   04/02/21 1355  BP: 122/70  Pulse: 74  Temp: 97.8 F (36.6 C)  TempSrc: Oral  SpO2: 99%  Weight: 173 lb (78.5 kg)  Height: _0  (1.626 m)   99% on RA BMI Readings from Last 3 Encounters:  04/02/21 29.70 kg/m  03/26/21 29.77 kg/m  03/03/21 29.88 kg/m   Wt Readings from Last 3 Encounters:  04/02/21 173 lb (78.5 kg)  03/26/21 173 lb 7 oz (78.7 kg)  03/03/21 174 lb 1.6 oz (79 kg)     CBC    Component Value Date/Time   WBC 8.9 03/26/2021 0921   WBC 13.5 (H) 06/01/2020 0433   RBC 4.22 03/26/2021 0921   HGB 14.3 03/26/2021 0921   HCT 41.0 03/26/2021 0921   PLT 264 03/26/2021 0921   MCV 97.2 03/26/2021 0921   MCV 96.0 03/10/2013 1312   MCH 33.9 03/26/2021 0921   MCHC 34.9 03/26/2021 0921   RDW 18.6 (H) 03/26/2021 0921   LYMPHSABS 1.1 03/26/2021 0921   MONOABS 1.2 (H) 03/26/2021 0921   EOSABS 0.4 03/26/2021 0921   BASOSABS 0.1 03/26/2021 0921     Chest Imaging: 03/17/2021 nuclear medicine pet imaging: Hypermetabolic lesion within the left upper lobe concerning for indolent neoplasm. The patient's images have been independently reviewed by me.    Pulmonary Functions Testing Results: No flowsheet data found.  FeNO:   Pathology:   Echocardiogram:   Heart Catheterization:     Assessment & Plan:     ICD-10-CM   1. Lung nodule  R91.1 Ambulatory referral to Pulmonology    Procedural/ Surgical Case Request: ROBOTIC ASSISTED NAVIGATIONAL BRONCHOSCOPY    2. Family history of kidney cancer  Z80.51     3. Family history of breast cancer  Z80.3     4. Family history of uterine cancer  Z80.49     5. Family history of throat cancer  Z80.0     6. History of breast  cancer  Z85.3     7. History of bladder cancer  Z85.51     8. History of colon cancer  Z85.038     9. History of tobacco use  Z87.891       Discussion:  This is an 81 year old female with a significant malignancy history.  Patient has had multiple malignancies and undergone several various treatments with oncology.  Now has a left upper lobe subsolid peripheral nodule concerning for indolent neoplasm.  We discussed her CT radiographic changes as well as a nuclear medicine PET scan that was recently completed  Plan: Today in the office we discussed the risk benefits and alternatives of proceeding with robotic assisted navigational bronchoscopy and tissue sampling.  Patient is agreeable to proceed with this.  We talked about the risk of bleeding and pneumothorax.  We also talked about the risk of a potential nondiagnostic study. Plan  will be to tentatively schedule bronchoscopy on 04/20/2021. Patient is agreeable to proceed. Orders have been placed.  I will have patient follow-up with [place] in clinic a week later to discuss pathology results.    Current Outpatient Medications:    acetaminophen (TYLENOL) 500 MG tablet, Take 1,000 mg by mouth daily., Disp: , Rfl:    capecitabine (XELODA) 500 MG tablet, Take 2 tablets (1000 mg total) by mouth in morning and 3 tablets (1500 mg total) by mouth in evening. Take within 30 minutes after meals. Take for 14 days on and 7 days off. Repeat every 21 days., Disp: 70 tablet, Rfl: 1   glucose blood (TRUE METRIX BLOOD GLUCOSE TEST) test strip, Use as instructed to test blood sugar once daily E08.638, Disp: 100 each, Rfl: 2   lisinopril (ZESTRIL) 20 MG tablet, TAKE 1 TABLET EVERY DAY, Disp: 90 tablet, Rfl: 3   ondansetron (ZOFRAN) 8 MG tablet, Take 1 tablet (8 mg total) by mouth every 8 (eight) hours as needed for nausea or vomiting., Disp: 20 tablet, Rfl: 0   pravastatin (PRAVACHOL) 10 MG tablet, TAKE 1 TABLET EVERY DAY (STOP PRAVASTATIN 20MG), Disp: 90  tablet, Rfl: 3   predniSONE (STERAPRED UNI-PAK 21 TAB) 10 MG (21) TBPK tablet, Take 4 tabs daily for 2 days then drip one tab every 2 days until finish., Disp: 21 tablet, Rfl: 0  I spent 63 minutes dedicated to the care of this patient on the date of this encounter to include pre-visit review of records, face-to-face time with the patient discussing conditions above, post visit ordering of testing, clinical documentation with the electronic health record, making appropriate referrals as documented, and communicating necessary findings to members of the patients care team.   Garner Nash, DO [facility] 04/02/2021 3:17 PM

## 2021-04-02 NOTE — Patient Instructions (Signed)
Thank you for visiting Dr. Valeta Harms at [facility]. Today we recommend the following:  Orders Placed This Encounter  Procedures   Procedural/ Surgical Case Request: ROBOTIC ASSISTED NAVIGATIONAL BRONCHOSCOPY   Ambulatory referral to Pulmonology   Bronchoscopy planned 04/20/2021  Return in about 25 days (around 04/27/2021) for with Eric Form, NP.    Please do your part to reduce the spread of COVID-19.

## 2021-04-02 NOTE — H&P (View-Only) (Signed)
Synopsis: Referred in February 2023 for lung nodule by Ochoa, Briana Brooke, NP  Subjective:   PATIENT ID: Briana Ochoa GENDER: female DOB: 13-Feb-1940, MRN: [id number]  Chief Complaint  Patient presents with   Consult    Patient is here to talk about lung nodules.     This is a 81 year old female, past medical history of bladder cancer, breast cancer, colon cancer.She had a stage IIa colon cancer and a ductal carcinoma in situ.  Patient is being followed by medical oncology.  She had recent restaging CT scan chest abdomen pelvis which revealed progressive enlargement of a left upper lobe nodule.On nuclear medicine pet imaging completed 03/17/2021 revealed a part solid lesion within the posterior left upper lobe with an SUV max of 2.5 due to the progression and change of the lesion there was concern for indolent neoplasm.  Patient was referred for discussion of robotic assisted navigational bronchoscopy and tissue sampling.   Oncology History Overview Note  Cancer Staging Cancer of left colon [facility]) Staging form: Colon and Rectum, AJCC 8th Edition - Pathologic stage from 05/28/2020: Stage IIA (pT3, pN0, cM0) - Signed by Truitt Merle, MD on 06/19/2020 Stage prefix: Initial diagnosis Total positive nodes: 0 Histologic grading system: 4 grade system Histologic grade (G): G3 Residual tumor (R): R0 - None  Ductal carcinoma in situ (DCIS) of left breast Staging form: Breast, AJCC 7th Edition - Clinical: Stage 0 (Tis (DCIS), N0, cM0) - Unsigned Specimen type: Core Needle Biopsy Histopathologic type: 9932 Laterality: Left Staging comments: Staged at breast conference 7.22.15  - Pathologic: Stage 0 (Tis (DCIS)(2), N0, cM0) - Unsigned Specimen type: Core Needle Biopsy Histopathologic type: 9932 Laterality: Left Tumor size (mm): 3 Multiple tumors: Yes Number of tumors: 2 Method of lymph node assessment: Clinical Method of detection of distant metastases: Clinical Residual tumor (R): R0  - None Estrogen receptor status: Positive Progesterone receptor status: Positive    Ductal carcinoma in situ (DCIS) of left breast  08/20/2013 Initial Diagnosis   Left breast 12:00: DCIS with calcifications, yet 100%, PR 100%; 09/04/2013 second group of calcifications also biopsied proven to be DCIS ER/PR positive   09/19/2013 Surgery   Left breast lumpectomy DCIS 2 foci margins negative, 0.2 and 0.3 cm ER 100% PR 100%   10/08/2013 - 11/12/2013 Radiation Therapy   Adjuvant radiation therapy   11/12/2013 - 2020 Anti-estrogen oral therapy   Anastrozole 1 mg daily plan is for 5 years   01/09/2014 - 01/10/2014 Hospital Admission   Pipeline embolization 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 The report date is 07/30/2020.   The CancerNext-Expanded + RNAinsight gene panel offered by [employer] and includes sequencing and rearrangement analysis for the following 77 genes: AIP, ALK, APC, ATM, AXIN2, BAP1, BARD1, BLM, BMPR1A, BRCA1, BRCA2, BRIP1, CDC73, CDH1, CDK4, CDKN1B, CDKN2A, CHEK2, CTNNA1, DICER1, FANCC, FH, FLCN, GALNT12, KIF1B, LZTR1, MAX, MEN1, MET, MLH1, MSH2, MSH3, MSH6, MUTYH, NBN, NF1, NF2, NTHL1, PALB2, PHOX2B, PMS2, POT1, PRKAR1A, PTCH1, PTEN, RAD51C, RAD51D, RB1, RECQL, RET, SDHA, SDHAF2, SDHB, SDHC, SDHD, SMAD4, SMARCA4, SMARCB1, SMARCE1, STK11, SUFU, TMEM127, TP53, TSC1, TSC2, VHL and XRCC2 (sequencing and deletion/duplication); EGFR, EGLN1, HOXB13, KIT, MITF, PDGFRA, POLD1 and POLE (sequencing only); EPCAM and GREM1 (deletion/duplication only). RNA data is routinely analyzed for use in variant interpretation for all genes.   Bladder cancer ([place])  02/09/2018 Initial Diagnosis   Bladder cancer ([place])  07/30/2020 Genetic Testing   Negative genetic testing:  No pathogenic variants detected on the [employer] CancerNext-Expanded + RNAinsight panel. The report date is  07/30/2020.   The CancerNext-Expanded + RNAinsight gene panel offered by [employer] and includes sequencing and rearrangement analysis for the following 77 genes: AIP, ALK, APC, ATM, AXIN2, BAP1, BARD1, BLM, BMPR1A, BRCA1, BRCA2, BRIP1, CDC73, CDH1, CDK4, CDKN1B, CDKN2A, CHEK2, CTNNA1, DICER1, FANCC, FH, FLCN, GALNT12, KIF1B, LZTR1, MAX, MEN1, MET, MLH1, MSH2, MSH3, MSH6, MUTYH, NBN, NF1, NF2, NTHL1, PALB2, PHOX2B, PMS2, POT1, PRKAR1A, PTCH1, PTEN, RAD51C, RAD51D, RB1, RECQL, RET, SDHA, SDHAF2, SDHB, SDHC, SDHD, SMAD4, SMARCA4, SMARCB1, SMARCE1, STK11, SUFU, TMEM127, TP53, TSC1, TSC2, VHL and XRCC2 (sequencing and deletion/duplication); EGFR, EGLN1, HOXB13, KIT, MITF, PDGFRA, POLD1 and POLE (sequencing only); EPCAM and GREM1 (deletion/duplication only). RNA data is routinely analyzed for use in variant interpretation for all genes.   08/31/2020 Imaging   CT CAP w/o contrast  IMPRESSION: 1. No specific findings of active malignancy on today's noncontrast CT. There has been interval partial colectomy involving the tumor site with reanastomosis. Some faint stranding in the adjacent omentum is probably from scarring or fat necrosis, less likely due to early tumor given the lack of overt nodularity. Surveillance imaging is likely indicated, and PET-CT may have a role in imaging follow up. 2. Several small pulmonary nodules, in addition to a larger mixed density nodule in the left upper lobe. Surveillance is recommended. 3. Other imaging findings of potential clinical significance: Aortic Atherosclerosis (ICD10-I70.0). Coronary atherosclerosis. Mitral valve calcification. Small type 1 hiatal hernia. Splenectomy with small amount of regenerative splenic tissue as well as a venous varix arising from the splenic vein. Hyperdense left kidney lower pole exophytic homogeneous lesion, probably a complex cyst. Sigmoid colon diverticulosis. Multilevel lumbar spondylosis and degenerative disc disease.    Cancer of left colon ([place])  04/23/2020 Procedure   Upper Endoscopy and Colonoscopy by Dr Carlean Purl IMPRESSION Erythematous mucosa in the antrum. Biopsied. - Small hiatal hernia. - Gastroesophageal flap valve classified as Hill Grade IV (no fold, wide open lumen, hiatal hernia present). - The examination was otherwise normal.   IMPRESSION - Decreased sphincter tone found on digital rectal exam. - Malignant partially obstructing tumor in the proximal descending colon. Biopsied. Tattooed. - Three 5 to 20 mm polyps in the sigmoid colon, removed piecemeal using a cold snare. Resected and retrieved. - Severe diverticulosis in the sigmoid colon. - I left several diminutive descending (proximal to tattoo), sigmoid and rectal polyps - The examination was otherwise normal on direct and retroflexion views.     04/23/2020 Initial Diagnosis   Diagnosis 1. Descending Colon Polyp, mass - ADENOCARCINOMA. 2. Sigmoid Colon Polyp, (3) - MULTIPLE FRAGMENTS OF TUBULAR ADENOMA. - MULTIPLE FRAGMENTS OF SESSILE SERRATED POLYP WITHOUT DYSPLASIA. - NO HIGH GRADE DYSPLASIA OR MALIGNANCY. Microscopic Comment 1. Dr. Vic Ripper has reviewed the case. Dr. Carlean Purl was notified on 04/24/2020. 2. Despite being left sided the fragments have features of a sessile serrated polyp, as opposed to, hyperplastic polyp.   04/27/2020 Imaging   CT AP IMPRESSION: Bulky annular soft tissue mass involving the distal transverse colon with extension into adjacent pericolonic fat, consistent with primary colon carcinoma.   No evidence of metastatic disease.   Colonic diverticulosis. No radiographic evidence of diverticulitis.   Aortic Atherosclerosis (ICD10-I70.0).     05/05/2020 Imaging   CT Chest IMPRESSION: Bulky annular soft tissue mass involving the distal transverse colon with extension into adjacent pericolonic fat, consistent with primary colon carcinoma.  No evidence of metastatic disease.   Colonic  diverticulosis. No radiographic evidence of diverticulitis.   Aortic Atherosclerosis (ICD10-I70.0).     05/28/2020 Initial Diagnosis   Cancer of left colon ([place])   05/28/2020 Cancer Staging   Staging form: Colon and Rectum, AJCC 8th Edition - Pathologic stage from 05/28/2020: Stage IIA (pT3, pN0, cM0) - Signed by Truitt Merle, MD on 06/19/2020 Stage prefix: Initial diagnosis Total positive nodes: 0 Histologic grading system: 4 grade system Histologic grade (G): G3 Residual tumor (R): R0 - None    05/28/2020 Surgery   XI ROBOT ASSISTED RESECTION OF SPLENIC FLEXURE by Dr Marcello Moores    FINAL MICROSCOPIC DIAGNOSIS:   A. COLON, SPLENIC FLEXURE, RESECTION:  - Invasive adenocarcinoma, poorly differentiated, spanning 9.7 cm.  - Tumor invades through muscularis propria into pericolonic tissue.  - Resection margins are negative.  - Tubular adenoma (X1).  - Sessile serrate polyp without dysplasia (x2).  - Diverticulosis.  - Polypectomy scar.  - Twenty-four of twenty-four lymph nodes negative for carcinoma (0/24).  - See oncology table.    07/07/2020 Miscellaneous   Guardant Reveal ctDNA positive MSI high   07/30/2020 Genetic Testing   Negative genetic testing:  No pathogenic variants detected on the [employer] CancerNext-Expanded + RNAinsight panel. The report date is 07/30/2020.   The CancerNext-Expanded + RNAinsight gene panel offered by [employer] and includes sequencing and rearrangement analysis for the following 77 genes: AIP, ALK, APC, ATM, AXIN2, BAP1, BARD1, BLM, BMPR1A, BRCA1, BRCA2, BRIP1, CDC73, CDH1, CDK4, CDKN1B, CDKN2A, CHEK2, CTNNA1, DICER1, FANCC, FH, FLCN, GALNT12, KIF1B, LZTR1, MAX, MEN1, MET, MLH1, MSH2, MSH3, MSH6, MUTYH, NBN, NF1, NF2, NTHL1, PALB2, PHOX2B, PMS2, POT1, PRKAR1A, PTCH1, PTEN, RAD51C, RAD51D, RB1, RECQL, RET, SDHA, SDHAF2, SDHB, SDHC, SDHD, SMAD4, SMARCA4, SMARCB1, SMARCE1, STK11, SUFU, TMEM127, TP53, TSC1, TSC2, VHL and XRCC2 (sequencing and deletion/duplication);  EGFR, EGLN1, HOXB13, KIT, MITF, PDGFRA, POLD1 and POLE (sequencing only); EPCAM and GREM1 (deletion/duplication only). RNA data is routinely analyzed for use in variant interpretation for all genes.   08/31/2020 Imaging   CT CAP w/o contrast  IMPRESSION: 1. No specific findings of active malignancy on today's noncontrast CT. There has been interval partial colectomy involving the tumor site with reanastomosis. Some faint stranding in the adjacent omentum is probably from scarring or fat necrosis, less likely due to early tumor given the lack of overt nodularity. Surveillance imaging is likely indicated, and PET-CT may have a role in imaging follow up. 2. Several small pulmonary nodules, in addition to a larger mixed density nodule in the left upper lobe. Surveillance is recommended. 3. Other imaging findings of potential clinical significance: Aortic Atherosclerosis (ICD10-I70.0). Coronary atherosclerosis. Mitral valve calcification. Small type 1 hiatal hernia. Splenectomy with small amount of regenerative splenic tissue as well as a venous varix arising from the splenic vein. Hyperdense left kidney lower pole exophytic homogeneous lesion, probably a complex cyst. Sigmoid colon diverticulosis. Multilevel lumbar spondylosis and degenerative disc disease.   10/02/2020 -  Chemotherapy   Patient is on Treatment Plan : COLORECTAL Pembrolizumab q21d     03/01/2021 Imaging   EXAM: CT CHEST, ABDOMEN AND PELVIS WITHOUT CONTRAST  IMPRESSION: 1. Mixed attenuation lesion posterior left upper lobe has areas of more confluent and enlarging nodularity today. Interval progression raises concern for neoplasm. Consider PET-CT to further evaluate. 2. Stable 7 mm right lower lobe pulmonary nodule with a slightly more conspicuous 3 mm nodule in the right lower lobe. Continued close attention on follow-up recommended. 3. No evidence for  metastatic disease in the abdomen or pelvis. 4. Aortic  Atherosclerosis (ICD10-I70.0).   03/17/2021 PET scan   IMPRESSION: 1. Part solid nodule of the left upper lobe demonstrates mild FDG uptake, and has increased in size when compared with prior exam dated May 05, 2020. Concerning for indolent primary lung neoplasm. 2. No evidence of metastatic disease in the chest, abdomen or pelvis. 3. 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 Recommend further evaluation with tissue sampling. 4. Aortic Atherosclerosis (ICD10-I70.0).     Past Medical History:  Diagnosis Date   Allergy    seasonal allergies   Anemia    on meds   Aortic atherosclerosis (HCC)    Bladder cancer ([place]) 2020   Bladder tumor    Bloody diarrhea 09/29/2017   Breast cancer ([place]) 2015   Left Breast Cancer   Cataract    bilateral -sx   CKD (chronic kidney disease), stage III ([place])    DM related   Colon cancer ([place])    Diabetes mellitus type 2, diet-controlled ([place])    diet controlled   Dyspnea 09/28/2017   Dysuria    Family history of adverse reaction to anesthesia    aunt- N/V    Family history of breast cancer    Family history of kidney cancer    Family history of throat cancer    Family history of uterine cancer    Fatigue 09/28/2017   Frequency of urination    Grade I diastolic dysfunction 91/69/4503   Noted on ECHO   Gross hematuria 09/29/2017   Heart murmur    since rheumatic fever as child   Hematuria    History of cardiomegaly 02/12/2005   Mild, noted on CXR   History of CVA (cerebrovascular accident) 08/05/2014   post op cerebral angiogram, cerebral thrombosis w/ cerebral infarction;  11-02-2017  per pt no residuals   History of malignant melanoma of skin 12/2015   s/p wide local excision right lower leg (per pt localized)   History of rheumatic fever as a child    Hyperlipidemia    on meds   Hypertension    on meds   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   Sigmoid diverticulosis    Weakness 09/28/2017   Wears dentures    upper   Wears glasses    Wears hearing aid in both ears      Family History  Problem Relation Age of Onset   Hypertension Mother    Kidney cancer Mother        dx early 67s   Heart attack Paternal Grandfather    Throat cancer Brother 80       hx smoking   Head & neck cancer Son 77       jaw cancer   Breast cancer Maternal Aunt 80   Breast cancer Paternal  Aunt 51   Uterine cancer Cousin        dx early 3s (maternal first cousin)   Colon polyps Neg Hx    Colon cancer Neg Hx    Esophageal cancer Neg Hx    Rectal cancer Neg Hx    Stomach cancer Neg Hx      Past Surgical History:  Procedure Laterality Date   BREAST LUMPECTOMY Left 2015   BREAST LUMPECTOMY WITH NEEDLE LOCALIZATION Left 09/19/2013   Procedure: LEFT BREAST LUMPECTOMY WITH DOUBLE WIRE BRACKETED  NEEDLE LOCALIZATION;  Surgeon: [name], MD;  Location: [place];  Service: General;  Laterality: Left;   CATARACT EXTRACTION W/ INTRAOCULAR LENS  IMPLANT, BILATERAL  06/2015   CESAREAN SECTION  1964   CHOLECYSTECTOMY OPEN  1970s   CYSTOSCOPY W/ URETERAL STENT PLACEMENT Left 11/07/2017   Procedure: CYSTOSCOPY WITH RETROGRADE PYELOGRAM/URETERAL STENT PLACEMENT;  Surgeon: Cleon Gustin, MD;  Location: [facility];  Service: Urology;  Laterality: Left;   CYSTOSCOPY W/ URETERAL STENT PLACEMENT  Bilateral 04/27/2018   Procedure: CYSTOSCOPY WITH RETROGRADE PYELOGRAM;  Surgeon: Cleon Gustin, MD;  Location: [facility];  Service: Urology;  Laterality: Bilateral;   FINGER SURGERY     lt thumb  from dog bite   IR  NEPHROURETERAL CATH PLACE LEFT  04/28/2018   MELANOMA EXCISION Right 12/28/2015   Procedure: EXCISION MELANOMA RIGHT LOWER LEG;  Surgeon: Fanny Skates, MD;  Location: [place];  Service: General;  Laterality: Right;   RADIOLOGY WITH ANESTHESIA N/A 01/09/2014   Procedure: Embolization;  Surgeon: Consuella Lose, MD;  Location: [place];  Service: Radiology;  Laterality: N/A;   SPLENECTOMY, TOTAL  2009 approx.   splen injury due to fall off horse   TONSILLECTOMY  child   TRANSURETHRAL RESECTION OF BLADDER TUMOR N/A 11/07/2017   Procedure: TRANSURETHRAL RESECTION OF BLADDER TUMOR (TURBT), POSSIBLE STENT PLACEMENT;  Surgeon: Cleon Gustin, MD;  Location: [facility];  Service: Urology;  Laterality: N/A;   TRANSURETHRAL RESECTION OF BLADDER TUMOR N/A 12/14/2017   Procedure: TRANSURETHRAL RESECTION OF BLADDER TUMOR (TURBT);  Surgeon: Cleon Gustin, MD;  Location: [facility];  Service: Urology;  Laterality: N/A;  [place] TUMOR N/A 04/27/2018   Procedure: TRANSURETHRAL RESECTION OF BLADDER TUMOR (TURBT);  Surgeon: Cleon Gustin, MD;  Location: [facility];  Service: Urology;  Laterality: N/A;  30 MINS    Social History   Socioeconomic History   Marital status: Widowed    Spouse name: Not on file   Number of children: 4   Years of education: Not on file   Highest education level: Not on file  Occupational History    Comment: Retired  Tobacco Use   Smoking status: Former    Packs/day: 1.50    Years: 40.00    Pack years: 60.00    Types: Cigarettes    Quit date: 03/06/2007    Years since quitting: 14.0   Smokeless tobacco: Never  Vaping Use    Vaping Use: Never used  Substance and Sexual Activity   Alcohol use: No   Drug use: Never   Sexual activity: Not on file  Other Topics Concern   Not on file  Social History Narrative   Widowed 2013 originally from [place] moved to [place] decades ago to work for [employer]   5 children   17 grandchildren      Does  not have a living will-desires CPR, does not want prolonged life support if futile.   Social Determinants of Health   Financial Resource Strain: Not on file  Food Insecurity: Not on file  Transportation Needs: Not on file  Physical Activity: Not on file  Stress: Not on file  Social Connections: Not on file  Intimate Partner Violence: Not on file     Allergies  Allergen Reactions   Feraheme [Ferumoxytol] Shortness Of Breath and Palpitations    Flushed face; palpitations, SOB     Outpatient Medications Prior to Visit  Medication Sig Dispense Refill   acetaminophen (TYLENOL) 500 MG tablet Take 1,000 mg by mouth daily.     capecitabine (XELODA) 500 MG tablet Take 2 tablets (1000 mg total) by mouth in morning and 3 tablets (1500 mg total) by mouth in evening. Take within 30 minutes after meals. Take for 14 days on and 7 days off. Repeat every 21 days. 70 tablet 1   glucose blood (TRUE METRIX BLOOD GLUCOSE TEST) test strip Use as instructed to test blood sugar once daily E08.638 100 each 2   lisinopril (ZESTRIL) 20 MG tablet TAKE 1 TABLET EVERY DAY 90 tablet 3   ondansetron (ZOFRAN) 8 MG tablet Take 1 tablet (8 mg total) by mouth every 8 (eight) hours as needed for nausea or vomiting. 20 tablet 0   pravastatin (PRAVACHOL) 10 MG tablet TAKE 1 TABLET EVERY DAY (STOP PRAVASTATIN 20MG) 90 tablet 3   predniSONE (STERAPRED UNI-PAK 21 TAB) 10 MG (21) TBPK tablet Take 4 tabs daily for 2 days then drip one tab every 2 days until finish. 21 tablet 0   No facility-administered medications prior to visit.    Review of Systems  Constitutional:  Negative for chills,  fever, malaise/fatigue and weight loss.  HENT:  Negative for hearing loss, sore throat and tinnitus.   Eyes:  Negative for blurred vision and double vision.  Respiratory:  Negative for cough, hemoptysis, sputum production, shortness of breath, wheezing and stridor.   Cardiovascular:  Negative for chest pain, palpitations, orthopnea, leg swelling and PND.  Gastrointestinal:  Negative for abdominal pain, constipation, diarrhea, heartburn, nausea and vomiting.  Genitourinary:  Negative for dysuria, hematuria and urgency.  Musculoskeletal:  Negative for joint pain and myalgias.  Skin:  Negative for itching and rash.  Neurological:  Negative for dizziness, tingling, weakness and headaches.  Endo/Heme/Allergies:  Negative for environmental allergies. Does not bruise/bleed easily.  Psychiatric/Behavioral:  Negative for depression. The patient is not nervous/anxious and does not have insomnia.   All other systems reviewed and are negative.   Objective:  Physical Exam Vitals reviewed.  Constitutional:      General: She is not in acute distress.    Appearance: She is well-developed.  HENT:     Head: Normocephalic and atraumatic.  Eyes:     General: No scleral icterus.    Conjunctiva/sclera: Conjunctivae normal.     Pupils: Pupils are equal, round, and reactive to light.  Neck:     Vascular: No JVD.     Trachea: No tracheal deviation.  Cardiovascular:     Rate and Rhythm: Normal rate and regular rhythm.     Heart sounds: Normal heart sounds. No murmur heard. Pulmonary:     Effort: Pulmonary effort is normal. No tachypnea, accessory muscle usage or respiratory distress.     Breath sounds: No stridor. No wheezing, rhonchi or rales.  Abdominal:     General: There is no distension.  Palpations: Abdomen is soft.     Tenderness: There is no abdominal tenderness.  Musculoskeletal:        General: No tenderness.     Cervical back: Neck supple.  Lymphadenopathy:     Cervical: No cervical  adenopathy.  Skin:    General: Skin is warm and dry.     Capillary Refill: Capillary refill takes less than 2 seconds.     Findings: No rash.  Neurological:     Mental Status: She is alert and oriented to person, place, and time.  Psychiatric:        Behavior: Behavior normal.     Vitals:   04/02/21 1355  BP: 122/70  Pulse: 74  Temp: 97.8 F (36.6 C)  TempSrc: Oral  SpO2: 99%  Weight: 173 lb (78.5 kg)  Height: _0  (1.626 m)   99% on RA BMI Readings from Last 3 Encounters:  04/02/21 29.70 kg/m  03/26/21 29.77 kg/m  03/03/21 29.88 kg/m   Wt Readings from Last 3 Encounters:  04/02/21 173 lb (78.5 kg)  03/26/21 173 lb 7 oz (78.7 kg)  03/03/21 174 lb 1.6 oz (79 kg)     CBC    Component Value Date/Time   WBC 8.9 03/26/2021 0921   WBC 13.5 (H) 06/01/2020 0433   RBC 4.22 03/26/2021 0921   HGB 14.3 03/26/2021 0921   HCT 41.0 03/26/2021 0921   PLT 264 03/26/2021 0921   MCV 97.2 03/26/2021 0921   MCV 96.0 03/10/2013 1312   MCH 33.9 03/26/2021 0921   MCHC 34.9 03/26/2021 0921   RDW 18.6 (H) 03/26/2021 0921   LYMPHSABS 1.1 03/26/2021 0921   MONOABS 1.2 (H) 03/26/2021 0921   EOSABS 0.4 03/26/2021 0921   BASOSABS 0.1 03/26/2021 0921     Chest Imaging: 03/17/2021 nuclear medicine pet imaging: Hypermetabolic lesion within the left upper lobe concerning for indolent neoplasm. The patient's images have been independently reviewed by me.    Pulmonary Functions Testing Results: No flowsheet data found.  FeNO:   Pathology:   Echocardiogram:   Heart Catheterization:     Assessment & Plan:     ICD-10-CM   1. Lung nodule  R91.1 Ambulatory referral to Pulmonology    Procedural/ Surgical Case Request: ROBOTIC ASSISTED NAVIGATIONAL BRONCHOSCOPY    2. Family history of kidney cancer  Z80.51     3. Family history of breast cancer  Z80.3     4. Family history of uterine cancer  Z80.49     5. Family history of throat cancer  Z80.0     6. History of breast  cancer  Z85.3     7. History of bladder cancer  Z85.51     8. History of colon cancer  Z85.038     9. History of tobacco use  Z87.891       Discussion:  This is an 81 year old female with a significant malignancy history.  Patient has had multiple malignancies and undergone several various treatments with oncology.  Now has a left upper lobe subsolid peripheral nodule concerning for indolent neoplasm.  We discussed her CT radiographic changes as well as a nuclear medicine PET scan that was recently completed  Plan: Today in the office we discussed the risk benefits and alternatives of proceeding with robotic assisted navigational bronchoscopy and tissue sampling.  Patient is agreeable to proceed with this.  We talked about the risk of bleeding and pneumothorax.  We also talked about the risk of a potential nondiagnostic study. Plan  will be to tentatively schedule bronchoscopy on 04/20/2021. Patient is agreeable to proceed. Orders have been placed.  I will have patient follow-up with [place] in clinic a week later to discuss pathology results.    Current Outpatient Medications:    acetaminophen (TYLENOL) 500 MG tablet, Take 1,000 mg by mouth daily., Disp: , Rfl:    capecitabine (XELODA) 500 MG tablet, Take 2 tablets (1000 mg total) by mouth in morning and 3 tablets (1500 mg total) by mouth in evening. Take within 30 minutes after meals. Take for 14 days on and 7 days off. Repeat every 21 days., Disp: 70 tablet, Rfl: 1   glucose blood (TRUE METRIX BLOOD GLUCOSE TEST) test strip, Use as instructed to test blood sugar once daily E08.638, Disp: 100 each, Rfl: 2   lisinopril (ZESTRIL) 20 MG tablet, TAKE 1 TABLET EVERY DAY, Disp: 90 tablet, Rfl: 3   ondansetron (ZOFRAN) 8 MG tablet, Take 1 tablet (8 mg total) by mouth every 8 (eight) hours as needed for nausea or vomiting., Disp: 20 tablet, Rfl: 0   pravastatin (PRAVACHOL) 10 MG tablet, TAKE 1 TABLET EVERY DAY (STOP PRAVASTATIN 20MG), Disp: 90  tablet, Rfl: 3   predniSONE (STERAPRED UNI-PAK 21 TAB) 10 MG (21) TBPK tablet, Take 4 tabs daily for 2 days then drip one tab every 2 days until finish., Disp: 21 tablet, Rfl: 0  I spent 63 minutes dedicated to the care of this patient on the date of this encounter to include pre-visit review of records, face-to-face time with the patient discussing conditions above, post visit ordering of testing, clinical documentation with the electronic health record, making appropriate referrals as documented, and communicating necessary findings to members of the patients care team.   Garner Nash, DO [facility] 04/02/2021 3:17 PM

## 2021-04-05 NOTE — Progress Notes (Signed)
° ° °-------------    SDW INSTRUCTIONS given:  Your procedure is scheduled on 04/06/21.  Report to [facility] Main Entrance "A" at 1145 A.M., and check in at the Admitting office.  Call this number if you have problems the morning of surgery:  [phone]   Remember:  Do not eat after midnight the night before your surgery    Take these medicines the morning of surgery with A SIP OF WATER  acetaminophen (TYLENOL)  pravastatin (PRAVACHOL)   As of today, STOP taking any Aspirin (unless otherwise instructed by your surgeon) Aleve, Naproxen, Ibuprofen, Motrin, Advil, Goody's, BC's, all herbal medications, fish oil, and all vitamins.                      Do not wear jewelry, make up, or nail polish            Do not wear lotions, powders, perfumes/colognes, or deodorant.            Do not shave 48 hours prior to surgery.  Men may shave face and neck.            Do not bring valuables to the hospital.            [facility] is not responsible for any belongings or valuables.  Do NOT Smoke (Tobacco/Vaping) 24 hours prior to your procedure If you use a CPAP at night, you may bring all equipment for your overnight stay.   Contacts, glasses, dentures or bridgework may not be worn into surgery.      For patients admitted to the hospital, discharge time will be determined by your treatment team.   Patients discharged the day of surgery will not be allowed to drive home, and someone needs to stay with them for 24 hours.    Special instructions:   [facility]- Preparing For Surgery  Before surgery, you can play an important role. Because skin is not sterile, your skin needs to be as free of germs as possible. You can reduce the number of germs on your skin by washing with CHG (chlorahexidine gluconate) Soap before surgery.  CHG is an antiseptic cleaner which kills germs and bonds with the skin to continue killing germs even after washing.    Oral Hygiene is also important to reduce your  risk of infection.  Remember - BRUSH YOUR TEETH THE MORNING OF SURGERY WITH YOUR REGULAR TOOTHPASTE  Please do not use if you have an allergy to CHG or antibacterial soaps. If your skin becomes reddened/irritated stop using the CHG.  Do not shave (including legs and underarms) for at least 48 hours prior to first CHG shower. It is OK to shave your face.  Please follow these instructions carefully.   Shower the NIGHT BEFORE SURGERY and the MORNING OF SURGERY with DIAL Soap.   Pat yourself dry with a CLEAN TOWEL.  Wear CLEAN PAJAMAS to bed the night before surgery  Place CLEAN SHEETS on your bed the night of your first shower and DO NOT SLEEP WITH PETS.   Day of Surgery: Please shower morning of surgery  Wear Clean/Comfortable clothing the morning of surgery Do not apply any deodorants/lotions.   Remember to brush your teeth WITH YOUR REGULAR TOOTHPASTE.   Questions were answered. Patient verbalized understanding of instructions.

## 2021-04-06 ENCOUNTER — Ambulatory Visit (HOSPITAL_COMMUNITY): Payer: Medicare PPO

## 2021-04-06 ENCOUNTER — Encounter (HOSPITAL_COMMUNITY): Payer: Self-pay | Admitting: Pulmonary Disease

## 2021-04-06 ENCOUNTER — Ambulatory Visit (HOSPITAL_COMMUNITY)
Admission: RE | Admit: 2021-04-06 | Discharge: 2021-04-06 | Disposition: A | Discharge status: Home / Self Care | Payer: Medicare PPO | Source: Other Acute Inpatient Hospital | Attending: Pulmonary Disease | Admitting: Pulmonary Disease

## 2021-04-06 ENCOUNTER — Other Ambulatory Visit: Payer: Self-pay

## 2021-04-06 ENCOUNTER — Ambulatory Visit (HOSPITAL_COMMUNITY): Payer: Medicare PPO | Admitting: Anesthesiology

## 2021-04-06 ENCOUNTER — Ambulatory Visit (HOSPITAL_BASED_OUTPATIENT_CLINIC_OR_DEPARTMENT_OTHER): Payer: Medicare PPO | Admitting: Anesthesiology

## 2021-04-06 ENCOUNTER — Encounter (HOSPITAL_COMMUNITY): Admission: RE | Disposition: A | Discharge status: Home / Self Care | Payer: Self-pay | Attending: Pulmonary Disease

## 2021-04-06 DIAGNOSIS — R911 Solitary pulmonary nodule: Secondary | ICD-10-CM

## 2021-04-06 DIAGNOSIS — M1990 Unspecified osteoarthritis, unspecified site: Secondary | ICD-10-CM | POA: Diagnosis not present

## 2021-04-06 DIAGNOSIS — Z853 Personal history of malignant neoplasm of breast: Secondary | ICD-10-CM | POA: Insufficient documentation

## 2021-04-06 DIAGNOSIS — Z7984 Long term (current) use of oral hypoglycemic drugs: Secondary | ICD-10-CM

## 2021-04-06 DIAGNOSIS — Z20822 Contact with and (suspected) exposure to covid-19: Secondary | ICD-10-CM | POA: Diagnosis not present

## 2021-04-06 DIAGNOSIS — Z419 Encounter for procedure for purposes other than remedying health state, unspecified: Secondary | ICD-10-CM

## 2021-04-06 DIAGNOSIS — Z87891 Personal history of nicotine dependence: Secondary | ICD-10-CM | POA: Insufficient documentation

## 2021-04-06 DIAGNOSIS — I10 Essential (primary) hypertension: Secondary | ICD-10-CM | POA: Insufficient documentation

## 2021-04-06 DIAGNOSIS — Z79899 Other long term (current) drug therapy: Secondary | ICD-10-CM | POA: Insufficient documentation

## 2021-04-06 DIAGNOSIS — Z85038 Personal history of other malignant neoplasm of large intestine: Secondary | ICD-10-CM | POA: Insufficient documentation

## 2021-04-06 DIAGNOSIS — R918 Other nonspecific abnormal finding of lung field: Secondary | ICD-10-CM | POA: Diagnosis present

## 2021-04-06 DIAGNOSIS — E119 Type 2 diabetes mellitus without complications: Secondary | ICD-10-CM | POA: Insufficient documentation

## 2021-04-06 DIAGNOSIS — Z9889 Other specified postprocedural states: Secondary | ICD-10-CM

## 2021-04-06 DIAGNOSIS — E1151 Type 2 diabetes mellitus with diabetic peripheral angiopathy without gangrene: Secondary | ICD-10-CM

## 2021-04-06 DIAGNOSIS — I82409 Acute embolism and thrombosis of unspecified deep veins of unspecified lower extremity: Secondary | ICD-10-CM

## 2021-04-06 DIAGNOSIS — I700 Atherosclerosis of aorta: Secondary | ICD-10-CM | POA: Diagnosis not present

## 2021-04-06 HISTORY — PX: VIDEO BRONCHOSCOPY WITH RADIAL ENDOBRONCHIAL ULTRASOUND: SHX6849

## 2021-04-06 HISTORY — PX: BRONCHIAL BRUSHINGS: SHX5108

## 2021-04-06 HISTORY — PX: BRONCHIAL BIOPSY: SHX5109

## 2021-04-06 LAB — GLUCOSE, CAPILLARY: Glucose-Capillary: 103 mg/dL — ABNORMAL HIGH (ref 70–99)

## 2021-04-06 LAB — SARS CORONAVIRUS 2 BY RT PCR (HOSPITAL ORDER, PERFORMED IN ~~LOC~~ HOSPITAL LAB): SARS Coronavirus 2: NEGATIVE

## 2021-04-06 SURGERY — BRONCHOSCOPY, WITH BIOPSY USING ELECTROMAGNETIC NAVIGATION
Anesthesia: General | Laterality: Left

## 2021-04-06 MED ORDER — LACTATED RINGERS IV SOLN: INTRAVENOUS | Status: DC

## 2021-04-06 MED ORDER — FENTANYL CITRATE (PF) 100 MCG/2ML IJ SOLN: 25.0000 ug | INTRAMUSCULAR | Status: DC | PRN

## 2021-04-06 MED ORDER — FENTANYL CITRATE (PF) 100 MCG/2ML IJ SOLN
INTRAMUSCULAR | Status: DC | PRN
  Administered 2021-04-06: 100 ug via INTRAVENOUS

## 2021-04-06 MED ORDER — ROCURONIUM BROMIDE 10 MG/ML (PF) SYRINGE
PREFILLED_SYRINGE | INTRAVENOUS | Status: DC | PRN
  Administered 2021-04-06: 60 mg via INTRAVENOUS

## 2021-04-06 MED ORDER — PHENYLEPHRINE HCL-NACL 20-0.9 MG/250ML-% IV SOLN
INTRAVENOUS | Status: DC | PRN
Start: 2021-04-06 — End: 2021-04-06
  Administered 2021-04-06: 40 ug/min via INTRAVENOUS

## 2021-04-06 MED ORDER — ONDANSETRON HCL 4 MG/2ML IJ SOLN: 4.0000 mg | Freq: Once | INTRAMUSCULAR | Status: DC | PRN

## 2021-04-06 MED ORDER — DEXAMETHASONE SODIUM PHOSPHATE 10 MG/ML IJ SOLN
INTRAMUSCULAR | Status: DC | PRN
  Administered 2021-04-06: 10 mg via INTRAVENOUS

## 2021-04-06 MED ORDER — LIDOCAINE 2% (20 MG/ML) 5 ML SYRINGE
INTRAMUSCULAR | Status: DC | PRN
  Administered 2021-04-06: 80 mg via INTRAVENOUS

## 2021-04-06 MED ORDER — PHENYLEPHRINE 40 MCG/ML (10ML) SYRINGE FOR IV PUSH (FOR BLOOD PRESSURE SUPPORT)
PREFILLED_SYRINGE | INTRAVENOUS | Status: DC | PRN
  Administered 2021-04-06: 160 ug via INTRAVENOUS
  Administered 2021-04-06: 120 ug via INTRAVENOUS
  Administered 2021-04-06: 80 ug via INTRAVENOUS

## 2021-04-06 MED ORDER — PROPOFOL 10 MG/ML IV BOLUS
INTRAVENOUS | Status: DC | PRN
  Administered 2021-04-06: 110 mg via INTRAVENOUS

## 2021-04-06 MED ORDER — ONDANSETRON HCL 4 MG/2ML IJ SOLN
INTRAMUSCULAR | Status: DC | PRN
  Administered 2021-04-06: 4 mg via INTRAVENOUS

## 2021-04-06 MED ORDER — CHLORHEXIDINE GLUCONATE 0.12 % MT SOLN
OROMUCOSAL | Status: AC
  Administered 2021-04-06: 15 mL
  Filled 2021-04-06: qty 15

## 2021-04-06 SURGICAL SUPPLY — 1 items: superlock fiducial marker ×1 IMPLANT

## 2021-04-06 NOTE — Op Note (Signed)
Video Bronchoscopy with Robotic Assisted Bronchoscopic Navigation   Date of Operation: 04/06/2021   Pre-op Diagnosis: lung nodule, LUL  Post-op Diagnosis: lung nodule, LUL  Surgeon: Garner Nash, DO   Assistants: None   Anesthesia: General endotracheal anesthesia  Operation: Flexible video fiberoptic bronchoscopy with robotic assistance and biopsies.  Estimated Blood Loss: Minimal  Complications: None  Indications and History: Briana Ochoa is a 80 y.o. female with history of LUL pulmonary nodule. The risks, benefits, complications, treatment options and expected outcomes were discussed with the patient.  The possibilities of pneumothorax, pneumonia, reaction to medication, pulmonary aspiration, perforation of a viscus, bleeding, failure to diagnose a condition and creating a complication requiring transfusion or operation were discussed with the patient who freely signed the consent.    Description of Procedure: The patient was seen in the Preoperative Area, was examined and was deemed appropriate to proceed.  The patient was taken to [facility] endoscopy room 3, identified as Briana Ochoa and the procedure verified as Flexible Video Fiberoptic Bronchoscopy.  A Time Out was held and the above information confirmed.   Prior to the date of the procedure a high-resolution CT scan of the chest was performed. Utilizing ION software program a virtual tracheobronchial tree was generated to allow the creation of distinct navigation pathways to the patient's parenchymal abnormalities. After being taken to the operating room general anesthesia was initiated and the patient  was orally intubated. The video fiberoptic bronchoscope was introduced via the endotracheal tube and a general inspection was performed which showed normal right and left lung anatomy, aspiration of the bilateral mainstems was completed to remove any remaining secretions. Robotic catheter inserted into patient's endotracheal  tube.   Target #1 left upper lobe pulmonary nodule: The distinct navigation pathways prepared prior to this procedure were then utilized to navigate to patient's lesion identified on CT scan. The robotic catheter was secured into place and the vision probe was withdrawn.  Lesion location was approximated using fluoroscopy, three-dimensional cone beam CT imaging, and radial endobronchial ultrasound for peripheral targeting. Under fluoroscopic guidance transbronchial needle brushings, transbronchial needle biopsies, and transbronchial forceps biopsies were performed to be sent for cytology and pathology.  Following tissue sampling a single fiducial was placed under fluoroscopic guidance with the fiducial catheter wire and delivery kit.  A bronchioalveolar lavage was performed in the left upper lobe and sent for microbiology.  At the end of the procedure a general airway inspection was performed and there was no evidence of active bleeding. The bronchoscope was removed.  The patient tolerated the procedure well. There was no significant blood loss and there were no obvious complications. A post-procedural chest x-ray is pending.  Samples Target #1: 1. Transbronchial needle brushings from left upper lobe 2. Transbronchial Wang needle biopsies from left upper lobe 3. Transbronchial forceps biopsies from left upper lobe 4. Bronchoalveolar lavage from left upper lobe  Plans:  The patient will be discharged from the PACU to home when recovered from anesthesia and after chest x-ray is reviewed. We will review the cytology, pathology and microbiology results with the patient when they become available. Outpatient followup will be with Garner Nash, DO.    Garner Nash, DO [facility] 04/06/2021 3:48 PM

## 2021-04-06 NOTE — Transfer of Care (Signed)
Immediate Anesthesia Transfer of Care Note  Patient: Briana Ochoa  Procedure(s) Performed: ROBOTIC ASSISTED NAVIGATIONAL BRONCHOSCOPY (Left) BRONCHIAL BIOPSIES BRONCHIAL BRUSHINGS RADIAL ENDOBRONCHIAL ULTRASOUND  Patient Location: PACU and Endoscopy Unit  Anesthesia Type:General  Level of Consciousness: drowsy, patient cooperative and responds to stimulation  Airway & Oxygen Therapy: Patient Spontanous Breathing  Post-op Assessment: Report given to RN and Post -op Vital signs reviewed and stable  Post vital signs: Reviewed and stable  Last Vitals:  Vitals Value Taken Time  BP    Temp    Pulse    Resp    SpO2      Last Pain:  Vitals:   04/06/21 1217  TempSrc:   PainSc: 0-No pain         Complications: No notable events documented.

## 2021-04-06 NOTE — Anesthesia Procedure Notes (Signed)
Procedure Name: Intubation Date/Time: 04/06/2021 2:13 PM Performed by: Janace Litten, CRNA Pre-anesthesia Checklist: Patient identified, Emergency Drugs available, Suction available and Patient being monitored Patient Re-evaluated:Patient Re-evaluated prior to induction Oxygen Delivery Method: Circle System Utilized Preoxygenation: Pre-oxygenation with 100% oxygen Induction Type: IV induction Ventilation: Mask ventilation without difficulty Laryngoscope Size: Mac and 3 Grade View: Grade I Tube type: Oral Tube size: 8.5 mm Number of attempts: 1 Airway Equipment and Method: Stylet and Oral airway Placement Confirmation: ETT inserted through vocal cords under direct vision, positive ETCO2 and breath sounds checked- equal and bilateral Secured at: 21 cm Tube secured with: Tape Dental Injury: Teeth and Oropharynx as per pre-operative assessment

## 2021-04-06 NOTE — Interval H&P Note (Signed)
History and Physical Interval Note:  04/06/2021 12:16 PM  Briana Ochoa  has presented today for surgery, with the diagnosis of lung nodle.  The various methods of treatment have been discussed with the patient and family. After consideration of risks, benefits and other options for treatment, the patient has consented to  Procedure(s) with comments: ROBOTIC ASSISTED NAVIGATIONAL BRONCHOSCOPY (Left) - ION w/ CIOS as a surgical intervention.  The patient's history has been reviewed, patient examined, no change in status, stable for surgery.  I have reviewed the patient's chart and labs.  Questions were answered to the patient's satisfaction.     [facility]

## 2021-04-06 NOTE — Anesthesia Preprocedure Evaluation (Addendum)
Anesthesia Evaluation  Patient identified by MRN, date of birth, ID band Patient awake    Reviewed: Allergy & Precautions, NPO status , Patient's Chart, lab work & pertinent test results  History of Anesthesia Complications (+) Family history of anesthesia reaction  Airway Mallampati: II  TM Distance: >3 FB Neck ROM: Full    Dental  (+) Upper Dentures, Dental Advisory Given   Pulmonary shortness of breath and with exertion, former smoker,  Pulmonary nodule   Pulmonary exam normal breath sounds clear to auscultation       Cardiovascular hypertension, Pt. on medications + Peripheral Vascular Disease, + DOE and + DVT  Normal cardiovascular exam+ Valvular Problems/Murmurs  Rhythm:Regular Rate:Normal     Neuro/Psych negative neurological ROS  negative psych ROS   GI/Hepatic negative GI ROS, Neg liver ROS, Hx/o colon Ca   Endo/Other  diabetes, Well Controlled, Type 2, Oral Hypoglycemic AgentsHyperlipidemia Hx/o Breast Ca  Renal/GU Renal disease   Hx/o bladder Ca negative genitourinary   Musculoskeletal  (+) Arthritis , Osteoarthritis,  Hx/o melanoma   Abdominal   Peds  Hematology  (+) Blood dyscrasia, anemia ,   Anesthesia Other Findings   Reproductive/Obstetrics                            Anesthesia Physical Anesthesia Plan  ASA: 3  Anesthesia Plan: General   Post-op Pain Management: Minimal or no pain anticipated   Induction: Intravenous  PONV Risk Score and Plan: 3 and Treatment may vary due to age or medical condition and Ondansetron  Airway Management Planned: Oral ETT  Additional Equipment: None  Intra-op Plan:   Post-operative Plan: Extubation in OR  Informed Consent: I have reviewed the patients History and Physical, chart, labs and discussed the procedure including the risks, benefits and alternatives for the proposed anesthesia with the patient or authorized  representative who has indicated his/her understanding and acceptance.     Dental advisory given  Plan Discussed with: CRNA and Anesthesiologist  Anesthesia Plan Comments:         Anesthesia Quick Evaluation

## 2021-04-06 NOTE — Anesthesia Postprocedure Evaluation (Signed)
Anesthesia Post Note  Patient: JAMISON YUHASZ  Procedure(s) Performed: ROBOTIC ASSISTED NAVIGATIONAL BRONCHOSCOPY (Left) BRONCHIAL BIOPSIES BRONCHIAL BRUSHINGS RADIAL ENDOBRONCHIAL ULTRASOUND     Patient location during evaluation: PACU Anesthesia Type: General Level of consciousness: awake and alert and oriented Pain management: pain level controlled Vital Signs Assessment: post-procedure vital signs reviewed and stable Respiratory status: spontaneous breathing, nonlabored ventilation and respiratory function stable Cardiovascular status: blood pressure returned to baseline and stable Postop Assessment: no apparent nausea or vomiting Anesthetic complications: no   No notable events documented.  Last Vitals:  Vitals:   04/06/21 1527 04/06/21 1540  BP: (!) 121/38 (!) 108/35  Pulse: 73 72  Resp: 13 13  Temp:    SpO2: 99% 95%    Last Pain:  Vitals:   04/06/21 1540  TempSrc:   PainSc: 0-No pain                 Tawanda Schall A.

## 2021-04-06 NOTE — Discharge Instructions (Signed)
Flexible Bronchoscopy, Care After This sheet gives you information about how to care for yourself after your test. Your doctor may also give you more specific instructions. If you have problems or questions, contact your doctor. Follow these instructions at home: Eating and drinking Do not eat or drink anything (not even water) for 2 hours after your test, or until your numbing medicine (local anesthetic) wears off. When your numbness is gone and your cough and gag reflexes have come back, you may: Eat only soft foods. Slowly drink liquids. The day after the test, go back to your normal diet. Driving Do not drive for 24 hours if you were given a medicine to help you relax (sedative). Do not drive or use heavy machinery while taking prescription pain medicine. General instructions  Take over-the-counter and prescription medicines only as told by your doctor. Return to your normal activities as told. Ask what activities are safe for you. Do not use any products that have nicotine or tobacco in them. This includes cigarettes and e-cigarettes. If you need help quitting, ask your doctor. Keep all follow-up visits as told by your doctor. This is important. It is very important if you had a tissue sample (biopsy) taken. Get help right away if: You have shortness of breath that gets worse. You get light-headed. You feel like you are going to pass out (faint). You have chest pain. You cough up: More than a little blood. More blood than before. Summary Do not eat or drink anything (not even water) for 2 hours after your test, or until your numbing medicine wears off. Do not use cigarettes. Do not use e-cigarettes. Get help right away if you have chest pain.  This information is not intended to replace advice given to you by your health care provider. Make sure you discuss any questions you have with your health care provider. Document Released: 11/21/2008 Document Revised: 01/06/2017 Document  Reviewed: 02/12/2016 Elsevier Patient Education  2020 [employer].

## 2021-04-07 ENCOUNTER — Other Ambulatory Visit (HOSPITAL_COMMUNITY): Payer: Self-pay

## 2021-04-07 ENCOUNTER — Encounter (HOSPITAL_COMMUNITY): Payer: Self-pay | Admitting: Pulmonary Disease

## 2021-04-07 LAB — CYTOLOGY - NON PAP

## 2021-04-08 LAB — ACID FAST SMEAR (AFB, MYCOBACTERIA)
Acid Fast Smear: NEGATIVE
Acid Fast Smear: NEGATIVE

## 2021-04-09 ENCOUNTER — Inpatient Hospital Stay: Payer: Medicare PPO | Attending: Hematology

## 2021-04-09 ENCOUNTER — Other Ambulatory Visit: Payer: Self-pay

## 2021-04-09 DIAGNOSIS — Z5112 Encounter for antineoplastic immunotherapy: Secondary | ICD-10-CM | POA: Insufficient documentation

## 2021-04-09 DIAGNOSIS — Z8551 Personal history of malignant neoplasm of bladder: Secondary | ICD-10-CM | POA: Diagnosis not present

## 2021-04-09 DIAGNOSIS — I129 Hypertensive chronic kidney disease with stage 1 through stage 4 chronic kidney disease, or unspecified chronic kidney disease: Secondary | ICD-10-CM | POA: Insufficient documentation

## 2021-04-09 DIAGNOSIS — Z853 Personal history of malignant neoplasm of breast: Secondary | ICD-10-CM | POA: Diagnosis not present

## 2021-04-09 DIAGNOSIS — C186 Malignant neoplasm of descending colon: Secondary | ICD-10-CM | POA: Diagnosis not present

## 2021-04-09 DIAGNOSIS — Z85820 Personal history of malignant melanoma of skin: Secondary | ICD-10-CM | POA: Diagnosis not present

## 2021-04-09 DIAGNOSIS — E538 Deficiency of other specified B group vitamins: Secondary | ICD-10-CM | POA: Diagnosis not present

## 2021-04-09 DIAGNOSIS — N183 Chronic kidney disease, stage 3 unspecified: Secondary | ICD-10-CM | POA: Diagnosis not present

## 2021-04-09 DIAGNOSIS — C185 Malignant neoplasm of splenic flexure: Secondary | ICD-10-CM | POA: Insufficient documentation

## 2021-04-09 DIAGNOSIS — E1122 Type 2 diabetes mellitus with diabetic chronic kidney disease: Secondary | ICD-10-CM | POA: Diagnosis not present

## 2021-04-09 DIAGNOSIS — Z86718 Personal history of other venous thrombosis and embolism: Secondary | ICD-10-CM | POA: Insufficient documentation

## 2021-04-09 MED ORDER — CYANOCOBALAMIN 1000 MCG/ML IJ SOLN
1000.0000 ug | Freq: Once | INTRAMUSCULAR | Status: AC
  Administered 2021-04-09: 1000 ug via INTRAMUSCULAR
  Filled 2021-04-09: qty 1

## 2021-04-11 LAB — AEROBIC/ANAEROBIC CULTURE W GRAM STAIN (SURGICAL/DEEP WOUND)
Culture: NO GROWTH
Culture: NO GROWTH
Gram Stain: NONE SEEN

## 2021-04-14 LAB — METHYLMALONIC ACID, SERUM: Methylmalonic Acid, Quantitative: 824 nmol/L — ABNORMAL HIGH (ref 0–378)

## 2021-04-16 ENCOUNTER — Inpatient Hospital Stay: Payer: Medicare PPO

## 2021-04-16 ENCOUNTER — Inpatient Hospital Stay: Payer: Medicare PPO | Admitting: Hematology

## 2021-04-16 ENCOUNTER — Other Ambulatory Visit: Payer: Self-pay

## 2021-04-16 VITALS — BP 122/54 | HR 66 | Temp 97.8°F | Resp 18 | Wt 172.8 lb

## 2021-04-16 DIAGNOSIS — C186 Malignant neoplasm of descending colon: Secondary | ICD-10-CM | POA: Diagnosis not present

## 2021-04-16 DIAGNOSIS — E538 Deficiency of other specified B group vitamins: Secondary | ICD-10-CM

## 2021-04-16 DIAGNOSIS — N183 Chronic kidney disease, stage 3 unspecified: Secondary | ICD-10-CM | POA: Diagnosis not present

## 2021-04-16 DIAGNOSIS — Z85820 Personal history of malignant melanoma of skin: Secondary | ICD-10-CM | POA: Diagnosis not present

## 2021-04-16 DIAGNOSIS — Z5112 Encounter for antineoplastic immunotherapy: Secondary | ICD-10-CM | POA: Diagnosis not present

## 2021-04-16 DIAGNOSIS — E1122 Type 2 diabetes mellitus with diabetic chronic kidney disease: Secondary | ICD-10-CM | POA: Diagnosis not present

## 2021-04-16 DIAGNOSIS — C185 Malignant neoplasm of splenic flexure: Secondary | ICD-10-CM | POA: Diagnosis not present

## 2021-04-16 DIAGNOSIS — Z853 Personal history of malignant neoplasm of breast: Secondary | ICD-10-CM | POA: Diagnosis not present

## 2021-04-16 DIAGNOSIS — I129 Hypertensive chronic kidney disease with stage 1 through stage 4 chronic kidney disease, or unspecified chronic kidney disease: Secondary | ICD-10-CM | POA: Diagnosis not present

## 2021-04-16 LAB — CBC WITH DIFFERENTIAL (CANCER CENTER ONLY)
Abs Immature Granulocytes: 0.02 10*3/uL (ref 0.00–0.07)
Basophils Absolute: 0.1 10*3/uL (ref 0.0–0.1)
Basophils Relative: 1 %
Eosinophils Absolute: 0.5 10*3/uL (ref 0.0–0.5)
Eosinophils Relative: 6 %
HCT: 42.9 % (ref 36.0–46.0)
Hemoglobin: 14.3 g/dL (ref 12.0–15.0)
Immature Granulocytes: 0 %
Lymphocytes Relative: 20 %
Lymphs Abs: 1.6 10*3/uL (ref 0.7–4.0)
MCH: 34.2 pg — ABNORMAL HIGH (ref 26.0–34.0)
MCHC: 33.3 g/dL (ref 30.0–36.0)
MCV: 102.6 fL — ABNORMAL HIGH (ref 80.0–100.0)
Monocytes Absolute: 1.1 10*3/uL — ABNORMAL HIGH (ref 0.1–1.0)
Monocytes Relative: 15 %
Neutro Abs: 4.5 10*3/uL (ref 1.7–7.7)
Neutrophils Relative %: 58 %
Platelet Count: 249 10*3/uL (ref 150–400)
RBC: 4.18 MIL/uL (ref 3.87–5.11)
RDW: 20.4 % — ABNORMAL HIGH (ref 11.5–15.5)
WBC Count: 7.7 10*3/uL (ref 4.0–10.5)
nRBC: 0 % (ref 0.0–0.2)

## 2021-04-16 LAB — CMP (CANCER CENTER ONLY)
ALT: 8 U/L (ref 0–44)
AST: 16 U/L (ref 15–41)
Albumin: 4 g/dL (ref 3.5–5.0)
Alkaline Phosphatase: 95 U/L (ref 38–126)
Anion gap: 6 (ref 5–15)
BUN: 27 mg/dL — ABNORMAL HIGH (ref 8–23)
CO2: 25 mmol/L (ref 22–32)
Calcium: 9.3 mg/dL (ref 8.9–10.3)
Chloride: 108 mmol/L (ref 98–111)
Creatinine: 1.51 mg/dL — ABNORMAL HIGH (ref 0.44–1.00)
GFR, Estimated: 35 mL/min — ABNORMAL LOW (ref >60)
Glucose, Bld: 93 mg/dL (ref 70–99)
Potassium: 4.9 mmol/L (ref 3.5–5.1)
Sodium: 139 mmol/L (ref 135–145)
Total Bilirubin: 0.4 mg/dL (ref 0.3–1.2)
Total Protein: 6.4 g/dL — ABNORMAL LOW (ref 6.5–8.1)

## 2021-04-16 MED ORDER — SODIUM CHLORIDE 0.9 % IV SOLN
200.0000 mg | Freq: Once | INTRAVENOUS | Status: AC
  Administered 2021-04-16: 200 mg via INTRAVENOUS
  Filled 2021-04-16: qty 200

## 2021-04-16 MED ORDER — CYANOCOBALAMIN 1000 MCG/ML IJ SOLN
1000.0000 ug | Freq: Once | INTRAMUSCULAR | Status: AC
  Administered 2021-04-16: 1000 ug via INTRAMUSCULAR
  Filled 2021-04-16: qty 1

## 2021-04-16 MED ORDER — SODIUM CHLORIDE 0.9 % IV SOLN: Freq: Once | INTRAVENOUS | Status: AC

## 2021-04-16 NOTE — Patient Instructions (Signed)
[facility]  Discharge Instructions: ?Thank you for choosing [facility] to provide your oncology and hematology care.  ? ?If you have a lab appointment with the Cancer Center, please go directly to the Cancer Center and check in at the registration area. ?  ?Wear comfortable clothing and clothing appropriate for easy access to any Portacath or PICC line.  ? ?We strive to give you quality time with your provider. You may need to reschedule your appointment if you arrive late (15 or more minutes).  Arriving late affects you and other patients whose appointments are after yours.  Also, if you miss three or more appointments without notifying the office, you may be dismissed from the clinic at the provider?s discretion.    ?  ?For prescription refill requests, have your pharmacy contact our office and allow 72 hours for refills to be completed.   ? ?Today you received the following chemotherapy and/or immunotherapy agents: Keytruda ?  ?To help prevent nausea and vomiting after your treatment, we encourage you to take your nausea medication as directed. ? ?BELOW ARE SYMPTOMS THAT SHOULD BE REPORTED IMMEDIATELY: ?*FEVER GREATER THAN 100.4 F (38 ?C) OR HIGHER ?*CHILLS OR SWEATING ?*NAUSEA AND VOMITING THAT IS NOT CONTROLLED WITH YOUR NAUSEA MEDICATION ?*UNUSUAL SHORTNESS OF BREATH ?*UNUSUAL BRUISING OR BLEEDING ?*URINARY PROBLEMS (pain or burning when urinating, or frequent urination) ?*BOWEL PROBLEMS (unusual diarrhea, constipation, pain near the anus) ?TENDERNESS IN MOUTH AND THROAT WITH OR WITHOUT PRESENCE OF ULCERS (sore throat, sores in mouth, or a toothache) ?UNUSUAL RASH, SWELLING OR PAIN  ?UNUSUAL VAGINAL DISCHARGE OR ITCHING  ? ?Items with * indicate a potential emergency and should be followed up as soon as possible or go to the Emergency Department if any problems should occur. ? ?Please show the CHEMOTHERAPY ALERT CARD or IMMUNOTHERAPY ALERT CARD at check-in to the  Emergency Department and triage nurse. ? ?Should you have questions after your visit or need to cancel or reschedule your appointment, please contact [facility]  Dept: [phone]  and follow the prompts.  Office hours are 8:00 a.m. to 4:30 p.m. Monday - Friday. Please note that voicemails left after 4:00 p.m. may not be returned until the following business day.  We are closed weekends and major holidays. You have access to a nurse at all times for urgent questions. Please call the main number to the clinic Dept: [phone] and follow the prompts. ? ? ?For any non-urgent questions, you may also contact your provider using MyChart. We now offer e-Visits for anyone 18 and older to request care online for non-urgent symptoms. For details visit [email or web address]. ?  ?Also download the MyChart app! Go to the app store, search "MyChart", open the app, select [facility], and log in with your MyChart username and password. ? ?Due to Covid, a mask is required upon entering the hospital/clinic. If you do not have a mask, one will be given to you upon arrival. For doctor visits, patients may have 1 support person aged 18 or older with them. For treatment visits, patients cannot have anyone with them due to current Covid guidelines and our immunocompromised population.  ? ?

## 2021-04-16 NOTE — Progress Notes (Signed)
Briana Ochoa   [phone] Fax:([phone]) [phone]   Clinic Follow up Note   Patient Care Team: Nche, [name], NP as PCP - General (Internal Medicine) Fanny Skates, MD as Consulting Physician (General Surgery) Thea Silversmith, MD as Consulting Physician (Radiation Oncology) Nicholas Lose, MD as Consulting Physician (Hematology and Oncology) Garvin Fila, MD as Consulting Physician (Neurology) Consuella Lose, MD as Consulting Physician (Neurosurgery) Katy Apo, MD as Consulting Physician (Ophthalmology) Jonnie Finner, RN (Inactive) as Oncology Nurse Navigator Truitt Merle, MD as Consulting Physician (Hematology and Oncology) Leighton Ruff, MD as Consulting Physician (General Surgery)  Date of Service:  04/16/2021  CHIEF COMPLAINT: f/u of colon cancer  CURRENT THERAPY:  Xeloda and Keytruda, started 09/21/20. Held 12/25/20-02/09/21  ASSESSMENT & PLAN:  Briana Ochoa is a 81 y.o. female with   1. Colon cancer in Splenic Flexure, pT3N0M0, Stage II, MSI-High -she was diagnosed on 04/23/20 by Colonoscopy  -resection with Dr Marcello Moores on 05/28/20 showed 9.7cm mass in splenic flexure with invasive adenocarcinoma, poorly differentiated, negative margins, negative LNs.  -GuardantReveal on 07/07/20 was positive, which likely indicates microscopic disease and high risk of recurrence. -PET scan was recommended but denied by her insurance. She proceeded with CT CAP w/o contrast on 08/31/20 showing no evidence of active malignancy.  -She began Xeloda and Keytruda on 09/21/20. She has tolerated treatment well overall. However, Xeloda held due to increased Cr and then low EGFR from 10/28-02/09/21, and Beryle Flock was held due to taste change/mouth numbness from 12/25/20-02/09/21. -PET scan 03/17/21 showed: mild FDG uptake to the LUL nodule, which has increased in size since 04/2020; no evidence of metastatic disease; hypermetabolic subcentimeter nodule of right parotid  gland.  -she proceeded to bronchoscopy and biopsy on 04/06/21 under Dr. Valeta Harms. Cytology was negative for malignant cells. I reviewed with her  -We will continue current treatment, lab reviewed, adequate proceed with Keytruda today. She has started her final cycle Xeloda (to complete 6 month therapy). She will continue Keytruda for a total of one year.    2. Symptom Management: taste change/mouth numbness, mouth sore -she has experienced taste changes and mouth burning without ulceration. -symptoms persisted despite holding Xeloda and Keytruda. -No improvement with a course of steroid, indicating this is probably not related to [facility] -she reports she had a brain MRI with Dr. Kathyrn Sheriff, who told her it appeared normal. (The images are available in PACS, dated 01/19/21) -f/u with neurology as needed  -she reports a new mouth sore near her lip but mouthwash caused burning. I recommended she try a saltwater rinse.   3. H/o DVT 04/2020 -she was found to have an elevated D-dimer back on 04/08/20. She was found to have DVT in the RLE. This was still present on 04/28/20 and she was started on Eliquis shortly after. -I discussed that her chemo can increase her risk of blood clots. She notes the Eliquis is expensive. I advised her to switch to baby aspirin.   4. H/o of Left breast DCIS, H/o Bladder cancer, H/o Melanoma in 2017 of right lower leg -Diagnosed with DCIS in 2015, S/p left lumpectomy, Adjuvant Radiation and 5 years of Anastrozole. Treated by Dr Lindi Adie. -Diagnosed with bladder cancer in 02/2018, early stage, S/p surgery. Followed by Dr. Alyson Ingles, next cystoscopy scheduled for 05/03/21. -most recent mammogram from 02/11/21 was negative.   5. Genetic Testing -Given her personal history of cancer and family history of cancer, I recommend genetic testing.  -performed 07/07/20, results negative  6. Comorbidites: CKD, DM, Heart murmur, H/o mini stroke from Brain aneurysm, HTN, Osteopenia -Continue  medications and to f/u with other physicians for management.    7. B12 deficiency -Recent lab work for her taste change -Started B12 injection last week, will continue weekly for 8 weeks, then changed to monthly   PLAN:  -proceed with Keytruda today -continue final cycle Xeloda 2 tabs in morning and 3 tabs in evening for 14 days -Lab, follow-up and Keytruda in 3 and 6 weeks   No problem-specific Assessment & Plan notes found for this encounter.   SUMMARY OF ONCOLOGIC HISTORY: Oncology History Overview Note  Cancer Staging Cancer of left colon [facility]) Staging form: Colon and Rectum, AJCC 8th Edition - Pathologic stage from 05/28/2020: Stage IIA (pT3, pN0, cM0) - Signed by Truitt Merle, MD on 06/19/2020 Stage prefix: Initial diagnosis Total positive nodes: 0 Histologic grading system: 4 grade system Histologic grade (G): G3 Residual tumor (R): R0 - None  Ductal carcinoma in situ (DCIS) of left breast Staging form: Breast, AJCC 7th Edition - Clinical: Stage 0 (Tis (DCIS), N0, cM0) - Unsigned Specimen type: Core Needle Biopsy Histopathologic type: 9932 Laterality: Left Staging comments: Staged at breast conference 7.22.15  - Pathologic: Stage 0 (Tis (DCIS)(2), N0, cM0) - Unsigned Specimen type: Core Needle Biopsy Histopathologic type: 9932 Laterality: Left Tumor size (mm): 3 Multiple tumors: Yes Number of tumors: 2 Method of lymph node assessment: Clinical Method of detection of distant metastases: Clinical Residual tumor (R): R0 - None Estrogen receptor status: Positive Progesterone receptor status: Positive    Ductal carcinoma in situ (DCIS) of left breast  08/20/2013 Initial Diagnosis   Left breast 12:00: DCIS with calcifications, yet 100%, PR 100%; 09/04/2013 second group of calcifications also biopsied proven to be DCIS ER/PR positive   09/19/2013 Surgery   Left breast lumpectomy DCIS 2 foci margins negative, 0.2 and 0.3 cm ER 100% PR 100%   10/08/2013 - 11/12/2013  Radiation Therapy   Adjuvant radiation therapy   11/12/2013 - 2020 Anti-estrogen oral therapy   Anastrozole 1 mg daily plan is for 5 years   01/09/2014 - 01/10/2014 Hospital Admission   Pipeline embolization 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 The report date is 07/30/2020.   The CancerNext-Expanded + RNAinsight gene panel offered by [employer] and includes sequencing and rearrangement analysis for the following 77 genes: AIP, ALK, APC, ATM, AXIN2, BAP1, BARD1, BLM, BMPR1A, BRCA1, BRCA2, BRIP1, CDC73, CDH1, CDK4, CDKN1B, CDKN2A, CHEK2, CTNNA1, DICER1, FANCC, FH, FLCN, GALNT12, KIF1B, LZTR1, MAX, MEN1, MET, MLH1, MSH2, MSH3, MSH6, MUTYH, NBN, NF1, NF2, NTHL1, PALB2, PHOX2B, PMS2, POT1, PRKAR1A, PTCH1, PTEN, RAD51C, RAD51D, RB1, RECQL, RET, SDHA, SDHAF2, SDHB, SDHC, SDHD, SMAD4, SMARCA4, SMARCB1, SMARCE1, STK11, SUFU, TMEM127, TP53, TSC1, TSC2, VHL and XRCC2 (sequencing and deletion/duplication); EGFR, EGLN1, HOXB13, KIT, MITF, PDGFRA, POLD1 and POLE (sequencing only); EPCAM and GREM1 (deletion/duplication only). RNA data is routinely analyzed for use in variant interpretation for all genes.   Bladder cancer ([place])  02/09/2018 Initial Diagnosis   Bladder cancer ([place])   07/30/2020 Genetic Testing   Negative genetic testing:  No pathogenic variants detected on the [employer] CancerNext-Expanded + RNAinsight panel. The report date is 07/30/2020.   The CancerNext-Expanded + RNAinsight gene panel offered by [employer] and includes sequencing and rearrangement analysis for the following 77 genes: AIP, ALK, APC, ATM, AXIN2, BAP1, BARD1, BLM, BMPR1A,  BRCA1, BRCA2, BRIP1, CDC73, CDH1, CDK4, CDKN1B, CDKN2A, CHEK2, CTNNA1, DICER1, FANCC, FH, FLCN, GALNT12, KIF1B, LZTR1, MAX, MEN1, MET, MLH1, MSH2, MSH3, MSH6, MUTYH, NBN, NF1, NF2, NTHL1, PALB2, PHOX2B, PMS2, POT1, PRKAR1A, PTCH1,  PTEN, RAD51C, RAD51D, RB1, RECQL, RET, SDHA, SDHAF2, SDHB, SDHC, SDHD, SMAD4, SMARCA4, SMARCB1, SMARCE1, STK11, SUFU, TMEM127, TP53, TSC1, TSC2, VHL and XRCC2 (sequencing and deletion/duplication); EGFR, EGLN1, HOXB13, KIT, MITF, PDGFRA, POLD1 and POLE (sequencing only); EPCAM and GREM1 (deletion/duplication only). RNA data is routinely analyzed for use in variant interpretation for all genes.   08/31/2020 Imaging   CT CAP w/o contrast  IMPRESSION: 1. No specific findings of active malignancy on today's noncontrast CT. There has been interval partial colectomy involving the tumor site with reanastomosis. Some faint stranding in the adjacent omentum is probably from scarring or fat necrosis, less likely due to early tumor given the lack of overt nodularity. Surveillance imaging is likely indicated, and PET-CT may have a role in imaging follow up. 2. Several small pulmonary nodules, in addition to a larger mixed density nodule in the left upper lobe. Surveillance is recommended. 3. Other imaging findings of potential clinical significance: Aortic Atherosclerosis (ICD10-I70.0). Coronary atherosclerosis. Mitral valve calcification. Small type 1 hiatal hernia. Splenectomy with small amount of regenerative splenic tissue as well as a venous varix arising from the splenic vein. Hyperdense left kidney lower pole exophytic homogeneous lesion, probably a complex cyst. Sigmoid colon diverticulosis. Multilevel lumbar spondylosis and degenerative disc disease.   Cancer of left colon ([place])  04/23/2020 Procedure   Upper Endoscopy and Colonoscopy by Dr Carlean Purl IMPRESSION Erythematous mucosa in the antrum. Biopsied. - Small hiatal hernia. - Gastroesophageal flap valve classified as Hill Grade IV (no fold, wide open lumen, hiatal hernia present). - The examination was otherwise normal.   IMPRESSION - Decreased sphincter tone found on digital rectal exam. - Malignant partially obstructing tumor in the  proximal descending colon. Biopsied. Tattooed. - Three 5 to 20 mm polyps in the sigmoid colon, removed piecemeal using a cold snare. Resected and retrieved. - Severe diverticulosis in the sigmoid colon. - I left several diminutive descending (proximal to tattoo), sigmoid and rectal polyps - The examination was otherwise normal on direct and retroflexion views.     04/23/2020 Initial Diagnosis   Diagnosis 1. Descending Colon Polyp, mass - ADENOCARCINOMA. 2. Sigmoid Colon Polyp, (3) - MULTIPLE FRAGMENTS OF TUBULAR ADENOMA. - MULTIPLE FRAGMENTS OF SESSILE SERRATED POLYP WITHOUT DYSPLASIA. - NO HIGH GRADE DYSPLASIA OR MALIGNANCY. Microscopic Comment 1. Dr. Vic Ripper has reviewed the case. Dr. Carlean Purl was notified on 04/24/2020. 2. Despite being left sided the fragments have features of a sessile serrated polyp, as opposed to, hyperplastic polyp.   04/27/2020 Imaging   CT AP IMPRESSION: Bulky annular soft tissue mass involving the distal transverse colon with extension into adjacent pericolonic fat, consistent with primary colon carcinoma.   No evidence of metastatic disease.   Colonic diverticulosis. No radiographic evidence of diverticulitis.   Aortic Atherosclerosis (ICD10-I70.0).     05/05/2020 Imaging   CT Chest IMPRESSION: Bulky annular soft tissue mass involving the distal transverse colon with extension into adjacent pericolonic fat, consistent with primary colon carcinoma.   No evidence of metastatic disease.   Colonic diverticulosis. No radiographic evidence of diverticulitis.   Aortic Atherosclerosis (ICD10-I70.0).     05/28/2020 Initial Diagnosis   Cancer of left colon ([place])   05/28/2020 Cancer Staging   Staging form: Colon and Rectum, AJCC 8th Edition - Pathologic stage from 05/28/2020: Stage IIA (pT3, pN0,  cM0) - Signed by Truitt Merle, MD on 06/19/2020 Stage prefix: Initial diagnosis Total positive nodes: 0 Histologic grading system: 4 grade system Histologic  grade (G): G3 Residual tumor (R): R0 - None    05/28/2020 Surgery   XI ROBOT ASSISTED RESECTION OF SPLENIC FLEXURE by Dr Marcello Moores    FINAL MICROSCOPIC DIAGNOSIS:   A. COLON, SPLENIC FLEXURE, RESECTION:  - Invasive adenocarcinoma, poorly differentiated, spanning 9.7 cm.  - Tumor invades through muscularis propria into pericolonic tissue.  - Resection margins are negative.  - Tubular adenoma (X1).  - Sessile serrate polyp without dysplasia (x2).  - Diverticulosis.  - Polypectomy scar.  - Twenty-four of twenty-four lymph nodes negative for carcinoma (0/24).  - See oncology table.    07/07/2020 Miscellaneous   Guardant Reveal ctDNA positive MSI high   07/30/2020 Genetic Testing   Negative genetic testing:  No pathogenic variants detected on the [employer] CancerNext-Expanded + RNAinsight panel. The report date is 07/30/2020.   The CancerNext-Expanded + RNAinsight gene panel offered by [employer] and includes sequencing and rearrangement analysis for the following 77 genes: AIP, ALK, APC, ATM, AXIN2, BAP1, BARD1, BLM, BMPR1A, BRCA1, BRCA2, BRIP1, CDC73, CDH1, CDK4, CDKN1B, CDKN2A, CHEK2, CTNNA1, DICER1, FANCC, FH, FLCN, GALNT12, KIF1B, LZTR1, MAX, MEN1, MET, MLH1, MSH2, MSH3, MSH6, MUTYH, NBN, NF1, NF2, NTHL1, PALB2, PHOX2B, PMS2, POT1, PRKAR1A, PTCH1, PTEN, RAD51C, RAD51D, RB1, RECQL, RET, SDHA, SDHAF2, SDHB, SDHC, SDHD, SMAD4, SMARCA4, SMARCB1, SMARCE1, STK11, SUFU, TMEM127, TP53, TSC1, TSC2, VHL and XRCC2 (sequencing and deletion/duplication); EGFR, EGLN1, HOXB13, KIT, MITF, PDGFRA, POLD1 and POLE (sequencing only); EPCAM and GREM1 (deletion/duplication only). RNA data is routinely analyzed for use in variant interpretation for all genes.   08/31/2020 Imaging   CT CAP w/o contrast  IMPRESSION: 1. No specific findings of active malignancy on today's noncontrast CT. There has been interval partial colectomy involving the tumor site with reanastomosis. Some faint stranding in the  adjacent omentum is probably from scarring or fat necrosis, less likely due to early tumor given the lack of overt nodularity. Surveillance imaging is likely indicated, and PET-CT may have a role in imaging follow up. 2. Several small pulmonary nodules, in addition to a larger mixed density nodule in the left upper lobe. Surveillance is recommended. 3. Other imaging findings of potential clinical significance: Aortic Atherosclerosis (ICD10-I70.0). Coronary atherosclerosis. Mitral valve calcification. Small type 1 hiatal hernia. Splenectomy with small amount of regenerative splenic tissue as well as a venous varix arising from the splenic vein. Hyperdense left kidney lower pole exophytic homogeneous lesion, probably a complex cyst. Sigmoid colon diverticulosis. Multilevel lumbar spondylosis and degenerative disc disease.   10/02/2020 -  Chemotherapy   Patient is on Treatment Plan : COLORECTAL Pembrolizumab q21d     03/01/2021 Imaging   EXAM: CT CHEST, ABDOMEN AND PELVIS WITHOUT CONTRAST  IMPRESSION: 1. Mixed attenuation lesion posterior left upper lobe has areas of more confluent and enlarging nodularity today. Interval progression raises concern for neoplasm. Consider PET-CT to further evaluate. 2. Stable 7 mm right lower lobe pulmonary nodule with a slightly more conspicuous 3 mm nodule in the right lower lobe. Continued close attention on follow-up recommended. 3. No evidence for metastatic disease in the abdomen or pelvis. 4. Aortic Atherosclerosis (ICD10-I70.0).   03/17/2021 PET scan   IMPRESSION: 1. Part solid nodule of the left upper lobe demonstrates mild FDG uptake, and has increased in size when compared with prior exam dated May 05, 2020. Concerning for indolent primary lung neoplasm. 2. No evidence of metastatic  disease in the chest, abdomen or pelvis. 3. 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 Recommend further  evaluation with tissue sampling. 4. Aortic Atherosclerosis (ICD10-I70.0).      INTERVAL HISTORY:  DAJHA URQUILLA is here for a follow up of colon cancer. She was last seen by me on 03/26/21. She presents to the clinic alone. She reports a bothersome mouth sores. She notes she tried mouthwash, but this causes a burning sensation.   All other systems were reviewed with the patient and are negative.  MEDICAL HISTORY:  Past Medical History:  Diagnosis Date   Allergy    seasonal allergies   Anemia    on meds   Aortic atherosclerosis (HCC)    Bladder cancer ([place]) 2020   Bladder tumor    Bloody diarrhea 09/29/2017   Breast cancer ([place]) 2015   Left Breast Cancer   Cataract    bilateral -sx   CKD (chronic kidney disease), stage III ([place])    DM related   Colon cancer ([place])    Diabetes mellitus type 2, diet-controlled ([place])    diet controlled   Dyspnea 09/28/2017   Dysuria    Family history of adverse reaction to anesthesia    aunt- N/V    Family history of breast cancer    Family history of kidney cancer    Family history of throat cancer    Family history of uterine cancer    Fatigue 09/28/2017   Frequency of urination    Grade I diastolic dysfunction 26/33/3545   Noted on ECHO   Gross hematuria 09/29/2017   Heart murmur    since rheumatic fever as child   Hematuria    History of cardiomegaly 02/12/2005   Mild, noted on CXR   History of CVA (cerebrovascular accident) 08/05/2014   post op cerebral angiogram, cerebral thrombosis w/ cerebral infarction;  11-02-2017  per pt no residuals   History of malignant melanoma of skin 12/2015   s/p wide local excision right lower leg (per pt localized)   History of rheumatic fever as a child    Hyperlipidemia    on meds   Hypertension    on meds   Intracranial aneurysm dx 07/ 2015   paraophthalmic RICA aneurysm /   s/p  Pipeline embolization right ICA 01-09-2014   Jaundice 09/28/2017   Malignant neoplasm of central portion  of left breast in female, estrogen receptor positive [facility]) oncologist-  dr Lindi Adie   dx 07/ 2015--- DCIS, ER/PR positive-- s/p  left breast lumpectomy 09-19-2013,  completed radiation 11-12-2013,  started arimidex 11-12-2013   Melanoma ([place])    righ tleg    OA (osteoarthritis)    knees, hands, R shoulder   Osteopenia 10/16/2012   Diffuse   Personal history of radiation therapy    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   Sigmoid diverticulosis    Weakness 09/28/2017   Wears dentures    upper   Wears glasses    Wears hearing aid in both ears     SURGICAL HISTORY: Past Surgical History:  Procedure Laterality Date   BREAST LUMPECTOMY Left 2015   BREAST LUMPECTOMY WITH NEEDLE LOCALIZATION Left 09/19/2013   Procedure: LEFT BREAST LUMPECTOMY WITH DOUBLE WIRE BRACKETED  NEEDLE LOCALIZATION;  Surgeon: [name], MD;  Location: [place];  Service: General;  Laterality: Left;   BRONCHIAL BIOPSY  04/06/2021   Procedure: BRONCHIAL BIOPSIES;  Surgeon: Garner Nash, DO;  Location: [place] [facility];  Service: Pulmonary;;   BRONCHIAL BRUSHINGS  04/06/2021   Procedure: BRONCHIAL BRUSHINGS;  Surgeon: Garner Nash, DO;  Location: [place] [facility];  Service: Pulmonary;;   CATARACT EXTRACTION W/ INTRAOCULAR LENS  IMPLANT, BILATERAL  06/2015   CESAREAN SECTION  1964   CHOLECYSTECTOMY OPEN  1970s   CYSTOSCOPY W/ URETERAL STENT PLACEMENT Left 11/07/2017   Procedure: CYSTOSCOPY WITH RETROGRADE PYELOGRAM/URETERAL STENT PLACEMENT;  Surgeon: Cleon Gustin, MD;  [facility];  Service: Urology;  Laterality: Left;   CYSTOSCOPY W/ URETERAL STENT PLACEMENT Bilateral 04/27/2018   Procedure: CYSTOSCOPY WITH RETROGRADE PYELOGRAM;  Surgeon: Cleon Gustin, MD;  Location[facility];  Service: Urology;  Laterality: Bilateral;   FINGER SURGERY     lt thumb  from dog bite   IR  NEPHROURETERAL CATH PLACE LEFT  04/28/2018   MELANOMA EXCISION Right 12/28/2015    Procedure: EXCISION MELANOMA RIGHT LOWER LEG;  Surgeon: Fanny Skates, MD;  Location: [place];  Service: General;  Laterality: Right;   RADIOLOGY WITH ANESTHESIA N/A 01/09/2014   Procedure: Embolization;  Surgeon: Consuella Lose, MD;  Location: [facility];  Service: Radiology;  Laterality: N/A;   SPLENECTOMY, TOTAL  2009 approx.   splen injury due to fall off horse   TONSILLECTOMY  child   TRANSURETHRAL RESECTION OF BLADDER TUMOR N/A 11/07/2017   Procedure: TRANSURETHRAL RESECTION OF BLADDER TUMOR (TURBT), POSSIBLE STENT PLACEMENT;  Surgeon: Cleon Gustin, MD;  Location: [facility];  Service: Urology;  Laterality: N/A;   TRANSURETHRAL RESECTION OF BLADDER TUMOR N/A 12/14/2017   Procedure: TRANSURETHRAL RESECTION OF BLADDER TUMOR (TURBT);  Surgeon: Cleon Gustin, MD;  Location: [facility];  Service: Urology;  Laterality: N/A;  [place] TUMOR N/A 04/27/2018   Procedure: TRANSURETHRAL RESECTION OF BLADDER TUMOR (TURBT);  Surgeon: Cleon Gustin, MD;  Location: [facility];  Service: Urology;  Laterality: N/A;  [place] ULTRASOUND  04/06/2021   Procedure: RADIAL ENDOBRONCHIAL ULTRASOUND;  Surgeon: Garner Nash, DO;  Location: [place] [facility];  Service: Pulmonary;;    I have reviewed the social history and family history with the patient and they are unchanged from previous note.  ALLERGIES:  is allergic to feraheme [ferumoxytol].  MEDICATIONS:  Current Outpatient Medications  Medication Sig Dispense Refill   acetaminophen (TYLENOL) 500 MG tablet Take 1,000 mg by mouth daily.     capecitabine (XELODA) 500 MG tablet Take 2 tablets (1000 mg total) by mouth in morning and 3 tablets (1500 mg total) by mouth in evening. Take within 30 minutes after meals. Take for 14 days on and 7 days off. Repeat every 21 days. 70 tablet 1   glucose blood (TRUE  METRIX BLOOD GLUCOSE TEST) test strip Use as instructed to test blood sugar once daily E08.638 100 each 2   lisinopril (ZESTRIL) 20 MG tablet TAKE 1 TABLET EVERY DAY 90 tablet 3   ondansetron (ZOFRAN) 8 MG tablet Take 1 tablet (8 mg total) by mouth every 8 (eight) hours as needed for nausea or vomiting. 20 tablet 0   pravastatin (PRAVACHOL) 10 MG tablet TAKE 1 TABLET EVERY DAY (STOP PRAVASTATIN 20MG) 90 tablet 3   predniSONE (STERAPRED UNI-PAK 21 TAB) 10 MG (21) TBPK tablet Take 4 tabs daily for 2 days then drip one tab every 2 days until finish. 21 tablet 0   No current facility-administered  medications for this visit.    PHYSICAL EXAMINATION: ECOG PERFORMANCE STATUS: 1 - Symptomatic but completely ambulatory  Vitals:   04/16/21 1248  BP: (!) 122/54  Pulse: 66  Resp: 18  Temp: 97.8 F (36.6 C)  SpO2: 97%   Wt Readings from Last 3 Encounters:  04/16/21 172 lb 12.8 oz (78.4 kg)  04/06/21 173 lb (78.5 kg)  04/02/21 173 lb (78.5 kg)     GENERAL:alert, no distress and comfortable SKIN: skin color normal, no rashes or significant lesions EYES: normal, Conjunctiva are pink and non-injected, sclera clear  NEURO: alert & oriented x 3 with fluent speech  LABORATORY DATA:  I have reviewed the data as listed CBC Latest Ref Rng & Units 04/16/2021 03/26/2021 03/01/2021  WBC 4.0 - 10.5 K/uL 7.7 8.9 8.8  Hemoglobin 12.0 - 15.0 g/dL 14.3 14.3 14.1  Hematocrit 36.0 - 46.0 % 42.9 41.0 42.6  Platelets 150 - 400 K/uL 249 264 290     CMP Latest Ref Rng & Units 04/16/2021 03/26/2021 03/01/2021  Glucose 70 - 99 mg/dL 93 118(H) 109(H)  BUN 8 - 23 mg/dL 27(H) 28(H) 25(H)  Creatinine 0.44 - 1.00 mg/dL 1.51(H) 1.50(H) 1.48(H)  Sodium 135 - 145 mmol/L 139 139 137  Potassium 3.5 - 5.1 mmol/L 4.9 4.9 4.9  Chloride 98 - 111 mmol/L 108 107 106  CO2 22 - 32 mmol/L _0 Calcium 8.9 - 10.3 mg/dL 9.3 9.2 8.9  Total Protein 6.5 - 8.1 g/dL 6.4(L) 6.4(L) 6.6  Total Bilirubin 0.3 - 1.2 mg/dL 0.4 0.5 0.4   Alkaline Phos 38 - 126 U/L 95 80 65  AST 15 - 41 U/L _1 ALT 0 - 44 U/L _2 RADIOGRAPHIC STUDIES: I have personally reviewed the radiological images as listed and agreed with the findings in the report. No results found.    No orders of the defined types were placed in this encounter.  All questions were answered. The patient knows to call the clinic with any problems, questions or concerns. No barriers to learning was detected. The total time spent in the appointment was 30 minutes.     Truitt Merle, MD 04/16/2021  I, Wilburn Mylar, am acting as scribe for Truitt Merle, MD.   I have reviewed the above documentation for accuracy and completeness, and I agree with the above.

## 2021-04-16 NOTE — Progress Notes (Signed)
OK to trt w/ elevated SCR 1.51 today per Dr. Burr Medico ?

## 2021-04-17 ENCOUNTER — Encounter: Payer: Self-pay | Admitting: Hematology

## 2021-04-19 ENCOUNTER — Other Ambulatory Visit (HOSPITAL_COMMUNITY): Payer: Self-pay

## 2021-04-20 DIAGNOSIS — R911 Solitary pulmonary nodule: Secondary | ICD-10-CM

## 2021-04-21 ENCOUNTER — Other Ambulatory Visit (HOSPITAL_COMMUNITY): Payer: Self-pay

## 2021-04-21 ENCOUNTER — Telehealth: Payer: Self-pay | Admitting: Nurse Practitioner

## 2021-04-21 NOTE — Chronic Care Management (AMB) (Signed)
?  Chronic Care Management  ? ?Outreach Note ? ?04/21/2021 ?Name: Briana Ochoa MRN: [id number] DOB: 02/11/1940 ? ?Referred by: Flossie Buffy, NP ?Reason for referral : No chief complaint on file. ? ? ?An unsuccessful telephone outreach was attempted today. The patient was referred to the pharmacist for assistance with care management and care coordination.  ? ?Follow Up Plan:  ? ?Tatjana Dellinger ?Upstream Scheduler  ?

## 2021-04-23 ENCOUNTER — Other Ambulatory Visit (HOSPITAL_COMMUNITY): Payer: Self-pay

## 2021-04-23 ENCOUNTER — Inpatient Hospital Stay: Payer: Medicare PPO

## 2021-04-23 ENCOUNTER — Other Ambulatory Visit: Payer: Self-pay

## 2021-04-23 DIAGNOSIS — Z5112 Encounter for antineoplastic immunotherapy: Secondary | ICD-10-CM | POA: Diagnosis not present

## 2021-04-23 DIAGNOSIS — Z853 Personal history of malignant neoplasm of breast: Secondary | ICD-10-CM | POA: Diagnosis not present

## 2021-04-23 DIAGNOSIS — C186 Malignant neoplasm of descending colon: Secondary | ICD-10-CM | POA: Diagnosis not present

## 2021-04-23 DIAGNOSIS — Z85820 Personal history of malignant melanoma of skin: Secondary | ICD-10-CM | POA: Diagnosis not present

## 2021-04-23 DIAGNOSIS — C185 Malignant neoplasm of splenic flexure: Secondary | ICD-10-CM | POA: Diagnosis not present

## 2021-04-23 DIAGNOSIS — N183 Chronic kidney disease, stage 3 unspecified: Secondary | ICD-10-CM | POA: Diagnosis not present

## 2021-04-23 DIAGNOSIS — I129 Hypertensive chronic kidney disease with stage 1 through stage 4 chronic kidney disease, or unspecified chronic kidney disease: Secondary | ICD-10-CM | POA: Diagnosis not present

## 2021-04-23 DIAGNOSIS — E1122 Type 2 diabetes mellitus with diabetic chronic kidney disease: Secondary | ICD-10-CM | POA: Diagnosis not present

## 2021-04-23 DIAGNOSIS — E538 Deficiency of other specified B group vitamins: Secondary | ICD-10-CM

## 2021-04-23 MED ORDER — CYANOCOBALAMIN 1000 MCG/ML IJ SOLN
1000.0000 ug | Freq: Once | INTRAMUSCULAR | Status: AC
  Administered 2021-04-23: 1000 ug via INTRAMUSCULAR
  Filled 2021-04-23: qty 1

## 2021-04-27 ENCOUNTER — Ambulatory Visit: Payer: Medicare PPO | Admitting: Acute Care

## 2021-04-28 ENCOUNTER — Telehealth: Payer: Self-pay | Admitting: Nurse Practitioner

## 2021-04-28 NOTE — Chronic Care Management (AMB) (Signed)
?  Chronic Care Management  ? ?Note ? ?04/28/2021 ?Name: SHERRYANN FRESE MRN: [id number] DOB: 1940-04-19 ? ?YASMENE SALOMONE is a 81 y.o. year old female who is a primary care patient of Nche, [name], NP. I reached out to Jenne Pane by phone today in response to a referral sent by Ms. Farley Ly Moll's PCP, Nche, [name], NP.  ? ?Ms. Guimaraes was given information about Chronic Care Management services today including:  ?CCM service includes personalized support from designated clinical staff supervised by her physician, including individualized plan of care and coordination with other care providers ?24/7 contact phone numbers for assistance for urgent and routine care needs. ?Service will only be billed when office clinical staff spend 20 minutes or more in a month to coordinate care. ?Only one practitioner may furnish and bill the service in a calendar month. ?The patient may stop CCM services at any time (effective at the end of the month) by phone call to the office staff. ? ? ?Patient wishes to consider information provided and/or speak with a member of the care team before deciding about enrollment in care management services.  ? ?Follow up plan: ? ? ?Tatjana Dellinger ?Upstream Scheduler  ?

## 2021-04-30 ENCOUNTER — Other Ambulatory Visit: Payer: Self-pay

## 2021-04-30 ENCOUNTER — Inpatient Hospital Stay (HOSPITAL_BASED_OUTPATIENT_CLINIC_OR_DEPARTMENT_OTHER): Payer: Medicare PPO

## 2021-04-30 DIAGNOSIS — Z853 Personal history of malignant neoplasm of breast: Secondary | ICD-10-CM | POA: Diagnosis not present

## 2021-04-30 DIAGNOSIS — E538 Deficiency of other specified B group vitamins: Secondary | ICD-10-CM | POA: Diagnosis not present

## 2021-04-30 DIAGNOSIS — C186 Malignant neoplasm of descending colon: Secondary | ICD-10-CM | POA: Diagnosis not present

## 2021-04-30 DIAGNOSIS — E1122 Type 2 diabetes mellitus with diabetic chronic kidney disease: Secondary | ICD-10-CM | POA: Diagnosis not present

## 2021-04-30 DIAGNOSIS — N183 Chronic kidney disease, stage 3 unspecified: Secondary | ICD-10-CM | POA: Diagnosis not present

## 2021-04-30 DIAGNOSIS — C185 Malignant neoplasm of splenic flexure: Secondary | ICD-10-CM | POA: Diagnosis not present

## 2021-04-30 DIAGNOSIS — Z85820 Personal history of malignant melanoma of skin: Secondary | ICD-10-CM | POA: Diagnosis not present

## 2021-04-30 DIAGNOSIS — Z5112 Encounter for antineoplastic immunotherapy: Secondary | ICD-10-CM | POA: Diagnosis not present

## 2021-04-30 DIAGNOSIS — I129 Hypertensive chronic kidney disease with stage 1 through stage 4 chronic kidney disease, or unspecified chronic kidney disease: Secondary | ICD-10-CM | POA: Diagnosis not present

## 2021-04-30 MED ORDER — CYANOCOBALAMIN 1000 MCG/ML IJ SOLN
1000.0000 ug | Freq: Once | INTRAMUSCULAR | Status: AC
  Administered 2021-04-30: 1000 ug via INTRAMUSCULAR
  Filled 2021-04-30: qty 1

## 2021-05-03 ENCOUNTER — Other Ambulatory Visit: Payer: Medicare PPO | Admitting: Urology

## 2021-05-03 DIAGNOSIS — C679 Malignant neoplasm of bladder, unspecified: Secondary | ICD-10-CM

## 2021-05-04 ENCOUNTER — Ambulatory Visit: Payer: Medicare PPO | Admitting: Nurse Practitioner

## 2021-05-05 ENCOUNTER — Encounter: Payer: Self-pay | Admitting: Pulmonary Disease

## 2021-05-05 ENCOUNTER — Other Ambulatory Visit (HOSPITAL_COMMUNITY): Payer: Self-pay

## 2021-05-05 ENCOUNTER — Ambulatory Visit: Payer: Medicare PPO | Admitting: Pulmonary Disease

## 2021-05-05 VITALS — BP 110/64 | HR 70 | Temp 98.1°F | Ht 64.0 in | Wt 174.6 lb

## 2021-05-05 DIAGNOSIS — Z8551 Personal history of malignant neoplasm of bladder: Secondary | ICD-10-CM

## 2021-05-05 DIAGNOSIS — Z8049 Family history of malignant neoplasm of other genital organs: Secondary | ICD-10-CM

## 2021-05-05 DIAGNOSIS — R911 Solitary pulmonary nodule: Secondary | ICD-10-CM

## 2021-05-05 DIAGNOSIS — Z853 Personal history of malignant neoplasm of breast: Secondary | ICD-10-CM

## 2021-05-05 DIAGNOSIS — Z803 Family history of malignant neoplasm of breast: Secondary | ICD-10-CM | POA: Diagnosis not present

## 2021-05-05 DIAGNOSIS — Z8051 Family history of malignant neoplasm of kidney: Secondary | ICD-10-CM | POA: Diagnosis not present

## 2021-05-05 DIAGNOSIS — Z87891 Personal history of nicotine dependence: Secondary | ICD-10-CM

## 2021-05-05 DIAGNOSIS — Z85038 Personal history of other malignant neoplasm of large intestine: Secondary | ICD-10-CM

## 2021-05-05 DIAGNOSIS — Z800 Family history of malignant neoplasm of digestive organs: Secondary | ICD-10-CM | POA: Diagnosis not present

## 2021-05-05 NOTE — Patient Instructions (Signed)
Thank you for visiting Dr. Valeta Harms at [facility]. ?Today we recommend the following: ? ?Orders Placed This Encounter  ?Procedures  ? CT Super D Chest Wo Contrast  ? ?Follow up ct chest in August 2023 and see [place] afterwards.  ? ?Return in about 5 months (around 10/05/2021) for with Eric Form, NP, or Dr. Valeta Harms. ? ? ? ?Please do your part to reduce the spread of COVID-19.  ? ?

## 2021-05-05 NOTE — Progress Notes (Signed)
? ?Synopsis: Referred in February 2023 for lung nodule by Nche, [name], NP ? ?Subjective:  ? ?PATIENT ID: Briana Ochoa GENDER: female DOB: [birth year], MRN: [id number] ? ?Chief Complaint  ?Patient presents with  ? Follow-up  ?  Follow up. Patient has no complaints.   ? ? ?This is a 81 year old female, past medical history of bladder cancer, breast cancer, colon cancer.She had a stage IIa colon cancer and a ductal carcinoma in situ.  Patient is being followed by medical oncology.  She had recent restaging CT scan chest abdomen pelvis which revealed progressive enlargement of a left upper lobe nodule.On nuclear medicine pet imaging completed 03/17/2021 revealed a part solid lesion within the posterior left upper lobe with an SUV max of 2.5 due to the progression and change of the lesion there was concern for indolent neoplasm.  Patient was referred for discussion of robotic assisted navigational bronchoscopy and tissue sampling. ? ?OV 05/05/2021: Here today for follow-up after bronchoscopy.  Biopsy specimens were negative for malignancy.  From respiratory standpoint doing well.  Has already seen medical oncology in follow-up.  Wanted to come and see how she is doing.  Still able to complete all activities of daily living.  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 ? ? ?Oncology History Overview Note  ?Cancer Staging ?Cancer of left colon ([place]) ?Staging form: Colon and Rectum, AJCC 8th Edition ?- Pathologic stage from 05/28/2020: Stage IIA (pT3, pN0, cM0) - Signed by Truitt Merle, MD on 06/19/2020 ?Stage prefix: Initial diagnosis ?Total positive nodes: 0 ?Histologic grading system: 4 grade system ?Histologic grade (G): G3 ?Residual tumor (R): R0 - None ? ?Ductal carcinoma in situ (DCIS) of left breast ?Staging form: Breast, AJCC 7th Edition ?- Clinical: Stage 0 (Tis (DCIS), N0, cM0) - Unsigned ?Specimen type: Core Needle Biopsy ?Histopathologic type:  9932 ?Laterality: Left ?Staging comments: Staged at breast conference 7.22.15 ? ?- Pathologic: Stage 0 (Tis (DCIS)(2), N0, cM0) - Unsigned ?Specimen type: Core Needle Biopsy ?Histopathologic type: 9932 ?Laterality: Left ?Tumor size (mm): 3 ?Multiple tumors: Yes ?Number of tumors: 2 ?Method of lymph node assessment: Clinical ?Method of detection of distant metastases: Clinical ?Residual tumor (R): R0 - None ?Estrogen receptor status: Positive ?Progesterone receptor status: Positive ? ?  ?Ductal carcinoma in situ (DCIS) of left breast  ?08/20/2013 Initial Diagnosis  ? Left breast 12:00: DCIS with calcifications, yet 100%, PR 100%; 09/04/2013 second group of calcifications also biopsied proven to be DCIS ER/PR positive ?  ?09/19/2013 Surgery  ? Left breast lumpectomy DCIS 2 foci margins negative, 0.2 and 0.3 cm ER 100% PR 100% ?  ?10/08/2013 - 11/12/2013 Radiation Therapy  ? Adjuvant radiation therapy ?  ?11/12/2013 - 2020 Anti-estrogen oral therapy  ? Anastrozole 1 mg daily plan is for 5 years ?  ?01/09/2014 - 01/10/2014 Hospital Admission  ? 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  ?07/30/2020 Genetic Testing  ? Negative genetic testing:  No pathogenic variants detected on the [employer] CancerNext-Expanded + RNAinsight panel. The report date is 07/30/2020.  ? ?The CancerNext-Expanded + RNAinsight gene panel offered by [employer] and includes sequencing and rearrangement analysis for the following 77 genes: AIP, ALK, APC, ATM, AXIN2, BAP1, BARD1, BLM, BMPR1A, BRCA1, BRCA2, BRIP1, CDC73, CDH1, CDK4, CDKN1B, CDKN2A, CHEK2, CTNNA1, DICER1, FANCC, FH, FLCN, GALNT12, KIF1B, LZTR1, MAX, MEN1, MET, MLH1, MSH2, MSH3, MSH6, MUTYH, NBN, NF1, NF2, NTHL1, PALB2, PHOX2B, PMS2, POT1, PRKAR1A, PTCH1, PTEN, RAD51C, RAD51D, RB1,  RECQL, RET, SDHA, SDHAF2, SDHB, SDHC, SDHD, SMAD4, SMARCA4, SMARCB1, SMARCE1, STK11, SUFU, TMEM127, TP53, TSC1, TSC2, VHL and XRCC2 (sequencing and deletion/duplication); EGFR, EGLN1, HOXB13, KIT, MITF,  PDGFRA, POLD1 and POLE (sequencing only); EPCAM and GREM1 (deletion/duplication only). RNA data is routinely analyzed for use in variant interpretation for all genes. ?  ?Bladder cancer ([place])  ?02/09/2018 Initial Diagnosis  ? Bladder cancer [facility]) ?  ?07/30/2020 Genetic Testing  ? Negative genetic testing:  No pathogenic variants detected on the [employer] CancerNext-Expanded + RNAinsight panel. The report date is 07/30/2020.  ? ?The CancerNext-Expanded + RNAinsight gene panel offered by [employer] and includes sequencing and rearrangement analysis for the following 77 genes: AIP, ALK, APC, ATM, AXIN2, BAP1, BARD1, BLM, BMPR1A, BRCA1, BRCA2, BRIP1, CDC73, CDH1, CDK4, CDKN1B, CDKN2A, CHEK2, CTNNA1, DICER1, FANCC, FH, FLCN, GALNT12, KIF1B, LZTR1, MAX, MEN1, MET, MLH1, MSH2, MSH3, MSH6, MUTYH, NBN, NF1, NF2, NTHL1, PALB2, PHOX2B, PMS2, POT1, PRKAR1A, PTCH1, PTEN, RAD51C, RAD51D, RB1, RECQL, RET, SDHA, SDHAF2, SDHB, SDHC, SDHD, SMAD4, SMARCA4, SMARCB1, SMARCE1, STK11, SUFU, TMEM127, TP53, TSC1, TSC2, VHL and XRCC2 (sequencing and deletion/duplication); EGFR, EGLN1, HOXB13, KIT, MITF, PDGFRA, POLD1 and POLE (sequencing only); EPCAM and GREM1 (deletion/duplication only). RNA data is routinely analyzed for use in variant interpretation for all genes. ?  ?08/31/2020 Imaging  ? CT CAP w/o contrast ? ?IMPRESSION: ?1. No specific findings of active malignancy on today's noncontrast ?CT. There has been interval partial colectomy involving the tumor ?site with reanastomosis. Some faint stranding in the adjacent ?omentum is probably from scarring or fat necrosis, less likely due ?to early tumor given the lack of overt nodularity. Surveillance ?imaging is likely indicated, and PET-CT may have a role in imaging ?follow up. ?2. Several small pulmonary nodules, in addition to a larger mixed ?density nodule in the left upper lobe. Surveillance is recommended. ?3. Other imaging findings of potential clinical significance:  Aortic ?Atherosclerosis (ICD10-I70.0). Coronary atherosclerosis. Mitral ?valve calcification. Small type 1 hiatal hernia. Splenectomy with ?small amount of regenerative splenic tissue as well as a venous ?varix arising from the splenic vein. Hyperdense left kidney lower ?pole exophytic homogeneous lesion, probably a complex cyst. Sigmoid colon diverticulosis. Multilevel lumbar spondylosis and degenerative disc disease. ?  ?Cancer of left colon ([place])  ?04/23/2020 Procedure  ? Upper Endoscopy and Colonoscopy by Dr Carlean Purl ?IMPRESSION ?Erythematous mucosa in the antrum. Biopsied. ?- Small hiatal hernia. ?- Gastroesophageal flap valve classified as Hill Grade IV (no fold, wide open lumen, hiatal ?hernia present). ?- The examination was otherwise normal. ? ? ?IMPRESSION ?- Decreased sphincter tone found on digital rectal exam. ?- Malignant partially obstructing tumor in the proximal descending colon. Biopsied. Tattooed. ?- Three 5 to 20 mm polyps in the sigmoid colon, removed piecemeal using a cold snare. ?Resected and retrieved. ?- Severe diverticulosis in the sigmoid colon. ?- I left several diminutive descending (proximal to tattoo), sigmoid and rectal polyps ?- The examination was otherwise normal on direct and retroflexion views. ? ? ?  ?04/23/2020 Initial Diagnosis  ? Diagnosis ?1. Descending Colon Polyp, mass ?- ADENOCARCINOMA. ?2. Sigmoid Colon Polyp, (3) ?- MULTIPLE FRAGMENTS OF TUBULAR ADENOMA. ?- MULTIPLE FRAGMENTS OF SESSILE SERRATED POLYP WITHOUT DYSPLASIA. ?- NO HIGH GRADE DYSPLASIA OR MALIGNANCY. ?Microscopic Comment ?1. Dr. Vic Ripper has reviewed the case. Dr. Carlean Purl was notified on 04/24/2020. ?2. Despite being left sided the fragments have features of a sessile serrated polyp, as opposed to, hyperplastic polyp. ?  ?04/27/2020 Imaging  ? CT AP ?IMPRESSION: ?Bulky annular soft tissue mass involving the distal  transverse colon ?with extension into adjacent pericolonic fat, consistent with ?primary colon  carcinoma. ?  ?No evidence of metastatic disease. ?  ?Colonic diverticulosis. No radiographic evidence of diverticulitis. ?  ?Aortic Atherosclerosis (ICD10-I70.0). ?  ?  ?05/05/2020 Imaging  ? CT Chest ?IMPRESSION: ?Bulky annular soft ti

## 2021-05-06 LAB — FUNGUS CULTURE WITH STAIN

## 2021-05-06 LAB — FUNGAL ORGANISM REFLEX

## 2021-05-06 LAB — FUNGUS CULTURE RESULT

## 2021-05-07 ENCOUNTER — Other Ambulatory Visit: Payer: Self-pay

## 2021-05-07 ENCOUNTER — Inpatient Hospital Stay: Payer: Medicare PPO

## 2021-05-07 ENCOUNTER — Inpatient Hospital Stay: Payer: Medicare PPO | Admitting: Hematology

## 2021-05-07 VITALS — BP 127/58 | HR 69 | Temp 97.8°F | Resp 18 | Ht 64.0 in | Wt 174.5 lb

## 2021-05-07 DIAGNOSIS — C186 Malignant neoplasm of descending colon: Secondary | ICD-10-CM

## 2021-05-07 DIAGNOSIS — I129 Hypertensive chronic kidney disease with stage 1 through stage 4 chronic kidney disease, or unspecified chronic kidney disease: Secondary | ICD-10-CM | POA: Diagnosis not present

## 2021-05-07 DIAGNOSIS — E538 Deficiency of other specified B group vitamins: Secondary | ICD-10-CM | POA: Diagnosis not present

## 2021-05-07 DIAGNOSIS — E1122 Type 2 diabetes mellitus with diabetic chronic kidney disease: Secondary | ICD-10-CM | POA: Diagnosis not present

## 2021-05-07 DIAGNOSIS — Z85820 Personal history of malignant melanoma of skin: Secondary | ICD-10-CM | POA: Diagnosis not present

## 2021-05-07 DIAGNOSIS — Z853 Personal history of malignant neoplasm of breast: Secondary | ICD-10-CM | POA: Diagnosis not present

## 2021-05-07 DIAGNOSIS — N183 Chronic kidney disease, stage 3 unspecified: Secondary | ICD-10-CM | POA: Diagnosis not present

## 2021-05-07 DIAGNOSIS — C185 Malignant neoplasm of splenic flexure: Secondary | ICD-10-CM | POA: Diagnosis not present

## 2021-05-07 DIAGNOSIS — Z5112 Encounter for antineoplastic immunotherapy: Secondary | ICD-10-CM | POA: Diagnosis not present

## 2021-05-07 LAB — CBC WITH DIFFERENTIAL (CANCER CENTER ONLY)
Abs Immature Granulocytes: 0.03 10*3/uL (ref 0.00–0.07)
Basophils Absolute: 0.1 10*3/uL (ref 0.0–0.1)
Basophils Relative: 1 %
Eosinophils Absolute: 0.6 10*3/uL — ABNORMAL HIGH (ref 0.0–0.5)
Eosinophils Relative: 7 %
HCT: 41.8 % (ref 36.0–46.0)
Hemoglobin: 14.2 g/dL (ref 12.0–15.0)
Immature Granulocytes: 0 %
Lymphocytes Relative: 21 %
Lymphs Abs: 1.9 10*3/uL (ref 0.7–4.0)
MCH: 35 pg — ABNORMAL HIGH (ref 26.0–34.0)
MCHC: 34 g/dL (ref 30.0–36.0)
MCV: 103 fL — ABNORMAL HIGH (ref 80.0–100.0)
Monocytes Absolute: 1.3 10*3/uL — ABNORMAL HIGH (ref 0.1–1.0)
Monocytes Relative: 14 %
Neutro Abs: 5.1 10*3/uL (ref 1.7–7.7)
Neutrophils Relative %: 57 %
Platelet Count: 256 10*3/uL (ref 150–400)
RBC: 4.06 MIL/uL (ref 3.87–5.11)
RDW: 21.2 % — ABNORMAL HIGH (ref 11.5–15.5)
WBC Count: 9.1 10*3/uL (ref 4.0–10.5)
nRBC: 0.2 % (ref 0.0–0.2)

## 2021-05-07 LAB — CMP (CANCER CENTER ONLY)
ALT: 9 U/L (ref 0–44)
AST: 13 U/L — ABNORMAL LOW (ref 15–41)
Albumin: 4 g/dL (ref 3.5–5.0)
Alkaline Phosphatase: 96 U/L (ref 38–126)
Anion gap: 8 (ref 5–15)
BUN: 27 mg/dL — ABNORMAL HIGH (ref 8–23)
CO2: 24 mmol/L (ref 22–32)
Calcium: 9.6 mg/dL (ref 8.9–10.3)
Chloride: 108 mmol/L (ref 98–111)
Creatinine: 1.76 mg/dL — ABNORMAL HIGH (ref 0.44–1.00)
GFR, Estimated: 29 mL/min — ABNORMAL LOW (ref >60)
Glucose, Bld: 108 mg/dL — ABNORMAL HIGH (ref 70–99)
Potassium: 4.8 mmol/L (ref 3.5–5.1)
Sodium: 140 mmol/L (ref 135–145)
Total Bilirubin: 0.4 mg/dL (ref 0.3–1.2)
Total Protein: 7 g/dL (ref 6.5–8.1)

## 2021-05-07 LAB — CEA (IN HOUSE-CHCC): CEA (CHCC-In House): 3.84 ng/mL (ref 0.00–5.00)

## 2021-05-07 MED ORDER — SODIUM CHLORIDE 0.9 % IV SOLN: Freq: Once | INTRAVENOUS | Status: AC

## 2021-05-07 MED ORDER — CYANOCOBALAMIN 1000 MCG/ML IJ SOLN
1000.0000 ug | Freq: Once | INTRAMUSCULAR | Status: AC
  Administered 2021-05-07: 1000 ug via INTRAMUSCULAR
  Filled 2021-05-07: qty 1

## 2021-05-07 MED ORDER — SODIUM CHLORIDE 0.9 % IV SOLN
200.0000 mg | Freq: Once | INTRAVENOUS | Status: AC
  Administered 2021-05-07: 200 mg via INTRAVENOUS
  Filled 2021-05-07: qty 200

## 2021-05-07 NOTE — Progress Notes (Signed)
?[place]   ?[phone] [phone]   ?Clinic Follow up Note  ? ?Patient Care Team: ?Nche, [name], NP as PCP - General (Internal Medicine) ?Fanny Skates, MD as Consulting Physician (General Surgery) ?Thea Silversmith, MD as Consulting Physician (Radiation Oncology) ?Nicholas Lose, MD as Consulting Physician (Hematology and Oncology) ?Garvin Fila, MD as Consulting Physician (Neurology) ?Consuella Lose, MD as Consulting Physician (Neurosurgery) ?Katy Apo, MD as Consulting Physician (Ophthalmology) ?Jonnie Finner, RN (Inactive) as Oncology Nurse Navigator ?Truitt Merle, MD as Consulting Physician (Hematology and Oncology) ?Leighton Ruff, MD as Consulting Physician (General Surgery) ? ?Date of Service:  05/07/2021 ? ?CHIEF COMPLAINT: f/u of colon cancer ? ?CURRENT THERAPY:  ?Keytruda, started 09/21/20. Held 12/25/20-02/09/21 ? ?ASSESSMENT & PLAN:  ?Briana Ochoa is a 81 y.o. female with  ? ?1. Colon cancer in Splenic Flexure, pT3N0M0, Stage II, MSI-High ?-she was diagnosed on 04/23/20 by Colonoscopy  ?-resection with Dr Marcello Moores on 05/28/20 showed 9.7cm mass in splenic flexure with invasive adenocarcinoma, poorly differentiated, negative margins, negative LNs.  ?-GuardantReveal on 07/07/20 was positive, which likely indicates microscopic disease and high risk of recurrence. ?-PET scan was recommended but denied by her insurance. She proceeded with CT CAP w/o contrast on 08/31/20 showing no evidence of active malignancy.  ?-She began Xeloda and Keytruda on 09/21/20. She has tolerated treatment well overall. However, Xeloda held due to increased Cr and then low EGFR from 10/28-02/09/21, and Beryle Flock was held due to taste change/mouth numbness from 12/25/20-02/09/21. Xeloda completed 04/2021. ?-PET scan 03/17/21 showed: mild FDG uptake to the LUL nodule, which has increased in size since 04/2020; no evidence of metastatic disease; hypermetabolic subcentimeter nodule of  right parotid gland.  ?-she proceeded to bronchoscopy and biopsy on 04/06/21 under Dr. Valeta Harms. Cytology was negative for malignant cells.  ?-We will continue Keytruda, plan for total one year. Lab reviewed, adequate proceed with Keytruda today.  ?  ?2. Symptom Management: taste change/mouth numbness, mouth sore ?-she has experienced taste changes and mouth burning without ulceration. ?-symptoms persisted despite holding Xeloda/Keytruda and a course of steroids ?-she reports she had a brain MRI with Dr. Kathyrn Sheriff, who told her it appeared normal. (The images are available in PACS, dated 01/19/21) ?-she reports continued lip sore and mouth-coating feeling. Hopefully her B12 injections will improve her symptoms. ?  ?3. H/o DVT 04/2020 ?-she was found to have an elevated D-dimer back on 04/08/20. She was found to have DVT in the RLE. This was still present on 04/28/20 and she was started on Eliquis shortly after. ?-she has switched to baby aspirin. ?  ?4. H/o of Left breast DCIS, H/o Bladder cancer, H/o Melanoma in 2017 of right lower leg ?-Diagnosed with DCIS in 2015, S/p left lumpectomy, Adjuvant Radiation and 5 years of Anastrozole. Treated by Dr Lindi Adie. ?-Diagnosed with bladder cancer in 02/2018, early stage, S/p surgery. Followed by Dr. Alyson Ingles, next cystoscopy scheduled for 05/03/21. ?-most recent mammogram from 02/11/21 was negative. ?  ?5. Genetic Testing ?-Given her personal history of cancer and family history of cancer, I recommend genetic testing.  ?-performed 07/07/20, results negative ?  ?6. Comorbidites: CKD, DM, Heart murmur, H/o mini stroke from Brain aneurysm, HTN, Osteopenia ?-Continue medications and to f/u with other physicians for management.  ?  ?7. B12 deficiency ?-Recent lab work for her taste change ?-Started B12 injection last week, will continue weekly for 8 weeks, then changed to monthly ? ?  ?PLAN:  ?-proceed with Keytruda and B12 injection today ?-  continue B12 injection weekly x5 then monthly ?-Lab,  follow-up and Keytruda in 3 and 6 weeks ? ? ?No problem-specific Assessment & Plan notes found for this encounter. ? ? ?SUMMARY OF ONCOLOGIC HISTORY: ?Oncology History Overview Note  ?Cancer Staging ?Cancer of left colon ([place]) ?Staging form: Colon and Rectum, AJCC 8th Edition ?- Pathologic stage from 05/28/2020: Stage IIA (pT3, pN0, cM0) - Signed by Truitt Merle, MD on 06/19/2020 ?Stage prefix: Initial diagnosis ?Total positive nodes: 0 ?Histologic grading system: 4 grade system ?Histologic grade (G): G3 ?Residual tumor (R): R0 - None ? ?Ductal carcinoma in situ (DCIS) of left breast ?Staging form: Breast, AJCC 7th Edition ?- Clinical: Stage 0 (Tis (DCIS), N0, cM0) - Unsigned ?Specimen type: Core Needle Biopsy ?Histopathologic type: 9932 ?Laterality: Left ?Staging comments: Staged at breast conference 7.22.15 ? ?- Pathologic: Stage 0 (Tis (DCIS)(2), N0, cM0) - Unsigned ?Specimen type: Core Needle Biopsy ?Histopathologic type: 9932 ?Laterality: Left ?Tumor size (mm): 3 ?Multiple tumors: Yes ?Number of tumors: 2 ?Method of lymph node assessment: Clinical ?Method of detection of distant metastases: Clinical ?Residual tumor (R): R0 - None ?Estrogen receptor status: Positive ?Progesterone receptor status: Positive ? ?  ?Ductal carcinoma in situ (DCIS) of left breast  ?08/20/2013 Initial Diagnosis  ? Left breast 12:00: DCIS with calcifications, yet 100%, PR 100%; 09/04/2013 second group of calcifications also biopsied proven to be DCIS ER/PR positive ?  ?09/19/2013 Surgery  ? Left breast lumpectomy DCIS 2 foci margins negative, 0.2 and 0.3 cm ER 100% PR 100% ?  ?10/08/2013 - 11/12/2013 Radiation Therapy  ? Adjuvant radiation therapy ?  ?11/12/2013 - 2020 Anti-estrogen oral therapy  ? Anastrozole 1 mg daily plan is for 5 years ?  ?01/09/2014 - 01/10/2014 Hospital Admission  ? 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  ?07/30/2020 Genetic Testing  ? Negative genetic testing:  No pathogenic variants detected on the [employer]  CancerNext-Expanded + RNAinsight panel. The report date is 07/30/2020.  ? ?The CancerNext-Expanded + RNAinsight gene panel offered by [employer] and includes sequencing and rearrangement analysis for the following 77 genes: AIP, ALK, APC, ATM, AXIN2, BAP1, BARD1, BLM, BMPR1A, BRCA1, BRCA2, BRIP1, CDC73, CDH1, CDK4, CDKN1B, CDKN2A, CHEK2, CTNNA1, DICER1, FANCC, FH, FLCN, GALNT12, KIF1B, LZTR1, MAX, MEN1, MET, MLH1, MSH2, MSH3, MSH6, MUTYH, NBN, NF1, NF2, NTHL1, PALB2, PHOX2B, PMS2, POT1, PRKAR1A, PTCH1, PTEN, RAD51C, RAD51D, RB1, RECQL, RET, SDHA, SDHAF2, SDHB, SDHC, SDHD, SMAD4, SMARCA4, SMARCB1, SMARCE1, STK11, SUFU, TMEM127, TP53, TSC1, TSC2, VHL and XRCC2 (sequencing and deletion/duplication); EGFR, EGLN1, HOXB13, KIT, MITF, PDGFRA, POLD1 and POLE (sequencing only); EPCAM and GREM1 (deletion/duplication only). RNA data is routinely analyzed for use in variant interpretation for all genes. ?  ?Bladder cancer ([place])  ?02/09/2018 Initial Diagnosis  ? Bladder cancer [facility]) ?  ?07/30/2020 Genetic Testing  ? Negative genetic testing:  No pathogenic variants detected on the [facility] CancerNext-Expanded + RNAinsight panel. The report date is 07/30/2020.  ? ?The CancerNext-Expanded + RNAinsight gene panel offered by [employer] and includes sequencing and rearrangement analysis for the following 77 genes: AIP, ALK, APC, ATM, AXIN2, BAP1, BARD1, BLM, BMPR1A, BRCA1, BRCA2, BRIP1, CDC73, CDH1, CDK4, CDKN1B, CDKN2A, CHEK2, CTNNA1, DICER1, FANCC, FH, FLCN, GALNT12, KIF1B, LZTR1, MAX, MEN1, MET, MLH1, MSH2, MSH3, MSH6, MUTYH, NBN, NF1, NF2, NTHL1, PALB2, PHOX2B, PMS2, POT1, PRKAR1A, PTCH1, PTEN, RAD51C, RAD51D, RB1, RECQL, RET, SDHA, SDHAF2, SDHB, SDHC, SDHD, SMAD4, SMARCA4, SMARCB1, SMARCE1, STK11, SUFU, TMEM127, TP53, TSC1, TSC2, VHL and XRCC2 (sequencing and deletion/duplication); EGFR, EGLN1, HOXB13, KIT, MITF, PDGFRA, POLD1  and POLE (sequencing only); EPCAM and GREM1 (deletion/duplication only). RNA data is routinely analyzed  for use in variant interpretation for all genes. ?  ?08/31/2020 Imaging  ? CT CAP w/o contrast ? ?IMPRESSION: ?1. No specific findings of active malignancy on today's noncontrast ?CT. There has been interval p

## 2021-05-07 NOTE — Patient Instructions (Signed)
[facility]  Discharge Instructions: ?Thank you for choosing [facility] to provide your oncology and hematology care.  ? ?If you have a lab appointment with the Cancer Center, please go directly to the Cancer Center and check in at the registration area. ?  ?Wear comfortable clothing and clothing appropriate for easy access to any Portacath or PICC line.  ? ?We strive to give you quality time with your provider. You may need to reschedule your appointment if you arrive late (15 or more minutes).  Arriving late affects you and other patients whose appointments are after yours.  Also, if you miss three or more appointments without notifying the office, you may be dismissed from the clinic at the provider?s discretion.    ?  ?For prescription refill requests, have your pharmacy contact our office and allow 72 hours for refills to be completed.   ? ?Today you received the following chemotherapy and/or immunotherapy agents: Keytruda ?  ?To help prevent nausea and vomiting after your treatment, we encourage you to take your nausea medication as directed. ? ?BELOW ARE SYMPTOMS THAT SHOULD BE REPORTED IMMEDIATELY: ?*FEVER GREATER THAN 100.4 F (38 ?C) OR HIGHER ?*CHILLS OR SWEATING ?*NAUSEA AND VOMITING THAT IS NOT CONTROLLED WITH YOUR NAUSEA MEDICATION ?*UNUSUAL SHORTNESS OF BREATH ?*UNUSUAL BRUISING OR BLEEDING ?*URINARY PROBLEMS (pain or burning when urinating, or frequent urination) ?*BOWEL PROBLEMS (unusual diarrhea, constipation, pain near the anus) ?TENDERNESS IN MOUTH AND THROAT WITH OR WITHOUT PRESENCE OF ULCERS (sore throat, sores in mouth, or a toothache) ?UNUSUAL RASH, SWELLING OR PAIN  ?UNUSUAL VAGINAL DISCHARGE OR ITCHING  ? ?Items with * indicate a potential emergency and should be followed up as soon as possible or go to the Emergency Department if any problems should occur. ? ?Please show the CHEMOTHERAPY ALERT CARD or IMMUNOTHERAPY ALERT CARD at check-in to the  Emergency Department and triage nurse. ? ?Should you have questions after your visit or need to cancel or reschedule your appointment, please contact [facility]  Dept: [phone]  and follow the prompts.  Office hours are 8:00 a.m. to 4:30 p.m. Monday - Friday. Please note that voicemails left after 4:00 p.m. may not be returned until the following business day.  We are closed weekends and major holidays. You have access to a nurse at all times for urgent questions. Please call the main number to the clinic Dept: [phone] and follow the prompts. ? ? ?For any non-urgent questions, you may also contact your provider using MyChart. We now offer e-Visits for anyone 18 and older to request care online for non-urgent symptoms. For details visit [email or web address]. ?  ?Also download the MyChart app! Go to the app store, search "MyChart", open the app, select , and log in with your MyChart username and password. ? ?Due to Covid, a mask is required upon entering the hospital/clinic. If you do not have a mask, one will be given to you upon arrival. For doctor visits, patients may have 1 support person aged 18 or older with them. For treatment visits, patients cannot have anyone with them due to current Covid guidelines and our immunocompromised population.  ? ?

## 2021-05-08 ENCOUNTER — Encounter: Payer: Self-pay | Admitting: Hematology

## 2021-05-11 ENCOUNTER — Telehealth: Payer: Self-pay | Admitting: Hematology

## 2021-05-11 NOTE — Telephone Encounter (Signed)
Left message with follow-up appointment per 3/31 los. ?

## 2021-05-14 ENCOUNTER — Other Ambulatory Visit: Payer: Self-pay

## 2021-05-14 ENCOUNTER — Inpatient Hospital Stay: Payer: Medicare PPO | Attending: Hematology

## 2021-05-14 DIAGNOSIS — Z5112 Encounter for antineoplastic immunotherapy: Secondary | ICD-10-CM | POA: Insufficient documentation

## 2021-05-14 DIAGNOSIS — E538 Deficiency of other specified B group vitamins: Secondary | ICD-10-CM | POA: Diagnosis not present

## 2021-05-14 DIAGNOSIS — C186 Malignant neoplasm of descending colon: Secondary | ICD-10-CM | POA: Insufficient documentation

## 2021-05-14 MED ORDER — CYANOCOBALAMIN 1000 MCG/ML IJ SOLN
1000.0000 ug | Freq: Once | INTRAMUSCULAR | Status: AC
  Administered 2021-05-14: 1000 ug via INTRAMUSCULAR
  Filled 2021-05-14: qty 1

## 2021-05-14 NOTE — Patient Instructions (Signed)
Vitamin B12 Deficiency ?Vitamin B12 deficiency occurs when the body does not have enough vitamin B12, which is an important vitamin. The body needs this vitamin: ?To make red blood cells. ?To make DNA. This is the genetic material inside cells. ?To help the nerves work properly so they can carry messages from the brain to the body. ?Vitamin B12 deficiency can cause various health problems, such as a low red blood cell count (anemia) or nerve damage. ?What are the causes? ?This condition may be caused by: ?Not eating enough foods that contain vitamin B12. ?Not having enough stomach acid and digestive fluids to properly absorb vitamin B12 from the food that you eat. ?Certain digestive system diseases that make it hard to absorb vitamin B12. These diseases include Crohn's disease, chronic pancreatitis, and cystic fibrosis. ?A condition in which the body does not make enough of a protein (intrinsic factor), resulting in too few red blood cells (pernicious anemia). ?Having a surgery in which part of the stomach or small intestine is removed. ?Taking certain medicines that make it hard for the body to absorb vitamin B12. These medicines include: ?Heartburn medicines (antacids and proton pump inhibitors). ?Certain antibiotic medicines. ?Some medicines that are used to treat diabetes, tuberculosis, gout, or high cholesterol. ?What increases the risk? ?The following factors may make you more likely to develop a B12 deficiency: ?Being older than age 50. ?Eating a vegetarian or vegan diet, especially while you are pregnant. ?Eating a poor diet while you are pregnant. ?Taking certain medicines. ?Having alcoholism. ?What are the signs or symptoms? ?In some cases, there are no symptoms of this condition. If the condition leads to anemia or nerve damage, various symptoms can occur, such as: ?Weakness. ?Fatigue. ?Loss of appetite. ?Weight loss. ?Numbness or tingling in your hands and feet. ?Redness and burning of the  tongue. ?Confusion or memory problems. ?Depression. ?Sensory problems, such as color blindness, ringing in the ears, or loss of taste. ?Diarrhea or constipation. ?Trouble walking. ?If anemia is severe, symptoms can include: ?Shortness of breath. ?Dizziness. ?Rapid heart rate (tachycardia). ?How is this diagnosed? ?This condition may be diagnosed with a blood test to measure the level of vitamin B12 in your blood. You may also have other tests, including: ?A group of tests that measure certain characteristics of blood cells (complete blood count, CBC). ?A blood test to measure intrinsic factor. ?A procedure where a thin tube with a camera on the end is used to look into your stomach or intestines (endoscopy). ?Other tests may be needed to discover the cause of B12 deficiency. ?How is this treated? ?Treatment for this condition depends on the cause. This condition may be treated by: ?Changing your eating and drinking habits, such as: ?Eating more foods that contain vitamin B12. ?Drinking less alcohol or no alcohol. ?Getting vitamin B12 injections. ?Taking vitamin B12 supplements. Your health care provider will tell you which dosage is best for you. ?Follow these instructions at home: ?Eating and drinking ? ?Eat lots of healthy foods that contain vitamin B12, including: ?Meats and poultry. This includes beef, pork, chicken, turkey, and organ meats, such as liver. ?Seafood. This includes clams, rainbow trout, salmon, tuna, and haddock. ?Eggs. ?Cereal and dairy products that are fortified. This means that vitamin B12 has been added to the food. Check the label on the package to see if the food is fortified. ?The items listed above may not be a complete list of recommended foods and beverages. Contact a dietitian for more information. ?General instructions ?Get any   injections that are prescribed by your health care provider. ?Take supplements only as told by your health care provider. Follow the directions carefully. ?Do  not drink alcohol if your health care provider tells you not to. In some cases, you may only be asked to limit alcohol use. ?Keep all follow-up visits as told by your health care provider. This is important. ?Contact a health care provider if: ?Your symptoms come back. ?Get help right away if you: ?Develop shortness of breath. ?Have a rapid heart rate. ?Have chest pain. ?Become dizzy or lose consciousness. ?Summary ?Vitamin B12 deficiency occurs when the body does not have enough vitamin B12. ?The main causes of vitamin B12 deficiency include dietary deficiency, digestive diseases, pernicious anemia, and having a surgery in which part of the stomach or small intestine is removed. ?In some cases, there are no symptoms of this condition. If the condition leads to anemia or nerve damage, various symptoms can occur, such as weakness, shortness of breath, and numbness. ?Treatment may include getting vitamin B12 injections or taking vitamin B12 supplements. Eat lots of healthy foods that contain vitamin B12. ?This information is not intended to replace advice given to you by your health care provider. Make sure you discuss any questions you have with your health care provider. ?Document Revised: 07/22/2020 Document Reviewed: 10/03/2017 ?Elsevier Patient Education ? 2022 Elsevier Inc. ? ?

## 2021-05-17 ENCOUNTER — Encounter (HOSPITAL_COMMUNITY): Payer: Self-pay | Admitting: Pulmonary Disease

## 2021-05-20 LAB — ACID FAST CULTURE WITH REFLEXED SENSITIVITIES (MYCOBACTERIA)
Acid Fast Culture: NEGATIVE
Acid Fast Culture: NEGATIVE

## 2021-05-21 ENCOUNTER — Other Ambulatory Visit: Payer: Self-pay

## 2021-05-21 ENCOUNTER — Inpatient Hospital Stay: Payer: Medicare PPO

## 2021-05-21 DIAGNOSIS — C186 Malignant neoplasm of descending colon: Secondary | ICD-10-CM | POA: Diagnosis not present

## 2021-05-21 DIAGNOSIS — E538 Deficiency of other specified B group vitamins: Secondary | ICD-10-CM | POA: Diagnosis not present

## 2021-05-21 DIAGNOSIS — Z5112 Encounter for antineoplastic immunotherapy: Secondary | ICD-10-CM | POA: Diagnosis not present

## 2021-05-21 MED ORDER — CYANOCOBALAMIN 1000 MCG/ML IJ SOLN
1000.0000 ug | Freq: Once | INTRAMUSCULAR | Status: AC
  Administered 2021-05-21: 1000 ug via INTRAMUSCULAR
  Filled 2021-05-21: qty 1

## 2021-05-23 ENCOUNTER — Other Ambulatory Visit: Payer: Self-pay | Admitting: Hematology

## 2021-05-25 NOTE — Progress Notes (Signed)
?[facility]   ?Telephone:([phone] [phone]   ?Clinic Follow up Note  ? ?Patient Care Team: ?Nche, [name], NP as PCP - General (Internal Medicine) ?Fanny Skates, MD as Consulting Physician (General Surgery) ?Thea Silversmith, MD as Consulting Physician (Radiation Oncology) ?Nicholas Lose, MD as Consulting Physician (Hematology and Oncology) ?Garvin Fila, MD as Consulting Physician (Neurology) ?Consuella Lose, MD as Consulting Physician (Neurosurgery) ?Katy Apo, MD as Consulting Physician (Ophthalmology) ?Jonnie Finner, RN (Inactive) as Oncology Nurse Navigator ?Truitt Merle, MD as Consulting Physician (Hematology and Oncology) ?Leighton Ruff, MD as Consulting Physician (General Surgery) ?05/27/2021 ? ?CHIEF COMPLAINT: Follow up colon cancer  ? ?SUMMARY OF ONCOLOGIC HISTORY: ?Oncology History Overview Note  ?Cancer Staging ?Cancer of left colon ([place]) ?Staging form: Colon and Rectum, AJCC 8th Edition ?- Pathologic stage from 05/28/2020: Stage IIA (pT3, pN0, cM0) - Signed by Truitt Merle, MD on 06/19/2020 ?Stage prefix: Initial diagnosis ?Total positive nodes: 0 ?Histologic grading system: 4 grade system ?Histologic grade (G): G3 ?Residual tumor (R): R0 - None ? ?Ductal carcinoma in situ (DCIS) of left breast ?Staging form: Breast, AJCC 7th Edition ?- Clinical: Stage 0 (Tis (DCIS), N0, cM0) - Unsigned ?Specimen type: Core Needle Biopsy ?Histopathologic type: 9932 ?Laterality: Left ?Staging comments: Staged at breast conference 7.22.15 ? ?- Pathologic: Stage 0 (Tis (DCIS)(2), N0, cM0) - Unsigned ?Specimen type: Core Needle Biopsy ?Histopathologic type: 9932 ?Laterality: Left ?Tumor size (mm): 3 ?Multiple tumors: Yes ?Number of tumors: 2 ?Method of lymph node assessment: Clinical ?Method of detection of distant metastases: Clinical ?Residual tumor (R): R0 - None ?Estrogen receptor status: Positive ?Progesterone receptor status: Positive ? ?  ?Ductal carcinoma in situ  (DCIS) of left breast  ?08/20/2013 Initial Diagnosis  ? Left breast 12:00: DCIS with calcifications, yet 100%, PR 100%; 09/04/2013 second group of calcifications also biopsied proven to be DCIS ER/PR positive ? ?  ?09/19/2013 Surgery  ? Left breast lumpectomy DCIS 2 foci margins negative, 0.2 and 0.3 cm ER 100% PR 100% ? ?  ?10/08/2013 - 11/12/2013 Radiation Therapy  ? Adjuvant radiation therapy ? ?  ?11/12/2013 - 2020 Anti-estrogen oral therapy  ? Anastrozole 1 mg daily plan is for 5 years ? ?  ?01/09/2014 - 01/10/2014 Hospital Admission  ? 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 ?  ?07/30/2020 Genetic Testing  ? Negative genetic testing:  No pathogenic variants detected on the [employer] CancerNext-Expanded + RNAinsight panel. The report date is 07/30/2020.  ? ?The CancerNext-Expanded + RNAinsight gene panel offered by [employer] and includes sequencing and rearrangement analysis for the following 77 genes: AIP, ALK, APC, ATM, AXIN2, BAP1, BARD1, BLM, BMPR1A, BRCA1, BRCA2, BRIP1, CDC73, CDH1, CDK4, CDKN1B, CDKN2A, CHEK2, CTNNA1, DICER1, FANCC, FH, FLCN, GALNT12, KIF1B, LZTR1, MAX, MEN1, MET, MLH1, MSH2, MSH3, MSH6, MUTYH, NBN, NF1, NF2, NTHL1, PALB2, PHOX2B, PMS2, POT1, PRKAR1A, PTCH1, PTEN, RAD51C, RAD51D, RB1, RECQL, RET, SDHA, SDHAF2, SDHB, SDHC, SDHD, SMAD4, SMARCA4, SMARCB1, SMARCE1, STK11, SUFU, TMEM127, TP53, TSC1, TSC2, VHL and XRCC2 (sequencing and deletion/duplication); EGFR, EGLN1, HOXB13, KIT, MITF, PDGFRA, POLD1 and POLE (sequencing only); EPCAM and GREM1 (deletion/duplication only). RNA data is routinely analyzed for use in variant interpretation for all genes. ?  ?Bladder cancer ([place])  ?02/09/2018 Initial Diagnosis  ? Bladder cancer ([place]) ? ?  ?07/30/2020 Genetic Testing  ? Negative genetic testing:  No pathogenic variants detected on the [employer] CancerNext-Expanded + RNAinsight panel. The report date is 07/30/2020.  ? ?The CancerNext-Expanded + RNAinsight gene panel offered by [employer]  and includes  sequencing and rearrangement analysis for the following 77 genes: AIP, ALK, APC, ATM, AXIN2, BAP1, BARD1, BLM, BMPR1A, BRCA1, BRCA2, BRIP1, CDC73, CDH1, CDK4, CDKN1B, CDKN2A, CHEK2, CTNNA1, DICER1, FANCC, FH, FLCN, GALNT12, KIF1B, LZTR1, MAX, MEN1, MET, MLH1, MSH2, MSH3, MSH6, MUTYH, NBN, NF1, NF2, NTHL1, PALB2, PHOX2B, PMS2, POT1, PRKAR1A, PTCH1, PTEN, RAD51C, RAD51D, RB1, RECQL, RET, SDHA, SDHAF2, SDHB, SDHC, SDHD, SMAD4, SMARCA4, SMARCB1, SMARCE1, STK11, SUFU, TMEM127, TP53, TSC1, TSC2, VHL and XRCC2 (sequencing and deletion/duplication); EGFR, EGLN1, HOXB13, KIT, MITF, PDGFRA, POLD1 and POLE (sequencing only); EPCAM and GREM1 (deletion/duplication only). RNA data is routinely analyzed for use in variant interpretation for all genes. ?  ?08/31/2020 Imaging  ? CT CAP w/o contrast ? ?IMPRESSION: ?1. No specific findings of active malignancy on today's noncontrast ?CT. There has been interval partial colectomy involving the tumor ?site with reanastomosis. Some faint stranding in the adjacent ?omentum is probably from scarring or fat necrosis, less likely due ?to early tumor given the lack of overt nodularity. Surveillance ?imaging is likely indicated, and PET-CT may have a role in imaging ?follow up. ?2. Several small pulmonary nodules, in addition to a larger mixed ?density nodule in the left upper lobe. Surveillance is recommended. ?3. Other imaging findings of potential clinical significance: Aortic ?Atherosclerosis (ICD10-I70.0). Coronary atherosclerosis. Mitral ?valve calcification. Small type 1 hiatal hernia. Splenectomy with ?small amount of regenerative splenic tissue as well as a venous ?varix arising from the splenic vein. Hyperdense left kidney lower ?pole exophytic homogeneous lesion, probably a complex cyst. Sigmoid colon diverticulosis. Multilevel lumbar spondylosis and degenerative disc disease. ?  ?Cancer of left colon ([place])  ?04/23/2020 Procedure  ? Upper Endoscopy and Colonoscopy by Dr  Carlean Purl ?IMPRESSION ?Erythematous mucosa in the antrum. Biopsied. ?- Small hiatal hernia. ?- Gastroesophageal flap valve classified as Hill Grade IV (no fold, wide open lumen, hiatal ?hernia present). ?- The examination was otherwise normal. ? ? ?IMPRESSION ?- Decreased sphincter tone found on digital rectal exam. ?- Malignant partially obstructing tumor in the proximal descending colon. Biopsied. Tattooed. ?- Three 5 to 20 mm polyps in the sigmoid colon, removed piecemeal using a cold snare. ?Resected and retrieved. ?- Severe diverticulosis in the sigmoid colon. ?- I left several diminutive descending (proximal to tattoo), sigmoid and rectal polyps ?- The examination was otherwise normal on direct and retroflexion views. ? ? ?  ?04/23/2020 Initial Diagnosis  ? Diagnosis ?1. Descending Colon Polyp, mass ?- ADENOCARCINOMA. ?2. Sigmoid Colon Polyp, (3) ?- MULTIPLE FRAGMENTS OF TUBULAR ADENOMA. ?- MULTIPLE FRAGMENTS OF SESSILE SERRATED POLYP WITHOUT DYSPLASIA. ?- NO HIGH GRADE DYSPLASIA OR MALIGNANCY. ?Microscopic Comment ?1. Dr. Vic Ripper has reviewed the case. Dr. Carlean Purl was notified on 04/24/2020. ?2. Despite being left sided the fragments have features of a sessile serrated polyp, as opposed to, hyperplastic polyp. ?  ?04/27/2020 Imaging  ? CT AP ?IMPRESSION: ?Bulky annular soft tissue mass involving the distal transverse colon ?with extension into adjacent pericolonic fat, consistent with ?primary colon carcinoma. ?  ?No evidence of metastatic disease. ?  ?Colonic diverticulosis. No radiographic evidence of diverticulitis. ?  ?Aortic Atherosclerosis (ICD10-I70.0). ?  ?  ?05/05/2020 Imaging  ? CT Chest ?IMPRESSION: ?Bulky annular soft tissue mass involving the distal transverse colon ?with extension into adjacent pericolonic fat, consistent with ?primary colon carcinoma. ?  ?No evidence of metastatic disease. ?  ?Colonic diverticulosis. No radiographic evidence of diverticulitis. ?  ?Aortic Atherosclerosis  (ICD10-I70.0). ?  ?  ?05/28/2020 Initial Diagnosis  ? Cancer of left colon ([place]) ? ?  ?  05/28/2020 Cancer Staging  ? Staging form: Colon and Rectum, AJCC 8th Edition ?- Pathologic stage from 05/28/2020: Stage IIA (pT3, pN0, cM0) - S

## 2021-05-27 ENCOUNTER — Inpatient Hospital Stay: Payer: Medicare PPO

## 2021-05-27 ENCOUNTER — Other Ambulatory Visit: Payer: Self-pay

## 2021-05-27 ENCOUNTER — Encounter: Payer: Self-pay | Admitting: Nurse Practitioner

## 2021-05-27 ENCOUNTER — Inpatient Hospital Stay: Payer: Medicare PPO | Admitting: Nurse Practitioner

## 2021-05-27 VITALS — BP 134/83 | HR 66 | Temp 97.7°F | Resp 17 | Wt 173.3 lb

## 2021-05-27 DIAGNOSIS — Z5112 Encounter for antineoplastic immunotherapy: Secondary | ICD-10-CM | POA: Diagnosis not present

## 2021-05-27 DIAGNOSIS — C186 Malignant neoplasm of descending colon: Secondary | ICD-10-CM

## 2021-05-27 DIAGNOSIS — E538 Deficiency of other specified B group vitamins: Secondary | ICD-10-CM

## 2021-05-27 LAB — CMP (CANCER CENTER ONLY)
ALT: 7 U/L (ref 0–44)
AST: 13 U/L — ABNORMAL LOW (ref 15–41)
Albumin: 3.8 g/dL (ref 3.5–5.0)
Alkaline Phosphatase: 82 U/L (ref 38–126)
Anion gap: 5 (ref 5–15)
BUN: 32 mg/dL — ABNORMAL HIGH (ref 8–23)
CO2: 26 mmol/L (ref 22–32)
Calcium: 9.3 mg/dL (ref 8.9–10.3)
Chloride: 108 mmol/L (ref 98–111)
Creatinine: 1.62 mg/dL — ABNORMAL HIGH (ref 0.44–1.00)
GFR, Estimated: 32 mL/min — ABNORMAL LOW (ref >60)
Glucose, Bld: 105 mg/dL — ABNORMAL HIGH (ref 70–99)
Potassium: 5 mmol/L (ref 3.5–5.1)
Sodium: 139 mmol/L (ref 135–145)
Total Bilirubin: 0.4 mg/dL (ref 0.3–1.2)
Total Protein: 6.6 g/dL (ref 6.5–8.1)

## 2021-05-27 LAB — CBC WITH DIFFERENTIAL (CANCER CENTER ONLY)
Abs Immature Granulocytes: 0.02 10*3/uL (ref 0.00–0.07)
Basophils Absolute: 0.1 10*3/uL (ref 0.0–0.1)
Basophils Relative: 2 %
Eosinophils Absolute: 0.5 10*3/uL (ref 0.0–0.5)
Eosinophils Relative: 6 %
HCT: 43.8 % (ref 36.0–46.0)
Hemoglobin: 14.6 g/dL (ref 12.0–15.0)
Immature Granulocytes: 0 %
Lymphocytes Relative: 18 %
Lymphs Abs: 1.6 10*3/uL (ref 0.7–4.0)
MCH: 34.8 pg — ABNORMAL HIGH (ref 26.0–34.0)
MCHC: 33.3 g/dL (ref 30.0–36.0)
MCV: 104.3 fL — ABNORMAL HIGH (ref 80.0–100.0)
Monocytes Absolute: 1.2 10*3/uL — ABNORMAL HIGH (ref 0.1–1.0)
Monocytes Relative: 13 %
Neutro Abs: 5.5 10*3/uL (ref 1.7–7.7)
Neutrophils Relative %: 61 %
Platelet Count: 275 10*3/uL (ref 150–400)
RBC: 4.2 MIL/uL (ref 3.87–5.11)
RDW: 17.7 % — ABNORMAL HIGH (ref 11.5–15.5)
WBC Count: 8.9 10*3/uL (ref 4.0–10.5)
nRBC: 0 % (ref 0.0–0.2)

## 2021-05-27 MED ORDER — CYANOCOBALAMIN 1000 MCG/ML IJ SOLN
1000.0000 ug | Freq: Once | INTRAMUSCULAR | Status: AC
  Administered 2021-05-27: 1000 ug via INTRAMUSCULAR
  Filled 2021-05-27: qty 1

## 2021-05-27 MED ORDER — MAGIC MOUTHWASH W/LIDOCAINE: 5.0000 mL | Freq: Four times a day (QID) | ORAL | 1 refills | Status: DC | PRN

## 2021-05-27 MED ORDER — SODIUM CHLORIDE 0.9 % IV SOLN
200.0000 mg | Freq: Once | INTRAVENOUS | Status: AC
  Administered 2021-05-27: 200 mg via INTRAVENOUS
  Filled 2021-05-27: qty 200

## 2021-05-27 MED ORDER — SODIUM CHLORIDE 0.9 % IV SOLN: Freq: Once | INTRAVENOUS | Status: AC

## 2021-05-27 NOTE — Progress Notes (Signed)
Orders placed for magic mouthwash to [facility], Per Cira Rue, NP information faxed to pharmacy and confirmed.  ?

## 2021-05-27 NOTE — Patient Instructions (Signed)
[facility]COL[facility]  Discharge Instructions: ?Thank you for choosing [facility] to provide your oncology and hematology care.  ? ?If you have a lab appointment with the Cancer Center, please go directly to the Cancer Center and check in at the registration area. ?  ?Wear comfortable clothing and clothing appropriate for easy access to any Portacath or PICC line.  ? ?We strive to give you quality time with your provider. You may need to reschedule your appointment if you arrive late (15 or more minutes).  Arriving late affects you and other patients whose appointments are after yours.  Also, if you miss three or more appointments without notifying the office, you may be dismissed from the clinic at the provider?s discretion.    ?  ?For prescription refill requests, have your pharmacy contact our office and allow 72 hours for refills to be completed.   ? ?Today you received the following chemotherapy and/or immunotherapy agents: Keytruda ?  ?To help prevent nausea and vomiting after your treatment, we encourage you to take your nausea medication as directed. ? ?BELOW ARE SYMPTOMS THAT SHOULD BE REPORTED IMMEDIATELY: ?*FEVER GREATER THAN 100.4 F (38 ?C) OR HIGHER ?*CHILLS OR SWEATING ?*NAUSEA AND VOMITING THAT IS NOT CONTROLLED WITH YOUR NAUSEA MEDICATION ?*UNUSUAL SHORTNESS OF BREATH ?*UNUSUAL BRUISING OR BLEEDING ?*URINARY PROBLEMS (pain or burning when urinating, or frequent urination) ?*BOWEL PROBLEMS (unusual diarrhea, constipation, pain near the anus) ?TENDERNESS IN MOUTH AND THROAT WITH OR WITHOUT PRESENCE OF ULCERS (sore throat, sores in mouth, or a toothache) ?UNUSUAL RASH, SWELLING OR PAIN  ?UNUSUAL VAGINAL DISCHARGE OR ITCHING  ? ?Items with * indicate a potential emergency and should be followed up as soon as possible or go to the Emergency Department if any problems should occur. ? ?Please show the CHEMOTHERAPY ALERT CARD or IMMUNOTHERAPY ALERT CARD at check-in to the  Emergency Department and triage nurse. ? ?Should you have questions after your visit or need to cancel or reschedule your appointment, please contact [facility]  Dept: [phone]  and follow the prompts.  Office hours are 8:00 a.m. to 4:30 p.m. Monday - Friday. Please note that voicemails left after 4:00 p.m. may not be returned until the following business day.  We are closed weekends and major holidays. You have access to a nurse at all times for urgent questions. Please call the main number to the clinic Dept: [phone] and follow the prompts. ? ? ?For any non-urgent questions, you may also contact your provider using MyChart. We now offer e-Visits for anyone 18 and older to request care online for non-urgent symptoms. For details visit [email or web address]. ?  ?Also download the MyChart app! Go to the app store, search "MyChart", open the app, select [facility], and log in with your MyChart username and password. ? ?Due to Covid, a mask is required upon entering the hospital/clinic. If you do not have a mask, one will be given to you upon arrival. For doctor visits, patients may have 1 support person aged 18 or older with them. For treatment visits, patients cannot have anyone with them due to current Covid guidelines and our immunocompromised population.  ? ?

## 2021-05-28 ENCOUNTER — Ambulatory Visit: Payer: Medicare PPO

## 2021-05-31 ENCOUNTER — Other Ambulatory Visit: Payer: Self-pay

## 2021-05-31 ENCOUNTER — Other Ambulatory Visit: Payer: Self-pay | Admitting: Hematology

## 2021-06-01 ENCOUNTER — Ambulatory Visit (INDEPENDENT_AMBULATORY_CARE_PROVIDER_SITE_OTHER): Payer: Medicare PPO | Admitting: Nurse Practitioner

## 2021-06-01 ENCOUNTER — Encounter: Payer: Self-pay | Admitting: Nurse Practitioner

## 2021-06-01 VITALS — BP 124/70 | HR 70 | Temp 97.8°F | Ht 64.0 in | Wt 173.6 lb

## 2021-06-01 DIAGNOSIS — I10 Essential (primary) hypertension: Secondary | ICD-10-CM | POA: Diagnosis not present

## 2021-06-01 DIAGNOSIS — E785 Hyperlipidemia, unspecified: Secondary | ICD-10-CM

## 2021-06-01 DIAGNOSIS — E1169 Type 2 diabetes mellitus with other specified complication: Secondary | ICD-10-CM

## 2021-06-01 DIAGNOSIS — E782 Mixed hyperlipidemia: Secondary | ICD-10-CM

## 2021-06-01 LAB — MICROALBUMIN / CREATININE URINE RATIO
Creatinine,U: 262.9 mg/dL
Microalb Creat Ratio: 3.5 mg/g (ref 0.0–30.0)
Microalb, Ur: 9.2 mg/dL — ABNORMAL HIGH (ref 0.0–1.9)

## 2021-06-01 LAB — LIPID PANEL
Cholesterol: 182 mg/dL (ref 0–200)
HDL: 71.6 mg/dL (ref >39.00)
LDL Cholesterol: 92 mg/dL (ref 0–99)
NonHDL: 110.65
Total CHOL/HDL Ratio: 3
Triglycerides: 95 mg/dL (ref 0.0–149.0)
VLDL: 19 mg/dL (ref 0.0–40.0)

## 2021-06-01 LAB — HEMOGLOBIN A1C: Hgb A1c MFr Bld: 6 % (ref 4.6–6.5)

## 2021-06-01 NOTE — Assessment & Plan Note (Addendum)
Home glucose 100-114 ?Normal foot exam except hypertrophic toenails. She declined referral to podiatry at this time. ? ?Check hgbA1c: 6.0%, ? positive urine microalbumin: continue lisinopril dose ?No med additional needed at this time. ?

## 2021-06-01 NOTE — Progress Notes (Signed)
? ?             Established Patient Visit ? ?Patient: Briana Ochoa   DOB: 1940-07-06   81 y.o. Female  MRN: [id number] ?Visit Date: 06/02/2021 ? ?Subjective:  ?  ?Chief Complaint  ?Patient presents with  ? Follow-up  ?  6 month f/u on Cholesterol and pre-diabetes  ?Pt encouraged to get shingles vaccine from local pharmacy due to insurance.   ? ?HPI ?Type 2 diabetes mellitus with hyperlipidemia ([place]) ?Repeat lipid panel: LDL at goal ?Maintain atorvastatin dose ? ?HTN (hypertension) ?BP at goal with lisinopril ?BP Readings from Last 3 Encounters:  ?06/01/21 124/70  ?05/27/21 134/83  ?05/07/21 (!) 127/58  ? ?Stable renal function per CMP completed 05/2021 ?Maintain med dose ?F/up in 12month ? ?Diabetes [place] ([place] ?Home glucose 100-114 ?Normal foot exam except hypertrophic toenails. She declined referral to podiatry at this time. ? ?Check hgbA1c: 6.0%, ? positive urine microalbumin: continue lisinopril dose ?No med additional needed at this time. ? ?Reviewed medical, surgical, and social history today ? ?Medications: ?Outpatient Medications Prior to Visit  ?Medication Sig  ? acetaminophen (TYLENOL) 500 MG tablet Take 1,000 mg by mouth daily.  ? glucose blood (TRUE METRIX BLOOD GLUCOSE TEST) test strip Use as instructed to test blood sugar once daily E08.638  ? magic mouthwash w/lidocaine SOLN Take 5 mLs by mouth every 6 (six) hours as needed for mouth pain. Swish and swallow  ? [DISCONTINUED] lisinopril (ZESTRIL) 20 MG tablet TAKE 1 TABLET EVERY DAY  ? [DISCONTINUED] pravastatin (PRAVACHOL) 10 MG tablet TAKE 1 TABLET EVERY DAY (STOP PRAVASTATIN '20MG'$ )  ? [DISCONTINUED] predniSONE (STERAPRED UNI-PAK 21 TAB) 10 MG (21) TBPK tablet Take 4 tabs daily for 2 days then drip one tab every 2 days until finish.  ? [DISCONTINUED] ondansetron (ZOFRAN) 8 MG tablet Take 1 tablet (8 mg total) by mouth every 8 (eight) hours as needed for nausea or vomiting. (Patient not taking: Reported on 06/01/2021)  ? ?No facility-administered  medications prior to visit.  ? ?Reviewed past medical and social history.  ? ?ROS per HPI above ? ? ?   ?Objective:  ?BP 124/70 (BP Location: Left Arm, Patient Position: Sitting, Cuff Size: Large)   Pulse 70   Temp 97.8 ?F (36.6 ?C) (Temporal)   Ht '5\' 4"'$  (1.626 m)   Wt 173 lb 9.6 oz (78.7 kg)   SpO2 95%   BMI 29.80 kg/m?  ? ?  ? ?Physical Exam ?Cardiovascular:  ?   Rate and Rhythm: Normal rate and regular rhythm.  ?   Pulses: Normal pulses.  ?   Heart sounds: Normal heart sounds.  ?Pulmonary:  ?   Effort: Pulmonary effort is normal.  ?   Breath sounds: Normal breath sounds.  ?Musculoskeletal:  ?   Right lower leg: No edema.  ?   Left lower leg: No edema.  ?Neurological:  ?   Mental Status: She is alert and oriented to person, place, and time.  ?Psychiatric:     ?   Mood and Affect: Mood normal.     ?   Behavior: Behavior normal.     ?   Thought Content: Thought content normal.  ?  ?Results for orders placed or performed in visit on 06/01/21  ?Microalbumin / creatinine urine ratio  ?Result Value Ref Range  ? Microalb, Ur 9.2 (H) 0.0 - 1.9 mg/dL  ? Creatinine,U 262.9 mg/dL  ? Microalb Creat Ratio 3.5 0.0 - 30.0 mg/g  ?Lipid panel  ?Result  Value Ref Range  ? Cholesterol 182 0 - 200 mg/dL  ? Triglycerides 95.0 0.0 - 149.0 mg/dL  ? HDL 71.60 >39.00 mg/dL  ? VLDL 19.0 0.0 - 40.0 mg/dL  ? LDL Cholesterol 92 0 - 99 mg/dL  ? Total CHOL/HDL Ratio 3   ? NonHDL 110.65   ?Hemoglobin A1c  ?Result Value Ref Range  ? Hgb A1c MFr Bld 6.0 4.6 - 6.5 %  ? ?   ?Assessment & Plan:  ?  ?Problem List Items Addressed This Visit   ? ?  ? Cardiovascular and Mediastinum  ? HTN (hypertension)  ?  BP at goal with lisinopril ?BP Readings from Last 3 Encounters:  ?06/01/21 124/70  ?05/27/21 134/83  ?05/07/21 (!) 127/58  ? ?Stable renal function per CMP completed 05/2021 ?Maintain med dose ?F/up in 18month ?  ?  ? Relevant Medications  ? lisinopril (ZESTRIL) 20 MG tablet  ? pravastatin (PRAVACHOL) 10 MG tablet  ?  ? Endocrine  ? Diabetes  mellitus ([place] [facility]  ?  Home glucose 100-114 ?Normal foot exam except hypertrophic toenails. She declined referral to podiatry at this time. ? ?Check hgbA1c: 6.0%, ? positive urine microalbumin: continue lisinopril dose ?No med additional needed at this time. ? ?  ?  ? Relevant Medications  ? lisinopril (ZESTRIL) 20 MG tablet  ? pravastatin (PRAVACHOL) 10 MG tablet  ? Other Relevant Orders  ? Hemoglobin A1c (Completed)  ? Microalbumin / creatinine urine ratio (Completed)  ? Type 2 diabetes mellitus with hyperlipidemia [place]  ?  Repeat lipid panel: LDL at goal ?Maintain atorvastatin dose ? ?  ?  ? Relevant Medications  ? lisinopril (ZESTRIL) 20 MG tablet  ? pravastatin (PRAVACHOL) 10 MG tablet  ?  ? Other  ? HLD (hyperlipidemia)  ? Relevant Medications  ? lisinopril (ZESTRIL) 20 MG tablet  ? pravastatin (PRAVACHOL) 10 MG tablet  ? ?Return in about 6 months (around 12/01/2021) for DM and HTN, hyperlipidemia (fasting). ? ?  ? ?CWilfred Lacy NP ? ? ?

## 2021-06-01 NOTE — Assessment & Plan Note (Addendum)
Repeat lipid panel: LDL at goal ?Maintain atorvastatin dose ?

## 2021-06-01 NOTE — Patient Instructions (Signed)
Go to lab

## 2021-06-01 NOTE — Assessment & Plan Note (Signed)
BP at goal with lisinopril ?BP Readings from Last 3 Encounters:  ?06/01/21 124/70  ?05/27/21 134/83  ?05/07/21 (!) 127/58  ? ?Stable renal function per CMP completed 05/2021 ?Maintain med dose ?F/up in 60month ?

## 2021-06-02 ENCOUNTER — Telehealth: Payer: Self-pay | Admitting: Hematology

## 2021-06-02 MED ORDER — PRAVASTATIN SODIUM 10 MG PO TABS: ORAL_TABLET | ORAL | 3 refills | Status: DC

## 2021-06-02 MED ORDER — LISINOPRIL 20 MG PO TABS: 20.0000 mg | ORAL_TABLET | Freq: Every day | ORAL | 3 refills | Status: DC

## 2021-06-02 NOTE — Telephone Encounter (Signed)
Left message with follow-up appointments per 4/20 los. ?

## 2021-06-09 ENCOUNTER — Encounter: Payer: Self-pay | Admitting: Urology

## 2021-06-09 ENCOUNTER — Ambulatory Visit: Payer: Medicare PPO | Admitting: Urology

## 2021-06-09 VITALS — BP 151/75 | HR 78

## 2021-06-09 DIAGNOSIS — Z8551 Personal history of malignant neoplasm of bladder: Secondary | ICD-10-CM | POA: Diagnosis not present

## 2021-06-09 DIAGNOSIS — C679 Malignant neoplasm of bladder, unspecified: Secondary | ICD-10-CM

## 2021-06-09 LAB — URINALYSIS, ROUTINE W REFLEX MICROSCOPIC
Bilirubin, UA: NEGATIVE
Glucose, UA: NEGATIVE
Nitrite, UA: NEGATIVE
Protein,UA: NEGATIVE
RBC, UA: NEGATIVE
Specific Gravity, UA: 1.025 (ref 1.005–1.030)
Urobilinogen, Ur: 0.2 mg/dL (ref 0.2–1.0)
pH, UA: 5.5 (ref 5.0–7.5)

## 2021-06-09 LAB — MICROSCOPIC EXAMINATION
RBC, Urine: NONE SEEN /hpf (ref 0–2)
Renal Epithel, UA: NONE SEEN /hpf

## 2021-06-09 MED ORDER — MONISTAT 7 COMPLETE THERAPY 100-2 MG-% VA KIT: 1.0000 | PACK | VAGINAL | 1 refills | Status: DC

## 2021-06-09 MED ORDER — CIPROFLOXACIN HCL 500 MG PO TABS
500.0000 mg | ORAL_TABLET | Freq: Once | ORAL | Status: AC
  Administered 2021-06-09: 500 mg via ORAL

## 2021-06-09 NOTE — Patient Instructions (Signed)

## 2021-06-09 NOTE — Progress Notes (Signed)
? ?  06/09/21 ? ?CC: followup bladder cancer ? ? ?HPI: ?Ms Mangham is a 81yo here for followp for T1G3 bladder cancer diagnosed in 04/2018 ?Blood pressure (!) 151/75, pulse 78. ?NED. A&Ox3.   ?No respiratory distress   ?Abd soft, NT, ND ?Normal external genitalia with patent urethral meatus ? ?Cystoscopy Procedure Note ? ?Patient identification was confirmed, informed consent was obtained, and patient was prepped using Betadine solution.  Lidocaine jelly was administered per urethral meatus.   ? ?Procedure: ?- Flexible cystoscope introduced, without any difficulty.   ?- Thorough search of the bladder revealed: ?   normal urethral meatus ?   normal urothelium ?   no stones ?   no ulcers  ?   no tumors ?   no urethral polyps ?   no trabeculation ? ?- Ureteral orifices were normal in position and appearance. ? ?Post-Procedure: ?- Patient tolerated the procedure well ? ?Assessment/ Plan: ?RTC 6 months for cystoscopy ? ? No follow-ups on file. ? ?Nicolette Bang, MD  ?

## 2021-06-10 LAB — CYTOLOGY, URINE

## 2021-06-18 ENCOUNTER — Other Ambulatory Visit: Payer: Self-pay

## 2021-06-18 ENCOUNTER — Inpatient Hospital Stay: Payer: Medicare PPO

## 2021-06-18 ENCOUNTER — Encounter: Payer: Self-pay | Admitting: Hematology

## 2021-06-18 ENCOUNTER — Inpatient Hospital Stay: Payer: Medicare PPO | Attending: Hematology

## 2021-06-18 ENCOUNTER — Inpatient Hospital Stay: Payer: Medicare PPO | Admitting: Hematology

## 2021-06-18 VITALS — BP 153/75 | HR 68 | Temp 98.5°F | Resp 18 | Ht 64.0 in | Wt 176.7 lb

## 2021-06-18 DIAGNOSIS — C185 Malignant neoplasm of splenic flexure: Secondary | ICD-10-CM | POA: Diagnosis not present

## 2021-06-18 DIAGNOSIS — E538 Deficiency of other specified B group vitamins: Secondary | ICD-10-CM | POA: Insufficient documentation

## 2021-06-18 DIAGNOSIS — C186 Malignant neoplasm of descending colon: Secondary | ICD-10-CM | POA: Insufficient documentation

## 2021-06-18 DIAGNOSIS — Z5112 Encounter for antineoplastic immunotherapy: Secondary | ICD-10-CM | POA: Insufficient documentation

## 2021-06-18 LAB — CBC WITH DIFFERENTIAL (CANCER CENTER ONLY)
Abs Immature Granulocytes: 0.04 10*3/uL (ref 0.00–0.07)
Basophils Absolute: 0.1 10*3/uL (ref 0.0–0.1)
Basophils Relative: 1 %
Eosinophils Absolute: 0.6 10*3/uL — ABNORMAL HIGH (ref 0.0–0.5)
Eosinophils Relative: 6 %
HCT: 44.8 % (ref 36.0–46.0)
Hemoglobin: 15.2 g/dL — ABNORMAL HIGH (ref 12.0–15.0)
Immature Granulocytes: 0 %
Lymphocytes Relative: 16 %
Lymphs Abs: 1.6 10*3/uL (ref 0.7–4.0)
MCH: 34.8 pg — ABNORMAL HIGH (ref 26.0–34.0)
MCHC: 33.9 g/dL (ref 30.0–36.0)
MCV: 102.5 fL — ABNORMAL HIGH (ref 80.0–100.0)
Monocytes Absolute: 1.2 10*3/uL — ABNORMAL HIGH (ref 0.1–1.0)
Monocytes Relative: 12 %
Neutro Abs: 6.4 10*3/uL (ref 1.7–7.7)
Neutrophils Relative %: 65 %
Platelet Count: 288 10*3/uL (ref 150–400)
RBC: 4.37 MIL/uL (ref 3.87–5.11)
RDW: 14.6 % (ref 11.5–15.5)
WBC Count: 9.8 10*3/uL (ref 4.0–10.5)
nRBC: 0 % (ref 0.0–0.2)

## 2021-06-18 LAB — CMP (CANCER CENTER ONLY)
ALT: 7 U/L (ref 0–44)
AST: 12 U/L — ABNORMAL LOW (ref 15–41)
Albumin: 4 g/dL (ref 3.5–5.0)
Alkaline Phosphatase: 76 U/L (ref 38–126)
Anion gap: 6 (ref 5–15)
BUN: 30 mg/dL — ABNORMAL HIGH (ref 8–23)
CO2: 26 mmol/L (ref 22–32)
Calcium: 9.4 mg/dL (ref 8.9–10.3)
Chloride: 106 mmol/L (ref 98–111)
Creatinine: 1.46 mg/dL — ABNORMAL HIGH (ref 0.44–1.00)
GFR, Estimated: 36 mL/min — ABNORMAL LOW (ref >60)
Glucose, Bld: 108 mg/dL — ABNORMAL HIGH (ref 70–99)
Potassium: 5.1 mmol/L (ref 3.5–5.1)
Sodium: 138 mmol/L (ref 135–145)
Total Bilirubin: 0.3 mg/dL (ref 0.3–1.2)
Total Protein: 7.1 g/dL (ref 6.5–8.1)

## 2021-06-18 MED ORDER — CYANOCOBALAMIN 1000 MCG/ML IJ SOLN
1000.0000 ug | Freq: Once | INTRAMUSCULAR | Status: AC
  Administered 2021-06-18: 1000 ug via INTRAMUSCULAR
  Filled 2021-06-18: qty 1

## 2021-06-18 MED ORDER — SODIUM CHLORIDE 0.9 % IV SOLN: Freq: Once | INTRAVENOUS | Status: AC

## 2021-06-18 MED ORDER — SODIUM CHLORIDE 0.9 % IV SOLN
200.0000 mg | Freq: Once | INTRAVENOUS | Status: AC
  Administered 2021-06-18: 200 mg via INTRAVENOUS
  Filled 2021-06-18: qty 200
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
## 2021-06-18 NOTE — Patient Instructions (Signed)
[facility]  Discharge Instructions: ?Thank you for choosing [facility] to provide your oncology and hematology care.  ? ?If you have a lab appointment with the Cancer Center, please go directly to the Cancer Center and check in at the registration area. ?  ?Wear comfortable clothing and clothing appropriate for easy access to any Portacath or PICC line.  ? ?We strive to give you quality time with your provider. You may need to reschedule your appointment if you arrive late (15 or more minutes).  Arriving late affects you and other patients whose appointments are after yours.  Also, if you miss three or more appointments without notifying the office, you may be dismissed from the clinic at the provider?s discretion.    ?  ?For prescription refill requests, have your pharmacy contact our office and allow 72 hours for refills to be completed.   ? ?Today you received the following chemotherapy and/or immunotherapy agents: Keytruda ?  ?To help prevent nausea and vomiting after your treatment, we encourage you to take your nausea medication as directed. ? ?BELOW ARE SYMPTOMS THAT SHOULD BE REPORTED IMMEDIATELY: ?*FEVER GREATER THAN 100.4 F (38 ?C) OR HIGHER ?*CHILLS OR SWEATING ?*NAUSEA AND VOMITING THAT IS NOT CONTROLLED WITH YOUR NAUSEA MEDICATION ?*UNUSUAL SHORTNESS OF BREATH ?*UNUSUAL BRUISING OR BLEEDING ?*URINARY PROBLEMS (pain or burning when urinating, or frequent urination) ?*BOWEL PROBLEMS (unusual diarrhea, constipation, pain near the anus) ?TENDERNESS IN MOUTH AND THROAT WITH OR WITHOUT PRESENCE OF ULCERS (sore throat, sores in mouth, or a toothache) ?UNUSUAL RASH, SWELLING OR PAIN  ?UNUSUAL VAGINAL DISCHARGE OR ITCHING  ? ?Items with * indicate a potential emergency and should be followed up as soon as possible or go to the Emergency Department if any problems should occur. ? ?Please show the CHEMOTHERAPY ALERT CARD or IMMUNOTHERAPY ALERT CARD at check-in to the  Emergency Department and triage nurse. ? ?Should you have questions after your visit or need to cancel or reschedule your appointment, please contact [facility]  Dept: [phone]  and follow the prompts.  Office hours are 8:00 a.m. to 4:30 p.m. Monday - Friday. Please note that voicemails left after 4:00 p.m. may not be returned until the following business day.  We are closed weekends and major holidays. You have access to a nurse at all times for urgent questions. Please call the main number to the clinic Dept: [phone] and follow the prompts. ? ? ?For any non-urgent questions, you may also contact your provider using MyChart. We now offer e-Visits for anyone 18 and older to request care online for non-urgent symptoms. For details visit [email or web address]. ?  ?Also download the MyChart app! Go to the app store, search "MyChart", open the app, select [facility], and log in with your MyChart username and password. ? ?Due to Covid, a mask is required upon entering the hospital/clinic. If you do not have a mask, one will be given to you upon arrival. For doctor visits, patients may have 1 support person aged 18 or older with them. For treatment visits, patients cannot have anyone with them due to current Covid guidelines and our immunocompromised population.  ? ?

## 2021-06-21 ENCOUNTER — Telehealth: Payer: Self-pay | Admitting: Nurse Practitioner

## 2021-06-21 NOTE — Telephone Encounter (Signed)
Left message for patient to call back and schedule Medicare Annual Wellness Visit (AWV).   Please offer to do virtually or by telephone.  Left office number and my jabber #[phone].  Last AWV:01/02/2017  Please schedule at anytime with Nurse Health Advisor.   

## 2021-06-25 ENCOUNTER — Ambulatory Visit: Payer: Medicare PPO

## 2021-06-28 ENCOUNTER — Encounter: Payer: Self-pay | Admitting: Internal Medicine

## 2021-07-09 ENCOUNTER — Ambulatory Visit: Payer: Medicare PPO | Admitting: Physician Assistant

## 2021-07-09 ENCOUNTER — Inpatient Hospital Stay: Payer: Medicare PPO

## 2021-07-09 ENCOUNTER — Other Ambulatory Visit: Payer: Medicare PPO

## 2021-07-09 ENCOUNTER — Telehealth: Payer: Self-pay

## 2021-07-09 DIAGNOSIS — C186 Malignant neoplasm of descending colon: Secondary | ICD-10-CM

## 2021-07-09 NOTE — Telephone Encounter (Signed)
Spoke with pt regarding her appt today in infusion.  Pt stated she thought her appt was next week.  Informed that that it was today but this RN will send a scheduling message to have the appts for today rescheduled for next week.  Informed pt that someone from our Scheduling Team will be in contact with her to schedule her appt for her Pembrolizumab infusion.

## 2021-07-11 ENCOUNTER — Other Ambulatory Visit: Payer: Self-pay | Admitting: Hematology

## 2021-07-12 ENCOUNTER — Telehealth: Payer: Self-pay | Admitting: Nurse Practitioner

## 2021-07-12 NOTE — Telephone Encounter (Signed)
Left message for patient to call back and schedule Medicare Annual Wellness Visit (AWV).   Please offer to do virtually or by telephone.  Left office number and my jabber #[phone].  Last AWV:01/02/2017  Please schedule at anytime with Nurse Health Advisor.   

## 2021-07-13 ENCOUNTER — Telehealth: Payer: Self-pay | Admitting: Hematology

## 2021-07-13 NOTE — Telephone Encounter (Signed)
.  Called pt per 6/6 inbasket , Patient was unavailable, a message with appt time and date was left with number on file.

## 2021-07-16 ENCOUNTER — Inpatient Hospital Stay: Payer: Medicare PPO | Attending: Hematology

## 2021-07-16 ENCOUNTER — Inpatient Hospital Stay: Payer: Medicare PPO

## 2021-07-16 ENCOUNTER — Other Ambulatory Visit: Payer: Self-pay

## 2021-07-16 VITALS — BP 135/70 | HR 65 | Temp 97.8°F | Resp 18 | Wt 176.8 lb

## 2021-07-16 DIAGNOSIS — E538 Deficiency of other specified B group vitamins: Secondary | ICD-10-CM | POA: Diagnosis not present

## 2021-07-16 DIAGNOSIS — Z853 Personal history of malignant neoplasm of breast: Secondary | ICD-10-CM | POA: Diagnosis not present

## 2021-07-16 DIAGNOSIS — Z5112 Encounter for antineoplastic immunotherapy: Secondary | ICD-10-CM | POA: Insufficient documentation

## 2021-07-16 DIAGNOSIS — C185 Malignant neoplasm of splenic flexure: Secondary | ICD-10-CM | POA: Insufficient documentation

## 2021-07-16 DIAGNOSIS — Z79899 Other long term (current) drug therapy: Secondary | ICD-10-CM | POA: Diagnosis not present

## 2021-07-16 DIAGNOSIS — C186 Malignant neoplasm of descending colon: Secondary | ICD-10-CM

## 2021-07-16 LAB — CBC WITH DIFFERENTIAL (CANCER CENTER ONLY)
Abs Immature Granulocytes: 0.01 10*3/uL (ref 0.00–0.07)
Basophils Absolute: 0.1 10*3/uL (ref 0.0–0.1)
Basophils Relative: 1 %
Eosinophils Absolute: 0.5 10*3/uL (ref 0.0–0.5)
Eosinophils Relative: 6 %
HCT: 44.6 % (ref 36.0–46.0)
Hemoglobin: 14.9 g/dL (ref 12.0–15.0)
Immature Granulocytes: 0 %
Lymphocytes Relative: 17 %
Lymphs Abs: 1.4 10*3/uL (ref 0.7–4.0)
MCH: 34.2 pg — ABNORMAL HIGH (ref 26.0–34.0)
MCHC: 33.4 g/dL (ref 30.0–36.0)
MCV: 102.3 fL — ABNORMAL HIGH (ref 80.0–100.0)
Monocytes Absolute: 0.9 10*3/uL (ref 0.1–1.0)
Monocytes Relative: 11 %
Neutro Abs: 5.3 10*3/uL (ref 1.7–7.7)
Neutrophils Relative %: 65 %
Platelet Count: 276 10*3/uL (ref 150–400)
RBC: 4.36 MIL/uL (ref 3.87–5.11)
RDW: 13 % (ref 11.5–15.5)
WBC Count: 8.2 10*3/uL (ref 4.0–10.5)
nRBC: 0 % (ref 0.0–0.2)

## 2021-07-16 LAB — CMP (CANCER CENTER ONLY)
ALT: 8 U/L (ref 0–44)
AST: 13 U/L — ABNORMAL LOW (ref 15–41)
Albumin: 3.9 g/dL (ref 3.5–5.0)
Alkaline Phosphatase: 73 U/L (ref 38–126)
Anion gap: 7 (ref 5–15)
BUN: 28 mg/dL — ABNORMAL HIGH (ref 8–23)
CO2: 25 mmol/L (ref 22–32)
Calcium: 9.6 mg/dL (ref 8.9–10.3)
Chloride: 108 mmol/L (ref 98–111)
Creatinine: 1.45 mg/dL — ABNORMAL HIGH (ref 0.44–1.00)
GFR, Estimated: 36 mL/min — ABNORMAL LOW (ref >60)
Glucose, Bld: 147 mg/dL — ABNORMAL HIGH (ref 70–99)
Potassium: 4.9 mmol/L (ref 3.5–5.1)
Sodium: 140 mmol/L (ref 135–145)
Total Bilirubin: 0.3 mg/dL (ref 0.3–1.2)
Total Protein: 6.6 g/dL (ref 6.5–8.1)

## 2021-07-16 LAB — TSH: TSH: 2.378 u[IU]/mL (ref 0.350–4.500)

## 2021-07-16 MED ORDER — SODIUM CHLORIDE 0.9 % IV SOLN
200.0000 mg | Freq: Once | INTRAVENOUS | Status: AC
  Administered 2021-07-16: 200 mg via INTRAVENOUS
  Filled 2021-07-16: qty 200

## 2021-07-16 MED ORDER — CYANOCOBALAMIN 1000 MCG/ML IJ SOLN
1000.0000 ug | Freq: Once | INTRAMUSCULAR | Status: AC
  Administered 2021-07-16: 1000 ug via INTRAMUSCULAR
  Filled 2021-07-16: qty 1

## 2021-07-16 MED ORDER — SODIUM CHLORIDE 0.9 % IV SOLN: Freq: Once | INTRAVENOUS | Status: AC

## 2021-07-16 NOTE — Patient Instructions (Signed)
[facility]  Discharge Instructions: ?Thank you for choosing [facility] to provide your oncology and hematology care.  ? ?If you have a lab appointment with the Cancer Center, please go directly to the Cancer Center and check in at the registration area. ?  ?Wear comfortable clothing and clothing appropriate for easy access to any Portacath or PICC line.  ? ?We strive to give you quality time with your provider. You may need to reschedule your appointment if you arrive late (15 or more minutes).  Arriving late affects you and other patients whose appointments are after yours.  Also, if you miss three or more appointments without notifying the office, you may be dismissed from the clinic at the provider?s discretion.    ?  ?For prescription refill requests, have your pharmacy contact our office and allow 72 hours for refills to be completed.   ? ?Today you received the following chemotherapy and/or immunotherapy agents: Keytruda ?  ?To help prevent nausea and vomiting after your treatment, we encourage you to take your nausea medication as directed. ? ?BELOW ARE SYMPTOMS THAT SHOULD BE REPORTED IMMEDIATELY: ?*FEVER GREATER THAN 100.4 F (38 ?C) OR HIGHER ?*CHILLS OR SWEATING ?*NAUSEA AND VOMITING THAT IS NOT CONTROLLED WITH YOUR NAUSEA MEDICATION ?*UNUSUAL SHORTNESS OF BREATH ?*UNUSUAL BRUISING OR BLEEDING ?*URINARY PROBLEMS (pain or burning when urinating, or frequent urination) ?*BOWEL PROBLEMS (unusual diarrhea, constipation, pain near the anus) ?TENDERNESS IN MOUTH AND THROAT WITH OR WITHOUT PRESENCE OF ULCERS (sore throat, sores in mouth, or a toothache) ?UNUSUAL RASH, SWELLING OR PAIN  ?UNUSUAL VAGINAL DISCHARGE OR ITCHING  ? ?Items with * indicate a potential emergency and should be followed up as soon as possible or go to the Emergency Department if any problems should occur. ? ?Please show the CHEMOTHERAPY ALERT CARD or IMMUNOTHERAPY ALERT CARD at check-in to the  Emergency Department and triage nurse. ? ?Should you have questions after your visit or need to cancel or reschedule your appointment, please contact [facility]  Dept: [phone]  and follow the prompts.  Office hours are 8:00 a.m. to 4:30 p.m. Monday - Friday. Please note that voicemails left after 4:00 p.m. may not be returned until the following business day.  We are closed weekends and major holidays. You have access to a nurse at all times for urgent questions. Please call the main number to the clinic Dept: [phone] and follow the prompts. ? ? ?For any non-urgent questions, you may also contact your provider using MyChart. We now offer e-Visits for anyone 18 and older to request care online for non-urgent symptoms. For details visit [email or web address]. ?  ?Also download the MyChart app! Go to the app store, search "MyChart", open the app, select [facility], and log in with your MyChart username and password. ? ?Due to Covid, a mask is required upon entering the hospital/clinic. If you do not have a mask, one will be given to you upon arrival. For doctor visits, patients may have 1 support person aged 18 or older with them. For treatment visits, patients cannot have anyone with them due to current Covid guidelines and our immunocompromised population.  ? ?

## 2021-07-23 ENCOUNTER — Other Ambulatory Visit: Payer: Self-pay

## 2021-07-23 ENCOUNTER — Inpatient Hospital Stay: Payer: Medicare PPO

## 2021-07-23 ENCOUNTER — Telehealth: Payer: Self-pay | Admitting: Hematology

## 2021-07-23 NOTE — Telephone Encounter (Signed)
Rescheduled upcoming appointment per 6/16 secure chat. Patient is aware of changes.

## 2021-07-23 NOTE — Telephone Encounter (Signed)
Left message with follow-up appointments per 6/16 schedule message.

## 2021-07-30 ENCOUNTER — Ambulatory Visit: Payer: Medicare PPO | Admitting: Hematology

## 2021-07-30 ENCOUNTER — Ambulatory Visit: Payer: Medicare PPO

## 2021-07-30 ENCOUNTER — Other Ambulatory Visit: Payer: Medicare PPO

## 2021-08-06 ENCOUNTER — Inpatient Hospital Stay: Payer: Medicare PPO

## 2021-08-06 ENCOUNTER — Inpatient Hospital Stay: Payer: Medicare PPO | Admitting: Physician Assistant

## 2021-08-06 ENCOUNTER — Other Ambulatory Visit: Payer: Self-pay

## 2021-08-06 VITALS — BP 133/64 | HR 69 | Temp 97.8°F | Resp 17 | Ht 64.0 in | Wt 179.9 lb

## 2021-08-06 DIAGNOSIS — Z853 Personal history of malignant neoplasm of breast: Secondary | ICD-10-CM | POA: Diagnosis not present

## 2021-08-06 DIAGNOSIS — C186 Malignant neoplasm of descending colon: Secondary | ICD-10-CM

## 2021-08-06 DIAGNOSIS — Z79899 Other long term (current) drug therapy: Secondary | ICD-10-CM | POA: Diagnosis not present

## 2021-08-06 DIAGNOSIS — C185 Malignant neoplasm of splenic flexure: Secondary | ICD-10-CM | POA: Diagnosis not present

## 2021-08-06 DIAGNOSIS — Z5112 Encounter for antineoplastic immunotherapy: Secondary | ICD-10-CM | POA: Diagnosis not present

## 2021-08-06 DIAGNOSIS — E538 Deficiency of other specified B group vitamins: Secondary | ICD-10-CM

## 2021-08-06 LAB — CMP (CANCER CENTER ONLY)
ALT: 8 U/L (ref 0–44)
AST: 13 U/L — ABNORMAL LOW (ref 15–41)
Albumin: 4.1 g/dL (ref 3.5–5.0)
Alkaline Phosphatase: 77 U/L (ref 38–126)
Anion gap: 6 (ref 5–15)
BUN: 27 mg/dL — ABNORMAL HIGH (ref 8–23)
CO2: 26 mmol/L (ref 22–32)
Calcium: 9.7 mg/dL (ref 8.9–10.3)
Chloride: 108 mmol/L (ref 98–111)
Creatinine: 1.49 mg/dL — ABNORMAL HIGH (ref 0.44–1.00)
GFR, Estimated: 35 mL/min — ABNORMAL LOW (ref >60)
Glucose, Bld: 134 mg/dL — ABNORMAL HIGH (ref 70–99)
Potassium: 4.8 mmol/L (ref 3.5–5.1)
Sodium: 140 mmol/L (ref 135–145)
Total Bilirubin: 0.3 mg/dL (ref 0.3–1.2)
Total Protein: 7.1 g/dL (ref 6.5–8.1)

## 2021-08-06 LAB — CBC WITH DIFFERENTIAL (CANCER CENTER ONLY)
Abs Immature Granulocytes: 0.03 10*3/uL (ref 0.00–0.07)
Basophils Absolute: 0.1 10*3/uL (ref 0.0–0.1)
Basophils Relative: 1 %
Eosinophils Absolute: 0.6 10*3/uL — ABNORMAL HIGH (ref 0.0–0.5)
Eosinophils Relative: 7 %
HCT: 46.1 % — ABNORMAL HIGH (ref 36.0–46.0)
Hemoglobin: 15.5 g/dL — ABNORMAL HIGH (ref 12.0–15.0)
Immature Granulocytes: 0 %
Lymphocytes Relative: 16 %
Lymphs Abs: 1.5 10*3/uL (ref 0.7–4.0)
MCH: 33.3 pg (ref 26.0–34.0)
MCHC: 33.6 g/dL (ref 30.0–36.0)
MCV: 99.1 fL (ref 80.0–100.0)
Monocytes Absolute: 1 10*3/uL (ref 0.1–1.0)
Monocytes Relative: 10 %
Neutro Abs: 6.1 10*3/uL (ref 1.7–7.7)
Neutrophils Relative %: 66 %
Platelet Count: 282 10*3/uL (ref 150–400)
RBC: 4.65 MIL/uL (ref 3.87–5.11)
RDW: 12.6 % (ref 11.5–15.5)
WBC Count: 9.2 10*3/uL (ref 4.0–10.5)
nRBC: 0 % (ref 0.0–0.2)

## 2021-08-06 MED ORDER — CYANOCOBALAMIN 1000 MCG/ML IJ SOLN
1000.0000 ug | Freq: Once | INTRAMUSCULAR | Status: AC
  Administered 2021-08-06: 1000 ug via INTRAMUSCULAR
  Filled 2021-08-06: qty 1

## 2021-08-06 MED ORDER — SODIUM CHLORIDE 0.9 % IV SOLN
200.0000 mg | Freq: Once | INTRAVENOUS | Status: AC
  Administered 2021-08-06: 200 mg via INTRAVENOUS
  Filled 2021-08-06: qty 200

## 2021-08-06 MED ORDER — SODIUM CHLORIDE 0.9 % IV SOLN: Freq: Once | INTRAVENOUS | Status: AC

## 2021-08-09 ENCOUNTER — Telehealth: Payer: Self-pay | Admitting: Nurse Practitioner

## 2021-08-09 ENCOUNTER — Encounter: Payer: Self-pay | Admitting: Hematology

## 2021-08-09 NOTE — Telephone Encounter (Signed)
Left message for patient to call back and schedule Medicare Annual Wellness Visit (AWV).   Please offer to do virtually or by telephone.  Left office number and my jabber [phone].  Last AWV:01/02/2017  Please schedule at anytime with Nurse Health Advisor.

## 2021-08-09 NOTE — Progress Notes (Signed)
[place]   [phone] Fax:([phone]   Clinic Follow up Note   Patient Care Team: Nche, [name], NP as PCP - General (Internal Medicine) Fanny Skates, MD as Consulting Physician (General Surgery) Thea Silversmith, MD as Consulting Physician (Radiation Oncology) Nicholas Lose, MD as Consulting Physician (Hematology and Oncology) Garvin Fila, MD as Consulting Physician (Neurology) Consuella Lose, MD as Consulting Physician (Neurosurgery) Katy Apo, MD as Consulting Physician (Ophthalmology) Jonnie Finner, RN (Inactive) as Oncology Nurse Navigator Truitt Merle, MD as Consulting Physician (Hematology and Oncology) Leighton Ruff, MD as Consulting Physician (General Surgery)  Date of Service:  08/06/2021  CHIEF COMPLAINT: f/u of colon cancer  CURRENT THERAPY:  -Keytruda, started 09/21/20. Held 12/25/20-02/09/21 -B12 injections, started 04/09/21, currently monthly  SUMMARY OF ONCOLOGIC HISTORY: Oncology History Overview Note  Cancer Staging Cancer of left colon [facility]) Staging form: Colon and Rectum, AJCC 8th Edition - Pathologic stage from 05/28/2020: Stage IIA (pT3, pN0, cM0) - Signed by Truitt Merle, MD on 06/19/2020 Stage prefix: Initial diagnosis Total positive nodes: 0 Histologic grading system: 4 grade system Histologic grade (G): G3 Residual tumor (R): R0 - None  Ductal carcinoma in situ (DCIS) of left breast Staging form: Breast, AJCC 7th Edition - Clinical: Stage 0 (Tis (DCIS), N0, cM0) - Unsigned Specimen type: Core Needle Biopsy Histopathologic type: 9932 Laterality: Left Staging comments: Staged at breast conference 7.22.15  - Pathologic: Stage 0 (Tis (DCIS)(2), N0, cM0) - Unsigned Specimen type: Core Needle Biopsy Histopathologic type: 9932 Laterality: Left Tumor size (mm): 3 Multiple tumors: Yes Number of tumors: 2 Method of lymph node assessment: Clinical Method of detection of distant metastases:  Clinical Residual tumor (R): R0 - None Estrogen receptor status: Positive Progesterone receptor status: Positive    Ductal carcinoma in situ (DCIS) of left breast  08/20/2013 Initial Diagnosis   Left breast 12:00: DCIS with calcifications, yet 100%, PR 100%; 09/04/2013 second group of calcifications also biopsied proven to be DCIS ER/PR positive   09/19/2013 Surgery   Left breast lumpectomy DCIS 2 foci margins negative, 0.2 and 0.3 cm ER 100% PR 100%   10/08/2013 - 11/12/2013 Radiation Therapy   Adjuvant radiation therapy   11/12/2013 - 2020 Anti-estrogen oral therapy   Anastrozole 1 mg daily plan is for 5 years   01/09/2014 - 01/10/2014 Hospital Admission   Pipeline embolization 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 The report date is 07/30/2020.   The CancerNext-Expanded + RNAinsight gene panel offered by [employer] and includes sequencing and rearrangement analysis for the following 77 genes: AIP, ALK, APC, ATM, AXIN2, BAP1, BARD1, BLM, BMPR1A, BRCA1, BRCA2, BRIP1, CDC73, CDH1, CDK4, CDKN1B, CDKN2A, CHEK2, CTNNA1, DICER1, FANCC, FH, FLCN, GALNT12, KIF1B, LZTR1, MAX, MEN1, MET, MLH1, MSH2, MSH3, MSH6, MUTYH, NBN, NF1, NF2, NTHL1, PALB2, PHOX2B, PMS2, POT1, PRKAR1A, PTCH1, PTEN, RAD51C, RAD51D, RB1, RECQL, RET, SDHA, SDHAF2, SDHB, SDHC, SDHD, SMAD4, SMARCA4, SMARCB1, SMARCE1, STK11, SUFU, TMEM127, TP53, TSC1, TSC2, VHL and XRCC2 (sequencing and deletion/duplication); EGFR, EGLN1, HOXB13, KIT, MITF, PDGFRA, POLD1 and POLE (sequencing only); EPCAM and GREM1 (deletion/duplication only). RNA data is routinely analyzed for use in variant interpretation for all genes.   Bladder cancer ([place])  02/09/2018 Initial Diagnosis   Bladder cancer ([place])   07/30/2020 Genetic Testing   Negative genetic testing:  No pathogenic variants detected on the [employer] CancerNext-Expanded +  RNAinsight panel. The report date  is 07/30/2020.   The CancerNext-Expanded + RNAinsight gene panel offered by [employer] and includes sequencing and rearrangement analysis for the following 77 genes: AIP, ALK, APC, ATM, AXIN2, BAP1, BARD1, BLM, BMPR1A, BRCA1, BRCA2, BRIP1, CDC73, CDH1, CDK4, CDKN1B, CDKN2A, CHEK2, CTNNA1, DICER1, FANCC, FH, FLCN, GALNT12, KIF1B, LZTR1, MAX, MEN1, MET, MLH1, MSH2, MSH3, MSH6, MUTYH, NBN, NF1, NF2, NTHL1, PALB2, PHOX2B, PMS2, POT1, PRKAR1A, PTCH1, PTEN, RAD51C, RAD51D, RB1, RECQL, RET, SDHA, SDHAF2, SDHB, SDHC, SDHD, SMAD4, SMARCA4, SMARCB1, SMARCE1, STK11, SUFU, TMEM127, TP53, TSC1, TSC2, VHL and XRCC2 (sequencing and deletion/duplication); EGFR, EGLN1, HOXB13, KIT, MITF, PDGFRA, POLD1 and POLE (sequencing only); EPCAM and GREM1 (deletion/duplication only). RNA data is routinely analyzed for use in variant interpretation for all genes.   08/31/2020 Imaging   CT CAP w/o contrast  IMPRESSION: 1. No specific findings of active malignancy on today's noncontrast CT. There has been interval partial colectomy involving the tumor site with reanastomosis. Some faint stranding in the adjacent omentum is probably from scarring or fat necrosis, less likely due to early tumor given the lack of overt nodularity. Surveillance imaging is likely indicated, and PET-CT may have a role in imaging follow up. 2. Several small pulmonary nodules, in addition to a larger mixed density nodule in the left upper lobe. Surveillance is recommended. 3. Other imaging findings of potential clinical significance: Aortic Atherosclerosis (ICD10-I70.0). Coronary atherosclerosis. Mitral valve calcification. Small type 1 hiatal hernia. Splenectomy with small amount of regenerative splenic tissue as well as a venous varix arising from the splenic vein. Hyperdense left kidney lower pole exophytic homogeneous lesion, probably a complex cyst. Sigmoid colon diverticulosis. Multilevel lumbar spondylosis  and degenerative disc disease.   Cancer of left colon ([place])  04/23/2020 Procedure   Upper Endoscopy and Colonoscopy by Dr Carlean Purl IMPRESSION Erythematous mucosa in the antrum. Biopsied. - Small hiatal hernia. - Gastroesophageal flap valve classified as Hill Grade IV (no fold, wide open lumen, hiatal hernia present). - The examination was otherwise normal.   IMPRESSION - Decreased sphincter tone found on digital rectal exam. - Malignant partially obstructing tumor in the proximal descending colon. Biopsied. Tattooed. - Three 5 to 20 mm polyps in the sigmoid colon, removed piecemeal using a cold snare. Resected and retrieved. - Severe diverticulosis in the sigmoid colon. - I left several diminutive descending (proximal to tattoo), sigmoid and rectal polyps - The examination was otherwise normal on direct and retroflexion views.     04/23/2020 Initial Diagnosis   Diagnosis 1. Descending Colon Polyp, mass - ADENOCARCINOMA. 2. Sigmoid Colon Polyp, (3) - MULTIPLE FRAGMENTS OF TUBULAR ADENOMA. - MULTIPLE FRAGMENTS OF SESSILE SERRATED POLYP WITHOUT DYSPLASIA. - NO HIGH GRADE DYSPLASIA OR MALIGNANCY. Microscopic Comment 1. Dr. Vic Ripper has reviewed the case. Dr. Carlean Purl was notified on 04/24/2020. 2. Despite being left sided the fragments have features of a sessile serrated polyp, as opposed to, hyperplastic polyp.   04/27/2020 Imaging   CT AP IMPRESSION: Bulky annular soft tissue mass involving the distal transverse colon with extension into adjacent pericolonic fat, consistent with primary colon carcinoma.   No evidence of metastatic disease.   Colonic diverticulosis. No radiographic evidence of diverticulitis.   Aortic Atherosclerosis (ICD10-I70.0).     05/05/2020 Imaging   CT Chest IMPRESSION: Bulky annular soft tissue mass involving the distal transverse colon with extension into adjacent pericolonic fat, consistent with primary colon carcinoma.   No evidence of  metastatic disease.   Colonic diverticulosis. No radiographic evidence of diverticulitis.   Aortic Atherosclerosis (ICD10-I70.0).  05/28/2020 Initial Diagnosis   Cancer of left colon ([place])   05/28/2020 Cancer Staging   Staging form: Colon and Rectum, AJCC 8th Edition - Pathologic stage from 05/28/2020: Stage IIA (pT3, pN0, cM0) - Signed by Truitt Merle, MD on 06/19/2020 Stage prefix: Initial diagnosis Total positive nodes: 0 Histologic grading system: 4 grade system Histologic grade (G): G3 Residual tumor (R): R0 - None   05/28/2020 Surgery   XI ROBOT ASSISTED RESECTION OF SPLENIC FLEXURE by Dr Marcello Moores    FINAL MICROSCOPIC DIAGNOSIS:   A. COLON, SPLENIC FLEXURE, RESECTION:  - Invasive adenocarcinoma, poorly differentiated, spanning 9.7 cm.  - Tumor invades through muscularis propria into pericolonic tissue.  - Resection margins are negative.  - Tubular adenoma (X1).  - Sessile serrate polyp without dysplasia (x2).  - Diverticulosis.  - Polypectomy scar.  - Twenty-four of twenty-four lymph nodes negative for carcinoma (0/24).  - See oncology table.    07/07/2020 Miscellaneous   Guardant Reveal ctDNA positive MSI high   07/30/2020 Genetic Testing   Negative genetic testing:  No pathogenic variants detected on the [employer] CancerNext-Expanded + RNAinsight panel. The report date is 07/30/2020.   The CancerNext-Expanded + RNAinsight gene panel offered by [employer] and includes sequencing and rearrangement analysis for the following 77 genes: AIP, ALK, APC, ATM, AXIN2, BAP1, BARD1, BLM, BMPR1A, BRCA1, BRCA2, BRIP1, CDC73, CDH1, CDK4, CDKN1B, CDKN2A, CHEK2, CTNNA1, DICER1, FANCC, FH, FLCN, GALNT12, KIF1B, LZTR1, MAX, MEN1, MET, MLH1, MSH2, MSH3, MSH6, MUTYH, NBN, NF1, NF2, NTHL1, PALB2, PHOX2B, PMS2, POT1, PRKAR1A, PTCH1, PTEN, RAD51C, RAD51D, RB1, RECQL, RET, SDHA, SDHAF2, SDHB, SDHC, SDHD, SMAD4, SMARCA4, SMARCB1, SMARCE1, STK11, SUFU, TMEM127, TP53, TSC1, TSC2, VHL and XRCC2  (sequencing and deletion/duplication); EGFR, EGLN1, HOXB13, KIT, MITF, PDGFRA, POLD1 and POLE (sequencing only); EPCAM and GREM1 (deletion/duplication only). RNA data is routinely analyzed for use in variant interpretation for all genes.   08/31/2020 Imaging   CT CAP w/o contrast  IMPRESSION: 1. No specific findings of active malignancy on today's noncontrast CT. There has been interval partial colectomy involving the tumor site with reanastomosis. Some faint stranding in the adjacent omentum is probably from scarring or fat necrosis, less likely due to early tumor given the lack of overt nodularity. Surveillance imaging is likely indicated, and PET-CT may have a role in imaging follow up. 2. Several small pulmonary nodules, in addition to a larger mixed density nodule in the left upper lobe. Surveillance is recommended. 3. Other imaging findings of potential clinical significance: Aortic Atherosclerosis (ICD10-I70.0). Coronary atherosclerosis. Mitral valve calcification. Small type 1 hiatal hernia. Splenectomy with small amount of regenerative splenic tissue as well as a venous varix arising from the splenic vein. Hyperdense left kidney lower pole exophytic homogeneous lesion, probably a complex cyst. Sigmoid colon diverticulosis. Multilevel lumbar spondylosis and degenerative disc disease.   10/02/2020 -  Chemotherapy   Patient is on Treatment Plan : COLORECTAL Pembrolizumab q21d     03/01/2021 Imaging   EXAM: CT CHEST, ABDOMEN AND PELVIS WITHOUT CONTRAST  IMPRESSION: 1. Mixed attenuation lesion posterior left upper lobe has areas of more confluent and enlarging nodularity today. Interval progression raises concern for neoplasm. Consider PET-CT to further evaluate. 2. Stable 7 mm right lower lobe pulmonary nodule with a slightly more conspicuous 3 mm nodule in the right lower lobe. Continued close attention on follow-up recommended. 3. No evidence for metastatic disease in the  abdomen or pelvis. 4. Aortic Atherosclerosis (ICD10-I70.0).   03/17/2021 PET scan   IMPRESSION: 1. Part solid nodule of  the left upper lobe demonstrates mild FDG uptake, and has increased in size when compared with prior exam dated May 05, 2020. Concerning for indolent primary lung neoplasm. 2. No evidence of metastatic disease in the chest, abdomen or pelvis. 3. 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 Recommend further evaluation with tissue sampling. 4. Aortic Atherosclerosis (ICD10-I70.0).      INTERVAL HISTORY:  Briana Ochoa is here for a follow up of colon cancer. She was last seen by Dr. Burr Medico on 06/18/21. She presents to the clinic alone. Patient reports she is tolerating the treatment without any significant limitations. She continues to have the metallic taste in her mouth but denies any weight loss. She denies any changes to her energy levels and can complete all her daily activities on her own. She denies nausea, vomiting or abdominal pain. Her bowel habits are unchanged. She denies fevers, chills, sweats, shortness of breath, chest pain or cough. She has no other complaints.    All other systems were reviewed with the patient and are negative.  MEDICAL HISTORY:  Past Medical History:  Diagnosis Date   Allergy    seasonal allergies   Anemia    on meds   Aortic atherosclerosis (HCC)    Bladder cancer ([place]) 2020   Bladder tumor    Bloody diarrhea 09/29/2017   Breast cancer ([place]) 2015   Left Breast Cancer   Cataract    bilateral -sx   CKD (chronic kidney disease), stage III ([place])    DM related   Colon cancer ([place])    Diabetes mellitus type 2, diet-controlled ([place])    diet controlled   Dyspnea 09/28/2017   Dysuria    Family history of adverse reaction to anesthesia    aunt- N/V    Family history of breast cancer    Family history of kidney cancer    Family history of throat cancer    Family history  of uterine cancer    Fatigue 09/28/2017   Frequency of urination    Grade I diastolic dysfunction 29/47/6546   Noted on ECHO   Gross hematuria 09/29/2017   Heart murmur    since rheumatic fever as child   Hematuria    History of cardiomegaly 02/12/2005   Mild, noted on CXR   History of CVA (cerebrovascular accident) 08/05/2014   post op cerebral angiogram, cerebral thrombosis w/ cerebral infarction;  11-02-2017  per pt no residuals   History of malignant melanoma of skin 12/2015   s/p wide local excision right lower leg (per pt localized)   History of rheumatic fever as a child    Hyperlipidemia    on meds   Hypertension    on meds   Intracranial aneurysm dx 07/ 2015   paraophthalmic RICA aneurysm /   s/p  Pipeline embolization right ICA 01-09-2014   Jaundice 09/28/2017   Malignant neoplasm of central portion of left breast in female, estrogen receptor positive [facility]) oncologist-  dr Lindi Adie   dx 07/ 2015--- DCIS, ER/PR positive-- s/p  left breast lumpectomy 09-19-2013,  completed radiation 11-12-2013,  started arimidex 11-12-2013   Melanoma ([place])    righ tleg    OA (osteoarthritis)    knees, hands, R shoulder   Osteopenia 10/16/2012   Diffuse   Personal history of radiation therapy    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   Sigmoid diverticulosis    Weakness 09/28/2017   Wears dentures  upper   Wears glasses    Wears hearing aid in both ears     SURGICAL HISTORY: Past Surgical History:  Procedure Laterality Date   BREAST LUMPECTOMY Left 2015   BREAST LUMPECTOMY WITH NEEDLE LOCALIZATION Left 09/19/2013   Procedure: LEFT BREAST LUMPECTOMY WITH DOUBLE WIRE BRACKETED  NEEDLE LOCALIZATION;  Surgeon: [name], MD;  Location: [facility];  Service: General;  Laterality: Left;   BRONCHIAL BIOPSY  04/06/2021   Procedure: BRONCHIAL BIOPSIES;  Surgeon: Garner Nash, DO;  Location: [place] [facility];  Service: Pulmonary;;   BRONCHIAL BRUSHINGS  04/06/2021   Procedure:  BRONCHIAL BRUSHINGS;  Surgeon: Garner Nash, DO;  Location: [place] [facility];  Service: Pulmonary;;   CATARACT EXTRACTION W/ INTRAOCULAR LENS  IMPLANT, BILATERAL  06/2015   CESAREAN SECTION  1964   CHOLECYSTECTOMY OPEN  1970s   CYSTOSCOPY W/ URETERAL STENT PLACEMENT Left 11/07/2017   Procedure: CYSTOSCOPY WITH RETROGRADE PYELOGRAM/URETERAL STENT PLACEMENT;  Surgeon: Cleon Gustin, MD;  Location: [facility];  Service: Urology;  Laterality: Left;   CYSTOSCOPY W/ URETERAL STENT PLACEMENT Bilateral 04/27/2018   Procedure: CYSTOSCOPY WITH RETROGRADE PYELOGRAM;  Surgeon: Cleon Gustin, MD;  Location: [facility];  Service: Urology;  Laterality: Bilateral;   FINGER SURGERY     lt thumb  from dog bite   IR  NEPHROURETERAL CATH PLACE LEFT  04/28/2018   MELANOMA EXCISION Right 12/28/2015   Procedure: EXCISION MELANOMA RIGHT LOWER LEG;  Surgeon: Fanny Skates, MD;  Location: [place];  Service: General;  Laterality: Right;   RADIOLOGY WITH ANESTHESIA N/A 01/09/2014   Procedure: Embolization;  Surgeon: Consuella Lose, MD;  Location: [place];  Service: Radiology;  Laterality: N/A;   SPLENECTOMY, TOTAL  2009 approx.   splen injury due to fall off horse   TONSILLECTOMY  child   TRANSURETHRAL RESECTION OF BLADDER TUMOR N/A 11/07/2017   Procedure: TRANSURETHRAL RESECTION OF BLADDER TUMOR (TURBT), POSSIBLE STENT PLACEMENT;  Surgeon: Cleon Gustin, MD;  Location[facility];  Service: Urology;  Laterality: N/A;   TRANSURETHRAL RESECTION OF BLADDER TUMOR N/A 12/14/2017   Procedure: TRANSURETHRAL RESECTION OF BLADDER TUMOR (TURBT);  Surgeon: Cleon Gustin, MD;  Location: [facility];  Service: Urology;  Laterality: N/A;  [place] TUMOR N/A 04/27/2018   Procedure: TRANSURETHRAL RESECTION OF BLADDER TUMOR (TURBT);  Surgeon: Cleon Gustin, MD;  Location: [facility];  Service: Urology;  Laterality: N/A;  [place] ULTRASOUND  04/06/2021   Procedure: RADIAL ENDOBRONCHIAL ULTRASOUND;  Surgeon: Garner Nash, DO;  Location: [place] [facility];  Service: Pulmonary;;    I have reviewed the social history and family history with the patient and they are unchanged from previous note.  ALLERGIES:  is allergic to feraheme [ferumoxytol].  MEDICATIONS:  Current Outpatient Medications  Medication Sig Dispense Refill   acetaminophen (TYLENOL) 500 MG tablet Take 1,000 mg by mouth daily.     glucose blood (TRUE METRIX BLOOD GLUCOSE TEST) test strip Use as instructed to test blood sugar once daily E08.638 100 each 2   lisinopril (ZESTRIL) 20 MG tablet Take 1 tablet (20 mg total) by mouth daily. 90 tablet 3   magic mouthwash w/lidocaine SOLN Take 5 mLs by mouth every 6 (six) hours as needed for mouth pain. Swish and swallow 480 mL 1   Miconazole Nitrate-Wipes (MONISTAT 7 COMPLETE THERAPY) 100-2 MG-% KIT Place  1 kit vaginally once a week. 1 kit 1   pravastatin (PRAVACHOL) 10 MG tablet TAKE 1 TABLET EVERY DAY (STOP PRAVASTATIN 20MG) 90 tablet 3   No current facility-administered medications for this visit.    PHYSICAL EXAMINATION: ECOG PERFORMANCE STATUS: 1 - Symptomatic but completely ambulatory  Vitals:   08/06/21 0936  BP: 133/64  Pulse: 69  Resp: 17  Temp: 97.8 F (36.6 C)  SpO2: 97%   Wt Readings from Last 3 Encounters:  08/06/21 179 lb 14.4 oz (81.6 kg)  07/16/21 176 lb 12.8 oz (80.2 kg)  06/18/21 176 lb 11.2 oz (80.2 kg)     GENERAL:alert, no distress and comfortable SKIN: skin color normal, no rashes or significant lesions EYES: normal, Conjunctiva are pink and non-injected, sclera clear  NEURO: alert & oriented x 3 with fluent speech  LABORATORY DATA:  I have reviewed the data as listed    Latest Ref Rng & Units 08/06/2021    9:25 AM 07/16/2021    8:28 AM 06/18/2021    8:46 AM  CBC  WBC 4.0 -  10.5 K/uL 9.2  8.2  9.8   Hemoglobin 12.0 - 15.0 g/dL 15.5  14.9  15.2   Hematocrit 36.0 - 46.0 % 46.1  44.6  44.8   Platelets 150 - 400 K/uL 282  276  288         Latest Ref Rng & Units 08/06/2021    9:25 AM 07/16/2021    8:28 AM 06/18/2021    8:46 AM  CMP  Glucose 70 - 99 mg/dL 134  147  108   BUN 8 - 23 mg/dL 27  28  30    Creatinine 0.44 - 1.00 mg/dL 1.49  1.45  1.46   Sodium 135 - 145 mmol/L 140  140  138   Potassium 3.5 - 5.1 mmol/L 4.8  4.9  5.1   Chloride 98 - 111 mmol/L 108  108  106   CO2 22 - 32 mmol/L 26  25  26    Calcium 8.9 - 10.3 mg/dL 9.7  9.6  9.4   Total Protein 6.5 - 8.1 g/dL 7.1  6.6  7.1   Total Bilirubin 0.3 - 1.2 mg/dL 0.3  0.3  0.3   Alkaline Phos 38 - 126 U/L 77  73  76   AST 15 - 41 U/L 13  13  12    ALT 0 - 44 U/L 8  8  7        RADIOGRAPHIC STUDIES: I have personally reviewed the radiological images as listed and agreed with the findings in the report. No results found.    ASSESSMENT & PLAN:  ETHELEEN VALTIERRA is a 81 y.o. female with   1. Colon cancer in Splenic Flexure, pT3N0M0, Stage II, MSI-High -she was diagnosed on 04/23/20 by Colonoscopy  -resection with Dr Marcello Moores on 05/28/20 showed 9.7cm mass in splenic flexure with invasive adenocarcinoma, poorly differentiated, negative margins, negative LNs.  -GuardantReveal on 07/07/20 was positive, which likely indicates microscopic disease and high risk of recurrence. -PET scan was recommended but denied by her insurance. She proceeded with CT CAP w/o contrast on 08/31/20 showing no evidence of active malignancy.  -She began Xeloda and Keytruda on 09/21/20. She has tolerated treatment well overall. However, Xeloda held due to increased Cr and then low EGFR from 10/28-02/09/21, and Beryle Flock was held due to taste change/mouth numbness from 12/25/20 - 02/09/21. Xeloda completed 04/2021. -PET scan 03/17/21 showed: mild FDG uptake to the LUL nodule,  which has increased in size since 04/2020; no evidence of metastatic  disease; hypermetabolic subcentimeter nodule of right parotid gland.  -she proceeded to bronchoscopy and biopsy on 04/06/21 under Dr. Valeta Harms. Cytology was negative for malignant cells.  -We will continue Keytruda, plan for total one year.   2. Symptom Management: taste change, altered mouth sensation -she has experienced taste changes and mouth burning without ulceration. -symptoms persisted despite holding Xeloda/Keytruda and a course of steroids -she reports she had a brain MRI with Dr. Kathyrn Sheriff, who told her it appeared normal. (The images are available in PACS, dated 01/19/21) -on B12 injections, her taste has improved, but she still notes the metallic taste in her mouth.   3. H/o DVT 04/2020 -she was found to have an elevated D-dimer back on 04/08/20. She was found to have DVT in the RLE. This was still present on 04/28/20 and she was started on Eliquis shortly after. -she has switched to baby aspirin.   4. H/o of Left breast DCIS, H/o Bladder cancer, H/o Melanoma in 2017 of right lower leg -Diagnosed with DCIS in 2015, S/p left lumpectomy, Adjuvant Radiation and 5 years of Anastrozole. Treated by Dr Lindi Adie. -Diagnosed with bladder cancer in 02/2018, early stage, S/p surgery. Followed by Dr. Alyson Ingles, next cystoscopy scheduled for 05/03/21. -most recent mammogram from 02/11/21 was negative.   5. Genetic Testing -Given her personal history of cancer and family history of cancer, I recommend genetic testing.  -performed 07/07/20, results negative   6. Comorbidites: CKD, DM, Heart murmur, H/o mini stroke from Brain aneurysm, HTN, Osteopenia -Continue medications and to f/u with other physicians for management.    7. B12 deficiency -Recent lab work for her taste change -Started B12 injection on 04/09/21, now monthly     PLAN:  -Labs today were reviewed and adequate for treatment.  -Patient is tolerating treatment so she will proceed with Keytruda infusion -RTC in 3 weeks for labs, f/u visit  with Dr. Burr Medico prior to another Keytruda infusion.  -Plan to obtain restaging CT scans in early August. I will place order today to work on [employer].    Orders Placed This Encounter  Procedures   CT CHEST ABDOMEN PELVIS W CONTRAST    Standing Status:   Future    Standing Expiration Date:   08/10/2022    Scheduling Instructions:     Needs to be scheduled before 09/17/2021 visit with Dr. Burr Medico    Order Specific Question:   Preferred imaging location?    Answer:   [facility]    Order Specific Question:   Is Oral Contrast requested for this exam?    Answer:   Yes, Per Radiology protocol   All questions were answered. The patient knows to call the clinic with any problems, questions or concerns. No barriers to learning was detected.  I have spent a total of 30 minutes minutes of face-to-face and non-face-to-face time, preparing to see the patient,  performing a medically appropriate examination, counseling and educating the patient, ordering medications/tests, documenting clinical information in the electronic health record,  and care coordination.   Dede Query PA-C Dept of Hematology and [place] at [facility] Phone: [phone]

## 2021-08-20 ENCOUNTER — Ambulatory Visit: Payer: Medicare PPO

## 2021-08-27 ENCOUNTER — Other Ambulatory Visit: Payer: Self-pay

## 2021-08-27 ENCOUNTER — Inpatient Hospital Stay: Payer: Medicare PPO | Attending: Hematology | Admitting: Hematology

## 2021-08-27 ENCOUNTER — Inpatient Hospital Stay: Payer: Medicare PPO

## 2021-08-27 ENCOUNTER — Encounter: Payer: Self-pay | Admitting: Hematology

## 2021-08-27 VITALS — BP 128/64 | HR 66 | Resp 18

## 2021-08-27 VITALS — BP 121/70 | HR 76 | Temp 98.3°F | Resp 18 | Ht 64.0 in | Wt 181.3 lb

## 2021-08-27 DIAGNOSIS — M8580 Other specified disorders of bone density and structure, unspecified site: Secondary | ICD-10-CM | POA: Diagnosis not present

## 2021-08-27 DIAGNOSIS — C185 Malignant neoplasm of splenic flexure: Secondary | ICD-10-CM | POA: Diagnosis not present

## 2021-08-27 DIAGNOSIS — I129 Hypertensive chronic kidney disease with stage 1 through stage 4 chronic kidney disease, or unspecified chronic kidney disease: Secondary | ICD-10-CM | POA: Insufficient documentation

## 2021-08-27 DIAGNOSIS — Z8551 Personal history of malignant neoplasm of bladder: Secondary | ICD-10-CM | POA: Insufficient documentation

## 2021-08-27 DIAGNOSIS — Z7901 Long term (current) use of anticoagulants: Secondary | ICD-10-CM | POA: Diagnosis not present

## 2021-08-27 DIAGNOSIS — E538 Deficiency of other specified B group vitamins: Secondary | ICD-10-CM | POA: Diagnosis not present

## 2021-08-27 DIAGNOSIS — N183 Chronic kidney disease, stage 3 unspecified: Secondary | ICD-10-CM | POA: Insufficient documentation

## 2021-08-27 DIAGNOSIS — E1122 Type 2 diabetes mellitus with diabetic chronic kidney disease: Secondary | ICD-10-CM | POA: Diagnosis not present

## 2021-08-27 DIAGNOSIS — Z853 Personal history of malignant neoplasm of breast: Secondary | ICD-10-CM | POA: Insufficient documentation

## 2021-08-27 DIAGNOSIS — C186 Malignant neoplasm of descending colon: Secondary | ICD-10-CM

## 2021-08-27 DIAGNOSIS — Z85820 Personal history of malignant melanoma of skin: Secondary | ICD-10-CM | POA: Diagnosis not present

## 2021-08-27 DIAGNOSIS — Z5112 Encounter for antineoplastic immunotherapy: Secondary | ICD-10-CM | POA: Diagnosis not present

## 2021-08-27 DIAGNOSIS — Z86718 Personal history of other venous thrombosis and embolism: Secondary | ICD-10-CM | POA: Diagnosis not present

## 2021-08-27 LAB — CMP (CANCER CENTER ONLY)
ALT: 9 U/L (ref 0–44)
AST: 14 U/L — ABNORMAL LOW (ref 15–41)
Albumin: 3.9 g/dL (ref 3.5–5.0)
Alkaline Phosphatase: 67 U/L (ref 38–126)
Anion gap: 6 (ref 5–15)
BUN: 33 mg/dL — ABNORMAL HIGH (ref 8–23)
CO2: 24 mmol/L (ref 22–32)
Calcium: 9.4 mg/dL (ref 8.9–10.3)
Chloride: 107 mmol/L (ref 98–111)
Creatinine: 1.48 mg/dL — ABNORMAL HIGH (ref 0.44–1.00)
GFR, Estimated: 35 mL/min — ABNORMAL LOW (ref >60)
Glucose, Bld: 213 mg/dL — ABNORMAL HIGH (ref 70–99)
Potassium: 5 mmol/L (ref 3.5–5.1)
Sodium: 137 mmol/L (ref 135–145)
Total Bilirubin: 0.3 mg/dL (ref 0.3–1.2)
Total Protein: 6.5 g/dL (ref 6.5–8.1)

## 2021-08-27 LAB — CBC WITH DIFFERENTIAL (CANCER CENTER ONLY)
Abs Immature Granulocytes: 0.02 10*3/uL (ref 0.00–0.07)
Basophils Absolute: 0.1 10*3/uL (ref 0.0–0.1)
Basophils Relative: 1 %
Eosinophils Absolute: 0.6 10*3/uL — ABNORMAL HIGH (ref 0.0–0.5)
Eosinophils Relative: 7 %
HCT: 44.4 % (ref 36.0–46.0)
Hemoglobin: 15.1 g/dL — ABNORMAL HIGH (ref 12.0–15.0)
Immature Granulocytes: 0 %
Lymphocytes Relative: 17 %
Lymphs Abs: 1.5 10*3/uL (ref 0.7–4.0)
MCH: 32.8 pg (ref 26.0–34.0)
MCHC: 34 g/dL (ref 30.0–36.0)
MCV: 96.3 fL (ref 80.0–100.0)
Monocytes Absolute: 0.8 10*3/uL (ref 0.1–1.0)
Monocytes Relative: 10 %
Neutro Abs: 5.8 10*3/uL (ref 1.7–7.7)
Neutrophils Relative %: 65 %
Platelet Count: 284 10*3/uL (ref 150–400)
RBC: 4.61 MIL/uL (ref 3.87–5.11)
RDW: 12.7 % (ref 11.5–15.5)
WBC Count: 8.9 10*3/uL (ref 4.0–10.5)
nRBC: 0 % (ref 0.0–0.2)

## 2021-08-27 MED ORDER — SODIUM CHLORIDE 0.9 % IV SOLN: Freq: Once | INTRAVENOUS | Status: AC

## 2021-08-27 MED ORDER — CYANOCOBALAMIN 1000 MCG/ML IJ SOLN
1000.0000 ug | Freq: Once | INTRAMUSCULAR | Status: AC
  Administered 2021-08-27: 1000 ug via INTRAMUSCULAR
  Filled 2021-08-27: qty 1

## 2021-08-27 MED ORDER — SODIUM CHLORIDE 0.9 % IV SOLN
200.0000 mg | Freq: Once | INTRAVENOUS | Status: AC
  Administered 2021-08-27: 200 mg via INTRAVENOUS
  Filled 2021-08-27: qty 200

## 2021-08-27 NOTE — Patient Instructions (Signed)
[facility]  Discharge Instructions: Thank you for choosing [facility] to provide your oncology and hematology care.   If you have a lab appointment with the Cancer Center, please go directly to the Cancer Center and check in at the registration area.   Wear comfortable clothing and clothing appropriate for easy access to any Portacath or PICC line.   We strive to give you quality time with your provider. You may need to reschedule your appointment if you arrive late (15 or more minutes).  Arriving late affects you and other patients whose appointments are after yours.  Also, if you miss three or more appointments without notifying the office, you may be dismissed from the clinic at the provider's discretion.      For prescription refill requests, have your pharmacy contact our office and allow 72 hours for refills to be completed.    Today you received the following chemotherapy and/or immunotherapy agents: Keytruda      To help prevent nausea and vomiting after your treatment, we encourage you to take your nausea medication as directed.  BELOW ARE SYMPTOMS THAT SHOULD BE REPORTED IMMEDIATELY: *FEVER GREATER THAN 100.4 F (38 C) OR HIGHER *CHILLS OR SWEATING *NAUSEA AND VOMITING THAT IS NOT CONTROLLED WITH YOUR NAUSEA MEDICATION *UNUSUAL SHORTNESS OF BREATH *UNUSUAL BRUISING OR BLEEDING *URINARY PROBLEMS (pain or burning when urinating, or frequent urination) *BOWEL PROBLEMS (unusual diarrhea, constipation, pain near the anus) TENDERNESS IN MOUTH AND THROAT WITH OR WITHOUT PRESENCE OF ULCERS (sore throat, sores in mouth, or a toothache) UNUSUAL RASH, SWELLING OR PAIN  UNUSUAL VAGINAL DISCHARGE OR ITCHING   Items with * indicate a potential emergency and should be followed up as soon as possible or go to the Emergency Department if any problems should occur.  Please show the CHEMOTHERAPY ALERT CARD or IMMUNOTHERAPY ALERT CARD at check-in to  the Emergency Department and triage nurse.  Should you have questions after your visit or need to cancel or reschedule your appointment, please contact [facility]  Dept: [phone]  and follow the prompts.  Office hours are 8:00 a.m. to 4:30 p.m. Monday - Friday. Please note that voicemails left after 4:00 p.m. may not be returned until the following business day.  We are closed weekends and major holidays. You have access to a nurse at all times for urgent questions. Please call the main number to the clinic Dept: [phone] and follow the prompts.   For any non-urgent questions, you may also contact your provider using MyChart. We now offer e-Visits for anyone 18 and older to request care online for non-urgent symptoms. For details visit [email or web address].   Also download the MyChart app! Go to the app store, search "MyChart", open the app, select , and log in with your MyChart username and password.  Masks are optional in the cancer centers. If you would like for your care team to wear a mask while they are taking care of you, please let them know. For doctor visits, patients may have with them one support person who is at least 81 years old. At this time, visitors are not allowed in the infusion area. 

## 2021-08-27 NOTE — Progress Notes (Addendum)
[place]   [phone] Fax:([phone]) [phone]   Clinic Follow up Note   Patient Care Team: Nche, [name], NP as PCP - General (Internal Medicine) Fanny Skates, MD as Consulting Physician (General Surgery) Thea Silversmith, MD as Consulting Physician (Radiation Oncology) Nicholas Lose, MD as Consulting Physician (Hematology and Oncology) Garvin Fila, MD as Consulting Physician (Neurology) Consuella Lose, MD as Consulting Physician (Neurosurgery) Katy Apo, MD as Consulting Physician (Ophthalmology) Jonnie Finner, RN (Inactive) as Oncology Nurse Navigator Truitt Merle, MD as Consulting Physician (Hematology and Oncology) Leighton Ruff, MD as Consulting Physician (General Surgery)  Date of Service:  08/27/2021  CHIEF COMPLAINT: f/u of colon cancer  CURRENT THERAPY:  -Keytruda, started 09/21/20. Held 12/25/20-02/09/21 -B12 injections, started 04/09/21, currently monthly  ASSESSMENT & PLAN:  Briana Ochoa is a 81 y.o. female with   1. Colon cancer in Splenic Flexure, pT3N0M0, Stage II, MSI-High -she was diagnosed on 04/23/20 by Colonoscopy  -resection with Dr Marcello Moores on 05/28/20 showed 9.7cm mass in splenic flexure with invasive adenocarcinoma, poorly differentiated, negative margins, negative LNs.  -GuardantReveal on 07/07/20 was positive, which likely indicates microscopic disease and high risk of recurrence. -PET scan was recommended but denied by her insurance. She proceeded with CT CAP w/o contrast on 08/31/20 showing no evidence of active malignancy.  -She began Xeloda and Keytruda on 09/21/20. She has tolerated treatment well overall. However, Xeloda held due to increased Cr and then low EGFR from 10/28-02/09/21, and Beryle Flock was held due to taste change/mouth numbness from 12/25/20 - 02/09/21. Xeloda completed 04/2021. -PET scan 03/17/21 showed: mild FDG uptake to the LUL nodule, which has increased in size since 04/2020; no evidence of  metastatic disease; hypermetabolic subcentimeter nodule of right parotid gland.  -she proceeded to bronchoscopy and biopsy on 04/06/21 under Dr. Valeta Harms. Cytology was negative for malignant cells.  -she continues to tolerate treatment very well with no noticeable side effects. Labs reviewed, overall stable except sugar up to 213. She admits to eating more sweets lately as they satisfy her mouth and taste changes. We will continue Keytruda, plan for total one year. -She will complete a 1 year Keytruda therapy in 3 weeks.  If repeat CT scan negative for recurrence, will stop, and continue cancer surveillance. -She is scheduled for f/u chest CT on 09/24/21. I have ordered CT AP to hopefully be done at same time.   2. Symptom Management: taste change, altered mouth sensation -she has experienced taste changes and mouth burning without ulceration. -symptoms persisted despite holding Xeloda/Keytruda and a course of steroids -she reports she had a brain MRI with Dr. Kathyrn Sheriff, who told her it appeared normal. (The images are available in PACS, dated 01/19/21) -on B12 injections, her taste has improved, but she still notes the metallic taste in her mouth.   3. H/o DVT 04/2020 -she was found to have an elevated D-dimer back on 04/08/20. She was found to have DVT in the RLE. This was still present on 04/28/20 and she was started on Eliquis shortly after. -she has switched to baby aspirin.   4. H/o of Left breast DCIS, H/o Bladder cancer, H/o Melanoma in 2017 of right lower leg -Diagnosed with DCIS in 2015, S/p left lumpectomy, Adjuvant Radiation and 5 years of Anastrozole under Dr Lindi Adie. -Diagnosed with bladder cancer in 02/2018, early stage, S/p surgery. Followed by Dr. Alyson Ingles, last cystoscopy 06/09/21. -most recent mammogram from 02/11/21 was negative.   5. Genetic Testing -Given her personal history of cancer  and family history of cancer, I recommend genetic testing.  -performed 07/07/20, results negative    6. Comorbidites: CKD, DM, Heart murmur, H/o mini stroke from Brain aneurysm, HTN, Osteopenia -Continue medications and to f/u with other physicians for management.    7. B12 deficiency -Recent lab work for her taste change -Started B12 injection on 04/09/21, now monthly -will check level next visit      PLAN:  -proceed with Keytruda infusion and B12 injection today -lab, f/u, and Keytruda 8/11 -chest CT 8/18, will hopefully add CT AP to this, phone visit the week after CT    No problem-specific Assessment & Plan notes found for this encounter.   SUMMARY OF ONCOLOGIC HISTORY: Oncology History Overview Note  Cancer Staging Cancer of left colon [facility]) Staging form: Colon and Rectum, AJCC 8th Edition - Pathologic stage from 05/28/2020: Stage IIA (pT3, pN0, cM0) - Signed by Truitt Merle, MD on 06/19/2020 Stage prefix: Initial diagnosis Total positive nodes: 0 Histologic grading system: 4 grade system Histologic grade (G): G3 Residual tumor (R): R0 - None  Ductal carcinoma in situ (DCIS) of left breast Staging form: Breast, AJCC 7th Edition - Clinical: Stage 0 (Tis (DCIS), N0, cM0) - Unsigned Specimen type: Core Needle Biopsy Histopathologic type: 9932 Laterality: Left Staging comments: Staged at breast conference 7.22.15  - Pathologic: Stage 0 (Tis (DCIS)(2), N0, cM0) - Unsigned Specimen type: Core Needle Biopsy Histopathologic type: 9932 Laterality: Left Tumor size (mm): 3 Multiple tumors: Yes Number of tumors: 2 Method of lymph node assessment: Clinical Method of detection of distant metastases: Clinical Residual tumor (R): R0 - None Estrogen receptor status: Positive Progesterone receptor status: Positive    Ductal carcinoma in situ (DCIS) of left breast  08/20/2013 Initial Diagnosis   Left breast 12:00: DCIS with calcifications, yet 100%, PR 100%; 09/04/2013 second group of calcifications also biopsied proven to be DCIS ER/PR positive   09/19/2013 Surgery   Left breast  lumpectomy DCIS 2 foci margins negative, 0.2 and 0.3 cm ER 100% PR 100%   10/08/2013 - 11/12/2013 Radiation Therapy   Adjuvant radiation therapy   11/12/2013 - 2020 Anti-estrogen oral therapy   Anastrozole 1 mg daily plan is for 5 years   01/09/2014 - 01/10/2014 Hospital Admission   Pipeline embolization 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 The report date is 07/30/2020.   The CancerNext-Expanded + RNAinsight gene panel offered by [employer] and includes sequencing and rearrangement analysis for the following 77 genes: AIP, ALK, APC, ATM, AXIN2, BAP1, BARD1, BLM, BMPR1A, BRCA1, BRCA2, BRIP1, CDC73, CDH1, CDK4, CDKN1B, CDKN2A, CHEK2, CTNNA1, DICER1, FANCC, FH, FLCN, GALNT12, KIF1B, LZTR1, MAX, MEN1, MET, MLH1, MSH2, MSH3, MSH6, MUTYH, NBN, NF1, NF2, NTHL1, PALB2, PHOX2B, PMS2, POT1, PRKAR1A, PTCH1, PTEN, RAD51C, RAD51D, RB1, RECQL, RET, SDHA, SDHAF2, SDHB, SDHC, SDHD, SMAD4, SMARCA4, SMARCB1, SMARCE1, STK11, SUFU, TMEM127, TP53, TSC1, TSC2, VHL and XRCC2 (sequencing and deletion/duplication); EGFR, EGLN1, HOXB13, KIT, MITF, PDGFRA, POLD1 and POLE (sequencing only); EPCAM and GREM1 (deletion/duplication only). RNA data is routinely analyzed for use in variant interpretation for all genes.   Bladder cancer ([place])  02/09/2018 Initial Diagnosis   Bladder cancer ([place])   07/30/2020 Genetic Testing   Negative genetic testing:  No pathogenic variants detected on the [employer] CancerNext-Expanded + RNAinsight panel. The report date is 07/30/2020.   The CancerNext-Expanded + RNAinsight gene panel offered by [employer] and includes sequencing  and rearrangement analysis for the following 77 genes: AIP, ALK, APC, ATM, AXIN2, BAP1, BARD1, BLM, BMPR1A, BRCA1, BRCA2, BRIP1, CDC73, CDH1, CDK4, CDKN1B, CDKN2A, CHEK2, CTNNA1, DICER1, FANCC, FH, FLCN, GALNT12, KIF1B, LZTR1, MAX, MEN1, MET,  MLH1, MSH2, MSH3, MSH6, MUTYH, NBN, NF1, NF2, NTHL1, PALB2, PHOX2B, PMS2, POT1, PRKAR1A, PTCH1, PTEN, RAD51C, RAD51D, RB1, RECQL, RET, SDHA, SDHAF2, SDHB, SDHC, SDHD, SMAD4, SMARCA4, SMARCB1, SMARCE1, STK11, SUFU, TMEM127, TP53, TSC1, TSC2, VHL and XRCC2 (sequencing and deletion/duplication); EGFR, EGLN1, HOXB13, KIT, MITF, PDGFRA, POLD1 and POLE (sequencing only); EPCAM and GREM1 (deletion/duplication only). RNA data is routinely analyzed for use in variant interpretation for all genes.   08/31/2020 Imaging   CT CAP w/o contrast  IMPRESSION: 1. No specific findings of active malignancy on today's noncontrast CT. There has been interval partial colectomy involving the tumor site with reanastomosis. Some faint stranding in the adjacent omentum is probably from scarring or fat necrosis, less likely due to early tumor given the lack of overt nodularity. Surveillance imaging is likely indicated, and PET-CT may have a role in imaging follow up. 2. Several small pulmonary nodules, in addition to a larger mixed density nodule in the left upper lobe. Surveillance is recommended. 3. Other imaging findings of potential clinical significance: Aortic Atherosclerosis (ICD10-I70.0). Coronary atherosclerosis. Mitral valve calcification. Small type 1 hiatal hernia. Splenectomy with small amount of regenerative splenic tissue as well as a venous varix arising from the splenic vein. Hyperdense left kidney lower pole exophytic homogeneous lesion, probably a complex cyst. Sigmoid colon diverticulosis. Multilevel lumbar spondylosis and degenerative disc disease.   Cancer of left colon (HCC)  04/23/2020 Procedure   Upper Endoscopy and Colonoscopy by Dr Leone Payor IMPRESSION Erythematous mucosa in the antrum. Biopsied. - Small hiatal hernia. - Gastroesophageal flap valve classified as Hill Grade IV (no fold, wide open lumen, hiatal hernia present). - The examination was otherwise normal.   IMPRESSION -  Decreased sphincter tone found on digital rectal exam. - Malignant partially obstructing tumor in the proximal descending colon. Biopsied. Tattooed. - Three 5 to 20 mm polyps in the sigmoid colon, removed piecemeal using a cold snare. Resected and retrieved. - Severe diverticulosis in the sigmoid colon. - I left several diminutive descending (proximal to tattoo), sigmoid and rectal polyps - The examination was otherwise normal on direct and retroflexion views.     04/23/2020 Initial Diagnosis   Diagnosis 1. Descending Colon Polyp, mass - ADENOCARCINOMA. 2. Sigmoid Colon Polyp, (3) - MULTIPLE FRAGMENTS OF TUBULAR ADENOMA. - MULTIPLE FRAGMENTS OF SESSILE SERRATED POLYP WITHOUT DYSPLASIA. - NO HIGH GRADE DYSPLASIA OR MALIGNANCY. Microscopic Comment 1. Dr. Kenard Gower has reviewed the case. Dr. Leone Payor was notified on 04/24/2020. 2. Despite being left sided the fragments have features of a sessile serrated polyp, as opposed to, hyperplastic polyp.   04/27/2020 Imaging   CT AP IMPRESSION: Bulky annular soft tissue mass involving the distal transverse colon with extension into adjacent pericolonic fat, consistent with primary colon carcinoma.   No evidence of metastatic disease.   Colonic diverticulosis. No radiographic evidence of diverticulitis.   Aortic Atherosclerosis (ICD10-I70.0).     05/05/2020 Imaging   CT Chest IMPRESSION: Bulky annular soft tissue mass involving the distal transverse colon with extension into adjacent pericolonic fat, consistent with primary colon carcinoma.   No evidence of metastatic disease.   Colonic diverticulosis. No radiographic evidence of diverticulitis.   Aortic Atherosclerosis (ICD10-I70.0).     05/28/2020 Initial Diagnosis   Cancer of left colon (HCC)   05/28/2020 Cancer Staging  Staging form: Colon and Rectum, AJCC 8th Edition - Pathologic stage from 05/28/2020: Stage IIA (pT3, pN0, cM0) - Signed by Truitt Merle, MD on 06/19/2020 Stage  prefix: Initial diagnosis Total positive nodes: 0 Histologic grading system: 4 grade system Histologic grade (G): G3 Residual tumor (R): R0 - None   05/28/2020 Surgery   XI ROBOT ASSISTED RESECTION OF SPLENIC FLEXURE by Dr Marcello Moores    FINAL MICROSCOPIC DIAGNOSIS:   A. COLON, SPLENIC FLEXURE, RESECTION:  - Invasive adenocarcinoma, poorly differentiated, spanning 9.7 cm.  - Tumor invades through muscularis propria into pericolonic tissue.  - Resection margins are negative.  - Tubular adenoma (X1).  - Sessile serrate polyp without dysplasia (x2).  - Diverticulosis.  - Polypectomy scar.  - Twenty-four of twenty-four lymph nodes negative for carcinoma (0/24).  - See oncology table.    07/07/2020 Miscellaneous   Guardant Reveal ctDNA positive MSI high   07/30/2020 Genetic Testing   Negative genetic testing:  No pathogenic variants detected on the [employer] CancerNext-Expanded + RNAinsight panel. The report date is 07/30/2020.   The CancerNext-Expanded + RNAinsight gene panel offered by [employer] and includes sequencing and rearrangement analysis for the following 77 genes: AIP, ALK, APC, ATM, AXIN2, BAP1, BARD1, BLM, BMPR1A, BRCA1, BRCA2, BRIP1, CDC73, CDH1, CDK4, CDKN1B, CDKN2A, CHEK2, CTNNA1, DICER1, FANCC, FH, FLCN, GALNT12, KIF1B, LZTR1, MAX, MEN1, MET, MLH1, MSH2, MSH3, MSH6, MUTYH, NBN, NF1, NF2, NTHL1, PALB2, PHOX2B, PMS2, POT1, PRKAR1A, PTCH1, PTEN, RAD51C, RAD51D, RB1, RECQL, RET, SDHA, SDHAF2, SDHB, SDHC, SDHD, SMAD4, SMARCA4, SMARCB1, SMARCE1, STK11, SUFU, TMEM127, TP53, TSC1, TSC2, VHL and XRCC2 (sequencing and deletion/duplication); EGFR, EGLN1, HOXB13, KIT, MITF, PDGFRA, POLD1 and POLE (sequencing only); EPCAM and GREM1 (deletion/duplication only). RNA data is routinely analyzed for use in variant interpretation for all genes.   08/31/2020 Imaging   CT CAP w/o contrast  IMPRESSION: 1. No specific findings of active malignancy on today's noncontrast CT. There has been interval  partial colectomy involving the tumor site with reanastomosis. Some faint stranding in the adjacent omentum is probably from scarring or fat necrosis, less likely due to early tumor given the lack of overt nodularity. Surveillance imaging is likely indicated, and PET-CT may have a role in imaging follow up. 2. Several small pulmonary nodules, in addition to a larger mixed density nodule in the left upper lobe. Surveillance is recommended. 3. Other imaging findings of potential clinical significance: Aortic Atherosclerosis (ICD10-I70.0). Coronary atherosclerosis. Mitral valve calcification. Small type 1 hiatal hernia. Splenectomy with small amount of regenerative splenic tissue as well as a venous varix arising from the splenic vein. Hyperdense left kidney lower pole exophytic homogeneous lesion, probably a complex cyst. Sigmoid colon diverticulosis. Multilevel lumbar spondylosis and degenerative disc disease.   10/02/2020 -  Chemotherapy   Patient is on Treatment Plan : COLORECTAL Pembrolizumab q21d     03/01/2021 Imaging   EXAM: CT CHEST, ABDOMEN AND PELVIS WITHOUT CONTRAST  IMPRESSION: 1. Mixed attenuation lesion posterior left upper lobe has areas of more confluent and enlarging nodularity today. Interval progression raises concern for neoplasm. Consider PET-CT to further evaluate. 2. Stable 7 mm right lower lobe pulmonary nodule with a slightly more conspicuous 3 mm nodule in the right lower lobe. Continued close attention on follow-up recommended. 3. No evidence for metastatic disease in the abdomen or pelvis. 4. Aortic Atherosclerosis (ICD10-I70.0).   03/17/2021 PET scan   IMPRESSION: 1. Part solid nodule of the left upper lobe demonstrates mild FDG uptake, and has increased in size when compared with prior  exam dated May 05, 2020. Concerning for indolent primary lung neoplasm. 2. No evidence of metastatic disease in the chest, abdomen or pelvis. 3. 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 Recommend further evaluation with tissue sampling. 4. Aortic Atherosclerosis (ICD10-I70.0).      INTERVAL HISTORY:  Briana Ochoa is here for a follow up of colon cancer. She was last seen by PA Murray Hodgkins on 08/09/21. She presents to the clinic alone. She reports she has developed a new inguinal rash, "in the front and the back." She notes it could be related to wearing pads constantly, which she wears mainly for some incontinence. She reports otherwise, she is tolerating treatment well. She notes her taste change is about the same. She explains she enjoys the taste of Captain [employer] cereal because of the salty and the sweet. She also notes she enjoys the "mouth feel" of ice cream.   All other systems were reviewed with the patient and are negative.  MEDICAL HISTORY:  Past Medical History:  Diagnosis Date   Allergy    seasonal allergies   Anemia    on meds   Aortic atherosclerosis (HCC)    Bladder cancer ([place]) 2020   Bladder tumor    Bloody diarrhea 09/29/2017   Breast cancer ([place]) 2015   Left Breast Cancer   Cataract    bilateral -sx   CKD (chronic kidney disease), stage III ([place])    DM related   Colon cancer ([place])    Diabetes mellitus type 2, diet-controlled ([place])    diet controlled   Dyspnea 09/28/2017   Dysuria    Family history of adverse reaction to anesthesia    aunt- N/V    Family history of breast cancer    Family history of kidney cancer    Family history of throat cancer    Family history of uterine cancer    Fatigue 09/28/2017   Frequency of urination    Grade I diastolic dysfunction 93/71/6967   Noted on ECHO   Gross hematuria 09/29/2017   Heart murmur    since rheumatic fever as child   Hematuria    History of cardiomegaly 02/12/2005   Mild, noted on CXR   History of CVA (cerebrovascular accident) 08/05/2014   post op cerebral angiogram, cerebral thrombosis w/ cerebral  infarction;  11-02-2017  per pt no residuals   History of malignant melanoma of skin 12/2015   s/p wide local excision right lower leg (per pt localized)   History of rheumatic fever as a child    Hyperlipidemia    on meds   Hypertension    on meds   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   Sigmoid diverticulosis    Weakness 09/28/2017   Wears dentures    upper   Wears glasses    Wears hearing aid in  both ears     SURGICAL HISTORY: Past Surgical History:  Procedure Laterality Date   BREAST LUMPECTOMY Left 2015   BREAST LUMPECTOMY WITH NEEDLE LOCALIZATION Left 09/19/2013   Procedure: LEFT BREAST LUMPECTOMY WITH DOUBLE WIRE BRACKETED  NEEDLE LOCALIZATION;  Surgeon: [name], MD;  Location: [place];  Service: General;  Laterality: Left;   BRONCHIAL BIOPSY  04/06/2021   Procedure: BRONCHIAL BIOPSIES;  Surgeon: Garner Nash, DO;  Location: [place] [facility];  Service: Pulmonary;;   BRONCHIAL BRUSHINGS  04/06/2021   Procedure: BRONCHIAL BRUSHINGS;  Surgeon: Garner Nash, DO;  Location: [place] [facility];  Service: Pulmonary;;   CATARACT EXTRACTION W/ INTRAOCULAR LENS  IMPLANT, BILATERAL  06/2015   CESAREAN SECTION  1964   CHOLECYSTECTOMY OPEN  1970s   CYSTOSCOPY W/ URETERAL STENT PLACEMENT Left 11/07/2017   Procedure: CYSTOSCOPY WITH RETROGRADE PYELOGRAM/URETERAL STENT PLACEMENT;  Surgeon: Cleon Gustin, MD;   Location: [facility];  Service: Urology;  Laterality: Left;   CYSTOSCOPY W/ URETERAL STENT PLACEMENT Bilateral 04/27/2018   Procedure: CYSTOSCOPY WITH RETROGRADE PYELOGRAM;  Surgeon: Cleon Gustin, MD;  Location: [facility];  Service: Urology;  Laterality: Bilateral;   FINGER SURGERY     lt thumb  from dog bite   IR  NEPHROURETERAL CATH PLACE LEFT  04/28/2018   MELANOMA EXCISION Right 12/28/2015   Procedure: EXCISION MELANOMA RIGHT LOWER LEG;  Surgeon: Fanny Skates, MD;  Location: [place];  Service: General;  Laterality: Right;   RADIOLOGY WITH ANESTHESIA N/A 01/09/2014   Procedure: Embolization;  Surgeon: Consuella Lose, MD;  [place];  Service: Radiology;  Laterality: N/A;   SPLENECTOMY, TOTAL  2009 approx.   splen injury due to fall off horse   TONSILLECTOMY  child   TRANSURETHRAL RESECTION OF BLADDER TUMOR N/A 11/07/2017   Procedure: TRANSURETHRAL RESECTION OF BLADDER TUMOR (TURBT), POSSIBLE STENT PLACEMENT;  Surgeon: Cleon Gustin, MD;  Location[facility];  Service: Urology;  Laterality: N/A;   TRANSURETHRAL RESECTION OF BLADDER TUMOR N/A 12/14/2017   Procedure: TRANSURETHRAL RESECTION OF BLADDER TUMOR (TURBT);  Surgeon: Cleon Gustin, MD;  Location[facility];  Service: Urology;  Laterality: N/A;  Ord TUMOR N/A 04/27/2018   Procedure: TRANSURETHRAL RESECTION OF BLADDER TUMOR (TURBT);  Surgeon: Cleon Gustin, MD;  Location: [facility];  Service: Urology;  Laterality: N/A;  [place] ULTRASOUND  04/06/2021   Procedure: RADIAL ENDOBRONCHIAL ULTRASOUND;  Surgeon: Garner Nash, DO;  Location: [facility];  Service: Pulmonary;;    I have reviewed the social history and family history with the patient and they are unchanged from previous note.  ALLERGIES:  is allergic to  feraheme [ferumoxytol].  MEDICATIONS:  Current Outpatient Medications  Medication Sig Dispense Refill   acetaminophen (TYLENOL) 500 MG tablet Take 1,000 mg by mouth daily.     glucose blood (TRUE METRIX BLOOD GLUCOSE TEST) test strip Use as instructed to test blood sugar once daily E08.638 100 each 2   lisinopril (ZESTRIL) 20 MG tablet Take 1 tablet (20 mg total) by mouth daily. 90 tablet 3   magic mouthwash w/lidocaine SOLN Take 5 mLs by mouth every 6 (six) hours as needed for mouth pain. Swish and swallow 480 mL 1   Miconazole Nitrate-Wipes (MONISTAT 7 COMPLETE THERAPY) 100-2 MG-% KIT Place 1 kit vaginally once a week. 1 kit 1   pravastatin (  PRAVACHOL) 10 MG tablet TAKE 1 TABLET EVERY DAY (STOP PRAVASTATIN $RemoveBeforeDEI'20MG'iDHnKErnCICjdOTR$ ) 90 tablet 3   No current facility-administered medications for this visit.    PHYSICAL EXAMINATION: ECOG PERFORMANCE STATUS: 0 - Asymptomatic  Vitals:   08/27/21 0932  BP: 121/70  Pulse: 76  Resp: 18  Temp: 98.3 F (36.8 C)  SpO2: 96%   Wt Readings from Last 3 Encounters:  08/27/21 181 lb 4.8 oz (82.2 kg)  08/06/21 179 lb 14.4 oz (81.6 kg)  07/16/21 176 lb 12.8 oz (80.2 kg)     GENERAL:alert, no distress and comfortable SKIN: skin color normal, no rashes or significant lesions EYES: normal, Conjunctiva are pink and non-injected, sclera clear  NEURO: alert & oriented x 3 with fluent speech  LABORATORY DATA:  I have reviewed the data as listed    Latest Ref Rng & Units 08/27/2021    9:10 AM 08/06/2021    9:25 AM 07/16/2021    8:28 AM  CBC  WBC 4.0 - 10.5 K/uL 8.9  9.2  8.2   Hemoglobin 12.0 - 15.0 g/dL 15.1  15.5  14.9   Hematocrit 36.0 - 46.0 % 44.4  46.1  44.6   Platelets 150 - 400 K/uL 284  282  276         Latest Ref Rng & Units 08/27/2021    9:10 AM 08/06/2021    9:25 AM 07/16/2021    8:28 AM  CMP  Glucose 70 - 99 mg/dL 213  134  147   BUN 8 - 23 mg/dL 33  27  28   Creatinine 0.44 - 1.00 mg/dL 1.48  1.49  1.45   Sodium 135 - 145 mmol/L 137  140  140    Potassium 3.5 - 5.1 mmol/L 5.0  4.8  4.9   Chloride 98 - 111 mmol/L 107  108  108   CO2 22 - 32 mmol/L $RemoveB'24  26  25   'sXCxKXtY$ Calcium 8.9 - 10.3 mg/dL 9.4  9.7  9.6   Total Protein 6.5 - 8.1 g/dL 6.5  7.1  6.6   Total Bilirubin 0.3 - 1.2 mg/dL 0.3  0.3  0.3   Alkaline Phos 38 - 126 U/L 67  77  73   AST 15 - 41 U/L $Remo'14  13  13   'NWZkz$ ALT 0 - 44 U/L $Remo'9  8  8       'dwfqc$ RADIOGRAPHIC STUDIES: I have personally reviewed the radiological images as listed and agreed with the findings in the report. No results found.    Orders Placed This Encounter  Procedures   CT Abdomen Pelvis Wo Contrast    Standing Status:   Future    Standing Expiration Date:   08/27/2022    Order Specific Question:   Preferred imaging location?    Answer:   [facility]    Order Specific Question:   Release to patient    Answer:   Immediate    Order Specific Question:   Is Oral Contrast requested for this exam?    Answer:   Yes, Per Radiology protocol   All questions were answered. The patient knows to call the clinic with any problems, questions or concerns. No barriers to learning was detected. The total time spent in the appointment was 30 minutes.     Truitt Merle, MD 08/27/2021   I, Wilburn Mylar, am acting as scribe for Truitt Merle, MD.   I have reviewed the above documentation for accuracy and completeness, and I  agree with the above.

## 2021-08-30 ENCOUNTER — Other Ambulatory Visit: Payer: Self-pay

## 2021-08-30 ENCOUNTER — Telehealth: Payer: Self-pay | Admitting: Nurse Practitioner

## 2021-08-30 NOTE — Telephone Encounter (Signed)
Left message for patient to call back and schedule Medicare Annual Wellness Visit (AWV).   Please offer to do virtually or by telephone.  Left office number and my jabber [phone].  Last AWV:01/02/2017  Please schedule at anytime with Nurse Health Advisor.

## 2021-08-31 ENCOUNTER — Telehealth: Payer: Self-pay | Admitting: Hematology

## 2021-08-31 NOTE — Telephone Encounter (Signed)
Left message with follow-up appointment per 7/21 los.

## 2021-09-07 ENCOUNTER — Other Ambulatory Visit: Payer: Self-pay

## 2021-09-17 ENCOUNTER — Inpatient Hospital Stay: Payer: Medicare PPO | Attending: Hematology

## 2021-09-17 ENCOUNTER — Other Ambulatory Visit: Payer: Self-pay

## 2021-09-17 ENCOUNTER — Inpatient Hospital Stay: Payer: Medicare PPO | Admitting: Hematology

## 2021-09-17 ENCOUNTER — Inpatient Hospital Stay: Payer: Medicare PPO

## 2021-09-17 ENCOUNTER — Ambulatory Visit: Payer: Medicare PPO

## 2021-09-17 VITALS — BP 147/69 | HR 65 | Temp 98.3°F | Resp 18 | Wt 179.5 lb

## 2021-09-17 DIAGNOSIS — Z5112 Encounter for antineoplastic immunotherapy: Secondary | ICD-10-CM | POA: Diagnosis not present

## 2021-09-17 DIAGNOSIS — C185 Malignant neoplasm of splenic flexure: Secondary | ICD-10-CM | POA: Diagnosis not present

## 2021-09-17 DIAGNOSIS — E538 Deficiency of other specified B group vitamins: Secondary | ICD-10-CM

## 2021-09-17 DIAGNOSIS — C186 Malignant neoplasm of descending colon: Secondary | ICD-10-CM

## 2021-09-17 LAB — CBC WITH DIFFERENTIAL (CANCER CENTER ONLY)
Abs Immature Granulocytes: 0.03 10*3/uL (ref 0.00–0.07)
Basophils Absolute: 0.1 10*3/uL (ref 0.0–0.1)
Basophils Relative: 2 %
Eosinophils Absolute: 0.6 10*3/uL — ABNORMAL HIGH (ref 0.0–0.5)
Eosinophils Relative: 7 %
HCT: 44.6 % (ref 36.0–46.0)
Hemoglobin: 15.2 g/dL — ABNORMAL HIGH (ref 12.0–15.0)
Immature Granulocytes: 0 %
Lymphocytes Relative: 17 %
Lymphs Abs: 1.5 10*3/uL (ref 0.7–4.0)
MCH: 31.9 pg (ref 26.0–34.0)
MCHC: 34.1 g/dL (ref 30.0–36.0)
MCV: 93.7 fL (ref 80.0–100.0)
Monocytes Absolute: 1 10*3/uL (ref 0.1–1.0)
Monocytes Relative: 11 %
Neutro Abs: 5.6 10*3/uL (ref 1.7–7.7)
Neutrophils Relative %: 63 %
Platelet Count: 278 10*3/uL (ref 150–400)
RBC: 4.76 MIL/uL (ref 3.87–5.11)
RDW: 13.1 % (ref 11.5–15.5)
WBC Count: 8.8 10*3/uL (ref 4.0–10.5)
nRBC: 0 % (ref 0.0–0.2)

## 2021-09-17 LAB — CMP (CANCER CENTER ONLY)
ALT: 9 U/L (ref 0–44)
AST: 13 U/L — ABNORMAL LOW (ref 15–41)
Albumin: 4 g/dL (ref 3.5–5.0)
Alkaline Phosphatase: 72 U/L (ref 38–126)
Anion gap: 6 (ref 5–15)
BUN: 30 mg/dL — ABNORMAL HIGH (ref 8–23)
CO2: 27 mmol/L (ref 22–32)
Calcium: 9.3 mg/dL (ref 8.9–10.3)
Chloride: 107 mmol/L (ref 98–111)
Creatinine: 1.41 mg/dL — ABNORMAL HIGH (ref 0.44–1.00)
GFR, Estimated: 37 mL/min — ABNORMAL LOW (ref >60)
Glucose, Bld: 140 mg/dL — ABNORMAL HIGH (ref 70–99)
Potassium: 5 mmol/L (ref 3.5–5.1)
Sodium: 140 mmol/L (ref 135–145)
Total Bilirubin: 0.4 mg/dL (ref 0.3–1.2)
Total Protein: 6.6 g/dL (ref 6.5–8.1)

## 2021-09-17 LAB — VITAMIN B12: Vitamin B-12: 640 pg/mL (ref 180–914)

## 2021-09-17 MED ORDER — CYANOCOBALAMIN 1000 MCG/ML IJ SOLN
1000.0000 ug | Freq: Once | INTRAMUSCULAR | Status: AC
  Administered 2021-09-17: 1000 ug via INTRAMUSCULAR

## 2021-09-17 MED ORDER — SODIUM CHLORIDE 0.9 % IV SOLN
200.0000 mg | Freq: Once | INTRAVENOUS | Status: AC
  Administered 2021-09-17: 200 mg via INTRAVENOUS
  Filled 2021-09-17: qty 8

## 2021-09-17 MED ORDER — SODIUM CHLORIDE 0.9 % IV SOLN: Freq: Once | INTRAVENOUS | Status: AC

## 2021-09-24 ENCOUNTER — Ambulatory Visit (HOSPITAL_COMMUNITY)
Admission: RE | Admit: 2021-09-24 | Discharge: 2021-09-24 | Disposition: A | Discharge status: Home / Self Care | Payer: Medicare PPO | Source: Ambulatory Visit | Attending: Hematology | Admitting: Hematology

## 2021-09-24 ENCOUNTER — Encounter (HOSPITAL_COMMUNITY): Payer: Self-pay

## 2021-09-24 DIAGNOSIS — N3289 Other specified disorders of bladder: Secondary | ICD-10-CM | POA: Diagnosis not present

## 2021-09-24 DIAGNOSIS — C186 Malignant neoplasm of descending colon: Secondary | ICD-10-CM

## 2021-09-24 DIAGNOSIS — R911 Solitary pulmonary nodule: Secondary | ICD-10-CM | POA: Insufficient documentation

## 2021-09-24 DIAGNOSIS — R918 Other nonspecific abnormal finding of lung field: Secondary | ICD-10-CM | POA: Diagnosis not present

## 2021-09-24 DIAGNOSIS — C189 Malignant neoplasm of colon, unspecified: Secondary | ICD-10-CM | POA: Diagnosis not present

## 2021-09-27 NOTE — Progress Notes (Signed)
Tay, please let patient know the nodule we are following is now smaller.   She has an appt with Dr. Burr Medico this week as well.   Thanks,  BLI  Garner Nash, DO [facility] 09/27/2021 8:36 PM

## 2021-10-01 ENCOUNTER — Inpatient Hospital Stay (HOSPITAL_BASED_OUTPATIENT_CLINIC_OR_DEPARTMENT_OTHER): Payer: Medicare PPO | Admitting: Hematology

## 2021-10-01 ENCOUNTER — Encounter: Payer: Self-pay | Admitting: Hematology

## 2021-10-01 DIAGNOSIS — C186 Malignant neoplasm of descending colon: Secondary | ICD-10-CM | POA: Diagnosis not present

## 2021-10-01 DIAGNOSIS — R911 Solitary pulmonary nodule: Secondary | ICD-10-CM

## 2021-10-01 NOTE — Progress Notes (Signed)
[place]   [phone]) [phone] Fax:(336) [phone]   Clinic Follow up Note   Patient Care Team: Nche, [name], NP as PCP - General (Internal Medicine) Fanny Skates, MD as Consulting Physician (General Surgery) Thea Silversmith, MD as Consulting Physician (Radiation Oncology) Nicholas Lose, MD as Consulting Physician (Hematology and Oncology) Garvin Fila, MD as Consulting Physician (Neurology) Consuella Lose, MD as Consulting Physician (Neurosurgery) Katy Apo, MD as Consulting Physician (Ophthalmology) Jonnie Finner, RN (Inactive) as Oncology Nurse Navigator Truitt Merle, MD as Consulting Physician (Hematology and Oncology) Leighton Ruff, MD as Consulting Physician (General Surgery)  Date of Service:  10/01/2021  I connected with Briana Ochoa on 10/01/2021 at  9:00 AM EDT by telephone visit and verified that I am speaking with the correct person using two identifiers.  I discussed the limitations, risks, security and privacy concerns of performing an evaluation and management service by telephone and the availability of in person appointments. I also discussed with the patient that there may be a patient responsible charge related to this service. The patient expressed understanding and agreed to proceed.   Other persons participating in the visit and their role in the encounter:  none   Patient's location:  Home  Provider's location:  Home   CHIEF COMPLAINT: review recent scan results, f/u of colon cancer  CURRENT THERAPY:  Surveillance -B12 injections, started 04/09/21, currently monthly  ASSESSMENT & PLAN:  Briana Ochoa is a 81 y.o. female with   1. Colon cancer in Splenic Flexure, pT3N0M0, Stage II, MSI-High -diagnosed on 04/23/20 by Colonoscopy performed for heme-positive stool and anemia. -resection with Dr Marcello Moores on 05/28/20 showed 9.7cm mass in splenic flexure with invasive adenocarcinoma, poorly differentiated,  negative margins, negative LNs.  -GuardantReveal on 07/07/20 was positive, which likely indicates microscopic disease and high risk of recurrence. -PET scan denied by her insurance, staging CT CAP w/o contrast on 08/31/20 showing no evidence of active malignancy.  -She began Xeloda and Keytruda on 09/21/20. She tolerated treatment well overall. However, Xeloda held due to increased Cr and then low EGFR from 12/04/20 - 02/09/21, and Beryle Flock was held due to taste change/mouth numbness from 12/25/20 - 02/09/21. Xeloda completed 04/2021. -PET scan 03/17/21 showed: mild FDG uptake to the LUL nodule, which has increased in size since 04/2020; no evidence of metastatic disease; hypermetabolic subcentimeter nodule of right parotid gland.  -bronchoscopy and biopsy on 04/06/21 under Dr. Valeta Harms. Cytology was negative for malignant cells.  -restaging CT CAP on 09/24/21 showed: slight decrease in size of LUL nodule; additional scattered smaller pulmonary nodules, stable; evidence of pulmonary arterial hypertension.  No evidence of cancer recurrence overall. -I reviewed the scan images myself and discussed the results with her today.  Clinically doing well, plan to stop Beryle Flock, will continue cancer surveillance.   2. Symptom Management: taste change, altered mouth sensation -she has experienced taste changes and mouth burning without ulceration. -symptoms persisted despite holding Xeloda/Keytruda and a course of steroids -she reports she had a brain MRI with Dr. Kathyrn Sheriff, who told her it appeared normal. (The images are available in PACS, dated 01/19/21) -on B12 injections, her taste has improved, but she still notes the metallic taste in her mouth.   3. B12 deficiency -found during work up for her taste change -Started B12 injection on 04/09/21, now q21d  -B12 level on 09/17/21 was up to 640, will continue monthly injection.   4. H/o DVT 04/2020 -she was found to have an elevated D-dimer  back on 04/08/20. She was found to  have DVT in the RLE. This was still present on 04/28/20 and she was started on Eliquis shortly after. -she has switched to baby aspirin.   5. H/o of Left breast DCIS, H/o Bladder cancer, H/o Melanoma in 2017 of right lower leg -Diagnosed with DCIS in 2015, S/p left lumpectomy, Adjuvant Radiation and 5 years of Anastrozole under Dr Lindi Adie. -Diagnosed with bladder cancer in 02/2018, early stage, S/p surgery. Followed by Dr. Alyson Ingles, last cystoscopy 06/09/21. -most recent mammogram from 02/11/21 was negative.     PLAN:  -CT scan reviewed with patient, no definitive evidence of recurrence. -next B12 injection in 2 weeks and continue monthly -lab and follow up in 2.5 month   No problem-specific Assessment & Plan notes found for this encounter.   SUMMARY OF ONCOLOGIC HISTORY: Oncology History Overview Note  Cancer Staging Cancer of left colon [facility]) Staging form: Colon and Rectum, AJCC 8th Edition - Pathologic stage from 05/28/2020: Stage IIA (pT3, pN0, cM0) - Signed by Truitt Merle, MD on 06/19/2020 Stage prefix: Initial diagnosis Total positive nodes: 0 Histologic grading system: 4 grade system Histologic grade (G): G3 Residual tumor (R): R0 - None  Ductal carcinoma in situ (DCIS) of left breast Staging form: Breast, AJCC 7th Edition - Clinical: Stage 0 (Tis (DCIS), N0, cM0) - Unsigned Specimen type: Core Needle Biopsy Histopathologic type: 9932 Laterality: Left Staging comments: Staged at breast conference 7.22.15  - Pathologic: Stage 0 (Tis (DCIS)(2), N0, cM0) - Unsigned Specimen type: Core Needle Biopsy Histopathologic type: 9932 Laterality: Left Tumor size (mm): 3 Multiple tumors: Yes Number of tumors: 2 Method of lymph node assessment: Clinical Method of detection of distant metastases: Clinical Residual tumor (R): R0 - None Estrogen receptor status: Positive Progesterone receptor status: Positive    Ductal carcinoma in situ (DCIS) of left breast  08/20/2013 Initial  Diagnosis   Left breast 12:00: DCIS with calcifications, yet 100%, PR 100%; 09/04/2013 second group of calcifications also biopsied proven to be DCIS ER/PR positive   09/19/2013 Surgery   Left breast lumpectomy DCIS 2 foci margins negative, 0.2 and 0.3 cm ER 100% PR 100%   10/08/2013 - 11/12/2013 Radiation Therapy   Adjuvant radiation therapy   11/12/2013 - 2020 Anti-estrogen oral therapy   Anastrozole 1 mg daily plan is for 5 years   01/09/2014 - 01/10/2014 Hospital Admission   Pipeline embolization 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 The report date is 07/30/2020.   The CancerNext-Expanded + RNAinsight gene panel offered by [employer] and includes sequencing and rearrangement analysis for the following 77 genes: AIP, ALK, APC, ATM, AXIN2, BAP1, BARD1, BLM, BMPR1A, BRCA1, BRCA2, BRIP1, CDC73, CDH1, CDK4, CDKN1B, CDKN2A, CHEK2, CTNNA1, DICER1, FANCC, FH, FLCN, GALNT12, KIF1B, LZTR1, MAX, MEN1, MET, MLH1, MSH2, MSH3, MSH6, MUTYH, NBN, NF1, NF2, NTHL1, PALB2, PHOX2B, PMS2, POT1, PRKAR1A, PTCH1, PTEN, RAD51C, RAD51D, RB1, RECQL, RET, SDHA, SDHAF2, SDHB, SDHC, SDHD, SMAD4, SMARCA4, SMARCB1, SMARCE1, STK11, SUFU, TMEM127, TP53, TSC1, TSC2, VHL and XRCC2 (sequencing and deletion/duplication); EGFR, EGLN1, HOXB13, KIT, MITF, PDGFRA, POLD1 and POLE (sequencing only); EPCAM and GREM1 (deletion/duplication only). RNA data is routinely analyzed for use in variant interpretation for all genes.   Bladder cancer ([place])  02/09/2018 Initial Diagnosis   Bladder cancer ([place])   07/30/2020 Genetic Testing   Negative genetic testing:  No pathogenic variants detected on the [employer] CancerNext-Expanded +  RNAinsight panel. The report date is 07/30/2020.   The CancerNext-Expanded + RNAinsight gene panel offered by [employer] and includes sequencing and rearrangement analysis for the  following 77 genes: AIP, ALK, APC, ATM, AXIN2, BAP1, BARD1, BLM, BMPR1A, BRCA1, BRCA2, BRIP1, CDC73, CDH1, CDK4, CDKN1B, CDKN2A, CHEK2, CTNNA1, DICER1, FANCC, FH, FLCN, GALNT12, KIF1B, LZTR1, MAX, MEN1, MET, MLH1, MSH2, MSH3, MSH6, MUTYH, NBN, NF1, NF2, NTHL1, PALB2, PHOX2B, PMS2, POT1, PRKAR1A, PTCH1, PTEN, RAD51C, RAD51D, RB1, RECQL, RET, SDHA, SDHAF2, SDHB, SDHC, SDHD, SMAD4, SMARCA4, SMARCB1, SMARCE1, STK11, SUFU, TMEM127, TP53, TSC1, TSC2, VHL and XRCC2 (sequencing and deletion/duplication); EGFR, EGLN1, HOXB13, KIT, MITF, PDGFRA, POLD1 and POLE (sequencing only); EPCAM and GREM1 (deletion/duplication only). RNA data is routinely analyzed for use in variant interpretation for all genes.   08/31/2020 Imaging   CT CAP w/o contrast  IMPRESSION: 1. No specific findings of active malignancy on today's noncontrast CT. There has been interval partial colectomy involving the tumor site with reanastomosis. Some faint stranding in the adjacent omentum is probably from scarring or fat necrosis, less likely due to early tumor given the lack of overt nodularity. Surveillance imaging is likely indicated, and PET-CT may have a role in imaging follow up. 2. Several small pulmonary nodules, in addition to a larger mixed density nodule in the left upper lobe. Surveillance is recommended. 3. Other imaging findings of potential clinical significance: Aortic Atherosclerosis (ICD10-I70.0). Coronary atherosclerosis. Mitral valve calcification. Small type 1 hiatal hernia. Splenectomy with small amount of regenerative splenic tissue as well as a venous varix arising from the splenic vein. Hyperdense left kidney lower pole exophytic homogeneous lesion, probably a complex cyst. Sigmoid colon diverticulosis. Multilevel lumbar spondylosis and degenerative disc disease.   Cancer of left colon ([place])  04/23/2020 Procedure   Upper Endoscopy and Colonoscopy by Dr Carlean Purl IMPRESSION Erythematous mucosa in the antrum.  Biopsied. - Small hiatal hernia. - Gastroesophageal flap valve classified as Hill Grade IV (no fold, wide open lumen, hiatal hernia present). - The examination was otherwise normal.   IMPRESSION - Decreased sphincter tone found on digital rectal exam. - Malignant partially obstructing tumor in the proximal descending colon. Biopsied. Tattooed. - Three 5 to 20 mm polyps in the sigmoid colon, removed piecemeal using a cold snare. Resected and retrieved. - Severe diverticulosis in the sigmoid colon. - I left several diminutive descending (proximal to tattoo), sigmoid and rectal polyps - The examination was otherwise normal on direct and retroflexion views.     04/23/2020 Initial Diagnosis   Diagnosis 1. Descending Colon Polyp, mass - ADENOCARCINOMA. 2. Sigmoid Colon Polyp, (3) - MULTIPLE FRAGMENTS OF TUBULAR ADENOMA. - MULTIPLE FRAGMENTS OF SESSILE SERRATED POLYP WITHOUT DYSPLASIA. - NO HIGH GRADE DYSPLASIA OR MALIGNANCY. Microscopic Comment 1. Dr. Vic Ripper has reviewed the case. Dr. Carlean Purl was notified on 04/24/2020. 2. Despite being left sided the fragments have features of a sessile serrated polyp, as opposed to, hyperplastic polyp.   04/27/2020 Imaging   CT AP IMPRESSION: Bulky annular soft tissue mass involving the distal transverse colon with extension into adjacent pericolonic fat, consistent with primary colon carcinoma.   No evidence of metastatic disease.   Colonic diverticulosis. No radiographic evidence of diverticulitis.   Aortic Atherosclerosis (ICD10-I70.0).     05/05/2020 Imaging   CT Chest IMPRESSION: Bulky annular soft tissue mass involving the distal transverse colon with extension into adjacent pericolonic fat, consistent with primary colon carcinoma.   No evidence of metastatic disease.   Colonic diverticulosis. No radiographic evidence of diverticulitis.   Aortic Atherosclerosis (  ICD10-I70.0).     05/28/2020 Initial Diagnosis   Cancer of left  colon ([place])   05/28/2020 Cancer Staging   Staging form: Colon and Rectum, AJCC 8th Edition - Pathologic stage from 05/28/2020: Stage IIA (pT3, pN0, cM0) - Signed by Truitt Merle, MD on 06/19/2020 Stage prefix: Initial diagnosis Total positive nodes: 0 Histologic grading system: 4 grade system Histologic grade (G): G3 Residual tumor (R): R0 - None   05/28/2020 Surgery   XI ROBOT ASSISTED RESECTION OF SPLENIC FLEXURE by Dr Marcello Moores    FINAL MICROSCOPIC DIAGNOSIS:   A. COLON, SPLENIC FLEXURE, RESECTION:  - Invasive adenocarcinoma, poorly differentiated, spanning 9.7 cm.  - Tumor invades through muscularis propria into pericolonic tissue.  - Resection margins are negative.  - Tubular adenoma (X1).  - Sessile serrate polyp without dysplasia (x2).  - Diverticulosis.  - Polypectomy scar.  - Twenty-four of twenty-four lymph nodes negative for carcinoma (0/24).  - See oncology table.    07/07/2020 Miscellaneous   Guardant Reveal ctDNA positive MSI high   07/30/2020 Genetic Testing   Negative genetic testing:  No pathogenic variants detected on the [employer] CancerNext-Expanded + RNAinsight panel. The report date is 07/30/2020.   The CancerNext-Expanded + RNAinsight gene panel offered by [employer] and includes sequencing and rearrangement analysis for the following 77 genes: AIP, ALK, APC, ATM, AXIN2, BAP1, BARD1, BLM, BMPR1A, BRCA1, BRCA2, BRIP1, CDC73, CDH1, CDK4, CDKN1B, CDKN2A, CHEK2, CTNNA1, DICER1, FANCC, FH, FLCN, GALNT12, KIF1B, LZTR1, MAX, MEN1, MET, MLH1, MSH2, MSH3, MSH6, MUTYH, NBN, NF1, NF2, NTHL1, PALB2, PHOX2B, PMS2, POT1, PRKAR1A, PTCH1, PTEN, RAD51C, RAD51D, RB1, RECQL, RET, SDHA, SDHAF2, SDHB, SDHC, SDHD, SMAD4, SMARCA4, SMARCB1, SMARCE1, STK11, SUFU, TMEM127, TP53, TSC1, TSC2, VHL and XRCC2 (sequencing and deletion/duplication); EGFR, EGLN1, HOXB13, KIT, MITF, PDGFRA, POLD1 and POLE (sequencing only); EPCAM and GREM1 (deletion/duplication only). RNA data is routinely analyzed for use  in variant interpretation for all genes.   08/31/2020 Imaging   CT CAP w/o contrast  IMPRESSION: 1. No specific findings of active malignancy on today's noncontrast CT. There has been interval partial colectomy involving the tumor site with reanastomosis. Some faint stranding in the adjacent omentum is probably from scarring or fat necrosis, less likely due to early tumor given the lack of overt nodularity. Surveillance imaging is likely indicated, and PET-CT may have a role in imaging follow up. 2. Several small pulmonary nodules, in addition to a larger mixed density nodule in the left upper lobe. Surveillance is recommended. 3. Other imaging findings of potential clinical significance: Aortic Atherosclerosis (ICD10-I70.0). Coronary atherosclerosis. Mitral valve calcification. Small type 1 hiatal hernia. Splenectomy with small amount of regenerative splenic tissue as well as a venous varix arising from the splenic vein. Hyperdense left kidney lower pole exophytic homogeneous lesion, probably a complex cyst. Sigmoid colon diverticulosis. Multilevel lumbar spondylosis and degenerative disc disease.   10/02/2020 -  Chemotherapy   Patient is on Treatment Plan : COLORECTAL Pembrolizumab q21d     03/01/2021 Imaging   EXAM: CT CHEST, ABDOMEN AND PELVIS WITHOUT CONTRAST  IMPRESSION: 1. Mixed attenuation lesion posterior left upper lobe has areas of more confluent and enlarging nodularity today. Interval progression raises concern for neoplasm. Consider PET-CT to further evaluate. 2. Stable 7 mm right lower lobe pulmonary nodule with a slightly more conspicuous 3 mm nodule in the right lower lobe. Continued close attention on follow-up recommended. 3. No evidence for metastatic disease in the abdomen or pelvis. 4. Aortic Atherosclerosis (ICD10-I70.0).   03/17/2021 PET scan   IMPRESSION:  1. Part solid nodule of the left upper lobe demonstrates mild FDG uptake, and has increased in size  when compared with prior exam dated May 05, 2020. Concerning for indolent primary lung neoplasm. 2. No evidence of metastatic disease in the chest, abdomen or pelvis. 3. 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 Recommend further evaluation with tissue sampling. 4. Aortic Atherosclerosis (ICD10-I70.0).      INTERVAL HISTORY:  Briana Ochoa was contacted to review her recent scan results. She was last seen by me on 08/27/21.  I have verified her identification with date of birth and home address.  She is clinically doing well, her taste loss overall has improved some.  Denies any pain, cough, shortness of breath, or other new symptoms.  She functions very well at home.     All other systems were reviewed with the patient and are negative.  MEDICAL HISTORY:  Past Medical History:  Diagnosis Date   Allergy    seasonal allergies   Anemia    on meds   Aortic atherosclerosis (H[facility])    Bladder cancer ([place]) 2020   Bladder tumor    Bloody diarrhea 09/29/2017   Breast cancer ([place]) 2015   Left Breast Cancer   Cataract    bilateral -sx   CKD (chronic kidney disease), stage III ([place])    DM related   Colon cancer ([place])    Diabetes mellitus type 2, diet-controlled ([place])    diet controlled   Dyspnea 09/28/2017   Dysuria    Family history of adverse reaction to anesthesia    aunt- N/V    Family history of breast cancer    Family history of kidney cancer    Family history of throat cancer    Family history of uterine cancer    Fatigue 09/28/2017   Frequency of urination    Grade I diastolic dysfunction 32/01/2481   Noted on ECHO   Gross hematuria 09/29/2017   Heart murmur    since rheumatic fever as child   Hematuria    History of cardiomegaly 02/12/2005   Mild, noted on CXR   History of CVA (cerebrovascular accident) 08/05/2014   post op cerebral angiogram, cerebral thrombosis w/ cerebral infarction;  11-02-2017  per  pt no residuals   History of malignant melanoma of skin 12/2015   s/p wide local excision right lower leg (per pt localized)   History of rheumatic fever as a child    Hyperlipidemia    on meds   Hypertension    on meds   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   Sigmoid diverticulosis    Weakness 09/28/2017   Wears dentures    upper   Wears glasses    Wears hearing aid in both ears     SURGICAL HISTORY: Past Surgical History:  Procedure Laterality Date  BREAST LUMPECTOMY Left 2015   BREAST LUMPECTOMY WITH NEEDLE LOCALIZATION Left 09/19/2013   Procedure: LEFT BREAST LUMPECTOMY WITH DOUBLE WIRE BRACKETED  NEEDLE LOCALIZATION;  Surgeon: [name], MD;  Location: [place];  Service: General;  Laterality: Left;   BRONCHIAL BIOPSY  04/06/2021   Procedure: BRONCHIAL BIOPSIES;  Surgeon: Garner Nash, DO;  Location: [facility];  Service: Pulmonary;;   BRONCHIAL BRUSHINGS  04/06/2021   Procedure: BRONCHIAL BRUSHINGS;  Surgeon: Garner Nash, DO;  Location: [facility];  Service: Pulmonary;;   CATARACT EXTRACTION W/ INTRAOCULAR LENS  IMPLANT, BILATERAL  06/2015   CESAREAN SECTION  1964   CHOLECYSTECTOMY OPEN  1970s   CYSTOSCOPY W/ URETERAL STENT PLACEMENT Left 11/07/2017   Procedure: CYSTOSCOPY WITH RETROGRADE PYELOGRAM/URETERAL STENT PLACEMENT;  Surgeon: Cleon Gustin, MD;  Location: [facility];  Service: Urology;  Laterality: Left;   CYSTOSCOPY W/ URETERAL STENT PLACEMENT Bilateral 04/27/2018   Procedure: CYSTOSCOPY WITH RETROGRADE PYELOGRAM;  Surgeon: Cleon Gustin, MD;  Location: [facility];  Service: Urology;  Laterality: Bilateral;   FINGER SURGERY     lt thumb  from dog bite   IR  NEPHROURETERAL CATH PLACE LEFT  04/28/2018   MELANOMA EXCISION Right 12/28/2015   Procedure: EXCISION MELANOMA RIGHT LOWER LEG;  Surgeon: Fanny Skates, MD;  Location: [place];  Service: General;  Laterality: Right;   RADIOLOGY WITH ANESTHESIA N/A 01/09/2014   Procedure: Embolization;  Surgeon: Consuella Lose, MD;  Location: [place];  Service: Radiology;  Laterality: N/A;   SPLENECTOMY, TOTAL  2009 approx.   splen injury due to fall off horse   TONSILLECTOMY  child   TRANSURETHRAL RESECTION OF BLADDER TUMOR N/A 11/07/2017   Procedure: TRANSURETHRAL RESECTION OF BLADDER TUMOR (TURBT), POSSIBLE STENT PLACEMENT;  Surgeon: Cleon Gustin, MD;  Location[facility];  Service: Urology;  Laterality: N/A;   TRANSURETHRAL RESECTION OF BLADDER TUMOR N/A 12/14/2017   Procedure: TRANSURETHRAL RESECTION OF BLADDER TUMOR (TURBT);  Surgeon: Cleon Gustin, MD;  Location: [facility];  Service: Urology;  Laterality: N/A;  [place] TUMOR N/A 04/27/2018   Procedure: TRANSURETHRAL RESECTION OF BLADDER TUMOR (TURBT);  Surgeon: Cleon Gustin, MD;  Location: [facility];  Service: Urology;  Laterality: N/A;  [place] ULTRASOUND  04/06/2021   Procedure: RADIAL ENDOBRONCHIAL ULTRASOUND;  Surgeon: Garner Nash, DO;  Location: [place] [facility];  Service: Pulmonary;;    I have reviewed the social history and family history with the patient and they are unchanged from previous note.  ALLERGIES:  is allergic to feraheme  [ferumoxytol].  MEDICATIONS:  Current Outpatient Medications  Medication Sig Dispense Refill   acetaminophen (TYLENOL) 500 MG tablet Take 1,000 mg by mouth daily.     glucose blood (TRUE METRIX BLOOD GLUCOSE TEST) test strip Use as instructed to test blood sugar once daily E08.638 100 each 2   lisinopril (ZESTRIL) 20 MG tablet Take 1 tablet (20 mg total) by mouth daily. 90 tablet 3   magic mouthwash w/lidocaine SOLN Take 5 mLs by mouth every 6 (six) hours as needed for mouth pain. Swish and swallow 480 mL 1   Miconazole Nitrate-Wipes (MONISTAT 7 COMPLETE THERAPY) 100-2 MG-% KIT Place 1 kit vaginally once a week. 1 kit 1   pravastatin (PRAVACHOL) 10 MG tablet TAKE 1 TABLET EVERY DAY (STOP PRAVASTATIN 20MG) 90 tablet 3  No current facility-administered medications for this visit.    PHYSICAL EXAMINATION: ECOG PERFORMANCE STATUS: 0 - Asymptomatic  There were no vitals filed for this visit. Wt Readings from Last 3 Encounters:  09/17/21 179 lb 8 oz (81.4 kg)  08/27/21 181 lb 4.8 oz (82.2 kg)  08/06/21 179 lb 14.4 oz (81.6 kg)     No vitals taken today, Exam not performed today  LABORATORY DATA:  I have reviewed the data as listed    Latest Ref Rng & Units 09/17/2021    8:59 AM 08/27/2021    9:10 AM 08/06/2021    9:25 AM  CBC  WBC 4.0 - 10.5 K/uL 8.8  8.9  9.2   Hemoglobin 12.0 - 15.0 g/dL 15.2  15.1  15.5   Hematocrit 36.0 - 46.0 % 44.6  44.4  46.1   Platelets 150 - 400 K/uL 278  284  282         Latest Ref Rng & Units 09/17/2021    8:59 AM 08/27/2021    9:10 AM 08/06/2021    9:25 AM  CMP  Glucose 70 - 99 mg/dL 140  213  134   BUN 8 - 23 mg/dL 30  33  27   Creatinine 0.44 - 1.00 mg/dL 1.41  1.48  1.49   Sodium 135 - 145 mmol/L 140  137  140   Potassium 3.5 - 5.1 mmol/L 5.0  5.0  4.8   Chloride 98 - 111 mmol/L 107  107  108   CO2 22 - 32 mmol/L _0 Calcium 8.9 - 10.3 mg/dL 9.3  9.4  9.7   Total Protein 6.5 - 8.1 g/dL 6.6  6.5  7.1   Total Bilirubin 0.3 - 1.2  mg/dL 0.4  0.3  0.3   Alkaline Phos 38 - 126 U/L 72  67  77   AST 15 - 41 U/L _1 ALT 0 - 44 U/L _2 RADIOGRAPHIC STUDIES: I have personally reviewed the radiological images as listed and agreed with the findings in the report. No results found.    No orders of the defined types were placed in this encounter.  All questions were answered. The patient knows to call the clinic with any problems, questions or concerns. No barriers to learning was detected. The total time spent in the appointment was 22 minutes.     Truitt Merle, MD 10/01/2021   I, Wilburn Mylar, am acting as scribe for Truitt Merle, MD.   I have reviewed the above documentation for accuracy and completeness, and I agree with the above.

## 2021-10-05 ENCOUNTER — Telehealth: Payer: Self-pay | Admitting: Hematology

## 2021-10-05 NOTE — Telephone Encounter (Signed)
Scheduled follow-up appointments per 8/25 los. Patient is aware.

## 2021-10-15 ENCOUNTER — Inpatient Hospital Stay: Payer: Medicare PPO | Attending: Hematology

## 2021-10-15 ENCOUNTER — Other Ambulatory Visit: Payer: Self-pay

## 2021-10-15 DIAGNOSIS — E538 Deficiency of other specified B group vitamins: Secondary | ICD-10-CM | POA: Diagnosis not present

## 2021-10-15 MED ORDER — CYANOCOBALAMIN 1000 MCG/ML IJ SOLN
1000.0000 ug | Freq: Once | INTRAMUSCULAR | Status: AC
  Administered 2021-10-15: 1000 ug via INTRAMUSCULAR
  Filled 2021-10-15: qty 1

## 2021-11-15 ENCOUNTER — Inpatient Hospital Stay: Payer: Medicare PPO | Attending: Hematology

## 2021-11-15 ENCOUNTER — Other Ambulatory Visit: Payer: Self-pay

## 2021-11-15 DIAGNOSIS — E538 Deficiency of other specified B group vitamins: Secondary | ICD-10-CM | POA: Diagnosis not present

## 2021-11-15 MED ORDER — CYANOCOBALAMIN 1000 MCG/ML IJ SOLN
1000.0000 ug | Freq: Once | INTRAMUSCULAR | Status: AC
  Administered 2021-11-15: 1000 ug via INTRAMUSCULAR
  Filled 2021-11-15: qty 1

## 2021-11-15 NOTE — Patient Instructions (Signed)
Vitamin B12 Deficiency Vitamin B12 deficiency occurs when the body does not have enough of this important vitamin. The body needs this vitamin: To make red blood cells. To make DNA. This is the genetic material inside cells. To help the nerves work properly so they can carry messages from the brain to the body. Vitamin B12 deficiency can cause health problems, such as not having enough red blood cells in the blood (anemia). This can lead to nerve damage if untreated. What are the causes? This condition may be caused by: Not eating enough foods that contain vitamin B12. Not having enough stomach acid and digestive fluids to properly absorb vitamin B12 from the food that you eat. Having certain diseases that make it hard to absorb vitamin B12. These diseases include Crohn's disease, chronic pancreatitis, and cystic fibrosis. An autoimmune disorder in which the body does not make enough of a protein (intrinsic factor) within the stomach, resulting in not enough absorption of vitamin B12. Having a surgery in which part of the stomach or small intestine is removed. Taking certain medicines that make it hard for the body to absorb vitamin B12. These include: Heartburn medicines, such as antacids and proton pump inhibitors. Some medicines that are used to treat diabetes. What increases the risk? The following factors may make you more likely to develop a vitamin B12 deficiency: Being an older adult. Eating a vegetarian or vegan diet that does not include any foods that come from animals. Eating a poor diet while you are pregnant. Taking certain medicines. Having alcoholism. What are the signs or symptoms? In some cases, there are no symptoms of this condition. If the condition leads to anemia or nerve damage, various symptoms may occur, such as: Weakness. Tiredness (fatigue). Loss of appetite. Numbness or tingling in your hands and feet. Redness and burning of the tongue. Depression,  confusion, or memory problems. Trouble walking. If anemia is severe, symptoms can include: Shortness of breath. Dizziness. Rapid heart rate. How is this diagnosed? This condition may be diagnosed with a blood test to measure the level of vitamin B12 in your blood. You may also have other tests, including: A group of tests that measure certain characteristics of blood cells (complete blood count, CBC). A blood test to measure intrinsic factor. A procedure where a thin tube with a camera on the end is used to look into your stomach or intestines (endoscopy). Other tests may be needed to discover the cause of the deficiency. How is this treated? Treatment for this condition depends on the cause. This condition may be treated by: Changing your eating and drinking habits, such as: Eating more foods that contain vitamin B12. Drinking less alcohol or no alcohol. Getting vitamin B12 injections. Taking vitamin B12 supplements by mouth (orally). Your health care provider will tell you which dose is best for you. Follow these instructions at home: Eating and drinking  Include foods in your diet that come from animals and contain a lot of vitamin B12. These include: Meats and poultry. This includes beef, pork, chicken, turkey, and organ meats, such as liver. Seafood. This includes clams, rainbow trout, salmon, tuna, and haddock. Eggs. Dairy foods such as milk, yogurt, and cheese. Eat foods that have vitamin B12 added to them (are fortified), such as ready-to-eat breakfast cereals. Check the label on the package to see if a food is fortified. The items listed above may not be a complete list of foods and beverages you can eat and drink. Contact a dietitian for   more information. Alcohol use Do not drink alcohol if: Your health care provider tells you not to drink. You are pregnant, may be pregnant, or are planning to become pregnant. If you drink alcohol: Limit how much you have to: 0-1 drink a  day for women. 0-2 drinks a day for men. Know how much alcohol is in your drink. In the [place] one drink equals one 12 oz bottle of beer (355 mL), one 5 oz glass of wine (148 mL), or one 1 oz glass of hard liquor (44 mL). General instructions Get vitamin B12 injections if told to by your health care provider. Take supplements only as told by your health care provider. Follow the directions carefully. Keep all follow-up visits. This is important. Contact a health care provider if: Your symptoms come back. Your symptoms get worse or do not improve with treatment. Get help right away: You develop shortness of breath. You have a rapid heart rate. You have chest pain. You become dizzy or you faint. These symptoms may be an emergency. Get help right away. Call 911. Do not wait to see if the symptoms will go away. Do not drive yourself to the hospital. Summary Vitamin B12 deficiency occurs when the body does not have enough of this important vitamin. Common causes include not eating enough foods that contain vitamin B12, not being able to absorb vitamin B12 from the food that you eat, having a surgery in which part of the stomach or small intestine is removed, or taking certain medicines. Eat foods that have vitamin B12 in them. Treatment may include making a change in the way you eat and drink, getting vitamin B12 injections, or taking vitamin B12 supplements. This information is not intended to replace advice given to you by your health care provider. Make sure you discuss any questions you have with your health care provider. Document Revised: 09/18/2020 Document Reviewed: 09/18/2020 [employer] Patient Education  2023 [employer]  

## 2021-12-06 DIAGNOSIS — E113291 Type 2 diabetes mellitus with mild nonproliferative diabetic retinopathy without macular edema, right eye: Secondary | ICD-10-CM | POA: Diagnosis not present

## 2021-12-06 DIAGNOSIS — Z961 Presence of intraocular lens: Secondary | ICD-10-CM | POA: Diagnosis not present

## 2021-12-06 DIAGNOSIS — H5213 Myopia, bilateral: Secondary | ICD-10-CM | POA: Diagnosis not present

## 2021-12-06 DIAGNOSIS — H35373 Puckering of macula, bilateral: Secondary | ICD-10-CM | POA: Diagnosis not present

## 2021-12-16 ENCOUNTER — Inpatient Hospital Stay: Payer: Medicare PPO | Attending: Hematology | Admitting: Hematology

## 2021-12-16 ENCOUNTER — Inpatient Hospital Stay: Payer: Medicare PPO

## 2021-12-16 ENCOUNTER — Other Ambulatory Visit: Payer: Self-pay

## 2021-12-16 ENCOUNTER — Encounter: Payer: Self-pay | Admitting: Hematology

## 2021-12-16 DIAGNOSIS — Z85038 Personal history of other malignant neoplasm of large intestine: Secondary | ICD-10-CM | POA: Diagnosis not present

## 2021-12-16 DIAGNOSIS — Z86000 Personal history of in-situ neoplasm of breast: Secondary | ICD-10-CM | POA: Insufficient documentation

## 2021-12-16 DIAGNOSIS — C186 Malignant neoplasm of descending colon: Secondary | ICD-10-CM | POA: Diagnosis not present

## 2021-12-16 DIAGNOSIS — Z79811 Long term (current) use of aromatase inhibitors: Secondary | ICD-10-CM | POA: Insufficient documentation

## 2021-12-16 DIAGNOSIS — Z8551 Personal history of malignant neoplasm of bladder: Secondary | ICD-10-CM | POA: Diagnosis not present

## 2021-12-16 DIAGNOSIS — Z85820 Personal history of malignant melanoma of skin: Secondary | ICD-10-CM | POA: Diagnosis not present

## 2021-12-16 DIAGNOSIS — E538 Deficiency of other specified B group vitamins: Secondary | ICD-10-CM | POA: Diagnosis not present

## 2021-12-16 LAB — CBC WITH DIFFERENTIAL (CANCER CENTER ONLY)
Abs Immature Granulocytes: 0.05 10*3/uL (ref 0.00–0.07)
Basophils Absolute: 0.1 10*3/uL (ref 0.0–0.1)
Basophils Relative: 1 %
Eosinophils Absolute: 1.3 10*3/uL — ABNORMAL HIGH (ref 0.0–0.5)
Eosinophils Relative: 14 %
HCT: 43.5 % (ref 36.0–46.0)
Hemoglobin: 14.4 g/dL (ref 12.0–15.0)
Immature Granulocytes: 1 %
Lymphocytes Relative: 22 %
Lymphs Abs: 2.1 10*3/uL (ref 0.7–4.0)
MCH: 31.4 pg (ref 26.0–34.0)
MCHC: 33.1 g/dL (ref 30.0–36.0)
MCV: 95 fL (ref 80.0–100.0)
Monocytes Absolute: 1.1 10*3/uL — ABNORMAL HIGH (ref 0.1–1.0)
Monocytes Relative: 12 %
Neutro Abs: 5 10*3/uL (ref 1.7–7.7)
Neutrophils Relative %: 50 %
Platelet Count: 333 10*3/uL (ref 150–400)
RBC: 4.58 MIL/uL (ref 3.87–5.11)
RDW: 13.5 % (ref 11.5–15.5)
WBC Count: 9.7 10*3/uL (ref 4.0–10.5)
nRBC: 0 % (ref 0.0–0.2)

## 2021-12-16 LAB — CMP (CANCER CENTER ONLY)
ALT: 9 U/L (ref 0–44)
AST: 13 U/L — ABNORMAL LOW (ref 15–41)
Albumin: 3.8 g/dL (ref 3.5–5.0)
Alkaline Phosphatase: 78 U/L (ref 38–126)
Anion gap: 7 (ref 5–15)
BUN: 34 mg/dL — ABNORMAL HIGH (ref 8–23)
CO2: 27 mmol/L (ref 22–32)
Calcium: 9.2 mg/dL (ref 8.9–10.3)
Chloride: 107 mmol/L (ref 98–111)
Creatinine: 1.58 mg/dL — ABNORMAL HIGH (ref 0.44–1.00)
GFR, Estimated: 33 mL/min — ABNORMAL LOW (ref >60)
Glucose, Bld: 92 mg/dL (ref 70–99)
Potassium: 4.7 mmol/L (ref 3.5–5.1)
Sodium: 141 mmol/L (ref 135–145)
Total Bilirubin: 0.3 mg/dL (ref 0.3–1.2)
Total Protein: 6.9 g/dL (ref 6.5–8.1)

## 2021-12-16 LAB — VITAMIN B12: Vitamin B-12: 652 pg/mL (ref 180–914)

## 2021-12-16 MED ORDER — CYANOCOBALAMIN 1000 MCG/ML IJ SOLN
1000.0000 ug | Freq: Once | INTRAMUSCULAR | Status: AC
  Administered 2021-12-16: 1000 ug via INTRAMUSCULAR
  Filled 2021-12-16: qty 1

## 2021-12-16 NOTE — Progress Notes (Signed)
[place]   Telephone:(336) [phone] Fax:(336) [phone]   Clinic Follow up Note   Patient Care Team: Truitt Merle, MD as PCP - General (Hematology) Nicholas Lose, MD as Consulting Physician (Hematology and Oncology) Garvin Fila, MD as Consulting Physician (Neurology) Consuella Lose, MD as Consulting Physician (Neurosurgery) Katy Apo, MD as Consulting Physician (Ophthalmology) Truitt Merle, MD as Consulting Physician (Hematology and Oncology) Leighton Ruff, MD as Consulting Physician (General Surgery)  Date of Service:  12/16/2021  CHIEF COMPLAINT: f/u of colon cancer  CURRENT THERAPY:  Surveillance -B12 injections, started 04/09/21, currently monthly  ASSESSMENT & PLAN:  Briana Ochoa is a 81 y.o. female with   1. Colon cancer in Splenic Flexure, pT3N0M0, Stage II, MSI-High -diagnosed on 04/23/20 by Colonoscopy performed for heme-positive stool and anemia. -resection with Dr Marcello Moores on 05/28/20 showed 9.7cm mass in splenic flexure with invasive adenocarcinoma, poorly differentiated, negative margins, negative LNs.  -GuardantReveal on 07/07/20 was positive -staging CT CAP w/o contrast on 08/31/20 showed NED. -She began Xeloda and Keytruda on 09/21/20. Xeloda held due to increased Cr and then low EGFR from 12/04/20 - 02/09/21, and Beryle Flock was held due to taste change/mouth numbness from 12/25/20 - 02/09/21. Completed Xeloda in 04/2021 and Keytruda on 09/17/21. -restaging CT CAP on 09/24/21 showed NED. Plan for repeat in 03/2022; I ordered today. -she is clinically doing well. Labs reviewed, overall WNL. Physical exam was unremarkable. There is no clinical concern for recurrence. -she has not had surveillance colonoscopy since initial diagnosis, so she is now overdue. I recommended one more than stop given her age.   2. Symptom Management: taste change, altered mouth sensation -improving slowly off treatment   3. B12 deficiency -found during work up for her taste  change -Started B12 injection on 04/09/21, now q21d  -B12 level on 09/17/21 was up to 640, will continue monthly injection.   4. H/o of Left breast DCIS, H/o Bladder cancer, H/o Melanoma in 2017 of right lower leg -h/o DCIS in 2015, S/p left lumpectomy, Adjuvant Radiation and 5 years of Anastrozole under Dr Lindi Adie. -h/o bladder cancer in 02/2018, early stage, S/p surgery. Followed by Dr. Alyson Ingles, next cystoscopy 12/22/21. -most recent mammogram from 02/11/21 was negative.     PLAN:  -proceed with B12 injection today then switch to oral B12 1083mg daily -f/u in 3 months, with lab and CT several days before   No problem-specific Assessment & Plan notes found for this encounter.   SUMMARY OF ONCOLOGIC HISTORY: Oncology History Overview Note  Cancer Staging Cancer of left colon ([facility] Staging form: Colon and Rectum, AJCC 8th Edition - Pathologic stage from 05/28/2020: Stage IIA (pT3, pN0, cM0) - Signed by FTruitt Merle MD on 06/19/2020 Stage prefix: Initial diagnosis Total positive nodes: 0 Histologic grading system: 4 grade system Histologic grade (G): G3 Residual tumor (R): R0 - None  Ductal carcinoma in situ (DCIS) of left breast Staging form: Breast, AJCC 7th Edition - Clinical: Stage 0 (Tis (DCIS), N0, cM0) - Unsigned Specimen type: Core Needle Biopsy Histopathologic type: 9932 Laterality: Left Staging comments: Staged at breast conference 7.22.15  - Pathologic: Stage 0 (Tis (DCIS)(2), N0, cM0) - Unsigned Specimen type: Core Needle Biopsy Histopathologic type: 9932 Laterality: Left Tumor size (mm): 3 Multiple tumors: Yes Number of tumors: 2 Method of lymph node assessment: Clinical Method of detection of distant metastases: Clinical Residual tumor (R): R0 - None Estrogen receptor status: Positive Progesterone receptor status: Positive    Ductal carcinoma in situ (DCIS) of  left breast  08/20/2013 Initial Diagnosis   Left breast 12:00: DCIS with calcifications, yet 100%, PR  100%; 09/04/2013 second group of calcifications also biopsied proven to be DCIS ER/PR positive   09/19/2013 Surgery   Left breast lumpectomy DCIS 2 foci margins negative, 0.2 and 0.3 cm ER 100% PR 100%   10/08/2013 - 11/12/2013 Radiation Therapy   Adjuvant radiation therapy   11/12/2013 - 2020 Anti-estrogen oral therapy   Anastrozole 1 mg daily plan is for 5 years   01/09/2014 - 01/10/2014 Hospital Admission   Pipeline embolization 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 The report date is 07/30/2020.   The CancerNext-Expanded + RNAinsight gene panel offered by [employer] and includes sequencing and rearrangement analysis for the following 77 genes: AIP, ALK, APC, ATM, AXIN2, BAP1, BARD1, BLM, BMPR1A, BRCA1, BRCA2, BRIP1, CDC73, CDH1, CDK4, CDKN1B, CDKN2A, CHEK2, CTNNA1, DICER1, FANCC, FH, FLCN, GALNT12, KIF1B, LZTR1, MAX, MEN1, MET, MLH1, MSH2, MSH3, MSH6, MUTYH, NBN, NF1, NF2, NTHL1, PALB2, PHOX2B, PMS2, POT1, PRKAR1A, PTCH1, PTEN, RAD51C, RAD51D, RB1, RECQL, RET, SDHA, SDHAF2, SDHB, SDHC, SDHD, SMAD4, SMARCA4, SMARCB1, SMARCE1, STK11, SUFU, TMEM127, TP53, TSC1, TSC2, VHL and XRCC2 (sequencing and deletion/duplication); EGFR, EGLN1, HOXB13, KIT, MITF, PDGFRA, POLD1 and POLE (sequencing only); EPCAM and GREM1 (deletion/duplication only). RNA data is routinely analyzed for use in variant interpretation for all genes.   Bladder cancer ([place])  02/09/2018 Initial Diagnosis   Bladder cancer ([place])   07/30/2020 Genetic Testing   Negative genetic testing:  No pathogenic variants detected on the [employer] CancerNext-Expanded + RNAinsight panel. The report date is 07/30/2020.   The CancerNext-Expanded + RNAinsight gene panel offered by [employer] and includes sequencing and rearrangement analysis for the following 77 genes: AIP, ALK, APC, ATM, AXIN2, BAP1, BARD1, BLM, BMPR1A,  BRCA1, BRCA2, BRIP1, CDC73, CDH1, CDK4, CDKN1B, CDKN2A, CHEK2, CTNNA1, DICER1, FANCC, FH, FLCN, GALNT12, KIF1B, LZTR1, MAX, MEN1, MET, MLH1, MSH2, MSH3, MSH6, MUTYH, NBN, NF1, NF2, NTHL1, PALB2, PHOX2B, PMS2, POT1, PRKAR1A, PTCH1, PTEN, RAD51C, RAD51D, RB1, RECQL, RET, SDHA, SDHAF2, SDHB, SDHC, SDHD, SMAD4, SMARCA4, SMARCB1, SMARCE1, STK11, SUFU, TMEM127, TP53, TSC1, TSC2, VHL and XRCC2 (sequencing and deletion/duplication); EGFR, EGLN1, HOXB13, KIT, MITF, PDGFRA, POLD1 and POLE (sequencing only); EPCAM and GREM1 (deletion/duplication only). RNA data is routinely analyzed for use in variant interpretation for all genes.   08/31/2020 Imaging   CT CAP w/o contrast  IMPRESSION: 1. No specific findings of active malignancy on today's noncontrast CT. There has been interval partial colectomy involving the tumor site with reanastomosis. Some faint stranding in the adjacent omentum is probably from scarring or fat necrosis, less likely due to early tumor given the lack of overt nodularity. Surveillance imaging is likely indicated, and PET-CT may have a role in imaging follow up. 2. Several small pulmonary nodules, in addition to a larger mixed density nodule in the left upper lobe. Surveillance is recommended. 3. Other imaging findings of potential clinical significance: Aortic Atherosclerosis (ICD10-I70.0). Coronary atherosclerosis. Mitral valve calcification. Small type 1 hiatal hernia. Splenectomy with small amount of regenerative splenic tissue as well as a venous varix arising from the splenic vein. Hyperdense left kidney lower pole exophytic homogeneous lesion, probably a complex cyst. Sigmoid colon diverticulosis. Multilevel lumbar spondylosis and degenerative disc disease.   Cancer of left colon ([place])  04/23/2020 Procedure   Upper Endoscopy and Colonoscopy by Dr Carlean Purl IMPRESSION Erythematous mucosa in the antrum.  Biopsied. - Small hiatal hernia. - Gastroesophageal flap valve classified  as [place] Grade IV (no fold, wide open lumen, hiatal hernia present). - The examination was otherwise normal.   IMPRESSION - Decreased sphincter tone found on digital rectal exam. - Malignant partially obstructing tumor in the proximal descending colon. Biopsied. Tattooed. - Three 5 to 20 mm polyps in the sigmoid colon, removed piecemeal using a cold snare. Resected and retrieved. - Severe diverticulosis in the sigmoid colon. - I left several diminutive descending (proximal to tattoo), sigmoid and rectal polyps - The examination was otherwise normal on direct and retroflexion views.     04/23/2020 Initial Diagnosis   Diagnosis 1. Descending Colon Polyp, mass - ADENOCARCINOMA. 2. Sigmoid Colon Polyp, (3) - MULTIPLE FRAGMENTS OF TUBULAR ADENOMA. - MULTIPLE FRAGMENTS OF SESSILE SERRATED POLYP WITHOUT DYSPLASIA. - NO HIGH GRADE DYSPLASIA OR MALIGNANCY. Microscopic Comment 1. Dr. Vic Ripper has reviewed the case. Dr. Carlean Purl was notified on 04/24/2020. 2. Despite being left sided the fragments have features of a sessile serrated polyp, as opposed to, hyperplastic polyp.   04/27/2020 Imaging   CT AP IMPRESSION: Bulky annular soft tissue mass involving the distal transverse colon with extension into adjacent pericolonic fat, consistent with primary colon carcinoma.   No evidence of metastatic disease.   Colonic diverticulosis. No radiographic evidence of diverticulitis.   Aortic Atherosclerosis (ICD10-I70.0).     05/05/2020 Imaging   CT Chest IMPRESSION: Bulky annular soft tissue mass involving the distal transverse colon with extension into adjacent pericolonic fat, consistent with primary colon carcinoma.   No evidence of metastatic disease.   Colonic diverticulosis. No radiographic evidence of diverticulitis.   Aortic Atherosclerosis (ICD10-I70.0).     05/28/2020 Initial Diagnosis   Cancer of left colon ([place])   05/28/2020 Cancer Staging   Staging form: Colon and Rectum,  AJCC 8th Edition - Pathologic stage from 05/28/2020: Stage IIA (pT3, pN0, cM0) - Signed by Truitt Merle, MD on 06/19/2020 Stage prefix: Initial diagnosis Total positive nodes: 0 Histologic grading system: 4 grade system Histologic grade (G): G3 Residual tumor (R): R0 - None   05/28/2020 Surgery   XI ROBOT ASSISTED RESECTION OF SPLENIC FLEXURE by Dr Marcello Moores    FINAL MICROSCOPIC DIAGNOSIS:   A. COLON, SPLENIC FLEXURE, RESECTION:  - Invasive adenocarcinoma, poorly differentiated, spanning 9.7 cm.  - Tumor invades through muscularis propria into pericolonic tissue.  - Resection margins are negative.  - Tubular adenoma (X1).  - Sessile serrate polyp without dysplasia (x2).  - Diverticulosis.  - Polypectomy scar.  - Twenty-four of twenty-four lymph nodes negative for carcinoma (0/24).  - See oncology table.    07/07/2020 Miscellaneous   Guardant Reveal ctDNA positive MSI high   07/30/2020 Genetic Testing   Negative genetic testing:  No pathogenic variants detected on the [employer] CancerNext-Expanded + RNAinsight panel. The report date is 07/30/2020.   The CancerNext-Expanded + RNAinsight gene panel offered by [employer] and includes sequencing and rearrangement analysis for the following 77 genes: AIP, ALK, APC, ATM, AXIN2, BAP1, BARD1, BLM, BMPR1A, BRCA1, BRCA2, BRIP1, CDC73, CDH1, CDK4, CDKN1B, CDKN2A, CHEK2, CTNNA1, DICER1, FANCC, FH, FLCN, GALNT12, KIF1B, LZTR1, MAX, MEN1, MET, MLH1, MSH2, MSH3, MSH6, MUTYH, NBN, NF1, NF2, NTHL1, PALB2, PHOX2B, PMS2, POT1, PRKAR1A, PTCH1, PTEN, RAD51C, RAD51D, RB1, RECQL, RET, SDHA, SDHAF2, SDHB, SDHC, SDHD, SMAD4, SMARCA4, SMARCB1, SMARCE1, STK11, SUFU, TMEM127, TP53, TSC1, TSC2, VHL and XRCC2 (sequencing and deletion/duplication); EGFR, EGLN1, HOXB13, KIT, MITF, PDGFRA, POLD1 and POLE (sequencing only); EPCAM and GREM1 (deletion/duplication only). RNA data  is routinely analyzed for use in variant interpretation for all genes.   08/31/2020 Imaging   CT CAP  w/o contrast  IMPRESSION: 1. No specific findings of active malignancy on today's noncontrast CT. There has been interval partial colectomy involving the tumor site with reanastomosis. Some faint stranding in the adjacent omentum is probably from scarring or fat necrosis, less likely due to early tumor given the lack of overt nodularity. Surveillance imaging is likely indicated, and PET-CT may have a role in imaging follow up. 2. Several small pulmonary nodules, in addition to a larger mixed density nodule in the left upper lobe. Surveillance is recommended. 3. Other imaging findings of potential clinical significance: Aortic Atherosclerosis (ICD10-I70.0). Coronary atherosclerosis. Mitral valve calcification. Small type 1 hiatal hernia. Splenectomy with small amount of regenerative splenic tissue as well as a venous varix arising from the splenic vein. Hyperdense left kidney lower pole exophytic homogeneous lesion, probably a complex cyst. Sigmoid colon diverticulosis. Multilevel lumbar spondylosis and degenerative disc disease.   10/02/2020 - 09/17/2021 Chemotherapy   Patient is on Treatment Plan : COLORECTAL Pembrolizumab q21d     03/01/2021 Imaging   EXAM: CT CHEST, ABDOMEN AND PELVIS WITHOUT CONTRAST  IMPRESSION: 1. Mixed attenuation lesion posterior left upper lobe has areas of more confluent and enlarging nodularity today. Interval progression raises concern for neoplasm. Consider PET-CT to further evaluate. 2. Stable 7 mm right lower lobe pulmonary nodule with a slightly more conspicuous 3 mm nodule in the right lower lobe. Continued close attention on follow-up recommended. 3. No evidence for metastatic disease in the abdomen or pelvis. 4. Aortic Atherosclerosis (ICD10-I70.0).   03/17/2021 PET scan   IMPRESSION: 1. Part solid nodule of the left upper lobe demonstrates mild FDG uptake, and has increased in size when compared with prior exam dated May 05, 2020. Concerning  for indolent primary lung neoplasm. 2. No evidence of metastatic disease in the chest, abdomen or pelvis. 3. 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 Recommend further evaluation with tissue sampling. 4. Aortic Atherosclerosis (ICD10-I70.0).      INTERVAL HISTORY:  Briana Ochoa is here for a follow up of colon cancer. She was last seen by me on 10/01/21. She presents to the clinic alone. She reports her taste is returning to normal. She notes the strange mouth feeling is only occasional now. She reports her BM fluctuate between diarrhea, constipation, and normal.   All other systems were reviewed with the patient and are negative.  MEDICAL HISTORY:  Past Medical History:  Diagnosis Date   Allergy    seasonal allergies   Anemia    on meds   Aortic atherosclerosis (HCC)    Bladder cancer ([place]) 2020   Bladder tumor    Bloody diarrhea 09/29/2017   Breast cancer ([place]) 2015   Left Breast Cancer   Cataract    bilateral -sx   CKD (chronic kidney disease), stage III (HCC)    DM related   Colon cancer ([place])    Diabetes mellitus type 2, diet-controlled ([place])    diet controlled   Dyspnea 09/28/2017   Dysuria    Family history of adverse reaction to anesthesia    aunt- N/V    Family history of breast cancer    Family history of kidney cancer    Family history of throat cancer    Family history of uterine cancer    Fatigue 09/28/2017   Frequency of urination    Grade I diastolic dysfunction  08/06/2014   Noted on ECHO   Gross hematuria 09/29/2017   Heart murmur    since rheumatic fever as child   Hematuria    History of cardiomegaly 02/12/2005   Mild, noted on CXR   History of CVA (cerebrovascular accident) 08/05/2014   post op cerebral angiogram, cerebral thrombosis w/ cerebral infarction;  11-02-2017  per pt no residuals   History of malignant melanoma of skin 12/2015   s/p wide local excision right lower leg  (per pt localized)   History of rheumatic fever as a child    Hyperlipidemia    on meds   Hypertension    on meds   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   Sigmoid diverticulosis    Weakness 09/28/2017   Wears dentures    upper   Wears glasses    Wears hearing aid in both ears     SURGICAL HISTORY: Past Surgical History:  Procedure Laterality Date   BREAST LUMPECTOMY Left 2015   BREAST LUMPECTOMY WITH NEEDLE LOCALIZATION Left 09/19/2013   Procedure: LEFT BREAST LUMPECTOMY WITH DOUBLE WIRE BRACKETED  NEEDLE LOCALIZATION;  Surgeon: [name], MD;  Location: [place];  Service: General;  Laterality: Left;   BRONCHIAL BIOPSY  04/06/2021   Procedure: BRONCHIAL BIOPSIES;  Surgeon: Garner Nash, DO;  Location: [place] [facility];  Service: Pulmonary;;   BRONCHIAL BRUSHINGS  04/06/2021   Procedure: BRONCHIAL BRUSHINGS;  Surgeon: Garner Nash, DO;  Location: [place] [facility];  Service: Pulmonary;;   CATARACT EXTRACTION W/ INTRAOCULAR LENS  IMPLANT, BILATERAL  06/2015   CESAREAN SECTION  1964   CHOLECYSTECTOMY OPEN  1970s   CYSTOSCOPY W/ URETERAL STENT PLACEMENT Left 11/07/2017   Procedure: CYSTOSCOPY WITH RETROGRADE PYELOGRAM/URETERAL STENT PLACEMENT;  Surgeon: Cleon Gustin, MD;  Location: [facility];  Service: Urology;  Laterality: Left;   CYSTOSCOPY W/ URETERAL STENT PLACEMENT Bilateral 04/27/2018    Procedure: CYSTOSCOPY WITH RETROGRADE PYELOGRAM;  Surgeon: Cleon Gustin, MD;  Location: [facility];  Service: Urology;  Laterality: Bilateral;   FINGER SURGERY     lt thumb  from dog bite   IR  NEPHROURETERAL CATH PLACE LEFT  04/28/2018   MELANOMA EXCISION Right 12/28/2015   Procedure: EXCISION MELANOMA RIGHT LOWER LEG;  Surgeon: Fanny Skates, MD;  Location: [place];  Service: General;  Laterality: Right;   RADIOLOGY WITH ANESTHESIA N/A 01/09/2014   Procedure: Embolization;  Surgeon: Consuella Lose, MD;  Location: [place];  Service: Radiology;  Laterality: N/A;   SPLENECTOMY, TOTAL  2009 approx.   splen injury due to fall off horse   TONSILLECTOMY  child   TRANSURETHRAL RESECTION OF BLADDER TUMOR N/A 11/07/2017   Procedure: TRANSURETHRAL RESECTION OF BLADDER TUMOR (TURBT), POSSIBLE STENT PLACEMENT;  Surgeon: Cleon Gustin, MD;  Location: [facility];  Service: Urology;  Laterality: N/A;  TRANSURETHRAL RESECTION OF BLADDER TUMOR N/A 12/14/2017   Procedure: TRANSURETHRAL RESECTION OF BLADDER TUMOR (TURBT);  Surgeon: Cleon Gustin, MD;  Location: [facility];  Service: Urology;  Laterality: N/A;  [place] TUMOR N/A 04/27/2018   Procedure: TRANSURETHRAL RESECTION OF BLADDER TUMOR (TURBT);  Surgeon: Cleon Gustin, MD;  Location: [facility];  Service: Urology;  Laterality: N/A;  [place] ULTRASOUND  04/06/2021   Procedure: RADIAL ENDOBRONCHIAL ULTRASOUND;  Surgeon: Garner Nash, DO;  Location: [place] [facility];  Service: Pulmonary;;    I have reviewed the social history and family history with the patient and they are unchanged from previous note.  ALLERGIES:  is allergic to feraheme [ferumoxytol].  MEDICATIONS:  Current Outpatient Medications  Medication Sig Dispense Refill   acetaminophen (TYLENOL) 500 MG  tablet Take 1,000 mg by mouth daily.     glucose blood (TRUE METRIX BLOOD GLUCOSE TEST) test strip Use as instructed to test blood sugar once daily E08.638 100 each 2   lisinopril (ZESTRIL) 20 MG tablet Take 1 tablet (20 mg total) by mouth daily. 90 tablet 3   magic mouthwash w/lidocaine SOLN Take 5 mLs by mouth every 6 (six) hours as needed for mouth pain. Swish and swallow 480 mL 1   Miconazole Nitrate-Wipes (MONISTAT 7 COMPLETE THERAPY) 100-2 MG-% KIT Place 1 kit vaginally once a week. 1 kit 1   pravastatin (PRAVACHOL) 10 MG tablet TAKE 1 TABLET EVERY DAY (STOP PRAVASTATIN 20MG) 90 tablet 3   No current facility-administered medications for this visit.    PHYSICAL EXAMINATION: ECOG PERFORMANCE STATUS: 0 - Asymptomatic  There were no vitals filed for this visit. Wt Readings from Last 3 Encounters:  09/17/21 179 lb 8 oz (81.4 kg)  08/27/21 181 lb 4.8 oz (82.2 kg)  08/06/21 179 lb 14.4 oz (81.6 kg)     GENERAL:alert, no distress and comfortable SKIN: skin color, texture, turgor are normal, no rashes or significant lesions EYES: normal, Conjunctiva are pink and non-injected, sclera clear  NECK: supple, thyroid normal size, non-tender, without nodularity LYMPH:  no palpable lymphadenopathy in the cervical, axillary  LUNGS: clear to auscultation and percussion with normal breathing effort HEART: regular rate & rhythm and no murmurs and no lower extremity edema ABDOMEN:abdomen soft, non-tender and normal bowel sounds Musculoskeletal:no cyanosis of digits and no clubbing  NEURO: alert & oriented x 3 with fluent speech, no focal motor/sensory deficits  LABORATORY DATA:  I have reviewed the data as listed    Latest Ref Rng & Units 12/16/2021    1:44 PM 09/17/2021    8:59 AM 08/27/2021    9:10 AM  CBC  WBC 4.0 - 10.5 K/uL 9.7  8.8  8.9   Hemoglobin 12.0 - 15.0 g/dL 14.4  15.2  15.1   Hematocrit 36.0 - 46.0 % 43.5  44.6  44.4   Platelets 150 - 400 K/uL 333  278  284         Latest  Ref Rng & Units 12/16/2021    1:44 PM 09/17/2021    8:59 AM 08/27/2021    9:10 AM  CMP  Glucose 70 - 99 mg/dL 92  140  213   BUN 8 - 23 mg/dL 34  30  33   Creatinine 0.44 - 1.00 mg/dL 1.58  1.41  1.48   Sodium 135 - 145 mmol/L 141  140  137   Potassium 3.5 -  5.1 mmol/L 4.7  5.0  5.0   Chloride 98 - 111 mmol/L 107  107  107   CO2 22 - 32 mmol/L _0 Calcium 8.9 - 10.3 mg/dL 9.2  9.3  9.4   Total Protein 6.5 - 8.1 g/dL 6.9  6.6  6.5   Total Bilirubin 0.3 - 1.2 mg/dL 0.3  0.4  0.3   Alkaline Phos 38 - 126 U/L 78  72  67   AST 15 - 41 U/L _1 ALT 0 - 44 U/L _2 RADIOGRAPHIC STUDIES: I have personally reviewed the radiological images as listed and agreed with the findings in the report. No results found.    Orders Placed This Encounter  Procedures   CT CHEST ABDOMEN PELVIS W CONTRAST    Standing Status:   Future    Standing Expiration Date:   12/17/2022    Order Specific Question:   Preferred imaging location?    Answer:   [facility]    Order Specific Question:   Release to patient    Answer:   Immediate    Order Specific Question:   Is Oral Contrast requested for this exam?    Answer:   Yes, Per Radiology protocol   All questions were answered. The patient knows to call the clinic with any problems, questions or concerns. No barriers to learning was detected. The total time spent in the appointment was 30 minutes.     Truitt Merle, MD 12/16/2021   I, Wilburn Mylar, am acting as scribe for Truitt Merle, MD.   I have reviewed the above documentation for accuracy and completeness, and I agree with the above.

## 2021-12-17 ENCOUNTER — Telehealth: Payer: Self-pay | Admitting: Hematology

## 2021-12-17 NOTE — Telephone Encounter (Signed)
Called patient per 11/9 los. Patient notified.

## 2021-12-22 ENCOUNTER — Encounter: Payer: Self-pay | Admitting: Urology

## 2021-12-22 ENCOUNTER — Ambulatory Visit (INDEPENDENT_AMBULATORY_CARE_PROVIDER_SITE_OTHER): Payer: Medicare PPO | Admitting: Urology

## 2021-12-22 VITALS — BP 152/81 | HR 94

## 2021-12-22 DIAGNOSIS — C679 Malignant neoplasm of bladder, unspecified: Secondary | ICD-10-CM | POA: Diagnosis not present

## 2021-12-22 DIAGNOSIS — Z8551 Personal history of malignant neoplasm of bladder: Secondary | ICD-10-CM

## 2021-12-22 MED ORDER — CIPROFLOXACIN HCL 500 MG PO TABS
500.0000 mg | ORAL_TABLET | Freq: Once | ORAL | Status: AC
  Administered 2021-12-22: 500 mg via ORAL

## 2021-12-22 NOTE — Patient Instructions (Signed)

## 2021-12-22 NOTE — Progress Notes (Signed)
   12/22/21  CC: followup bladder cancer   HPI: Briana Ochoa is a 81yo here for followup for high grade bladder cancer diagnosed 04/2018 Blood pressure (!) 152/81, pulse 94. NED. A&Ox3.   No respiratory distress   Abd soft, NT, ND Normal external genitalia with patent urethral meatus  Cystoscopy Procedure Note  Patient identification was confirmed, informed consent was obtained, and patient was prepped using Betadine solution.  Lidocaine jelly was administered per urethral meatus.    Procedure: - Flexible cystoscope introduced, without any difficulty.   - Thorough search of the bladder revealed:    normal urethral meatus    normal urothelium    no stones    no ulcers     no tumors    no urethral polyps    no trabeculation  - Ureteral orifices were normal in position and appearance.  Post-Procedure: - Patient tolerated the procedure well  Assessment/ Plan: RTC 6 months for cystoscopy   No follow-ups on file.  Nicolette Bang, MD

## 2021-12-23 LAB — URINALYSIS, ROUTINE W REFLEX MICROSCOPIC
Bilirubin, UA: NEGATIVE
Glucose, UA: NEGATIVE
Ketones, UA: NEGATIVE
Leukocytes,UA: NEGATIVE
Nitrite, UA: NEGATIVE
RBC, UA: NEGATIVE
Specific Gravity, UA: 1.025 (ref 1.005–1.030)
Urobilinogen, Ur: 0.2 mg/dL (ref 0.2–1.0)
pH, UA: 5 (ref 5.0–7.5)

## 2022-03-14 ENCOUNTER — Other Ambulatory Visit: Payer: Self-pay

## 2022-03-14 DIAGNOSIS — E538 Deficiency of other specified B group vitamins: Secondary | ICD-10-CM

## 2022-03-14 DIAGNOSIS — C186 Malignant neoplasm of descending colon: Secondary | ICD-10-CM

## 2022-03-15 ENCOUNTER — Other Ambulatory Visit: Payer: Self-pay

## 2022-03-15 ENCOUNTER — Ambulatory Visit (HOSPITAL_COMMUNITY)
Admission: RE | Admit: 2022-03-15 | Discharge: 2022-03-15 | Disposition: A | Discharge status: Home / Self Care | Payer: Medicare PPO | Source: Ambulatory Visit | Attending: Hematology | Admitting: Hematology

## 2022-03-15 ENCOUNTER — Inpatient Hospital Stay: Payer: Medicare PPO | Attending: Hematology

## 2022-03-15 DIAGNOSIS — R059 Cough, unspecified: Secondary | ICD-10-CM | POA: Insufficient documentation

## 2022-03-15 DIAGNOSIS — Z800 Family history of malignant neoplasm of digestive organs: Secondary | ICD-10-CM | POA: Insufficient documentation

## 2022-03-15 DIAGNOSIS — R252 Cramp and spasm: Secondary | ICD-10-CM | POA: Insufficient documentation

## 2022-03-15 DIAGNOSIS — C186 Malignant neoplasm of descending colon: Secondary | ICD-10-CM | POA: Insufficient documentation

## 2022-03-15 DIAGNOSIS — K5730 Diverticulosis of large intestine without perforation or abscess without bleeding: Secondary | ICD-10-CM | POA: Diagnosis not present

## 2022-03-15 DIAGNOSIS — Z8049 Family history of malignant neoplasm of other genital organs: Secondary | ICD-10-CM | POA: Insufficient documentation

## 2022-03-15 DIAGNOSIS — I700 Atherosclerosis of aorta: Secondary | ICD-10-CM | POA: Diagnosis not present

## 2022-03-15 DIAGNOSIS — Z85038 Personal history of other malignant neoplasm of large intestine: Secondary | ICD-10-CM | POA: Insufficient documentation

## 2022-03-15 DIAGNOSIS — E538 Deficiency of other specified B group vitamins: Secondary | ICD-10-CM

## 2022-03-15 DIAGNOSIS — Z8551 Personal history of malignant neoplasm of bladder: Secondary | ICD-10-CM | POA: Insufficient documentation

## 2022-03-15 DIAGNOSIS — I129 Hypertensive chronic kidney disease with stage 1 through stage 4 chronic kidney disease, or unspecified chronic kidney disease: Secondary | ICD-10-CM | POA: Insufficient documentation

## 2022-03-15 DIAGNOSIS — R918 Other nonspecific abnormal finding of lung field: Secondary | ICD-10-CM | POA: Diagnosis not present

## 2022-03-15 DIAGNOSIS — Z853 Personal history of malignant neoplasm of breast: Secondary | ICD-10-CM | POA: Insufficient documentation

## 2022-03-15 DIAGNOSIS — Z803 Family history of malignant neoplasm of breast: Secondary | ICD-10-CM | POA: Insufficient documentation

## 2022-03-15 DIAGNOSIS — R911 Solitary pulmonary nodule: Secondary | ICD-10-CM | POA: Insufficient documentation

## 2022-03-15 DIAGNOSIS — N183 Chronic kidney disease, stage 3 unspecified: Secondary | ICD-10-CM | POA: Insufficient documentation

## 2022-03-15 DIAGNOSIS — Z8051 Family history of malignant neoplasm of kidney: Secondary | ICD-10-CM | POA: Insufficient documentation

## 2022-03-15 DIAGNOSIS — C189 Malignant neoplasm of colon, unspecified: Secondary | ICD-10-CM | POA: Diagnosis not present

## 2022-03-15 DIAGNOSIS — I2510 Atherosclerotic heart disease of native coronary artery without angina pectoris: Secondary | ICD-10-CM | POA: Diagnosis not present

## 2022-03-15 DIAGNOSIS — E1122 Type 2 diabetes mellitus with diabetic chronic kidney disease: Secondary | ICD-10-CM | POA: Insufficient documentation

## 2022-03-15 LAB — CBC WITH DIFFERENTIAL (CANCER CENTER ONLY)
Abs Immature Granulocytes: 0.03 10*3/uL (ref 0.00–0.07)
Basophils Absolute: 0.1 10*3/uL (ref 0.0–0.1)
Basophils Relative: 1 %
Eosinophils Absolute: 1.5 10*3/uL — ABNORMAL HIGH (ref 0.0–0.5)
Eosinophils Relative: 14 %
HCT: 46.2 % — ABNORMAL HIGH (ref 36.0–46.0)
Hemoglobin: 15.4 g/dL — ABNORMAL HIGH (ref 12.0–15.0)
Immature Granulocytes: 0 %
Lymphocytes Relative: 16 %
Lymphs Abs: 1.8 10*3/uL (ref 0.7–4.0)
MCH: 31.3 pg (ref 26.0–34.0)
MCHC: 33.3 g/dL (ref 30.0–36.0)
MCV: 93.9 fL (ref 80.0–100.0)
Monocytes Absolute: 1 10*3/uL (ref 0.1–1.0)
Monocytes Relative: 9 %
Neutro Abs: 6.6 10*3/uL (ref 1.7–7.7)
Neutrophils Relative %: 60 %
Platelet Count: 313 10*3/uL (ref 150–400)
RBC: 4.92 MIL/uL (ref 3.87–5.11)
RDW: 14.1 % (ref 11.5–15.5)
WBC Count: 11 10*3/uL — ABNORMAL HIGH (ref 4.0–10.5)
nRBC: 0 % (ref 0.0–0.2)

## 2022-03-15 LAB — CMP (CANCER CENTER ONLY)
ALT: 8 U/L (ref 0–44)
AST: 13 U/L — ABNORMAL LOW (ref 15–41)
Albumin: 3.9 g/dL (ref 3.5–5.0)
Alkaline Phosphatase: 84 U/L (ref 38–126)
Anion gap: 6 (ref 5–15)
BUN: 25 mg/dL — ABNORMAL HIGH (ref 8–23)
CO2: 28 mmol/L (ref 22–32)
Calcium: 9.8 mg/dL (ref 8.9–10.3)
Chloride: 105 mmol/L (ref 98–111)
Creatinine: 1.67 mg/dL — ABNORMAL HIGH (ref 0.44–1.00)
GFR, Estimated: 31 mL/min — ABNORMAL LOW (ref >60)
Glucose, Bld: 87 mg/dL (ref 70–99)
Potassium: 5.4 mmol/L — ABNORMAL HIGH (ref 3.5–5.1)
Sodium: 139 mmol/L (ref 135–145)
Total Bilirubin: 0.4 mg/dL (ref 0.3–1.2)
Total Protein: 6.9 g/dL (ref 6.5–8.1)

## 2022-03-15 LAB — VITAMIN B12: Vitamin B-12: 1210 pg/mL — ABNORMAL HIGH (ref 180–914)

## 2022-03-15 MED ORDER — IOHEXOL 300 MG/ML  SOLN
100.0000 mL | Freq: Once | INTRAMUSCULAR | Status: AC | PRN
  Administered 2022-03-15: 100 mL via INTRAVENOUS

## 2022-03-15 MED ORDER — IOHEXOL 9 MG/ML PO SOLN
500.0000 mL | ORAL | Status: AC
  Administered 2022-03-15: 1000 mL via ORAL

## 2022-03-15 MED ORDER — IOHEXOL 9 MG/ML PO SOLN
ORAL | Status: AC
  Filled 2022-03-15: qty 1000

## 2022-03-17 ENCOUNTER — Inpatient Hospital Stay: Payer: Medicare PPO | Admitting: Hematology

## 2022-03-17 ENCOUNTER — Other Ambulatory Visit: Payer: Self-pay

## 2022-03-17 VITALS — BP 137/66 | HR 80 | Temp 97.9°F | Ht 64.0 in | Wt 186.9 lb

## 2022-03-17 DIAGNOSIS — Z85038 Personal history of other malignant neoplasm of large intestine: Secondary | ICD-10-CM | POA: Diagnosis not present

## 2022-03-17 DIAGNOSIS — N183 Chronic kidney disease, stage 3 unspecified: Secondary | ICD-10-CM | POA: Diagnosis not present

## 2022-03-17 DIAGNOSIS — I129 Hypertensive chronic kidney disease with stage 1 through stage 4 chronic kidney disease, or unspecified chronic kidney disease: Secondary | ICD-10-CM | POA: Diagnosis not present

## 2022-03-17 DIAGNOSIS — R252 Cramp and spasm: Secondary | ICD-10-CM | POA: Diagnosis not present

## 2022-03-17 DIAGNOSIS — E1122 Type 2 diabetes mellitus with diabetic chronic kidney disease: Secondary | ICD-10-CM | POA: Diagnosis not present

## 2022-03-17 DIAGNOSIS — R059 Cough, unspecified: Secondary | ICD-10-CM | POA: Diagnosis not present

## 2022-03-17 DIAGNOSIS — C186 Malignant neoplasm of descending colon: Secondary | ICD-10-CM | POA: Diagnosis not present

## 2022-03-17 DIAGNOSIS — Z8049 Family history of malignant neoplasm of other genital organs: Secondary | ICD-10-CM | POA: Diagnosis not present

## 2022-03-17 DIAGNOSIS — Z853 Personal history of malignant neoplasm of breast: Secondary | ICD-10-CM | POA: Diagnosis not present

## 2022-03-17 DIAGNOSIS — Z8051 Family history of malignant neoplasm of kidney: Secondary | ICD-10-CM | POA: Diagnosis not present

## 2022-03-17 DIAGNOSIS — Z803 Family history of malignant neoplasm of breast: Secondary | ICD-10-CM | POA: Diagnosis not present

## 2022-03-17 DIAGNOSIS — Z800 Family history of malignant neoplasm of digestive organs: Secondary | ICD-10-CM | POA: Diagnosis not present

## 2022-03-17 DIAGNOSIS — R911 Solitary pulmonary nodule: Secondary | ICD-10-CM | POA: Diagnosis not present

## 2022-03-17 DIAGNOSIS — Z8551 Personal history of malignant neoplasm of bladder: Secondary | ICD-10-CM | POA: Diagnosis not present

## 2022-03-17 NOTE — Progress Notes (Signed)
Briana Ochoa   [phone] [phone]   Clinic Follow up Note   Patient Care Team: Nche, [name], NP as PCP - General (Internal Medicine) Nicholas Lose, MD as Consulting Physician (Hematology and Oncology) Garvin Fila, MD as Consulting Physician (Neurology) Consuella Lose, MD as Consulting Physician (Neurosurgery) Katy Apo, MD as Consulting Physician (Ophthalmology) Truitt Merle, MD as Consulting Physician (Hematology and Oncology) Leighton Ruff, MD as Consulting Physician (General Surgery)  Date of Service:  03/17/2022  CHIEF COMPLAINT: f/u of  colon cancer   CURRENT THERAPY:  Surveillance Oral B12 1000 mcg daily   ASSESSMENT:  Briana Ochoa is a 82 y.o. female with   Cancer of left colon ([place]) pT3N0M0, Stage II, MSI-High -diagnosed on 04/23/20 by Colonoscopy performed for heme-positive stool and anemia. -resection with Dr Marcello Moores on 05/28/20 showed 9.7cm mass in splenic flexure with invasive adenocarcinoma, poorly differentiated, negative margins, negative LNs.  -GuardantReveal on 07/07/20 was positive -staging CT CAP w/o contrast on 08/31/20 showed NED. -She began Xeloda and Keytruda on 09/21/20. Xeloda held due to increased Cr and then low EGFR from 12/04/20 - 02/09/21, and Beryle Flock was held due to taste change/mouth numbness from 12/25/20 - 02/09/21. Completed Xeloda in 04/2021 and Keytruda on 09/17/21. -I personally reviewed her restaging CT from March 15, 2022, which showed stable lung nodule, no evidence of cancer recurrence  L lung nodule  -she had biopsy in 03/2021 which was negative for malignant cells -stable size on subsequent CT scans -continue monitoring    PLAN: -Discuss CT scan, NED, stable left lung nodule  - Lab reviewed  -f/u in 3 months  SUMMARY OF ONCOLOGIC HISTORY: Oncology History Overview Note  Cancer Staging Cancer of left colon [facility]) Staging form: Colon and Rectum, AJCC 8th Edition - Pathologic  stage from 05/28/2020: Stage IIA (pT3, pN0, cM0) - Signed by Truitt Merle, MD on 06/19/2020 Stage prefix: Initial diagnosis Total positive nodes: 0 Histologic grading system: 4 grade system Histologic grade (G): G3 Residual tumor (R): R0 - None  Ductal carcinoma in situ (DCIS) of left breast Staging form: Breast, AJCC 7th Edition - Clinical: Stage 0 (Tis (DCIS), N0, cM0) - Unsigned Specimen type: Core Needle Biopsy Histopathologic type: 9932 Laterality: Left Staging comments: Staged at breast conference 7.22.15  - Pathologic: Stage 0 (Tis (DCIS)(2), N0, cM0) - Unsigned Specimen type: Core Needle Biopsy Histopathologic type: 9932 Laterality: Left Tumor size (mm): 3 Multiple tumors: Yes Number of tumors: 2 Method of lymph node assessment: Clinical Method of detection of distant metastases: Clinical Residual tumor (R): R0 - None Estrogen receptor status: Positive Progesterone receptor status: Positive    Ductal carcinoma in situ (DCIS) of left breast  08/20/2013 Initial Diagnosis   Left breast 12:00: DCIS with calcifications, yet 100%, PR 100%; 09/04/2013 second group of calcifications also biopsied proven to be DCIS ER/PR positive   09/19/2013 Surgery   Left breast lumpectomy DCIS 2 foci margins negative, 0.2 and 0.3 cm ER 100% PR 100%   10/08/2013 - 11/12/2013 Radiation Therapy   Adjuvant radiation therapy   11/12/2013 - 2020 Anti-estrogen oral therapy   Anastrozole 1 mg daily plan is for 5 years   01/09/2014 - 01/10/2014 Hospital Admission   Pipeline embolization 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 The report date is 07/30/2020.   The CancerNext-Expanded + RNAinsight gene panel offered by [employer] and  includes sequencing and rearrangement analysis for the following 77 genes: AIP, ALK, APC, ATM, AXIN2, BAP1, BARD1, BLM, BMPR1A, BRCA1, BRCA2,  BRIP1, CDC73, CDH1, CDK4, CDKN1B, CDKN2A, CHEK2, CTNNA1, DICER1, FANCC, FH, FLCN, GALNT12, KIF1B, LZTR1, MAX, MEN1, MET, MLH1, MSH2, MSH3, MSH6, MUTYH, NBN, NF1, NF2, NTHL1, PALB2, PHOX2B, PMS2, POT1, PRKAR1A, PTCH1, PTEN, RAD51C, RAD51D, RB1, RECQL, RET, SDHA, SDHAF2, SDHB, SDHC, SDHD, SMAD4, SMARCA4, SMARCB1, SMARCE1, STK11, SUFU, TMEM127, TP53, TSC1, TSC2, VHL and XRCC2 (sequencing and deletion/duplication); EGFR, EGLN1, HOXB13, KIT, MITF, PDGFRA, POLD1 and POLE (sequencing only); EPCAM and GREM1 (deletion/duplication only). RNA data is routinely analyzed for use in variant interpretation for all genes.   Bladder cancer ([place])  02/09/2018 Initial Diagnosis   Bladder cancer ([place])   07/30/2020 Genetic Testing   Negative genetic testing:  No pathogenic variants detected on the [facility] CancerNext-Expanded + RNAinsight panel. The report date is 07/30/2020.   The CancerNext-Expanded + RNAinsight gene panel offered by [employer] and includes sequencing and rearrangement analysis for the following 77 genes: AIP, ALK, APC, ATM, AXIN2, BAP1, BARD1, BLM, BMPR1A, BRCA1, BRCA2, BRIP1, CDC73, CDH1, CDK4, CDKN1B, CDKN2A, CHEK2, CTNNA1, DICER1, FANCC, FH, FLCN, GALNT12, KIF1B, LZTR1, MAX, MEN1, MET, MLH1, MSH2, MSH3, MSH6, MUTYH, NBN, NF1, NF2, NTHL1, PALB2, PHOX2B, PMS2, POT1, PRKAR1A, PTCH1, PTEN, RAD51C, RAD51D, RB1, RECQL, RET, SDHA, SDHAF2, SDHB, SDHC, SDHD, SMAD4, SMARCA4, SMARCB1, SMARCE1, STK11, SUFU, TMEM127, TP53, TSC1, TSC2, VHL and XRCC2 (sequencing and deletion/duplication); EGFR, EGLN1, HOXB13, KIT, MITF, PDGFRA, POLD1 and POLE (sequencing only); EPCAM and GREM1 (deletion/duplication only). RNA data is routinely analyzed for use in variant interpretation for all genes.   08/31/2020 Imaging   CT CAP w/o contrast  IMPRESSION: 1. No specific findings of active malignancy on today's noncontrast CT. There has been interval partial colectomy involving the tumor site with reanastomosis. Some faint stranding  in the adjacent omentum is probably from scarring or fat necrosis, less likely due to early tumor given the lack of overt nodularity. Surveillance imaging is likely indicated, and PET-CT may have a role in imaging follow up. 2. Several small pulmonary nodules, in addition to a larger mixed density nodule in the left upper lobe. Surveillance is recommended. 3. Other imaging findings of potential clinical significance: Aortic Atherosclerosis (ICD10-I70.0). Coronary atherosclerosis. Mitral valve calcification. Small type 1 hiatal hernia. Splenectomy with small amount of regenerative splenic tissue as well as a venous varix arising from the splenic vein. Hyperdense left kidney lower pole exophytic homogeneous lesion, probably a complex cyst. Sigmoid colon diverticulosis. Multilevel lumbar spondylosis and degenerative disc disease.   Cancer of left colon ([place])  04/23/2020 Procedure   Upper Endoscopy and Colonoscopy by Dr Carlean Purl IMPRESSION Erythematous mucosa in the antrum. Biopsied. - Small hiatal hernia. - Gastroesophageal flap valve classified as Hill Grade IV (no fold, wide open lumen, hiatal hernia present). - The examination was otherwise normal.   IMPRESSION - Decreased sphincter tone found on digital rectal exam. - Malignant partially obstructing tumor in the proximal descending colon. Biopsied. Tattooed. - Three 5 to 20 mm polyps in the sigmoid colon, removed piecemeal using a cold snare. Resected and retrieved. - Severe diverticulosis in the sigmoid colon. - I left several diminutive descending (proximal to tattoo), sigmoid and rectal polyps - The examination was otherwise normal on direct and retroflexion views.     04/23/2020 Initial Diagnosis   Diagnosis 1. Descending Colon Polyp, mass - ADENOCARCINOMA. 2. Sigmoid Colon Polyp, (3) - MULTIPLE FRAGMENTS OF TUBULAR ADENOMA. - MULTIPLE FRAGMENTS OF SESSILE SERRATED POLYP WITHOUT DYSPLASIA. -  NO HIGH GRADE DYSPLASIA OR  MALIGNANCY. Microscopic Comment 1. Dr. Vic Ripper has reviewed the case. Dr. Carlean Purl was notified on 04/24/2020. 2. Despite being left sided the fragments have features of a sessile serrated polyp, as opposed to, hyperplastic polyp.   04/27/2020 Imaging   CT AP IMPRESSION: Bulky annular soft tissue mass involving the distal transverse colon with extension into adjacent pericolonic fat, consistent with primary colon carcinoma.   No evidence of metastatic disease.   Colonic diverticulosis. No radiographic evidence of diverticulitis.   Aortic Atherosclerosis (ICD10-I70.0).     05/05/2020 Imaging   CT Chest IMPRESSION: Bulky annular soft tissue mass involving the distal transverse colon with extension into adjacent pericolonic fat, consistent with primary colon carcinoma.   No evidence of metastatic disease.   Colonic diverticulosis. No radiographic evidence of diverticulitis.   Aortic Atherosclerosis (ICD10-I70.0).     05/28/2020 Initial Diagnosis   Cancer of left colon ([place])   05/28/2020 Cancer Staging   Staging form: Colon and Rectum, AJCC 8th Edition - Pathologic stage from 05/28/2020: Stage IIA (pT3, pN0, cM0) - Signed by Truitt Merle, MD on 06/19/2020 Stage prefix: Initial diagnosis Total positive nodes: 0 Histologic grading system: 4 grade system Histologic grade (G): G3 Residual tumor (R): R0 - None   05/28/2020 Surgery   XI ROBOT ASSISTED RESECTION OF SPLENIC FLEXURE by Dr Marcello Moores    FINAL MICROSCOPIC DIAGNOSIS:   A. COLON, SPLENIC FLEXURE, RESECTION:  - Invasive adenocarcinoma, poorly differentiated, spanning 9.7 cm.  - Tumor invades through muscularis propria into pericolonic tissue.  - Resection margins are negative.  - Tubular adenoma (X1).  - Sessile serrate polyp without dysplasia (x2).  - Diverticulosis.  - Polypectomy scar.  - Twenty-four of twenty-four lymph nodes negative for carcinoma (0/24).  - See oncology table.    07/07/2020 Miscellaneous   Guardant  Reveal ctDNA positive MSI high   07/30/2020 Genetic Testing   Negative genetic testing:  No pathogenic variants detected on the [facility] CancerNext-Expanded + RNAinsight panel. The report date is 07/30/2020.   The CancerNext-Expanded + RNAinsight gene panel offered by [employer] and includes sequencing and rearrangement analysis for the following 77 genes: AIP, ALK, APC, ATM, AXIN2, BAP1, BARD1, BLM, BMPR1A, BRCA1, BRCA2, BRIP1, CDC73, CDH1, CDK4, CDKN1B, CDKN2A, CHEK2, CTNNA1, DICER1, FANCC, FH, FLCN, GALNT12, KIF1B, LZTR1, MAX, MEN1, MET, MLH1, MSH2, MSH3, MSH6, MUTYH, NBN, NF1, NF2, NTHL1, PALB2, PHOX2B, PMS2, POT1, PRKAR1A, PTCH1, PTEN, RAD51C, RAD51D, RB1, RECQL, RET, SDHA, SDHAF2, SDHB, SDHC, SDHD, SMAD4, SMARCA4, SMARCB1, SMARCE1, STK11, SUFU, TMEM127, TP53, TSC1, TSC2, VHL and XRCC2 (sequencing and deletion/duplication); EGFR, EGLN1, HOXB13, KIT, MITF, PDGFRA, POLD1 and POLE (sequencing only); EPCAM and GREM1 (deletion/duplication only). RNA data is routinely analyzed for use in variant interpretation for all genes.   08/31/2020 Imaging   CT CAP w/o contrast  IMPRESSION: 1. No specific findings of active malignancy on today's noncontrast CT. There has been interval partial colectomy involving the tumor site with reanastomosis. Some faint stranding in the adjacent omentum is probably from scarring or fat necrosis, less likely due to early tumor given the lack of overt nodularity. Surveillance imaging is likely indicated, and PET-CT may have a role in imaging follow up. 2. Several small pulmonary nodules, in addition to a larger mixed density nodule in the left upper lobe. Surveillance is recommended. 3. Other imaging findings of potential clinical significance: Aortic Atherosclerosis (ICD10-I70.0). Coronary atherosclerosis. Mitral valve calcification. Small type 1 hiatal hernia. Splenectomy with small amount of regenerative splenic tissue as well as a  venous varix arising from the  splenic vein. Hyperdense left kidney lower pole exophytic homogeneous lesion, probably a complex cyst. Sigmoid colon diverticulosis. Multilevel lumbar spondylosis and degenerative disc disease.   10/02/2020 - 09/17/2021 Chemotherapy   Patient is on Treatment Plan : COLORECTAL Pembrolizumab q21d     03/01/2021 Imaging   EXAM: CT CHEST, ABDOMEN AND PELVIS WITHOUT CONTRAST  IMPRESSION: 1. Mixed attenuation lesion posterior left upper lobe has areas of more confluent and enlarging nodularity today. Interval progression raises concern for neoplasm. Consider PET-CT to further evaluate. 2. Stable 7 mm right lower lobe pulmonary nodule with a slightly more conspicuous 3 mm nodule in the right lower lobe. Continued close attention on follow-up recommended. 3. No evidence for metastatic disease in the abdomen or pelvis. 4. Aortic Atherosclerosis (ICD10-I70.0).   03/17/2021 PET scan   IMPRESSION: 1. Part solid nodule of the left upper lobe demonstrates mild FDG uptake, and has increased in size when compared with prior exam dated May 05, 2020. Concerning for indolent primary lung neoplasm. 2. No evidence of metastatic disease in the chest, abdomen or pelvis. 3. 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 Recommend further evaluation with tissue sampling. 4. Aortic Atherosclerosis (ICD10-I70.0).      INTERVAL HISTORY:  Briana Ochoa is here for a follow up of   colon cancer She was last seen by me  on 12/16/2021 She presents to the clinic alone. Pt states she had Covid over the holidays. Pt states she has cough still.Pt denies have pain and BM issues. Pt state her taste has return. She reports of having leg cramps at night    All other systems were reviewed with the patient and are negative.  MEDICAL HISTORY:  Past Medical History:  Diagnosis Date   Allergy    seasonal allergies   Anemia    on meds   Aortic atherosclerosis  ([facility])    Bladder cancer ([place]) 2020   Bladder tumor    Bloody diarrhea 09/29/2017   Breast cancer ([place]) 2015   Left Breast Cancer   Cataract    bilateral -sx   CKD (chronic kidney disease), stage III ([place])    DM related   Colon cancer ([place])    Diabetes mellitus type 2, diet-controlled ([place])    diet controlled   Dyspnea 09/28/2017   Dysuria    Family history of adverse reaction to anesthesia    aunt- N/V    Family history of breast cancer    Family history of kidney cancer    Family history of throat cancer    Family history of uterine cancer    Fatigue 09/28/2017   Frequency of urination    Grade I diastolic dysfunction [id number]   Noted on ECHO   Gross hematuria 09/29/2017   Heart murmur    since rheumatic fever as child   Hematuria    History of cardiomegaly 02/12/2005   Mild, noted on CXR   History of CVA (cerebrovascular accident) 08/05/2014   post op cerebral angiogram, cerebral thrombosis w/ cerebral infarction;  11-02-2017  per pt no residuals   History of malignant melanoma of skin 12/2015   s/p wide local excision right lower leg (per pt localized)   History of rheumatic fever as a child    Hyperlipidemia    on meds   Hypertension    on meds   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   Sigmoid diverticulosis    Weakness 09/28/2017   Wears dentures    upper   Wears glasses    Wears hearing aid  in both ears     SURGICAL HISTORY: Past Surgical History:  Procedure Laterality Date   BREAST LUMPECTOMY Left 2015   BREAST LUMPECTOMY WITH NEEDLE LOCALIZATION Left 09/19/2013   Procedure: LEFT BREAST LUMPECTOMY WITH DOUBLE WIRE BRACKETED  NEEDLE LOCALIZATION;  Surgeon: [name], MD;  Location: [facility];  Service: General;  Laterality: Left;   BRONCHIAL BIOPSY  04/06/2021   Procedure: BRONCHIAL BIOPSIES;  Surgeon: Garner Nash, DO;  Location: [facility];  Service: Pulmonary;;   BRONCHIAL BRUSHINGS  04/06/2021   Procedure: BRONCHIAL BRUSHINGS;  Surgeon: Garner Nash, DO;  Location: [facility];  Service: Pulmonary;;   CATARACT EXTRACTION W/ INTRAOCULAR LENS  IMPLANT, BILATERAL  06/2015   CESAREAN SECTION  1964   CHOLECYSTECTOMY OPEN  1970s   CYSTOSCOPY W/ URETERAL STENT PLACEMENT Left 11/07/2017   Procedure: CYSTOSCOPY WITH RETROGRADE PYELOGRAM/URETERAL STENT PLACEMENT;  Surgeon: Cleon Gustin, MD;  Location: [facility];  Service: Urology;  Laterality: Left;   CYSTOSCOPY W/ URETERAL STENT PLACEMENT Bilateral 04/27/2018   Procedure: CYSTOSCOPY WITH RETROGRADE PYELOGRAM;  Surgeon: Cleon Gustin, MD;  Location: [facility];  Service: Urology;  Laterality: Bilateral;   FINGER SURGERY     lt thumb  from dog bite   IR  NEPHROURETERAL CATH PLACE LEFT  04/28/2018   MELANOMA EXCISION Right 12/28/2015   Procedure: EXCISION MELANOMA RIGHT LOWER LEG;  Surgeon: Fanny Skates, MD;  Location: [facility];  Service: General;  Laterality: Right;   RADIOLOGY WITH ANESTHESIA N/A 01/09/2014   Procedure: Embolization;  Surgeon: Consuella Lose, MD;  Location: [facility];  Service: Radiology;  Laterality: N/A;   SPLENECTOMY, TOTAL  2009 approx.   splen injury due to fall off horse   TONSILLECTOMY  child   TRANSURETHRAL RESECTION OF BLADDER TUMOR N/A 11/07/2017   Procedure: TRANSURETHRAL RESECTION OF BLADDER TUMOR (TURBT), POSSIBLE STENT  PLACEMENT;  Surgeon: Cleon Gustin, MD;  Location: [facility];  Service: Urology;  Laterality: N/A;   TRANSURETHRAL RESECTION OF BLADDER TUMOR N/A 12/14/2017   Procedure: TRANSURETHRAL RESECTION OF BLADDER TUMOR (TURBT);  Surgeon: Cleon Gustin, MD;  Location: [facility];  Service: Urology;  Laterality: N/A;  [place] TUMOR N/A 04/27/2018   Procedure: TRANSURETHRAL RESECTION OF BLADDER TUMOR (TURBT);  Surgeon: Cleon Gustin, MD;  Location[facility];  Service: Urology;  Laterality: N/A;  [place] Ochoa ULTRASOUND  04/06/2021   Procedure: RADIAL ENDOBRONCHIAL ULTRASOUND;  Surgeon: Garner Nash, DO;  Location: [facility];  Service: Pulmonary;;    I have reviewed the social history and family history with the patient and they are unchanged from previous note.  ALLERGIES:  is allergic to feraheme [ferumoxytol].  MEDICATIONS:  Current Outpatient Medications  Medication Sig Dispense Refill   acetaminophen (TYLENOL) 500 MG tablet Take 1,000 mg by mouth daily.     aspirin EC 81 MG tablet Take 81 mg by mouth daily. Swallow whole.     glucose blood (TRUE METRIX BLOOD GLUCOSE TEST) test strip Use as instructed to test blood sugar once daily E08.638 100 each 2   lisinopril (ZESTRIL) 20 MG tablet Take 1 tablet (20 mg total) by mouth daily. 90 tablet 3   magic mouthwash w/lidocaine SOLN Take 5 mLs by mouth every 6 (six) hours as needed for mouth pain. Swish and swallow 480 mL 1   Miconazole Nitrate-Wipes (MONISTAT 7 COMPLETE THERAPY) 100-2 MG-% KIT Place 1 kit vaginally once a week. (Patient not taking: Reported on 12/22/2021) 1 kit 1   pravastatin (PRAVACHOL) 10 MG tablet TAKE 1 TABLET EVERY DAY (STOP PRAVASTATIN 20MG) 90 tablet 3   No current facility-administered medications for this visit.    PHYSICAL EXAMINATION: ECOG PERFORMANCE STATUS: 0 -  Asymptomatic  Vitals:   03/17/22 1452  BP: 137/66  Pulse: 80  Temp: 97.9 F (36.6 C)  SpO2: 94%   Wt Readings from Last 3 Encounters:  03/17/22 186 lb 14.4 oz (84.8 kg)  09/17/21 179 lb 8 oz (81.4 kg)  08/27/21 181 lb 4.8 oz (82.2 kg)     GENERAL:alert, no distress and comfortable SKIN: skin color normal, no rashes or significant lesions EYES: normal, Conjunctiva are pink and non-injected, sclera clear  NEURO: alert & oriented x 3 with fluent speech   LABORATORY DATA:  I have reviewed the data as listed    Latest Ref Rng & Units 03/15/2022   11:14 AM 12/16/2021    1:44 PM 09/17/2021    8:59 AM  CBC  WBC 4.0 - 10.5 K/uL 11.0  9.7  8.8   Hemoglobin 12.0 - 15.0 g/dL 15.4  14.4  15.2   Hematocrit 36.0 - 46.0 % 46.2  43.5  44.6   Platelets 150 - 400 K/uL 313  333  278         Latest Ref Rng & Units 03/15/2022   11:14 AM 12/16/2021    1:44 PM 09/17/2021    8:59 AM  CMP  Glucose 70 - 99 mg/dL 87  92  140   BUN 8 - 23 mg/dL 25  34  30   Creatinine 0.44 - 1.00 mg/dL 1.67  1.58  1.41   Sodium 135 - 145 mmol/L 139  141  140   Potassium 3.5 - 5.1 mmol/L 5.4  4.7  5.0   Chloride 98 - 111 mmol/L 105  107  107   CO2 22 - 32 mmol/L 28  27  27   $ Calcium 8.9 - 10.3 mg/dL 9.8  9.2  9.3   Total Protein 6.5 - 8.1 g/dL 6.9  6.9  6.6   Total Bilirubin 0.3 - 1.2 mg/dL 0.4  0.3  0.4   Alkaline Phos 38 - 126 U/L 84  78  72   AST 15 - 41 U/L 13  13  13   $ ALT 0 - 44 U/L 8  9  9       $ RADIOGRAPHIC STUDIES: I have personally reviewed the radiological images as listed and agreed with the findings in the report. No results found.    No orders of the defined types were placed in this encounter.  All questions were answered. The patient knows to  call the clinic with any problems, questions or concerns. No barriers to learning was detected. The total time spent in the appointment was 30 minutes.     Truitt Merle, MD 03/17/2022   Felicity Coyer, CMA, am acting as scribe for Truitt Merle, MD.    I have reviewed the above documentation for accuracy and completeness, and I agree with the above.

## 2022-03-17 NOTE — Assessment & Plan Note (Signed)
pT3N0M0, Stage II, MSI-High -diagnosed on 04/23/20 by Colonoscopy performed for heme-positive stool and anemia. -resection with Dr Marcello Moores on 05/28/20 showed 9.7cm mass in splenic flexure with invasive adenocarcinoma, poorly differentiated, negative margins, negative LNs.  -GuardantReveal on 07/07/20 was positive -staging CT CAP w/o contrast on 08/31/20 showed NED. -She began Xeloda and Keytruda on 09/21/20. Xeloda held due to increased Cr and then low EGFR from 12/04/20 - 02/09/21, and Beryle Flock was held due to taste change/mouth numbness from 12/25/20 - 02/09/21. Completed Xeloda in 04/2021 and Keytruda on 09/17/21. -I personally reviewed her restaging CT from March 15, 2022, which showed stable lung nodule, no evidence of cancer recurrence

## 2022-03-19 ENCOUNTER — Encounter: Payer: Self-pay | Admitting: Hematology

## 2022-04-13 ENCOUNTER — Other Ambulatory Visit: Payer: Self-pay | Admitting: Nurse Practitioner

## 2022-04-13 DIAGNOSIS — I10 Essential (primary) hypertension: Secondary | ICD-10-CM

## 2022-04-16 ENCOUNTER — Other Ambulatory Visit: Payer: Self-pay | Admitting: Nurse Practitioner

## 2022-04-16 DIAGNOSIS — E782 Mixed hyperlipidemia: Secondary | ICD-10-CM

## 2022-04-18 ENCOUNTER — Other Ambulatory Visit: Payer: Self-pay

## 2022-04-18 DIAGNOSIS — E782 Mixed hyperlipidemia: Secondary | ICD-10-CM

## 2022-04-18 MED ORDER — PRAVASTATIN SODIUM 10 MG PO TABS: ORAL_TABLET | ORAL | 0 refills | Status: DC

## 2022-05-10 ENCOUNTER — Other Ambulatory Visit: Payer: Self-pay | Admitting: Nurse Practitioner

## 2022-05-10 DIAGNOSIS — E782 Mixed hyperlipidemia: Secondary | ICD-10-CM

## 2022-06-17 ENCOUNTER — Inpatient Hospital Stay: Payer: Medicare PPO | Admitting: Hematology

## 2022-06-17 ENCOUNTER — Inpatient Hospital Stay: Payer: Medicare PPO | Attending: Hematology

## 2022-06-17 ENCOUNTER — Other Ambulatory Visit: Payer: Self-pay

## 2022-06-17 ENCOUNTER — Encounter: Payer: Self-pay | Admitting: Hematology

## 2022-06-17 VITALS — BP 149/68 | HR 71 | Temp 98.1°F | Resp 18 | Ht 64.0 in | Wt 191.5 lb

## 2022-06-17 DIAGNOSIS — Z800 Family history of malignant neoplasm of digestive organs: Secondary | ICD-10-CM | POA: Insufficient documentation

## 2022-06-17 DIAGNOSIS — Z8051 Family history of malignant neoplasm of kidney: Secondary | ICD-10-CM | POA: Insufficient documentation

## 2022-06-17 DIAGNOSIS — R911 Solitary pulmonary nodule: Secondary | ICD-10-CM | POA: Insufficient documentation

## 2022-06-17 DIAGNOSIS — C186 Malignant neoplasm of descending colon: Secondary | ICD-10-CM

## 2022-06-17 DIAGNOSIS — Z8673 Personal history of transient ischemic attack (TIA), and cerebral infarction without residual deficits: Secondary | ICD-10-CM | POA: Diagnosis not present

## 2022-06-17 DIAGNOSIS — Z86000 Personal history of in-situ neoplasm of breast: Secondary | ICD-10-CM | POA: Diagnosis not present

## 2022-06-17 DIAGNOSIS — Z8551 Personal history of malignant neoplasm of bladder: Secondary | ICD-10-CM | POA: Diagnosis not present

## 2022-06-17 DIAGNOSIS — Z803 Family history of malignant neoplasm of breast: Secondary | ICD-10-CM | POA: Insufficient documentation

## 2022-06-17 DIAGNOSIS — Z85820 Personal history of malignant melanoma of skin: Secondary | ICD-10-CM | POA: Insufficient documentation

## 2022-06-17 DIAGNOSIS — Z8049 Family history of malignant neoplasm of other genital organs: Secondary | ICD-10-CM | POA: Diagnosis not present

## 2022-06-17 DIAGNOSIS — Z85038 Personal history of other malignant neoplasm of large intestine: Secondary | ICD-10-CM | POA: Insufficient documentation

## 2022-06-17 DIAGNOSIS — E538 Deficiency of other specified B group vitamins: Secondary | ICD-10-CM

## 2022-06-17 LAB — CMP (CANCER CENTER ONLY)
ALT: 8 U/L (ref 0–44)
AST: 13 U/L — ABNORMAL LOW (ref 15–41)
Albumin: 3.8 g/dL (ref 3.5–5.0)
Alkaline Phosphatase: 87 U/L (ref 38–126)
Anion gap: 6 (ref 5–15)
BUN: 32 mg/dL — ABNORMAL HIGH (ref 8–23)
CO2: 25 mmol/L (ref 22–32)
Calcium: 9.2 mg/dL (ref 8.9–10.3)
Chloride: 107 mmol/L (ref 98–111)
Creatinine: 1.54 mg/dL — ABNORMAL HIGH (ref 0.44–1.00)
GFR, Estimated: 34 mL/min — ABNORMAL LOW (ref >60)
Glucose, Bld: 101 mg/dL — ABNORMAL HIGH (ref 70–99)
Potassium: 5.1 mmol/L (ref 3.5–5.1)
Sodium: 138 mmol/L (ref 135–145)
Total Bilirubin: 0.2 mg/dL — ABNORMAL LOW (ref 0.3–1.2)
Total Protein: 6.9 g/dL (ref 6.5–8.1)

## 2022-06-17 LAB — CBC WITH DIFFERENTIAL (CANCER CENTER ONLY)
Abs Immature Granulocytes: 0.05 10*3/uL (ref 0.00–0.07)
Basophils Absolute: 0.2 10*3/uL — ABNORMAL HIGH (ref 0.0–0.1)
Basophils Relative: 1 %
Eosinophils Absolute: 1.1 10*3/uL — ABNORMAL HIGH (ref 0.0–0.5)
Eosinophils Relative: 10 %
HCT: 43.3 % (ref 36.0–46.0)
Hemoglobin: 14.9 g/dL (ref 12.0–15.0)
Immature Granulocytes: 1 %
Lymphocytes Relative: 20 %
Lymphs Abs: 2.2 10*3/uL (ref 0.7–4.0)
MCH: 31.8 pg (ref 26.0–34.0)
MCHC: 34.4 g/dL (ref 30.0–36.0)
MCV: 92.3 fL (ref 80.0–100.0)
Monocytes Absolute: 1.2 10*3/uL — ABNORMAL HIGH (ref 0.1–1.0)
Monocytes Relative: 11 %
Neutro Abs: 6.4 10*3/uL (ref 1.7–7.7)
Neutrophils Relative %: 57 %
Platelet Count: 299 10*3/uL (ref 150–400)
RBC: 4.69 MIL/uL (ref 3.87–5.11)
RDW: 13.5 % (ref 11.5–15.5)
WBC Count: 11.1 10*3/uL — ABNORMAL HIGH (ref 4.0–10.5)
nRBC: 0 % (ref 0.0–0.2)

## 2022-06-17 LAB — VITAMIN B12: Vitamin B-12: 1238 pg/mL — ABNORMAL HIGH (ref 180–914)

## 2022-06-17 NOTE — Progress Notes (Signed)
[facility]   Telephone:([phone] Fax:([phone]) [phone]   Clinic Follow up Note   Patient Care Team: Nche, Bonna Gains, NP as PCP - General (Internal Medicine) Serena Croissant, MD as Consulting Physician (Hematology and Oncology) Micki Riley, MD as Consulting Physician (Neurology) Lisbeth Renshaw, MD as Consulting Physician (Neurosurgery) Antony Contras, MD as Consulting Physician (Ophthalmology) Malachy Mood, MD as Consulting Physician (Hematology and Oncology) Romie Levee, MD as Consulting Physician (General Surgery)  Date of Service:  06/17/2022  CHIEF COMPLAINT: f/u of  colon cancer    CURRENT THERAPY:  Surveillance Oral B12 1000 mcg daily   ASSESSMENT:  Briana Ochoa is a 82 y.o. female with   Cancer of left colon (HCC) pT3N0M0, Stage II, MSI-High -diagnosed on 04/23/20 by Colonoscopy performed for heme-positive stool and anemia. -resection with Dr Maisie Fus on 05/28/20 showed 9.7cm mass in splenic flexure with invasive adenocarcinoma, poorly differentiated, negative margins, negative LNs.  -GuardantReveal on 07/07/20 was positive -staging CT CAP w/o contrast on 08/31/20 showed NED. -She began Xeloda and Keytruda on 09/21/20. Xeloda held due to increased Cr and then low EGFR from 12/04/20 - 02/09/21, and Rande Lawman was held due to taste change/mouth numbness from 12/25/20 - 02/09/21. Completed Xeloda in 04/2021 and Keytruda on 09/17/21. -I personally reviewed her restaging CT from March 15, 2022, which showed stable lung nodule, no evidence of cancer recurrence -She is clinically doing well, her taste loss has resolved, no other residual side effect from previous treatment.  Lab reviewed, exam unremarkable, there is no clinical concern for recurrence. -Plan to repeat CT scan in 3 months, then once a year.   L lung nodule  -she had biopsy in 03/2021 which was negative for malignant cells -stable size on subsequent CT scans -continue monitoring   PLAN: - CT  scan in 3 months - follow up in 3 months with labs    SUMMARY OF ONCOLOGIC HISTORY: Oncology History Overview Note  Cancer Staging Cancer of left colon [facility]) Staging form: Colon and Rectum, AJCC 8th Edition - Pathologic stage from 05/28/2020: Stage IIA (pT3, pN0, cM0) - Signed by Malachy Mood, MD on 06/19/2020 Stage prefix: Initial diagnosis Total positive nodes: 0 Histologic grading system: 4 grade system Histologic grade (G): G3 Residual tumor (R): R0 - None  Ductal carcinoma in situ (DCIS) of left breast Staging form: Breast, AJCC 7th Edition - Clinical: Stage 0 (Tis (DCIS), N0, cM0) - Unsigned Specimen type: Core Needle Biopsy Histopathologic type: 9932 Laterality: Left Staging comments: Staged at breast conference 7.22.15  - Pathologic: Stage 0 (Tis (DCIS)(2), N0, cM0) - Unsigned Specimen type: Core Needle Biopsy Histopathologic type: 9932 Laterality: Left Tumor size (mm): 3 Multiple tumors: Yes Number of tumors: 2 Method of lymph node assessment: Clinical Method of detection of distant metastases: Clinical Residual tumor (R): R0 - None Estrogen receptor status: Positive Progesterone receptor status: Positive    Ductal carcinoma in situ (DCIS) of left breast  08/20/2013 Initial Diagnosis   Left breast 12:00: DCIS with calcifications, yet 100%, PR 100%; 09/04/2013 second group of calcifications also biopsied proven to be DCIS ER/PR positive   09/19/2013 Surgery   Left breast lumpectomy DCIS 2 foci margins negative, 0.2 and 0.3 cm ER 100% PR 100%   10/08/2013 - 11/12/2013 Radiation Therapy   Adjuvant radiation therapy   11/12/2013 - 2020 Anti-estrogen oral therapy   Anastrozole 1 mg daily plan is for 5 years   01/09/2014 - 01/10/2014 Hospital Admission   Pipeline embolization RICA aneurysm in  the brain   07/30/2020 Genetic Testing   Negative genetic testing:  No pathogenic variants detected on the [employer] CancerNext-Expanded + RNAinsight panel. The report date is 07/30/2020.    The CancerNext-Expanded + RNAinsight gene panel offered by [employer] and includes sequencing and rearrangement analysis for the following 77 genes: AIP, ALK, APC, ATM, AXIN2, BAP1, BARD1, BLM, BMPR1A, BRCA1, BRCA2, BRIP1, CDC73, CDH1, CDK4, CDKN1B, CDKN2A, CHEK2, CTNNA1, DICER1, FANCC, FH, FLCN, GALNT12, KIF1B, LZTR1, MAX, MEN1, MET, MLH1, MSH2, MSH3, MSH6, MUTYH, NBN, NF1, NF2, NTHL1, PALB2, PHOX2B, PMS2, POT1, PRKAR1A, PTCH1, PTEN, RAD51C, RAD51D, RB1, RECQL, RET, SDHA, SDHAF2, SDHB, SDHC, SDHD, SMAD4, SMARCA4, SMARCB1, SMARCE1, STK11, SUFU, TMEM127, TP53, TSC1, TSC2, VHL and XRCC2 (sequencing and deletion/duplication); EGFR, EGLN1, HOXB13, KIT, MITF, PDGFRA, POLD1 and POLE (sequencing only); EPCAM and GREM1 (deletion/duplication only). RNA data is routinely analyzed for use in variant interpretation for all genes.   Bladder cancer (HCC)  02/09/2018 Initial Diagnosis   Bladder cancer (HCC)   07/30/2020 Genetic Testing   Negative genetic testing:  No pathogenic variants detected on the [employer] CancerNext-Expanded + RNAinsight panel. The report date is 07/30/2020.   The CancerNext-Expanded + RNAinsight gene panel offered by [employer] and includes sequencing and rearrangement analysis for the following 77 genes: AIP, ALK, APC, ATM, AXIN2, BAP1, BARD1, BLM, BMPR1A, BRCA1, BRCA2, BRIP1, CDC73, CDH1, CDK4, CDKN1B, CDKN2A, CHEK2, CTNNA1, DICER1, FANCC, FH, FLCN, GALNT12, KIF1B, LZTR1, MAX, MEN1, MET, MLH1, MSH2, MSH3, MSH6, MUTYH, NBN, NF1, NF2, NTHL1, PALB2, PHOX2B, PMS2, POT1, PRKAR1A, PTCH1, PTEN, RAD51C, RAD51D, RB1, RECQL, RET, SDHA, SDHAF2, SDHB, SDHC, SDHD, SMAD4, SMARCA4, SMARCB1, SMARCE1, STK11, SUFU, TMEM127, TP53, TSC1, TSC2, VHL and XRCC2 (sequencing and deletion/duplication); EGFR, EGLN1, HOXB13, KIT, MITF, PDGFRA, POLD1 and POLE (sequencing only); EPCAM and GREM1 (deletion/duplication only). RNA data is routinely analyzed for use in variant interpretation for all genes.   08/31/2020  Imaging   CT CAP w/o contrast  IMPRESSION: 1. No specific findings of active malignancy on today's noncontrast CT. There has been interval partial colectomy involving the tumor site with reanastomosis. Some faint stranding in the adjacent omentum is probably from scarring or fat necrosis, less likely due to early tumor given the lack of overt nodularity. Surveillance imaging is likely indicated, and PET-CT may have a role in imaging follow up. 2. Several small pulmonary nodules, in addition to a larger mixed density nodule in the left upper lobe. Surveillance is recommended. 3. Other imaging findings of potential clinical significance: Aortic Atherosclerosis (ICD10-I70.0). Coronary atherosclerosis. Mitral valve calcification. Small type 1 hiatal hernia. Splenectomy with small amount of regenerative splenic tissue as well as a venous varix arising from the splenic vein. Hyperdense left kidney lower pole exophytic homogeneous lesion, probably a complex cyst. Sigmoid colon diverticulosis. Multilevel lumbar spondylosis and degenerative disc disease.   Cancer of left colon (HCC)  04/23/2020 Procedure   Upper Endoscopy and Colonoscopy by Dr Leone Payor IMPRESSION Erythematous mucosa in the antrum. Biopsied. - Small hiatal hernia. - Gastroesophageal flap valve classified as Hill Grade IV (no fold, wide open lumen, hiatal hernia present). - The examination was otherwise normal.   IMPRESSION - Decreased sphincter tone found on digital rectal exam. - Malignant partially obstructing tumor in the proximal descending colon. Biopsied. Tattooed. - Three 5 to 20 mm polyps in the sigmoid colon, removed piecemeal using a cold snare. Resected and retrieved. - Severe diverticulosis in the sigmoid colon. - I left several diminutive descending (proximal to tattoo), sigmoid and rectal polyps - The examination was otherwise normal  on direct and retroflexion views.     04/23/2020 Initial Diagnosis    Diagnosis 1. Descending Colon Polyp, mass - ADENOCARCINOMA. 2. Sigmoid Colon Polyp, (3) - MULTIPLE FRAGMENTS OF TUBULAR ADENOMA. - MULTIPLE FRAGMENTS OF SESSILE SERRATED POLYP WITHOUT DYSPLASIA. - NO HIGH GRADE DYSPLASIA OR MALIGNANCY. Microscopic Comment 1. Dr. Kenard Gower has reviewed the case. Dr. Leone Payor was notified on 04/24/2020. 2. Despite being left sided the fragments have features of a sessile serrated polyp, as opposed to, hyperplastic polyp.   04/27/2020 Imaging   CT AP IMPRESSION: Bulky annular soft tissue mass involving the distal transverse colon with extension into adjacent pericolonic fat, consistent with primary colon carcinoma.   No evidence of metastatic disease.   Colonic diverticulosis. No radiographic evidence of diverticulitis.   Aortic Atherosclerosis (ICD10-I70.0).     05/05/2020 Imaging   CT Chest IMPRESSION: Bulky annular soft tissue mass involving the distal transverse colon with extension into adjacent pericolonic fat, consistent with primary colon carcinoma.   No evidence of metastatic disease.   Colonic diverticulosis. No radiographic evidence of diverticulitis.   Aortic Atherosclerosis (ICD10-I70.0).     05/28/2020 Initial Diagnosis   Cancer of left colon (HCC)   05/28/2020 Cancer Staging   Staging form: Colon and Rectum, AJCC 8th Edition - Pathologic stage from 05/28/2020: Stage IIA (pT3, pN0, cM0) - Signed by Malachy Mood, MD on 06/19/2020 Stage prefix: Initial diagnosis Total positive nodes: 0 Histologic grading system: 4 grade system Histologic grade (G): G3 Residual tumor (R): R0 - None   05/28/2020 Surgery   XI ROBOT ASSISTED RESECTION OF SPLENIC FLEXURE by Dr Maisie Fus    FINAL MICROSCOPIC DIAGNOSIS:   A. COLON, SPLENIC FLEXURE, RESECTION:  - Invasive adenocarcinoma, poorly differentiated, spanning 9.7 cm.  - Tumor invades through muscularis propria into pericolonic tissue.  - Resection margins are negative.  - Tubular adenoma (X1).   - Sessile serrate polyp without dysplasia (x2).  - Diverticulosis.  - Polypectomy scar.  - Twenty-four of twenty-four lymph nodes negative for carcinoma (0/24).  - See oncology table.    07/07/2020 Miscellaneous   Guardant Reveal ctDNA positive MSI high   07/30/2020 Genetic Testing   Negative genetic testing:  No pathogenic variants detected on the [employer] CancerNext-Expanded + RNAinsight panel. The report date is 07/30/2020.   The CancerNext-Expanded + RNAinsight gene panel offered by [employer] and includes sequencing and rearrangement analysis for the following 77 genes: AIP, ALK, APC, ATM, AXIN2, BAP1, BARD1, BLM, BMPR1A, BRCA1, BRCA2, BRIP1, CDC73, CDH1, CDK4, CDKN1B, CDKN2A, CHEK2, CTNNA1, DICER1, FANCC, FH, FLCN, GALNT12, KIF1B, LZTR1, MAX, MEN1, MET, MLH1, MSH2, MSH3, MSH6, MUTYH, NBN, NF1, NF2, NTHL1, PALB2, PHOX2B, PMS2, POT1, PRKAR1A, PTCH1, PTEN, RAD51C, RAD51D, RB1, RECQL, RET, SDHA, SDHAF2, SDHB, SDHC, SDHD, SMAD4, SMARCA4, SMARCB1, SMARCE1, STK11, SUFU, TMEM127, TP53, TSC1, TSC2, VHL and XRCC2 (sequencing and deletion/duplication); EGFR, EGLN1, HOXB13, KIT, MITF, PDGFRA, POLD1 and POLE (sequencing only); EPCAM and GREM1 (deletion/duplication only). RNA data is routinely analyzed for use in variant interpretation for all genes.   08/31/2020 Imaging   CT CAP w/o contrast  IMPRESSION: 1. No specific findings of active malignancy on today's noncontrast CT. There has been interval partial colectomy involving the tumor site with reanastomosis. Some faint stranding in the adjacent omentum is probably from scarring or fat necrosis, less likely due to early tumor given the lack of overt nodularity. Surveillance imaging is likely indicated, and PET-CT may have a role in imaging follow up. 2. Several small pulmonary nodules, in addition to a larger mixed  density nodule in the left upper lobe. Surveillance is recommended. 3. Other imaging findings of potential clinical significance:  Aortic Atherosclerosis (ICD10-I70.0). Coronary atherosclerosis. Mitral valve calcification. Small type 1 hiatal hernia. Splenectomy with small amount of regenerative splenic tissue as well as a venous varix arising from the splenic vein. Hyperdense left kidney lower pole exophytic homogeneous lesion, probably a complex cyst. Sigmoid colon diverticulosis. Multilevel lumbar spondylosis and degenerative disc disease.   10/02/2020 - 09/17/2021 Chemotherapy   Patient is on Treatment Plan : COLORECTAL Pembrolizumab q21d     03/01/2021 Imaging   EXAM: CT CHEST, ABDOMEN AND PELVIS WITHOUT CONTRAST  IMPRESSION: 1. Mixed attenuation lesion posterior left upper lobe has areas of more confluent and enlarging nodularity today. Interval progression raises concern for neoplasm. Consider PET-CT to further evaluate. 2. Stable 7 mm right lower lobe pulmonary nodule with a slightly more conspicuous 3 mm nodule in the right lower lobe. Continued close attention on follow-up recommended. 3. No evidence for metastatic disease in the abdomen or pelvis. 4. Aortic Atherosclerosis (ICD10-I70.0).   03/17/2021 PET scan   IMPRESSION: 1. Part solid nodule of the left upper lobe demonstrates mild FDG uptake, and has increased in size when compared with prior exam dated May 05, 2020. Concerning for indolent primary lung neoplasm. 2. No evidence of metastatic disease in the chest, abdomen or pelvis. 3. 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 Recommend further evaluation with tissue sampling. 4. Aortic Atherosclerosis (ICD10-I70.0).   03/15/2022 Imaging    IMPRESSION: Stable irregular solid pulmonary nodule in posterior left upper lobe.   No evidence of recurrent or metastatic disease within the abdomen or pelvis.   Colonic diverticulosis, without radiographic evidence of diverticulitis.      INTERVAL HISTORY:  Briana Ochoa is here for a  follow up of colon cancer. She was last seen by me on 03/17/2022. She presents to the clinic alone. Patient reports doing great. States she has gained all her weight back that the cancer caused her to loose.    All other systems were reviewed with the patient and are negative.  MEDICAL HISTORY:  Past Medical History:  Diagnosis Date   Allergy    seasonal allergies   Anemia    on meds   Aortic atherosclerosis (HCC)    Bladder cancer (HCC) 2020   Bladder tumor    Bloody diarrhea 09/29/2017   Breast cancer (HCC) 2015   Left Breast Cancer   Cataract    bilateral -sx   CKD (chronic kidney disease), stage III (HCC)    DM related   Colon cancer (HCC)    Diabetes mellitus type 2, diet-controlled (HCC)    diet controlled   Dyspnea 09/28/2017   Dysuria    Family history of adverse reaction to anesthesia    aunt- N/V    Family history of breast cancer    Family history of kidney cancer    Family history of throat cancer    Family history of uterine cancer    Fatigue 09/28/2017   Frequency of urination    Grade I diastolic dysfunction 08/06/2014   Noted on ECHO   Gross hematuria 09/29/2017   Heart murmur    since rheumatic fever as child   Hematuria    History of cardiomegaly 02/12/2005   Mild, noted on CXR   History of CVA (cerebrovascular accident) 08/05/2014   post op cerebral angiogram, cerebral thrombosis w/ cerebral infarction;  11-02-2017  per pt no residuals  History of malignant melanoma of skin 12/2015   s/p wide local excision right lower leg (per pt localized)   History of rheumatic fever as a child    Hyperlipidemia    on meds   Hypertension    on meds   Intracranial aneurysm dx 07/ 2015   paraophthalmic RICA aneurysm /   s/p  Pipeline embolization right ICA 01-09-2014   Jaundice 09/28/2017   Malignant neoplasm of central portion of left breast in female, estrogen receptor positive [facility]) oncologist-  dr Pamelia Hoit   dx 07/ 2015--- DCIS, ER/PR positive-- s/p  left  breast lumpectomy 09-19-2013,  completed radiation 11-12-2013,  started arimidex 11-12-2013   Melanoma (HCC)    righ tleg    OA (osteoarthritis)    knees, hands, R shoulder   Osteopenia 10/16/2012   Diffuse   Personal history of radiation therapy    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   Sigmoid diverticulosis    Weakness 09/28/2017   Wears dentures    upper   Wears glasses    Wears hearing aid in both ears     SURGICAL HISTORY: Past Surgical History:  Procedure Laterality Date   BREAST LUMPECTOMY Left 2015   BREAST LUMPECTOMY WITH NEEDLE LOCALIZATION Left 09/19/2013   Procedure: LEFT BREAST LUMPECTOMY WITH DOUBLE WIRE BRACKETED  NEEDLE LOCALIZATION;  Surgeon: Ernestene Mention, MD;  Location: [facility] OR;  Service: General;  Laterality: Left;   BRONCHIAL BIOPSY  04/06/2021   Procedure: BRONCHIAL BIOPSIES;  Surgeon: Josephine Igo, DO;  Location: [facility];  Service: Pulmonary;;   BRONCHIAL BRUSHINGS  04/06/2021   Procedure: BRONCHIAL BRUSHINGS;  Surgeon: Josephine Igo, DO;  Location: [facility];  Service: Pulmonary;;   CATARACT EXTRACTION W/ INTRAOCULAR LENS  IMPLANT, BILATERAL  06/2015   CESAREAN SECTION  1964   CHOLECYSTECTOMY OPEN  1970s   CYSTOSCOPY W/ URETERAL STENT PLACEMENT Left 11/07/2017   Procedure: CYSTOSCOPY WITH RETROGRADE PYELOGRAM/URETERAL STENT PLACEMENT;  Surgeon: Malen Gauze, MD;  Location: [facility];  Service: Urology;  Laterality: Left;   CYSTOSCOPY W/ URETERAL STENT PLACEMENT Bilateral 04/27/2018   Procedure: CYSTOSCOPY WITH RETROGRADE PYELOGRAM;  Surgeon: Malen Gauze, MD;  Location: [facility];  Service: Urology;  Laterality: Bilateral;   FINGER SURGERY     lt thumb  from dog bite   IR  NEPHROURETERAL CATH PLACE LEFT  04/28/2018   MELANOMA EXCISION Right 12/28/2015   Procedure: EXCISION MELANOMA RIGHT LOWER LEG;  Surgeon: Claud Kelp, MD;  Location: [facility];  Service: General;   Laterality: Right;   RADIOLOGY WITH ANESTHESIA N/A 01/09/2014   Procedure: Embolization;  Surgeon: Lisbeth Renshaw, MD;  Location: [facility] OR;  Service: Radiology;  Laterality: N/A;   SPLENECTOMY, TOTAL  2009 approx.   splen injury due to fall off horse   TONSILLECTOMY  child   TRANSURETHRAL RESECTION OF BLADDER TUMOR N/A 11/07/2017   Procedure: TRANSURETHRAL RESECTION OF BLADDER TUMOR (TURBT), POSSIBLE STENT PLACEMENT;  Surgeon: Malen Gauze, MD;  Location: [facility];  Service: Urology;  Laterality: N/A;   TRANSURETHRAL RESECTION OF BLADDER TUMOR N/A 12/14/2017   Procedure: TRANSURETHRAL RESECTION OF BLADDER TUMOR (TURBT);  Surgeon: Malen Gauze, MD;  Location: [facility];  Service: Urology;  Laterality: N/A;  30 MINS   TRANSURETHRAL RESECTION OF BLADDER TUMOR N/A 04/27/2018   Procedure: TRANSURETHRAL RESECTION OF BLADDER TUMOR (TURBT);  Surgeon: Malen Gauze, MD;  Location: [facility];  Service: Urology;  Laterality: N/A;  30 MINS   VIDEO BRONCHOSCOPY WITH RADIAL ENDOBRONCHIAL ULTRASOUND  04/06/2021   Procedure: RADIAL ENDOBRONCHIAL ULTRASOUND;  Surgeon: Josephine Igo, DO;  Location: [facility];  Service: Pulmonary;;    I have reviewed the social history and family history with the patient and they are unchanged from previous note.  ALLERGIES:  is allergic to feraheme [ferumoxytol].  MEDICATIONS:  Current Outpatient Medications  Medication Sig Dispense Refill   acetaminophen (TYLENOL) 500 MG tablet Take 1,000 mg by mouth daily.     aspirin EC 81 MG tablet Take 81 mg by mouth daily. Swallow whole.     glucose blood (TRUE METRIX BLOOD GLUCOSE TEST) test strip Use as instructed to test blood sugar once daily E08.638 100 each 2   lisinopril (ZESTRIL) 20 MG tablet TAKE 1 TABLET EVERY DAY 90 tablet 3   Miconazole Nitrate-Wipes (MONISTAT 7 COMPLETE THERAPY) 100-2 MG-% KIT Place 1 kit vaginally once a week. (Patient not taking:  Reported on 12/22/2021) 1 kit 1   pravastatin (PRAVACHOL) 10 MG tablet TAKE 1 TABLET EVERY DAY (STOP PRAVASTATIN 20MG ) 30 tablet 11   No current facility-administered medications for this visit.    PHYSICAL EXAMINATION: ECOG PERFORMANCE STATUS: 0 - Asymptomatic  Vitals:   06/17/22 1438  BP: (!) 149/68  Pulse: 71  Resp: 18  Temp: 98.1 F (36.7 C)  SpO2: 97%   Wt Readings from Last 3 Encounters:  06/17/22 191 lb 8 oz (86.9 kg)  03/17/22 186 lb 14.4 oz (84.8 kg)  09/17/21 179 lb 8 oz (81.4 kg)     GENERAL:alert, no distress and comfortable SKIN: skin color, texture, turgor are normal, no rashes or significant lesions EYES: normal, Conjunctiva are pink and non-injected, sclera clear NECK: supple, thyroid normal size, non-tender, without nodularity LYMPH:  no palpable lymphadenopathy in the cervical, axillary  LUNGS: clear to auscultation and percussion with normal breathing effort HEART: regular rate & rhythm and no murmurs and no lower extremity edema ABDOMEN:abdomen soft, non-tender and normal bowel sounds Musculoskeletal:no cyanosis of digits and no clubbing  NEURO: alert & oriented x 3 with fluent speech, no focal motor/sensory deficits  LABORATORY DATA:  I have reviewed the data as listed    Latest Ref Rng & Units 06/17/2022    2:09 PM 03/15/2022   11:14 AM 12/16/2021    1:44 PM  CBC  WBC 4.0 - 10.5 K/uL 11.1  11.0  9.7   Hemoglobin 12.0 - 15.0 g/dL 21.3  08.6  57.8   Hematocrit 36.0 - 46.0 % 43.3  46.2  43.5   Platelets 150 - 400 K/uL 299  313  333         Latest Ref Rng & Units 06/17/2022    2:09 PM 03/15/2022   11:14 AM 12/16/2021    1:44 PM  CMP  Glucose 70 - 99 mg/dL 469  87  92   BUN 8 - 23 mg/dL 32  25  34   Creatinine 0.44 - 1.00 mg/dL 6.29  5.28  4.13   Sodium 135 - 145 mmol/L 138  139  141   Potassium 3.5 - 5.1 mmol/L 5.1  5.4  4.7   Chloride 98 - 111 mmol/L 107  105  107   CO2 22 - 32 mmol/L 25  28  27    Calcium 8.9 - 10.3 mg/dL 9.2  9.8  9.2    Total Protein 6.5 - 8.1 g/dL 6.9  6.9  6.9  Total Bilirubin 0.3 - 1.2 mg/dL 0.2  0.4  0.3   Alkaline Phos 38 - 126 U/L 87  84  78   AST 15 - 41 U/L 13  13  13    ALT 0 - 44 U/L 8  8  9        RADIOGRAPHIC STUDIES: I have personally reviewed the radiological images as listed and agreed with the findings in the report. No results found.    No orders of the defined types were placed in this encounter.  All questions were answered. The patient knows to call the clinic with any problems, questions or concerns. No barriers to learning was detected. The total time spent in the appointment was .     Malachy Mood, MD 06/17/2022   I, Sharlette Dense, CMA, am acting as scribe for Malachy Mood, MD.   I have reviewed the above documentation for accuracy and completeness, and I agree with the above.

## 2022-06-20 ENCOUNTER — Ambulatory Visit: Payer: Medicare PPO | Admitting: Urology

## 2022-06-20 VITALS — BP 129/75 | HR 76

## 2022-06-20 DIAGNOSIS — C679 Malignant neoplasm of bladder, unspecified: Secondary | ICD-10-CM | POA: Diagnosis not present

## 2022-06-20 DIAGNOSIS — Z8551 Personal history of malignant neoplasm of bladder: Secondary | ICD-10-CM | POA: Diagnosis not present

## 2022-06-20 MED ORDER — CIPROFLOXACIN HCL 500 MG PO TABS
500.0000 mg | ORAL_TABLET | Freq: Once | ORAL | Status: AC
  Administered 2022-06-20: 500 mg via ORAL

## 2022-06-20 NOTE — Progress Notes (Unsigned)
   06/20/22  CC: followup bladder cancer   HPI: Briana Ochoa is a 82yo here for followup for bladder cancer Blood pressure 129/75, pulse 76. NED. A&Ox3.   No respiratory distress   Abd soft, NT, ND Normal external genitalia with patent urethral meatus  Cystoscopy Procedure Note  Patient identification was confirmed, informed consent was obtained, and patient was prepped using Betadine solution.  Lidocaine jelly was administered per urethral meatus.    Procedure: - Flexible cystoscope introduced, without any difficulty.   - Thorough search of the bladder revealed:    normal urethral meatus    normal urothelium    no stones    no ulcers     no tumors    no urethral polyps    no trabeculation  - Ureteral orifices were normal in position and appearance.  Post-Procedure: - Patient tolerated the procedure well  Assessment/ Plan: Followup 1 year for cystoscopy   No follow-ups on file.  Wilkie Aye, MD

## 2022-06-21 LAB — URINALYSIS, ROUTINE W REFLEX MICROSCOPIC
Bilirubin, UA: NEGATIVE
Glucose, UA: NEGATIVE
Ketones, UA: NEGATIVE
Leukocytes,UA: NEGATIVE
Nitrite, UA: NEGATIVE
Protein,UA: NEGATIVE
RBC, UA: NEGATIVE
Specific Gravity, UA: 1.015 (ref 1.005–1.030)
Urobilinogen, Ur: 0.2 mg/dL (ref 0.2–1.0)
pH, UA: 6.5 (ref 5.0–7.5)

## 2022-06-23 NOTE — Patient Instructions (Signed)

## 2022-09-06 ENCOUNTER — Ambulatory Visit (HOSPITAL_COMMUNITY)
Admission: RE | Admit: 2022-09-06 | Discharge: 2022-09-06 | Disposition: A | Discharge status: Home / Self Care | Payer: Medicare PPO | Source: Ambulatory Visit | Attending: Hematology | Admitting: Hematology

## 2022-09-06 ENCOUNTER — Encounter (HOSPITAL_COMMUNITY): Payer: Self-pay

## 2022-09-06 DIAGNOSIS — K5730 Diverticulosis of large intestine without perforation or abscess without bleeding: Secondary | ICD-10-CM | POA: Diagnosis not present

## 2022-09-06 DIAGNOSIS — N281 Cyst of kidney, acquired: Secondary | ICD-10-CM | POA: Diagnosis not present

## 2022-09-06 DIAGNOSIS — E785 Hyperlipidemia, unspecified: Secondary | ICD-10-CM | POA: Insufficient documentation

## 2022-09-06 DIAGNOSIS — R911 Solitary pulmonary nodule: Secondary | ICD-10-CM | POA: Diagnosis not present

## 2022-09-06 DIAGNOSIS — C186 Malignant neoplasm of descending colon: Secondary | ICD-10-CM | POA: Diagnosis not present

## 2022-09-06 DIAGNOSIS — Z85038 Personal history of other malignant neoplasm of large intestine: Secondary | ICD-10-CM | POA: Diagnosis not present

## 2022-09-06 DIAGNOSIS — E1169 Type 2 diabetes mellitus with other specified complication: Secondary | ICD-10-CM | POA: Diagnosis not present

## 2022-09-06 LAB — POCT I-STAT CREATININE: Creatinine, Ser: 1.6 mg/dL — ABNORMAL HIGH (ref 0.44–1.00)

## 2022-09-06 MED ORDER — IOHEXOL 300 MG/ML  SOLN
100.0000 mL | Freq: Once | INTRAMUSCULAR | Status: AC | PRN
  Administered 2022-09-06: 75 mL via INTRAVENOUS

## 2022-09-06 MED ORDER — IOHEXOL 9 MG/ML PO SOLN
ORAL | Status: AC
  Filled 2022-09-06: qty 1000

## 2022-09-06 MED ORDER — IOHEXOL 9 MG/ML PO SOLN
500.0000 mL | ORAL | Status: AC
  Administered 2022-09-06: 1000 mL via ORAL

## 2022-09-16 ENCOUNTER — Other Ambulatory Visit: Payer: Self-pay

## 2022-09-16 ENCOUNTER — Inpatient Hospital Stay: Payer: Medicare PPO | Attending: Hematology

## 2022-09-16 DIAGNOSIS — Z923 Personal history of irradiation: Secondary | ICD-10-CM | POA: Insufficient documentation

## 2022-09-16 DIAGNOSIS — Z85038 Personal history of other malignant neoplasm of large intestine: Secondary | ICD-10-CM | POA: Diagnosis not present

## 2022-09-16 DIAGNOSIS — R7989 Other specified abnormal findings of blood chemistry: Secondary | ICD-10-CM | POA: Diagnosis not present

## 2022-09-16 DIAGNOSIS — Z800 Family history of malignant neoplasm of digestive organs: Secondary | ICD-10-CM | POA: Diagnosis not present

## 2022-09-16 DIAGNOSIS — Z8051 Family history of malignant neoplasm of kidney: Secondary | ICD-10-CM | POA: Diagnosis not present

## 2022-09-16 DIAGNOSIS — Z86000 Personal history of in-situ neoplasm of breast: Secondary | ICD-10-CM | POA: Insufficient documentation

## 2022-09-16 DIAGNOSIS — Z8551 Personal history of malignant neoplasm of bladder: Secondary | ICD-10-CM | POA: Diagnosis not present

## 2022-09-16 DIAGNOSIS — Z803 Family history of malignant neoplasm of breast: Secondary | ICD-10-CM | POA: Insufficient documentation

## 2022-09-16 DIAGNOSIS — Z8049 Family history of malignant neoplasm of other genital organs: Secondary | ICD-10-CM | POA: Insufficient documentation

## 2022-09-16 DIAGNOSIS — E538 Deficiency of other specified B group vitamins: Secondary | ICD-10-CM

## 2022-09-16 DIAGNOSIS — C186 Malignant neoplasm of descending colon: Secondary | ICD-10-CM

## 2022-09-16 LAB — CBC WITH DIFFERENTIAL (CANCER CENTER ONLY)
Abs Immature Granulocytes: 0.05 10*3/uL (ref 0.00–0.07)
Basophils Absolute: 0.1 10*3/uL (ref 0.0–0.1)
Basophils Relative: 1 %
Eosinophils Absolute: 0.8 10*3/uL — ABNORMAL HIGH (ref 0.0–0.5)
Eosinophils Relative: 8 %
HCT: 42.5 % (ref 36.0–46.0)
Hemoglobin: 14.4 g/dL (ref 12.0–15.0)
Immature Granulocytes: 1 %
Lymphocytes Relative: 16 %
Lymphs Abs: 1.5 10*3/uL (ref 0.7–4.0)
MCH: 32.1 pg (ref 26.0–34.0)
MCHC: 33.9 g/dL (ref 30.0–36.0)
MCV: 94.9 fL (ref 80.0–100.0)
Monocytes Absolute: 1.1 10*3/uL — ABNORMAL HIGH (ref 0.1–1.0)
Monocytes Relative: 11 %
Neutro Abs: 6.2 10*3/uL (ref 1.7–7.7)
Neutrophils Relative %: 63 %
Platelet Count: 323 10*3/uL (ref 150–400)
RBC: 4.48 MIL/uL (ref 3.87–5.11)
RDW: 13.4 % (ref 11.5–15.5)
WBC Count: 9.7 10*3/uL (ref 4.0–10.5)
nRBC: 0 % (ref 0.0–0.2)

## 2022-09-16 LAB — CMP (CANCER CENTER ONLY)
ALT: 8 U/L (ref 0–44)
AST: 11 U/L — ABNORMAL LOW (ref 15–41)
Albumin: 3.7 g/dL (ref 3.5–5.0)
Alkaline Phosphatase: 76 U/L (ref 38–126)
Anion gap: 6 (ref 5–15)
BUN: 28 mg/dL — ABNORMAL HIGH (ref 8–23)
CO2: 27 mmol/L (ref 22–32)
Calcium: 9.4 mg/dL (ref 8.9–10.3)
Chloride: 107 mmol/L (ref 98–111)
Creatinine: 1.46 mg/dL — ABNORMAL HIGH (ref 0.44–1.00)
GFR, Estimated: 36 mL/min — ABNORMAL LOW (ref >60)
Glucose, Bld: 99 mg/dL (ref 70–99)
Potassium: 5.3 mmol/L — ABNORMAL HIGH (ref 3.5–5.1)
Sodium: 140 mmol/L (ref 135–145)
Total Bilirubin: 0.3 mg/dL (ref 0.3–1.2)
Total Protein: 6.8 g/dL (ref 6.5–8.1)

## 2022-09-16 LAB — VITAMIN B12: Vitamin B-12: 1188 pg/mL — ABNORMAL HIGH (ref 180–914)

## 2022-09-21 ENCOUNTER — Inpatient Hospital Stay: Payer: Medicare PPO | Admitting: Hematology

## 2022-09-21 ENCOUNTER — Encounter: Payer: Self-pay | Admitting: Hematology

## 2022-09-21 ENCOUNTER — Other Ambulatory Visit: Payer: Self-pay

## 2022-09-21 VITALS — BP 144/68 | HR 74 | Temp 97.6°F | Resp 18 | Ht 64.0 in | Wt 192.3 lb

## 2022-09-21 DIAGNOSIS — C186 Malignant neoplasm of descending colon: Secondary | ICD-10-CM | POA: Diagnosis not present

## 2022-09-21 DIAGNOSIS — Z8049 Family history of malignant neoplasm of other genital organs: Secondary | ICD-10-CM | POA: Diagnosis not present

## 2022-09-21 DIAGNOSIS — Z8051 Family history of malignant neoplasm of kidney: Secondary | ICD-10-CM | POA: Diagnosis not present

## 2022-09-21 DIAGNOSIS — Z86000 Personal history of in-situ neoplasm of breast: Secondary | ICD-10-CM | POA: Diagnosis not present

## 2022-09-21 DIAGNOSIS — Z85038 Personal history of other malignant neoplasm of large intestine: Secondary | ICD-10-CM | POA: Diagnosis not present

## 2022-09-21 DIAGNOSIS — Z803 Family history of malignant neoplasm of breast: Secondary | ICD-10-CM | POA: Diagnosis not present

## 2022-09-21 DIAGNOSIS — Z800 Family history of malignant neoplasm of digestive organs: Secondary | ICD-10-CM | POA: Diagnosis not present

## 2022-09-21 DIAGNOSIS — Z923 Personal history of irradiation: Secondary | ICD-10-CM | POA: Diagnosis not present

## 2022-09-21 DIAGNOSIS — R7989 Other specified abnormal findings of blood chemistry: Secondary | ICD-10-CM | POA: Diagnosis not present

## 2022-09-21 DIAGNOSIS — Z8551 Personal history of malignant neoplasm of bladder: Secondary | ICD-10-CM | POA: Diagnosis not present

## 2022-09-21 NOTE — Assessment & Plan Note (Signed)
pT3N0M0, Stage II, MSI-High -diagnosed on 04/23/20 by Colonoscopy performed for heme-positive stool and anemia. -resection with Dr Maisie Fus on 05/28/20 showed 9.7cm mass in splenic flexure with invasive adenocarcinoma, poorly differentiated, negative margins, negative LNs.  -GuardantReveal on 07/07/20 was positive -staging CT CAP w/o contrast on 08/31/20 showed NED. -She began Xeloda and Keytruda on 09/21/20. Xeloda held due to increased Cr and then low EGFR from 12/04/20 - 02/09/21, and Rande Lawman was held due to taste change/mouth numbness from 12/25/20 - 02/09/21. Completed Xeloda in 04/2021 and Keytruda on 09/17/21. -restaging CT from 09/06/2022, which showed no evidence of cancer recurrence, I personally reviewed the images with her  -she is clinically doing weil, will continue surveillance

## 2022-09-21 NOTE — Progress Notes (Signed)
[facility]   [phone] [phone]   Clinic Follow up Note   Patient Care Team: Nche, Bonna Gains, NP as PCP - General (Internal Medicine) Serena Croissant, MD as Consulting Physician (Hematology and Oncology) Micki Riley, MD as Consulting Physician (Neurology) Lisbeth Renshaw, MD as Consulting Physician (Neurosurgery) Antony Contras, MD as Consulting Physician (Ophthalmology) Malachy Mood, MD as Consulting Physician (Hematology and Oncology) Romie Levee, MD as Consulting Physician (General Surgery)  Date of Service:  09/21/2022  CHIEF COMPLAINT: f/u of colon cancer   CURRENT THERAPY:  Surveillance Oral B12 1000 mcg daily  ASSESSMENT:  Briana Ochoa is a 82 y.o. female with   Cancer of left colon (HCC) pT3N0M0, Stage II, MSI-High -diagnosed on 04/23/20 by Colonoscopy performed for heme-positive stool and anemia. -resection with Dr Maisie Fus on 05/28/20 showed 9.7cm mass in splenic flexure with invasive adenocarcinoma, poorly differentiated, negative margins, negative LNs.  -GuardantReveal on 07/07/20 was positive -staging CT CAP w/o contrast on 08/31/20 showed NED. -She began Xeloda and Keytruda on 09/21/20. Xeloda held due to increased Cr and then low EGFR from 12/04/20 - 02/09/21, and Rande Lawman was held due to taste change/mouth numbness from 12/25/20 - 02/09/21. Completed Xeloda in 04/2021 and Keytruda on 09/17/21. -restaging CT from 09/06/2022, which showed no evidence of cancer recurrence, I personally reviewed the images with her  -she is clinically doing weil, will continue surveillance     PLAN: - reviewed CT scan from 2 wks ago-no evidence of recurrence -lab reviewed B 12 is elevated, I recommend taking oral B12 every other day. -Continue Cancer Surveillance -repeat CT scan in 6 months, will order CT scan next visit. -Lab and f/u in 3 months  SUMMARY OF ONCOLOGIC HISTORY: Oncology History Overview Note  Cancer Staging Cancer of  left colon [facility]) Staging form: Colon and Rectum, AJCC 8th Edition - Pathologic stage from 05/28/2020: Stage IIA (pT3, pN0, cM0) - Signed by Malachy Mood, MD on 06/19/2020 Stage prefix: Initial diagnosis Total positive nodes: 0 Histologic grading system: 4 grade system Histologic grade (G): G3 Residual tumor (R): R0 - None  Ductal carcinoma in situ (DCIS) of left breast Staging form: Breast, AJCC 7th Edition - Clinical: Stage 0 (Tis (DCIS), N0, cM0) - Unsigned Specimen type: Core Needle Biopsy Histopathologic type: 9932 Laterality: Left Staging comments: Staged at breast conference 7.22.15  - Pathologic: Stage 0 (Tis (DCIS)(2), N0, cM0) - Unsigned Specimen type: Core Needle Biopsy Histopathologic type: 9932 Laterality: Left Tumor size (mm): 3 Multiple tumors: Yes Number of tumors: 2 Method of lymph node assessment: Clinical Method of detection of distant metastases: Clinical Residual tumor (R): R0 - None Estrogen receptor status: Positive Progesterone receptor status: Positive    Ductal carcinoma in situ (DCIS) of left breast  08/20/2013 Initial Diagnosis   Left breast 12:00: DCIS with calcifications, yet 100%, PR 100%; 09/04/2013 second group of calcifications also biopsied proven to be DCIS ER/PR positive   09/19/2013 Surgery   Left breast lumpectomy DCIS 2 foci margins negative, 0.2 and 0.3 cm ER 100% PR 100%   10/08/2013 - 11/12/2013 Radiation Therapy   Adjuvant radiation therapy   11/12/2013 - 2020 Anti-estrogen oral therapy   Anastrozole 1 mg daily plan is for 5 years   01/09/2014 - 01/10/2014 Hospital Admission   Pipeline embolization 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 The report date is 07/30/2020.  The CancerNext-Expanded + RNAinsight gene panel offered by [employer] and includes sequencing and rearrangement analysis for the following  77 genes: AIP, ALK, APC, ATM, AXIN2, BAP1, BARD1, BLM, BMPR1A, BRCA1, BRCA2, BRIP1, CDC73, CDH1, CDK4, CDKN1B, CDKN2A, CHEK2, CTNNA1, DICER1, FANCC, FH, FLCN, GALNT12, KIF1B, LZTR1, MAX, MEN1, MET, MLH1, MSH2, MSH3, MSH6, MUTYH, NBN, NF1, NF2, NTHL1, PALB2, PHOX2B, PMS2, POT1, PRKAR1A, PTCH1, PTEN, RAD51C, RAD51D, RB1, RECQL, RET, SDHA, SDHAF2, SDHB, SDHC, SDHD, SMAD4, SMARCA4, SMARCB1, SMARCE1, STK11, SUFU, TMEM127, TP53, TSC1, TSC2, VHL and XRCC2 (sequencing and deletion/duplication); EGFR, EGLN1, HOXB13, KIT, MITF, PDGFRA, POLD1 and POLE (sequencing only); EPCAM and GREM1 (deletion/duplication only). RNA data is routinely analyzed for use in variant interpretation for all genes.   Bladder cancer (HCC)  02/09/2018 Initial Diagnosis   Bladder cancer (HCC)   07/30/2020 Genetic Testing   Negative genetic testing:  No pathogenic variants detected on the [employer] CancerNext-Expanded + RNAinsight panel. The report date is 07/30/2020.   The CancerNext-Expanded + RNAinsight gene panel offered by [employer] and includes sequencing and rearrangement analysis for the following 77 genes: AIP, ALK, APC, ATM, AXIN2, BAP1, BARD1, BLM, BMPR1A, BRCA1, BRCA2, BRIP1, CDC73, CDH1, CDK4, CDKN1B, CDKN2A, CHEK2, CTNNA1, DICER1, FANCC, FH, FLCN, GALNT12, KIF1B, LZTR1, MAX, MEN1, MET, MLH1, MSH2, MSH3, MSH6, MUTYH, NBN, NF1, NF2, NTHL1, PALB2, PHOX2B, PMS2, POT1, PRKAR1A, PTCH1, PTEN, RAD51C, RAD51D, RB1, RECQL, RET, SDHA, SDHAF2, SDHB, SDHC, SDHD, SMAD4, SMARCA4, SMARCB1, SMARCE1, STK11, SUFU, TMEM127, TP53, TSC1, TSC2, VHL and XRCC2 (sequencing and deletion/duplication); EGFR, EGLN1, HOXB13, KIT, MITF, PDGFRA, POLD1 and POLE (sequencing only); EPCAM and GREM1 (deletion/duplication only). RNA data is routinely analyzed for use in variant interpretation for all genes.   08/31/2020 Imaging   CT CAP w/o contrast  IMPRESSION: 1. No specific findings of active malignancy on today's noncontrast CT. There has been interval partial  colectomy involving the tumor site with reanastomosis. Some faint stranding in the adjacent omentum is probably from scarring or fat necrosis, less likely due to early tumor given the lack of overt nodularity. Surveillance imaging is likely indicated, and PET-CT may have a role in imaging follow up. 2. Several small pulmonary nodules, in addition to a larger mixed density nodule in the left upper lobe. Surveillance is recommended. 3. Other imaging findings of potential clinical significance: Aortic Atherosclerosis (ICD10-I70.0). Coronary atherosclerosis. Mitral valve calcification. Small type 1 hiatal hernia. Splenectomy with small amount of regenerative splenic tissue as well as a venous varix arising from the splenic vein. Hyperdense left kidney lower pole exophytic homogeneous lesion, probably a complex cyst. Sigmoid colon diverticulosis. Multilevel lumbar spondylosis and degenerative disc disease.   Cancer of left colon (HCC)  04/23/2020 Procedure   Upper Endoscopy and Colonoscopy by Dr Leone Payor IMPRESSION Erythematous mucosa in the antrum. Biopsied. - Small hiatal hernia. - Gastroesophageal flap valve classified as Hill Grade IV (no fold, wide open lumen, hiatal hernia present). - The examination was otherwise normal.   IMPRESSION - Decreased sphincter tone found on digital rectal exam. - Malignant partially obstructing tumor in the proximal descending colon. Biopsied. Tattooed. - Three 5 to 20 mm polyps in the sigmoid colon, removed piecemeal using a cold snare. Resected and retrieved. - Severe diverticulosis in the sigmoid colon. - I left several diminutive descending (proximal to tattoo), sigmoid and rectal polyps - The examination was otherwise normal on direct and retroflexion views.     04/23/2020 Initial Diagnosis   Diagnosis 1. Descending Colon Polyp, mass - ADENOCARCINOMA. 2. Sigmoid Colon Polyp, (3) - MULTIPLE FRAGMENTS OF  TUBULAR ADENOMA. - MULTIPLE FRAGMENTS  OF SESSILE SERRATED POLYP WITHOUT DYSPLASIA. - NO HIGH GRADE DYSPLASIA OR MALIGNANCY. Microscopic Comment 1. Dr. Kenard Gower has reviewed the case. Dr. Leone Payor was notified on 04/24/2020. 2. Despite being left sided the fragments have features of a sessile serrated polyp, as opposed to, hyperplastic polyp.   04/27/2020 Imaging   CT AP IMPRESSION: Bulky annular soft tissue mass involving the distal transverse colon with extension into adjacent pericolonic fat, consistent with primary colon carcinoma.   No evidence of metastatic disease.   Colonic diverticulosis. No radiographic evidence of diverticulitis.   Aortic Atherosclerosis (ICD10-I70.0).     05/05/2020 Imaging   CT Chest IMPRESSION: Bulky annular soft tissue mass involving the distal transverse colon with extension into adjacent pericolonic fat, consistent with primary colon carcinoma.   No evidence of metastatic disease.   Colonic diverticulosis. No radiographic evidence of diverticulitis.   Aortic Atherosclerosis (ICD10-I70.0).     05/28/2020 Initial Diagnosis   Cancer of left colon (HCC)   05/28/2020 Cancer Staging   Staging form: Colon and Rectum, AJCC 8th Edition - Pathologic stage from 05/28/2020: Stage IIA (pT3, pN0, cM0) - Signed by Malachy Mood, MD on 06/19/2020 Stage prefix: Initial diagnosis Total positive nodes: 0 Histologic grading system: 4 grade system Histologic grade (G): G3 Residual tumor (R): R0 - None   05/28/2020 Surgery   XI ROBOT ASSISTED RESECTION OF SPLENIC FLEXURE by Dr Maisie Fus    FINAL MICROSCOPIC DIAGNOSIS:   A. COLON, SPLENIC FLEXURE, RESECTION:  - Invasive adenocarcinoma, poorly differentiated, spanning 9.7 cm.  - Tumor invades through muscularis propria into pericolonic tissue.  - Resection margins are negative.  - Tubular adenoma (X1).  - Sessile serrate polyp without dysplasia (x2).  - Diverticulosis.  - Polypectomy scar.  - Twenty-four of twenty-four lymph nodes negative for  carcinoma (0/24).  - See oncology table.    07/07/2020 Miscellaneous   Guardant Reveal ctDNA positive MSI high   07/30/2020 Genetic Testing   Negative genetic testing:  No pathogenic variants detected on the [employer] CancerNext-Expanded + RNAinsight panel. The report date is 07/30/2020.   The CancerNext-Expanded + RNAinsight gene panel offered by [employer] and includes sequencing and rearrangement analysis for the following 77 genes: AIP, ALK, APC, ATM, AXIN2, BAP1, BARD1, BLM, BMPR1A, BRCA1, BRCA2, BRIP1, CDC73, CDH1, CDK4, CDKN1B, CDKN2A, CHEK2, CTNNA1, DICER1, FANCC, FH, FLCN, GALNT12, KIF1B, LZTR1, MAX, MEN1, MET, MLH1, MSH2, MSH3, MSH6, MUTYH, NBN, NF1, NF2, NTHL1, PALB2, PHOX2B, PMS2, POT1, PRKAR1A, PTCH1, PTEN, RAD51C, RAD51D, RB1, RECQL, RET, SDHA, SDHAF2, SDHB, SDHC, SDHD, SMAD4, SMARCA4, SMARCB1, SMARCE1, STK11, SUFU, TMEM127, TP53, TSC1, TSC2, VHL and XRCC2 (sequencing and deletion/duplication); EGFR, EGLN1, HOXB13, KIT, MITF, PDGFRA, POLD1 and POLE (sequencing only); EPCAM and GREM1 (deletion/duplication only). RNA data is routinely analyzed for use in variant interpretation for all genes.   08/31/2020 Imaging   CT CAP w/o contrast  IMPRESSION: 1. No specific findings of active malignancy on today's noncontrast CT. There has been interval partial colectomy involving the tumor site with reanastomosis. Some faint stranding in the adjacent omentum is probably from scarring or fat necrosis, less likely due to early tumor given the lack of overt nodularity. Surveillance imaging is likely indicated, and PET-CT may have a role in imaging follow up. 2. Several small pulmonary nodules, in addition to a larger mixed density nodule in the left upper lobe. Surveillance is recommended. 3. Other imaging findings of potential clinical significance: Aortic Atherosclerosis (ICD10-I70.0). Coronary atherosclerosis. Mitral valve calcification. Small type 1 hiatal hernia.  Splenectomy with small  amount of regenerative splenic tissue as well as a venous varix arising from the splenic vein. Hyperdense left kidney lower pole exophytic homogeneous lesion, probably a complex cyst. Sigmoid colon diverticulosis. Multilevel lumbar spondylosis and degenerative disc disease.   10/02/2020 - 09/17/2021 Chemotherapy   Patient is on Treatment Plan : COLORECTAL Pembrolizumab q21d     03/01/2021 Imaging   EXAM: CT CHEST, ABDOMEN AND PELVIS WITHOUT CONTRAST  IMPRESSION: 1. Mixed attenuation lesion posterior left upper lobe has areas of more confluent and enlarging nodularity today. Interval progression raises concern for neoplasm. Consider PET-CT to further evaluate. 2. Stable 7 mm right lower lobe pulmonary nodule with a slightly more conspicuous 3 mm nodule in the right lower lobe. Continued close attention on follow-up recommended. 3. No evidence for metastatic disease in the abdomen or pelvis. 4. Aortic Atherosclerosis (ICD10-I70.0).   03/17/2021 PET scan   IMPRESSION: 1. Part solid nodule of the left upper lobe demonstrates mild FDG uptake, and has increased in size when compared with prior exam dated May 05, 2020. Concerning for indolent primary lung neoplasm. 2. No evidence of metastatic disease in the chest, abdomen or pelvis. 3. 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 Recommend further evaluation with tissue sampling. 4. Aortic Atherosclerosis (ICD10-I70.0).   03/15/2022 Imaging    IMPRESSION: Stable irregular solid pulmonary nodule in posterior left upper lobe.   No evidence of recurrent or metastatic disease within the abdomen or pelvis.   Colonic diverticulosis, without radiographic evidence of diverticulitis.      INTERVAL HISTORY:  Briana Ochoa is here for a follow up of colon cancer. She was last seen by me on 06/17/2022. She presents to the clinic alone. Pt state that she is doing well, she has no  concerns. Pt state that she has some knee pain and ankle pain only when she is mobile. She states that it nothing new and she takes Tylenol for the pain.      All other systems were reviewed with the patient and are negative.  MEDICAL HISTORY:  Past Medical History:  Diagnosis Date   Allergy    seasonal allergies   Anemia    on meds   Aortic atherosclerosis (HCC)    Bladder cancer (HCC) 2020   Bladder tumor    Bloody diarrhea 09/29/2017   Breast cancer (HCC) 2015   Left Breast Cancer   Cataract    bilateral -sx   CKD (chronic kidney disease), stage III (HCC)    DM related   Colon cancer (HCC)    Diabetes mellitus type 2, diet-controlled (HCC)    diet controlled   Dyspnea 09/28/2017   Dysuria    Family history of adverse reaction to anesthesia    aunt- N/V    Family history of breast cancer    Family history of kidney cancer    Family history of throat cancer    Family history of uterine cancer    Fatigue 09/28/2017   Frequency of urination    Grade I diastolic dysfunction 08/06/2014   Noted on ECHO   Gross hematuria 09/29/2017   Heart murmur    since rheumatic fever as child   Hematuria    History of cardiomegaly 02/12/2005   Mild, noted on CXR   History of CVA (cerebrovascular accident) 08/05/2014   post op cerebral angiogram, cerebral thrombosis w/ cerebral infarction;  11-02-2017  per pt no residuals   History of malignant melanoma of skin  12/2015   s/p wide local excision right lower leg (per pt localized)   History of rheumatic fever as a child    Hyperlipidemia    on meds   Hypertension    on meds   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   Sigmoid diverticulosis    Weakness 09/28/2017   Wears dentures    upper   Wears glasses    Wears hearing aid in both ears     SURGICAL HISTORY: Past Surgical History:  Procedure Laterality Date   BREAST LUMPECTOMY Left 2015   BREAST LUMPECTOMY WITH NEEDLE LOCALIZATION Left 09/19/2013   Procedure: LEFT BREAST LUMPECTOMY WITH DOUBLE WIRE BRACKETED  NEEDLE LOCALIZATION;  Surgeon: Ernestene Mention, MD;  Location: [facility] OR;  Service: General;  Laterality: Left;   BRONCHIAL BIOPSY  04/06/2021   Procedure: BRONCHIAL BIOPSIES;  Surgeon: Josephine Igo, DO;  Location: [facility];  Service: Pulmonary;;   BRONCHIAL BRUSHINGS  04/06/2021   Procedure: BRONCHIAL BRUSHINGS;  Surgeon: Josephine Igo, DO;  Location: [facility];  Service: Pulmonary;;   CATARACT EXTRACTION W/ INTRAOCULAR LENS  IMPLANT, BILATERAL  06/2015   CESAREAN SECTION  1964   CHOLECYSTECTOMY OPEN  1970s   CYSTOSCOPY W/ URETERAL STENT PLACEMENT Left 11/07/2017   Procedure: CYSTOSCOPY WITH RETROGRADE PYELOGRAM/URETERAL STENT PLACEMENT;  Surgeon: Malen Gauze, MD;  Location[facility];  Service: Urology;  Laterality: Left;   CYSTOSCOPY W/ URETERAL STENT PLACEMENT Bilateral 04/27/2018   Procedure: CYSTOSCOPY WITH RETROGRADE PYELOGRAM;  Surgeon: Malen Gauze, MD;  Location: [facility];  Service: Urology;  Laterality: Bilateral;   FINGER SURGERY     lt thumb  from dog bite   IR  NEPHROURETERAL CATH PLACE LEFT  04/28/2018   MELANOMA EXCISION Right 12/28/2015   Procedure: EXCISION MELANOMA RIGHT LOWER LEG;  Surgeon: Claud Kelp, MD;  Location: [facility];  Service: General;  Laterality: Right;   RADIOLOGY WITH  ANESTHESIA N/A 01/09/2014   Procedure: Embolization;  Surgeon: Lisbeth Renshaw, MD;  Location[facility];  Service: Radiology;  Laterality: N/A;   SPLENECTOMY, TOTAL  2009 approx.   splen injury due to fall off horse   TONSILLECTOMY  child   TRANSURETHRAL RESECTION OF BLADDER TUMOR N/A 11/07/2017   Procedure: TRANSURETHRAL RESECTION OF BLADDER TUMOR (TURBT), POSSIBLE STENT PLACEMENT;  Surgeon: Malen Gauze, MD;  Location: [facility];  Service: Urology;  Laterality: N/A;   TRANSURETHRAL RESECTION OF BLADDER TUMOR N/A 12/14/2017   Procedure: TRANSURETHRAL RESECTION OF BLADDER TUMOR (TURBT);  Surgeon: Malen Gauze, MD;  Location: [facility];  Service: Urology;  Laterality: N/A;  30 MINS   TRANSURETHRAL RESECTION OF BLADDER TUMOR N/A 04/27/2018   Procedure: TRANSURETHRAL RESECTION OF BLADDER TUMOR (TURBT);  Surgeon: Malen Gauze, MD;  Location: [facility];  Service: Urology;  Laterality: N/A;  30 MINS   VIDEO BRONCHOSCOPY WITH RADIAL ENDOBRONCHIAL ULTRASOUND  04/06/2021   Procedure: RADIAL ENDOBRONCHIAL ULTRASOUND;  Surgeon: Josephine Igo, DO;  Location: [facility];  Service: Pulmonary;;    I have reviewed the social history and family history with the patient and they are unchanged from previous note.  ALLERGIES:  is allergic to feraheme [ferumoxytol].  MEDICATIONS:  Current Outpatient Medications  Medication Sig Dispense Refill   acetaminophen (TYLENOL) 500 MG tablet Take 1,000 mg by mouth daily.     aspirin EC 81 MG tablet Take 81 mg by mouth daily. Swallow whole.     glucose blood (TRUE METRIX BLOOD GLUCOSE TEST) test strip Use as instructed to test blood sugar once daily E08.638 100 each 2   lisinopril (ZESTRIL) 20 MG tablet TAKE 1 TABLET EVERY DAY 90 tablet 3   Miconazole Nitrate-Wipes (MONISTAT 7 COMPLETE THERAPY) 100-2 MG-% KIT Place 1 kit vaginally once a week. (Patient not taking: Reported on 12/22/2021) 1 kit 1    pravastatin (PRAVACHOL) 10 MG tablet TAKE 1 TABLET EVERY DAY (STOP PRAVASTATIN 20MG ) 30 tablet 11   No current facility-administered medications for this visit.    PHYSICAL EXAMINATION: ECOG PERFORMANCE STATUS: 0 - Asymptomatic  Vitals:   09/21/22 0821  BP: (!) 144/68  Pulse: 74  Resp: 18  Temp: 97.6 F (36.4 C)  SpO2: 94%   Wt Readings from Last 3 Encounters:  09/21/22 192 lb 4.8 oz (87.2 kg)  06/17/22 191 lb 8 oz (86.9 kg)  03/17/22 186 lb 14.4 oz (84.8 kg)     GENERAL:alert, no distress and comfortable SKIN: skin color normal, no rashes or significant lesions EYES: normal, Conjunctiva are pink and non-injected, sclera clear  NEURO: alert & oriented x 3 with fluent speech LABORATORY DATA:  I have reviewed the data as listed    Latest Ref Rng & Units 09/16/2022   10:59 AM 06/17/2022    2:09 PM 03/15/2022   11:14 AM  CBC  WBC 4.0 - 10.5 K/uL 9.7  11.1  11.0   Hemoglobin 12.0 - 15.0 g/dL 16.1  09.6  04.5   Hematocrit 36.0 - 46.0 % 42.5  43.3  46.2   Platelets 150 - 400 K/uL 323  299  313         Latest Ref Rng & Units 09/16/2022   10:59 AM 09/06/2022    4:40 PM 06/17/2022    2:09 PM  CMP  Glucose 70 - 99 mg/dL 99   409   BUN 8 - 23 mg/dL 28   32   Creatinine 8.11 - 1.00 mg/dL 9.14  7.82  9.56   Sodium 135 - 145 mmol/L 140   138   Potassium 3.5 - 5.1 mmol/L 5.3   5.1   Chloride 98 - 111 mmol/L 107   107   CO2 22 - 32 mmol/L 27   25   Calcium 8.9 - 10.3 mg/dL 9.4   9.2   Total Protein 6.5 - 8.1 g/dL 6.8   6.9   Total Bilirubin 0.3 - 1.2 mg/dL 0.3   0.2   Alkaline Phos 38 - 126 U/L 76   87   AST 15 - 41 U/L 11   13   ALT 0 - 44 U/L 8   8       RADIOGRAPHIC STUDIES: I have personally reviewed the radiological images as listed and agreed with the findings in the report. No results found.    No orders  of the defined types were placed in this encounter.  All questions were answered. The patient knows to call the clinic with any problems, questions or concerns.  No barriers to learning was detected. The total time spent in the appointment was 25 minutes.     Malachy Mood, MD 09/21/2022   Carolin Coy, CMA, am acting as scribe for Malachy Mood, MD.   I have reviewed the above documentation for accuracy and completeness, and I agree with the above.

## 2022-12-15 DIAGNOSIS — H52203 Unspecified astigmatism, bilateral: Secondary | ICD-10-CM | POA: Diagnosis not present

## 2022-12-15 DIAGNOSIS — E119 Type 2 diabetes mellitus without complications: Secondary | ICD-10-CM | POA: Diagnosis not present

## 2022-12-15 DIAGNOSIS — H35373 Puckering of macula, bilateral: Secondary | ICD-10-CM | POA: Diagnosis not present

## 2022-12-15 DIAGNOSIS — Z961 Presence of intraocular lens: Secondary | ICD-10-CM | POA: Diagnosis not present

## 2022-12-15 LAB — HM DIABETES EYE EXAM

## 2022-12-21 ENCOUNTER — Encounter: Payer: Self-pay | Admitting: Hematology

## 2022-12-21 ENCOUNTER — Inpatient Hospital Stay: Payer: Medicare PPO | Attending: Hematology

## 2022-12-21 ENCOUNTER — Inpatient Hospital Stay: Payer: Medicare PPO | Admitting: Hematology

## 2022-12-21 VITALS — BP 145/77 | HR 74 | Temp 97.6°F | Resp 18 | Ht 64.0 in | Wt 192.1 lb

## 2022-12-21 DIAGNOSIS — Z923 Personal history of irradiation: Secondary | ICD-10-CM | POA: Diagnosis not present

## 2022-12-21 DIAGNOSIS — G629 Polyneuropathy, unspecified: Secondary | ICD-10-CM | POA: Insufficient documentation

## 2022-12-21 DIAGNOSIS — Z8051 Family history of malignant neoplasm of kidney: Secondary | ICD-10-CM | POA: Diagnosis not present

## 2022-12-21 DIAGNOSIS — E538 Deficiency of other specified B group vitamins: Secondary | ICD-10-CM

## 2022-12-21 DIAGNOSIS — Z8551 Personal history of malignant neoplasm of bladder: Secondary | ICD-10-CM | POA: Diagnosis not present

## 2022-12-21 DIAGNOSIS — Z803 Family history of malignant neoplasm of breast: Secondary | ICD-10-CM | POA: Diagnosis not present

## 2022-12-21 DIAGNOSIS — Z86000 Personal history of in-situ neoplasm of breast: Secondary | ICD-10-CM | POA: Insufficient documentation

## 2022-12-21 DIAGNOSIS — C186 Malignant neoplasm of descending colon: Secondary | ICD-10-CM | POA: Diagnosis not present

## 2022-12-21 DIAGNOSIS — Z800 Family history of malignant neoplasm of digestive organs: Secondary | ICD-10-CM | POA: Insufficient documentation

## 2022-12-21 DIAGNOSIS — Z8049 Family history of malignant neoplasm of other genital organs: Secondary | ICD-10-CM | POA: Diagnosis not present

## 2022-12-21 DIAGNOSIS — Z85038 Personal history of other malignant neoplasm of large intestine: Secondary | ICD-10-CM | POA: Insufficient documentation

## 2022-12-21 DIAGNOSIS — Z9221 Personal history of antineoplastic chemotherapy: Secondary | ICD-10-CM | POA: Insufficient documentation

## 2022-12-21 DIAGNOSIS — Z85820 Personal history of malignant melanoma of skin: Secondary | ICD-10-CM | POA: Diagnosis not present

## 2022-12-21 LAB — CBC WITH DIFFERENTIAL (CANCER CENTER ONLY)
Abs Immature Granulocytes: 0.03 10*3/uL (ref 0.00–0.07)
Basophils Absolute: 0.1 10*3/uL (ref 0.0–0.1)
Basophils Relative: 1 %
Eosinophils Absolute: 0.6 10*3/uL — ABNORMAL HIGH (ref 0.0–0.5)
Eosinophils Relative: 6 %
HCT: 46.7 % — ABNORMAL HIGH (ref 36.0–46.0)
Hemoglobin: 15.2 g/dL — ABNORMAL HIGH (ref 12.0–15.0)
Immature Granulocytes: 0 %
Lymphocytes Relative: 19 %
Lymphs Abs: 1.9 10*3/uL (ref 0.7–4.0)
MCH: 31 pg (ref 26.0–34.0)
MCHC: 32.5 g/dL (ref 30.0–36.0)
MCV: 95.3 fL (ref 80.0–100.0)
Monocytes Absolute: 1.1 10*3/uL — ABNORMAL HIGH (ref 0.1–1.0)
Monocytes Relative: 11 %
Neutro Abs: 6.2 10*3/uL (ref 1.7–7.7)
Neutrophils Relative %: 63 %
Platelet Count: 314 10*3/uL (ref 150–400)
RBC: 4.9 MIL/uL (ref 3.87–5.11)
RDW: 13.8 % (ref 11.5–15.5)
WBC Count: 10 10*3/uL (ref 4.0–10.5)
nRBC: 0 % (ref 0.0–0.2)

## 2022-12-21 LAB — CMP (CANCER CENTER ONLY)
ALT: 8 U/L (ref 0–44)
AST: 12 U/L — ABNORMAL LOW (ref 15–41)
Albumin: 4 g/dL (ref 3.5–5.0)
Alkaline Phosphatase: 84 U/L (ref 38–126)
Anion gap: 4 — ABNORMAL LOW (ref 5–15)
BUN: 30 mg/dL — ABNORMAL HIGH (ref 8–23)
CO2: 29 mmol/L (ref 22–32)
Calcium: 9.7 mg/dL (ref 8.9–10.3)
Chloride: 106 mmol/L (ref 98–111)
Creatinine: 1.43 mg/dL — ABNORMAL HIGH (ref 0.44–1.00)
GFR, Estimated: 37 mL/min — ABNORMAL LOW (ref >60)
Glucose, Bld: 90 mg/dL (ref 70–99)
Potassium: 5.2 mmol/L — ABNORMAL HIGH (ref 3.5–5.1)
Sodium: 139 mmol/L (ref 135–145)
Total Bilirubin: 0.4 mg/dL (ref <1.2)
Total Protein: 7 g/dL (ref 6.5–8.1)

## 2022-12-21 LAB — VITAMIN B12: Vitamin B-12: 933 pg/mL — ABNORMAL HIGH (ref 180–914)

## 2022-12-21 NOTE — Progress Notes (Signed)
[facility]   [phone] [phone]   Clinic Follow up Note   Patient Care Team: Nche, Bonna Gains, NP as PCP - General (Internal Medicine) Serena Croissant, MD as Consulting Physician (Hematology and Oncology) Micki Riley, MD as Consulting Physician (Neurology) Lisbeth Renshaw, MD as Consulting Physician (Neurosurgery) Antony Contras, MD as Consulting Physician (Ophthalmology) Malachy Mood, MD as Consulting Physician (Hematology and Oncology) Romie Levee, MD as Consulting Physician (General Surgery)  Date of Service:  12/21/2022  CHIEF COMPLAINT: f/u of colon cancer  CURRENT THERAPY:  Cancer surveillance  Oncology History   Cancer of left colon (HCC) pT3N0M0, Stage II, MSI-High -diagnosed on 04/23/20 by Colonoscopy performed for heme-positive stool and anemia. -resection with Dr Maisie Fus on 05/28/20 showed 9.7cm mass in splenic flexure with invasive adenocarcinoma, poorly differentiated, negative margins, negative LNs.  -GuardantReveal on 07/07/20 was positive -staging CT CAP w/o contrast on 08/31/20 showed NED. -She began Xeloda and Keytruda on 09/21/20. Xeloda held due to increased Cr and then low EGFR from 12/04/20 - 02/09/21, and Rande Lawman was held due to taste change/mouth numbness from 12/25/20 - 02/09/21. Completed Xeloda in 04/2021 and Keytruda on 09/17/21. -restaging CT from 09/06/2022, which showed no evidence of cancer recurrence, I personally reviewed the images with her  -she is clinically doing weil, will continue surveillance    Assessment and Plan    Colon Cancer 82 year old with colon cancer in remission. Last Keytruda treatment in August 2023. No new symptoms. Blood counts normal. Awaiting kidney and liver function tests. Occasional nocturnal numbness, non-persistent. Discussed moving to annual scans if next scan is stable. Risk of recurrence low, but regular monitoring essential. Prefers less frequent scans if stable. - Order CT  scan for next visit - Schedule follow-up in three months - Perform lab work and CT scan one week before next visit - Consider annual scans if next scan is stable  Peripheral Neuropathy Occasional nocturnal numbness and tingling, particularly when sleeping on one side. No significant impact on daily activities. - Monitor symptoms and report changes  General Health Maintenance Active, drives, and engages in daily activities. Takes oral B12 on Monday, Wednesday, and Friday. Monitors blood sugar, no diabetic medication. Takes lisinopril and cholesterol medication. Discussed discontinuing B12 if levels remain high. - Check B12 levels and consider discontinuing if high - Continue current medications - Use MyChart for communication and results  Follow-up - Schedule follow-up in three months - Order CT scan and lab work one week before next visit.         SUMMARY OF ONCOLOGIC HISTORY: Oncology History Overview Note  Cancer Staging Cancer of left colon [facility]) Staging form: Colon and Rectum, AJCC 8th Edition - Pathologic stage from 05/28/2020: Stage IIA (pT3, pN0, cM0) - Signed by Malachy Mood, MD on 06/19/2020 Stage prefix: Initial diagnosis Total positive nodes: 0 Histologic grading system: 4 grade system Histologic grade (G): G3 Residual tumor (R): R0 - None  Ductal carcinoma in situ (DCIS) of left breast Staging form: Breast, AJCC 7th Edition - Clinical: Stage 0 (Tis (DCIS), N0, cM0) - Unsigned Specimen type: Core Needle Biopsy Histopathologic type: 9932 Laterality: Left Staging comments: Staged at breast conference 7.22.15  - Pathologic: Stage 0 (Tis (DCIS)(2), N0, cM0) - Unsigned Specimen type: Core Needle Biopsy Histopathologic type: 9932 Laterality: Left Tumor size (mm): 3 Multiple tumors: Yes Number of tumors: 2 Method of lymph node assessment: Clinical Method of detection of distant metastases: Clinical Residual tumor (R): R0 - None Estrogen receptor status:  Positive Progesterone receptor status: Positive    Ductal carcinoma in situ (DCIS) of left breast  08/20/2013 Initial Diagnosis   Left breast 12:00: DCIS with calcifications, yet 100%, PR 100%; 09/04/2013 second group of calcifications also biopsied proven to be DCIS ER/PR positive   09/19/2013 Surgery   Left breast lumpectomy DCIS 2 foci margins negative, 0.2 and 0.3 cm ER 100% PR 100%   10/08/2013 - 11/12/2013 Radiation Therapy   Adjuvant radiation therapy   11/12/2013 - 2020 Anti-estrogen oral therapy   Anastrozole 1 mg daily plan is for 5 years   01/09/2014 - 01/10/2014 Hospital Admission   Pipeline embolization 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 The report date is 07/30/2020.   The CancerNext-Expanded + RNAinsight gene panel offered by [employer] and includes sequencing and rearrangement analysis for the following 77 genes: AIP, ALK, APC, ATM, AXIN2, BAP1, BARD1, BLM, BMPR1A, BRCA1, BRCA2, BRIP1, CDC73, CDH1, CDK4, CDKN1B, CDKN2A, CHEK2, CTNNA1, DICER1, FANCC, FH, FLCN, GALNT12, KIF1B, LZTR1, MAX, MEN1, MET, MLH1, MSH2, MSH3, MSH6, MUTYH, NBN, NF1, NF2, NTHL1, PALB2, PHOX2B, PMS2, POT1, PRKAR1A, PTCH1, PTEN, RAD51C, RAD51D, RB1, RECQL, RET, SDHA, SDHAF2, SDHB, SDHC, SDHD, SMAD4, SMARCA4, SMARCB1, SMARCE1, STK11, SUFU, TMEM127, TP53, TSC1, TSC2, VHL and XRCC2 (sequencing and deletion/duplication); EGFR, EGLN1, HOXB13, KIT, MITF, PDGFRA, POLD1 and POLE (sequencing only); EPCAM and GREM1 (deletion/duplication only). RNA data is routinely analyzed for use in variant interpretation for all genes.   Bladder cancer (HCC)  02/09/2018 Initial Diagnosis   Bladder cancer (HCC)   07/30/2020 Genetic Testing   Negative genetic testing:  No pathogenic variants detected on the [employer] CancerNext-Expanded + RNAinsight panel. The report date is 07/30/2020.   The  CancerNext-Expanded + RNAinsight gene panel offered by [employer] and includes sequencing and rearrangement analysis for the following 77 genes: AIP, ALK, APC, ATM, AXIN2, BAP1, BARD1, BLM, BMPR1A, BRCA1, BRCA2, BRIP1, CDC73, CDH1, CDK4, CDKN1B, CDKN2A, CHEK2, CTNNA1, DICER1, FANCC, FH, FLCN, GALNT12, KIF1B, LZTR1, MAX, MEN1, MET, MLH1, MSH2, MSH3, MSH6, MUTYH, NBN, NF1, NF2, NTHL1, PALB2, PHOX2B, PMS2, POT1, PRKAR1A, PTCH1, PTEN, RAD51C, RAD51D, RB1, RECQL, RET, SDHA, SDHAF2, SDHB, SDHC, SDHD, SMAD4, SMARCA4, SMARCB1, SMARCE1, STK11, SUFU, TMEM127, TP53, TSC1, TSC2, VHL and XRCC2 (sequencing and deletion/duplication); EGFR, EGLN1, HOXB13, KIT, MITF, PDGFRA, POLD1 and POLE (sequencing only); EPCAM and GREM1 (deletion/duplication only). RNA data is routinely analyzed for use in variant interpretation for all genes.   08/31/2020 Imaging   CT CAP w/o contrast  IMPRESSION: 1. No specific findings of active malignancy on today's noncontrast CT. There has been interval partial colectomy involving the tumor site with reanastomosis. Some faint stranding in the adjacent omentum is probably from scarring or fat necrosis, less likely due to early tumor given the lack of overt nodularity. Surveillance imaging is likely indicated, and PET-CT may have a role in imaging follow up. 2. Several small pulmonary nodules, in addition to a larger mixed density nodule in the left upper lobe. Surveillance is recommended. 3. Other imaging findings of potential clinical significance: Aortic Atherosclerosis (ICD10-I70.0). Coronary atherosclerosis. Mitral valve calcification. Small type 1 hiatal hernia. Splenectomy with small amount of regenerative splenic tissue as well as a venous varix arising from the splenic vein. Hyperdense left kidney lower pole exophytic homogeneous lesion, probably a complex cyst. Sigmoid colon diverticulosis. Multilevel lumbar spondylosis and degenerative disc disease.   Cancer of left colon  (HCC)  04/23/2020 Procedure  Upper Endoscopy and Colonoscopy by Dr Leone Payor IMPRESSION Erythematous mucosa in the antrum. Biopsied. - Small hiatal hernia. - Gastroesophageal flap valve classified as Hill Grade IV (no fold, wide open lumen, hiatal hernia present). - The examination was otherwise normal.   IMPRESSION - Decreased sphincter tone found on digital rectal exam. - Malignant partially obstructing tumor in the proximal descending colon. Biopsied. Tattooed. - Three 5 to 20 mm polyps in the sigmoid colon, removed piecemeal using a cold snare. Resected and retrieved. - Severe diverticulosis in the sigmoid colon. - I left several diminutive descending (proximal to tattoo), sigmoid and rectal polyps - The examination was otherwise normal on direct and retroflexion views.     04/23/2020 Initial Diagnosis   Diagnosis 1. Descending Colon Polyp, mass - ADENOCARCINOMA. 2. Sigmoid Colon Polyp, (3) - MULTIPLE FRAGMENTS OF TUBULAR ADENOMA. - MULTIPLE FRAGMENTS OF SESSILE SERRATED POLYP WITHOUT DYSPLASIA. - NO HIGH GRADE DYSPLASIA OR MALIGNANCY. Microscopic Comment 1. Dr. Kenard Gower has reviewed the case. Dr. Leone Payor was notified on 04/24/2020. 2. Despite being left sided the fragments have features of a sessile serrated polyp, as opposed to, hyperplastic polyp.   04/27/2020 Imaging   CT AP IMPRESSION: Bulky annular soft tissue mass involving the distal transverse colon with extension into adjacent pericolonic fat, consistent with primary colon carcinoma.   No evidence of metastatic disease.   Colonic diverticulosis. No radiographic evidence of diverticulitis.   Aortic Atherosclerosis (ICD10-I70.0).     05/05/2020 Imaging   CT Chest IMPRESSION: Bulky annular soft tissue mass involving the distal transverse colon with extension into adjacent pericolonic fat, consistent with primary colon carcinoma.   No evidence of metastatic disease.   Colonic diverticulosis. No  radiographic evidence of diverticulitis.   Aortic Atherosclerosis (ICD10-I70.0).     05/28/2020 Initial Diagnosis   Cancer of left colon (HCC)   05/28/2020 Cancer Staging   Staging form: Colon and Rectum, AJCC 8th Edition - Pathologic stage from 05/28/2020: Stage IIA (pT3, pN0, cM0) - Signed by Malachy Mood, MD on 06/19/2020 Stage prefix: Initial diagnosis Total positive nodes: 0 Histologic grading system: 4 grade system Histologic grade (G): G3 Residual tumor (R): R0 - None   05/28/2020 Surgery   XI ROBOT ASSISTED RESECTION OF SPLENIC FLEXURE by Dr Maisie Fus    FINAL MICROSCOPIC DIAGNOSIS:   A. COLON, SPLENIC FLEXURE, RESECTION:  - Invasive adenocarcinoma, poorly differentiated, spanning 9.7 cm.  - Tumor invades through muscularis propria into pericolonic tissue.  - Resection margins are negative.  - Tubular adenoma (X1).  - Sessile serrate polyp without dysplasia (x2).  - Diverticulosis.  - Polypectomy scar.  - Twenty-four of twenty-four lymph nodes negative for carcinoma (0/24).  - See oncology table.    07/07/2020 Miscellaneous   Guardant Reveal ctDNA positive MSI high   07/30/2020 Genetic Testing   Negative genetic testing:  No pathogenic variants detected on the [employer] CancerNext-Expanded + RNAinsight panel. The report date is 07/30/2020.   The CancerNext-Expanded + RNAinsight gene panel offered by [employer] and includes sequencing and rearrangement analysis for the following 77 genes: AIP, ALK, APC, ATM, AXIN2, BAP1, BARD1, BLM, BMPR1A, BRCA1, BRCA2, BRIP1, CDC73, CDH1, CDK4, CDKN1B, CDKN2A, CHEK2, CTNNA1, DICER1, FANCC, FH, FLCN, GALNT12, KIF1B, LZTR1, MAX, MEN1, MET, MLH1, MSH2, MSH3, MSH6, MUTYH, NBN, NF1, NF2, NTHL1, PALB2, PHOX2B, PMS2, POT1, PRKAR1A, PTCH1, PTEN, RAD51C, RAD51D, RB1, RECQL, RET, SDHA, SDHAF2, SDHB, SDHC, SDHD, SMAD4, SMARCA4, SMARCB1, SMARCE1, STK11, SUFU, TMEM127, TP53, TSC1, TSC2, VHL and XRCC2 (sequencing and deletion/duplication); EGFR, EGLN1, HOXB13,  KIT, MITF,  PDGFRA, POLD1 and POLE (sequencing only); EPCAM and GREM1 (deletion/duplication only). RNA data is routinely analyzed for use in variant interpretation for all genes.   08/31/2020 Imaging   CT CAP w/o contrast  IMPRESSION: 1. No specific findings of active malignancy on today's noncontrast CT. There has been interval partial colectomy involving the tumor site with reanastomosis. Some faint stranding in the adjacent omentum is probably from scarring or fat necrosis, less likely due to early tumor given the lack of overt nodularity. Surveillance imaging is likely indicated, and PET-CT may have a role in imaging follow up. 2. Several small pulmonary nodules, in addition to a larger mixed density nodule in the left upper lobe. Surveillance is recommended. 3. Other imaging findings of potential clinical significance: Aortic Atherosclerosis (ICD10-I70.0). Coronary atherosclerosis. Mitral valve calcification. Small type 1 hiatal hernia. Splenectomy with small amount of regenerative splenic tissue as well as a venous varix arising from the splenic vein. Hyperdense left kidney lower pole exophytic homogeneous lesion, probably a complex cyst. Sigmoid colon diverticulosis. Multilevel lumbar spondylosis and degenerative disc disease.   10/02/2020 - 09/17/2021 Chemotherapy   Patient is on Treatment Plan : COLORECTAL Pembrolizumab q21d     03/01/2021 Imaging   EXAM: CT CHEST, ABDOMEN AND PELVIS WITHOUT CONTRAST  IMPRESSION: 1. Mixed attenuation lesion posterior left upper lobe has areas of more confluent and enlarging nodularity today. Interval progression raises concern for neoplasm. Consider PET-CT to further evaluate. 2. Stable 7 mm right lower lobe pulmonary nodule with a slightly more conspicuous 3 mm nodule in the right lower lobe. Continued close attention on follow-up recommended. 3. No evidence for metastatic disease in the abdomen or pelvis. 4. Aortic Atherosclerosis  (ICD10-I70.0).   03/17/2021 PET scan   IMPRESSION: 1. Part solid nodule of the left upper lobe demonstrates mild FDG uptake, and has increased in size when compared with prior exam dated May 05, 2020. Concerning for indolent primary lung neoplasm. 2. No evidence of metastatic disease in the chest, abdomen or pelvis. 3. 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 Recommend further evaluation with tissue sampling. 4. Aortic Atherosclerosis (ICD10-I70.0).   03/15/2022 Imaging    IMPRESSION: Stable irregular solid pulmonary nodule in posterior left upper lobe.   No evidence of recurrent or metastatic disease within the abdomen or pelvis.   Colonic diverticulosis, without radiographic evidence of diverticulitis.      Discussed the use of AI scribe software for clinical note transcription with the patient, who gave verbal consent to proceed.  History of Present Illness   The patient, an 82 year old female with a history of colon cancer, presents for a routine follow-up. She reports feeling well and maintaining an active lifestyle, including working daily, managing household chores, and participating in church activities. She also recently purchased a new car, indicating continued independence and mobility.  The patient reports occasional numbness and tingling, particularly at night when sleeping on one side. However, these symptoms do not persist during the day. She also reports no issues with her stomach, and her sense of taste has returned to normal after previously being affected by [facility] treatment.  The patient is currently taking lisinopril and cholesterol medication, as well as oral B12 three times a week. She is not taking any diabetic medication, despite monitoring her sugar levels. She also reports not taking montanazo kit.         All other systems were reviewed with the patient and are negative.  MEDICAL HISTORY:   Past Medical  History:  Diagnosis Date   Allergy    seasonal allergies   Anemia    on meds   Aortic atherosclerosis (HCC)    Bladder cancer (HCC) 2020   Bladder tumor    Bloody diarrhea 09/29/2017   Breast cancer (HCC) 2015   Left Breast Cancer   Cataract    bilateral -sx   CKD (chronic kidney disease), stage III (HCC)    DM related   Colon cancer (HCC)    Diabetes mellitus type 2, diet-controlled (HCC)    diet controlled   Dyspnea 09/28/2017   Dysuria    Family history of adverse reaction to anesthesia    aunt- N/V    Family history of breast cancer    Family history of kidney cancer    Family history of throat cancer    Family history of uterine cancer    Fatigue 09/28/2017   Frequency of urination    Grade I diastolic dysfunction 08/06/2014   Noted on ECHO   Gross hematuria 09/29/2017   Heart murmur    since rheumatic fever as child   Hematuria    History of cardiomegaly 02/12/2005   Mild, noted on CXR   History of CVA (cerebrovascular accident) 08/05/2014   post op cerebral angiogram, cerebral thrombosis w/ cerebral infarction;  11-02-2017  per pt no residuals   History of malignant melanoma of skin 12/2015   s/p wide local excision right lower leg (per pt localized)   History of rheumatic fever as a child    Hyperlipidemia    on meds   Hypertension    on meds   Intracranial aneurysm dx 07/ 2015   paraophthalmic RICA aneurysm /   s/p  Pipeline embolization right ICA 01-09-2014   Jaundice 09/28/2017   Malignant neoplasm of central portion of left breast in female, estrogen receptor positive [facility]) oncologist-  dr Pamelia Hoit   dx 07/ 2015--- DCIS, ER/PR positive-- s/p  left breast lumpectomy 09-19-2013,  completed radiation 11-12-2013,  started arimidex 11-12-2013   Melanoma (HCC)    righ tleg    OA (osteoarthritis)    knees, hands, R shoulder   Osteopenia 10/16/2012   Diffuse   Personal history of radiation therapy    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   Sigmoid diverticulosis    Weakness 09/28/2017   Wears dentures    upper   Wears glasses    Wears hearing aid in both ears     SURGICAL HISTORY: Past Surgical History:  Procedure Laterality Date   BREAST LUMPECTOMY Left 2015   BREAST LUMPECTOMY WITH NEEDLE LOCALIZATION Left 09/19/2013   Procedure: LEFT BREAST LUMPECTOMY WITH DOUBLE WIRE BRACKETED  NEEDLE LOCALIZATION;  Surgeon: Ernestene Mention, MD;  Location: [facility] OR;  Service: General;  Laterality: Left;   BRONCHIAL BIOPSY  04/06/2021   Procedure: BRONCHIAL BIOPSIES;  Surgeon: Josephine Igo, DO;  Location: [facility];  Service: Pulmonary;;   BRONCHIAL BRUSHINGS  04/06/2021   Procedure: BRONCHIAL BRUSHINGS;  Surgeon: Josephine Igo, DO;  Location: [facility];  Service: Pulmonary;;   CATARACT EXTRACTION W/ INTRAOCULAR LENS  IMPLANT, BILATERAL  06/2015   CESAREAN SECTION  1964   CHOLECYSTECTOMY OPEN  1970s   CYSTOSCOPY W/ URETERAL STENT PLACEMENT Left 11/07/2017   Procedure: CYSTOSCOPY WITH RETROGRADE PYELOGRAM/URETERAL STENT PLACEMENT;  Surgeon: Malen Gauze, MD;  Location: [facility];  Service: Urology;  Laterality: Left;   CYSTOSCOPY W/ URETERAL STENT PLACEMENT Bilateral 04/27/2018  Procedure: CYSTOSCOPY WITH RETROGRADE PYELOGRAM;  Surgeon: Malen Gauze, MD;  Location: [facility];  Service: Urology;  Laterality: Bilateral;   FINGER SURGERY     lt thumb  from dog bite   IR  NEPHROURETERAL CATH PLACE LEFT  04/28/2018   MELANOMA EXCISION Right 12/28/2015   Procedure: EXCISION MELANOMA RIGHT LOWER LEG;  Surgeon: Claud Kelp, MD;  Location: [facility];  Service: General;  Laterality: Right;   RADIOLOGY WITH ANESTHESIA N/A 01/09/2014   Procedure: Embolization;  Surgeon: Lisbeth Renshaw, MD;  Location: [facility] OR;  Service: Radiology;  Laterality: N/A;   SPLENECTOMY, TOTAL  2009 approx.   splen injury due to fall off horse   TONSILLECTOMY  child   TRANSURETHRAL  RESECTION OF BLADDER TUMOR N/A 11/07/2017   Procedure: TRANSURETHRAL RESECTION OF BLADDER TUMOR (TURBT), POSSIBLE STENT PLACEMENT;  Surgeon: Malen Gauze, MD;  Location: [facility];  Service: Urology;  Laterality: N/A;   TRANSURETHRAL RESECTION OF BLADDER TUMOR N/A 12/14/2017   Procedure: TRANSURETHRAL RESECTION OF BLADDER TUMOR (TURBT);  Surgeon: Malen Gauze, MD;  Location: [facility];  Service: Urology;  Laterality: N/A;  30 MINS   TRANSURETHRAL RESECTION OF BLADDER TUMOR N/A 04/27/2018   Procedure: TRANSURETHRAL RESECTION OF BLADDER TUMOR (TURBT);  Surgeon: Malen Gauze, MD;  Location: [facility];  Service: Urology;  Laterality: N/A;  30 MINS   VIDEO BRONCHOSCOPY WITH RADIAL ENDOBRONCHIAL ULTRASOUND  04/06/2021   Procedure: RADIAL ENDOBRONCHIAL ULTRASOUND;  Surgeon: Josephine Igo, DO;  Location: [facility];  Service: Pulmonary;;    I have reviewed the social history and family history with the patient and they are unchanged from previous note.  ALLERGIES:  is allergic to feraheme [ferumoxytol].  MEDICATIONS:  Current Outpatient Medications  Medication Sig Dispense Refill   acetaminophen (TYLENOL) 500 MG tablet Take 1,000 mg by mouth daily.     aspirin EC 81 MG tablet Take 81 mg by mouth daily. Swallow whole.     glucose blood (TRUE METRIX BLOOD GLUCOSE TEST) test strip Use as instructed to test blood sugar once daily E08.638 100 each 2   lisinopril (ZESTRIL) 20 MG tablet TAKE 1 TABLET EVERY DAY 90 tablet 3   pravastatin (PRAVACHOL) 10 MG tablet TAKE 1 TABLET EVERY DAY (STOP PRAVASTATIN 20MG ) 30 tablet 11   No current facility-administered medications for this visit.    PHYSICAL EXAMINATION: ECOG PERFORMANCE STATUS: 0 - Asymptomatic  Vitals:   12/21/22 1146  BP: (!) 145/77  Pulse: 74  Resp: 18  Temp: 97.6 F (36.4 C)  SpO2: 96%   Wt Readings from Last 3 Encounters:  12/21/22 192 lb 1.6 oz (87.1 kg)   09/21/22 192 lb 4.8 oz (87.2 kg)  06/17/22 191 lb 8 oz (86.9 kg)     GENERAL:alert, no distress and comfortable SKIN: skin color, texture, turgor are normal, no rashes or significant lesions EYES: normal, Conjunctiva are pink and non-injected, sclera clear NECK: supple, thyroid normal size, non-tender, without nodularity LYMPH:  no palpable lymphadenopathy in the cervical, axillary  LUNGS: clear to auscultation and percussion with normal breathing effort HEART: regular rate & rhythm and no murmurs and no lower extremity edema ABDOMEN:abdomen soft, non-tender and normal bowel sounds Musculoskeletal:no cyanosis of digits and no clubbing  NEURO: alert & oriented x 3 with fluent speech, no focal motor/sensory deficits   LABORATORY DATA:  I have reviewed the data as listed    Latest Ref Rng & Units 12/21/2022  11:20 AM 09/16/2022   10:59 AM 06/17/2022    2:09 PM  CBC  WBC 4.0 - 10.5 K/uL 10.0  9.7  11.1   Hemoglobin 12.0 - 15.0 g/dL 78.4  69.6  29.5   Hematocrit 36.0 - 46.0 % 46.7  42.5  43.3   Platelets 150 - 400 K/uL 314  323  299         Latest Ref Rng & Units 12/21/2022   11:20 AM 09/16/2022   10:59 AM 09/06/2022    4:40 PM  CMP  Glucose 70 - 99 mg/dL 90  99    BUN 8 - 23 mg/dL 30  28    Creatinine 2.84 - 1.00 mg/dL 1.32  4.40  1.02   Sodium 135 - 145 mmol/L 139  140    Potassium 3.5 - 5.1 mmol/L 5.2  5.3    Chloride 98 - 111 mmol/L 106  107    CO2 22 - 32 mmol/L 29  27    Calcium 8.9 - 10.3 mg/dL 9.7  9.4    Total Protein 6.5 - 8.1 g/dL 7.0  6.8    Total Bilirubin <1.2 mg/dL 0.4  0.3    Alkaline Phos 38 - 126 U/L 84  76    AST 15 - 41 U/L 12  11    ALT 0 - 44 U/L 8  8        RADIOGRAPHIC STUDIES: I have personally reviewed the radiological images as listed and agreed with the findings in the report. No results found.    Orders Placed This Encounter  Procedures   CT CHEST ABDOMEN PELVIS W CONTRAST    Standing Status:   Future    Standing Expiration Date:    12/21/2023    Order Specific Question:   If indicated for the ordered procedure, I authorize the administration of contrast media per Radiology protocol    Answer:   Yes    Order Specific Question:   Does the patient have a contrast media/X-ray dye allergy?    Answer:   No    Order Specific Question:   Preferred imaging location?    Answer:   [facility]    Order Specific Question:   If indicated for the ordered procedure, I authorize the administration of oral contrast media per Radiology protocol    Answer:   Yes   All questions were answered. The patient knows to call the clinic with any problems, questions or concerns. No barriers to learning was detected. The total time spent in the appointment was 25 minutes.     Malachy Mood, MD 12/21/2022

## 2022-12-21 NOTE — Assessment & Plan Note (Signed)
pT3N0M0, Stage II, MSI-High -diagnosed on 04/23/20 by Colonoscopy performed for heme-positive stool and anemia. -resection with Dr Maisie Fus on 05/28/20 showed 9.7cm mass in splenic flexure with invasive adenocarcinoma, poorly differentiated, negative margins, negative LNs.  -GuardantReveal on 07/07/20 was positive -staging CT CAP w/o contrast on 08/31/20 showed NED. -She began Xeloda and Keytruda on 09/21/20. Xeloda held due to increased Cr and then low EGFR from 12/04/20 - 02/09/21, and Rande Lawman was held due to taste change/mouth numbness from 12/25/20 - 02/09/21. Completed Xeloda in 04/2021 and Keytruda on 09/17/21. -restaging CT from 09/06/2022, which showed no evidence of cancer recurrence, I personally reviewed the images with her  -she is clinically doing weil, will continue surveillance

## 2023-02-01 ENCOUNTER — Other Ambulatory Visit: Payer: Self-pay | Admitting: Nurse Practitioner

## 2023-02-01 DIAGNOSIS — I10 Essential (primary) hypertension: Secondary | ICD-10-CM

## 2023-02-26 ENCOUNTER — Other Ambulatory Visit: Payer: Self-pay | Admitting: Nurse Practitioner

## 2023-02-26 DIAGNOSIS — E782 Mixed hyperlipidemia: Secondary | ICD-10-CM

## 2023-03-16 ENCOUNTER — Ambulatory Visit (HOSPITAL_COMMUNITY): Payer: Medicare PPO

## 2023-03-16 ENCOUNTER — Inpatient Hospital Stay: Payer: Medicare PPO | Attending: Hematology

## 2023-03-16 DIAGNOSIS — Z85038 Personal history of other malignant neoplasm of large intestine: Secondary | ICD-10-CM | POA: Insufficient documentation

## 2023-03-17 ENCOUNTER — Other Ambulatory Visit: Payer: Self-pay

## 2023-03-27 NOTE — Assessment & Plan Note (Deleted)
[id number], Stage II, MSI-High -diagnosed on 04/23/20 by Colonoscopy performed for heme-positive stool and anemia. -resection with Dr Maisie Fus on 05/28/20 showed 9.7cm mass in splenic flexure with invasive adenocarcinoma, poorly differentiated, negative margins, negative LNs.  -GuardantReveal on 07/07/20 was positive -staging CT CAP w/o contrast on 08/31/20 showed NED. -She began Xeloda and Keytruda on 09/21/20. Xeloda held due to increased Cr and then low EGFR from 12/04/20 - 02/09/21, and Rande Lawman was held due to taste change/mouth numbness from 12/25/20 - 02/09/21. Completed Xeloda in 04/2021 and Keytruda on 09/17/21. -restaging CT from 09/06/2022, which showed no evidence of cancer recurrence, I personally reviewed the images with her  -she is clinically doing weil, will continue surveillance

## 2023-03-28 ENCOUNTER — Ambulatory Visit: Payer: Medicare PPO

## 2023-03-28 ENCOUNTER — Inpatient Hospital Stay: Payer: Medicare PPO | Admitting: Hematology

## 2023-03-28 DIAGNOSIS — C186 Malignant neoplasm of descending colon: Secondary | ICD-10-CM

## 2023-04-03 ENCOUNTER — Other Ambulatory Visit: Payer: Self-pay

## 2023-04-03 DIAGNOSIS — C186 Malignant neoplasm of descending colon: Secondary | ICD-10-CM

## 2023-04-03 DIAGNOSIS — E538 Deficiency of other specified B group vitamins: Secondary | ICD-10-CM

## 2023-04-04 ENCOUNTER — Ambulatory Visit (HOSPITAL_COMMUNITY)
Admission: RE | Admit: 2023-04-04 | Discharge: 2023-04-04 | Disposition: A | Discharge status: Home / Self Care | Payer: Medicare PPO | Source: Ambulatory Visit | Attending: Hematology | Admitting: Hematology

## 2023-04-04 ENCOUNTER — Inpatient Hospital Stay: Payer: Medicare PPO

## 2023-04-04 DIAGNOSIS — R918 Other nonspecific abnormal finding of lung field: Secondary | ICD-10-CM | POA: Diagnosis not present

## 2023-04-04 DIAGNOSIS — C186 Malignant neoplasm of descending colon: Secondary | ICD-10-CM

## 2023-04-04 DIAGNOSIS — K5730 Diverticulosis of large intestine without perforation or abscess without bleeding: Secondary | ICD-10-CM | POA: Diagnosis not present

## 2023-04-04 DIAGNOSIS — N281 Cyst of kidney, acquired: Secondary | ICD-10-CM | POA: Diagnosis not present

## 2023-04-04 DIAGNOSIS — Z85038 Personal history of other malignant neoplasm of large intestine: Secondary | ICD-10-CM | POA: Diagnosis not present

## 2023-04-04 DIAGNOSIS — E538 Deficiency of other specified B group vitamins: Secondary | ICD-10-CM

## 2023-04-04 LAB — CBC WITH DIFFERENTIAL (CANCER CENTER ONLY)
Abs Immature Granulocytes: 0.04 10*3/uL (ref 0.00–0.07)
Basophils Absolute: 0.1 10*3/uL (ref 0.0–0.1)
Basophils Relative: 1 %
Eosinophils Absolute: 0.5 10*3/uL (ref 0.0–0.5)
Eosinophils Relative: 5 %
HCT: 47 % — ABNORMAL HIGH (ref 36.0–46.0)
Hemoglobin: 15.4 g/dL — ABNORMAL HIGH (ref 12.0–15.0)
Immature Granulocytes: 0 %
Lymphocytes Relative: 26 %
Lymphs Abs: 2.6 10*3/uL (ref 0.7–4.0)
MCH: 31.2 pg (ref 26.0–34.0)
MCHC: 32.8 g/dL (ref 30.0–36.0)
MCV: 95.1 fL (ref 80.0–100.0)
Monocytes Absolute: 1.1 10*3/uL — ABNORMAL HIGH (ref 0.1–1.0)
Monocytes Relative: 11 %
Neutro Abs: 5.6 10*3/uL (ref 1.7–7.7)
Neutrophils Relative %: 57 %
Platelet Count: 307 10*3/uL (ref 150–400)
RBC: 4.94 MIL/uL (ref 3.87–5.11)
RDW: 13.5 % (ref 11.5–15.5)
WBC Count: 9.9 10*3/uL (ref 4.0–10.5)
nRBC: 0 % (ref 0.0–0.2)

## 2023-04-04 LAB — CMP (CANCER CENTER ONLY)
ALT: 8 U/L (ref 0–44)
AST: 14 U/L — ABNORMAL LOW (ref 15–41)
Albumin: 4.1 g/dL (ref 3.5–5.0)
Alkaline Phosphatase: 76 U/L (ref 38–126)
Anion gap: 7 (ref 5–15)
BUN: 21 mg/dL (ref 8–23)
CO2: 30 mmol/L (ref 22–32)
Calcium: 9.9 mg/dL (ref 8.9–10.3)
Chloride: 103 mmol/L (ref 98–111)
Creatinine: 1.45 mg/dL — ABNORMAL HIGH (ref 0.44–1.00)
GFR, Estimated: 36 mL/min — ABNORMAL LOW (ref >60)
Glucose, Bld: 91 mg/dL (ref 70–99)
Potassium: 4.8 mmol/L (ref 3.5–5.1)
Sodium: 140 mmol/L (ref 135–145)
Total Bilirubin: 0.4 mg/dL (ref 0.0–1.2)
Total Protein: 7.2 g/dL (ref 6.5–8.1)

## 2023-04-04 LAB — VITAMIN B12: Vitamin B-12: 792 pg/mL (ref 180–914)

## 2023-04-04 MED ORDER — IOHEXOL 300 MG/ML  SOLN
100.0000 mL | Freq: Once | INTRAMUSCULAR | Status: AC | PRN
  Administered 2023-04-04: 100 mL via INTRAVENOUS

## 2023-04-04 MED ORDER — IOHEXOL 300 MG/ML  SOLN
30.0000 mL | Freq: Once | INTRAMUSCULAR | Status: AC | PRN
  Administered 2023-04-04: 30 mL via ORAL

## 2023-04-08 NOTE — Assessment & Plan Note (Signed)
 [id number], Stage II, MSI-High -diagnosed on 04/23/20 by Colonoscopy performed for heme-positive stool and anemia. -resection with Dr Maisie Fus on 05/28/20 showed 9.7cm mass in splenic flexure with invasive adenocarcinoma, poorly differentiated, negative margins, negative LNs.  -GuardantReveal on 07/07/20 was positive -staging CT CAP w/o contrast on 08/31/20 showed NED. -She began Xeloda and Keytruda on 09/21/20. Xeloda held due to increased Cr and then low EGFR from 12/04/20 - 02/09/21, and Rande Lawman was held due to taste change/mouth numbness from 12/25/20 - 02/09/21. Completed Xeloda in 04/2021 and Keytruda on 09/17/21. -restaging CT from 09/06/2022, which showed no evidence of cancer recurrence, I personally reviewed the images with her  -restaging CT from 04/04/2023 showed Interval increase in size of the 11 mm posterior left upper lobe pulmonary nodule with a new 4 mm right lower lobe pulmonary nodule, will obtain PET and GuardantReveal for further evaluation

## 2023-04-12 ENCOUNTER — Encounter: Payer: Self-pay | Admitting: Hematology

## 2023-04-12 ENCOUNTER — Inpatient Hospital Stay: Payer: Medicare PPO | Attending: Hematology | Admitting: Hematology

## 2023-04-12 VITALS — BP 126/78 | HR 78 | Temp 97.3°F | Resp 18 | Ht 64.0 in | Wt 193.9 lb

## 2023-04-12 DIAGNOSIS — Z9221 Personal history of antineoplastic chemotherapy: Secondary | ICD-10-CM | POA: Insufficient documentation

## 2023-04-12 DIAGNOSIS — Z79899 Other long term (current) drug therapy: Secondary | ICD-10-CM | POA: Diagnosis not present

## 2023-04-12 DIAGNOSIS — I10 Essential (primary) hypertension: Secondary | ICD-10-CM

## 2023-04-12 DIAGNOSIS — E782 Mixed hyperlipidemia: Secondary | ICD-10-CM

## 2023-04-12 DIAGNOSIS — Z923 Personal history of irradiation: Secondary | ICD-10-CM | POA: Diagnosis not present

## 2023-04-12 DIAGNOSIS — R918 Other nonspecific abnormal finding of lung field: Secondary | ICD-10-CM | POA: Diagnosis not present

## 2023-04-12 DIAGNOSIS — Z803 Family history of malignant neoplasm of breast: Secondary | ICD-10-CM | POA: Diagnosis not present

## 2023-04-12 DIAGNOSIS — Z8551 Personal history of malignant neoplasm of bladder: Secondary | ICD-10-CM | POA: Insufficient documentation

## 2023-04-12 DIAGNOSIS — Z800 Family history of malignant neoplasm of digestive organs: Secondary | ICD-10-CM | POA: Insufficient documentation

## 2023-04-12 DIAGNOSIS — C186 Malignant neoplasm of descending colon: Secondary | ICD-10-CM

## 2023-04-12 DIAGNOSIS — Z85038 Personal history of other malignant neoplasm of large intestine: Secondary | ICD-10-CM | POA: Diagnosis not present

## 2023-04-12 DIAGNOSIS — M25552 Pain in left hip: Secondary | ICD-10-CM | POA: Insufficient documentation

## 2023-04-12 DIAGNOSIS — I129 Hypertensive chronic kidney disease with stage 1 through stage 4 chronic kidney disease, or unspecified chronic kidney disease: Secondary | ICD-10-CM | POA: Insufficient documentation

## 2023-04-12 DIAGNOSIS — Z8049 Family history of malignant neoplasm of other genital organs: Secondary | ICD-10-CM | POA: Insufficient documentation

## 2023-04-12 DIAGNOSIS — M25562 Pain in left knee: Secondary | ICD-10-CM | POA: Diagnosis not present

## 2023-04-12 DIAGNOSIS — M25551 Pain in right hip: Secondary | ICD-10-CM | POA: Diagnosis not present

## 2023-04-12 DIAGNOSIS — E785 Hyperlipidemia, unspecified: Secondary | ICD-10-CM | POA: Insufficient documentation

## 2023-04-12 DIAGNOSIS — M25561 Pain in right knee: Secondary | ICD-10-CM | POA: Insufficient documentation

## 2023-04-12 DIAGNOSIS — Z8051 Family history of malignant neoplasm of kidney: Secondary | ICD-10-CM | POA: Insufficient documentation

## 2023-04-12 DIAGNOSIS — N183 Chronic kidney disease, stage 3 unspecified: Secondary | ICD-10-CM | POA: Diagnosis not present

## 2023-04-12 DIAGNOSIS — Z86000 Personal history of in-situ neoplasm of breast: Secondary | ICD-10-CM | POA: Insufficient documentation

## 2023-04-12 MED ORDER — LISINOPRIL 20 MG PO TABS: 20.0000 mg | ORAL_TABLET | Freq: Every day | ORAL | 1 refills | Status: DC

## 2023-04-12 MED ORDER — PRAVASTATIN SODIUM 10 MG PO TABS
ORAL_TABLET | ORAL | 0 refills | Status: DC
Start: 2023-04-12 — End: 2023-07-11

## 2023-04-12 NOTE — Progress Notes (Signed)
 [facility]   [phone] [phone]   Clinic Follow up Note   Patient Care Team: Nche, Bonna Gains, NP as PCP - General (Internal Medicine) Serena Croissant, MD as Consulting Physician (Hematology and Oncology) Micki Riley, MD as Consulting Physician (Neurology) Lisbeth Renshaw, MD as Consulting Physician (Neurosurgery) Antony Contras, MD as Consulting Physician (Ophthalmology) Malachy Mood, MD as Consulting Physician (Hematology and Oncology) Romie Levee, MD as Consulting Physician (General Surgery)  Date of Service:  04/12/2023  CHIEF COMPLAINT: f/u of colon cancer  CURRENT THERAPY:  Surveillance  Oncology History   Cancer of left colon (HCC) pT3N0M0, Stage II, MSI-High -diagnosed on 04/23/20 by Colonoscopy performed for heme-positive stool and anemia. -resection with Dr Maisie Fus on 05/28/20 showed 9.7cm mass in splenic flexure with invasive adenocarcinoma, poorly differentiated, negative margins, negative LNs.  -GuardantReveal on 07/07/20 was positive -staging CT CAP w/o contrast on 08/31/20 showed NED. -She began Xeloda and Keytruda on 09/21/20. Xeloda held due to increased Cr and then low EGFR from 12/04/20 - 02/09/21, and Rande Lawman was held due to taste change/mouth numbness from 12/25/20 - 02/09/21. Completed Xeloda in 04/2021 and Keytruda on 09/17/21. -restaging CT from 09/06/2022, which showed no evidence of cancer recurrence, I personally reviewed the images with her  -restaging CT from 04/04/2023 showed Interval increase in size of the 11 mm posterior left upper lobe pulmonary nodule with a new 4 mm right lower lobe pulmonary nodule, she previously had a biopsy of the left upper lobe nodule which was negative. The nodule size has been fluctuating since 2023. Will continue monitoring.  Assessment and Plan    Colon Cancer 83 year old female, status post-surgery in April 2022, with no evidence of recurrence. Recent CT scan shows interval increase in  posterior left lower lobe nodule and a new right lower lobe pulmonary nodule. Previous biopsy of left upper lobe nodule in February 2023 was benign. Considering stability and previous biopsy results, nodules are likely benign, but continued surveillance is warranted. Discussed the need for a PET scan to further evaluate the nodules if necessary. Explained that the chance of recurrence is much less after three years post-surgery. - Order repeat CT chest in 3-4 months - Schedule follow-up appointment for the first week of July 2025  Lung Nodules Small lung nodules noted on recent CT scan with slight increase in size. Previous biopsy of this nodule was benign.  - Consider PET scan if nodules show significant changes on follow-up CT  Chronic Kidney Disease Stage 3 Chronic kidney disease stage 3, well-managed. Recent blood tests show normal kidney and liver function. - Continue monitoring kidney function  Osteoarthritis Reports joint pain, particularly in the knees and hips, exacerbated by wet weather. Likely osteoarthritis. - Recommend symptomatic management for arthritis  Hypertension Requires a refill of lisinopril for hypertension management. - Refill lisinopril for 3 months - Advise to schedule an appointment with her primary care physician within 3 months  Hyperlipidemia Requires a refill of pravastatin for hyperlipidemia management. - Refill pravastatin for 3 months - Advise to schedule an appointment with her primary care physician within 3 months  Plan -CT scan reviewed, overall no concern for recurrence.  Will continue to monitor her lung nodules. - Schedule follow-up appointment for the first week of July 2025 - Order repeat CT chest scan for the end of June 2025.         SUMMARY OF ONCOLOGIC HISTORY: Oncology History Overview Note  Cancer Staging Cancer of left colon [facility] Staging form:  Colon and Rectum, AJCC 8th Edition - Pathologic stage from 05/28/2020: Stage IIA  (pT3, pN0, cM0) - Signed by Malachy Mood, MD on 06/19/2020 Stage prefix: Initial diagnosis Total positive nodes: 0 Histologic grading system: 4 grade system Histologic grade (G): G3 Residual tumor (R): R0 - None  Ductal carcinoma in situ (DCIS) of left breast Staging form: Breast, AJCC 7th Edition - Clinical: Stage 0 (Tis (DCIS), N0, cM0) - Unsigned Specimen type: Core Needle Biopsy Histopathologic type: 9932 Laterality: Left Staging comments: Staged at breast conference 7.22.15  - Pathologic: Stage 0 (Tis (DCIS)(2), N0, cM0) - Unsigned Specimen type: Core Needle Biopsy Histopathologic type: 9932 Laterality: Left Tumor size (mm): 3 Multiple tumors: Yes Number of tumors: 2 Method of lymph node assessment: Clinical Method of detection of distant metastases: Clinical Residual tumor (R): R0 - None Estrogen receptor status: Positive Progesterone receptor status: Positive    Ductal carcinoma in situ (DCIS) of left breast  08/20/2013 Initial Diagnosis   Left breast 12:00: DCIS with calcifications, yet 100%, PR 100%; 09/04/2013 second group of calcifications also biopsied proven to be DCIS ER/PR positive   09/19/2013 Surgery   Left breast lumpectomy DCIS 2 foci margins negative, 0.2 and 0.3 cm ER 100% PR 100%   10/08/2013 - 11/12/2013 Radiation Therapy   Adjuvant radiation therapy   11/12/2013 - 2020 Anti-estrogen oral therapy   Anastrozole 1 mg daily plan is for 5 years   01/09/2014 - 01/10/2014 Hospital Admission   Pipeline embolization 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 The report date is 07/30/2020.   The CancerNext-Expanded + RNAinsight gene panel offered by [employer] and includes sequencing and rearrangement analysis for the following 77 genes: AIP, ALK, APC, ATM, AXIN2, BAP1, BARD1, BLM, BMPR1A, BRCA1, BRCA2, BRIP1, CDC73, CDH1, CDK4, CDKN1B,  CDKN2A, CHEK2, CTNNA1, DICER1, FANCC, FH, FLCN, GALNT12, KIF1B, LZTR1, MAX, MEN1, MET, MLH1, MSH2, MSH3, MSH6, MUTYH, NBN, NF1, NF2, NTHL1, PALB2, PHOX2B, PMS2, POT1, PRKAR1A, PTCH1, PTEN, RAD51C, RAD51D, RB1, RECQL, RET, SDHA, SDHAF2, SDHB, SDHC, SDHD, SMAD4, SMARCA4, SMARCB1, SMARCE1, STK11, SUFU, TMEM127, TP53, TSC1, TSC2, VHL and XRCC2 (sequencing and deletion/duplication); EGFR, EGLN1, HOXB13, KIT, MITF, PDGFRA, POLD1 and POLE (sequencing only); EPCAM and GREM1 (deletion/duplication only). RNA data is routinely analyzed for use in variant interpretation for all genes.   Bladder cancer (HCC)  02/09/2018 Initial Diagnosis   Bladder cancer (HCC)   07/30/2020 Genetic Testing   Negative genetic testing:  No pathogenic variants detected on the [employer] CancerNext-Expanded + RNAinsight panel. The report date is 07/30/2020.   The CancerNext-Expanded + RNAinsight gene panel offered by [employer] and includes sequencing and rearrangement analysis for the following 77 genes: AIP, ALK, APC, ATM, AXIN2, BAP1, BARD1, BLM, BMPR1A, BRCA1, BRCA2, BRIP1, CDC73, CDH1, CDK4, CDKN1B, CDKN2A, CHEK2, CTNNA1, DICER1, FANCC, FH, FLCN, GALNT12, KIF1B, LZTR1, MAX, MEN1, MET, MLH1, MSH2, MSH3, MSH6, MUTYH, NBN, NF1, NF2, NTHL1, PALB2, PHOX2B, PMS2, POT1, PRKAR1A, PTCH1, PTEN, RAD51C, RAD51D, RB1, RECQL, RET, SDHA, SDHAF2, SDHB, SDHC, SDHD, SMAD4, SMARCA4, SMARCB1, SMARCE1, STK11, SUFU, TMEM127, TP53, TSC1, TSC2, VHL and XRCC2 (sequencing and deletion/duplication); EGFR, EGLN1, HOXB13, KIT, MITF, PDGFRA, POLD1 and POLE (sequencing only); EPCAM and GREM1 (deletion/duplication only). RNA data is routinely analyzed for use in variant interpretation for all genes.   08/31/2020 Imaging   CT CAP w/o contrast  IMPRESSION: 1. No specific findings of active malignancy on today's noncontrast CT. There has been  interval partial colectomy involving the tumor site with reanastomosis. Some faint stranding in the adjacent omentum is  probably from scarring or fat necrosis, less likely due to early tumor given the lack of overt nodularity. Surveillance imaging is likely indicated, and PET-CT may have a role in imaging follow up. 2. Several small pulmonary nodules, in addition to a larger mixed density nodule in the left upper lobe. Surveillance is recommended. 3. Other imaging findings of potential clinical significance: Aortic Atherosclerosis (ICD10-I70.0). Coronary atherosclerosis. Mitral valve calcification. Small type 1 hiatal hernia. Splenectomy with small amount of regenerative splenic tissue as well as a venous varix arising from the splenic vein. Hyperdense left kidney lower pole exophytic homogeneous lesion, probably a complex cyst. Sigmoid colon diverticulosis. Multilevel lumbar spondylosis and degenerative disc disease.   Cancer of left colon (HCC)  04/23/2020 Procedure   Upper Endoscopy and Colonoscopy by Dr Leone Payor IMPRESSION Erythematous mucosa in the antrum. Biopsied. - Small hiatal hernia. - Gastroesophageal flap valve classified as Hill Grade IV (no fold, wide open lumen, hiatal hernia present). - The examination was otherwise normal.   IMPRESSION - Decreased sphincter tone found on digital rectal exam. - Malignant partially obstructing tumor in the proximal descending colon. Biopsied. Tattooed. - Three 5 to 20 mm polyps in the sigmoid colon, removed piecemeal using a cold snare. Resected and retrieved. - Severe diverticulosis in the sigmoid colon. - I left several diminutive descending (proximal to tattoo), sigmoid and rectal polyps - The examination was otherwise normal on direct and retroflexion views.     04/23/2020 Initial Diagnosis   Diagnosis 1. Descending Colon Polyp, mass - ADENOCARCINOMA. 2. Sigmoid Colon Polyp, (3) - MULTIPLE FRAGMENTS OF TUBULAR ADENOMA. - MULTIPLE FRAGMENTS OF SESSILE SERRATED POLYP WITHOUT DYSPLASIA. - NO HIGH GRADE DYSPLASIA OR MALIGNANCY. Microscopic  Comment 1. Dr. Kenard Gower has reviewed the case. Dr. Leone Payor was notified on 04/24/2020. 2. Despite being left sided the fragments have features of a sessile serrated polyp, as opposed to, hyperplastic polyp.   04/27/2020 Imaging   CT AP IMPRESSION: Bulky annular soft tissue mass involving the distal transverse colon with extension into adjacent pericolonic fat, consistent with primary colon carcinoma.   No evidence of metastatic disease.   Colonic diverticulosis. No radiographic evidence of diverticulitis.   Aortic Atherosclerosis (ICD10-I70.0).     05/05/2020 Imaging   CT Chest IMPRESSION: Bulky annular soft tissue mass involving the distal transverse colon with extension into adjacent pericolonic fat, consistent with primary colon carcinoma.   No evidence of metastatic disease.   Colonic diverticulosis. No radiographic evidence of diverticulitis.   Aortic Atherosclerosis (ICD10-I70.0).     05/28/2020 Initial Diagnosis   Cancer of left colon (HCC)   05/28/2020 Cancer Staging   Staging form: Colon and Rectum, AJCC 8th Edition - Pathologic stage from 05/28/2020: Stage IIA (pT3, pN0, cM0) - Signed by Malachy Mood, MD on 06/19/2020 Stage prefix: Initial diagnosis Total positive nodes: 0 Histologic grading system: 4 grade system Histologic grade (G): G3 Residual tumor (R): R0 - None   05/28/2020 Surgery   XI ROBOT ASSISTED RESECTION OF SPLENIC FLEXURE by Dr Maisie Fus    FINAL MICROSCOPIC DIAGNOSIS:   A. COLON, SPLENIC FLEXURE, RESECTION:  - Invasive adenocarcinoma, poorly differentiated, spanning 9.7 cm.  - Tumor invades through muscularis propria into pericolonic tissue.  - Resection margins are negative.  - Tubular adenoma (X1).  - Sessile serrate polyp without dysplasia (x2).  - Diverticulosis.  - Polypectomy scar.  - Twenty-four of twenty-four lymph nodes negative for carcinoma (  0/24).  - See oncology table.    07/07/2020 Miscellaneous   Guardant Reveal ctDNA  positive MSI high   07/30/2020 Genetic Testing   Negative genetic testing:  No pathogenic variants detected on the [employer] CancerNext-Expanded + RNAinsight panel. The report date is 07/30/2020.   The CancerNext-Expanded + RNAinsight gene panel offered by [employer] and includes sequencing and rearrangement analysis for the following 77 genes: AIP, ALK, APC, ATM, AXIN2, BAP1, BARD1, BLM, BMPR1A, BRCA1, BRCA2, BRIP1, CDC73, CDH1, CDK4, CDKN1B, CDKN2A, CHEK2, CTNNA1, DICER1, FANCC, FH, FLCN, GALNT12, KIF1B, LZTR1, MAX, MEN1, MET, MLH1, MSH2, MSH3, MSH6, MUTYH, NBN, NF1, NF2, NTHL1, PALB2, PHOX2B, PMS2, POT1, PRKAR1A, PTCH1, PTEN, RAD51C, RAD51D, RB1, RECQL, RET, SDHA, SDHAF2, SDHB, SDHC, SDHD, SMAD4, SMARCA4, SMARCB1, SMARCE1, STK11, SUFU, TMEM127, TP53, TSC1, TSC2, VHL and XRCC2 (sequencing and deletion/duplication); EGFR, EGLN1, HOXB13, KIT, MITF, PDGFRA, POLD1 and POLE (sequencing only); EPCAM and GREM1 (deletion/duplication only). RNA data is routinely analyzed for use in variant interpretation for all genes.   08/31/2020 Imaging   CT CAP w/o contrast  IMPRESSION: 1. No specific findings of active malignancy on today's noncontrast CT. There has been interval partial colectomy involving the tumor site with reanastomosis. Some faint stranding in the adjacent omentum is probably from scarring or fat necrosis, less likely due to early tumor given the lack of overt nodularity. Surveillance imaging is likely indicated, and PET-CT may have a role in imaging follow up. 2. Several small pulmonary nodules, in addition to a larger mixed density nodule in the left upper lobe. Surveillance is recommended. 3. Other imaging findings of potential clinical significance: Aortic Atherosclerosis (ICD10-I70.0). Coronary atherosclerosis. Mitral valve calcification. Small type 1 hiatal hernia. Splenectomy with small amount of regenerative splenic tissue as well as a venous varix arising from the splenic vein.  Hyperdense left kidney lower pole exophytic homogeneous lesion, probably a complex cyst. Sigmoid colon diverticulosis. Multilevel lumbar spondylosis and degenerative disc disease.   10/02/2020 - 09/17/2021 Chemotherapy   Patient is on Treatment Plan : COLORECTAL Pembrolizumab q21d     03/01/2021 Imaging   EXAM: CT CHEST, ABDOMEN AND PELVIS WITHOUT CONTRAST  IMPRESSION: 1. Mixed attenuation lesion posterior left upper lobe has areas of more confluent and enlarging nodularity today. Interval progression raises concern for neoplasm. Consider PET-CT to further evaluate. 2. Stable 7 mm right lower lobe pulmonary nodule with a slightly more conspicuous 3 mm nodule in the right lower lobe. Continued close attention on follow-up recommended. 3. No evidence for metastatic disease in the abdomen or pelvis. 4. Aortic Atherosclerosis (ICD10-I70.0).   03/17/2021 PET scan   IMPRESSION: 1. Part solid nodule of the left upper lobe demonstrates mild FDG uptake, and has increased in size when compared with prior exam dated May 05, 2020. Concerning for indolent primary lung neoplasm. 2. No evidence of metastatic disease in the chest, abdomen or pelvis. 3. 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 Recommend further evaluation with tissue sampling. 4. Aortic Atherosclerosis (ICD10-I70.0).   03/15/2022 Imaging    IMPRESSION: Stable irregular solid pulmonary nodule in posterior left upper lobe.   No evidence of recurrent or metastatic disease within the abdomen or pelvis.   Colonic diverticulosis, without radiographic evidence of diverticulitis.      Discussed the use of AI scribe software for clinical note transcription with the patient, who gave verbal consent to proceed.  History of Present Illness   The patient, an 83 year old female with a history of colon cancer, presents for a routine  follow-up. She reports feeling well overall, with  no new symptoms such as cough or abdominal discomfort. She has a good appetite and has maintained a stable weight. She does report some joint discomfort, which she attributes to arthritis, particularly in wet weather. She has no new symptoms or concerns since her last visit.  The patient had a CT scan last week, which showed a small lung nodule that appears to have grown slightly since the last scan. She recalls having a biopsy on this spot over a year ago. The patient also has a history of chronic kidney disease, which is currently stable. She is taking B12 supplements, which have helped to normalize her previously high B12 levels.         All other systems were reviewed with the patient and are negative.  MEDICAL HISTORY:  Past Medical History:  Diagnosis Date   Allergy    seasonal allergies   Anemia    on meds   Aortic atherosclerosis (HCC)    Bladder cancer (HCC) 2020   Bladder tumor    Bloody diarrhea 09/29/2017   Breast cancer (HCC) 2015   Left Breast Cancer   Cataract    bilateral -sx   CKD (chronic kidney disease), stage III (HCC)    DM related   Colon cancer (HCC)    Diabetes mellitus type 2, diet-controlled (HCC)    diet controlled   Dyspnea 09/28/2017   Dysuria    Family history of adverse reaction to anesthesia    aunt- N/V    Family history of breast cancer    Family history of kidney cancer    Family history of throat cancer    Family history of uterine cancer    Fatigue 09/28/2017   Frequency of urination    Grade I diastolic dysfunction 08/06/2014   Noted on ECHO   Gross hematuria 09/29/2017   Heart murmur    since rheumatic fever as child   Hematuria    History of cardiomegaly 02/12/2005   Mild, noted on CXR   History of CVA (cerebrovascular accident) 08/05/2014   post op cerebral angiogram, cerebral thrombosis w/ cerebral infarction;  11-02-2017  per pt no residuals   History of malignant melanoma of skin 12/2015   s/p wide local excision right  lower leg (per pt localized)   History of rheumatic fever as a child    Hyperlipidemia    on meds   Hypertension    on meds   Intracranial aneurysm dx 07/ 2015   paraophthalmic RICA aneurysm /   s/p  Pipeline embolization right ICA 01-09-2014   Jaundice 09/28/2017   Malignant neoplasm of central portion of left breast in female, estrogen receptor positive [facility]) oncologist-  dr Pamelia Hoit   dx 07/ 2015--- DCIS, ER/PR positive-- s/p  left breast lumpectomy 09-19-2013,  completed radiation 11-12-2013,  started arimidex 11-12-2013   Melanoma (HCC)    righ tleg    OA (osteoarthritis)    knees, hands, R shoulder   Osteopenia 10/16/2012   Diffuse   Personal history of radiation therapy    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   Sigmoid diverticulosis    Weakness 09/28/2017   Wears dentures    upper   Wears glasses    Wears hearing aid in both ears     SURGICAL HISTORY: Past Surgical History:  Procedure Laterality Date   BREAST LUMPECTOMY Left 2015   BREAST LUMPECTOMY WITH NEEDLE LOCALIZATION Left 09/19/2013   Procedure:  LEFT BREAST LUMPECTOMY WITH DOUBLE WIRE BRACKETED  NEEDLE LOCALIZATION;  Surgeon: Ernestene Mention, MD;  Location: [facility] OR;  Service: General;  Laterality: Left;   BRONCHIAL BIOPSY  04/06/2021   Procedure: BRONCHIAL BIOPSIES;  Surgeon: Josephine Igo, DO;  Location: [facility];  Service: Pulmonary;;   BRONCHIAL BRUSHINGS  04/06/2021   Procedure: BRONCHIAL BRUSHINGS;  Surgeon: Josephine Igo, DO;  Location: [facility];  Service: Pulmonary;;   CATARACT EXTRACTION W/ INTRAOCULAR LENS  IMPLANT, BILATERAL  06/2015   CESAREAN SECTION  1964   CHOLECYSTECTOMY OPEN  1970s   CYSTOSCOPY W/ URETERAL STENT PLACEMENT Left 11/07/2017   Procedure: CYSTOSCOPY WITH RETROGRADE PYELOGRAM/URETERAL STENT PLACEMENT;  Surgeon: Malen Gauze, MD;  Location: [facility];  Service: Urology;  Laterality: Left;   CYSTOSCOPY W/ URETERAL STENT PLACEMENT Bilateral  04/27/2018   Procedure: CYSTOSCOPY WITH RETROGRADE PYELOGRAM;  Surgeon: Malen Gauze, MD;  Location[facility];  Service: Urology;  Laterality: Bilateral;   FINGER SURGERY     lt thumb  from dog bite   IR  NEPHROURETERAL CATH PLACE LEFT  04/28/2018   MELANOMA EXCISION Right 12/28/2015   Procedure: EXCISION MELANOMA RIGHT LOWER LEG;  Surgeon: Claud Kelp, MD;  Location: [facility];  Service: General;  Laterality: Right;   RADIOLOGY WITH ANESTHESIA N/A 01/09/2014   Procedure: Embolization;  Surgeon: Lisbeth Renshaw, MD;  Location: [facility] OR;  Service: Radiology;  Laterality: N/A;   SPLENECTOMY, TOTAL  2009 approx.   splen injury due to fall off horse   TONSILLECTOMY  child   TRANSURETHRAL RESECTION OF BLADDER TUMOR N/A 11/07/2017   Procedure: TRANSURETHRAL RESECTION OF BLADDER TUMOR (TURBT), POSSIBLE STENT PLACEMENT;  Surgeon: Malen Gauze, MD;  Location: [facility];  Service: Urology;  Laterality: N/A;   TRANSURETHRAL RESECTION OF BLADDER TUMOR N/A 12/14/2017   Procedure: TRANSURETHRAL RESECTION OF BLADDER TUMOR (TURBT);  Surgeon: Malen Gauze, MD;  Location: [facility];  Service: Urology;  Laterality: N/A;  30 MINS   TRANSURETHRAL RESECTION OF BLADDER TUMOR N/A 04/27/2018   Procedure: TRANSURETHRAL RESECTION OF BLADDER TUMOR (TURBT);  Surgeon: Malen Gauze, MD;  Location: [facility];  Service: Urology;  Laterality: N/A;  30 MINS   VIDEO BRONCHOSCOPY WITH RADIAL ENDOBRONCHIAL ULTRASOUND  04/06/2021   Procedure: RADIAL ENDOBRONCHIAL ULTRASOUND;  Surgeon: Josephine Igo, DO;  Location: [facility];  Service: Pulmonary;;    I have reviewed the social history and family history with the patient and they are unchanged from previous note.  ALLERGIES:  is allergic to feraheme [ferumoxytol].  MEDICATIONS:  Current Outpatient Medications  Medication Sig Dispense Refill   acetaminophen (TYLENOL)  500 MG tablet Take 1,000 mg by mouth daily.     aspirin EC 81 MG tablet Take 81 mg by mouth daily. Swallow whole.     glucose blood (TRUE METRIX BLOOD GLUCOSE TEST) test strip Use as instructed to test blood sugar once daily E08.638 100 each 2   lisinopril (ZESTRIL) 20 MG tablet Take 1 tablet (20 mg total) by mouth daily. 90 tablet 1   pravastatin (PRAVACHOL) 10 MG tablet TAKE 1 TABLET EVERY DAY (STOP PRAVASTATIN 20MG ) 90 tablet 0   No current facility-administered medications for this visit.    PHYSICAL EXAMINATION: ECOG PERFORMANCE STATUS: 0 - Asymptomatic  Vitals:   04/12/23 1332  BP: 126/78  Pulse: 78  Resp: 18  Temp: (!) 97.3 F (36.3 C)  SpO2: 95%   Wt  Readings from Last 3 Encounters:  04/12/23 193 lb 14.4 oz (88 kg)  12/21/22 192 lb 1.6 oz (87.1 kg)  09/21/22 192 lb 4.8 oz (87.2 kg)     GENERAL:alert, no distress and comfortable SKIN: skin color, texture, turgor are normal, no rashes or significant lesions EYES: normal, Conjunctiva are pink and non-injected, sclera clear NECK: supple, thyroid normal size, non-tender, without nodularity LYMPH:  no palpable lymphadenopathy in the cervical, axillary  LUNGS: clear to auscultation and percussion with normal breathing effort HEART: regular rate & rhythm and no murmurs and no lower extremity edema ABDOMEN:abdomen soft, non-tender and normal bowel sounds Musculoskeletal:no cyanosis of digits and no clubbing  NEURO: alert & oriented x 3 with fluent speech, no focal motor/sensory deficits   LABORATORY DATA:  I have reviewed the data as listed    Latest Ref Rng & Units 04/04/2023    3:24 PM 12/21/2022   11:20 AM 09/16/2022   10:59 AM  CBC  WBC 4.0 - 10.5 K/uL 9.9  10.0  9.7   Hemoglobin 12.0 - 15.0 g/dL 16.1  09.6  04.5   Hematocrit 36.0 - 46.0 % 47.0  46.7  42.5   Platelets 150 - 400 K/uL 307  314  323         Latest Ref Rng & Units 04/04/2023    3:24 PM 12/21/2022   11:20 AM 09/16/2022   10:59 AM  CMP  Glucose 70 -  99 mg/dL 91  90  99   BUN 8 - 23 mg/dL 21  30  28    Creatinine 0.44 - 1.00 mg/dL 4.09  8.11  9.14   Sodium 135 - 145 mmol/L 140  139  140   Potassium 3.5 - 5.1 mmol/L 4.8  5.2  5.3   Chloride 98 - 111 mmol/L 103  106  107   CO2 22 - 32 mmol/L 30  29  27    Calcium 8.9 - 10.3 mg/dL 9.9  9.7  9.4   Total Protein 6.5 - 8.1 g/dL 7.2  7.0  6.8   Total Bilirubin 0.0 - 1.2 mg/dL 0.4  0.4  0.3   Alkaline Phos 38 - 126 U/L 76  84  76   AST 15 - 41 U/L 14  12  11    ALT 0 - 44 U/L 8  8  8        RADIOGRAPHIC STUDIES: I have personally reviewed the radiological images as listed and agreed with the findings in the report. No results found.    Orders Placed This Encounter  Procedures   CT Chest Wo Contrast    Standing Status:   Future    Expected Date:   07/06/2023    Expiration Date:   04/11/2024    Preferred imaging location?:   [facility]   All questions were answered. The patient knows to call the clinic with any problems, questions or concerns. No barriers to learning was detected. The total time spent in the appointment was 30 minutes.     Malachy Mood, MD 04/12/2023

## 2023-05-04 ENCOUNTER — Telehealth: Payer: Self-pay | Admitting: Nurse Practitioner

## 2023-05-04 NOTE — Telephone Encounter (Signed)
 See note

## 2023-06-19 ENCOUNTER — Other Ambulatory Visit: Payer: Medicare PPO | Admitting: Urology

## 2023-06-21 ENCOUNTER — Other Ambulatory Visit: Payer: Medicare PPO | Admitting: Urology

## 2023-06-25 ENCOUNTER — Other Ambulatory Visit: Payer: Self-pay

## 2023-06-25 ENCOUNTER — Emergency Department (HOSPITAL_COMMUNITY)
Admission: EM | Admit: 2023-06-25 | Discharge: 2023-06-25 | Disposition: A | Discharge status: Home / Self Care | Attending: Emergency Medicine | Admitting: Emergency Medicine

## 2023-06-25 ENCOUNTER — Emergency Department (HOSPITAL_COMMUNITY)

## 2023-06-25 ENCOUNTER — Other Ambulatory Visit: Payer: Self-pay | Admitting: Hematology

## 2023-06-25 ENCOUNTER — Encounter (HOSPITAL_COMMUNITY): Payer: Self-pay

## 2023-06-25 DIAGNOSIS — W1830XA Fall on same level, unspecified, initial encounter: Secondary | ICD-10-CM | POA: Diagnosis not present

## 2023-06-25 DIAGNOSIS — Z043 Encounter for examination and observation following other accident: Secondary | ICD-10-CM | POA: Diagnosis not present

## 2023-06-25 DIAGNOSIS — S0990XA Unspecified injury of head, initial encounter: Secondary | ICD-10-CM

## 2023-06-25 DIAGNOSIS — W19XXXA Unspecified fall, initial encounter: Secondary | ICD-10-CM

## 2023-06-25 DIAGNOSIS — I671 Cerebral aneurysm, nonruptured: Secondary | ICD-10-CM | POA: Diagnosis not present

## 2023-06-25 DIAGNOSIS — E782 Mixed hyperlipidemia: Secondary | ICD-10-CM

## 2023-06-25 DIAGNOSIS — S01111A Laceration without foreign body of right eyelid and periocular area, initial encounter: Secondary | ICD-10-CM | POA: Insufficient documentation

## 2023-06-25 MED ORDER — BACITRACIN ZINC 500 UNIT/GM EX OINT
TOPICAL_OINTMENT | Freq: Two times a day (BID) | CUTANEOUS | Status: DC
  Filled 2023-06-25: qty 0.9

## 2023-06-25 MED ORDER — LIDOCAINE-EPINEPHRINE (PF) 2 %-1:200000 IJ SOLN
20.0000 mL | Freq: Once | INTRAMUSCULAR | Status: AC
  Administered 2023-06-25: 20 mL
  Filled 2023-06-25: qty 20

## 2023-06-25 MED ORDER — LIDOCAINE-EPINEPHRINE-TETRACAINE (LET) TOPICAL GEL
3.0000 mL | Freq: Once | TOPICAL | Status: AC
  Administered 2023-06-25: 3 mL via TOPICAL
  Filled 2023-06-25: qty 3

## 2023-06-25 NOTE — Discharge Instructions (Signed)
 The sutures should fall out in 5 or 6 days.  Please follow-up with your doctor in 1 week for a wound check.  Please return for new or worsening symptoms.  Please keep the wound clean and dry.

## 2023-06-25 NOTE — ED Provider Notes (Signed)
 [facility] Emergency Department Provider Note MRN:  [id number]  Arrival date & time: 06/25/23     Chief Complaint   Facial Laceration   History of Present Illness   Briana Ochoa is a 83 y.o. year-old female presents to the ED with chief complaint of fall and eyebrow laceration.  She states that she had a cramp in her leg and was trying to get out of bed, but got tangled up in the covers and hit her right eyebrow on the bedside table.  She denies LOC.  She takes aspirin  daily, but is not otherwise anticoagulated.  She denies any other injuries.  Bleeding controlled prior to arrival.  History provided by patient.   Review of Systems  Pertinent positive and negative review of systems noted in HPI.    Physical Exam   Vitals:   06/25/23 0314  BP: (!) 157/100  Pulse: 75  Resp: 20  Temp: 98.2 F (36.8 C)  SpO2: 97%    CONSTITUTIONAL:  non toxic-appearing, NAD NEURO:  Alert and oriented x 3, CN 3-12 grossly intact EYES:  eyes equal and reactive ENT/NECK:  Supple, no stridor  CARDIO:  normal rate, regular rhythm, appears well-perfused  PULM:  No respiratory distress,  GI/GU:  non-distended,  MSK/SPINE:  No gross deformities, no edema, moves all extremities  SKIN:  2 cm right eyebrow laceration   *Additional and/or pertinent findings included in MDM below  Diagnostic and Interventional Summary    EKG Interpretation Date/Time:    Ventricular Rate:    PR Interval:    QRS Duration:    QT Interval:    QTC Calculation:   R Axis:      Text Interpretation:         Labs Reviewed - No data to display  CT HEAD WO CONTRAST ( )  Final Result      Medications  lidocaine -EPINEPHrine  (XYLOCAINE  W/EPI) 2 %-1:200000 (PF) injection 20 mL (has no administration in time range)  bacitracin ointment (has no administration in time range)  lidocaine -EPINEPHrine -tetracaine (LET) topical gel (3 mLs Topical Given 06/25/23 0442)     Procedures  /   Critical Care .Laceration Repair  Date/Time: 06/25/2023 5:22 AM  Performed by: Sherel Dikes, PA-C Authorized by: Sherel Dikes, PA-C   Consent:    Consent obtained:  Verbal   Consent given by:  Patient   Risks, benefits, and alternatives were discussed: yes     Risks discussed:  Infection, pain, poor cosmetic result, poor wound healing, nerve damage, need for additional repair and vascular damage   Alternatives discussed:  No treatment Universal protocol:    Procedure explained and questions answered to patient or proxy's satisfaction: yes     Relevant documents present and verified: yes     Test results available: yes     Imaging studies available: yes     Required blood products, implants, devices, and special equipment available: yes     Site/side marked: yes     Immediately prior to procedure, a time out was called: yes     Patient identity confirmed:  Verbally with patient Anesthesia:    Anesthesia method:  Local infiltration and topical application   Topical anesthetic:  LET   Local anesthetic:  Lidocaine  1% WITH epi Laceration details:    Location:  Face   Face location:  R eyebrow   Length (cm):  3 Exploration:    Contaminated: no   Treatment:    Area cleansed with:  Saline Skin  repair:    Repair method:  Sutures   Suture size:  5-0 (vircryl rapide)   Suture technique:  Simple interrupted   Number of sutures:  7 Approximation:    Approximation:  Close Repair type:    Repair type:  Simple Post-procedure details:    Dressing:  Antibiotic ointment   ED Course and Medical Decision Making  I have reviewed the triage vital signs, the nursing notes, and pertinent available records from the EMR.  Social Determinants Affecting Complexity of Care: Patient has no clinically significant social determinants affecting this chief complaint..   ED Course:    Medical Decision Making Patient here with fall out of bed and laceration to the right eyebrow.  This was  repaired at the bedside.  CT head showed no acute intracranial trauma or skull fracture.  PCP follow-up recommended.  Amount and/or Complexity of Data Reviewed Radiology: ordered.  Risk OTC drugs. Prescription drug management.         Consultants: No consultations were needed in caring for this patient.   Treatment and Plan: Emergency department workup does not suggest an emergent condition requiring admission or immediate intervention beyond  what has been performed at this time. The patient is safe for discharge and has  been instructed to return immediately for worsening symptoms, change in  symptoms or any other concerns    Final Clinical Impressions(s) / ED Diagnoses     ICD-10-CM   1. Laceration of right eyebrow, initial encounter  S01.111A     2. Injury of head, initial encounter  S09.90XA     3. Fall, initial encounter  [email or web address]       ED Discharge Orders     None         Discharge Instructions Discussed with and Provided to Patient:     Discharge Instructions      The sutures should fall out in 5 or 6 days.  Please follow-up with your doctor in 1 week for a wound check.  Please return for new or worsening symptoms.  Please keep the wound clean and dry.     Sherel Dikes, PA-C 06/25/23 0524    Lindle Rhea, MD 06/25/23 [phone]

## 2023-06-25 NOTE — ED Notes (Signed)
 Patient transported to CT

## 2023-06-25 NOTE — ED Triage Notes (Signed)
 Pt came in for a laceration on her face after falling. Pt stated she got tangled up in her blanket and fell. Pt presents with a laceration over her right eye.

## 2023-06-26 ENCOUNTER — Encounter: Payer: Self-pay | Admitting: Hematology

## 2023-07-10 ENCOUNTER — Ambulatory Visit: Payer: Self-pay

## 2023-07-10 NOTE — Telephone Encounter (Signed)
 Copied from CRM [id number]. Topic: Clinical - Red Word Triage >> Jul 10, 2023  3:40 PM Trula Gable C wrote: [facility] that prompted transfer to Nurse Triage: Patient fell a couple of days agos , started swelling in knee and down to her ankle    Chief Complaint: Left knee pain, feel  Symptoms: pain, swelling Frequency:  2 weeks ago Pertinent Negatives: Patient denies  Disposition: [] ED /[] Urgent Care (no appt availability in office) / [x] Appointment(In office/virtual)/ []  [facility]/ [] Home Care/ [] Refused Recommended Disposition /[] [facility]/ []  Follow-up with PCP Additional Notes: agrees with appointment.  Reason for Disposition  Large swelling or bruise (> 2 inches or 5 cm)  Answer Assessment - Initial Assessment Questions 1. MECHANISM: "How did the injury happen?" (e.g., twisting injury, direct blow)      Briana Ochoa out of bed 2. ONSET: "When did the injury happen?" (Minutes or hours ago)      2 weeks ago 3. LOCATION: "Where is the injury located?"      left 4. APPEARANCE of INJURY: "What does the injury look like?"      swollen 5. SEVERITY: "Can you put weight on that leg?" "Can you walk?"      Yes 6. SIZE: For cuts, bruises, or swelling, ask: "How large is it?" (e.g., inches or centimeters;  entire joint)      N/a 7. PAIN: "Is there pain?" If Yes, ask: "How bad is the pain?"  "What does it keep you from doing?" (e.g., Scale 1-10; or mild, moderate, severe)   -  NONE: (0): no pain   -  MILD (1-3): doesn't interfere with normal activities    -  MODERATE (4-7): interferes with normal activities (e.g., work or school) or awakens from sleep, limping    -  SEVERE (8-10): excruciating pain, unable to do any normal activities, unable to walk     4-5 8. TETANUS: For any breaks in the skin, ask: "When was the last tetanus booster?"     N/a 9. OTHER SYMPTOMS: "Do you have any other symptoms?"  (e.g., "pop" when knee injured, swelling, locking, buckling)      no 10.  PREGNANCY: "Is there any chance you are pregnant?" "When was your last menstrual period?"       No  Protocols used: Knee Injury-A-AH

## 2023-07-10 NOTE — Telephone Encounter (Signed)
 Noted. Patient scheduled for an appointment with Dr. Tilmon Font tomorrow. Thanks, BMS/CMA

## 2023-07-11 ENCOUNTER — Ambulatory Visit: Admitting: Nurse Practitioner

## 2023-07-11 ENCOUNTER — Encounter: Payer: Self-pay | Admitting: Nurse Practitioner

## 2023-07-11 ENCOUNTER — Ambulatory Visit (INDEPENDENT_AMBULATORY_CARE_PROVIDER_SITE_OTHER)
Admission: RE | Admit: 2023-07-11 | Discharge: 2023-07-11 | Disposition: A | Discharge status: Home / Self Care | Source: Ambulatory Visit | Attending: Nurse Practitioner

## 2023-07-11 VITALS — BP 130/74 | HR 72 | Temp 97.0°F | Ht 64.0 in | Wt 196.2 lb

## 2023-07-11 DIAGNOSIS — W19XXXD Unspecified fall, subsequent encounter: Secondary | ICD-10-CM | POA: Diagnosis not present

## 2023-07-11 DIAGNOSIS — N1832 Chronic kidney disease, stage 3b: Secondary | ICD-10-CM | POA: Diagnosis not present

## 2023-07-11 DIAGNOSIS — E782 Mixed hyperlipidemia: Secondary | ICD-10-CM

## 2023-07-11 DIAGNOSIS — E785 Hyperlipidemia, unspecified: Secondary | ICD-10-CM

## 2023-07-11 DIAGNOSIS — M19072 Primary osteoarthritis, left ankle and foot: Secondary | ICD-10-CM | POA: Diagnosis not present

## 2023-07-11 DIAGNOSIS — M25572 Pain in left ankle and joints of left foot: Secondary | ICD-10-CM

## 2023-07-11 DIAGNOSIS — M25562 Pain in left knee: Secondary | ICD-10-CM

## 2023-07-11 DIAGNOSIS — E1169 Type 2 diabetes mellitus with other specified complication: Secondary | ICD-10-CM | POA: Diagnosis not present

## 2023-07-11 DIAGNOSIS — J42 Unspecified chronic bronchitis: Secondary | ICD-10-CM | POA: Diagnosis not present

## 2023-07-11 DIAGNOSIS — M1712 Unilateral primary osteoarthritis, left knee: Secondary | ICD-10-CM | POA: Diagnosis not present

## 2023-07-11 DIAGNOSIS — M7732 Calcaneal spur, left foot: Secondary | ICD-10-CM | POA: Diagnosis not present

## 2023-07-11 DIAGNOSIS — M7989 Other specified soft tissue disorders: Secondary | ICD-10-CM | POA: Diagnosis not present

## 2023-07-11 DIAGNOSIS — I10 Essential (primary) hypertension: Secondary | ICD-10-CM | POA: Diagnosis not present

## 2023-07-11 MED ORDER — PRAVASTATIN SODIUM 10 MG PO TABS
ORAL_TABLET | ORAL | 3 refills | Status: DC
Start: 2023-07-11 — End: 2023-07-12

## 2023-07-11 MED ORDER — LISINOPRIL 20 MG PO TABS: 20.0000 mg | ORAL_TABLET | Freq: Every day | ORAL | 3 refills | Status: AC

## 2023-07-11 NOTE — Assessment & Plan Note (Signed)
No cough or SOB. ?

## 2023-07-11 NOTE — Assessment & Plan Note (Signed)
 Repeat LDL Maintain pravastatin  dose

## 2023-07-11 NOTE — Patient Instructions (Signed)
 Go to lab Go to [address] for x-ray

## 2023-07-11 NOTE — Assessment & Plan Note (Signed)
 BP at goal with lisinopril  BP Readings from Last 3 Encounters:  07/11/23 130/74  06/25/23 (!) 140/65  04/12/23 126/78   Repeat BMP Maintain med dose F/up in 3months

## 2023-07-11 NOTE — Assessment & Plan Note (Signed)
 Positive bilateral neuropathy with hypertrophic toenails. Describes as wearing socks. Skin intact. Positive retinopathy Positive nephropathy- current use of ACE-I Current use of statin  Repeat hgbA1c, LDL, UACr, BMP

## 2023-07-11 NOTE — Progress Notes (Signed)
 Acute Office Visit  Subjective:    Patient ID: Briana Ochoa, female    DOB: May 10, 1940, 83 y.o.   MRN: [id number]  Chief Complaint  Patient presents with   Knee Pain    Pain and swelling of left knee    Knee Pain  The incident occurred more than 1 week ago. The incident occurred at home. The injury mechanism was a fall and a direct blow. The pain is present in the left knee and left ankle. The quality of the pain is described as aching. The pain is moderate. The pain has been Constant since onset. Pertinent negatives include no inability to bear weight, loss of motion, loss of sensation, muscle weakness, numbness or tingling. She reports no foreign bodies present. The symptoms are aggravated by movement, palpation and weight bearing. She has tried nothing for the symptoms.   Chronic bronchitis, unspecified chronic bronchitis type (HCC) No cough or SOB  HTN (hypertension) BP at goal with lisinopril  BP Readings from Last 3 Encounters:  07/11/23 130/74  06/25/23 (!) 140/65  04/12/23 126/78   Repeat BMP Maintain med dose F/up in 3months  Hyperlipidemia associated with type 2 diabetes mellitus (HCC) Repeat LDL Maintain pravastatin  dose  Diabetes mellitus (HCC) Positive bilateral neuropathy with hypertrophic toenails. Describes as wearing socks. Skin intact. Positive retinopathy Positive nephropathy- current use of ACE-I Current use of statin  Repeat hgbA1c, LDL, UACr, BMP  Outpatient Medications Prior to Visit  Medication Sig   acetaminophen  (TYLENOL ) 500 MG tablet Take 1,000 mg by mouth daily.   aspirin  EC 81 MG tablet Take 81 mg by mouth daily. Swallow whole.   Cyanocobalamin  (VITAMIN B 12 PO) Take 1 tablet by mouth 3 (three) times a week. Take 1 tablet by mouth 3 times a week. Take on Monday, Wednesday and Friday   glucose blood (TRUE METRIX BLOOD GLUCOSE TEST) test strip Use as instructed to test blood sugar once daily E08.638   [DISCONTINUED] lisinopril  (ZESTRIL )  20 MG tablet Take 1 tablet (20 mg total) by mouth daily.   [DISCONTINUED] pravastatin  (PRAVACHOL ) 10 MG tablet TAKE 1 TABLET EVERY DAY (STOP PRAVASTATIN  20MG )   No facility-administered medications prior to visit.    Reviewed past medical and social history.  Review of Systems  Neurological:  Negative for tingling and numbness.   Per HPI     Objective:    Physical Exam Vitals and nursing note reviewed.  Cardiovascular:     Rate and Rhythm: Normal rate and regular rhythm.     Pulses: Normal pulses.     Heart sounds: Normal heart sounds.  Pulmonary:     Effort: Pulmonary effort is normal.     Breath sounds: Normal breath sounds.  Musculoskeletal:        General: Swelling, tenderness and signs of injury present.     Right knee: Normal.     Left knee: Swelling and effusion present. No ecchymosis. Decreased range of motion. Tenderness present over the lateral joint line. No medial joint line or patellar tendon tenderness. Normal alignment.     Right lower leg: No edema.     Left lower leg: No lacerations or tenderness. Edema present.     Left ankle: Swelling present. Tenderness present over the lateral malleolus. No medial malleolus, ATF ligament, AITF ligament, CF ligament, posterior TF ligament, base of 5th metatarsal or proximal fibula tenderness. Normal range of motion. Normal pulse.     Left Achilles Tendon: Normal.     Left foot: Normal range  of motion. Swelling present. No tenderness.  Skin:    Findings: No erythema.  Neurological:     Mental Status: She is alert and oriented to person, place, and time.  Psychiatric:        Mood and Affect: Mood normal.        Behavior: Behavior normal.        Thought Content: Thought content normal.    BP 130/74 (BP Location: Left Arm, Patient Position: Sitting, Cuff Size: Large)   Pulse 72   Temp (!) 97 F (36.1 C) (Temporal)   Ht 5\' 4"  (1.626 m)   Wt 196 lb 3.2 oz (89 kg)   SpO2 96%   BMI 33.68 kg/m  BP Readings from Last 3  Encounters:  07/11/23 130/74  06/25/23 (!) 140/65  04/12/23 126/78   Wt Readings from Last 3 Encounters:  07/11/23 196 lb 3.2 oz (89 kg)  04/12/23 193 lb 14.4 oz (88 kg)  12/21/22 192 lb 1.6 oz (87.1 kg)      No results found for any visits on 07/11/23.     Assessment & Plan:   Problem List Items Addressed This Visit     Chronic bronchitis, unspecified chronic bronchitis type (HCC)   No cough or SOB      Diabetes mellitus (HCC)   Positive bilateral neuropathy with hypertrophic toenails. Describes as wearing socks. Skin intact. Positive retinopathy Positive nephropathy- current use of ACE-I Current use of statin  Repeat hgbA1c, LDL, UACr, BMP      Relevant Medications   lisinopril  (ZESTRIL ) 20 MG tablet   pravastatin  (PRAVACHOL ) 10 MG tablet   Other Relevant Orders   Basic metabolic panel with GFR   Microalbumin / creatinine urine ratio   Hemoglobin A1c   HLD (hyperlipidemia)   Relevant Medications   lisinopril  (ZESTRIL ) 20 MG tablet   pravastatin  (PRAVACHOL ) 10 MG tablet   HTN (hypertension)   BP at goal with lisinopril  BP Readings from Last 3 Encounters:  07/11/23 130/74  06/25/23 (!) 140/65  04/12/23 126/78   Repeat BMP Maintain med dose F/up in 3months      Relevant Medications   lisinopril  (ZESTRIL ) 20 MG tablet   pravastatin  (PRAVACHOL ) 10 MG tablet   Hyperlipidemia associated with type 2 diabetes mellitus (HCC)   Repeat LDL Maintain pravastatin  dose      Relevant Medications   lisinopril  (ZESTRIL ) 20 MG tablet   pravastatin  (PRAVACHOL ) 10 MG tablet   Other Relevant Orders   Direct LDL   Stage 3b chronic kidney disease (HCC)   Relevant Orders   Basic metabolic panel with GFR   Microalbumin / creatinine urine ratio   Other Visit Diagnoses       Fall, subsequent encounter    -  Primary   Relevant Orders   DG Knee Complete 4 Views Left   DG Ankle Complete Left     Acute pain of left knee       Relevant Orders   DG Knee Complete 4  Views Left     Acute left ankle pain       Relevant Orders   DG Ankle Complete Left     Hx of DVT and cancer. Consider venous doppler if normal x-ray? Advised to use thigh high compression stocking. Use tylenol  500-650mg  every 8hrs as needed for pain.  Meds ordered this encounter  Medications   lisinopril  (ZESTRIL ) 20 MG tablet    Sig: Take 1 tablet (20 mg total) by mouth daily.  Dispense:  90 tablet    Refill:  3    Supervising Provider:   Christianna Cowman ALFRED [[id number]]   pravastatin  (PRAVACHOL ) 10 MG tablet    Sig: TAKE 1 TABLET EVERY DAY (STOP PRAVASTATIN  20MG )    Dispense:  90 tablet    Refill:  3    Supervising Provider:   Tonna Frederic [[id number]]   Return in about 3 months (around 10/11/2023) for HTN, DM, hyperlipidemia (fasting).  Kathrene Parents, NP

## 2023-07-12 ENCOUNTER — Ambulatory Visit: Payer: Self-pay | Admitting: Nurse Practitioner

## 2023-07-12 DIAGNOSIS — S82892A Other fracture of left lower leg, initial encounter for closed fracture: Secondary | ICD-10-CM

## 2023-07-12 DIAGNOSIS — E782 Mixed hyperlipidemia: Secondary | ICD-10-CM

## 2023-07-12 DIAGNOSIS — M1712 Unilateral primary osteoarthritis, left knee: Secondary | ICD-10-CM

## 2023-07-12 LAB — BASIC METABOLIC PANEL WITH GFR
BUN: 26 mg/dL — ABNORMAL HIGH (ref 6–23)
CO2: 27 meq/L (ref 19–32)
Calcium: 9.6 mg/dL (ref 8.4–10.5)
Chloride: 105 meq/L (ref 96–112)
Creatinine, Ser: 1.44 mg/dL — ABNORMAL HIGH (ref 0.40–1.20)
GFR: 33.74 mL/min — ABNORMAL LOW (ref >60.00)
Glucose, Bld: 97 mg/dL (ref 70–99)
Potassium: 4.7 meq/L (ref 3.5–5.1)
Sodium: 140 meq/L (ref 135–145)

## 2023-07-12 LAB — LDL CHOLESTEROL, DIRECT: Direct LDL: 81 mg/dL

## 2023-07-12 LAB — HEMOGLOBIN A1C: Hgb A1c MFr Bld: 6.9 % — ABNORMAL HIGH (ref 4.6–6.5)

## 2023-07-12 MED ORDER — PRAVASTATIN SODIUM 20 MG PO TABS: 20.0000 mg | ORAL_TABLET | Freq: Every evening | ORAL | 1 refills | Status: DC

## 2023-07-17 ENCOUNTER — Encounter (HOSPITAL_BASED_OUTPATIENT_CLINIC_OR_DEPARTMENT_OTHER): Payer: Self-pay | Admitting: Student

## 2023-07-17 ENCOUNTER — Ambulatory Visit (HOSPITAL_BASED_OUTPATIENT_CLINIC_OR_DEPARTMENT_OTHER): Admitting: Student

## 2023-07-17 DIAGNOSIS — M25572 Pain in left ankle and joints of left foot: Secondary | ICD-10-CM

## 2023-07-17 DIAGNOSIS — M1712 Unilateral primary osteoarthritis, left knee: Secondary | ICD-10-CM

## 2023-07-17 MED ORDER — LIDOCAINE HCL 1 % IJ SOLN
4.0000 mL | INTRAMUSCULAR | Status: AC | PRN
  Administered 2023-07-17: 4 mL

## 2023-07-17 MED ORDER — TRIAMCINOLONE ACETONIDE 40 MG/ML IJ SUSP
2.0000 mL | INTRAMUSCULAR | Status: AC | PRN
  Administered 2023-07-17: 2 mL via INTRA_ARTICULAR

## 2023-07-17 NOTE — Progress Notes (Signed)
 Chief Complaint: Left knee and ankle pain     History of Present Illness:    Briana Ochoa is a 83 y.o. female presenting today for evaluation of her left ankle and knee.  Patient reports that she had a fall a few days ago, at which time she developed swelling in the knee down to the ankle.  She was seen by her primary care provider on 6/3 for x-rays which suggested a potential avulsion fracture within the left ankle.  Patient reports that the ankle is still swollen but is not causing her much pain.  She is having more pain on the medial aspect of the knee particularly walking.  Has been taking Tylenol  as needed.  Reports multiple falls within the last few months however does not recall ever previously injuring her left ankle.   Surgical History:   None  PMH/PSH/Family History/Social History/Meds/Allergies:    Past Medical History:  Diagnosis Date   Allergy    seasonal allergies   Anemia    on meds   Aortic atherosclerosis (HCC)    Bladder cancer (HCC) 2020   Bladder tumor    Bloody diarrhea 09/29/2017   Breast cancer (HCC) 2015   Left Breast Cancer   Cataract    bilateral -sx   CKD (chronic kidney disease), stage III (HCC)    DM related   Colon cancer (HCC)    Diabetes mellitus type 2, diet-controlled (HCC)    diet controlled   Dyspnea 09/28/2017   Dysuria    Family history of adverse reaction to anesthesia    aunt- N/V    Family history of breast cancer    Family history of kidney cancer    Family history of throat cancer    Family history of uterine cancer    Fatigue 09/28/2017   Frequency of urination    Grade I diastolic dysfunction 08/06/2014   Noted on ECHO   Gross hematuria 09/29/2017   Heart murmur    since rheumatic fever as child   Hematuria    History of cardiomegaly 02/12/2005   Mild, noted on CXR   History of CVA (cerebrovascular accident) 08/05/2014   post op cerebral angiogram, cerebral thrombosis w/  cerebral infarction;  11-02-2017  per pt no residuals   History of malignant melanoma of skin 12/2015   s/p wide local excision right lower leg (per pt localized)   History of rheumatic fever as a child    Hyperlipidemia    on meds   Hypertension    on meds   Intracranial aneurysm dx 07/ 2015   paraophthalmic RICA aneurysm /   s/p  Pipeline embolization right ICA 01-09-2014   Jaundice 09/28/2017   Malignant neoplasm of central portion of left breast in female, estrogen receptor positive [facility]) oncologist-  dr Lee Public   dx 07/ 2015--- DCIS, ER/PR positive-- s/p  left breast lumpectomy 09-19-2013,  completed radiation 11-12-2013,  started arimidex  11-12-2013   Melanoma (HCC)    righ tleg    OA (osteoarthritis)    knees, hands, R shoulder   Osteopenia 10/16/2012   Diffuse   Personal history of radiation therapy    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   Sigmoid diverticulosis    Weakness 09/28/2017   Wears dentures    upper  Wears glasses    Wears hearing aid in both ears    Past Surgical History:  Procedure Laterality Date   BREAST LUMPECTOMY Left 2015   BREAST LUMPECTOMY WITH NEEDLE LOCALIZATION Left 09/19/2013   Procedure: LEFT BREAST LUMPECTOMY WITH DOUBLE WIRE BRACKETED  NEEDLE LOCALIZATION;  Surgeon: Levert Ready, MD;  Location: [facility] OR;  Service: General;  Laterality: Left;   BRONCHIAL BIOPSY  04/06/2021   Procedure: BRONCHIAL BIOPSIES;  Surgeon: Prudy Brownie, DO;  Location: [facility];  Service: Pulmonary;;   BRONCHIAL BRUSHINGS  04/06/2021   Procedure: BRONCHIAL BRUSHINGS;  Surgeon: Prudy Brownie, DO;  Location: [facility];  Service: Pulmonary;;   CATARACT EXTRACTION W/ INTRAOCULAR LENS  IMPLANT, BILATERAL  06/2015   CESAREAN SECTION  1964   CHOLECYSTECTOMY OPEN  1970s   CYSTOSCOPY W/ URETERAL STENT PLACEMENT Left 11/07/2017   Procedure: CYSTOSCOPY WITH RETROGRADE PYELOGRAM/URETERAL STENT PLACEMENT;  Surgeon: Marco Severs, MD;  Location: [facility];  Service: Urology;  Laterality: Left;   CYSTOSCOPY W/ URETERAL STENT PLACEMENT Bilateral 04/27/2018   Procedure: CYSTOSCOPY WITH RETROGRADE PYELOGRAM;  Surgeon: Marco Severs, MD;  Location: [facility];  Service: Urology;  Laterality: Bilateral;   FINGER SURGERY     lt thumb  from dog bite   IR  NEPHROURETERAL CATH PLACE LEFT  04/28/2018   MELANOMA EXCISION Right 12/28/2015   Procedure: EXCISION MELANOMA RIGHT LOWER LEG;  Surgeon: Boyce Byes, MD;  Location: [facility];  Service: General;  Laterality: Right;   RADIOLOGY WITH ANESTHESIA N/A 01/09/2014   Procedure: Embolization;  Surgeon: Augusto Blonder, MD;  Location: [facility] OR;  Service: Radiology;  Laterality: N/A;   SPLENECTOMY, TOTAL  2009 approx.   splen injury due to fall off horse   TONSILLECTOMY  child   TRANSURETHRAL RESECTION OF BLADDER TUMOR N/A 11/07/2017   Procedure: TRANSURETHRAL RESECTION OF BLADDER TUMOR (TURBT), POSSIBLE STENT PLACEMENT;  Surgeon: Marco Severs, MD;  Location[facility];  Service: Urology;  Laterality: N/A;   TRANSURETHRAL RESECTION OF BLADDER TUMOR N/A 12/14/2017   Procedure: TRANSURETHRAL RESECTION OF BLADDER TUMOR (TURBT);  Surgeon: Marco Severs, MD;  Location: [facility];  Service: Urology;  Laterality: N/A;  30 MINS   TRANSURETHRAL RESECTION OF BLADDER TUMOR N/A 04/27/2018   Procedure: TRANSURETHRAL RESECTION OF BLADDER TUMOR (TURBT);  Surgeon: Marco Severs, MD;  Location: [facility];  Service: Urology;  Laterality: N/A;  30 MINS   VIDEO BRONCHOSCOPY WITH RADIAL ENDOBRONCHIAL ULTRASOUND  04/06/2021   Procedure: RADIAL ENDOBRONCHIAL ULTRASOUND;  Surgeon: Prudy Brownie, DO;  Location: [facility];  Service: Pulmonary;;   Social History   Socioeconomic History   Marital status: Widowed    Spouse name: Not on file   Number of children: 4   Years of education: Not on file   Highest  education level: Not on file  Occupational History    Comment: Retired  Tobacco Use   Smoking status: Former    Current packs/day: 0.00    Average packs/day: 1.5 packs/day for 40.0 years (60.0 ttl pk-yrs)    Types: Cigarettes    Start date: 03/06/1967    Quit date: 03/06/2007    Years since quitting: 16.3   Smokeless tobacco: Never  Vaping Use   Vaping status: Never Used  Substance and Sexual Activity   Alcohol use: No   Drug use: Never   Sexual activity: Not on file  Other Topics Concern  Not on file  Social History Narrative   Widowed 2013 originally from [place]  moved to [place] decades ago to work for [employer]   5 children   17 grandchildren      Does not have a living will-desires CPR, does not want prolonged life support if futile.   Social Drivers of Corporate investment banker Strain: Not on file  Food Insecurity: Unknown (04/27/2018)   Hunger Vital Sign    Worried About Running Out of Food in the Last Year: Patient declined    Ran Out of Food in the Last Year: Patient declined  Transportation Needs: Unknown (04/27/2018)   PRAPARE - Administrator, Civil Service (Medical): Patient declined    Lack of Transportation (Non-Medical): Patient declined  Physical Activity: Unknown (04/27/2018)   Exercise Vital Sign    Days of Exercise per Week: Patient declined    Minutes of Exercise per Session: Patient declined  Stress: No Stress Concern Present (04/27/2018)   [employer] of Occupational Health - Occupational Stress Questionnaire    Feeling of Stress : Not at all  Social Connections: Unknown (04/27/2018)   Social Connection and Isolation Panel [NHANES]    Frequency of Communication with Friends and Family: Patient declined    Frequency of Social Gatherings with Friends and Family: Patient declined    Attends Religious Services: Patient declined    Database administrator or Organizations: Patient declined    Attends Hospital doctor: Patient declined    Marital Status: Patient declined   Family History  Problem Relation Age of Onset   Hypertension Mother    Kidney cancer Mother        dx early 60s   Heart attack Paternal Grandfather    Throat cancer Brother 36       hx smoking   Head & neck cancer Son 51       jaw cancer   Breast cancer Maternal Aunt 6   Breast cancer Paternal Aunt 83   Uterine cancer Cousin        dx early 7s (maternal first cousin)   Colon polyps Neg Hx    Colon cancer Neg Hx    Esophageal cancer Neg Hx    Rectal cancer Neg Hx    Stomach cancer Neg Hx    Allergies  Allergen Reactions   Feraheme  [Ferumoxytol ] Shortness Of Breath and Palpitations    Flushed face; palpitations, SOB   Current Outpatient Medications  Medication Sig Dispense Refill   acetaminophen  (TYLENOL ) 500 MG tablet Take 1,000 mg by mouth daily.     aspirin  EC 81 MG tablet Take 81 mg by mouth daily. Swallow whole.     Cyanocobalamin  (VITAMIN B 12 PO) Take 1 tablet by mouth 3 (three) times a week. Take 1 tablet by mouth 3 times a week. Take on Monday, Wednesday and Friday     glucose blood (TRUE METRIX BLOOD GLUCOSE TEST) test strip Use as instructed to test blood sugar once daily E08.638 100 each 2   lisinopril  (ZESTRIL ) 20 MG tablet Take 1 tablet (20 mg total) by mouth daily. 90 tablet 3   pravastatin  (PRAVACHOL ) 20 MG tablet Take 1 tablet (20 mg total) by mouth every evening. 90 tablet 1   No current facility-administered medications for this visit.   No results found.  Review of Systems:   A ROS was performed including pertinent positives and negatives as documented in the HPI.  Physical Exam :  Constitutional: NAD and appears stated age Neurological: Alert and oriented Psych: Appropriate affect and cooperative There were no vitals taken for this visit.   Comprehensive Musculoskeletal Exam:    Exam of the left knee demonstrates bilateral joint line tenderness, worse medially.  Active range of  motion is from 0 to 100 degrees with palpable crepitus.  No instability with varus or valgus stress.  Negative for erythema or warmth. Left ankle appears mildly swollen diffusely.  Some tenderness noted over the medial malleolus and medial distal tibia, but no lateral malleoli or tenderness.  Active ankle range of motion is to 20 degrees dorsiflexion 30 degrees plantarflexion.  Negative anterior drawer.  Imaging:   Xray review from 07/11/23 (left ankle 3 views): Subacute appearing cortical irregularity of the medial malleolus.  Calcification noted off of the lateral distal fibula suggestive of avulsion injury, age-indeterminate.  Xray review from 07/11/23 (left knee 4 views): Tricompartmental osteoarthritis with moderate joint space narrowing within the medial compartment.Diffuse osteophytes particularly in the patellofemoral compartment.  No acute fracture or dislocation.   I personally reviewed and interpreted the radiographs.   Assessment:   83 y.o. female with acute left ankle and knee pain.  Review of ankle x-rays today shows a chronic appearing irregularity of the medial malleolus, but she does not recall ever significantly injuring her ankle.  There is a small calcification off of the lateral malleolus which may represent an acute avulsion fracture.  Discussed that these are often treated more as an ankle sprain, therefore I have recommended an ankle brace which I believe she will tolerate better than a cam boot.  She reports today that her knee is more bothersome than the ankle, which demonstrates moderate tricompartmental osteoarthritis particular in the medial compartment.  I have offered a cortisone injection for symptomatic relief which she is agreeable to.  Her last A1c was 6.9.  Injection was performed in clinic today without any complication.  Will have her return to clinic as needed.  Plan :    - Left knee cortisone injection performed today - Return to clinic as  needed     Procedure Note  Patient: Briana Ochoa             Date of Birth: 1940-04-19           MRN: [id number]             Visit Date: 07/17/2023  Procedures: Visit Diagnoses:  1. Unilateral primary osteoarthritis, left knee   2. Pain in left ankle and joints of left foot     Large Joint Inj: L knee on 07/17/2023 5:41 PM Indications: pain Details: 22 G 1.5 in needle, anterolateral approach Medications: 4 mL lidocaine  1 %; 2 mL triamcinolone acetonide 40 MG/ML Outcome: tolerated well, no immediate complications Procedure, treatment alternatives, risks and benefits explained, specific risks discussed. Consent was given by the patient. Immediately prior to procedure a time out was called to verify the correct patient, procedure, equipment, support staff and site/side marked as required. Patient was prepped and draped in the usual sterile fashion.       I personally saw and evaluated the patient, and participated in the management and treatment plan.  Sharrell Deck, PA-C Orthopedics

## 2023-07-31 ENCOUNTER — Ambulatory Visit (INDEPENDENT_AMBULATORY_CARE_PROVIDER_SITE_OTHER): Admitting: Urology

## 2023-07-31 ENCOUNTER — Encounter: Payer: Self-pay | Admitting: Urology

## 2023-07-31 VITALS — BP 142/80 | HR 94

## 2023-07-31 DIAGNOSIS — Z8551 Personal history of malignant neoplasm of bladder: Secondary | ICD-10-CM

## 2023-07-31 DIAGNOSIS — C679 Malignant neoplasm of bladder, unspecified: Secondary | ICD-10-CM | POA: Diagnosis not present

## 2023-07-31 MED ORDER — CIPROFLOXACIN HCL 500 MG PO TABS
500.0000 mg | ORAL_TABLET | Freq: Once | ORAL | Status: AC
Start: 2023-07-31 — End: 2023-07-31
  Administered 2023-07-31: 500 mg via ORAL

## 2023-07-31 NOTE — Progress Notes (Signed)
   07/31/23  CC: followup bladder cancer   HPI: Briana Ochoa is a 83yo here for followup for high grade bladder cancer Blood pressure (!) 142/80, pulse 94. NED. A&Ox3.   No respiratory distress   Abd soft, NT, ND Normal external genitalia with patent urethral meatus  Cystoscopy Procedure Note  Patient identification was confirmed, informed consent was obtained, and patient was prepped using Betadine solution.  Lidocaine  jelly was administered per urethral meatus.    Procedure: - Flexible cystoscope introduced, without any difficulty.   - Thorough search of the bladder revealed:    normal urethral meatus    normal urothelium    no stones    no ulcers     no tumors    no urethral polyps    no trabeculation  - Ureteral orifices were normal in position and appearance.  Post-Procedure: - Patient tolerated the procedure well  Assessment/ Plan: Followup 1 year for cystoscopy   No follow-ups on file.  Briana Clara, MD

## 2023-07-31 NOTE — Patient Instructions (Signed)

## 2023-08-01 LAB — URINALYSIS, ROUTINE W REFLEX MICROSCOPIC
Bilirubin, UA: NEGATIVE
Glucose, UA: NEGATIVE
Leukocytes,UA: NEGATIVE
Nitrite, UA: NEGATIVE
Protein,UA: NEGATIVE
RBC, UA: NEGATIVE
Specific Gravity, UA: 1.02 (ref 1.005–1.030)
Urobilinogen, Ur: 0.2 mg/dL (ref 0.2–1.0)
pH, UA: 6 (ref 5.0–7.5)

## 2023-08-08 ENCOUNTER — Inpatient Hospital Stay: Attending: Hematology

## 2023-08-08 ENCOUNTER — Encounter: Payer: Self-pay | Admitting: Hematology

## 2023-08-08 ENCOUNTER — Ambulatory Visit (HOSPITAL_COMMUNITY)
Admission: RE | Admit: 2023-08-08 | Discharge: 2023-08-08 | Disposition: A | Discharge status: Home / Self Care | Source: Ambulatory Visit | Attending: Hematology | Admitting: Hematology

## 2023-08-08 DIAGNOSIS — N1831 Chronic kidney disease, stage 3a: Secondary | ICD-10-CM | POA: Insufficient documentation

## 2023-08-08 DIAGNOSIS — C186 Malignant neoplasm of descending colon: Secondary | ICD-10-CM | POA: Insufficient documentation

## 2023-08-08 DIAGNOSIS — Z923 Personal history of irradiation: Secondary | ICD-10-CM | POA: Diagnosis not present

## 2023-08-08 DIAGNOSIS — Z85038 Personal history of other malignant neoplasm of large intestine: Secondary | ICD-10-CM | POA: Diagnosis present

## 2023-08-08 DIAGNOSIS — Z8051 Family history of malignant neoplasm of kidney: Secondary | ICD-10-CM | POA: Diagnosis not present

## 2023-08-08 DIAGNOSIS — Z8049 Family history of malignant neoplasm of other genital organs: Secondary | ICD-10-CM | POA: Insufficient documentation

## 2023-08-08 DIAGNOSIS — Z800 Family history of malignant neoplasm of digestive organs: Secondary | ICD-10-CM | POA: Insufficient documentation

## 2023-08-08 DIAGNOSIS — Z803 Family history of malignant neoplasm of breast: Secondary | ICD-10-CM | POA: Diagnosis not present

## 2023-08-08 DIAGNOSIS — Z9221 Personal history of antineoplastic chemotherapy: Secondary | ICD-10-CM | POA: Insufficient documentation

## 2023-08-08 DIAGNOSIS — R918 Other nonspecific abnormal finding of lung field: Secondary | ICD-10-CM | POA: Diagnosis not present

## 2023-08-08 DIAGNOSIS — E538 Deficiency of other specified B group vitamins: Secondary | ICD-10-CM

## 2023-08-08 DIAGNOSIS — R911 Solitary pulmonary nodule: Secondary | ICD-10-CM | POA: Diagnosis not present

## 2023-08-08 LAB — CBC WITH DIFFERENTIAL (CANCER CENTER ONLY)
Abs Immature Granulocytes: 0.03 10*3/uL (ref 0.00–0.07)
Basophils Absolute: 0.1 10*3/uL (ref 0.0–0.1)
Basophils Relative: 1 %
Eosinophils Absolute: 0.4 10*3/uL (ref 0.0–0.5)
Eosinophils Relative: 4 %
HCT: 45 % (ref 36.0–46.0)
Hemoglobin: 15.2 g/dL — ABNORMAL HIGH (ref 12.0–15.0)
Immature Granulocytes: 0 %
Lymphocytes Relative: 18 %
Lymphs Abs: 1.9 10*3/uL (ref 0.7–4.0)
MCH: 31 pg (ref 26.0–34.0)
MCHC: 33.8 g/dL (ref 30.0–36.0)
MCV: 91.8 fL (ref 80.0–100.0)
Monocytes Absolute: 1 10*3/uL (ref 0.1–1.0)
Monocytes Relative: 10 %
Neutro Abs: 6.8 10*3/uL (ref 1.7–7.7)
Neutrophils Relative %: 67 %
Platelet Count: 287 10*3/uL (ref 150–400)
RBC: 4.9 MIL/uL (ref 3.87–5.11)
RDW: 13.9 % (ref 11.5–15.5)
WBC Count: 10.2 10*3/uL (ref 4.0–10.5)
nRBC: 0 % (ref 0.0–0.2)

## 2023-08-08 LAB — CMP (CANCER CENTER ONLY)
ALT: 10 U/L (ref 0–44)
AST: 12 U/L — ABNORMAL LOW (ref 15–41)
Albumin: 4 g/dL (ref 3.5–5.0)
Alkaline Phosphatase: 84 U/L (ref 38–126)
Anion gap: 7 (ref 5–15)
BUN: 27 mg/dL — ABNORMAL HIGH (ref 8–23)
CO2: 27 mmol/L (ref 22–32)
Calcium: 9.7 mg/dL (ref 8.9–10.3)
Chloride: 105 mmol/L (ref 98–111)
Creatinine: 1.42 mg/dL — ABNORMAL HIGH (ref 0.44–1.00)
GFR, Estimated: 37 mL/min — ABNORMAL LOW (ref >60)
Glucose, Bld: 102 mg/dL — ABNORMAL HIGH (ref 70–99)
Potassium: 4.9 mmol/L (ref 3.5–5.1)
Sodium: 139 mmol/L (ref 135–145)
Total Bilirubin: 0.4 mg/dL (ref 0.0–1.2)
Total Protein: 6.9 g/dL (ref 6.5–8.1)

## 2023-08-08 LAB — VITAMIN B12: Vitamin B-12: 1243 pg/mL — ABNORMAL HIGH (ref 180–914)

## 2023-08-15 ENCOUNTER — Inpatient Hospital Stay: Admitting: Hematology

## 2023-08-15 VITALS — BP 133/71 | HR 81 | Temp 97.9°F | Resp 17 | Ht 64.0 in | Wt 194.0 lb

## 2023-08-15 DIAGNOSIS — Z800 Family history of malignant neoplasm of digestive organs: Secondary | ICD-10-CM | POA: Diagnosis not present

## 2023-08-15 DIAGNOSIS — Z8051 Family history of malignant neoplasm of kidney: Secondary | ICD-10-CM | POA: Diagnosis not present

## 2023-08-15 DIAGNOSIS — C186 Malignant neoplasm of descending colon: Secondary | ICD-10-CM | POA: Diagnosis not present

## 2023-08-15 DIAGNOSIS — Z923 Personal history of irradiation: Secondary | ICD-10-CM | POA: Diagnosis not present

## 2023-08-15 DIAGNOSIS — R911 Solitary pulmonary nodule: Secondary | ICD-10-CM | POA: Diagnosis not present

## 2023-08-15 DIAGNOSIS — N1831 Chronic kidney disease, stage 3a: Secondary | ICD-10-CM | POA: Diagnosis not present

## 2023-08-15 DIAGNOSIS — Z8049 Family history of malignant neoplasm of other genital organs: Secondary | ICD-10-CM | POA: Diagnosis not present

## 2023-08-15 DIAGNOSIS — Z803 Family history of malignant neoplasm of breast: Secondary | ICD-10-CM | POA: Diagnosis not present

## 2023-08-15 DIAGNOSIS — Z9221 Personal history of antineoplastic chemotherapy: Secondary | ICD-10-CM | POA: Diagnosis not present

## 2023-08-15 NOTE — Progress Notes (Signed)
 [facility]   [phone] [phone]   Clinic Follow up Note   Patient Care Team: Nche, Roselie Rockford, NP as PCP - General (Internal Medicine) Odean Potts, MD as Consulting Physician (Hematology and Oncology) Rosemarie Eather RAMAN, MD as Consulting Physician (Neurology) Lanis Pupa, MD as Consulting Physician (Neurosurgery) Charmayne Molly, MD as Consulting Physician (Ophthalmology) Lanny Callander, MD as Consulting Physician (Hematology and Oncology) Debby Hila, MD as Consulting Physician (General Surgery)  Date of Service:  08/15/2023  CHIEF COMPLAINT: f/u of lung nodule  CURRENT THERAPY:  Cancer surveillance  Oncology History   Cancer of left colon (HCC) pT3N0M0, Stage II, MSI-High -diagnosed on 04/23/20 by Colonoscopy performed for heme-positive stool and anemia. -resection with Dr Debby on 05/28/20 showed 9.7cm mass in splenic flexure with invasive adenocarcinoma, poorly differentiated, negative margins, negative LNs.  -GuardantReveal on 07/07/20 was positive -staging CT CAP w/o contrast on 08/31/20 showed NED. -She began Xeloda  and Keytruda  on 09/21/20. Xeloda  held due to increased Cr and then low EGFR from 12/04/20 - 02/09/21, and Keytruda  was held due to taste change/mouth numbness from 12/25/20 - 02/09/21. Completed Xeloda  in 04/2021 and Keytruda  on 09/17/21. -restaging CT from 09/06/2022, which showed no evidence of cancer recurrence, I personally reviewed the images with her  -restaging CT from 04/04/2023 showed Interval increase in size of the 11 mm posterior left upper lobe pulmonary nodule with a new 4 mm right lower lobe pulmonary nodule -repeated CT chest 08/08/2023 showed increased LUL lung nodule from 1.3 to 2cm,  will obtain PET and GuardantReveal for further evaluation   Assessment & Plan Colon cancer Diagnosed in 2022 with no current evidence of recurrence. -I personally reviewed her repeated CT chest, which showed slightly enlarging left  upper lobe lung nodule.  I do not have high suspicion for metastatic colon cancer to lung, but will obtain ct DNA testing to rule out  - Schedule Guardianreveal blood test for August 22, 2023, at 2:30 PM - Follow up with results of the blood test in approximately 10 days  Lung nodule, unspecified Slightly larger nodule in the left lung on recent CT chest from August 08, 2023. Present for several years with very slow changes. Previous PET scan in 2023 and lung biopsy in February 2023 were negative. No respiratory symptoms reported. Potential for cure with surgery or radiation if cancerous. - Consult with Dr. Ruther regarding the lung nodule - Consider repeating biopsy if recommended by Dr. Ruther - Consider whole body PET scan if recommended by Dr. Ruther - Follow up with Dr. Virgilio recommendations within the next 1-2 weeks  Chronic kidney disease,stage IIIA Well-managed with no significant changes. - Encourage increased water intake  Plan - She is clinically doing very well, asymptomatic. - I personally reviewed her repeated a CT chest, which showed slowly growing of left upper lobe lung nodule since 2022.  This was previously biopsied in 2023.  I will message pulmonologist Dr. Shelah to see if she needs a repeated biopsy. - Lab and follow-up in 6 months. -lab next week for GuardantReveal    SUMMARY OF ONCOLOGIC HISTORY: Oncology History Overview Note  Cancer Staging Cancer of left colon [facility]) Staging form: Colon and Rectum, AJCC 8th Edition - Pathologic stage from 05/28/2020: Stage IIA (pT3, pN0, cM0) - Signed by Lanny Callander, MD on 06/19/2020 Stage prefix: Initial diagnosis Total positive nodes: 0 Histologic grading system: 4 grade system Histologic grade (G): G3 Residual tumor (R): R0 - None  Ductal carcinoma in situ (  DCIS) of left breast Staging form: Breast, AJCC 7th Edition - Clinical: Stage 0 (Tis (DCIS), N0, cM0) - Unsigned Specimen type: Core Needle Biopsy Histopathologic type:  9932 Laterality: Left Staging comments: Staged at breast conference 7.22.15  - Pathologic: Stage 0 (Tis (DCIS)(2), N0, cM0) - Unsigned Specimen type: Core Needle Biopsy Histopathologic type: 9932 Laterality: Left Tumor size (mm): 3 Multiple tumors: Yes Number of tumors: 2 Method of lymph node assessment: Clinical Method of detection of distant metastases: Clinical Residual tumor (R): R0 - None Estrogen receptor status: Positive Progesterone receptor status: Positive    Ductal carcinoma in situ (DCIS) of left breast  08/20/2013 Initial Diagnosis   Left breast 12:00: DCIS with calcifications, yet 100%, PR 100%; 09/04/2013 second group of calcifications also biopsied proven to be DCIS ER/PR positive   09/19/2013 Surgery   Left breast lumpectomy DCIS 2 foci margins negative, 0.2 and 0.3 cm ER 100% PR 100%   10/08/2013 - 11/12/2013 Radiation Therapy   Adjuvant radiation therapy   11/12/2013 - 2020 Anti-estrogen oral therapy   Anastrozole  1 mg daily plan is for 5 years   01/09/2014 - 01/10/2014 Hospital Admission   Pipeline embolization 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 The report date is 07/30/2020.   The CancerNext-Expanded + RNAinsight gene panel offered by [employer] and includes sequencing and rearrangement analysis for the following 77 genes: AIP, ALK, APC, ATM, AXIN2, BAP1, BARD1, BLM, BMPR1A, BRCA1, BRCA2, BRIP1, CDC73, CDH1, CDK4, CDKN1B, CDKN2A, CHEK2, CTNNA1, DICER1, FANCC, FH, FLCN, GALNT12, KIF1B, LZTR1, MAX, MEN1, MET, MLH1, MSH2, MSH3, MSH6, MUTYH, NBN, NF1, NF2, NTHL1, PALB2, PHOX2B, PMS2, POT1, PRKAR1A, PTCH1, PTEN, RAD51C, RAD51D, RB1, RECQL, RET, SDHA, SDHAF2, SDHB, SDHC, SDHD, SMAD4, SMARCA4, SMARCB1, SMARCE1, STK11, SUFU, TMEM127, TP53, TSC1, TSC2, VHL and XRCC2 (sequencing and deletion/duplication); EGFR, EGLN1, HOXB13, KIT, MITF,  PDGFRA, POLD1 and POLE (sequencing only); EPCAM and GREM1 (deletion/duplication only). RNA data is routinely analyzed for use in variant interpretation for all genes.   Bladder cancer (HCC)  02/09/2018 Initial Diagnosis   Bladder cancer (HCC)   07/30/2020 Genetic Testing   Negative genetic testing:  No pathogenic variants detected on the [employer] CancerNext-Expanded + RNAinsight panel. The report date is 07/30/2020.   The CancerNext-Expanded + RNAinsight gene panel offered by [employer] and includes sequencing and rearrangement analysis for the following 77 genes: AIP, ALK, APC, ATM, AXIN2, BAP1, BARD1, BLM, BMPR1A, BRCA1, BRCA2, BRIP1, CDC73, CDH1, CDK4, CDKN1B, CDKN2A, CHEK2, CTNNA1, DICER1, FANCC, FH, FLCN, GALNT12, KIF1B, LZTR1, MAX, MEN1, MET, MLH1, MSH2, MSH3, MSH6, MUTYH, NBN, NF1, NF2, NTHL1, PALB2, PHOX2B, PMS2, POT1, PRKAR1A, PTCH1, PTEN, RAD51C, RAD51D, RB1, RECQL, RET, SDHA, SDHAF2, SDHB, SDHC, SDHD, SMAD4, SMARCA4, SMARCB1, SMARCE1, STK11, SUFU, TMEM127, TP53, TSC1, TSC2, VHL and XRCC2 (sequencing and deletion/duplication); EGFR, EGLN1, HOXB13, KIT, MITF, PDGFRA, POLD1 and POLE (sequencing only); EPCAM and GREM1 (deletion/duplication only). RNA data is routinely analyzed for use in variant interpretation for all genes.   08/31/2020 Imaging   CT CAP w/o contrast  IMPRESSION: 1. No specific findings of active malignancy on today's noncontrast CT. There has been interval partial colectomy involving the tumor site with reanastomosis. Some faint stranding in the adjacent omentum is probably from scarring or fat necrosis, less likely due to early tumor given the lack of overt nodularity. Surveillance imaging is likely indicated, and PET-CT may have a role in imaging follow up. 2. Several small  pulmonary nodules, in addition to a larger mixed density nodule in the left upper lobe. Surveillance is recommended. 3. Other imaging findings of potential clinical significance:  Aortic Atherosclerosis (ICD10-I70.0). Coronary atherosclerosis. Mitral valve calcification. Small type 1 hiatal hernia. Splenectomy with small amount of regenerative splenic tissue as well as a venous varix arising from the splenic vein. Hyperdense left kidney lower pole exophytic homogeneous lesion, probably a complex cyst. Sigmoid colon diverticulosis. Multilevel lumbar spondylosis and degenerative disc disease.   Cancer of left colon (HCC)  04/23/2020 Procedure   Upper Endoscopy and Colonoscopy by Dr Avram IMPRESSION Erythematous mucosa in the antrum. Biopsied. - Small hiatal hernia. - Gastroesophageal flap valve classified as Hill Grade IV (no fold, wide open lumen, hiatal hernia present). - The examination was otherwise normal.   IMPRESSION - Decreased sphincter tone found on digital rectal exam. - Malignant partially obstructing tumor in the proximal descending colon. Biopsied. Tattooed. - Three 5 to 20 mm polyps in the sigmoid colon, removed piecemeal using a cold snare. Resected and retrieved. - Severe diverticulosis in the sigmoid colon. - I left several diminutive descending (proximal to tattoo), sigmoid and rectal polyps - The examination was otherwise normal on direct and retroflexion views.     04/23/2020 Initial Diagnosis   Diagnosis 1. Descending Colon Polyp, mass - ADENOCARCINOMA. 2. Sigmoid Colon Polyp, (3) - MULTIPLE FRAGMENTS OF TUBULAR ADENOMA. - MULTIPLE FRAGMENTS OF SESSILE SERRATED POLYP WITHOUT DYSPLASIA. - NO HIGH GRADE DYSPLASIA OR MALIGNANCY. Microscopic Comment 1. Dr. Rebbecca has reviewed the case. Dr. Avram was notified on 04/24/2020. 2. Despite being left sided the fragments have features of a sessile serrated polyp, as opposed to, hyperplastic polyp.   04/27/2020 Imaging   CT AP IMPRESSION: Bulky annular soft tissue mass involving the distal transverse colon with extension into adjacent pericolonic fat, consistent with primary colon  carcinoma.   No evidence of metastatic disease.   Colonic diverticulosis. No radiographic evidence of diverticulitis.   Aortic Atherosclerosis (ICD10-I70.0).     05/05/2020 Imaging   CT Chest IMPRESSION: Bulky annular soft tissue mass involving the distal transverse colon with extension into adjacent pericolonic fat, consistent with primary colon carcinoma.   No evidence of metastatic disease.   Colonic diverticulosis. No radiographic evidence of diverticulitis.   Aortic Atherosclerosis (ICD10-I70.0).     05/28/2020 Initial Diagnosis   Cancer of left colon (HCC)   05/28/2020 Cancer Staging   Staging form: Colon and Rectum, AJCC 8th Edition - Pathologic stage from 05/28/2020: Stage IIA (pT3, pN0, cM0) - Signed by Lanny Callander, MD on 06/19/2020 Stage prefix: Initial diagnosis Total positive nodes: 0 Histologic grading system: 4 grade system Histologic grade (G): G3 Residual tumor (R): R0 - None   05/28/2020 Surgery   XI ROBOT ASSISTED RESECTION OF SPLENIC FLEXURE by Dr Debby    FINAL MICROSCOPIC DIAGNOSIS:   A. COLON, SPLENIC FLEXURE, RESECTION:  - Invasive adenocarcinoma, poorly differentiated, spanning 9.7 cm.  - Tumor invades through muscularis propria into pericolonic tissue.  - Resection margins are negative.  - Tubular adenoma (X1).  - Sessile serrate polyp without dysplasia (x2).  - Diverticulosis.  - Polypectomy scar.  - Twenty-four of twenty-four lymph nodes negative for carcinoma (0/24).  - See oncology table.    07/07/2020 Miscellaneous   Guardant Reveal ctDNA positive MSI high   07/30/2020 Genetic Testing   Negative genetic testing:  No pathogenic variants detected on the [employer] CancerNext-Expanded + RNAinsight panel. The report date is 07/30/2020.   The CancerNext-Expanded + RNAinsight gene  panel offered by [facility] and includes sequencing and rearrangement analysis for the following 77 genes: AIP, ALK, APC, ATM, AXIN2, BAP1, BARD1, BLM, BMPR1A, BRCA1,  BRCA2, BRIP1, CDC73, CDH1, CDK4, CDKN1B, CDKN2A, CHEK2, CTNNA1, DICER1, FANCC, FH, FLCN, GALNT12, KIF1B, LZTR1, MAX, MEN1, MET, MLH1, MSH2, MSH3, MSH6, MUTYH, NBN, NF1, NF2, NTHL1, PALB2, PHOX2B, PMS2, POT1, PRKAR1A, PTCH1, PTEN, RAD51C, RAD51D, RB1, RECQL, RET, SDHA, SDHAF2, SDHB, SDHC, SDHD, SMAD4, SMARCA4, SMARCB1, SMARCE1, STK11, SUFU, TMEM127, TP53, TSC1, TSC2, VHL and XRCC2 (sequencing and deletion/duplication); EGFR, EGLN1, HOXB13, KIT, MITF, PDGFRA, POLD1 and POLE (sequencing only); EPCAM and GREM1 (deletion/duplication only). RNA data is routinely analyzed for use in variant interpretation for all genes.   08/31/2020 Imaging   CT CAP w/o contrast  IMPRESSION: 1. No specific findings of active malignancy on today's noncontrast CT. There has been interval partial colectomy involving the tumor site with reanastomosis. Some faint stranding in the adjacent omentum is probably from scarring or fat necrosis, less likely due to early tumor given the lack of overt nodularity. Surveillance imaging is likely indicated, and PET-CT may have a role in imaging follow up. 2. Several small pulmonary nodules, in addition to a larger mixed density nodule in the left upper lobe. Surveillance is recommended. 3. Other imaging findings of potential clinical significance: Aortic Atherosclerosis (ICD10-I70.0). Coronary atherosclerosis. Mitral valve calcification. Small type 1 hiatal hernia. Splenectomy with small amount of regenerative splenic tissue as well as a venous varix arising from the splenic vein. Hyperdense left kidney lower pole exophytic homogeneous lesion, probably a complex cyst. Sigmoid colon diverticulosis. Multilevel lumbar spondylosis and degenerative disc disease.   10/02/2020 - 09/17/2021 Chemotherapy   Patient is on Treatment Plan : COLORECTAL Pembrolizumab  q21d     03/01/2021 Imaging   EXAM: CT CHEST, ABDOMEN AND PELVIS WITHOUT CONTRAST  IMPRESSION: 1. Mixed attenuation lesion  posterior left upper lobe has areas of more confluent and enlarging nodularity today. Interval progression raises concern for neoplasm. Consider PET-CT to further evaluate. 2. Stable 7 mm right lower lobe pulmonary nodule with a slightly more conspicuous 3 mm nodule in the right lower lobe. Continued close attention on follow-up recommended. 3. No evidence for metastatic disease in the abdomen or pelvis. 4. Aortic Atherosclerosis (ICD10-I70.0).   03/17/2021 PET scan   IMPRESSION: 1. Part solid nodule of the left upper lobe demonstrates mild FDG uptake, and has increased in size when compared with prior exam dated May 05, 2020. Concerning for indolent primary lung neoplasm. 2. No evidence of metastatic disease in the chest, abdomen or pelvis. 3. 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 Recommend further evaluation with tissue sampling. 4. Aortic Atherosclerosis (ICD10-I70.0).   03/15/2022 Imaging    IMPRESSION: Stable irregular solid pulmonary nodule in posterior left upper lobe.   No evidence of recurrent or metastatic disease within the abdomen or pelvis.   Colonic diverticulosis, without radiographic evidence of diverticulitis.      Discussed the use of AI scribe software for clinical note transcription with the patient, who gave verbal consent to proceed.  History of Present Illness Briana Ochoa is an 83 year old female with colon cancer who presents for follow-up.  She feels well with no new symptoms and has stable weight. Her energy level is lower than desired but she remains active, including cutting grass. A CT scan on August 08, 2023, showed a slightly larger nodule in the left lung, with no cough or chest symptoms. Previous imaging and a lung biopsy  were negative. She has a history of kidney disease and takes B12 supplements.     All other systems were reviewed with the patient and are negative.  MEDICAL  HISTORY:  Past Medical History:  Diagnosis Date   Allergy    seasonal allergies   Anemia    on meds   Aortic atherosclerosis (HCC)    Bladder cancer (HCC) 2020   Bladder tumor    Bloody diarrhea 09/29/2017   Breast cancer (HCC) 2015   Left Breast Cancer   Cataract    bilateral -sx   CKD (chronic kidney disease), stage III (HCC)    DM related   Colon cancer (HCC)    Diabetes mellitus type 2, diet-controlled (HCC)    diet controlled   Dyspnea 09/28/2017   Dysuria    Family history of adverse reaction to anesthesia    aunt- N/V    Family history of breast cancer    Family history of kidney cancer    Family history of throat cancer    Family history of uterine cancer    Fatigue 09/28/2017   Frequency of urination    Grade I diastolic dysfunction 08/06/2014   Noted on ECHO   Gross hematuria 09/29/2017   Heart murmur    since rheumatic fever as child   Hematuria    History of cardiomegaly 02/12/2005   Mild, noted on CXR   History of CVA (cerebrovascular accident) 08/05/2014   post op cerebral angiogram, cerebral thrombosis w/ cerebral infarction;  11-02-2017  per pt no residuals   History of malignant melanoma of skin 12/2015   s/p wide local excision right lower leg (per pt localized)   History of rheumatic fever as a child    Hyperlipidemia    on meds   Hypertension    on meds   Intracranial aneurysm dx 07/ 2015   paraophthalmic RICA aneurysm /   s/p  Pipeline embolization right ICA 01-09-2014   Jaundice 09/28/2017   Malignant neoplasm of central portion of left breast in female, estrogen receptor positive [facility]) oncologist-  dr odean   dx 07/ 2015--- DCIS, ER/PR positive-- s/p  left breast lumpectomy 09-19-2013,  completed radiation 11-12-2013,  started arimidex  11-12-2013   Melanoma (HCC)    righ tleg    OA (osteoarthritis)    knees, hands, R shoulder   Osteopenia 10/16/2012   Diffuse   Personal history of radiation therapy    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   Sigmoid diverticulosis    Weakness 09/28/2017   Wears dentures    upper   Wears glasses    Wears hearing aid in both ears     SURGICAL HISTORY: Past Surgical History:  Procedure Laterality Date   BREAST LUMPECTOMY Left 2015   BREAST LUMPECTOMY WITH NEEDLE LOCALIZATION Left 09/19/2013   Procedure: LEFT BREAST LUMPECTOMY WITH DOUBLE WIRE BRACKETED  NEEDLE LOCALIZATION;  Surgeon: Elon CHRISTELLA Pacini, MD;  Location: [facility] OR;  Service: General;  Laterality: Left;   BRONCHIAL BIOPSY  04/06/2021   Procedure: BRONCHIAL BIOPSIES;  Surgeon: Brenna Adine CROME, DO;  Location: [facility]OS[facility];  Service: Pulmonary;;   BRONCHIAL BRUSHINGS  04/06/2021   Procedure: BRONCHIAL BRUSHINGS;  Surgeon: Brenna Adine CROME, DO;  Location: [facility];  Service: Pulmonary;;   CATARACT EXTRACTION W/ INTRAOCULAR LENS  IMPLANT, BILATERAL  06/2015   CESAREAN SECTION  1964   CHOLECYSTECTOMY OPEN  1970s   CYSTOSCOPY W/ URETERAL STENT PLACEMENT Left 11/07/2017   Procedure:  CYSTOSCOPY WITH RETROGRADE PYELOGRAM/URETERAL STENT PLACEMENT;  Surgeon: Sherrilee Belvie CROME, MD;  Location: [facility];  Service: Urology;  Laterality: Left;   CYSTOSCOPY W/ URETERAL STENT PLACEMENT Bilateral 04/27/2018   Procedure: CYSTOSCOPY WITH RETROGRADE PYELOGRAM;  Surgeon: Sherrilee Belvie CROME, MD;  Location: [facility];  Service: Urology;  Laterality: Bilateral;   FINGER SURGERY     lt thumb  from dog bite   IR  NEPHROURETERAL CATH PLACE LEFT  04/28/2018   MELANOMA EXCISION Right 12/28/2015   Procedure: EXCISION MELANOMA RIGHT LOWER LEG;  Surgeon: Elon Pacini, MD;  Location: [facility];  Service: General;  Laterality: Right;   RADIOLOGY WITH ANESTHESIA N/A 01/09/2014   Procedure: Embolization;  Surgeon: Gerldine Maizes, MD;  Location: [facility] OR;  Service: Radiology;  Laterality: N/A;   SPLENECTOMY, TOTAL  2009 approx.   splen injury due to fall off horse   TONSILLECTOMY  child    TRANSURETHRAL RESECTION OF BLADDER TUMOR N/A 11/07/2017   Procedure: TRANSURETHRAL RESECTION OF BLADDER TUMOR (TURBT), POSSIBLE STENT PLACEMENT;  Surgeon: Sherrilee Belvie CROME, MD;  Location: [facility];  Service: Urology;  Laterality: N/A;   TRANSURETHRAL RESECTION OF BLADDER TUMOR N/A 12/14/2017   Procedure: TRANSURETHRAL RESECTION OF BLADDER TUMOR (TURBT);  Surgeon: Sherrilee Belvie CROME, MD;  Location: [facility];  Service: Urology;  Laterality: N/A;  30 MINS   TRANSURETHRAL RESECTION OF BLADDER TUMOR N/A 04/27/2018   Procedure: TRANSURETHRAL RESECTION OF BLADDER TUMOR (TURBT);  Surgeon: Sherrilee Belvie CROME, MD;  Location: [facility];  Service: Urology;  Laterality: N/A;  30 MINS   VIDEO BRONCHOSCOPY WITH RADIAL ENDOBRONCHIAL ULTRASOUND  04/06/2021   Procedure: RADIAL ENDOBRONCHIAL ULTRASOUND;  Surgeon: Brenna Adine CROME, DO;  Location: [facility];  Service: Pulmonary;;    I have reviewed the social history and family history with the patient and they are unchanged from previous note.  ALLERGIES:  is allergic to feraheme  [ferumoxytol ].  MEDICATIONS:  Current Outpatient Medications  Medication Sig Dispense Refill   acetaminophen  (TYLENOL ) 500 MG tablet Take 1,000 mg by mouth daily.     aspirin  EC 81 MG tablet Take 81 mg by mouth daily. Swallow whole.     Cyanocobalamin  (VITAMIN B 12 PO) Take 1 tablet by mouth 3 (three) times a week. Take 1 tablet by mouth 3 times a week. Take on Monday, Wednesday and Friday     glucose blood (TRUE METRIX BLOOD GLUCOSE TEST) test strip Use as instructed to test blood sugar once daily E08.638 100 each 2   lisinopril  (ZESTRIL ) 20 MG tablet Take 1 tablet (20 mg total) by mouth daily. 90 tablet 3   pravastatin  (PRAVACHOL ) 20 MG tablet Take 1 tablet (20 mg total) by mouth every evening. 90 tablet 1   No current facility-administered medications for this visit.    PHYSICAL EXAMINATION: ECOG PERFORMANCE STATUS: 0 -  Asymptomatic  Vitals:   08/15/23 1430  BP: 133/71  Pulse: 81  Resp: 17  Temp: 97.9 F (36.6 C)  SpO2: 97%   Wt Readings from Last 3 Encounters:  08/15/23 194 lb (88 kg)  07/11/23 196 lb 3.2 oz (89 kg)  04/12/23 193 lb 14.4 oz (88 kg)     GENERAL:alert, no distress and comfortable SKIN: skin color, texture, turgor are normal, no rashes or significant lesions EYES: normal, Conjunctiva are pink and non-injected, sclera clear NECK: supple, thyroid  normal size, non-tender, without nodularity LYMPH:  no palpable lymphadenopathy in the cervical, axillary  LUNGS: clear to auscultation and percussion with normal breathing effort HEART: regular rate & rhythm and no murmurs and no lower extremity edema ABDOMEN:abdomen soft, non-tender and normal bowel sounds Musculoskeletal:no cyanosis of digits and no clubbing  NEURO: alert & oriented x 3 with fluent speech, no focal motor/sensory deficits  Physical Exam    LABORATORY DATA:  I have reviewed the data as listed    Latest Ref Rng & Units 08/08/2023   12:02 PM 04/04/2023    3:24 PM 12/21/2022   11:20 AM  CBC  WBC 4.0 - 10.5 K/uL 10.2  9.9  10.0   Hemoglobin 12.0 - 15.0 g/dL 84.7  84.5  84.7   Hematocrit 36.0 - 46.0 % 45.0  47.0  46.7   Platelets 150 - 400 K/uL 287  307  314         Latest Ref Rng & Units 08/08/2023   12:02 PM 07/11/2023    2:51 PM 04/04/2023    3:24 PM  CMP  Glucose 70 - 99 mg/dL 897  97  91   BUN 8 - 23 mg/dL 27  26  21    Creatinine 0.44 - 1.00 mg/dL 8.57  8.55  8.54   Sodium 135 - 145 mmol/L 139  140  140   Potassium 3.5 - 5.1 mmol/L 4.9  4.7  4.8   Chloride 98 - 111 mmol/L 105  105  103   CO2 22 - 32 mmol/L 27  27  30    Calcium  8.9 - 10.3 mg/dL 9.7  9.6  9.9   Total Protein 6.5 - 8.1 g/dL 6.9   7.2   Total Bilirubin 0.0 - 1.2 mg/dL 0.4   0.4   Alkaline Phos 38 - 126 U/L 84   76   AST 15 - 41 U/L 12   14   ALT 0 - 44 U/L 10   8       RADIOGRAPHIC STUDIES: I have personally reviewed the radiological  images as listed and agreed with the findings in the report. No results found.    No orders of the defined types were placed in this encounter.  All questions were answered. The patient knows to call the clinic with any problems, questions or concerns. No barriers to learning was detected. The total time spent in the appointment was 30 minutes, including review of chart and various tests results, discussions about plan of care and coordination of care plan     Onita Mattock, MD 08/15/2023

## 2023-08-15 NOTE — Assessment & Plan Note (Signed)
 [id number], Stage II, MSI-High -diagnosed on 04/23/20 by Colonoscopy performed for heme-positive stool and anemia. -resection with Dr Debby on 05/28/20 showed 9.7cm mass in splenic flexure with invasive adenocarcinoma, poorly differentiated, negative margins, negative LNs.  -GuardantReveal on 07/07/20 was positive -staging CT CAP w/o contrast on 08/31/20 showed NED. -She began Xeloda  and Keytruda  on 09/21/20. Xeloda  held due to increased Cr and then low EGFR from 12/04/20 - 02/09/21, and Keytruda  was held due to taste change/mouth numbness from 12/25/20 - 02/09/21. Completed Xeloda  in 04/2021 and Keytruda  on 09/17/21. -restaging CT from 09/06/2022, which showed no evidence of cancer recurrence, I personally reviewed the images with her  -restaging CT from 04/04/2023 showed Interval increase in size of the 11 mm posterior left upper lobe pulmonary nodule with a new 4 mm right lower lobe pulmonary nodule -repeated CT chest 08/08/2023 showed increased LUL lung nodule from 1.3 to 2cm,  will obtain PET and GuardantReveal for further evaluation

## 2023-08-16 ENCOUNTER — Other Ambulatory Visit: Payer: Self-pay

## 2023-08-16 DIAGNOSIS — C186 Malignant neoplasm of descending colon: Secondary | ICD-10-CM

## 2023-08-16 NOTE — Progress Notes (Signed)
 As per Dr. Lanny, placed order for Guardant Reveal in portal  to be drawn on 07/15. Received confirmation of receipt and attached all needed paperwork with order. Order form and kit placed in lab to be drawn 07/15.

## 2023-08-22 ENCOUNTER — Other Ambulatory Visit: Payer: Self-pay

## 2023-08-22 ENCOUNTER — Inpatient Hospital Stay

## 2023-08-22 DIAGNOSIS — C186 Malignant neoplasm of descending colon: Secondary | ICD-10-CM | POA: Diagnosis not present

## 2023-08-22 DIAGNOSIS — R911 Solitary pulmonary nodule: Secondary | ICD-10-CM | POA: Diagnosis not present

## 2023-08-22 DIAGNOSIS — N1831 Chronic kidney disease, stage 3a: Secondary | ICD-10-CM | POA: Diagnosis not present

## 2023-08-22 DIAGNOSIS — Z800 Family history of malignant neoplasm of digestive organs: Secondary | ICD-10-CM | POA: Diagnosis not present

## 2023-08-22 DIAGNOSIS — E538 Deficiency of other specified B group vitamins: Secondary | ICD-10-CM

## 2023-08-22 DIAGNOSIS — Z923 Personal history of irradiation: Secondary | ICD-10-CM | POA: Diagnosis not present

## 2023-08-22 DIAGNOSIS — Z8051 Family history of malignant neoplasm of kidney: Secondary | ICD-10-CM | POA: Diagnosis not present

## 2023-08-22 DIAGNOSIS — Z803 Family history of malignant neoplasm of breast: Secondary | ICD-10-CM | POA: Diagnosis not present

## 2023-08-22 DIAGNOSIS — Z8049 Family history of malignant neoplasm of other genital organs: Secondary | ICD-10-CM | POA: Diagnosis not present

## 2023-08-22 DIAGNOSIS — Z9221 Personal history of antineoplastic chemotherapy: Secondary | ICD-10-CM | POA: Diagnosis not present

## 2023-08-22 LAB — CMP (CANCER CENTER ONLY)
ALT: 8 U/L (ref 0–44)
AST: 13 U/L — ABNORMAL LOW (ref 15–41)
Albumin: 3.8 g/dL (ref 3.5–5.0)
Alkaline Phosphatase: 89 U/L (ref 38–126)
Anion gap: 7 (ref 5–15)
BUN: 28 mg/dL — ABNORMAL HIGH (ref 8–23)
CO2: 26 mmol/L (ref 22–32)
Calcium: 9.5 mg/dL (ref 8.9–10.3)
Chloride: 107 mmol/L (ref 98–111)
Creatinine: 1.59 mg/dL — ABNORMAL HIGH (ref 0.44–1.00)
GFR, Estimated: 32 mL/min — ABNORMAL LOW (ref >60)
Glucose, Bld: 135 mg/dL — ABNORMAL HIGH (ref 70–99)
Potassium: 4.6 mmol/L (ref 3.5–5.1)
Sodium: 140 mmol/L (ref 135–145)
Total Bilirubin: 0.3 mg/dL (ref 0.0–1.2)
Total Protein: 6.8 g/dL (ref 6.5–8.1)

## 2023-08-22 LAB — CBC WITH DIFFERENTIAL (CANCER CENTER ONLY)
Abs Immature Granulocytes: 0.04 K/uL (ref 0.00–0.07)
Basophils Absolute: 0.1 K/uL (ref 0.0–0.1)
Basophils Relative: 1 %
Eosinophils Absolute: 0.5 K/uL (ref 0.0–0.5)
Eosinophils Relative: 5 %
HCT: 44 % (ref 36.0–46.0)
Hemoglobin: 14.5 g/dL (ref 12.0–15.0)
Immature Granulocytes: 0 %
Lymphocytes Relative: 20 %
Lymphs Abs: 1.9 K/uL (ref 0.7–4.0)
MCH: 30.9 pg (ref 26.0–34.0)
MCHC: 33 g/dL (ref 30.0–36.0)
MCV: 93.6 fL (ref 80.0–100.0)
Monocytes Absolute: 1 K/uL (ref 0.1–1.0)
Monocytes Relative: 10 %
Neutro Abs: 6.2 K/uL (ref 1.7–7.7)
Neutrophils Relative %: 64 %
Platelet Count: 270 K/uL (ref 150–400)
RBC: 4.7 MIL/uL (ref 3.87–5.11)
RDW: 13.8 % (ref 11.5–15.5)
WBC Count: 9.8 K/uL (ref 4.0–10.5)
nRBC: 0 % (ref 0.0–0.2)

## 2023-08-22 LAB — VITAMIN B12: Vitamin B-12: 745 pg/mL (ref 180–914)

## 2023-08-25 ENCOUNTER — Other Ambulatory Visit: Payer: Self-pay

## 2023-08-25 ENCOUNTER — Telehealth: Payer: Self-pay

## 2023-08-25 NOTE — Telephone Encounter (Signed)
 Pt called stating she did not receive a call from pulmonology regarding Dr. Demetra referral.  Pt stated if the pt did not get a call to notify Dr. Demetra office; thus, why this pt is calling today.  Stated Dr. Lanny is out of the office and this nurse will notify her of the pt's call.  Pt verbalized understanding and had no further questions or concerns.

## 2023-08-28 ENCOUNTER — Telehealth: Payer: Self-pay | Admitting: Emergency Medicine

## 2023-08-28 NOTE — Telephone Encounter (Signed)
 Need to get her in to be seen by RB, SG or any APP in a nodule slot. Has an enlarging LUL nodule that was bx-negative in 2023. Thank you

## 2023-08-30 NOTE — Telephone Encounter (Signed)
 Called and spoke with the patient regarding RB note. Pt has been scheduled 7/31 with KC at [facility]. Nfn

## 2023-08-31 DIAGNOSIS — C186 Malignant neoplasm of descending colon: Secondary | ICD-10-CM | POA: Diagnosis not present

## 2023-09-01 ENCOUNTER — Ambulatory Visit: Admitting: Nurse Practitioner

## 2023-09-05 ENCOUNTER — Other Ambulatory Visit: Payer: Self-pay

## 2023-09-05 ENCOUNTER — Encounter: Payer: Self-pay | Admitting: Hematology

## 2023-09-05 ENCOUNTER — Telehealth: Payer: Self-pay

## 2023-09-05 NOTE — Telephone Encounter (Signed)
 Contacted patient via telephone call to let her know the Guardant Reveal Test Result: NEGATIVE. Patient verbalized understanding.

## 2023-09-06 LAB — GUARDANT REVEAL

## 2023-09-07 ENCOUNTER — Encounter: Payer: Self-pay | Admitting: Emergency Medicine

## 2023-09-07 ENCOUNTER — Ambulatory Visit (HOSPITAL_BASED_OUTPATIENT_CLINIC_OR_DEPARTMENT_OTHER): Admitting: Nurse Practitioner

## 2023-09-07 ENCOUNTER — Ambulatory Visit: Admitting: Emergency Medicine

## 2023-09-07 ENCOUNTER — Encounter (HOSPITAL_BASED_OUTPATIENT_CLINIC_OR_DEPARTMENT_OTHER): Payer: Self-pay | Admitting: Nurse Practitioner

## 2023-09-07 VITALS — BP 125/82 | HR 79 | Temp 97.6°F | Ht 64.0 in | Wt 173.0 lb

## 2023-09-07 DIAGNOSIS — R918 Other nonspecific abnormal finding of lung field: Secondary | ICD-10-CM

## 2023-09-07 NOTE — Progress Notes (Unsigned)
 Subjective:    Patient ID: Briana Ochoa, female    DOB: 03-05-1940, 83 y.o.   MRN: [id number]  HPI 83 year old woman with a history of former tobacco use (60 pack years), history of colon cancer, left breast cancer, bladder cancer, melanoma, pulmonary nodular disease with a navigational bronchoscopy and biopsies that were benign in 2023.  PMH also significant for diabetes, CKD stage III, hypertension.  She is followed by Dr. Lanny in oncology.  She is feeling well. No dyspnea, no cough.   CT scan of the chest 08/08/2023 reviewed by me showed no significant lymphadenopathy.  There are some calcified mediastinal nodes.  There is a spiculated 1.3 x 2 cm left upper lobe pulmonary nodule extending to the pleural surface that has enlarged compared with prior studies.  There is a stable 4 mm left upper lobe nodule, stable 7 mm posterior right lower lobe nodule and several other stable.   Review of Systems As per HPI  Past Medical History:  Diagnosis Date   Allergy    seasonal allergies   Anemia    on meds   Aortic atherosclerosis (HCC)    Bladder cancer (HCC) 2020   Bladder tumor    Bloody diarrhea 09/29/2017   Breast cancer (HCC) 2015   Left Breast Cancer   Cataract    bilateral -sx   CKD (chronic kidney disease), stage III (HCC)    DM related   Colon cancer (HCC)    Diabetes mellitus type 2, diet-controlled (HCC)    diet controlled   Dyspnea 09/28/2017   Dysuria    Family history of adverse reaction to anesthesia    aunt- N/V    Family history of breast cancer    Family history of kidney cancer    Family history of throat cancer    Family history of uterine cancer    Fatigue 09/28/2017   Frequency of urination    Grade I diastolic dysfunction 08/06/2014   Noted on ECHO   Gross hematuria 09/29/2017   Heart murmur    since rheumatic fever as child   Hematuria    History of cardiomegaly 02/12/2005   Mild, noted on CXR   History of CVA (cerebrovascular accident)  08/05/2014   post op cerebral angiogram, cerebral thrombosis w/ cerebral infarction;  11-02-2017  per pt no residuals   History of malignant melanoma of skin 12/2015   s/p wide local excision right lower leg (per pt localized)   History of rheumatic fever as a child    Hyperlipidemia    on meds   Hypertension    on meds   Intracranial aneurysm dx 07/ 2015   paraophthalmic RICA aneurysm /   s/p  Pipeline embolization right ICA 01-09-2014   Jaundice 09/28/2017   Malignant neoplasm of central portion of left breast in female, estrogen receptor positive [facility]) oncologist-  dr odean   dx 07/ 2015--- DCIS, ER/PR positive-- s/p  left breast lumpectomy 09-19-2013,  completed radiation 11-12-2013,  started arimidex  11-12-2013   Melanoma (HCC)    righ tleg    OA (osteoarthritis)    knees, hands, R shoulder   Osteopenia 10/16/2012   Diffuse   Personal history of radiation therapy    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   Sigmoid diverticulosis    Weakness 09/28/2017   Wears dentures    upper   Wears glasses    Wears hearing aid in both ears      Family  History  Problem Relation Age of Onset   Hypertension Mother    Kidney cancer Mother        dx early 48s   Heart attack Paternal Grandfather    Throat cancer Brother 67       hx smoking   Head & neck cancer Son 34       jaw cancer   Breast cancer Maternal Aunt 49   Breast cancer Paternal Aunt 57   Uterine cancer Cousin        dx early 28s (maternal first cousin)   Colon polyps Neg Hx    Colon cancer Neg Hx    Esophageal cancer Neg Hx    Rectal cancer Neg Hx    Stomach cancer Neg Hx      Social History   Socioeconomic History   Marital status: Widowed    Spouse name: Not on file   Number of children: 4   Years of education: Not on file   Highest education level: Not on file  Occupational History    Comment: Retired  Tobacco Use   Smoking status: Former    Current packs/day: 0.00    Average packs/day: 1.5  packs/day for 40.0 years (60.0 ttl pk-yrs)    Types: Cigarettes    Start date: 03/06/1967    Quit date: 03/06/2007    Years since quitting: 16.5   Smokeless tobacco: Never  Vaping Use   Vaping status: Never Used  Substance and Sexual Activity   Alcohol use: No   Drug use: Never   Sexual activity: Not on file  Other Topics Concern   Not on file  Social History Narrative   Widowed 2013 originally from [place]  moved to [place] decades ago to work for [employer]   5 children   17 grandchildren      Does not have a living will-desires CPR, does not want prolonged life support if futile.   Social Drivers of Corporate investment banker Strain: Not on file  Food Insecurity: Unknown (04/27/2018)   Hunger Vital Sign    Worried About Running Out of Food in the Last Year: Patient declined    Ran Out of Food in the Last Year: Patient declined  Transportation Needs: Unknown (04/27/2018)   PRAPARE - Administrator, Civil Service (Medical): Patient declined    Lack of Transportation (Non-Medical): Patient declined  Physical Activity: Unknown (04/27/2018)   Exercise Vital Sign    Days of Exercise per Week: Patient declined    Minutes of Exercise per Session: Patient declined  Stress: No Stress Concern Present (04/27/2018)   [employer] of Occupational Health - Occupational Stress Questionnaire    Feeling of Stress : Not at all  Social Connections: Unknown (04/27/2018)   Social Connection and Isolation Panel    Frequency of Communication with Friends and Family: Patient declined    Frequency of Social Gatherings with Friends and Family: Patient declined    Attends Religious Services: Patient declined    Database administrator or Organizations: Patient declined    Attends Banker Meetings: Patient declined    Marital Status: Patient declined  Intimate Partner Violence: Unknown (04/27/2018)   Humiliation, Afraid, Rape, and Kick questionnaire    Fear of  Current or Ex-Partner: Patient declined    Emotionally Abused: Patient declined    Physically Abused: Patient declined    Sexually Abused: Patient declined     Allergies  Allergen Reactions   Feraheme  [Ferumoxytol ]  Shortness Of Breath and Palpitations    Flushed face; palpitations, SOB     Outpatient Medications Prior to Visit  Medication Sig Dispense Refill   acetaminophen  (TYLENOL ) 500 MG tablet Take 1,000 mg by mouth daily.     aspirin  EC 81 MG tablet Take 81 mg by mouth daily. Swallow whole.     Cyanocobalamin  (VITAMIN B 12 PO) Take 1 tablet by mouth 3 (three) times a week. Take 1 tablet by mouth 3 times a week. Take on Monday, Wednesday and Friday     glucose blood (TRUE METRIX BLOOD GLUCOSE TEST) test strip Use as instructed to test blood sugar once daily E08.638 100 each 2   lisinopril  (ZESTRIL ) 20 MG tablet Take 1 tablet (20 mg total) by mouth daily. 90 tablet 3   pravastatin  (PRAVACHOL ) 20 MG tablet Take 1 tablet (20 mg total) by mouth every evening. 90 tablet 1   No facility-administered medications prior to visit.        Objective:   Physical Exam Vitals:   09/07/23 1522  BP: 125/82  Pulse: 79  Temp: 97.6 F (36.4 C)  SpO2: 91%  Weight: 173 lb (78.5 kg)  Height: 5' 4 (1.626 m)   Gen: Pleasant, well-nourished, in no distress,  normal affect  ENT: No lesions,  mouth clear,  oropharynx clear, no postnasal drip  Neck: No JVD, no stridor  Lungs: No use of accessory muscles, no crackles or wheezing on normal respiration, no wheeze on forced expiration  Cardiovascular: RRR, heart sounds normal, no murmur or gallops, no peripheral edema  Musculoskeletal: No deformities, no cyanosis or clubbing  Neuro: alert, awake, non focal  Skin: Warm, no lesions or rash       Assessment & Plan:   No problem-specific Assessment & Plan notes found for this encounter.    Lamar Chris, MD, PhD 09/07/2023, 4:00 PM [facility] [phone] or  if no answer before 7:00PM call [phone] For any issues after 7:00PM please call eLink [phone]

## 2023-09-07 NOTE — Patient Instructions (Signed)
 We reviewed your CT scan of the chest today.  There is an enlarging left upper lobe pulmonary nodule that is suspicious for possible lung cancer. We talked about possible navigational bronchoscopy to repeat the biopsy of your lung nodule. Please review your Guardant Reveal test with Dr. Lanny and discuss whether she believes bronchoscopy and biopsy is indicated now.  If so we will arrange.  If not then we will continue to follow. Follow with Dr Shelah in 3 months or sooner if you have any problems.

## 2023-09-07 NOTE — H&P (View-Only) (Signed)
 Subjective:    Patient ID: Briana Ochoa, female    DOB: 03-05-1940, 83 y.o.   MRN: [id number]  HPI 83 year old woman with a history of former tobacco use (60 pack years), history of colon cancer, left breast cancer, bladder cancer, melanoma, pulmonary nodular disease with a navigational bronchoscopy and biopsies that were benign in 2023.  PMH also significant for diabetes, CKD stage III, hypertension.  She is followed by Dr. Lanny in oncology.  She is feeling well. No dyspnea, no cough.   CT scan of the chest 08/08/2023 reviewed by me showed no significant lymphadenopathy.  There are some calcified mediastinal nodes.  There is a spiculated 1.3 x 2 cm left upper lobe pulmonary nodule extending to the pleural surface that has enlarged compared with prior studies.  There is a stable 4 mm left upper lobe nodule, stable 7 mm posterior right lower lobe nodule and several other stable.   Review of Systems As per HPI  Past Medical History:  Diagnosis Date   Allergy    seasonal allergies   Anemia    on meds   Aortic atherosclerosis (HCC)    Bladder cancer (HCC) 2020   Bladder tumor    Bloody diarrhea 09/29/2017   Breast cancer (HCC) 2015   Left Breast Cancer   Cataract    bilateral -sx   CKD (chronic kidney disease), stage III (HCC)    DM related   Colon cancer (HCC)    Diabetes mellitus type 2, diet-controlled (HCC)    diet controlled   Dyspnea 09/28/2017   Dysuria    Family history of adverse reaction to anesthesia    aunt- N/V    Family history of breast cancer    Family history of kidney cancer    Family history of throat cancer    Family history of uterine cancer    Fatigue 09/28/2017   Frequency of urination    Grade I diastolic dysfunction 08/06/2014   Noted on ECHO   Gross hematuria 09/29/2017   Heart murmur    since rheumatic fever as child   Hematuria    History of cardiomegaly 02/12/2005   Mild, noted on CXR   History of CVA (cerebrovascular accident)  08/05/2014   post op cerebral angiogram, cerebral thrombosis w/ cerebral infarction;  11-02-2017  per pt no residuals   History of malignant melanoma of skin 12/2015   s/p wide local excision right lower leg (per pt localized)   History of rheumatic fever as a child    Hyperlipidemia    on meds   Hypertension    on meds   Intracranial aneurysm dx 07/ 2015   paraophthalmic RICA aneurysm /   s/p  Pipeline embolization right ICA 01-09-2014   Jaundice 09/28/2017   Malignant neoplasm of central portion of left breast in female, estrogen receptor positive [facility]) oncologist-  dr odean   dx 07/ 2015--- DCIS, ER/PR positive-- s/p  left breast lumpectomy 09-19-2013,  completed radiation 11-12-2013,  started arimidex  11-12-2013   Melanoma (HCC)    righ tleg    OA (osteoarthritis)    knees, hands, R shoulder   Osteopenia 10/16/2012   Diffuse   Personal history of radiation therapy    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   Sigmoid diverticulosis    Weakness 09/28/2017   Wears dentures    upper   Wears glasses    Wears hearing aid in both ears      Family  History  Problem Relation Age of Onset   Hypertension Mother    Kidney cancer Mother        dx early 48s   Heart attack Paternal Grandfather    Throat cancer Brother 67       hx smoking   Head & neck cancer Son 34       jaw cancer   Breast cancer Maternal Aunt 49   Breast cancer Paternal Aunt 57   Uterine cancer Cousin        dx early 28s (maternal first cousin)   Colon polyps Neg Hx    Colon cancer Neg Hx    Esophageal cancer Neg Hx    Rectal cancer Neg Hx    Stomach cancer Neg Hx      Social History   Socioeconomic History   Marital status: Widowed    Spouse name: Not on file   Number of children: 4   Years of education: Not on file   Highest education level: Not on file  Occupational History    Comment: Retired  Tobacco Use   Smoking status: Former    Current packs/day: 0.00    Average packs/day: 1.5  packs/day for 40.0 years (60.0 ttl pk-yrs)    Types: Cigarettes    Start date: 03/06/1967    Quit date: 03/06/2007    Years since quitting: 16.5   Smokeless tobacco: Never  Vaping Use   Vaping status: Never Used  Substance and Sexual Activity   Alcohol use: No   Drug use: Never   Sexual activity: Not on file  Other Topics Concern   Not on file  Social History Narrative   Widowed 2013 originally from [place]  moved to [place] decades ago to work for [employer]   5 children   17 grandchildren      Does not have a living will-desires CPR, does not want prolonged life support if futile.   Social Drivers of Corporate investment banker Strain: Not on file  Food Insecurity: Unknown (04/27/2018)   Hunger Vital Sign    Worried About Running Out of Food in the Last Year: Patient declined    Ran Out of Food in the Last Year: Patient declined  Transportation Needs: Unknown (04/27/2018)   PRAPARE - Administrator, Civil Service (Medical): Patient declined    Lack of Transportation (Non-Medical): Patient declined  Physical Activity: Unknown (04/27/2018)   Exercise Vital Sign    Days of Exercise per Week: Patient declined    Minutes of Exercise per Session: Patient declined  Stress: No Stress Concern Present (04/27/2018)   [employer] of Occupational Health - Occupational Stress Questionnaire    Feeling of Stress : Not at all  Social Connections: Unknown (04/27/2018)   Social Connection and Isolation Panel    Frequency of Communication with Friends and Family: Patient declined    Frequency of Social Gatherings with Friends and Family: Patient declined    Attends Religious Services: Patient declined    Database administrator or Organizations: Patient declined    Attends Banker Meetings: Patient declined    Marital Status: Patient declined  Intimate Partner Violence: Unknown (04/27/2018)   Humiliation, Afraid, Rape, and Kick questionnaire    Fear of  Current or Ex-Partner: Patient declined    Emotionally Abused: Patient declined    Physically Abused: Patient declined    Sexually Abused: Patient declined     Allergies  Allergen Reactions   Feraheme  [Ferumoxytol ]  Shortness Of Breath and Palpitations    Flushed face; palpitations, SOB     Outpatient Medications Prior to Visit  Medication Sig Dispense Refill   acetaminophen  (TYLENOL ) 500 MG tablet Take 1,000 mg by mouth daily.     aspirin  EC 81 MG tablet Take 81 mg by mouth daily. Swallow whole.     Cyanocobalamin  (VITAMIN B 12 PO) Take 1 tablet by mouth 3 (three) times a week. Take 1 tablet by mouth 3 times a week. Take on Monday, Wednesday and Friday     glucose blood (TRUE METRIX BLOOD GLUCOSE TEST) test strip Use as instructed to test blood sugar once daily E08.638 100 each 2   lisinopril  (ZESTRIL ) 20 MG tablet Take 1 tablet (20 mg total) by mouth daily. 90 tablet 3   pravastatin  (PRAVACHOL ) 20 MG tablet Take 1 tablet (20 mg total) by mouth every evening. 90 tablet 1   No facility-administered medications prior to visit.        Objective:   Physical Exam Vitals:   09/07/23 1522  BP: 125/82  Pulse: 79  Temp: 97.6 F (36.4 C)  SpO2: 91%  Weight: 173 lb (78.5 kg)  Height: 5' 4 (1.626 m)   Gen: Pleasant, well-nourished, in no distress,  normal affect  ENT: No lesions,  mouth clear,  oropharynx clear, no postnasal drip  Neck: No JVD, no stridor  Lungs: No use of accessory muscles, no crackles or wheezing on normal respiration, no wheeze on forced expiration  Cardiovascular: RRR, heart sounds normal, no murmur or gallops, no peripheral edema  Musculoskeletal: No deformities, no cyanosis or clubbing  Neuro: alert, awake, non focal  Skin: Warm, no lesions or rash       Assessment & Plan:   No problem-specific Assessment & Plan notes found for this encounter.    Lamar Chris, MD, PhD 09/07/2023, 4:00 PM [facility] [phone] or  if no answer before 7:00PM call [phone] For any issues after 7:00PM please call eLink [phone]

## 2023-09-08 ENCOUNTER — Other Ambulatory Visit: Payer: Self-pay | Admitting: Hematology

## 2023-09-08 DIAGNOSIS — I10 Essential (primary) hypertension: Secondary | ICD-10-CM

## 2023-09-08 NOTE — Assessment & Plan Note (Signed)
 We reviewed your CT scan of the chest today.  There is an enlarging left upper lobe pulmonary nodule that is suspicious for possible lung cancer. We talked about possible navigational bronchoscopy to repeat the biopsy of your lung nodule. Please review your Guardant Reveal test with Dr. Lanny and discuss whether she believes bronchoscopy and biopsy is indicated now.  If so we will arrange.  If not then we will continue to follow. Follow with Dr Shelah in 3 months or sooner if you have any problems.

## 2023-09-08 NOTE — Telephone Encounter (Signed)
 Prescribing Provider is not at this practice

## 2023-09-20 ENCOUNTER — Other Ambulatory Visit: Payer: Self-pay

## 2023-09-21 ENCOUNTER — Encounter: Payer: Self-pay | Admitting: Hematology

## 2023-09-21 ENCOUNTER — Inpatient Hospital Stay: Attending: Hematology | Admitting: Hematology

## 2023-09-21 DIAGNOSIS — Z9221 Personal history of antineoplastic chemotherapy: Secondary | ICD-10-CM | POA: Insufficient documentation

## 2023-09-21 DIAGNOSIS — R918 Other nonspecific abnormal finding of lung field: Secondary | ICD-10-CM | POA: Insufficient documentation

## 2023-09-21 DIAGNOSIS — Z8551 Personal history of malignant neoplasm of bladder: Secondary | ICD-10-CM | POA: Insufficient documentation

## 2023-09-21 DIAGNOSIS — Z85038 Personal history of other malignant neoplasm of large intestine: Secondary | ICD-10-CM | POA: Insufficient documentation

## 2023-09-21 DIAGNOSIS — C186 Malignant neoplasm of descending colon: Secondary | ICD-10-CM | POA: Diagnosis not present

## 2023-09-21 NOTE — Progress Notes (Signed)
 [facility]   [phone] [phone]   Clinic Follow up Note   Patient Care Team: Nche, Roselie Rockford, NP as PCP - General (Internal Medicine) Odean Potts, MD as Consulting Physician (Hematology and Oncology) Rosemarie Eather RAMAN, MD as Consulting Physician (Neurology) Lanis Pupa, MD as Consulting Physician (Neurosurgery) Charmayne Molly, MD as Consulting Physician (Ophthalmology) Lanny Callander, MD as Consulting Physician (Hematology and Oncology) Debby Hila, MD as Consulting Physician (General Surgery) 09/21/2023  I connected with Briana Ochoa on 09/21/23 at  4:00 PM EDT by telephone and verified that I am speaking with the correct person using two identifiers.   I discussed the limitations, risks, security and privacy concerns of performing an evaluation and management service by telephone and the availability of in person appointments. I also discussed with the patient that there may be a patient responsible charge related to this service. The patient expressed understanding and agreed to proceed.   Patient's location: Work Provider's location:  Office    CHIEF COMPLAINT: Follow-up lung nodules   CURRENT THERAPY:   Oncology history Cancer of left colon (HCC) pT3N0M0, Stage II, MSI-High -diagnosed on 04/23/20 by Colonoscopy performed for heme-positive stool and anemia. -resection with Dr Debby on 05/28/20 showed 9.7cm mass in splenic flexure with invasive adenocarcinoma, poorly differentiated, negative margins, negative LNs.  -GuardantReveal on 07/07/20 was positive -staging CT CAP w/o contrast on 08/31/20 showed NED. -She began Xeloda  and Keytruda  on 09/21/20. Xeloda  held due to increased Cr and then low EGFR from 12/04/20 - 02/09/21, and Keytruda  was held due to taste change/mouth numbness from 12/25/20 - 02/09/21. Completed Xeloda  in 04/2021 and Keytruda  on 09/17/21. -restaging CT from 09/06/2022, which showed no evidence of cancer  recurrence, I personally reviewed the images with her  -restaging CT from 04/04/2023 showed Interval increase in size of the 11 mm posterior left upper lobe pulmonary nodule with a new 4 mm right lower lobe pulmonary nodule -repeated CT chest 08/08/2023 showed increased LUL lung nodule from 1.3 to 2cm,  will obtain PET and GuardantReveal for further evaluation   Assessment & Plan Growing left lung nodule with history of colon cancer The left lower lobe lung nodule has shown significant growth since last year, although the growth from the scan five months ago to the most recent scan was not substantial. The recent GuardianReveal test was negative, which is specific to colon cancer and does not provide information about other potential malignancies such as lung cancer. A biopsy is recommended to determine the nature of the nodule, as it is important to confirm whether it is malignant to initiate treatment early if necessary. - Communicate with Doctor Ruther to arrange a biopsy of the lung nodule. - If the biopsy is positive, order a PET scan to evaluate for metastasis, particularly to the lymph nodes. - Discuss potential treatment options, including surgery and radiation, based on biopsy results and PET scan findings.  Plan - I recommend bronchoscopy and biopsy of the left lung nodule, I will message Dr. Shelah -f/u after biopsy if positive, otherwise I will see her back in January 2026 as scheduled.     SUMMARY OF ONCOLOGIC HISTORY: Oncology History Overview Note  Cancer Staging Cancer of left colon [facility]) Staging form: Colon and Rectum, AJCC 8th Edition - Pathologic stage from 05/28/2020: Stage IIA (pT3, pN0, cM0) - Signed by Lanny Callander, MD on 06/19/2020 Stage prefix: Initial diagnosis Total positive nodes: 0 Histologic grading system: 4 grade system Histologic grade (G): G3 Residual  tumor (R): R0 - None  Ductal carcinoma in situ (DCIS) of left breast Staging form: Breast, AJCC 7th Edition -  Clinical: Stage 0 (Tis (DCIS), N0, cM0) - Unsigned Specimen type: Core Needle Biopsy Histopathologic type: 9932 Laterality: Left Staging comments: Staged at breast conference 7.22.15  - Pathologic: Stage 0 (Tis (DCIS)(2), N0, cM0) - Unsigned Specimen type: Core Needle Biopsy Histopathologic type: 9932 Laterality: Left Tumor size (mm): 3 Multiple tumors: Yes Number of tumors: 2 Method of lymph node assessment: Clinical Method of detection of distant metastases: Clinical Residual tumor (R): R0 - None Estrogen receptor status: Positive Progesterone receptor status: Positive    Ductal carcinoma in situ (DCIS) of left breast  08/20/2013 Initial Diagnosis   Left breast 12:00: DCIS with calcifications, yet 100%, PR 100%; 09/04/2013 second group of calcifications also biopsied proven to be DCIS ER/PR positive   09/19/2013 Surgery   Left breast lumpectomy DCIS 2 foci margins negative, 0.2 and 0.3 cm ER 100% PR 100%   10/08/2013 - 11/12/2013 Radiation Therapy   Adjuvant radiation therapy   11/12/2013 - 2020 Anti-estrogen oral therapy   Anastrozole  1 mg daily plan is for 5 years   01/09/2014 - 01/10/2014 Hospital Admission   Pipeline embolization 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 The report date is 07/30/2020.   The CancerNext-Expanded + RNAinsight gene panel offered by [employer] and includes sequencing and rearrangement analysis for the following 77 genes: AIP, ALK, APC, ATM, AXIN2, BAP1, BARD1, BLM, BMPR1A, BRCA1, BRCA2, BRIP1, CDC73, CDH1, CDK4, CDKN1B, CDKN2A, CHEK2, CTNNA1, DICER1, FANCC, FH, FLCN, GALNT12, KIF1B, LZTR1, MAX, MEN1, MET, MLH1, MSH2, MSH3, MSH6, MUTYH, NBN, NF1, NF2, NTHL1, PALB2, PHOX2B, PMS2, POT1, PRKAR1A, PTCH1, PTEN, RAD51C, RAD51D, RB1, RECQL, RET, SDHA, SDHAF2, SDHB, SDHC, SDHD, SMAD4, SMARCA4, SMARCB1, SMARCE1, STK11, SUFU, TMEM127,  TP53, TSC1, TSC2, VHL and XRCC2 (sequencing and deletion/duplication); EGFR, EGLN1, HOXB13, KIT, MITF, PDGFRA, POLD1 and POLE (sequencing only); EPCAM and GREM1 (deletion/duplication only). RNA data is routinely analyzed for use in variant interpretation for all genes.   Bladder cancer (HCC)  02/09/2018 Initial Diagnosis   Bladder cancer (HCC)   07/30/2020 Genetic Testing   Negative genetic testing:  No pathogenic variants detected on the [employer] CancerNext-Expanded + RNAinsight panel. The report date is 07/30/2020.   The CancerNext-Expanded + RNAinsight gene panel offered by [employer] and includes sequencing and rearrangement analysis for the following 77 genes: AIP, ALK, APC, ATM, AXIN2, BAP1, BARD1, BLM, BMPR1A, BRCA1, BRCA2, BRIP1, CDC73, CDH1, CDK4, CDKN1B, CDKN2A, CHEK2, CTNNA1, DICER1, FANCC, FH, FLCN, GALNT12, KIF1B, LZTR1, MAX, MEN1, MET, MLH1, MSH2, MSH3, MSH6, MUTYH, NBN, NF1, NF2, NTHL1, PALB2, PHOX2B, PMS2, POT1, PRKAR1A, PTCH1, PTEN, RAD51C, RAD51D, RB1, RECQL, RET, SDHA, SDHAF2, SDHB, SDHC, SDHD, SMAD4, SMARCA4, SMARCB1, SMARCE1, STK11, SUFU, TMEM127, TP53, TSC1, TSC2, VHL and XRCC2 (sequencing and deletion/duplication); EGFR, EGLN1, HOXB13, KIT, MITF, PDGFRA, POLD1 and POLE (sequencing only); EPCAM and GREM1 (deletion/duplication only). RNA data is routinely analyzed for use in variant interpretation for all genes.   08/31/2020 Imaging   CT CAP w/o contrast  IMPRESSION: 1. No specific findings of active malignancy on today's noncontrast CT. There has been interval partial colectomy involving the tumor site with reanastomosis. Some faint stranding in the adjacent omentum is probably from scarring or fat necrosis, less likely due to early tumor given the lack of overt nodularity. Surveillance imaging is likely indicated, and PET-CT may  have a role in imaging follow up. 2. Several small pulmonary nodules, in addition to a larger mixed density nodule in the left upper lobe.  Surveillance is recommended. 3. Other imaging findings of potential clinical significance: Aortic Atherosclerosis (ICD10-I70.0). Coronary atherosclerosis. Mitral valve calcification. Small type 1 hiatal hernia. Splenectomy with small amount of regenerative splenic tissue as well as a venous varix arising from the splenic vein. Hyperdense left kidney lower pole exophytic homogeneous lesion, probably a complex cyst. Sigmoid colon diverticulosis. Multilevel lumbar spondylosis and degenerative disc disease.   Cancer of left colon (HCC)  04/23/2020 Procedure   Upper Endoscopy and Colonoscopy by Dr Avram IMPRESSION Erythematous mucosa in the antrum. Biopsied. - Small hiatal hernia. - Gastroesophageal flap valve classified as Hill Grade IV (no fold, wide open lumen, hiatal hernia present). - The examination was otherwise normal.   IMPRESSION - Decreased sphincter tone found on digital rectal exam. - Malignant partially obstructing tumor in the proximal descending colon. Biopsied. Tattooed. - Three 5 to 20 mm polyps in the sigmoid colon, removed piecemeal using a cold snare. Resected and retrieved. - Severe diverticulosis in the sigmoid colon. - I left several diminutive descending (proximal to tattoo), sigmoid and rectal polyps - The examination was otherwise normal on direct and retroflexion views.     04/23/2020 Initial Diagnosis   Diagnosis 1. Descending Colon Polyp, mass - ADENOCARCINOMA. 2. Sigmoid Colon Polyp, (3) - MULTIPLE FRAGMENTS OF TUBULAR ADENOMA. - MULTIPLE FRAGMENTS OF SESSILE SERRATED POLYP WITHOUT DYSPLASIA. - NO HIGH GRADE DYSPLASIA OR MALIGNANCY. Microscopic Comment 1. Dr. Rebbecca has reviewed the case. Dr. Avram was notified on 04/24/2020. 2. Despite being left sided the fragments have features of a sessile serrated polyp, as opposed to, hyperplastic polyp.   04/27/2020 Imaging   CT AP IMPRESSION: Bulky annular soft tissue mass involving the distal  transverse colon with extension into adjacent pericolonic fat, consistent with primary colon carcinoma.   No evidence of metastatic disease.   Colonic diverticulosis. No radiographic evidence of diverticulitis.   Aortic Atherosclerosis (ICD10-I70.0).     05/05/2020 Imaging   CT Chest IMPRESSION: Bulky annular soft tissue mass involving the distal transverse colon with extension into adjacent pericolonic fat, consistent with primary colon carcinoma.   No evidence of metastatic disease.   Colonic diverticulosis. No radiographic evidence of diverticulitis.   Aortic Atherosclerosis (ICD10-I70.0).     05/28/2020 Initial Diagnosis   Cancer of left colon (HCC)   05/28/2020 Cancer Staging   Staging form: Colon and Rectum, AJCC 8th Edition - Pathologic stage from 05/28/2020: Stage IIA (pT3, pN0, cM0) - Signed by Lanny Callander, MD on 06/19/2020 Stage prefix: Initial diagnosis Total positive nodes: 0 Histologic grading system: 4 grade system Histologic grade (G): G3 Residual tumor (R): R0 - None   05/28/2020 Surgery   XI ROBOT ASSISTED RESECTION OF SPLENIC FLEXURE by Dr Debby    FINAL MICROSCOPIC DIAGNOSIS:   A. COLON, SPLENIC FLEXURE, RESECTION:  - Invasive adenocarcinoma, poorly differentiated, spanning 9.7 cm.  - Tumor invades through muscularis propria into pericolonic tissue.  - Resection margins are negative.  - Tubular adenoma (X1).  - Sessile serrate polyp without dysplasia (x2).  - Diverticulosis.  - Polypectomy scar.  - Twenty-four of twenty-four lymph nodes negative for carcinoma (0/24).  - See oncology table.    07/07/2020 Miscellaneous   Guardant Reveal ctDNA positive MSI high   07/30/2020 Genetic Testing   Negative genetic testing:  No pathogenic variants detected on the [employer] CancerNext-Expanded + RNAinsight panel. The report  date is 07/30/2020.   The CancerNext-Expanded + RNAinsight gene panel offered by [employer] and includes sequencing and rearrangement  analysis for the following 77 genes: AIP, ALK, APC, ATM, AXIN2, BAP1, BARD1, BLM, BMPR1A, BRCA1, BRCA2, BRIP1, CDC73, CDH1, CDK4, CDKN1B, CDKN2A, CHEK2, CTNNA1, DICER1, FANCC, FH, FLCN, GALNT12, KIF1B, LZTR1, MAX, MEN1, MET, MLH1, MSH2, MSH3, MSH6, MUTYH, NBN, NF1, NF2, NTHL1, PALB2, PHOX2B, PMS2, POT1, PRKAR1A, PTCH1, PTEN, RAD51C, RAD51D, RB1, RECQL, RET, SDHA, SDHAF2, SDHB, SDHC, SDHD, SMAD4, SMARCA4, SMARCB1, SMARCE1, STK11, SUFU, TMEM127, TP53, TSC1, TSC2, VHL and XRCC2 (sequencing and deletion/duplication); EGFR, EGLN1, HOXB13, KIT, MITF, PDGFRA, POLD1 and POLE (sequencing only); EPCAM and GREM1 (deletion/duplication only). RNA data is routinely analyzed for use in variant interpretation for all genes.   08/31/2020 Imaging   CT CAP w/o contrast  IMPRESSION: 1. No specific findings of active malignancy on today's noncontrast CT. There has been interval partial colectomy involving the tumor site with reanastomosis. Some faint stranding in the adjacent omentum is probably from scarring or fat necrosis, less likely due to early tumor given the lack of overt nodularity. Surveillance imaging is likely indicated, and PET-CT may have a role in imaging follow up. 2. Several small pulmonary nodules, in addition to a larger mixed density nodule in the left upper lobe. Surveillance is recommended. 3. Other imaging findings of potential clinical significance: Aortic Atherosclerosis (ICD10-I70.0). Coronary atherosclerosis. Mitral valve calcification. Small type 1 hiatal hernia. Splenectomy with small amount of regenerative splenic tissue as well as a venous varix arising from the splenic vein. Hyperdense left kidney lower pole exophytic homogeneous lesion, probably a complex cyst. Sigmoid colon diverticulosis. Multilevel lumbar spondylosis and degenerative disc disease.   10/02/2020 - 09/17/2021 Chemotherapy   Patient is on Treatment Plan : COLORECTAL Pembrolizumab  q21d     03/01/2021 Imaging    EXAM: CT CHEST, ABDOMEN AND PELVIS WITHOUT CONTRAST  IMPRESSION: 1. Mixed attenuation lesion posterior left upper lobe has areas of more confluent and enlarging nodularity today. Interval progression raises concern for neoplasm. Consider PET-CT to further evaluate. 2. Stable 7 mm right lower lobe pulmonary nodule with a slightly more conspicuous 3 mm nodule in the right lower lobe. Continued close attention on follow-up recommended. 3. No evidence for metastatic disease in the abdomen or pelvis. 4. Aortic Atherosclerosis (ICD10-I70.0).   03/17/2021 PET scan   IMPRESSION: 1. Part solid nodule of the left upper lobe demonstrates mild FDG uptake, and has increased in size when compared with prior exam dated May 05, 2020. Concerning for indolent primary lung neoplasm. 2. No evidence of metastatic disease in the chest, abdomen or pelvis. 3. 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 Recommend further evaluation with tissue sampling. 4. Aortic Atherosclerosis (ICD10-I70.0).   03/15/2022 Imaging    IMPRESSION: Stable irregular solid pulmonary nodule in posterior left upper lobe.   No evidence of recurrent or metastatic disease within the abdomen or pelvis.   Colonic diverticulosis, without radiographic evidence of diverticulitis.     Discussed the use of AI scribe software for clinical note transcription with the patient, who gave verbal consent to proceed.  History of Present Illness Briana Ochoa is an 83 year old female who presents for a virtual visit to review a CT scan and discuss a lung nodule. She was referred by Dr. Ruther for evaluation of a lung nodule and to determine the need for a biopsy.  She has a history of colon cancer and recently underwent a Guardian test, which was  negative for colon cancer. A CT scan from a year ago, a scan from three months ago, and the most recent scan from a month ago were reviewed.  She previously had a biopsy performed, but the nodule has changed since then. She is currently asymptomatic with no new symptoms. Her brother is in hospice care in [place]  with cancer in his kidney, liver, and possibly lungs, along with cirrhosis.     REVIEW OF SYSTEMS:   Constitutional: Denies fevers, chills or abnormal weight loss Eyes: Denies blurriness of vision Ears, nose, mouth, throat, and face: Denies mucositis or sore throat Respiratory: Denies cough, dyspnea or wheezes Cardiovascular: Denies palpitation, chest discomfort or lower extremity swelling Gastrointestinal:  Denies nausea, heartburn or change in bowel habits Skin: Denies abnormal skin rashes Lymphatics: Denies new lymphadenopathy or easy bruising Neurological:Denies numbness, tingling or new weaknesses Behavioral/Psych: Mood is stable, no new changes  All other systems were reviewed with the patient and are negative.  MEDICAL HISTORY:  Past Medical History:  Diagnosis Date   Allergy    seasonal allergies   Anemia    on meds   Aortic atherosclerosis (HCC)    Bladder cancer (HCC) 2020   Bladder tumor    Bloody diarrhea 09/29/2017   Breast cancer (HCC) 2015   Left Breast Cancer   Cataract    bilateral -sx   CKD (chronic kidney disease), stage III (HCC)    DM related   Colon cancer (HCC)    Diabetes mellitus type 2, diet-controlled (HCC)    diet controlled   Dyspnea 09/28/2017   Dysuria    Family history of adverse reaction to anesthesia    aunt- N/V    Family history of breast cancer    Family history of kidney cancer    Family history of throat cancer    Family history of uterine cancer    Fatigue 09/28/2017   Frequency of urination    Grade I diastolic dysfunction 08/06/2014   Noted on ECHO   Gross hematuria 09/29/2017   Heart murmur    since rheumatic fever as child   Hematuria    History of cardiomegaly 02/12/2005   Mild, noted on CXR   History of CVA (cerebrovascular accident) 08/05/2014    post op cerebral angiogram, cerebral thrombosis w/ cerebral infarction;  11-02-2017  per pt no residuals   History of malignant melanoma of skin 12/2015   s/p wide local excision right lower leg (per pt localized)   History of rheumatic fever as a child    Hyperlipidemia    on meds   Hypertension    on meds   Intracranial aneurysm dx 07/ 2015   paraophthalmic RICA aneurysm /   s/p  Pipeline embolization right ICA 01-09-2014   Jaundice 09/28/2017   Malignant neoplasm of central portion of left breast in female, estrogen receptor positive [facility]) oncologist-  dr odean   dx 07/ 2015--- DCIS, ER/PR positive-- s/p  left breast lumpectomy 09-19-2013,  completed radiation 11-12-2013,  started arimidex  11-12-2013   Melanoma (HCC)    righ tleg    OA (osteoarthritis)    knees, hands, R shoulder   Osteopenia 10/16/2012   Diffuse   Personal history of radiation therapy    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   Sigmoid diverticulosis    Weakness 09/28/2017   Wears dentures    upper   Wears glasses    Wears hearing aid in both ears     SURGICAL  HISTORY: Past Surgical History:  Procedure Laterality Date   BREAST LUMPECTOMY Left 2015   BREAST LUMPECTOMY WITH NEEDLE LOCALIZATION Left 09/19/2013   Procedure: LEFT BREAST LUMPECTOMY WITH DOUBLE WIRE BRACKETED  NEEDLE LOCALIZATION;  Surgeon: Elon CHRISTELLA Pacini, MD;  Location: [facility] OR;  Service: General;  Laterality: Left;   BRONCHIAL BIOPSY  04/06/2021   Procedure: BRONCHIAL BIOPSIES;  Surgeon: Brenna Adine CROME, DO;  Location: [facility];  Service: Pulmonary;;   BRONCHIAL BRUSHINGS  04/06/2021   Procedure: BRONCHIAL BRUSHINGS;  Surgeon: Brenna Adine CROME, DO;  Location: [facility];  Service: Pulmonary;;   CATARACT EXTRACTION W/ INTRAOCULAR LENS  IMPLANT, BILATERAL  06/2015   CESAREAN SECTION  1964   CHOLECYSTECTOMY OPEN  1970s   CYSTOSCOPY W/ URETERAL STENT PLACEMENT Left 11/07/2017   Procedure: CYSTOSCOPY WITH RETROGRADE  PYELOGRAM/URETERAL STENT PLACEMENT;  Surgeon: Sherrilee Belvie CROME, MD;  Location: [facility];  Service: Urology;  Laterality: Left;   CYSTOSCOPY W/ URETERAL STENT PLACEMENT Bilateral 04/27/2018   Procedure: CYSTOSCOPY WITH RETROGRADE PYELOGRAM;  Surgeon: Sherrilee Belvie CROME, MD;  Location: [facility];  Service: Urology;  Laterality: Bilateral;   FINGER SURGERY     lt thumb  from dog bite   IR  NEPHROURETERAL CATH PLACE LEFT  04/28/2018   MELANOMA EXCISION Right 12/28/2015   Procedure: EXCISION MELANOMA RIGHT LOWER LEG;  Surgeon: Elon Pacini, MD;  Location: [facility];  Service: General;  Laterality: Right;   RADIOLOGY WITH ANESTHESIA N/A 01/09/2014   Procedure: Embolization;  Surgeon: Gerldine Maizes, MD;  Location: [facility] OR;  Service: Radiology;  Laterality: N/A;   SPLENECTOMY, TOTAL  2009 approx.   splen injury due to fall off horse   TONSILLECTOMY  child   TRANSURETHRAL RESECTION OF BLADDER TUMOR N/A 11/07/2017   Procedure: TRANSURETHRAL RESECTION OF BLADDER TUMOR (TURBT), POSSIBLE STENT PLACEMENT;  Surgeon: Sherrilee Belvie CROME, MD;  Location: [facility];  Service: Urology;  Laterality: N/A;   TRANSURETHRAL RESECTION OF BLADDER TUMOR N/A 12/14/2017   Procedure: TRANSURETHRAL RESECTION OF BLADDER TUMOR (TURBT);  Surgeon: Sherrilee Belvie CROME, MD;  Location: [facility];  Service: Urology;  Laterality: N/A;  30 MINS   TRANSURETHRAL RESECTION OF BLADDER TUMOR N/A 04/27/2018   Procedure: TRANSURETHRAL RESECTION OF BLADDER TUMOR (TURBT);  Surgeon: Sherrilee Belvie CROME, MD;  Location: [facility];  Service: Urology;  Laterality: N/A;  30 MINS   VIDEO BRONCHOSCOPY WITH RADIAL ENDOBRONCHIAL ULTRASOUND  04/06/2021   Procedure: RADIAL ENDOBRONCHIAL ULTRASOUND;  Surgeon: Brenna Adine CROME, DO;  Location: [facility];  Service: Pulmonary;;    I have reviewed the social history and family history with the patient and  they are unchanged from previous note.  ALLERGIES:  is allergic to feraheme  [ferumoxytol ].  MEDICATIONS:  Current Outpatient Medications  Medication Sig Dispense Refill   acetaminophen  (TYLENOL ) 500 MG tablet Take 1,000 mg by mouth daily.     aspirin  EC 81 MG tablet Take 81 mg by mouth daily. Swallow whole.     Cyanocobalamin  (VITAMIN B 12 PO) Take 1 tablet by mouth 3 (three) times a week. Take 1 tablet by mouth 3 times a week. Take on Monday, Wednesday and Friday     glucose blood (TRUE METRIX BLOOD GLUCOSE TEST) test strip Use as instructed to test blood sugar once daily E08.638 100 each 2   lisinopril  (ZESTRIL ) 20 MG tablet Take 1 tablet (20 mg total) by mouth daily. 90 tablet 3   pravastatin  (PRAVACHOL ) 20 MG  tablet Take 1 tablet (20 mg total) by mouth every evening. 90 tablet 1   No current facility-administered medications for this visit.    PHYSICAL EXAMINATION: Not performed   LABORATORY DATA:  I have reviewed the data as listed    Latest Ref Rng & Units 08/22/2023    3:27 PM 08/08/2023   12:02 PM 04/04/2023    3:24 PM  CBC  WBC 4.0 - 10.5 K/uL 9.8  10.2  9.9   Hemoglobin 12.0 - 15.0 g/dL 85.4  84.7  84.5   Hematocrit 36.0 - 46.0 % 44.0  45.0  47.0   Platelets 150 - 400 K/uL 270  287  307         Latest Ref Rng & Units 08/22/2023    3:27 PM 08/08/2023   12:02 PM 07/11/2023    2:51 PM  CMP  Glucose 70 - 99 mg/dL 864  897  97   BUN 8 - 23 mg/dL 28  27  26    Creatinine 0.44 - 1.00 mg/dL 8.40  8.57  8.55   Sodium 135 - 145 mmol/L 140  139  140   Potassium 3.5 - 5.1 mmol/L 4.6  4.9  4.7   Chloride 98 - 111 mmol/L 107  105  105   CO2 22 - 32 mmol/L 26  27  27    Calcium  8.9 - 10.3 mg/dL 9.5  9.7  9.6   Total Protein 6.5 - 8.1 g/dL 6.8  6.9    Total Bilirubin 0.0 - 1.2 mg/dL 0.3  0.4    Alkaline Phos 38 - 126 U/L 89  84    AST 15 - 41 U/L 13  12    ALT 0 - 44 U/L 8  10        RADIOGRAPHIC STUDIES: I have personally reviewed the radiological images as listed and  agreed with the findings in the report. No results found.     I discussed the assessment and treatment plan with the patient. The patient was provided an opportunity to ask questions and all were answered. The patient agreed with the plan and demonstrated an understanding of the instructions.   The patient was advised to call back or seek an in-person evaluation if the symptoms worsen or if the condition fails to improve as anticipated.  I provided 15 minutes of non face-to-face telephone visit time during this encounter, including review of chart and various tests results, discussions about plan of care and coordination of care plan.    Onita Mattock, MD 09/21/23

## 2023-09-21 NOTE — Assessment & Plan Note (Signed)
 [id number], Stage II, MSI-High -diagnosed on 04/23/20 by Colonoscopy performed for heme-positive stool and anemia. -resection with Dr Debby on 05/28/20 showed 9.7cm mass in splenic flexure with invasive adenocarcinoma, poorly differentiated, negative margins, negative LNs.  -GuardantReveal on 07/07/20 was positive -staging CT CAP w/o contrast on 08/31/20 showed NED. -She began Xeloda  and Keytruda  on 09/21/20. Xeloda  held due to increased Cr and then low EGFR from 12/04/20 - 02/09/21, and Keytruda  was held due to taste change/mouth numbness from 12/25/20 - 02/09/21. Completed Xeloda  in 04/2021 and Keytruda  on 09/17/21. -restaging CT from 09/06/2022, which showed no evidence of cancer recurrence, I personally reviewed the images with her  -restaging CT from 04/04/2023 showed Interval increase in size of the 11 mm posterior left upper lobe pulmonary nodule with a new 4 mm right lower lobe pulmonary nodule -repeated CT chest 08/08/2023 showed increased LUL lung nodule from 1.3 to 2cm,  will obtain PET and GuardantReveal for further evaluation

## 2023-09-28 ENCOUNTER — Telehealth: Payer: Self-pay | Admitting: Emergency Medicine

## 2023-09-28 DIAGNOSIS — R918 Other nonspecific abnormal finding of lung field: Secondary | ICD-10-CM

## 2023-09-28 NOTE — Telephone Encounter (Signed)
 Please schedule the following:  Provider performing procedure: [place]ia [place] Diagnosis: Bilateral pulmonary nodules Which side if for nodule / mass?  Bilateral Procedure: Robotic assisted navigational bronchoscopy Has patient been spoken to by Provider and given informed consent?  Yes Anesthesia: General Do you need Fluro?  Yes Duration of procedure: 60 minutes Date: 10/02/2023 Alternate Date: Next available Time: Any Location: [facility] Does patient have OSA?  No DM?  No or Latex allergy?  No Medication Restriction/ Anticoagulate/Antiplatelet: Stop aspirin  2 days prior Pre-op Labs Ordered:determined by Anesthesia Imaging request: CT chest available in PACS (If, SuperDimension CT Chest, please have STAT courier sent to ENDO)

## 2023-09-29 ENCOUNTER — Other Ambulatory Visit: Payer: Self-pay

## 2023-09-29 ENCOUNTER — Encounter: Payer: Self-pay | Admitting: Emergency Medicine

## 2023-09-29 ENCOUNTER — Encounter (HOSPITAL_COMMUNITY): Payer: Self-pay | Admitting: Emergency Medicine

## 2023-09-29 NOTE — Progress Notes (Signed)
 Anesthesia Chart Review: Same day workup  83 year old woman with a history of former tobacco use (60 pack years), history of colon cancer s/p resection of splenic flexure 05/2020 and adjuvant chemotherapy, left breast cancer s/p lumpectomy and adjuvant radiation in 2015, bladder cancer s/p TURBT, melanoma RLL s/p excision, pulmonary nodular disease with a navigational bronchoscopy and biopsies that were benign in 2023.  PMH also significant for diabetes, CKD stage III, hypertension, intracranial aneurysm s/p pipeline embolization 2015, CVA 2016, diet-controlled DM2, s/p total splenectomy in 2009.  She is followed by Dr. Lanny in oncology.   Recent CT chest showed enlarging left upper lobe nodule suspicious for possible lung cancer.  Bronchoscopy with biopsy recommended.  Patient will need day of surgery labs and evaluation.  EKG 05/21/2020: Sinus rhythm.  Rate 86. Borderline intraventricular conduction delay. Low voltage, precordial leads. Abnormal R-wave progression, early transition  CT chest 08/08/2023: IMPRESSION: 1. Spiculated left upper lung pulmonary nodule today measures 1.3 x 2 cm, increased from 1.3 x 1.1 cm previously. 2. Additional pulmonary nodules measure above are unchanged. 3. Aortic atherosclerosis.  TTE 08/06/2014: - Left ventricle: The cavity size was normal. There was mild focal    basal hypertrophy of the septum. Systolic function was normal.    Wall motion was normal; there were no regional wall motion    abnormalities. Doppler parameters are consistent with abnormal    left ventricular relaxation (grade 1 diastolic dysfunction).    There was no evidence of elevated ventricular filling pressure by    Doppler parameters.  - Aortic valve: There was no regurgitation.  - Aortic root: The aortic root was normal in size.  - Ascending aorta: The ascending aorta was normal in size.  - Mitral valve: Calcified annulus. Mildly thickened leaflets .  - Left atrium: The atrium was  normal in size.  - Tricuspid valve: There was no regurgitation.  - Pulmonic valve: There was mild regurgitation.  - Pulmonary arteries: Systolic pressure was within the normal    range.  - Inferior vena cava: The vessel was normal in size.  - Pericardium, extracardiac: A trivial pericardial effusion was    identified.      Lynwood Geofm RIGGERS [facility] Short Stay Center/Anesthesiology Phone [phone] 09/29/2023 12:20 PM

## 2023-09-29 NOTE — Telephone Encounter (Signed)
 I have called the patient with appt info per [facility] no auth needed Case# [id number]

## 2023-09-29 NOTE — Anesthesia Preprocedure Evaluation (Addendum)
 Anesthesia Evaluation  Patient identified by MRN, date of birth, ID band Patient awake    Reviewed: Allergy & Precautions, NPO status , Patient's Chart, lab work & pertinent test results  History of Anesthesia Complications (+) Family history of anesthesia reaction  Airway Mallampati: II  TM Distance: >3 FB     Dental  (+) Upper Dentures, Missing   Pulmonary shortness of breath and with exertion, former smoker Hx/o Pulmonary nodules   Pulmonary exam normal breath sounds clear to auscultation       Cardiovascular hypertension, Pt. on medications + DOE  Normal cardiovascular exam+ Valvular Problems/Murmurs  Rhythm:Regular Rate:Normal     Neuro/Psych Hx/o intracranial aneurysm S/P pipeline embolization 01/09/2014 Hx/o CVA 2016 CVA, No Residual Symptoms  negative psych ROS   GI/Hepatic Neg liver ROS,,,Hx/ diverticulosis Hx/o colon Ca   Endo/Other  diabetes, Well Controlled, Type 2  Hyperlipidemia  Renal/GU Renal InsufficiencyRenal disease   Hx/o Bladder Ca    Musculoskeletal  (+) Arthritis , Osteoarthritis,  Hx/o melanoma S/P WLE   Abdominal  (+) + obese  Peds  Hematology  (+) Blood dyscrasia, anemia   Anesthesia Other Findings   Reproductive/Obstetrics                              Anesthesia Physical Anesthesia Plan  ASA: 3  Anesthesia Plan: General   Post-op Pain Management: Minimal or no pain anticipated, Precedex and Tylenol  PO (pre-op)*   Induction: Intravenous  PONV Risk Score and Plan: 4 or greater and Treatment may vary due to age or medical condition, TIVA, Propofol  infusion and Ondansetron   Airway Management Planned: Oral ETT  Additional Equipment: None  Intra-op Plan:   Post-operative Plan: Extubation in OR  Informed Consent: I have reviewed the patients History and Physical, chart, labs and discussed the procedure including the risks, benefits and alternatives  for the proposed anesthesia with the patient or authorized representative who has indicated his/her understanding and acceptance.     Dental advisory given  Plan Discussed with: Anesthesiologist and CRNA  Anesthesia Plan Comments: (PAT note by Lynwood Hope, PA-C: 83 year old woman with a history of former tobacco use (60 pack years), history of colon cancer s/p resection of splenic flexure 05/2020 and adjuvant chemotherapy, left breast cancer s/p lumpectomy and adjuvant radiation in 2015, bladder cancer s/p TURBT, melanoma RLL s/p excision, pulmonary nodular disease with a navigational bronchoscopy and biopsies that were benign in 2023.  PMH also significant for diabetes, CKD stage III, hypertension, intracranial aneurysm s/p pipeline embolization 2015, CVA 2016, diet-controlled DM2, s/p total splenectomy in 2009.  She is followed by Dr. Lanny in oncology.   Recent CT chest showed enlarging left upper lobe nodule suspicious for possible lung cancer.  Bronchoscopy with biopsy recommended.  Patient will need day of surgery labs and evaluation.  EKG 05/21/2020: Sinus rhythm.  Rate 86. Borderline intraventricular conduction delay. Low voltage, precordial leads. Abnormal R-wave progression, early transition  CT chest 08/08/2023: IMPRESSION: 1. Spiculated left upper lung pulmonary nodule today measures 1.3 x 2 cm, increased from 1.3 x 1.1 cm previously. 2. Additional pulmonary nodules measure above are unchanged. 3. Aortic atherosclerosis.  TTE 08/06/2014: - Left ventricle: The cavity size was normal. There was mild focal    basal hypertrophy of the septum. Systolic function was normal.    Wall motion was normal; there were no regional wall motion    abnormalities. Doppler parameters are consistent with abnormal  left ventricular relaxation (grade 1 diastolic dysfunction).    There was no evidence of elevated ventricular filling pressure by    Doppler parameters.  - Aortic valve: There was no  regurgitation.  - Aortic root: The aortic root was normal in size.  - Ascending aorta: The ascending aorta was normal in size.  - Mitral valve: Calcified annulus. Mildly thickened leaflets .  - Left atrium: The atrium was normal in size.  - Tricuspid valve: There was no regurgitation.  - Pulmonic valve: There was mild regurgitation.  - Pulmonary arteries: Systolic pressure was within the normal    range.  - Inferior vena cava: The vessel was normal in size.  - Pericardium, extracardiac: A trivial pericardial effusion was    identified.   )         Anesthesia Quick Evaluation

## 2023-09-29 NOTE — Progress Notes (Signed)
 SDW CALL  Patient was given pre-op instructions over the phone. The opportunity was given for the patient to ask questions. No further questions asked. Patient verbalized understanding of instructions given.   PCP - Roselie Mood at [facility] Cardiologist - denies  PPM/ICD - denies   Chest CT-08/08/23 EKG - DOS Stress Test - denies ECHO - 08/06/14 Cardiac Cath -   Sleep Study - denies CPAP - n/a  Fasting Blood Sugar - unknown - patient does not check blood sugars at home at this time   Last dose of GLP1 agonist-  n/a GLP1 instructions:  n/a  Blood Thinner Instructions: n/a Aspirin  Instructions: last dose of Aspirin  is 09/29/23  ERAS Protcol - NPO   COVID TEST- n/a   Anesthesia review: yes- HTN, DM, history of CVA, heart murmur  Patient denies shortness of breath, fever, cough and chest pain over the phone call   All instructions explained to the patient, with a verbal understanding of the material. Patient agrees to go over the instructions while at home for a better understanding.

## 2023-10-01 ENCOUNTER — Encounter (HOSPITAL_COMMUNITY): Payer: Self-pay | Admitting: Emergency Medicine

## 2023-10-02 ENCOUNTER — Ambulatory Visit (HOSPITAL_COMMUNITY): Payer: Self-pay | Admitting: Physician Assistant

## 2023-10-02 ENCOUNTER — Ambulatory Visit (HOSPITAL_COMMUNITY)
Admission: RE | Admit: 2023-10-02 | Discharge: 2023-10-02 | Disposition: A | Discharge status: Home / Self Care | Attending: Emergency Medicine | Admitting: Emergency Medicine

## 2023-10-02 ENCOUNTER — Ambulatory Visit (HOSPITAL_COMMUNITY)

## 2023-10-02 ENCOUNTER — Encounter (HOSPITAL_COMMUNITY)
Admission: RE | Disposition: A | Discharge status: Home / Self Care | Payer: Self-pay | Source: Home / Self Care | Attending: Emergency Medicine

## 2023-10-02 ENCOUNTER — Other Ambulatory Visit: Payer: Self-pay

## 2023-10-02 ENCOUNTER — Encounter (HOSPITAL_COMMUNITY): Payer: Self-pay | Admitting: Emergency Medicine

## 2023-10-02 DIAGNOSIS — I517 Cardiomegaly: Secondary | ICD-10-CM | POA: Diagnosis not present

## 2023-10-02 DIAGNOSIS — R011 Cardiac murmur, unspecified: Secondary | ICD-10-CM | POA: Insufficient documentation

## 2023-10-02 DIAGNOSIS — Z8673 Personal history of transient ischemic attack (TIA), and cerebral infarction without residual deficits: Secondary | ICD-10-CM | POA: Insufficient documentation

## 2023-10-02 DIAGNOSIS — I129 Hypertensive chronic kidney disease with stage 1 through stage 4 chronic kidney disease, or unspecified chronic kidney disease: Secondary | ICD-10-CM | POA: Insufficient documentation

## 2023-10-02 DIAGNOSIS — Z8551 Personal history of malignant neoplasm of bladder: Secondary | ICD-10-CM | POA: Insufficient documentation

## 2023-10-02 DIAGNOSIS — C3412 Malignant neoplasm of upper lobe, left bronchus or lung: Secondary | ICD-10-CM | POA: Insufficient documentation

## 2023-10-02 DIAGNOSIS — Z9049 Acquired absence of other specified parts of digestive tract: Secondary | ICD-10-CM | POA: Diagnosis not present

## 2023-10-02 DIAGNOSIS — J9811 Atelectasis: Secondary | ICD-10-CM | POA: Diagnosis not present

## 2023-10-02 DIAGNOSIS — D500 Iron deficiency anemia secondary to blood loss (chronic): Secondary | ICD-10-CM

## 2023-10-02 DIAGNOSIS — Z923 Personal history of irradiation: Secondary | ICD-10-CM | POA: Diagnosis not present

## 2023-10-02 DIAGNOSIS — R911 Solitary pulmonary nodule: Secondary | ICD-10-CM | POA: Diagnosis not present

## 2023-10-02 DIAGNOSIS — Z9221 Personal history of antineoplastic chemotherapy: Secondary | ICD-10-CM | POA: Insufficient documentation

## 2023-10-02 DIAGNOSIS — Z87891 Personal history of nicotine dependence: Secondary | ICD-10-CM

## 2023-10-02 DIAGNOSIS — Z85820 Personal history of malignant melanoma of skin: Secondary | ICD-10-CM | POA: Diagnosis not present

## 2023-10-02 DIAGNOSIS — E785 Hyperlipidemia, unspecified: Secondary | ICD-10-CM

## 2023-10-02 DIAGNOSIS — N183 Chronic kidney disease, stage 3 unspecified: Secondary | ICD-10-CM | POA: Diagnosis not present

## 2023-10-02 DIAGNOSIS — I10 Essential (primary) hypertension: Secondary | ICD-10-CM | POA: Diagnosis not present

## 2023-10-02 DIAGNOSIS — Z794 Long term (current) use of insulin: Secondary | ICD-10-CM | POA: Insufficient documentation

## 2023-10-02 DIAGNOSIS — Z48813 Encounter for surgical aftercare following surgery on the respiratory system: Secondary | ICD-10-CM | POA: Diagnosis not present

## 2023-10-02 DIAGNOSIS — Z9081 Acquired absence of spleen: Secondary | ICD-10-CM | POA: Diagnosis not present

## 2023-10-02 DIAGNOSIS — Z853 Personal history of malignant neoplasm of breast: Secondary | ICD-10-CM | POA: Diagnosis not present

## 2023-10-02 DIAGNOSIS — R846 Abnormal cytological findings in specimens from respiratory organs and thorax: Secondary | ICD-10-CM | POA: Diagnosis not present

## 2023-10-02 DIAGNOSIS — E1122 Type 2 diabetes mellitus with diabetic chronic kidney disease: Secondary | ICD-10-CM | POA: Diagnosis not present

## 2023-10-02 DIAGNOSIS — Z85038 Personal history of other malignant neoplasm of large intestine: Secondary | ICD-10-CM | POA: Insufficient documentation

## 2023-10-02 DIAGNOSIS — R918 Other nonspecific abnormal finding of lung field: Secondary | ICD-10-CM | POA: Diagnosis not present

## 2023-10-02 DIAGNOSIS — N1832 Chronic kidney disease, stage 3b: Secondary | ICD-10-CM | POA: Diagnosis not present

## 2023-10-02 DIAGNOSIS — I700 Atherosclerosis of aorta: Secondary | ICD-10-CM | POA: Diagnosis not present

## 2023-10-02 HISTORY — PX: VIDEO BRONCHOSCOPY WITH ENDOBRONCHIAL NAVIGATION: SHX6175

## 2023-10-02 LAB — COMPREHENSIVE METABOLIC PANEL WITH GFR
ALT: 12 U/L (ref 0–44)
AST: 16 U/L (ref 15–41)
Albumin: 3.2 g/dL — ABNORMAL LOW (ref 3.5–5.0)
Alkaline Phosphatase: 77 U/L (ref 38–126)
Anion gap: 6 (ref 5–15)
BUN: 26 mg/dL — ABNORMAL HIGH (ref 8–23)
CO2: 23 mmol/L (ref 22–32)
Calcium: 9.1 mg/dL (ref 8.9–10.3)
Chloride: 109 mmol/L (ref 98–111)
Creatinine, Ser: 1.66 mg/dL — ABNORMAL HIGH (ref 0.44–1.00)
GFR, Estimated: 30 mL/min — ABNORMAL LOW (ref >60)
Glucose, Bld: 142 mg/dL — ABNORMAL HIGH (ref 70–99)
Potassium: 4.8 mmol/L (ref 3.5–5.1)
Sodium: 138 mmol/L (ref 135–145)
Total Bilirubin: 0.6 mg/dL (ref 0.0–1.2)
Total Protein: 6.4 g/dL — ABNORMAL LOW (ref 6.5–8.1)

## 2023-10-02 LAB — CBC
HCT: 46.3 % — ABNORMAL HIGH (ref 36.0–46.0)
Hemoglobin: 15.1 g/dL — ABNORMAL HIGH (ref 12.0–15.0)
MCH: 31.3 pg (ref 26.0–34.0)
MCHC: 32.6 g/dL (ref 30.0–36.0)
MCV: 95.9 fL (ref 80.0–100.0)
Platelets: 274 K/uL (ref 150–400)
RBC: 4.83 MIL/uL (ref 3.87–5.11)
RDW: 13.9 % (ref 11.5–15.5)
WBC: 9 K/uL (ref 4.0–10.5)
nRBC: 0 % (ref 0.0–0.2)

## 2023-10-02 LAB — GLUCOSE, CAPILLARY
Glucose-Capillary: 146 mg/dL — ABNORMAL HIGH (ref 70–99)
Glucose-Capillary: 139 mg/dL — ABNORMAL HIGH (ref 70–99)

## 2023-10-02 SURGERY — VIDEO BRONCHOSCOPY WITH ENDOBRONCHIAL NAVIGATION
Anesthesia: General | Laterality: Bilateral

## 2023-10-02 MED ORDER — PROPOFOL 10 MG/ML IV BOLUS
INTRAVENOUS | Status: DC | PRN
Start: 2023-10-02 — End: 2023-10-02
  Administered 2023-10-02: 20 mg via INTRAVENOUS
  Administered 2023-10-02: 160 mg via INTRAVENOUS

## 2023-10-02 MED ORDER — INSULIN ASPART 100 UNIT/ML IJ SOLN: 0.0000 [IU] | INTRAMUSCULAR | Status: DC | PRN

## 2023-10-02 MED ORDER — CHLORHEXIDINE GLUCONATE 0.12 % MT SOLN: 15.0000 mL | Freq: Once | OROMUCOSAL | Status: DC

## 2023-10-02 MED ORDER — CHLORHEXIDINE GLUCONATE 0.12 % MT SOLN
OROMUCOSAL | Status: AC
  Administered 2023-10-02: 15 mL
  Filled 2023-10-02: qty 15

## 2023-10-02 MED ORDER — FENTANYL CITRATE (PF) 100 MCG/2ML IJ SOLN: 25.0000 ug | INTRAMUSCULAR | Status: DC | PRN

## 2023-10-02 MED ORDER — PHENYLEPHRINE 80 MCG/ML (10ML) SYRINGE FOR IV PUSH (FOR BLOOD PRESSURE SUPPORT)
PREFILLED_SYRINGE | INTRAVENOUS | Status: DC | PRN
  Administered 2023-10-02: 160 ug via INTRAVENOUS
  Administered 2023-10-02 (×5): 80 ug via INTRAVENOUS

## 2023-10-02 MED ORDER — AMISULPRIDE (ANTIEMETIC) 5 MG/2ML IV SOLN: 10.0000 mg | Freq: Once | INTRAVENOUS | Status: DC | PRN

## 2023-10-02 MED ORDER — DEXAMETHASONE SODIUM PHOSPHATE 10 MG/ML IJ SOLN
INTRAMUSCULAR | Status: DC | PRN
  Administered 2023-10-02: 5 mg via INTRAVENOUS

## 2023-10-02 MED ORDER — SUGAMMADEX SODIUM 200 MG/2ML IV SOLN
INTRAVENOUS | Status: DC | PRN
  Administered 2023-10-02: 200 mg via INTRAVENOUS

## 2023-10-02 MED ORDER — ONDANSETRON HCL 4 MG/2ML IJ SOLN
INTRAMUSCULAR | Status: DC | PRN
  Administered 2023-10-02: 4 mg via INTRAVENOUS

## 2023-10-02 MED ORDER — ROCURONIUM BROMIDE 10 MG/ML (PF) SYRINGE
PREFILLED_SYRINGE | INTRAVENOUS | Status: DC | PRN
  Administered 2023-10-02: 50 mg via INTRAVENOUS
  Administered 2023-10-02: 20 mg via INTRAVENOUS

## 2023-10-02 MED ORDER — ONDANSETRON HCL 4 MG/2ML IJ SOLN: 4.0000 mg | Freq: Once | INTRAMUSCULAR | Status: DC | PRN

## 2023-10-02 MED ORDER — LIDOCAINE 2% (20 MG/ML) 5 ML SYRINGE
INTRAMUSCULAR | Status: DC | PRN
  Administered 2023-10-02: 90 mg via INTRAVENOUS
  Administered 2023-10-02: 10 mg via INTRAVENOUS

## 2023-10-02 MED ORDER — LACTATED RINGERS IV SOLN: INTRAVENOUS | Status: DC

## 2023-10-02 MED ORDER — PHENYLEPHRINE HCL-NACL 20-0.9 MG/250ML-% IV SOLN
INTRAVENOUS | Status: DC | PRN
Start: 2023-10-02 — End: 2023-10-02
  Administered 2023-10-02: 25 ug/min via INTRAVENOUS

## 2023-10-02 SURGICAL SUPPLY — 36 items
ADAPTER BRONCHOSCOPE OLYMPUS (ADAPTER) ×2 IMPLANT
ADAPTER VALVE BIOPSY EBUS (MISCELLANEOUS) IMPLANT
BAG COUNTER SPONGE SURGICOUNT (BAG) ×2 IMPLANT
BRUSH CYTOL CELLEBRITY 1.5X140 (MISCELLANEOUS) ×2 IMPLANT
BRUSH SUPERTRAX BIOPSY (INSTRUMENTS) IMPLANT
BRUSH SUPERTRAX NDL-TIP CYTO (INSTRUMENTS) ×2 IMPLANT
CANISTER SUCTION 3000ML PPV (SUCTIONS) ×2 IMPLANT
CNTNR URN SCR LID CUP LEK RST (MISCELLANEOUS) ×2 IMPLANT
COVER BACK TABLE 60X90IN (DRAPES) ×2 IMPLANT
FILTER STRAW FLUID ASPIR (MISCELLANEOUS) IMPLANT
FORCEPS BIOP 1.5 SINGLE USE (MISCELLANEOUS) ×2 IMPLANT
FORCEPS BIOP SUPERTRX PREMAR (INSTRUMENTS) ×2 IMPLANT
GAUZE SPONGE 4X4 12PLY STRL (GAUZE/BANDAGES/DRESSINGS) ×2 IMPLANT
GLOVE BIO SURGEON STRL SZ7.5 (GLOVE) ×4 IMPLANT
GOWN STRL REUS W/ TWL LRG LVL3 (GOWN DISPOSABLE) ×4 IMPLANT
KIT CLEAN ENDO COMPLIANCE (KITS) ×2 IMPLANT
KIT LOCATABLE GUIDE (CANNULA) IMPLANT
KIT MARKER FIDUCIAL DELIVERY (KITS) IMPLANT
KIT TURNOVER KIT B (KITS) ×2 IMPLANT
MARKER SKIN DUAL TIP RULER LAB (MISCELLANEOUS) ×2 IMPLANT
NDL SUPERTRX PREMARK BIOPSY (NEEDLE) ×2 IMPLANT
NEEDLE SUPERTRX PREMARK BIOPSY (NEEDLE) ×1 IMPLANT
NS IRRIG 1000ML POUR BTL (IV SOLUTION) ×2 IMPLANT
OIL SILICONE PENTAX (PARTS (SERVICE/REPAIRS)) ×2 IMPLANT
PAD ARMBOARD POSITIONER FOAM (MISCELLANEOUS) ×4 IMPLANT
PATCHES PATIENT (LABEL) ×6 IMPLANT
SYR 20ML ECCENTRIC (SYRINGE) ×2 IMPLANT
SYR 20ML LL LF (SYRINGE) ×2 IMPLANT
SYR 50ML SLIP (SYRINGE) ×2 IMPLANT
TOWEL GREEN STERILE FF (TOWEL DISPOSABLE) ×2 IMPLANT
TRAP SPECIMEN MUCUS 40CC (MISCELLANEOUS) IMPLANT
TUBE CONNECTING 20X1/4 (TUBING) ×2 IMPLANT
UNDERPAD 30X36 HEAVY ABSORB (UNDERPADS AND DIAPERS) ×2 IMPLANT
VALVE BIOPSY SINGLE USE (MISCELLANEOUS) ×2 IMPLANT
VALVE SUCTION BRONCHIO DISP (MISCELLANEOUS) ×2 IMPLANT
WATER STERILE IRR 1000ML POUR (IV SOLUTION) ×2 IMPLANT

## 2023-10-02 NOTE — Discharge Instructions (Addendum)
 Flexible Bronchoscopy, Care After This sheet gives you information about how to care for yourself after your test. Your doctor may also give you more specific instructions. If you have problems or questions, contact your doctor. Follow these instructions at home: Eating and drinking When you are wide awake, your numbness is gone and your cough and gag reflexes have come back, you may: Start eating only soft foods. Slowly drink liquids. Six hours after the test, go back to your normal diet. Driving Do not drive for 24 hours if you were given a medicine to help you relax (sedative). Do not drive or use heavy machinery while taking prescription pain medicine. General instructions Take over-the-counter and prescription medicines only as told by your doctor. Return to your normal activities as told. Ask what activities are safe for you. Do not use any products that have nicotine or tobacco in them. This includes cigarettes and e-cigarettes. If you need help quitting, ask your doctor. Keep all follow-up visits as told by your doctor. This is important. It is very important if you had a tissue sample (biopsy) taken. Get help right away if: You have shortness of breath that gets worse. You get light-headed. You feel like you are going to pass out (faint). You have chest pain. You cough up: More than a little blood. More blood than before. Summary Do not use cigarettes. Do not use e-cigarettes. Seek care in the Emergency Department right away if you have chest pain or shortness of breath. Call or MyChart Message our office for any questions or problems at [phone].  Okay to restart aspirin  on 10/03/2023.   This information is not intended to replace advice given to you by your health care provider. Make sure you discuss any questions you have with your health care provider.

## 2023-10-02 NOTE — Transfer of Care (Signed)
 Immediate Anesthesia Transfer of Care Note  Patient: Briana Ochoa  Procedure(s) Performed: VIDEO BRONCHOSCOPY WITH ENDOBRONCHIAL NAVIGATION (Bilateral)  Patient Location: PACU  Anesthesia Type:General  Level of Consciousness: awake, drowsy, and patient cooperative  Airway & Oxygen Therapy: Patient Spontanous Breathing and Patient connected to face mask oxygen  Post-op Assessment: Report given to RN, Post -op Vital signs reviewed and stable, and Patient moving all extremities X 4  Post vital signs: Reviewed and stable  Last Vitals:  Vitals Value Taken Time  BP    Temp    Pulse 83 10/02/23 08:55  Resp 17 10/02/23 08:55  SpO2 86 % 10/02/23 08:55  Vitals shown include unfiled device data.  Last Pain:  Vitals:   10/02/23 0622  TempSrc:   PainSc: 0-No pain         Complications: No notable events documented.

## 2023-10-02 NOTE — Anesthesia Procedure Notes (Signed)
 Procedure Name: Intubation Date/Time: 10/02/2023 7:32 AM  Performed by: Dianne Burnard RAMAN, CRNAPre-anesthesia Checklist: Patient identified, Emergency Drugs available, Suction available and Patient being monitored Patient Re-evaluated:Patient Re-evaluated prior to induction Oxygen Delivery Method: Circle system utilized Preoxygenation: Pre-oxygenation with 100% oxygen Induction Type: IV induction Ventilation: Mask ventilation without difficulty Laryngoscope Size: McGrath and 3 Grade View: Grade I Tube type: Oral Tube size: 8.5 mm Number of attempts: 1 Airway Equipment and Method: Stylet and Oral airway Placement Confirmation: ETT inserted through vocal cords under direct vision, positive ETCO2 and breath sounds checked- equal and bilateral Secured at: 20 cm Tube secured with: Tape Dental Injury: Teeth and Oropharynx as per pre-operative assessment

## 2023-10-02 NOTE — Op Note (Signed)
 Video Bronchoscopy with Robotic Assisted Bronchoscopic Navigation   Date of Operation: 10/02/2023   Pre-op Diagnosis: Left upper lobe nodule  Post-op Diagnosis: Same  Surgeon: Lamar Chris  Assistants: None  Anesthesia: General endotracheal anesthesia  Operation: Flexible video fiberoptic bronchoscopy with robotic assistance and biopsies.  Estimated Blood Loss: Minimal  Complications: None  Indications and History: Briana Ochoa is a 83 y.o. female with history of Left upper lobe pulmonary nodule that has increased slightly in size on serial imaging.  Recommendation made to achieve a tissue diagnosis via robotic assisted navigational bronchoscopy.  The risks, benefits, complications, treatment options and expected outcomes were discussed with the patient.  The possibilities of pneumothorax, pneumonia, reaction to medication, pulmonary aspiration, perforation of a viscus, bleeding, failure to diagnose a condition and creating a complication requiring transfusion or operation were discussed with the patient who freely signed the consent.    Description of Procedure: The patient was seen in the Preoperative Area, was examined and was deemed appropriate to proceed.  The patient was taken to [facility] room 3, identified as Briana Ochoa and the procedure verified as Flexible Video Fiberoptic Bronchoscopy.  A Time Out was held and the above information confirmed.   Prior to the date of the procedure a high-resolution CT scan of the chest was performed. Utilizing ION software program a virtual tracheobronchial tree was generated to allow the creation of distinct navigation pathways to the patient's parenchymal abnormalities. After being taken to the operating room general anesthesia was initiated and the patient  was orally intubated. The video fiberoptic bronchoscope was introduced via the endotracheal tube and a general inspection was performed which showed normal right and left  lung anatomy. Aspiration of the bilateral mainstems was completed to remove any remaining secretions. Robotic catheter inserted into patient's endotracheal tube.   Target #1 left upper lobe nodule: The distinct navigation pathways prepared prior to this procedure were then utilized to navigate to patient's lesion identified on CT scan.  Navigation was somewhat challenging due to location, airway narrowing and also prior fiducial marker in place.  The robotic catheter was secured into place and the vision probe was withdrawn.  Lesion location was approximated using fluoroscopy.  Local registration and targeting was performed using [employer] Cios mobile C-arm three-dimensional imaging. Under fluoroscopic guidance transbronchial needle biopsies and transbronchial cryoprobe biopsies were performed to be sent for cytology and pathology.  Needle-in-lesion was confirmed using Cios mobile C-arm.  A bronchioalveolar lavage was performed in the left upper lobe and sent for cytology and microbiology.      At the end of the procedure a general airway inspection was performed and there was no evidence of active bleeding. The bronchoscope was removed.  The patient tolerated the procedure well. There was no significant blood loss and there were no obvious complications. A post-procedural chest x-ray is pending.  Samples Target #1: 1. Transbronchial Wang needle biopsies from left upper lobe nodule 2. Transbronchial cryoprobe biopsies from left upper lobe nodule 3. Bronchoalveolar lavage from left upper lobe   Plans:  The patient will be discharged from the PACU to home when recovered from anesthesia and after chest x-ray is reviewed. We will review the cytology, pathology and microbiology results with the patient when they become available. Outpatient followup will be with CANDIE Lites, NP, Dr Chris Lamar Chris, MD, PhD 10/02/2023, 8:47 AM [facility] [phone] or if no  answer before 7:00PM call [phone] For any issues after  7:00PM please call eLink [phone] .

## 2023-10-02 NOTE — Anesthesia Postprocedure Evaluation (Signed)
 Anesthesia Post Note  Patient: Briana Ochoa  Procedure(s) Performed: VIDEO BRONCHOSCOPY WITH ENDOBRONCHIAL NAVIGATION (Bilateral)     Patient location during evaluation: PACU Anesthesia Type: General Level of consciousness: awake and alert and oriented Pain management: pain level controlled Vital Signs Assessment: post-procedure vital signs reviewed and stable Respiratory status: spontaneous breathing, nonlabored ventilation and respiratory function stable Cardiovascular status: blood pressure returned to baseline and stable Postop Assessment: no apparent nausea or vomiting Anesthetic complications: no   No notable events documented.  Last Vitals:  Vitals:   10/02/23 0915 10/02/23 0930  BP: 125/74 133/67  Pulse: 78 76  Resp: (!) 21 12  Temp:  36.6 C  SpO2: 97% 97%    Last Pain:  Vitals:   10/02/23 0930  TempSrc:   PainSc: 0-No pain                 Shanicka Oldenkamp A.

## 2023-10-02 NOTE — Interval H&P Note (Signed)
 History and Physical Interval Note:  10/02/2023 7:20 AM  Briana Ochoa  has presented today for surgery, with the diagnosis of bilateral pulmonary nodules.  The various methods of treatment have been discussed with the patient and family. After consideration of risks, benefits and other options for treatment, the patient has consented to  Procedure(s): VIDEO BRONCHOSCOPY WITH ENDOBRONCHIAL NAVIGATION (Bilateral) as a surgical intervention.  The patient's history has been reviewed, patient examined, no change in status, stable for surgery.  I have reviewed the patient's chart and labs.  Questions were answered to the patient's satisfaction.     Briana Ochoa

## 2023-10-02 NOTE — Op Note (Signed)
 Procedure Note  Patient: Briana Ochoa  [employer] Cios mobile C-arm was utilized to identify and biopsy left upper lobe pulmonary nodule.  Needle-in-lesion was confirmed using real-time Cios imaging, and images were uploaded to PACS.   Lamar Chris, MD, PhD 10/02/2023, 8:50 AM [facility] [phone] or if no answer before 7:00PM call [phone] For any issues after 7:00PM please call eLink [phone]

## 2023-10-03 ENCOUNTER — Encounter (HOSPITAL_COMMUNITY): Payer: Self-pay | Admitting: Emergency Medicine

## 2023-10-03 LAB — ACID FAST SMEAR (AFB, MYCOBACTERIA): Acid Fast Smear: NEGATIVE

## 2023-10-04 LAB — CYTOLOGY - NON PAP

## 2023-10-04 LAB — CULTURE, BAL-QUANTITATIVE W GRAM STAIN
Culture: NO GROWTH
Gram Stain: NONE SEEN

## 2023-10-07 LAB — AEROBIC/ANAEROBIC CULTURE W GRAM STAIN (SURGICAL/DEEP WOUND)
Culture: NO GROWTH
Gram Stain: NONE SEEN

## 2023-10-11 ENCOUNTER — Ambulatory Visit: Admitting: Acute Care

## 2023-10-11 ENCOUNTER — Encounter: Payer: Self-pay | Admitting: Acute Care

## 2023-10-11 VITALS — BP 136/82 | HR 70 | Temp 97.5°F | Ht 64.0 in | Wt 195.2 lb

## 2023-10-11 DIAGNOSIS — C3490 Malignant neoplasm of unspecified part of unspecified bronchus or lung: Secondary | ICD-10-CM

## 2023-10-11 DIAGNOSIS — Z9889 Other specified postprocedural states: Secondary | ICD-10-CM

## 2023-10-11 DIAGNOSIS — Z87891 Personal history of nicotine dependence: Secondary | ICD-10-CM | POA: Diagnosis not present

## 2023-10-11 DIAGNOSIS — C3412 Malignant neoplasm of upper lobe, left bronchus or lung: Secondary | ICD-10-CM

## 2023-10-11 DIAGNOSIS — R9389 Abnormal findings on diagnostic imaging of other specified body structures: Secondary | ICD-10-CM | POA: Diagnosis not present

## 2023-10-11 NOTE — Progress Notes (Signed)
 History of Present Illness Briana Ochoa is a 83 y.o. female former smoker ( Quit 2009 with a 60 pack year smoking history) with history of colon cancer, left breast cancer, bladder cancer, melanoma, pulmonary nodular disease. She was referred back to Dr. Byrum for consideration of a repeat bronchoscopy with biopsy by Dr. Lanny 09/2023.  Synopsis 83 year old woman with a history of former tobacco use (60 pack years), history of colon cancer, left breast cancer, bladder cancer, melanoma, pulmonary nodular disease with a navigational bronchoscopy and biopsies that were benign in 2023.  PMH also significant for diabetes, CKD stage III, hypertension.  She is followed by Dr. Lanny in oncology. CT scan of the chest 08/08/2023 reviewed by me showed no significant lymphadenopathy. There are some calcified mediastinal nodes. There is a spiculated 1.3 x 2 cm left upper lobe pulmonary nodule extending to the pleural surface that has enlarged compared with prior studies. There is a stable 4 mm left upper lobe nodule, stable 7 mm posterior right lower lobe nodule and several other stable.    10/11/2023 Discussed the use of AI scribe software for clinical note transcription with the patient, who gave verbal consent to proceed.  History of Present Illness Pt. Presents for follow up with her daughter after robotic assisted navigational bronchoscopy with biopsies. She states she has done well after the procedure. No significant bleeding, no fever, discolored secretions, dyspnea or adverse reaction to anesthesia.   We have reviewed the results of her biopsy. We discussed that the biopsy of the left upper lobe was positive for adenocarcinoma which is a non-small cell lung cancer.  Additionally the lavage of the left upper lobe was positive for atypical cells.  Both the patient and her daughter were significantly concerned about the diagnosis.  We discussed that the next step would be to complete staging to ensure that  there is no disease in any other organ system.  I have ordered a PET scan as well as an MRI brain to complete staging.  If this nodule is a single solitary nodule with no other disease plan will be to refer to either surgery to be evaluated for a resection or to radiation oncology for consideration for SBRT.  Patient is a former smoker but quit about 16 years ago.  I have ordered pulmonary function testing to see whether or not she is a surgical candidate.  Testing has been scheduled for a week from today with the hope that pulmonary function tests will be completed along with MRI brain and PET scan to help determine best options for treatment moving forward.  I have sent a message to Dr. Ileana and asked to see if she could see the patient sooner than January 2026 to discuss treatment options once staging is completed.  If patient is unable to get a sooner office visit with Dr. Lanny I have told her to call the office and I will go over the results of the MRI, PET scan and pulmonary function test with her.  Patient's daughter lost her husband to non-small cell cancer and has been very triggered by this diagnosis and her mom.  We discussed that if no other disease is present this is very treatable.    Test Results: 10/02/2023 LUNG, LUL, FINE NEEDLE ASPIRATION  BIOPSY:  - Adenocarcinoma  - See comment   B. LUNG, LUL, LAVAGE:    FINAL MICROSCOPIC DIAGNOSIS:  - Atypical cells present   CT chest without contrast 08/08/2023 Spiculated left upper lung  pulmonary nodule today measures 1.3 x 2 cm, increased from 1.3 x 1.1 cm previously. 2. Additional pulmonary nodules measure above are unchanged. 3. Aortic atherosclerosis.    Latest Ref Rng & Units 10/02/2023    6:04 AM 08/22/2023    3:27 PM 08/08/2023   12:02 PM  CBC  WBC 4.0 - 10.5 K/uL 9.0  9.8  10.2   Hemoglobin 12.0 - 15.0 g/dL 84.8  85.4  84.7   Hematocrit 36.0 - 46.0 % 46.3  44.0  45.0   Platelets 150 - 400 K/uL 274  270  287         Latest Ref Rng & Units 10/02/2023    6:04 AM 08/22/2023    3:27 PM 08/08/2023   12:02 PM  BMP  Glucose 70 - 99 mg/dL 857  864  897   BUN 8 - 23 mg/dL 26  28  27    Creatinine 0.44 - 1.00 mg/dL 8.33  8.40  8.57   Sodium 135 - 145 mmol/L 138  140  139   Potassium 3.5 - 5.1 mmol/L 4.8  4.6  4.9   Chloride 98 - 111 mmol/L 109  107  105   CO2 22 - 32 mmol/L 23  26  27    Calcium  8.9 - 10.3 mg/dL 9.1  9.5  9.7     BNP    Component Value Date/Time   BNP 70.6 04/08/2020 1158    ProBNP No results found for: PROBNP  PFT No results found for: FEV1PRE, FEV1POST, FVCPRE, FVCPOST, TLC, DLCOUNC, PREFEV1FVCRT, PSTFEV1FVCRT  DG Chest Port 1 View Result Date: 10/02/2023 CLINICAL DATA:  Status post bronchoscopy with biopsy. EXAM: PORTABLE CHEST 1 VIEW COMPARISON:  04/06/2021 and CT chest 08/08/2023. FINDINGS: Patient is slightly rotated. Trachea is midline. Heart is enlarged. Thoracic aorta is calcified. Mild streaky bibasilar atelectasis. Fiducial marker in the left perihilar region. No pneumothorax. Surgical clips in the left breast. IMPRESSION: 1. No pneumothorax status post bronchoscopy and biopsy. 2. Bibasilar streaky atelectasis. Electronically Signed   By: Newell Eke M.D.   On: 10/02/2023 10:23   DG C-ARM BRONCHOSCOPY Result Date: 10/02/2023 C-ARM BRONCHOSCOPY: Fluoroscopy was utilized by the requesting physician.  No radiographic interpretation.     Past medical hx Past Medical History:  Diagnosis Date   Allergy    seasonal allergies   Anemia    on meds   Aortic atherosclerosis (HCC)    Bladder cancer (HCC) 2020   Bladder tumor    Bloody diarrhea 09/29/2017   Breast cancer (HCC) 2015   Left Breast Cancer   Cataract    bilateral -sx   CKD (chronic kidney disease), stage III (HCC)    DM related   Colon cancer (HCC)    Diabetes mellitus type 2, diet-controlled (HCC)    diet controlled   Dyspnea 09/28/2017   Dysuria    Family history of adverse reaction to  anesthesia    aunt- N/V    Family history of breast cancer    Family history of kidney cancer    Family history of throat cancer    Family history of uterine cancer    Fatigue 09/28/2017   Frequency of urination    Grade I diastolic dysfunction 08/06/2014   Noted on ECHO   Gross hematuria 09/29/2017   Heart murmur    since rheumatic fever as child   Hematuria    History of cardiomegaly 02/12/2005   Mild, noted on CXR   History of CVA (cerebrovascular accident) 08/05/2014  post op cerebral angiogram, cerebral thrombosis w/ cerebral infarction;  11-02-2017  per pt no residuals   History of malignant melanoma of skin 12/2015   s/p wide local excision right lower leg (per pt localized)   History of rheumatic fever as a child    Hyperlipidemia    on meds   Hypertension    on meds   Intracranial aneurysm dx 07/ 2015   paraophthalmic RICA aneurysm /   s/p  Pipeline embolization right ICA 01-09-2014   Jaundice 09/28/2017   Malignant neoplasm of central portion of left breast in female, estrogen receptor positive [facility]) oncologist-  dr odean   dx 07/ 2015--- DCIS, ER/PR positive-- s/p  left breast lumpectomy 09-19-2013,  completed radiation 11-12-2013,  started arimidex  11-12-2013   Melanoma (HCC)    righ tleg    OA (osteoarthritis)    knees, hands, R shoulder   Osteopenia 10/16/2012   Diffuse   Personal history of radiation therapy    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   Sigmoid diverticulosis    Weakness 09/28/2017   Wears dentures    upper   Wears glasses    Wears hearing aid in both ears      Social History   Tobacco Use   Smoking status: Former    Current packs/day: 0.00    Average packs/day: 1.5 packs/day for 40.0 years (60.0 ttl pk-yrs)    Types: Cigarettes    Start date: 03/06/1967    Quit date: 03/06/2007    Years since quitting: 16.6    Passive exposure: Past   Smokeless tobacco: Never  Vaping Use   Vaping status: Never Used  Substance Use  Topics   Alcohol use: No   Drug use: Never    Briana Ochoa reports that she quit smoking about 16 years ago. Her smoking use included cigarettes. She started smoking about 56 years ago. She has a 60 pack-year smoking history. She has been exposed to tobacco smoke. She has never used smokeless tobacco. She reports that she does not drink alcohol and does not use drugs.  Tobacco Cessation: Counseling given: Not Answered Former smoker with a 60-pack-year smoking history quit 2009  Past surgical hx, Family hx, Social hx all reviewed.  Current Outpatient Medications on File Prior to Visit  Medication Sig   acetaminophen  (TYLENOL ) 500 MG tablet Take 1,000 mg by mouth daily.   aspirin  EC 81 MG tablet Take 81 mg by mouth daily. Swallow whole.   Cyanocobalamin  (VITAMIN B 12 PO) Take 1 tablet by mouth 3 (three) times a week. Take 1 tablet by mouth 3 times a week. Take on Monday, Wednesday and Friday   glucose blood (TRUE METRIX BLOOD GLUCOSE TEST) test strip Use as instructed to test blood sugar once daily E08.638   lisinopril  (ZESTRIL ) 20 MG tablet Take 1 tablet (20 mg total) by mouth daily.   pravastatin  (PRAVACHOL ) 20 MG tablet Take 1 tablet (20 mg total) by mouth every evening.   No current facility-administered medications on file prior to visit.     Allergies  Allergen Reactions   Feraheme  [Ferumoxytol ] Shortness Of Breath and Palpitations    Flushed face; palpitations, SOB    Review Of Systems:  Constitutional:   No  weight loss, night sweats,  Fevers, chills, fatigue, or  lassitude.  HEENT:   No headaches,  Difficulty swallowing,  Tooth/dental problems, or  Sore throat,  No sneezing, itching, ear ache, nasal congestion, post nasal drip,   CV:  No chest pain,  Orthopnea, PND, swelling in lower extremities, anasarca, dizziness, palpitations, syncope.   GI  No heartburn, indigestion, abdominal pain, nausea, vomiting, diarrhea, change in bowel habits, loss of  appetite, bloody stools.   Resp: No shortness of breath with exertion or at rest.  No excess mucus, no productive cough,   + non-productive cough,  No coughing up of blood.  No change in color of mucus.  No wheezing.  No chest wall deformity  Skin: no rash or lesions.  GU: no dysuria, change in color of urine, no urgency or frequency.  No flank pain, no hematuria   MS:  No joint pain or swelling.  No decreased range of motion.  No back pain.  Psych:  No change in mood or affect. No depression or anxiety.  No memory loss.   Vital Signs BP 136/82   Pulse 70   Temp (!) 97.5 F (36.4 C) (Oral)   Ht 5' 4 (1.626 m)   Wt 195 lb 3.2 oz (88.5 kg)   SpO2 96%   BMI 33.51 kg/m    Physical Exam:  General- No distress,  A&Ox3, very pleasant ENT: No sinus tenderness, TM clear, pale nasal mucosa, no oral exudate,no post nasal drip, no LAN Cardiac: S1, S2, regular rate and rhythm, no murmur Chest: No wheeze/ rales/ dullness; no accessory muscle use, no nasal flaring, no sternal retractions Abd.: Soft Non-tender, nondistended, bowel sounds positive,Body mass index is 33.51 kg/m.  Ext: No clubbing cyanosis, edema, no obvious deformities Neuro:  normal strength, moving all extremities x 4, alert and oriented x 3 Skin: No rashes, warm and dry, no obvious skin lesions Psych: normal mood and behavior  Assessment & Plan New diagnosis adenocarcinoma of the left upper lobe Post bronchoscopy with biopsies Former smoker quit 16 years ago History of breast, melanoma, bladder and colon cancer Previous radiation therapy immunotherapy and chemotherapy Plan I am glad you have done well since the biopsy. We have reviewed your biopsy results. They were positive for adenocarcinoma in the Left Upper Lobe of the lung. I have ordered a PET scan and MRI of the brain to complete staging of the lung cancer. You will get calls to get this scheduled.  I have also ordered PFT's to determine if you are a  surgical candidate. These are scheduled 10/17/2023 at 1 pm here in the office . I have sent a message to Dr. Lanny to get you an earlier appointment to be seen. If you do not have a follow up with Dr. Lanny in the near future, we will set up a video visit with me to review the results of the PET scan and the MRI Brain, as well as the PFT's.( To determine if you are a surgical candidate) Call if  you need us  sooner . Please contact office for sooner follow up if symptoms do not improve or worsen or seek emergency care    I spent 30 minutes dedicated to the care of this patient on the date of this encounter to include pre-visit review of records, face-to-face time with the patient discussing conditions above, post visit ordering of testing, clinical documentation with the electronic health record, making appropriate referrals as documented, and communicating necessary information to the patient's healthcare team.      Briana JULIANNA Lites, NP 10/11/2023  1:31 PM

## 2023-10-11 NOTE — Patient Instructions (Signed)
 It is good to see you today. I am glad you have done well since the biopsy. We have reviewed your biopsy results. They were positive for adenocarcinoma in the Left Upper Lobe of the lung. I have ordered a PET scan and MRI of the brain to complete staging of the lung cancer. You will get calls to get this scheduled.  I have also ordered PFT's to determine if you are a surgical candidate. These are scheduled 10/17/2023 at 1 pm here in the office . I have sent a message to Dr. Lanny to get you an earlier appointment to be seen. If you do not have a follow up with Dr. Lanny in the near future, we will set up a video visit with me to review the results of the PET scan and the MRI Brain, as well as the PFT's.( To determine if you are a surgical candidate) Call if  you need us  sooner . Please contact office for sooner follow up if symptoms do not improve or worsen or seek emergency care

## 2023-10-12 ENCOUNTER — Telehealth: Payer: Self-pay | Admitting: Nurse Practitioner

## 2023-10-12 NOTE — Telephone Encounter (Signed)
 Called patient and spoke, She is  aware  of her appt.

## 2023-10-17 ENCOUNTER — Ambulatory Visit (INDEPENDENT_AMBULATORY_CARE_PROVIDER_SITE_OTHER): Admitting: Emergency Medicine

## 2023-10-17 DIAGNOSIS — C3412 Malignant neoplasm of upper lobe, left bronchus or lung: Secondary | ICD-10-CM

## 2023-10-17 LAB — PULMONARY FUNCTION TEST
DL/VA % pred: 100 %
DL/VA: 4.05 ml/min/mmHg/L
DLCO cor % pred: 74 %
DLCO cor: 13.93 ml/min/mmHg
DLCO unc % pred: 78 %
DLCO unc: 14.61 ml/min/mmHg
FEF 25-75 Post: 1.73 L/s
FEF 25-75 Pre: 1.41 L/s
FEF2575-%Change-Post: 22 %
FEF2575-%Pred-Post: 137 %
FEF2575-%Pred-Pre: 112 %
FEV1-%Change-Post: 3 %
FEV1-%Pred-Post: 92 %
FEV1-%Pred-Pre: 89 %
FEV1-Post: 1.69 L
FEV1-Pre: 1.64 L
FEV1FVC-%Change-Post: 0 %
FEV1FVC-%Pred-Pre: 106 %
FEV6-%Change-Post: 3 %
FEV6-%Pred-Post: 92 %
FEV6-%Pred-Pre: 89 %
FEV6-Post: 2.16 L
FEV6-Pre: 2.09 L
FEV6FVC-%Change-Post: 0 %
FEV6FVC-%Pred-Post: 105 %
FEV6FVC-%Pred-Pre: 106 %
FVC-%Change-Post: 3 %
FVC-%Pred-Post: 87 %
FVC-%Pred-Pre: 84 %
FVC-Post: 2.18 L
FVC-Pre: 2.1 L
Post FEV1/FVC ratio: 78 %
Post FEV6/FVC ratio: 100 %
Pre FEV1/FVC ratio: 78 %
Pre FEV6/FVC Ratio: 100 %
RV % pred: 90 %
RV: 2.23 L
TLC % pred: 86 %
TLC: 4.36 L

## 2023-10-17 NOTE — Patient Instructions (Signed)
 Full pft performed today

## 2023-10-17 NOTE — Progress Notes (Signed)
 Full pft performed today

## 2023-10-18 ENCOUNTER — Encounter: Payer: Self-pay | Admitting: Nurse Practitioner

## 2023-10-18 ENCOUNTER — Ambulatory Visit: Admitting: Nurse Practitioner

## 2023-10-18 VITALS — BP 134/76 | HR 74 | Temp 97.8°F | Ht 64.0 in | Wt 193.0 lb

## 2023-10-18 DIAGNOSIS — M1712 Unilateral primary osteoarthritis, left knee: Secondary | ICD-10-CM

## 2023-10-18 DIAGNOSIS — E782 Mixed hyperlipidemia: Secondary | ICD-10-CM

## 2023-10-18 DIAGNOSIS — E785 Hyperlipidemia, unspecified: Secondary | ICD-10-CM

## 2023-10-18 DIAGNOSIS — Z23 Encounter for immunization: Secondary | ICD-10-CM

## 2023-10-18 DIAGNOSIS — I10 Essential (primary) hypertension: Secondary | ICD-10-CM

## 2023-10-18 DIAGNOSIS — E1169 Type 2 diabetes mellitus with other specified complication: Secondary | ICD-10-CM

## 2023-10-18 LAB — POCT GLYCOSYLATED HEMOGLOBIN (HGB A1C): Hemoglobin A1C: 6.7 % — AB (ref 4.0–5.6)

## 2023-10-18 LAB — MICROALBUMIN / CREATININE URINE RATIO
Creatinine,U: 254.2 mg/dL
Microalb Creat Ratio: 70.3 mg/g — ABNORMAL HIGH (ref 0.0–30.0)
Microalb, Ur: 17.9 mg/dL — ABNORMAL HIGH (ref 0.0–1.9)

## 2023-10-18 NOTE — Assessment & Plan Note (Signed)
 BP at goal with lisinopril  BP Readings from Last 3 Encounters:  10/18/23 134/76  10/11/23 136/82  10/02/23 133/67   Reviewed CMP-completed by oncology-stable Maintain med dose F/up in 3months

## 2023-10-18 NOTE — Progress Notes (Unsigned)
 Patient Care Team: Nche, Roselie Rockford, NP as PCP - General (Internal Medicine) Odean Potts, MD as Consulting Physician (Hematology and Oncology) Rosemarie Eather RAMAN, MD as Consulting Physician (Neurology) Lanis Pupa, MD as Consulting Physician (Neurosurgery) Charmayne Molly, MD as Consulting Physician (Ophthalmology) Lanny Callander, MD as Consulting Physician (Hematology and Oncology) Debby Hila, MD as Consulting Physician (General Surgery)  Clinic Day:  10/18/2023  Referring physician: [name], NP  ASSESSMENT & PLAN:   Assessment & Plan: Cancer of left colon (HCC) pT3N0M0, Stage II, MSI-High -diagnosed on 04/23/20 by Colonoscopy performed for heme-positive stool and anemia. -resection with Dr Debby on 05/28/20 showed 9.7cm mass in splenic flexure with invasive adenocarcinoma, poorly differentiated, negative margins, negative LNs.  -GuardantReveal on 07/07/20 was positive -staging CT CAP w/o contrast on 08/31/20 showed NED. -She began Xeloda  and Keytruda  on 09/21/20. Xeloda  held due to increased Cr and then low EGFR from 12/04/20 - 02/09/21, and Keytruda  was held due to taste change/mouth numbness from 12/25/20 - 02/09/21. Completed Xeloda  in 04/2021 and Keytruda  on 09/17/21. -restaging CT from 09/06/2022, which showed no evidence of cancer recurrence, I personally reviewed the images with her  -restaging CT from 04/04/2023 showed Interval increase in size of the 11 mm posterior left upper lobe pulmonary nodule with a new 4 mm right lower lobe pulmonary nodule -repeated CT chest 08/08/2023 showed increased LUL lung nodule from 1.3 to 2cm,  will obtain PET and GuardantReveal for further evaluation  --Guardant Reveal test was negative for ctDNA --bronchoscopy on 10/02/2023 - FNA of lung nodule was positive for adenocarcinoma. Lavage positive for atypical cells. She is scheduled for PET scan and MRI of the brain on 10/27/2023.     The patient understands the plans discussed today and is  in agreement with them.  She knows to contact our office if she develops concerns prior to her next appointment.  I provided *** minutes of face-to-face time during this encounter and > 50% was spent counseling as documented under my assessment and plan.    Powell FORBES Lessen, NP  [facility] - A DEPT OF [facility] [address] [place] [address] Dept: [phone] Dept Fax: [phone]   No orders of the defined types were placed in this encounter.     CHIEF COMPLAINT:  CC: Cancer of left colon; left lung adenocarcinoma   Current Treatment:  ***  INTERVAL HISTORY:  Briana Ochoa is here today for repeat clinical assessment. She last saw Dr. Lanny on 09/21/2023. She has since had bronchoscopy. Fine needle aspiration of the lung nodule was positive for adenocarcinoma. Bronchial lavage was positive for atypical cells. She is scheduled for PET scan and MRI of the brain on 10/27/2023. She denies fevers or chills. She denies pain. Her appetite is good. Her weight {Weight change:10426}.  I have reviewed the past medical history, past surgical history, social history and family history with the patient and they are unchanged from previous note.  ALLERGIES:  is allergic to feraheme  [ferumoxytol ].  MEDICATIONS:  Current Outpatient Medications  Medication Sig Dispense Refill   acetaminophen  (TYLENOL ) 500 MG tablet Take 1,000 mg by mouth daily.     aspirin  EC 81 MG tablet Take 81 mg by mouth daily. Swallow whole.     Cyanocobalamin  (VITAMIN B 12 PO) Take 1 tablet by mouth 3 (three) times a week. Take 1 tablet by mouth 3 times a week. Take on Monday, Wednesday and Friday  glucose blood (TRUE METRIX BLOOD GLUCOSE TEST) test strip Use as instructed to test blood sugar once daily E08.638 100 each 2   lisinopril  (ZESTRIL ) 20 MG tablet Take 1 tablet (20 mg total) by mouth daily. 90 tablet 3   pravastatin  (PRAVACHOL ) 20 MG tablet Take 1  tablet (20 mg total) by mouth every evening. 90 tablet 1   No current facility-administered medications for this visit.    HISTORY OF PRESENT ILLNESS:   Oncology History Overview Note  Cancer Staging Cancer of left colon [facility]) Staging form: Colon and Rectum, AJCC 8th Edition - Pathologic stage from 05/28/2020: Stage IIA (pT3, pN0, cM0) - Signed by Lanny Callander, MD on 06/19/2020 Stage prefix: Initial diagnosis Total positive nodes: 0 Histologic grading system: 4 grade system Histologic grade (G): G3 Residual tumor (R): R0 - None  Ductal carcinoma in situ (DCIS) of left breast Staging form: Breast, AJCC 7th Edition - Clinical: Stage 0 (Tis (DCIS), N0, cM0) - Unsigned Specimen type: Core Needle Biopsy Histopathologic type: 9932 Laterality: Left Staging comments: Staged at breast conference 7.22.15  - Pathologic: Stage 0 (Tis (DCIS)(2), N0, cM0) - Unsigned Specimen type: Core Needle Biopsy Histopathologic type: 9932 Laterality: Left Tumor size (mm): 3 Multiple tumors: Yes Number of tumors: 2 Method of lymph node assessment: Clinical Method of detection of distant metastases: Clinical Residual tumor (R): R0 - None Estrogen receptor status: Positive Progesterone receptor status: Positive    Ductal carcinoma in situ (DCIS) of left breast  08/20/2013 Initial Diagnosis   Left breast 12:00: DCIS with calcifications, yet 100%, PR 100%; 09/04/2013 second group of calcifications also biopsied proven to be DCIS ER/PR positive   09/19/2013 Surgery   Left breast lumpectomy DCIS 2 foci margins negative, 0.2 and 0.3 cm ER 100% PR 100%   10/08/2013 - 11/12/2013 Radiation Therapy   Adjuvant radiation therapy   11/12/2013 - 2020 Anti-estrogen oral therapy   Anastrozole  1 mg daily plan is for 5 years   01/09/2014 - 01/10/2014 Hospital Admission   Pipeline 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 The report date is 07/30/2020.   The CancerNext-Expanded + RNAinsight gene panel offered by [employer] and includes sequencing and rearrangement analysis for the following 77 genes: AIP, ALK, APC, ATM, AXIN2, BAP1, BARD1, BLM, BMPR1A, BRCA1, BRCA2, BRIP1, CDC73, CDH1, CDK4, CDKN1B, CDKN2A, CHEK2, CTNNA1, DICER1, FANCC, FH, FLCN, GALNT12, KIF1B, LZTR1, MAX, MEN1, MET, MLH1, MSH2, MSH3, MSH6, MUTYH, NBN, NF1, NF2, NTHL1, PALB2, PHOX2B, PMS2, POT1, PRKAR1A, PTCH1, PTEN, RAD51C, RAD51D, RB1, RECQL, RET, SDHA, SDHAF2, SDHB, SDHC, SDHD, SMAD4, SMARCA4, SMARCB1, SMARCE1, STK11, SUFU, TMEM127, TP53, TSC1, TSC2, VHL and XRCC2 (sequencing and deletion/duplication); EGFR, EGLN1, HOXB13, KIT, MITF, PDGFRA, POLD1 and POLE (sequencing only); EPCAM and GREM1 (deletion/duplication only). RNA data is routinely analyzed for use in variant interpretation for all genes.   Bladder cancer (HCC)  02/09/2018 Initial Diagnosis   Bladder cancer (HCC)   07/30/2020 Genetic Testing   Negative genetic testing:  No pathogenic variants detected on the [employer] CancerNext-Expanded + RNAinsight panel. The report date is 07/30/2020.   The CancerNext-Expanded + RNAinsight gene panel offered by [employer] and includes sequencing and rearrangement analysis for the following 77 genes: AIP, ALK, APC, ATM, AXIN2, BAP1, BARD1, BLM, BMPR1A, BRCA1, BRCA2, BRIP1, CDC73, CDH1, CDK4, CDKN1B, CDKN2A, CHEK2, CTNNA1, DICER1, FANCC, FH, FLCN, GALNT12, KIF1B, LZTR1, MAX, MEN1, MET, MLH1, MSH2, MSH3, MSH6,  MUTYH, NBN, NF1, NF2, NTHL1, PALB2, PHOX2B, PMS2, POT1, PRKAR1A, PTCH1, PTEN, RAD51C, RAD51D, RB1, RECQL, RET, SDHA, SDHAF2, SDHB, SDHC, SDHD, SMAD4, SMARCA4, SMARCB1, SMARCE1, STK11, SUFU, TMEM127, TP53, TSC1, TSC2, VHL and XRCC2 (sequencing and deletion/duplication); EGFR, EGLN1, HOXB13, KIT, MITF, PDGFRA, POLD1 and POLE (sequencing only); EPCAM and GREM1 (deletion/duplication only). RNA data is routinely analyzed  for use in variant interpretation for all genes.   08/31/2020 Imaging   CT CAP w/o contrast  IMPRESSION: 1. No specific findings of active malignancy on today's noncontrast CT. There has been interval partial colectomy involving the tumor site with reanastomosis. Some faint stranding in the adjacent omentum is probably from scarring or fat necrosis, less likely due to early tumor given the lack of overt nodularity. Surveillance imaging is likely indicated, and PET-CT may have a role in imaging follow up. 2. Several small pulmonary nodules, in addition to a larger mixed density nodule in the left upper lobe. Surveillance is recommended. 3. Other imaging findings of potential clinical significance: Aortic Atherosclerosis (ICD10-I70.0). Coronary atherosclerosis. Mitral valve calcification. Small type 1 hiatal hernia. Splenectomy with small amount of regenerative splenic tissue as well as a venous varix arising from the splenic vein. Hyperdense left kidney lower pole exophytic homogeneous lesion, probably a complex cyst. Sigmoid colon diverticulosis. Multilevel lumbar spondylosis and degenerative disc disease.   Cancer of left colon (HCC)  04/23/2020 Procedure   Upper Endoscopy and Colonoscopy by Dr Avram IMPRESSION Erythematous mucosa in the antrum. Biopsied. - Small hiatal hernia. - Gastroesophageal flap valve classified as Hill Grade IV (no fold, wide open lumen, hiatal hernia present). - The examination was otherwise normal.   IMPRESSION - Decreased sphincter tone found on digital rectal exam. - Malignant partially obstructing tumor in the proximal descending colon. Biopsied. Tattooed. - Three 5 to 20 mm polyps in the sigmoid colon, removed piecemeal using a cold snare. Resected and retrieved. - Severe diverticulosis in the sigmoid colon. - I left several diminutive descending (proximal to tattoo), sigmoid and rectal polyps - The examination was otherwise normal on direct and  retroflexion views.     04/23/2020 Initial Diagnosis   Diagnosis 1. Descending Colon Polyp, mass - ADENOCARCINOMA. 2. Sigmoid Colon Polyp, (3) - MULTIPLE FRAGMENTS OF TUBULAR ADENOMA. - MULTIPLE FRAGMENTS OF SESSILE SERRATED POLYP WITHOUT DYSPLASIA. - NO HIGH GRADE DYSPLASIA OR MALIGNANCY. Microscopic Comment 1. Dr. Rebbecca has reviewed the case. Dr. Avram was notified on 04/24/2020. 2. Despite being left sided the fragments have features of a sessile serrated polyp, as opposed to, hyperplastic polyp.   04/27/2020 Imaging   CT AP IMPRESSION: Bulky annular soft tissue mass involving the distal transverse colon with extension into adjacent pericolonic fat, consistent with primary colon carcinoma.   No evidence of metastatic disease.   Colonic diverticulosis. No radiographic evidence of diverticulitis.   Aortic Atherosclerosis (ICD10-I70.0).     05/05/2020 Imaging   CT Chest IMPRESSION: Bulky annular soft tissue mass involving the distal transverse colon with extension into adjacent pericolonic fat, consistent with primary colon carcinoma.   No evidence of metastatic disease.   Colonic diverticulosis. No radiographic evidence of diverticulitis.   Aortic Atherosclerosis (ICD10-I70.0).     05/28/2020 Initial Diagnosis   Cancer of left colon (HCC)   05/28/2020 Cancer Staging   Staging form: Colon and Rectum, AJCC 8th Edition - Pathologic stage from 05/28/2020: Stage IIA (pT3, pN0, cM0) - Signed by Lanny Callander, MD on 06/19/2020 Stage prefix: Initial diagnosis Total positive nodes: 0 Histologic grading system: 4 grade system Histologic  grade (G): G3 Residual tumor (R): R0 - None   05/28/2020 Surgery   XI ROBOT ASSISTED RESECTION OF SPLENIC FLEXURE by Dr Debby    FINAL MICROSCOPIC DIAGNOSIS:   A. COLON, SPLENIC FLEXURE, RESECTION:  - Invasive adenocarcinoma, poorly differentiated, spanning 9.7 cm.  - Tumor invades through muscularis propria into pericolonic tissue.  -  Resection margins are negative.  - Tubular adenoma (X1).  - Sessile serrate polyp without dysplasia (x2).  - Diverticulosis.  - Polypectomy scar.  - Twenty-four of twenty-four lymph nodes negative for carcinoma (0/24).  - See oncology table.    07/07/2020 Miscellaneous   Guardant Reveal ctDNA positive MSI high   07/30/2020 Genetic Testing   Negative genetic testing:  No pathogenic variants detected on the [facility] CancerNext-Expanded + RNAinsight panel. The report date is 07/30/2020.   The CancerNext-Expanded + RNAinsight gene panel offered by [employer] and includes sequencing and rearrangement analysis for the following 77 genes: AIP, ALK, APC, ATM, AXIN2, BAP1, BARD1, BLM, BMPR1A, BRCA1, BRCA2, BRIP1, CDC73, CDH1, CDK4, CDKN1B, CDKN2A, CHEK2, CTNNA1, DICER1, FANCC, FH, FLCN, GALNT12, KIF1B, LZTR1, MAX, MEN1, MET, MLH1, MSH2, MSH3, MSH6, MUTYH, NBN, NF1, NF2, NTHL1, PALB2, PHOX2B, PMS2, POT1, PRKAR1A, PTCH1, PTEN, RAD51C, RAD51D, RB1, RECQL, RET, SDHA, SDHAF2, SDHB, SDHC, SDHD, SMAD4, SMARCA4, SMARCB1, SMARCE1, STK11, SUFU, TMEM127, TP53, TSC1, TSC2, VHL and XRCC2 (sequencing and deletion/duplication); EGFR, EGLN1, HOXB13, KIT, MITF, PDGFRA, POLD1 and POLE (sequencing only); EPCAM and GREM1 (deletion/duplication only). RNA data is routinely analyzed for use in variant interpretation for all genes.   08/31/2020 Imaging   CT CAP w/o contrast  IMPRESSION: 1. No specific findings of active malignancy on today's noncontrast CT. There has been interval partial colectomy involving the tumor site with reanastomosis. Some faint stranding in the adjacent omentum is probably from scarring or fat necrosis, less likely due to early tumor given the lack of overt nodularity. Surveillance imaging is likely indicated, and PET-CT may have a role in imaging follow up. 2. Several small pulmonary nodules, in addition to a larger mixed density nodule in the left upper lobe. Surveillance is recommended. 3.  Other imaging findings of potential clinical significance: Aortic Atherosclerosis (ICD10-I70.0). Coronary atherosclerosis. Mitral valve calcification. Small type 1 hiatal hernia. Splenectomy with small amount of regenerative splenic tissue as well as a venous varix arising from the splenic vein. Hyperdense left kidney lower pole exophytic homogeneous lesion, probably a complex cyst. Sigmoid colon diverticulosis. Multilevel lumbar spondylosis and degenerative disc disease.   10/02/2020 - 09/17/2021 Chemotherapy   Patient is on Treatment Plan : COLORECTAL Pembrolizumab  q21d     03/01/2021 Imaging   EXAM: CT CHEST, ABDOMEN AND PELVIS WITHOUT CONTRAST  IMPRESSION: 1. Mixed attenuation lesion posterior left upper lobe has areas of more confluent and enlarging nodularity today. Interval progression raises concern for neoplasm. Consider PET-CT to further evaluate. 2. Stable 7 mm right lower lobe pulmonary nodule with a slightly more conspicuous 3 mm nodule in the right lower lobe. Continued close attention on follow-up recommended. 3. No evidence for metastatic disease in the abdomen or pelvis. 4. Aortic Atherosclerosis (ICD10-I70.0).   03/17/2021 PET scan   IMPRESSION: 1. Part solid nodule of the left upper lobe demonstrates mild FDG uptake, and has increased in size when compared with prior exam dated May 05, 2020. Concerning for indolent primary lung neoplasm. 2. No evidence of metastatic disease in the chest, abdomen or pelvis. 3. 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 Recommend further evaluation with tissue  sampling. 4. Aortic Atherosclerosis (ICD10-I70.0).   03/15/2022 Imaging    IMPRESSION: Stable irregular solid pulmonary nodule in posterior left upper lobe.   No evidence of recurrent or metastatic disease within the abdomen or pelvis.   Colonic diverticulosis, without radiographic evidence of diverticulitis.        REVIEW OF SYSTEMS:   Constitutional: Denies fevers, chills or abnormal weight loss Eyes: Denies blurriness of vision Ears, nose, mouth, throat, and face: Denies mucositis or sore throat Respiratory: Denies cough, dyspnea or wheezes Cardiovascular: Denies palpitation, chest discomfort or lower extremity swelling Gastrointestinal:  Denies nausea, heartburn or change in bowel habits Skin: Denies abnormal skin rashes Lymphatics: Denies new lymphadenopathy or easy bruising Neurological:Denies numbness, tingling or new weaknesses Behavioral/Psych: Mood is stable, no new changes  All other systems were reviewed with the patient and are negative.   VITALS:  There were no vitals taken for this visit.  Wt Readings from Last 3 Encounters:  10/18/23 193 lb (87.5 kg)  10/11/23 195 lb 3.2 oz (88.5 kg)  10/02/23 196 lb (88.9 kg)    There is no height or weight on file to calculate BMI.  Performance status (ECOG): {CHL ONC [id number]  PHYSICAL EXAM:   GENERAL:alert, no distress and comfortable SKIN: skin color, texture, turgor are normal, no rashes or significant lesions EYES: normal, Conjunctiva are pink and non-injected, sclera clear OROPHARYNX:no exudate, no erythema and lips, buccal mucosa, and tongue normal  NECK: supple, thyroid  normal size, non-tender, without nodularity LYMPH:  no palpable lymphadenopathy in the cervical, axillary or inguinal LUNGS: clear to auscultation and percussion with normal breathing effort HEART: regular rate & rhythm and no murmurs and no lower extremity edema ABDOMEN:abdomen soft, non-tender and normal bowel sounds Musculoskeletal:no cyanosis of digits and no clubbing  NEURO: alert & oriented x 3 with fluent speech, no focal motor/sensory deficits  LABORATORY DATA:  I have reviewed the data as listed    Component Value Date/Time   NA 138 10/02/2023 0604   K 4.8 10/02/2023 0604   CL 109 10/02/2023 0604   CO2 23 10/02/2023 0604   GLUCOSE 142  (H) 10/02/2023 0604   BUN 26 (H) 10/02/2023 0604   CREATININE 1.66 (H) 10/02/2023 0604   CREATININE 1.59 (H) 08/22/2023 1527   CALCIUM  9.1 10/02/2023 0604   PROT 6.4 (L) 10/02/2023 0604   ALBUMIN  3.2 (L) 10/02/2023 0604   AST 16 10/02/2023 0604   AST 13 (L) 08/22/2023 1527   ALT 12 10/02/2023 0604   ALT 8 08/22/2023 1527   ALKPHOS 77 10/02/2023 0604   BILITOT 0.6 10/02/2023 0604   BILITOT 0.3 08/22/2023 1527   GFRNONAA 30 (L) 10/02/2023 0604   GFRNONAA 32 (L) 08/22/2023 1527   GFRAA 42 (L) 04/28/2018 0313    No results found for: SPEP, UPEP  Lab Results  Component Value Date   WBC 9.0 10/02/2023   NEUTROABS 6.2 08/22/2023   HGB 15.1 (H) 10/02/2023   HCT 46.3 (H) 10/02/2023   MCV 95.9 10/02/2023   PLT 274 10/02/2023      Chemistry      Component Value Date/Time   NA 138 10/02/2023 0604   K 4.8 10/02/2023 0604   CL 109 10/02/2023 0604   CO2 23 10/02/2023 0604   BUN 26 (H) 10/02/2023 0604   CREATININE 1.66 (H) 10/02/2023 0604   CREATININE 1.59 (H) 08/22/2023 1527      Component Value Date/Time   CALCIUM  9.1 10/02/2023 0604   ALKPHOS 77 10/02/2023 0604  AST 16 10/02/2023 0604   AST 13 (L) 08/22/2023 1527   ALT 12 10/02/2023 0604   ALT 8 08/22/2023 1527   BILITOT 0.6 10/02/2023 0604   BILITOT 0.3 08/22/2023 1527       RADIOGRAPHIC STUDIES: I have personally reviewed the radiological images as listed and agreed with the findings in the report. DG Chest Port 1 View Result Date: 10/02/2023 CLINICAL DATA:  Status post bronchoscopy with biopsy. EXAM: PORTABLE CHEST 1 VIEW COMPARISON:  04/06/2021 and CT chest 08/08/2023. FINDINGS: Patient is slightly rotated. Trachea is midline. Heart is enlarged. Thoracic aorta is calcified. Mild streaky bibasilar atelectasis. Fiducial marker in the left perihilar region. No pneumothorax. Surgical clips in the left breast. IMPRESSION: 1. No pneumothorax status post bronchoscopy and biopsy. 2. Bibasilar streaky atelectasis.  Electronically Signed   By: Newell Eke M.D.   On: 10/02/2023 10:23   DG C-ARM BRONCHOSCOPY Result Date: 10/02/2023 C-ARM BRONCHOSCOPY: Fluoroscopy was utilized by the requesting physician.  No radiographic interpretation.

## 2023-10-18 NOTE — Progress Notes (Signed)
 Established Patient Visit  Patient: Briana Ochoa   DOB: June 03, 1940   83 y.o. Female  MRN: [id number] Visit Date: 10/23/2023  Subjective:    Chief Complaint  Patient presents with   Follow-up    FASTING 3 month follow up for DM, HTN and Hyperlipidemia    HPI OA (osteoarthritis) of knee No improvement with intra articular injection Minimal improvement with tylenol  Reports bloody diarrhea with voltaren  gel. Declined to take any additional prescription pain med at this time Provided a completed and signed Handicap placard form  HTN (hypertension) BP at goal with lisinopril  BP Readings from Last 3 Encounters:  10/18/23 134/76  10/11/23 136/82  10/02/23 133/67   Reviewed CMP-completed by oncology-stable Maintain med dose F/up in 3months  Diabetes mellitus (HCC) Repeat hgbA1c: 6.7% improved Repeat UACr: abnormal Will repeat in 3months  Wt Readings from Last 3 Encounters:  10/19/23 194 lb 6.4 oz (88.2 kg)  10/18/23 193 lb (87.5 kg)  10/11/23 195 lb 3.2 oz (88.5 kg)    Reviewed medical, surgical, and social history today  Medications: Outpatient Medications Prior to Visit  Medication Sig   acetaminophen  (TYLENOL ) 500 MG tablet Take 1,000 mg by mouth daily.   aspirin  EC 81 MG tablet Take 81 mg by mouth daily. Swallow whole.   Cyanocobalamin  (VITAMIN B 12 PO) Take 1 tablet by mouth 3 (three) times a week. Take 1 tablet by mouth 3 times a week. Take on Monday, Wednesday and Friday   glucose blood (TRUE METRIX BLOOD GLUCOSE TEST) test strip Use as instructed to test blood sugar once daily E08.638   lisinopril  (ZESTRIL ) 20 MG tablet Take 1 tablet (20 mg total) by mouth daily.   [DISCONTINUED] pravastatin  (PRAVACHOL ) 20 MG tablet Take 1 tablet (20 mg total) by mouth every evening.   No facility-administered medications prior to visit.   Reviewed past medical and social history.   ROS per HPI above      Objective:  BP 134/76 (BP Location: Left Arm,  Patient Position: Sitting, Cuff Size: Large)   Pulse 74   Temp 97.8 F (36.6 C) (Oral)   Ht 5' 4 (1.626 m)   Wt 193 lb (87.5 kg)   SpO2 95%   BMI 33.13 kg/m      Physical Exam Vitals and nursing note reviewed.  Cardiovascular:     Rate and Rhythm: Normal rate and regular rhythm.     Pulses: Normal pulses.     Heart sounds: Normal heart sounds.  Pulmonary:     Effort: Pulmonary effort is normal.     Breath sounds: Normal breath sounds.  Musculoskeletal:     Right lower leg: No edema.     Left lower leg: No edema.  Neurological:     Mental Status: She is alert and oriented to person, place, and time.     Results for orders placed or performed in visit on 10/18/23  Microalbumin / creatinine urine ratio  Result Value Ref Range   Microalb, Ur 17.9 (H) 0.0 - 1.9 mg/dL   Creatinine,U 745.7 mg/dL   Microalb Creat Ratio 70.3 (H) 0.0 - 30.0 mg/g  POCT glycosylated hemoglobin (Hb A1C)  Result Value Ref Range   Hemoglobin A1C 6.7 (A) 4.0 - 5.6 %   HbA1c POC (<> result, manual entry)     HbA1c, POC (prediabetic range)     HbA1c, POC (controlled diabetic range)  Assessment & Plan:    Problem List Items Addressed This Visit     Diabetes mellitus (HCC) - Primary   Repeat hgbA1c: 6.7% improved Repeat UACr: abnormal Will repeat in 3months      Relevant Medications   pravastatin  (PRAVACHOL ) 20 MG tablet   Other Relevant Orders   Microalbumin / creatinine urine ratio (Completed)   POCT glycosylated hemoglobin (Hb A1C) (Completed)   HTN (hypertension)   BP at goal with lisinopril  BP Readings from Last 3 Encounters:  10/18/23 134/76  10/11/23 136/82  10/02/23 133/67   Reviewed CMP-completed by oncology-stable Maintain med dose F/up in 3months      Relevant Medications   pravastatin  (PRAVACHOL ) 20 MG tablet   Hyperlipidemia associated with type 2 diabetes mellitus (HCC)   Relevant Medications   pravastatin  (PRAVACHOL ) 20 MG tablet   OA (osteoarthritis) of  knee   No improvement with intra articular injection Minimal improvement with tylenol  Reports bloody diarrhea with voltaren  gel. Declined to take any additional prescription pain med at this time Provided a completed and signed Handicap placard form      Other Visit Diagnoses       Need for influenza vaccination       Relevant Orders   Flu vaccine HIGH DOSE PF(Fluzone Trivalent) (Completed)     Mixed hyperlipidemia       Relevant Medications   pravastatin  (PRAVACHOL ) 20 MG tablet      Return in about 3 months (around 01/17/2024) for HTN, DM, hyperlipidemia (fasting).     Roselie Mood, NP

## 2023-10-18 NOTE — Assessment & Plan Note (Signed)
 pT3N0M0, Stage II, MSI-High -diagnosed on 04/23/20 by Colonoscopy performed for heme-positive stool and anemia. -resection with Dr Debby on 05/28/20 showed 9.7cm mass in splenic flexure with invasive adenocarcinoma, poorly differentiated, negative margins, negative LNs.  -GuardantReveal on 07/07/20 was positive -staging CT CAP w/o contrast on 08/31/20 showed NED. -Briana Ochoa began Xeloda  and Keytruda  on 09/21/20. Xeloda  held due to increased Cr and then low EGFR from 12/04/20 - 02/09/21, and Keytruda  was held due to taste change/mouth numbness from 12/25/20 - 02/09/21. Completed Xeloda  in 04/2021 and Keytruda  on 09/17/21. -restaging CT from 09/06/2022, which showed no evidence of cancer recurrence, I personally reviewed the images with her  -restaging CT from 04/04/2023 showed Interval increase in size of the 11 mm posterior left upper lobe pulmonary nodule with a new 4 mm right lower lobe pulmonary nodule -repeated CT chest 08/08/2023 showed increased LUL lung nodule from 1.3 to 2cm,  will obtain PET and GuardantReveal for further evaluation  --Guardant Reveal test was negative for ctDNA --bronchoscopy on 10/02/2023 - FNA of lung nodule was positive for adenocarcinoma. Lavage positive for atypical cells. Briana Ochoa is scheduled for PET scan and MRI of the brain on 10/27/2023.

## 2023-10-18 NOTE — Assessment & Plan Note (Addendum)
 No improvement with intra articular injection Minimal improvement with tylenol  Reports bloody diarrhea with voltaren  gel. Declined to take any additional prescription pain med at this time Provided a completed and signed Handicap placard form

## 2023-10-18 NOTE — Patient Instructions (Addendum)
 hgbA1c at 6.7% No change in medications Maintain a low carb/low sugar diet Go to lab for urine collection.  Diabetes: Healthy Eating for Adults When you have diabetes, also called diabetes mellitus, it's important to have healthy eating habits. Your blood sugar (glucose) levels are greatly affected by what you eat and drink. You need to eat healthy foods in the right amounts, at about the same times each day. Doing this can help you: Manage your blood sugar. Lower your risk of heart disease. Improve your blood pressure. Reach or stay at a healthy weight. What can affect my meal plan? Every person with diabetes is different. And each person has different needs for a meal plan. Your health care provider may suggest that you work with an expert in healthy eating called a dietitian. They can help you make a meal plan that's best for you. How do carbohydrates affect me? Carbohydrates, also called carbs, affect your blood sugar level more than any other type of food. Eating carbs raises the amount of sugar in your blood. It's important to know how many carbs you can safely have in each meal. This is different for every person. Your dietitian can help you calculate how many carbs you should have at each meal and for each snack. How does alcohol affect me? Alcohol can cause a decrease in blood sugar (hypoglycemia), especially if you use insulin  or take certain diabetes medicines by mouth. Hypoglycemia can be a life-threatening condition. Symptoms of hypoglycemia are similar to those of having too much alcohol. They include confusion, being sleepy, and feeling dizzy. Do not drink alcohol if: Your provider tells you not to drink. You're pregnant, may be pregnant, or plan to become pregnant. What are tips for following this plan? Reading food labels Start by checking the serving size on the Nutrition Facts label of packaged foods and drinks. The number of calories and the amount of carbs, fats, and other  nutrients listed on the label are based on one serving of the item. Many items contain more than one serving per package. Check the total grams (g) of carbs in one serving. Check the number of grams of saturated fats and trans fats in one serving. Choose foods that have a low amount or none of these fats. Check the number of milligrams (mg) of salt (sodium) in one serving. Most people should limit their total sodium intake to less than 2,300 mg per day. Always check the nutrition information of foods labeled as low-fat or nonfat. These foods may be higher in added sugar or refined carbs and should be avoided. Talk to your dietitian to identify your daily goals for nutrients listed on the label. Shopping Avoid buying canned, pre-made, or processed foods. These foods tend to be high in fat, sodium, and added sugar. Shop around the outside edge of the grocery store. This is where you'll most often find fresh fruits and vegetables, bulk grains, fresh meats, and fresh dairy products. Cooking Use low-heat cooking methods, such as baking, instead of high-heat methods like deep frying. Cook using healthy oils, such as olive, canola, or sunflower oil. Avoid cooking with butter, cream, or high-fat meats. Meal planning  Eat meals and snacks regularly. Try to eat them at the same times every day. Avoid going too long without eating. Eat foods that are high in fiber, such as fresh fruits, vegetables, beans, and whole grains. Eat 4-6 oz (112-168 g) of lean protein each day, such as lean meat, chicken, fish, eggs, or tofu.  One ounce (oz) (28 g) of lean protein is equal to: 1 oz (28 g) of meat, chicken, or fish. 1 egg.  cup (62 g) of tofu. Eat some foods each day that contain healthy fats, such as avocado, nuts, seeds, and fish. What foods should I eat? Fruits Berries. Apples. Oranges. Peaches. Apricots. Plums. Grapes. Mangoes. Papayas. Pomegranates. Kiwi. Cherries. Vegetables Leafy greens, including  lettuce, spinach, kale, chard, collard greens, mustard greens, and cabbage. Beets. Cauliflower. Broccoli. Carrots. Green beans. Tomatoes. Peppers. Onions. Cucumbers. Brussels sprouts. Grains Whole grains, such as whole-wheat or whole-grain bread, crackers, tortillas, cereal, and pasta. Unsweetened oatmeal. Quinoa. Brown or wild rice. Meats and other proteins Seafood. Poultry without skin. Lean cuts of poultry and beef. Tofu. Nuts. Seeds. Dairy Low-fat or fat-free dairy products such as milk, yogurt, and cheese. The items listed above may not be all the foods and drinks you can have. Talk with a dietitian to learn more. What foods should I avoid? Fruits Fruits canned with syrup. Vegetables Canned vegetables. Frozen vegetables with butter or cream sauce. Grains Refined white flour and flour products such as bread, pasta, snack foods, and cereals. Avoid all processed foods. Meats and other proteins Fatty cuts of meat. Poultry with skin. Breaded or fried meats. Processed meat. Avoid saturated fats. Dairy Full-fat yogurt, cheese, or milk. Beverages Sweetened drinks, such as soda or iced tea. The items listed above may not be all the foods and drinks you should avoid. Talk with a dietitian to learn more. Where to find more information: To learn more, go to: Academy of Nutrition and Dietetics at DeathPrevention.it. Click Search and type diabetes. Find the link you need. Centers for Disease Control and Prevention at TonerPromos.no. Click Search and type diabetes. Find the link you need. American Diabetes Association: diabetes.org/food-nutrition [employer] of Diabetes and Digestive and Kidney Diseases: StageSync.si This information is not intended to replace advice given to you by your health care provider. Make sure you discuss any questions you have with your health care provider. Document Revised: 01/12/2023 Document Reviewed: 01/12/2023 Elsevier Patient Education  2025 [employer].

## 2023-10-18 NOTE — Assessment & Plan Note (Addendum)
 Repeat hgbA1c: 6.7% improved Repeat UACr: abnormal Will repeat in 3months

## 2023-10-19 ENCOUNTER — Other Ambulatory Visit: Payer: Self-pay

## 2023-10-19 ENCOUNTER — Other Ambulatory Visit: Payer: Self-pay | Admitting: Hematology

## 2023-10-19 ENCOUNTER — Ambulatory Visit: Attending: Hematology | Admitting: Nurse Practitioner

## 2023-10-19 VITALS — BP 162/80 | HR 66 | Temp 97.3°F | Resp 13 | Wt 194.4 lb

## 2023-10-19 DIAGNOSIS — Z800 Family history of malignant neoplasm of digestive organs: Secondary | ICD-10-CM | POA: Diagnosis not present

## 2023-10-19 DIAGNOSIS — Z87891 Personal history of nicotine dependence: Secondary | ICD-10-CM | POA: Diagnosis not present

## 2023-10-19 DIAGNOSIS — Z803 Family history of malignant neoplasm of breast: Secondary | ICD-10-CM | POA: Diagnosis not present

## 2023-10-19 DIAGNOSIS — M898X8 Other specified disorders of bone, other site: Secondary | ICD-10-CM | POA: Insufficient documentation

## 2023-10-19 DIAGNOSIS — Z8551 Personal history of malignant neoplasm of bladder: Secondary | ICD-10-CM | POA: Diagnosis not present

## 2023-10-19 DIAGNOSIS — Z8051 Family history of malignant neoplasm of kidney: Secondary | ICD-10-CM | POA: Insufficient documentation

## 2023-10-19 DIAGNOSIS — C3432 Malignant neoplasm of lower lobe, left bronchus or lung: Secondary | ICD-10-CM

## 2023-10-19 DIAGNOSIS — Z9221 Personal history of antineoplastic chemotherapy: Secondary | ICD-10-CM | POA: Diagnosis not present

## 2023-10-19 DIAGNOSIS — Z86000 Personal history of in-situ neoplasm of breast: Secondary | ICD-10-CM | POA: Diagnosis not present

## 2023-10-19 DIAGNOSIS — C186 Malignant neoplasm of descending colon: Secondary | ICD-10-CM | POA: Diagnosis not present

## 2023-10-19 DIAGNOSIS — Z8049 Family history of malignant neoplasm of other genital organs: Secondary | ICD-10-CM | POA: Insufficient documentation

## 2023-10-19 DIAGNOSIS — Z923 Personal history of irradiation: Secondary | ICD-10-CM | POA: Diagnosis not present

## 2023-10-19 DIAGNOSIS — C7801 Secondary malignant neoplasm of right lung: Secondary | ICD-10-CM | POA: Insufficient documentation

## 2023-10-20 ENCOUNTER — Encounter: Payer: Self-pay | Admitting: Hematology

## 2023-10-20 NOTE — Progress Notes (Signed)
 The proposed treatment discussed in conference is for discussion purpose only and is not a binding recommendation.  The patients have not been physically examined, or presented with their treatment options.  Therefore, final treatment plans cannot be decided.

## 2023-10-23 ENCOUNTER — Ambulatory Visit: Payer: Self-pay | Admitting: Nurse Practitioner

## 2023-10-23 ENCOUNTER — Encounter: Payer: Self-pay | Admitting: Nurse Practitioner

## 2023-10-23 LAB — CYTOLOGY - NON PAP

## 2023-10-23 MED ORDER — PRAVASTATIN SODIUM 20 MG PO TABS: 20.0000 mg | ORAL_TABLET | Freq: Every evening | ORAL | 1 refills | Status: AC

## 2023-10-26 ENCOUNTER — Other Ambulatory Visit: Payer: Self-pay

## 2023-10-27 ENCOUNTER — Encounter (HOSPITAL_COMMUNITY)
Admission: RE | Admit: 2023-10-27 | Discharge: 2023-10-27 | Disposition: A | Discharge status: Home / Self Care | Source: Ambulatory Visit | Attending: Acute Care | Admitting: Acute Care

## 2023-10-27 ENCOUNTER — Ambulatory Visit (HOSPITAL_COMMUNITY)
Admission: RE | Admit: 2023-10-27 | Discharge: 2023-10-27 | Disposition: A | Discharge status: Home / Self Care | Source: Ambulatory Visit | Attending: Acute Care | Admitting: Acute Care

## 2023-10-27 DIAGNOSIS — C3412 Malignant neoplasm of upper lobe, left bronchus or lung: Secondary | ICD-10-CM | POA: Insufficient documentation

## 2023-10-27 DIAGNOSIS — C3490 Malignant neoplasm of unspecified part of unspecified bronchus or lung: Secondary | ICD-10-CM | POA: Diagnosis not present

## 2023-10-27 DIAGNOSIS — C186 Malignant neoplasm of descending colon: Secondary | ICD-10-CM | POA: Diagnosis not present

## 2023-10-27 DIAGNOSIS — I671 Cerebral aneurysm, nonruptured: Secondary | ICD-10-CM | POA: Diagnosis not present

## 2023-10-27 DIAGNOSIS — I6782 Cerebral ischemia: Secondary | ICD-10-CM | POA: Diagnosis not present

## 2023-10-27 DIAGNOSIS — G319 Degenerative disease of nervous system, unspecified: Secondary | ICD-10-CM | POA: Diagnosis not present

## 2023-10-27 LAB — GLUCOSE, CAPILLARY: Glucose-Capillary: 124 mg/dL — ABNORMAL HIGH (ref 70–99)

## 2023-10-27 MED ORDER — GADOBUTROL 1 MMOL/ML IV SOLN
9.0000 mL | Freq: Once | INTRAVENOUS | Status: AC | PRN
Start: 2023-10-27 — End: 2023-10-27
  Administered 2023-10-27: 9 mL via INTRAVENOUS

## 2023-10-27 MED ORDER — FLUDEOXYGLUCOSE F - 18 (FDG) INJECTION
9.7500 | Freq: Once | INTRAVENOUS | Status: AC | PRN
  Administered 2023-10-27: 9.75 via INTRAVENOUS

## 2023-10-31 ENCOUNTER — Ambulatory Visit
Attending: Thoracic Surgery (Cardiothoracic Vascular Surgery) | Admitting: Thoracic Surgery (Cardiothoracic Vascular Surgery)

## 2023-10-31 VITALS — BP 156/89 | HR 71 | Resp 18 | Ht 64.0 in | Wt 195.0 lb

## 2023-10-31 DIAGNOSIS — C3490 Malignant neoplasm of unspecified part of unspecified bronchus or lung: Secondary | ICD-10-CM | POA: Diagnosis not present

## 2023-10-31 NOTE — Progress Notes (Signed)
 PCP is Nche, Roselie Rockford, NP Referring Provider is Lanny Callander, MD  Chief Complaint  Patient presents with   Lung Lesion    Surgical eval    HPI: Briana Ochoa sent for consultation regarding an adenocarcinoma of the left upper lobe.  Briana Ochoa is an 83 year old woman with a remote history of tobacco use (60 pack years, quit 20 years ago), breast cancer, colon cancer, bladder cancer, skin cancer, hypertension hyperlipidemia, aortic aneurysm repair, type 2 diabetes, stage III chronic kidney disease, and newly diagnosed adenocarcinoma in the left upper lobe of the lung.  Has had a lung nodule for a couple of years.  1 point had gotten a little larger and had a bronchoscopy which was nondiagnostic.  More recently it had increased in size from 1.3 to 2 cm.  Underwent a another robotic bronchoscopy and biopsy showed adenocarcinoma.  Recently had a PET which showed activity in the nodule as well as a hilar node, the right scapula, and her colonic anastomosis.    Past Medical History:  Diagnosis Date   Allergy    seasonal allergies   Anemia    on meds   Aortic atherosclerosis    Bladder cancer (HCC) 2020   Bladder tumor    Bloody diarrhea 09/29/2017   Breast cancer (HCC) 2015   Left Breast Cancer   Cataract    bilateral -sx   CKD (chronic kidney disease), stage III (HCC)    DM related   Colon cancer (HCC)    Diabetes mellitus type 2, diet-controlled (HCC)    diet controlled   Dyspnea 09/28/2017   Dysuria    Family history of adverse reaction to anesthesia    aunt- N/V    Family history of breast cancer    Family history of kidney cancer    Family history of throat cancer    Family history of uterine cancer    Fatigue 09/28/2017   Frequency of urination    Grade I diastolic dysfunction 08/06/2014   Noted on ECHO   Gross hematuria 09/29/2017   Heart murmur    since rheumatic fever as child   Hematuria    History of cardiomegaly 02/12/2005   Mild, noted on CXR    History of CVA (cerebrovascular accident) 08/05/2014   post op cerebral angiogram, cerebral thrombosis w/ cerebral infarction;  11-02-2017  per pt no residuals   History of malignant melanoma of skin 12/2015   s/p wide local excision right lower leg (per pt localized)   History of rheumatic fever as a child    Hyperlipidemia    on meds   Hypertension    on meds   Intracranial aneurysm dx 07/ 2015   paraophthalmic RICA aneurysm /   s/p  Pipeline embolization right ICA 01-09-2014   Jaundice 09/28/2017   Malignant neoplasm of central portion of left breast in female, estrogen receptor positive [facility]) oncologist-  dr odean   dx 07/ 2015--- DCIS, ER/PR positive-- s/p  left breast lumpectomy 09-19-2013,  completed radiation 11-12-2013,  started arimidex  11-12-2013   Melanoma (HCC)    righ tleg    OA (osteoarthritis)    knees, hands, R shoulder   Osteopenia 10/16/2012   Diffuse   Personal history of radiation therapy    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   Sigmoid diverticulosis    Weakness 09/28/2017   Wears dentures    upper   Wears glasses    Wears hearing aid in both ears  Past Surgical History:  Procedure Laterality Date   BREAST LUMPECTOMY Left 2015   BREAST LUMPECTOMY WITH NEEDLE LOCALIZATION Left 09/19/2013   Procedure: LEFT BREAST LUMPECTOMY WITH DOUBLE WIRE BRACKETED  NEEDLE LOCALIZATION;  Surgeon: Elon CHRISTELLA Pacini, MD;  Location: [facility] OR;  Service: General;  Laterality: Left;   BRONCHIAL BIOPSY  04/06/2021   Procedure: BRONCHIAL BIOPSIES;  Surgeon: Brenna Adine CROME, DO;  Location: [facility];  Service: Pulmonary;;   BRONCHIAL BRUSHINGS  04/06/2021   Procedure: BRONCHIAL BRUSHINGS;  Surgeon: Brenna Adine CROME, DO;  Location: [facility];  Service: Pulmonary;;   CATARACT EXTRACTION W/ INTRAOCULAR LENS  IMPLANT, BILATERAL  06/2015   CESAREAN SECTION  1964   CHOLECYSTECTOMY OPEN  1970s   CYSTOSCOPY W/ URETERAL STENT PLACEMENT Left 11/07/2017   Procedure:  CYSTOSCOPY WITH RETROGRADE PYELOGRAM/URETERAL STENT PLACEMENT;  Surgeon: Sherrilee Belvie CROME, MD;  Location: [facility];  Service: Urology;  Laterality: Left;   CYSTOSCOPY W/ URETERAL STENT PLACEMENT Bilateral 04/27/2018   Procedure: CYSTOSCOPY WITH RETROGRADE PYELOGRAM;  Surgeon: Sherrilee Belvie CROME, MD;  Location: [facility];  Service: Urology;  Laterality: Bilateral;   FINGER SURGERY     lt thumb  from dog bite   IR  NEPHROURETERAL CATH PLACE LEFT  04/28/2018   MELANOMA EXCISION Right 12/28/2015   Procedure: EXCISION MELANOMA RIGHT LOWER LEG;  Surgeon: Elon Pacini, MD;  Location: [facility];  Service: General;  Laterality: Right;   RADIOLOGY WITH ANESTHESIA N/A 01/09/2014   Procedure: Embolization;  Surgeon: Gerldine Maizes, MD;  Location: [facility] OR;  Service: Radiology;  Laterality: N/A;   SPLENECTOMY, TOTAL  2009 approx.   splen injury due to fall off horse   TONSILLECTOMY  child   TRANSURETHRAL RESECTION OF BLADDER TUMOR N/A 11/07/2017   Procedure: TRANSURETHRAL RESECTION OF BLADDER TUMOR (TURBT), POSSIBLE STENT PLACEMENT;  Surgeon: Sherrilee Belvie CROME, MD;  Location: [facility];  Service: Urology;  Laterality: N/A;   TRANSURETHRAL RESECTION OF BLADDER TUMOR N/A 12/14/2017   Procedure: TRANSURETHRAL RESECTION OF BLADDER TUMOR (TURBT);  Surgeon: Sherrilee Belvie CROME, MD;  Location: [facility];  Service: Urology;  Laterality: N/A;  30 MINS   TRANSURETHRAL RESECTION OF BLADDER TUMOR N/A 04/27/2018   Procedure: TRANSURETHRAL RESECTION OF BLADDER TUMOR (TURBT);  Surgeon: Sherrilee Belvie CROME, MD;  Location: [facility];  Service: Urology;  Laterality: N/A;  30 MINS   VIDEO BRONCHOSCOPY WITH ENDOBRONCHIAL NAVIGATION Bilateral 10/02/2023   Procedure: VIDEO BRONCHOSCOPY WITH ENDOBRONCHIAL NAVIGATION;  Surgeon: Shelah Lamar RAMAN, MD;  Location: [facility] END[facility]COPY;  Service: Pulmonary;  Laterality: Bilateral;   VIDEO  BRONCHOSCOPY WITH RADIAL ENDOBRONCHIAL ULTRASOUND  04/06/2021   Procedure: RADIAL ENDOBRONCHIAL ULTRASOUND;  Surgeon: Brenna Adine CROME, DO;  Location: [facility];  Service: Pulmonary;;    Family History  Problem Relation Age of Onset   Hypertension Mother    Kidney cancer Mother        dx early 51s   Heart attack Paternal Grandfather    Throat cancer Brother 64       hx smoking   Head & neck cancer Son 70       jaw cancer   Breast cancer Maternal Aunt 71   Breast cancer Paternal Aunt 32   Uterine cancer Cousin        dx early 43s (maternal first cousin)   Colon polyps Neg Hx    Colon cancer Neg Hx    Esophageal cancer Neg Hx    Rectal  cancer Neg Hx    Stomach cancer Neg Hx     Social History Social History   Tobacco Use   Smoking status: Former    Current packs/day: 0.00    Average packs/day: 1.5 packs/day for 40.0 years (60.0 ttl pk-yrs)    Types: Cigarettes    Start date: 03/06/1967    Quit date: 03/06/2007    Years since quitting: 16.6    Passive exposure: Past   Smokeless tobacco: Never  Vaping Use   Vaping status: Never Used  Substance Use Topics   Alcohol use: No   Drug use: Never    Current Outpatient Medications  Medication Sig Dispense Refill   acetaminophen  (TYLENOL ) 500 MG tablet Take 1,000 mg by mouth daily.     aspirin  EC 81 MG tablet Take 81 mg by mouth daily. Swallow whole.     Cyanocobalamin  (VITAMIN B 12 PO) Take 1 tablet by mouth 3 (three) times a week. Take 1 tablet by mouth 3 times a week. Take on Monday, Wednesday and Friday     glucose blood (TRUE METRIX BLOOD GLUCOSE TEST) test strip Use as instructed to test blood sugar once daily E08.638 100 each 2   lisinopril  (ZESTRIL ) 20 MG tablet Take 1 tablet (20 mg total) by mouth daily. 90 tablet 3   pravastatin  (PRAVACHOL ) 20 MG tablet Take 1 tablet (20 mg total) by mouth every evening. 90 tablet 1   No current facility-administered medications for this visit.    Allergies  Allergen Reactions    Feraheme  [Ferumoxytol ] Shortness Of Breath and Palpitations    Flushed face; palpitations, SOB    Review of Systems  Constitutional:  Negative for activity change and unexpected weight change.  HENT:  Positive for hearing loss.   Respiratory:  Positive for cough and shortness of breath.   Gastrointestinal:  Negative for abdominal pain and blood in stool.  Genitourinary:  Positive for frequency.  Skin:        Itching  Hematological:  Bruises/bleeds easily.  All other systems reviewed and are negative.   BP (!) 156/89 (BP Location: Left Arm)   Pulse 71   Resp 18   Ht 5' 4 (1.626 m)   Wt 195 lb (88.5 kg)   SpO2 94%   BMI 33.47 kg/m  Physical Exam Vitals reviewed.  Constitutional:      Appearance: She is obese.  HENT:     Head: Normocephalic and atraumatic.  Eyes:     General: No scleral icterus.    Extraocular Movements: Extraocular movements intact.  Cardiovascular:     Rate and Rhythm: Normal rate and regular rhythm.     Heart sounds: Murmur (2/6 systolic right upper sternal border) heard.  Pulmonary:     Effort: Pulmonary effort is normal. No respiratory distress.     Breath sounds: Normal breath sounds. No wheezing.  Abdominal:     General: There is no distension.     Palpations: Abdomen is soft.  Lymphadenopathy:     Cervical: No cervical adenopathy.  Skin:    General: Skin is warm and dry.  Neurological:     General: No focal deficit present.     Mental Status: She is alert and oriented to person, place, and time.     Cranial Nerves: No cranial nerve deficit.    Diagnostic Tests: NUCLEAR MEDICINE PET SKULL BASE TO THIGH   TECHNIQUE: 9.75 mCi F-18 FDG was injected intravenously. Full-ring PET imaging was performed from the skull base to thigh  after the radiotracer. CT data was obtained and used for attenuation correction and anatomic localization.   Fasting blood glucose: 124 mg/dl   COMPARISON:  PET-CT March 17, 2021, chest CT August 08, 2023    FINDINGS: Mediastinal blood pool activity: SUV max 2.8   Liver activity: SUV max 3.8   NECK: Hypermetabolic right parotid subcentimeter lesion with max SUV 6.4 likely a Warthin's tumor, stable to prior. No new lymphadenopathy.   Incidental CT findings: Calcified ICA aneurysm in paraclinoid.   CHEST: Hypermetabolic mass in posterior aspect of left upper lobe is increased in size and metabolic activity measuring 2 x 1.7 cm with max SUV 9.5 versus previously 2 x 1.3 cm on prior CT and previously 1.4 cm on PET-CT max SUV 2.5. (Image 68).   Additional scattered micro nodules are stable and below PET resolution. Previously seen 7 mm nodule along the subpleural right lower lobe is less conspicuous on current non breath hold exam.   Left hilar hypermetabolic lymph node with max SUV 5.2, new to prior. Remainder of the subcentimeter and pericentimeter calcified and noncalcified mediastinal lymph nodes are stable.   Incidental CT findings: Postlumpectomy changes of left breast without suspicious finding to suggest recurrent malignancy. Right breast is normal. No axillary lymphadenopathy. Cardiomegaly, severe atherosclerotic calcifications of coronary arteries. Hiatal hernia.   ABDOMEN/PELVIS: Interval development of a new focal activity measuring 2 x 1 cm with max SUV 8.3 at surgical colonic anastomosis in left upper quadrant (image 143) concerning for recurrent malignancy. Physiologic activity may contribute to this activity. Colonic diverticulosis within the remainder of the colon.   No suspicious lymphadenopathy.   Bladder is under distended. Limited evaluation of the bladder on PET-CT given the tracer activity within the lumen.   Incidental CT findings: Splenectomy. Round non FDG avid nodule adjacent to pancreatic tail may represent a small splenule measuring 2.4 cm. Uterus and bilateral ovaries appear unremarkable. Focal activity at the vaginal introitus likely due to  urinary contamination.   SKELETON: New right scapular osseous metastasis without definite CT correlate (image 78). Max SUV 5.2.   IMPRESSION: Interval increase in size and metabolic activity of the left upper lobe known malignancy. In the absence of known new immunotherapy treatment findings are concerning for progressive disease.   New left hilar FDG avid uptake concerning for metastatic nodal disease.   New osseous metastasis involving the right scapula.   New focal activity at the site of surgical anastomosis of the colon concerning for recurrent malignancy. Recommend colonoscopy for further assessment.   Stable right parotid FDG avid lesion most consistent with a Warthin's tumor.     Electronically Signed   By: Megan  Zare M.D.   On: 10/27/2023 13:26 I personally reviewed the PET/CT images.  Hypermetabolic left upper lobe nodule with left hilar adenopathy.  No mediastinal adenopathy.  Focus of activity at the tip of the right scapula.  Radiologist also noted activity at her colon anastomosis.  Extensive aortic and coronary atherosclerosis.  Impression: Briana Ochoa is an 83 year old woman with a remote history of tobacco use (60 pack years, quit 20 years ago), breast cancer, colon cancer, bladder cancer, skin cancer, hypertension hyperlipidemia, aortic aneurysm repair, type 2 diabetes, stage III chronic kidney disease, and newly diagnosed adenocarcinoma in the left upper lobe of the lung.  Adenocarcinoma of the lung-likely a primary rather than metastatic colon.  Initially thought to be stage I. However, she does have hypermetabolic hilar adenopathy which would make it stage IIb disease.  Even  more concerning is the hypermetabolic activity at the tip of the right scapula which likely represents a bone metastasis.  Given her advanced age and multiple other issues, I do not think that she is a good candidate for surgical resection.  It was isolated stage I disease it would be  another consideration but with stage IIb she would need neoadjuvant treatment.  If the scapula is metastatic from the lung then she has stage IV disease and surgery would have no role.  Also needs the findings related to her colonic anastomosis evaluated.    Plan: Refer back to Dr. Lanny for further evaluation of scapular lesion and to coordinate with GI for colonoscopy.  I spent over 30 minutes in review of records, images, and consultation with Briana Ochoa today. Elspeth JAYSON Millers, MD [facility] [phone]

## 2023-11-01 LAB — FUNGUS CULTURE WITH STAIN

## 2023-11-01 LAB — FUNGUS CULTURE RESULT

## 2023-11-01 LAB — FUNGAL ORGANISM REFLEX

## 2023-11-03 ENCOUNTER — Inpatient Hospital Stay: Admitting: Hematology

## 2023-11-03 ENCOUNTER — Telehealth: Payer: Self-pay

## 2023-11-03 ENCOUNTER — Inpatient Hospital Stay

## 2023-11-03 ENCOUNTER — Other Ambulatory Visit: Payer: Self-pay

## 2023-11-03 VITALS — BP 128/60 | HR 73 | Temp 97.6°F | Resp 17 | Ht 64.0 in | Wt 193.8 lb

## 2023-11-03 DIAGNOSIS — Z86000 Personal history of in-situ neoplasm of breast: Secondary | ICD-10-CM | POA: Diagnosis not present

## 2023-11-03 DIAGNOSIS — M898X8 Other specified disorders of bone, other site: Secondary | ICD-10-CM | POA: Diagnosis not present

## 2023-11-03 DIAGNOSIS — Z923 Personal history of irradiation: Secondary | ICD-10-CM | POA: Diagnosis not present

## 2023-11-03 DIAGNOSIS — C3432 Malignant neoplasm of lower lobe, left bronchus or lung: Secondary | ICD-10-CM

## 2023-11-03 DIAGNOSIS — E538 Deficiency of other specified B group vitamins: Secondary | ICD-10-CM

## 2023-11-03 DIAGNOSIS — C186 Malignant neoplasm of descending colon: Secondary | ICD-10-CM

## 2023-11-03 DIAGNOSIS — Z8551 Personal history of malignant neoplasm of bladder: Secondary | ICD-10-CM | POA: Diagnosis not present

## 2023-11-03 DIAGNOSIS — Z87891 Personal history of nicotine dependence: Secondary | ICD-10-CM | POA: Diagnosis not present

## 2023-11-03 DIAGNOSIS — Z9221 Personal history of antineoplastic chemotherapy: Secondary | ICD-10-CM | POA: Diagnosis not present

## 2023-11-03 DIAGNOSIS — C7801 Secondary malignant neoplasm of right lung: Secondary | ICD-10-CM | POA: Diagnosis not present

## 2023-11-03 DIAGNOSIS — Z800 Family history of malignant neoplasm of digestive organs: Secondary | ICD-10-CM | POA: Diagnosis not present

## 2023-11-03 LAB — CMP (CANCER CENTER ONLY)
ALT: 7 U/L (ref 0–44)
AST: 11 U/L — ABNORMAL LOW (ref 15–41)
Albumin: 4 g/dL (ref 3.5–5.0)
Alkaline Phosphatase: 100 U/L (ref 38–126)
Anion gap: 5 (ref 5–15)
BUN: 27 mg/dL — ABNORMAL HIGH (ref 8–23)
CO2: 29 mmol/L (ref 22–32)
Calcium: 9.7 mg/dL (ref 8.9–10.3)
Chloride: 106 mmol/L (ref 98–111)
Creatinine: 1.67 mg/dL — ABNORMAL HIGH (ref 0.44–1.00)
GFR, Estimated: 30 mL/min — ABNORMAL LOW (ref >60)
Glucose, Bld: 129 mg/dL — ABNORMAL HIGH (ref 70–99)
Potassium: 5.6 mmol/L — ABNORMAL HIGH (ref 3.5–5.1)
Sodium: 140 mmol/L (ref 135–145)
Total Bilirubin: 0.3 mg/dL (ref 0.0–1.2)
Total Protein: 7 g/dL (ref 6.5–8.1)

## 2023-11-03 LAB — CBC WITH DIFFERENTIAL (CANCER CENTER ONLY)
Abs Immature Granulocytes: 0.03 K/uL (ref 0.00–0.07)
Basophils Absolute: 0.1 K/uL (ref 0.0–0.1)
Basophils Relative: 1 %
Eosinophils Absolute: 0.3 K/uL (ref 0.0–0.5)
Eosinophils Relative: 3 %
HCT: 46.6 % — ABNORMAL HIGH (ref 36.0–46.0)
Hemoglobin: 15.4 g/dL — ABNORMAL HIGH (ref 12.0–15.0)
Immature Granulocytes: 0 %
Lymphocytes Relative: 16 %
Lymphs Abs: 1.5 K/uL (ref 0.7–4.0)
MCH: 31.1 pg (ref 26.0–34.0)
MCHC: 33 g/dL (ref 30.0–36.0)
MCV: 94.1 fL (ref 80.0–100.0)
Monocytes Absolute: 1 K/uL (ref 0.1–1.0)
Monocytes Relative: 10 %
Neutro Abs: 6.8 K/uL (ref 1.7–7.7)
Neutrophils Relative %: 70 %
Platelet Count: 314 K/uL (ref 150–400)
RBC: 4.95 MIL/uL (ref 3.87–5.11)
RDW: 13.6 % (ref 11.5–15.5)
WBC Count: 9.8 K/uL (ref 4.0–10.5)
nRBC: 0 % (ref 0.0–0.2)

## 2023-11-03 LAB — VITAMIN B12: Vitamin B-12: 771 pg/mL (ref 180–914)

## 2023-11-03 NOTE — Assessment & Plan Note (Addendum)
 pT3N0M0, Stage II, MSI-High -diagnosed on 04/23/20 by Colonoscopy performed for heme-positive stool and anemia. -resection with Dr Debby on 05/28/20 showed 9.7cm mass in splenic flexure with invasive adenocarcinoma, poorly differentiated, negative margins, negative LNs.  -GuardantReveal on 07/07/20 was positive -staging CT CAP w/o contrast on 08/31/20 showed NED. -She began Xeloda  and Keytruda  on 09/21/20. Xeloda  held due to increased Cr and then low EGFR from 12/04/20 - 02/09/21, and Keytruda  was held due to taste change/mouth numbness from 12/25/20 - 02/09/21. Completed Xeloda  in 04/2021 and Keytruda  on 09/17/21. -restaging CT from 09/06/2022, which showed no evidence of cancer recurrence, I personally reviewed the images with her  -restaging CT from 04/04/2023 showed Interval increase in size of the 11 mm posterior left upper lobe pulmonary nodule with a new 4 mm right lower lobe pulmonary nodule -repeated CT chest 08/08/2023 showed increased LUL lung nodule from 1.3 to 2cm, biopsy confirmed metastatic colon cancer. IHC studies was positive for CK7 and cytokeratin AE1/AE3 but negative for lung, breast and colon markers, the cytomorphology favoring lung or upper GI. - PET scan showed osteo metastasis in the right scapula, hypermetabolic uptake at colonic anastomosis, in addition to the known left lung malignancy, and new left hilar nodal metastasis.

## 2023-11-03 NOTE — Progress Notes (Signed)
 [facility]   [phone] [phone]   Clinic Follow up Note   Patient Care Team: Nche, Roselie Rockford, NP as PCP - General (Internal Medicine) Odean Potts, MD as Consulting Physician (Hematology and Oncology) Rosemarie Eather RAMAN, MD as Consulting Physician (Neurology) Lanis Pupa, MD as Consulting Physician (Neurosurgery) Charmayne Molly, MD as Consulting Physician (Ophthalmology) Lanny Callander, MD as Consulting Physician (Hematology and Oncology) Debby Hila, MD as Consulting Physician (General Surgery)  Date of Service:  11/03/2023  CHIEF COMPLAINT: f/u of metastatic adenocarcinoma  CURRENT THERAPY:  Pending first-line systemic therapy  Oncology History   Cancer of left colon (HCC) pT3N0M0, Stage II, MSI-High -diagnosed on 04/23/20 by Colonoscopy performed for heme-positive stool and anemia. -resection with Dr Debby on 05/28/20 showed 9.7cm mass in splenic flexure with invasive adenocarcinoma, poorly differentiated, negative margins, negative LNs.  -GuardantReveal on 07/07/20 was positive -staging CT CAP w/o contrast on 08/31/20 showed NED. -She began Xeloda  and Keytruda  on 09/21/20. Xeloda  held due to increased Cr and then low EGFR from 12/04/20 - 02/09/21, and Keytruda  was held due to taste change/mouth numbness from 12/25/20 - 02/09/21. Completed Xeloda  in 04/2021 and Keytruda  on 09/17/21. -restaging CT from 09/06/2022, which showed no evidence of cancer recurrence, I personally reviewed the images with her  -restaging CT from 04/04/2023 showed Interval increase in size of the 11 mm posterior left upper lobe pulmonary nodule with a new 4 mm right lower lobe pulmonary nodule -repeated CT chest 08/08/2023 showed increased LUL lung nodule from 1.3 to 2cm, biopsy confirmed metastatic colon cancer. IHC studies was positive for CK7 and cytokeratin AE1/AE3 but negative for lung, breast and colon markers, the cytomorphology favoring lung or upper GI. - PET scan  showed osteo metastasis in the right scapula, hypermetabolic uptake at colonic anastomosis, in addition to the known left lung malignancy, and new left hilar nodal metastasis.   Assessment & Plan Metastatic adenocarcinoma Metastatic adenocarcinoma of the colon with confirmed metastasis to the lung and probable metastases to lymph nodes and bone. PET scan shows increased activity in the lung, lymph nodes, and a bone lesion in the right shoulder blade. Lung biopsy confirmed adenocarcinoma consistent with colon origin, though pathology is not definitive. Guardian test negative, not ruling out colon cancer recurrence. Treatment plan contingent on further testing to confirm primary cancer site. - Order Guardian 360 blood test to detect tumor mutations. - Arrange for upper GI endoscopy and colonoscopy to rule out upper GI primary, and or recurrent colon cancer. - I also called pathologist Dr. Fannie, he will repeat CK7 and cytokeratin on her previous colon cancer, to see if her lung lesion is metastatic colon. - Dr. Belvie also were reviewed to see if the lung biopsy has adequate tissue for NGS and tissue ID - I will call her when I have the above information. - Her daughter has abdominal hernia surgery scheduled in 2 weeks, patient will be her caregiver after her surgery.  If patient needs chemotherapy, would likely start in 3 to 4 weeks. - Patient's daughter had many questions regarding her diagnosis, prognosis and treatment options, I answered all to the best of my knowledge.  Plan - I reached out to pathology and requested additional IHC studies on her previous colon sample, and we will send her lung biopsy for NGS Tempus plus tissue ID if sample is adequate - I reach out to [facility], and requested an urgent EGD and colonoscopy in the next 1 to 2 weeks - Will  obtain Guardant360 today - Plan to call her in 2 weeks to review the above results     SUMMARY OF ONCOLOGIC HISTORY: Oncology History  Overview Note  Cancer Staging Cancer of left colon [facility]) Staging form: Colon and Rectum, AJCC 8th Edition - Pathologic stage from 05/28/2020: Stage IIA (pT3, pN0, cM0) - Signed by Lanny Callander, MD on 06/19/2020 Stage prefix: Initial diagnosis Total positive nodes: 0 Histologic grading system: 4 grade system Histologic grade (G): G3 Residual tumor (R): R0 - None  Ductal carcinoma in situ (DCIS) of left breast Staging form: Breast, AJCC 7th Edition - Clinical: Stage 0 (Tis (DCIS), N0, cM0) - Unsigned Specimen type: Core Needle Biopsy Histopathologic type: 9932 Laterality: Left Staging comments: Staged at breast conference 7.22.15  - Pathologic: Stage 0 (Tis (DCIS)(2), N0, cM0) - Unsigned Specimen type: Core Needle Biopsy Histopathologic type: 9932 Laterality: Left Tumor size (mm): 3 Multiple tumors: Yes Number of tumors: 2 Method of lymph node assessment: Clinical Method of detection of distant metastases: Clinical Residual tumor (R): R0 - None Estrogen receptor status: Positive Progesterone receptor status: Positive    Ductal carcinoma in situ (DCIS) of left breast  08/20/2013 Initial Diagnosis   Left breast 12:00: DCIS with calcifications, yet 100%, PR 100%; 09/04/2013 second group of calcifications also biopsied proven to be DCIS ER/PR positive   09/19/2013 Surgery   Left breast lumpectomy DCIS 2 foci margins negative, 0.2 and 0.3 cm ER 100% PR 100%   10/08/2013 - 11/12/2013 Radiation Therapy   Adjuvant radiation therapy   11/12/2013 - 2020 Anti-estrogen oral therapy   Anastrozole  1 mg daily plan is for 5 years   01/09/2014 - 01/10/2014 Hospital Admission   Pipeline embolization 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 The report date is 07/30/2020.   The CancerNext-Expanded + RNAinsight gene panel offered by [employer] and includes sequencing and  rearrangement analysis for the following 77 genes: AIP, ALK, APC, ATM, AXIN2, BAP1, BARD1, BLM, BMPR1A, BRCA1, BRCA2, BRIP1, CDC73, CDH1, CDK4, CDKN1B, CDKN2A, CHEK2, CTNNA1, DICER1, FANCC, FH, FLCN, GALNT12, KIF1B, LZTR1, MAX, MEN1, MET, MLH1, MSH2, MSH3, MSH6, MUTYH, NBN, NF1, NF2, NTHL1, PALB2, PHOX2B, PMS2, POT1, PRKAR1A, PTCH1, PTEN, RAD51C, RAD51D, RB1, RECQL, RET, SDHA, SDHAF2, SDHB, SDHC, SDHD, SMAD4, SMARCA4, SMARCB1, SMARCE1, STK11, SUFU, TMEM127, TP53, TSC1, TSC2, VHL and XRCC2 (sequencing and deletion/duplication); EGFR, EGLN1, HOXB13, KIT, MITF, PDGFRA, POLD1 and POLE (sequencing only); EPCAM and GREM1 (deletion/duplication only). RNA data is routinely analyzed for use in variant interpretation for all genes.   Bladder cancer (HCC)  02/09/2018 Initial Diagnosis   Bladder cancer (HCC)   07/30/2020 Genetic Testing   Negative genetic testing:  No pathogenic variants detected on the [employer] CancerNext-Expanded + RNAinsight panel. The report date is 07/30/2020.   The CancerNext-Expanded + RNAinsight gene panel offered by [employer] and includes sequencing and rearrangement analysis for the following 77 genes: AIP, ALK, APC, ATM, AXIN2, BAP1, BARD1, BLM, BMPR1A, BRCA1, BRCA2, BRIP1, CDC73, CDH1, CDK4, CDKN1B, CDKN2A, CHEK2, CTNNA1, DICER1, FANCC, FH, FLCN, GALNT12, KIF1B, LZTR1, MAX, MEN1, MET, MLH1, MSH2, MSH3, MSH6, MUTYH, NBN, NF1, NF2, NTHL1, PALB2, PHOX2B, PMS2, POT1, PRKAR1A, PTCH1, PTEN, RAD51C, RAD51D, RB1, RECQL, RET, SDHA, SDHAF2, SDHB, SDHC, SDHD, SMAD4, SMARCA4, SMARCB1, SMARCE1, STK11, SUFU, TMEM127, TP53, TSC1, TSC2, VHL and XRCC2 (sequencing and deletion/duplication); EGFR, EGLN1, HOXB13, KIT, MITF, PDGFRA, POLD1 and POLE (sequencing only); EPCAM and GREM1 (deletion/duplication only). RNA  data is routinely analyzed for use in variant interpretation for all genes.   08/31/2020 Imaging   CT CAP w/o contrast  IMPRESSION: 1. No specific findings of active malignancy on today's  noncontrast CT. There has been interval partial colectomy involving the tumor site with reanastomosis. Some faint stranding in the adjacent omentum is probably from scarring or fat necrosis, less likely due to early tumor given the lack of overt nodularity. Surveillance imaging is likely indicated, and PET-CT may have a role in imaging follow up. 2. Several small pulmonary nodules, in addition to a larger mixed density nodule in the left upper lobe. Surveillance is recommended. 3. Other imaging findings of potential clinical significance: Aortic Atherosclerosis (ICD10-I70.0). Coronary atherosclerosis. Mitral valve calcification. Small type 1 hiatal hernia. Splenectomy with small amount of regenerative splenic tissue as well as a venous varix arising from the splenic vein. Hyperdense left kidney lower pole exophytic homogeneous lesion, probably a complex cyst. Sigmoid colon diverticulosis. Multilevel lumbar spondylosis and degenerative disc disease.   Cancer of left colon (HCC)  04/23/2020 Procedure   Upper Endoscopy and Colonoscopy by Dr Avram IMPRESSION Erythematous mucosa in the antrum. Biopsied. - Small hiatal hernia. - Gastroesophageal flap valve classified as Hill Grade IV (no fold, wide open lumen, hiatal hernia present). - The examination was otherwise normal.   IMPRESSION - Decreased sphincter tone found on digital rectal exam. - Malignant partially obstructing tumor in the proximal descending colon. Biopsied. Tattooed. - Three 5 to 20 mm polyps in the sigmoid colon, removed piecemeal using a cold snare. Resected and retrieved. - Severe diverticulosis in the sigmoid colon. - I left several diminutive descending (proximal to tattoo), sigmoid and rectal polyps - The examination was otherwise normal on direct and retroflexion views.     04/23/2020 Initial Diagnosis   Diagnosis 1. Descending Colon Polyp, mass - ADENOCARCINOMA. 2. Sigmoid Colon Polyp, (3) - MULTIPLE  FRAGMENTS OF TUBULAR ADENOMA. - MULTIPLE FRAGMENTS OF SESSILE SERRATED POLYP WITHOUT DYSPLASIA. - NO HIGH GRADE DYSPLASIA OR MALIGNANCY. Microscopic Comment 1. Dr. Rebbecca has reviewed the case. Dr. Avram was notified on 04/24/2020. 2. Despite being left sided the fragments have features of a sessile serrated polyp, as opposed to, hyperplastic polyp.   04/27/2020 Imaging   CT AP IMPRESSION: Bulky annular soft tissue mass involving the distal transverse colon with extension into adjacent pericolonic fat, consistent with primary colon carcinoma.   No evidence of metastatic disease.   Colonic diverticulosis. No radiographic evidence of diverticulitis.   Aortic Atherosclerosis (ICD10-I70.0).     05/05/2020 Imaging   CT Chest IMPRESSION: Bulky annular soft tissue mass involving the distal transverse colon with extension into adjacent pericolonic fat, consistent with primary colon carcinoma.   No evidence of metastatic disease.   Colonic diverticulosis. No radiographic evidence of diverticulitis.   Aortic Atherosclerosis (ICD10-I70.0).     05/28/2020 Initial Diagnosis   Cancer of left colon (HCC)   05/28/2020 Cancer Staging   Staging form: Colon and Rectum, AJCC 8th Edition - Pathologic stage from 05/28/2020: Stage IIA (pT3, pN0, cM0) - Signed by Lanny Callander, MD on 06/19/2020 Stage prefix: Initial diagnosis Total positive nodes: 0 Histologic grading system: 4 grade system Histologic grade (G): G3 Residual tumor (R): R0 - None   05/28/2020 Surgery   XI ROBOT ASSISTED RESECTION OF SPLENIC FLEXURE by Dr Debby    FINAL MICROSCOPIC DIAGNOSIS:   A. COLON, SPLENIC FLEXURE, RESECTION:  - Invasive adenocarcinoma, poorly differentiated, spanning 9.7 cm.  - Tumor invades through muscularis propria into  pericolonic tissue.  - Resection margins are negative.  - Tubular adenoma (X1).  - Sessile serrate polyp without dysplasia (x2).  - Diverticulosis.  - Polypectomy scar.  -  Twenty-four of twenty-four lymph nodes negative for carcinoma (0/24).  - See oncology table.    07/07/2020 Miscellaneous   Guardant Reveal ctDNA positive MSI high   07/30/2020 Genetic Testing   Negative genetic testing:  No pathogenic variants detected on the [employer] CancerNext-Expanded + RNAinsight panel. The report date is 07/30/2020.   The CancerNext-Expanded + RNAinsight gene panel offered by [employer] and includes sequencing and rearrangement analysis for the following 77 genes: AIP, ALK, APC, ATM, AXIN2, BAP1, BARD1, BLM, BMPR1A, BRCA1, BRCA2, BRIP1, CDC73, CDH1, CDK4, CDKN1B, CDKN2A, CHEK2, CTNNA1, DICER1, FANCC, FH, FLCN, GALNT12, KIF1B, LZTR1, MAX, MEN1, MET, MLH1, MSH2, MSH3, MSH6, MUTYH, NBN, NF1, NF2, NTHL1, PALB2, PHOX2B, PMS2, POT1, PRKAR1A, PTCH1, PTEN, RAD51C, RAD51D, RB1, RECQL, RET, SDHA, SDHAF2, SDHB, SDHC, SDHD, SMAD4, SMARCA4, SMARCB1, SMARCE1, STK11, SUFU, TMEM127, TP53, TSC1, TSC2, VHL and XRCC2 (sequencing and deletion/duplication); EGFR, EGLN1, HOXB13, KIT, MITF, PDGFRA, POLD1 and POLE (sequencing only); EPCAM and GREM1 (deletion/duplication only). RNA data is routinely analyzed for use in variant interpretation for all genes.   08/31/2020 Imaging   CT CAP w/o contrast  IMPRESSION: 1. No specific findings of active malignancy on today's noncontrast CT. There has been interval partial colectomy involving the tumor site with reanastomosis. Some faint stranding in the adjacent omentum is probably from scarring or fat necrosis, less likely due to early tumor given the lack of overt nodularity. Surveillance imaging is likely indicated, and PET-CT may have a role in imaging follow up. 2. Several small pulmonary nodules, in addition to a larger mixed density nodule in the left upper lobe. Surveillance is recommended. 3. Other imaging findings of potential clinical significance: Aortic Atherosclerosis (ICD10-I70.0). Coronary atherosclerosis. Mitral valve calcification.  Small type 1 hiatal hernia. Splenectomy with small amount of regenerative splenic tissue as well as a venous varix arising from the splenic vein. Hyperdense left kidney lower pole exophytic homogeneous lesion, probably a complex cyst. Sigmoid colon diverticulosis. Multilevel lumbar spondylosis and degenerative disc disease.   10/02/2020 - 09/17/2021 Chemotherapy   Patient is on Treatment Plan : COLORECTAL Pembrolizumab  q21d     03/01/2021 Imaging   EXAM: CT CHEST, ABDOMEN AND PELVIS WITHOUT CONTRAST  IMPRESSION: 1. Mixed attenuation lesion posterior left upper lobe has areas of more confluent and enlarging nodularity today. Interval progression raises concern for neoplasm. Consider PET-CT to further evaluate. 2. Stable 7 mm right lower lobe pulmonary nodule with a slightly more conspicuous 3 mm nodule in the right lower lobe. Continued close attention on follow-up recommended. 3. No evidence for metastatic disease in the abdomen or pelvis. 4. Aortic Atherosclerosis (ICD10-I70.0).   03/17/2021 PET scan   IMPRESSION: 1. Part solid nodule of the left upper lobe demonstrates mild FDG uptake, and has increased in size when compared with prior exam dated May 05, 2020. Concerning for indolent primary lung neoplasm. 2. No evidence of metastatic disease in the chest, abdomen or pelvis. 3. 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 Recommend further evaluation with tissue sampling. 4. Aortic Atherosclerosis (ICD10-I70.0).   03/15/2022 Imaging    IMPRESSION: Stable irregular solid pulmonary nodule in posterior left upper lobe.   No evidence of recurrent or metastatic disease within the abdomen or pelvis.   Colonic diverticulosis, without radiographic evidence of diverticulitis.      Discussed the use of  AI scribe software for clinical note transcription with the patient, who gave verbal consent to proceed.  History of Present  Illness Briana Ochoa is an 83 year old female with metastatic colon cancer who presents for follow-up. She is accompanied by her daughter.  She has metastatic colon cancer with recent PET scan findings showing metastases in the lung, ipsilateral lymph nodes, right shoulder blade, and an area of previous colon surgery. There is no pain in the shoulder blade. Previous treatments included Keytruda  and oral chemotherapy, which she believes delayed recurrence, but the cancer has returned. A Guardian test in July showed no cancer cells, specific for colon cancer, and there is consideration for additional tests to determine the primary source of the cancer due to non-specific pathology results.     All other systems were reviewed with the patient and are negative.  MEDICAL HISTORY:  Past Medical History:  Diagnosis Date   Allergy    seasonal allergies   Anemia    on meds   Aortic atherosclerosis    Bladder cancer (HCC) 2020   Bladder tumor    Bloody diarrhea 09/29/2017   Breast cancer (HCC) 2015   Left Breast Cancer   Cataract    bilateral -sx   CKD (chronic kidney disease), stage III (HCC)    DM related   Colon cancer (HCC)    Diabetes mellitus type 2, diet-controlled (HCC)    diet controlled   Dyspnea 09/28/2017   Dysuria    Family history of adverse reaction to anesthesia    aunt- N/V    Family history of breast cancer    Family history of kidney cancer    Family history of throat cancer    Family history of uterine cancer    Fatigue 09/28/2017   Frequency of urination    Grade I diastolic dysfunction 08/06/2014   Noted on ECHO   Gross hematuria 09/29/2017   Heart murmur    since rheumatic fever as child   Hematuria    History of cardiomegaly 02/12/2005   Mild, noted on CXR   History of CVA (cerebrovascular accident) 08/05/2014   post op cerebral angiogram, cerebral thrombosis w/ cerebral infarction;  11-02-2017  per pt no residuals   History of malignant melanoma  of skin 12/2015   s/p wide local excision right lower leg (per pt localized)   History of rheumatic fever as a child    Hyperlipidemia    on meds   Hypertension    on meds   Intracranial aneurysm dx 07/ 2015   paraophthalmic RICA aneurysm /   s/p  Pipeline embolization right ICA 01-09-2014   Jaundice 09/28/2017   Malignant neoplasm of central portion of left breast in female, estrogen receptor positive [facility]) oncologist-  dr odean   dx 07/ 2015--- DCIS, ER/PR positive-- s/p  left breast lumpectomy 09-19-2013,  completed radiation 11-12-2013,  started arimidex  11-12-2013   Melanoma (HCC)    righ tleg    OA (osteoarthritis)    knees, hands, R shoulder   Osteopenia 10/16/2012   Diffuse   Personal history of radiation therapy    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   Sigmoid diverticulosis    Weakness 09/28/2017   Wears dentures    upper   Wears glasses    Wears hearing aid in both ears     SURGICAL HISTORY: Past Surgical History:  Procedure Laterality Date   BREAST LUMPECTOMY Left 2015   BREAST LUMPECTOMY WITH  NEEDLE LOCALIZATION Left 09/19/2013   Procedure: LEFT BREAST LUMPECTOMY WITH DOUBLE WIRE BRACKETED  NEEDLE LOCALIZATION;  Surgeon: Elon CHRISTELLA Pacini, MD;  [facility];  Service: General;  Laterality: Left;   BRONCHIAL BIOPSY  04/06/2021   Procedure: BRONCHIAL BIOPSIES;  Surgeon: Brenna Adine CROME, DO;  Location: [facility] ENDOSCOPY;  Service: Pulmonary;;   BRONCHIAL BRUSHINGS  04/06/2021   Procedure: BRONCHIAL BRUSHINGS;  Surgeon: Brenna Adine CROME, DO;  Location: [facility];  Service: Pulmonary;;   CATARACT EXTRACTION W/ INTRAOCULAR LENS  IMPLANT, BILATERAL  06/2015   CESAREAN SECTION  1964   CHOLECYSTECTOMY OPEN  1970s   CYSTOSCOPY W/ URETERAL STENT PLACEMENT Left 11/07/2017   Procedure: CYSTOSCOPY WITH RETROGRADE PYELOGRAM/URETERAL STENT PLACEMENT;  Surgeon: Sherrilee Belvie CROME, MD;  Location[facility];  Service: Urology;  Laterality: Left;    CYSTOSCOPY W/ URETERAL STENT PLACEMENT Bilateral 04/27/2018   Procedure: CYSTOSCOPY WITH RETROGRADE PYELOGRAM;  Surgeon: Sherrilee Belvie CROME, MD;  Location[facility];  Service: Urology;  Laterality: Bilateral;   FINGER SURGERY     lt thumb  from dog bite   IR  NEPHROURETERAL CATH PLACE LEFT  04/28/2018   MELANOMA EXCISION Right 12/28/2015   Procedure: EXCISION MELANOMA RIGHT LOWER LEG;  Surgeon: Elon Pacini, MD;  Location: [facility];  Service: General;  Laterality: Right;   RADIOLOGY WITH ANESTHESIA N/A 01/09/2014   Procedure: Embolization;  Surgeon: Gerldine Maizes, MD;  Location: [facility] OR;  Service: Radiology;  Laterality: N/A;   SPLENECTOMY, TOTAL  2009 approx.   splen injury due to fall off horse   TONSILLECTOMY  child   TRANSURETHRAL RESECTION OF BLADDER TUMOR N/A 11/07/2017   Procedure: TRANSURETHRAL RESECTION OF BLADDER TUMOR (TURBT), POSSIBLE STENT PLACEMENT;  Surgeon: Sherrilee Belvie CROME, MD;  [facility];  Service: Urology;  Laterality: N/A;   TRANSURETHRAL RESECTION OF BLADDER TUMOR N/A 12/14/2017   Procedure: TRANSURETHRAL RESECTION OF BLADDER TUMOR (TURBT);  Surgeon: Sherrilee Belvie CROME, MD;  Location: [facility];  Service: Urology;  Laterality: N/A;  30 MINS   TRANSURETHRAL RESECTION OF BLADDER TUMOR N/A 04/27/2018   Procedure: TRANSURETHRAL RESECTION OF BLADDER TUMOR (TURBT);  Surgeon: Sherrilee Belvie CROME, MD;  Location: [facility];  Service: Urology;  Laterality: N/A;  30 MINS   VIDEO BRONCHOSCOPY WITH ENDOBRONCHIAL NAVIGATION Bilateral 10/02/2023   Procedure: VIDEO BRONCHOSCOPY WITH ENDOBRONCHIAL NAVIGATION;  Surgeon: Shelah Lamar RAMAN, MD;  Location[facility] ENDOSCOPY;  Service: Pulmonary;  Laterality: Bilateral;   VIDEO BRONCHOSCOPY WITH RADIAL ENDOBRONCHIAL ULTRASOUND  04/06/2021   Procedure: RADIAL ENDOBRONCHIAL ULTRASOUND;  Surgeon: Brenna Adine CROME, DO;  Location: [facility];  Service:  Pulmonary;;    I have reviewed the social history and family history with the patient and they are unchanged from previous note.  ALLERGIES:  is allergic to feraheme  [ferumoxytol ].  MEDICATIONS:  Current Outpatient Medications  Medication Sig Dispense Refill   acetaminophen  (TYLENOL ) 500 MG tablet Take 1,000 mg by mouth daily.     aspirin  EC 81 MG tablet Take 81 mg by mouth daily. Swallow whole.     Cyanocobalamin  (VITAMIN B 12 PO) Take 1 tablet by mouth 3 (three) times a week. Take 1 tablet by mouth 3 times a week. Take on Monday, Wednesday and Friday     glucose blood (TRUE METRIX BLOOD GLUCOSE TEST) test strip Use as instructed to test blood sugar once daily E08.638 100 each 2   lisinopril  (ZESTRIL ) 20 MG tablet Take 1 tablet (20  mg total) by mouth daily. 90 tablet 3   pravastatin  (PRAVACHOL ) 20 MG tablet Take 1 tablet (20 mg total) by mouth every evening. 90 tablet 1   No current facility-administered medications for this visit.    PHYSICAL EXAMINATION: ECOG PERFORMANCE STATUS: 0 - Asymptomatic  Vitals:   11/03/23 0952  BP: 128/60  Pulse: 73  Resp: 17  Temp: 97.6 F (36.4 C)  SpO2: 92%   Wt Readings from Last 3 Encounters:  11/03/23 193 lb 12.8 oz (87.9 kg)  10/31/23 195 lb (88.5 kg)  10/19/23 194 lb 6.4 oz (88.2 kg)     GENERAL:alert, no distress and comfortable SKIN: skin color, texture, turgor are normal, no rashes or significant lesions EYES: normal, Conjunctiva are pink and non-injected, sclera clear NECK: supple, thyroid  normal size, non-tender, without nodularity LYMPH:  no palpable lymphadenopathy in the cervical, axillary  LUNGS: clear to auscultation and percussion with normal breathing effort HEART: regular rate & rhythm and no murmurs and no lower extremity edema ABDOMEN:abdomen soft, non-tender and normal bowel sounds Musculoskeletal:no cyanosis of digits and no clubbing  NEURO: alert & oriented x 3 with fluent speech, no focal motor/sensory  deficits  Physical Exam    LABORATORY DATA:  I have reviewed the data as listed    Latest Ref Rng & Units 11/03/2023   11:04 AM 10/02/2023    6:04 AM 08/22/2023    3:27 PM  CBC  WBC 4.0 - 10.5 K/uL 9.8  9.0  9.8   Hemoglobin 12.0 - 15.0 g/dL 84.5  84.8  85.4   Hematocrit 36.0 - 46.0 % 46.6  46.3  44.0   Platelets 150 - 400 K/uL 314  274  270         Latest Ref Rng & Units 11/03/2023   11:04 AM 10/02/2023    6:04 AM 08/22/2023    3:27 PM  CMP  Glucose 70 - 99 mg/dL 870  857  864   BUN 8 - 23 mg/dL 27  26  28    Creatinine 0.44 - 1.00 mg/dL 8.32  8.33  8.40   Sodium 135 - 145 mmol/L 140  138  140   Potassium 3.5 - 5.1 mmol/L 5.6  4.8  4.6   Chloride 98 - 111 mmol/L 106  109  107   CO2 22 - 32 mmol/L 29  23  26    Calcium  8.9 - 10.3 mg/dL 9.7  9.1  9.5   Total Protein 6.5 - 8.1 g/dL 7.0  6.4  6.8   Total Bilirubin 0.0 - 1.2 mg/dL 0.3  0.6  0.3   Alkaline Phos 38 - 126 U/L 100  77  89   AST 15 - 41 U/L 11  16  13    ALT 0 - 44 U/L 7  12  8        RADIOGRAPHIC STUDIES: I have personally reviewed the radiological images as listed and agreed with the findings in the report. No results found.    No orders of the defined types were placed in this encounter.  All questions were answered. The patient knows to call the clinic with any problems, questions or concerns. No barriers to learning was detected. The total time spent in the appointment was 40 minutes, including review of chart and various tests results, discussions about plan of care and coordination of care plan     Onita Mattock, MD 11/03/2023

## 2023-11-03 NOTE — Telephone Encounter (Signed)
 Message received via secure chat from Dr Albertus.  Patient needing EGD/colonoscopy 1 to 2 weeks per Dr Lanny  She had colon cancer 3 years now, now has metastatic cancer in lung, bone and colon anastomosis on PET. We have biopsy her lung mass which showed malignancy, but uncertain the primary, possible lung vs upper GI, less likely colon primary, so I need both upper and low endoscopy to rule out new primary vs recurrence of colon cancer.    Scheduled and notified the patient. Pre-visit in person 11/06/23 at 1:00 pm. EGD/colonoscopy in [facility] arrive at 2:30 pm 11/08/23.

## 2023-11-06 ENCOUNTER — Ambulatory Visit (AMBULATORY_SURGERY_CENTER): Admitting: *Deleted

## 2023-11-06 ENCOUNTER — Telehealth: Payer: Self-pay | Admitting: Hematology

## 2023-11-06 ENCOUNTER — Other Ambulatory Visit: Payer: Self-pay

## 2023-11-06 VITALS — Ht 64.0 in | Wt 193.0 lb

## 2023-11-06 DIAGNOSIS — R897 Abnormal histological findings in specimens from other organs, systems and tissues: Secondary | ICD-10-CM

## 2023-11-06 DIAGNOSIS — Z85038 Personal history of other malignant neoplasm of large intestine: Secondary | ICD-10-CM

## 2023-11-06 DIAGNOSIS — R9389 Abnormal findings on diagnostic imaging of other specified body structures: Secondary | ICD-10-CM

## 2023-11-06 MED ORDER — NA SULFATE-K SULFATE-MG SULF 17.5-3.13-1.6 GM/177ML PO SOLN: 1.0000 | Freq: Once | ORAL | 0 refills | Status: DC

## 2023-11-06 MED ORDER — NA SULFATE-K SULFATE-MG SULF 17.5-3.13-1.6 GM/177ML PO SOLN: 1.0000 | Freq: Once | ORAL | 0 refills | Status: AC

## 2023-11-06 NOTE — Telephone Encounter (Signed)
 I attempted to inform Briana Ochoa of her follow up telephone appointment but her mailbox is full.

## 2023-11-06 NOTE — Progress Notes (Signed)
 Pt's name and DOB verified at the beginning of the pre-visit with 2 identifiers  Pt denies any difficulty with ambulating,sitting, laying down or rolling side to side  Pt has no issues moving head neck or swallowing  No egg or soy allergy known to patient   No issues known to pt with past sedation  No FH of Malignant Hyperthermia  Pt is not on home 02   Pt is not on blood thinners   Pt denies issues with constipation   Pt is not on dialysis  Pt denise any abnormal heart rhythms   Pt denies any upcoming cardiac testing  Chart not reviewed by CRNA prior to PV  Visit in person  Pt states weight is 193 lb  Pt given  both [facility] main # and MD on call # prior to instructions.  Informed pt to come in at the time discussed and is shown on PV instructions.  Pt instructed to use Single[employer]com or GoodRx for a price reduction on prep  Instructed pt where to find PV instructions in My Ch. Copy of instructions  to be sent in mail and address read back to pt to verify correct on envelope. Instructed pt on all aspects of written instructions including med holds clothing to wear and foods to eat and not eat as well as after procedure legal restrictions and to call MD on call if needed.. Pt states understanding. Instructed pt to review instructions again prior to procedure and call main # given if has any questions or any issues. Pt states they will.

## 2023-11-06 NOTE — Addendum Note (Signed)
 Addended by: MALINDA ORIE CROME on: 11/06/2023 05:14 PM   Modules accepted: Orders

## 2023-11-07 NOTE — Progress Notes (Unsigned)
 [facility] History and Physical   Primary Care Physician:  Nche, Roselie Rockford, NP   Reason for Procedure:    Encounter Diagnoses  Name Primary?   Personal history of colon cancer Yes   Abnormal finding on imaging      Plan:    EGD and colonoscopy     HPI: Briana Ochoa is a 83 y.o. female here at the request of Dr. Lanny because of lung metastases and concern for recurrent or new colon or gastric cancer.  She has a history of colon cancer resected (descending colon) 2022.  Oncology History (per Dr. Lanny)   Cancer of left colon [facility]) pT3N0M0, Stage II, MSI-High -diagnosed on 04/23/20 by Colonoscopy performed for heme-positive stool and anemia. -resection with Dr Debby on 05/28/20 showed 9.7cm mass in splenic flexure with invasive adenocarcinoma, poorly differentiated, negative margins, negative LNs.  -GuardantReveal on 07/07/20 was positive -staging CT CAP w/o contrast on 08/31/20 showed NED. -She began Xeloda  and Keytruda  on 09/21/20. Xeloda  held due to increased Cr and then low EGFR from 12/04/20 - 02/09/21, and Keytruda  was held due to taste change/mouth numbness from 12/25/20 - 02/09/21. Completed Xeloda  in 04/2021 and Keytruda  on 09/17/21. -restaging CT from 09/06/2022, which showed no evidence of cancer recurrence, I personally reviewed the images with her  -restaging CT from 04/04/2023 showed Interval increase in size of the 11 mm posterior left upper lobe pulmonary nodule with a new 4 mm right lower lobe pulmonary nodule -repeated CT chest 08/08/2023 showed increased LUL lung nodule from 1.3 to 2cm, biopsy confirmed metastatic colon cancer. IHC studies was positive for CK7 and cytokeratin AE1/AE3 but negative for lung, breast and colon markers, the cytomorphology favoring lung or upper GI. - PET scan showed osteo metastasis in the right scapula, hypermetabolic uptake at colonic anastomosis, in addition to the known left lung malignancy, and new left hilar nodal  metastasis.     Assessment & Plan Metastatic adenocarcinoma Metastatic adenocarcinoma of the colon with confirmed metastasis to the lung and probable metastases to lymph nodes and bone. PET scan shows increased activity in the lung, lymph nodes, and a bone lesion in the right shoulder blade. Lung biopsy confirmed adenocarcinoma consistent with colon origin, though pathology is not definitive. Guardian test negative, not ruling out colon cancer recurrence. Treatment plan contingent on further testing to confirm primary cancer site. - Order Guardian 360 blood test to detect tumor mutations. - Arrange for upper GI endoscopy and colonoscopy to rule out upper GI primary, and or recurrent colon cancer. - I also called pathologist Dr. Fannie, he will repeat CK7 and cytokeratin on her previous colon cancer, to see if her lung lesion is metastatic colon. - Dr. Belvie also were reviewed to see if the lung biopsy has adequate tissue for NGS and tissue ID - I will call her when I have the above information. - Her daughter has abdominal hernia surgery scheduled in 2 weeks, patient will be her caregiver after her surgery.  If patient needs chemotherapy, would likely start in 3 to 4 weeks. - Patient's daughter had many questions regarding her diagnosis, prognosis and treatment options, I answered all to the best of my knowledge.   Past Medical History:  Diagnosis Date   Allergy    seasonal allergies   Anemia    on meds   Aortic atherosclerosis    Bladder cancer (HCC) 2020   Bladder tumor    Blood transfusion without reported diagnosis    Bloody diarrhea 09/29/2017  Breast cancer (HCC) 2015   Left Breast Cancer   Cataract    bilateral -sx   CKD (chronic kidney disease), stage III (HCC)    DM related   Colon cancer (HCC)    Diabetes mellitus type 2, diet-controlled (HCC)    diet controlled   Dyspnea 09/28/2017   Dysuria    Family history of adverse reaction to anesthesia    aunt- N/V    Family  history of breast cancer    Family history of kidney cancer    Family history of throat cancer    Family history of uterine cancer    Fatigue 09/28/2017   Frequency of urination    Grade I diastolic dysfunction 08/06/2014   Noted on ECHO   Gross hematuria 09/29/2017   Heart murmur    since rheumatic fever as child   Hematuria    History of cardiomegaly 02/12/2005   Mild, noted on CXR   History of CVA (cerebrovascular accident) 08/05/2014   post op cerebral angiogram, cerebral thrombosis w/ cerebral infarction;  11-02-2017  per pt no residuals   History of malignant melanoma of skin 12/2015   s/p wide local excision right lower leg (per pt localized)   History of rheumatic fever as a child    Hyperlipidemia    on meds   Hypertension    on meds   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   Sigmoid diverticulosis    Stroke (HCC)    Weakness 09/28/2017   Wears dentures    upper   Wears glasses    Wears hearing aid in both ears     Past Surgical History:  Procedure Laterality Date   BREAST LUMPECTOMY Left 2015   BREAST LUMPECTOMY WITH NEEDLE LOCALIZATION Left 09/19/2013   Procedure: LEFT BREAST LUMPECTOMY WITH DOUBLE WIRE BRACKETED  NEEDLE LOCALIZATION;  Surgeon: Elon CHRISTELLA Pacini, MD;  Location: [facility] OR;  Service: General;  Laterality: Left;   BRONCHIAL BIOPSY  04/06/2021   Procedure: BRONCHIAL BIOPSIES;  Surgeon:  Brenna Adine CROME, DO;  Location: [facility];  Service: Pulmonary;;   BRONCHIAL BRUSHINGS  04/06/2021   Procedure: BRONCHIAL BRUSHINGS;  Surgeon: Brenna Adine CROME, DO;  Location: [facility];  Service: Pulmonary;;   CATARACT EXTRACTION W/ INTRAOCULAR LENS  IMPLANT, BILATERAL  06/2015   CESAREAN SECTION  1964   CHOLECYSTECTOMY OPEN  1970s   CYSTOSCOPY W/ URETERAL STENT PLACEMENT Left 11/07/2017   Procedure: CYSTOSCOPY WITH RETROGRADE PYELOGRAM/URETERAL STENT PLACEMENT;  Surgeon: Sherrilee Belvie CROME, MD;  Location: [facility];  Service: Urology;  Laterality: Left;   CYSTOSCOPY W/ URETERAL STENT PLACEMENT Bilateral 04/27/2018   Procedure: CYSTOSCOPY WITH RETROGRADE PYELOGRAM;  Surgeon: Sherrilee Belvie CROME, MD;  Location: [facility];  Service: Urology;  Laterality: Bilateral;   FINGER SURGERY     lt thumb  from dog bite   IR  NEPHROURETERAL CATH PLACE LEFT  04/28/2018   MELANOMA EXCISION Right 12/28/2015   Procedure: EXCISION MELANOMA RIGHT LOWER LEG;  Surgeon: Elon Pacini, MD;  Location: [facility];  Service: General;  Laterality: Right;   RADIOLOGY WITH ANESTHESIA N/A 01/09/2014   Procedure: Embolization;  Surgeon: Gerldine Maizes, MD;  Location[facility];  Service: Radiology;  Laterality: N/A;   SPLENECTOMY, TOTAL  2009 approx.   splen injury due to fall off horse   TONSILLECTOMY  child   TRANSURETHRAL RESECTION OF BLADDER TUMOR N/A 11/07/2017   Procedure: TRANSURETHRAL RESECTION OF BLADDER TUMOR (TURBT), POSSIBLE STENT PLACEMENT;  Surgeon: Sherrilee Belvie CROME, MD;  Location: [facility];  Service: Urology;  Laterality: N/A;   TRANSURETHRAL RESECTION OF BLADDER TUMOR N/A 12/14/2017   Procedure: TRANSURETHRAL RESECTION OF BLADDER TUMOR (TURBT);  Surgeon: Sherrilee Belvie CROME, MD;  Location: [facility];  Service: Urology;  Laterality: N/A;  30 MINS   TRANSURETHRAL RESECTION OF BLADDER TUMOR N/A 04/27/2018   Procedure:  TRANSURETHRAL RESECTION OF BLADDER TUMOR (TURBT);  Surgeon: Sherrilee Belvie CROME, MD;  Location: [facility];  Service: Urology;  Laterality: N/A;  30 MINS   VIDEO BRONCHOSCOPY WITH ENDOBRONCHIAL NAVIGATION Bilateral 10/02/2023   Procedure: VIDEO BRONCHOSCOPY WITH ENDOBRONCHIAL NAVIGATION;  Surgeon: Shelah Lamar RAMAN, MD;  Location[facility] ENDOSCOPY;  Service: Pulmonary;  Laterality: Bilateral;   VIDEO BRONCHOSCOPY WITH RADIAL ENDOBRONCHIAL ULTRASOUND  04/06/2021   Procedure: RADIAL ENDOBRONCHIAL ULTRASOUND;  Surgeon: Brenna Adine CROME, DO;  Location: [facility];  Service: Pulmonary;;     Current Outpatient Medications  Medication Sig Dispense Refill   acetaminophen  (TYLENOL ) 500 MG tablet Take 1,000 mg by mouth daily.     aspirin  EC 81 MG tablet Take 81 mg by mouth daily. Swallow whole.     Cyanocobalamin  (VITAMIN B 12 PO) Take 1 tablet by mouth 3 (three) times a week. Take 1 tablet by mouth 3 times a week. Take on Monday, Wednesday and Friday     glucose blood (TRUE METRIX BLOOD GLUCOSE TEST) test strip Use as instructed to test blood sugar once daily E08.638 100 each 2   lisinopril  (ZESTRIL ) 20 MG tablet Take 1 tablet (20 mg total) by mouth daily. 90 tablet 3   pravastatin  (PRAVACHOL ) 20 MG tablet Take 1 tablet (20 mg total) by mouth every evening. 90 tablet 1   No current facility-administered medications for this visit.    Allergies as of 11/08/2023 - Review Complete 11/06/2023  Allergen Reaction Noted   Feraheme  [ferumoxytol ] Shortness Of Breath and Palpitations 04/09/2020    Family History  Problem Relation Age of Onset   Hypertension Mother    Kidney cancer Mother        dx early 75s   Heart attack Paternal Grandfather    Throat cancer Brother 79       hx smoking   Head & neck cancer Son 74       jaw cancer   Breast cancer Maternal Aunt 25   Breast cancer Paternal Aunt 50   Uterine cancer Cousin        dx early 80s (maternal first cousin)   Colon polyps Neg Hx     Colon cancer Neg Hx    Esophageal cancer Neg Hx    Rectal cancer Neg Hx    Stomach cancer Neg Hx     Social History   Socioeconomic History   Marital status: Widowed    Spouse name: Not on file   Number  of children: 4   Years of education: Not on file   Highest education level: Not on file  Occupational History    Comment: Retired  Tobacco Use   Smoking status: Former    Current packs/day: 0.00    Average packs/day: 1.5 packs/day for 40.0 years (60.0 ttl pk-yrs)    Types: Cigarettes    Start date: 03/06/1967    Quit date: 03/06/2007    Years since quitting: 16.6    Passive exposure: Past   Smokeless tobacco: Never  Vaping Use   Vaping status: Never Used  Substance and Sexual Activity   Alcohol use: No   Drug use: Never   Sexual activity: Not on file  Other Topics Concern   Not on file  Social History Narrative   Widowed 2013 originally from [place]  moved to [place] decades ago to work for [employer]   5 children   17 grandchildren      Does not have a living will-desires CPR, does not want prolonged life support if futile.   Social Drivers of Corporate investment banker Strain: Not on file  Food Insecurity: Unknown (04/27/2018)   Hunger Vital Sign    Worried About Running Out of Food in the Last Year: Patient declined    Ran Out of Food in the Last Year: Patient declined  Transportation Needs: Unknown (04/27/2018)   PRAPARE - Administrator, Civil Service (Medical): Patient declined    Lack of Transportation (Non-Medical): Patient declined  Physical Activity: Unknown (04/27/2018)   Exercise Vital Sign    Days of Exercise per Week: Patient declined    Minutes of Exercise per Session: Patient declined  Stress: No Stress Concern Present (04/27/2018)   [employer] of Occupational Health - Occupational Stress Questionnaire    Feeling of Stress : Not at all  Social Connections: Unknown (04/27/2018)   Social Connection and Isolation  Panel    Frequency of Communication with Friends and Family: Patient declined    Frequency of Social Gatherings with Friends and Family: Patient declined    Attends Religious Services: Patient declined    Database administrator or Organizations: Patient declined    Attends Banker Meetings: Patient declined    Marital Status: Patient declined  Intimate Partner Violence: Unknown (04/27/2018)   Humiliation, Afraid, Rape, and Kick questionnaire    Fear of Current or Ex-Partner: Patient declined    Emotionally Abused: Patient declined    Physically Abused: Patient declined    Sexually Abused: Patient declined    Review of Systems: Positive for *** All other review of systems negative except as mentioned in the HPI.  Physical Exam: Vital signs There were no vitals taken for this visit.  General:   Alert,  Well-developed, well-nourished, pleasant and cooperative in NAD Lungs:  Clear throughout to auscultation.   Heart:  Regular rate and rhythm; no murmurs, clicks, rubs,  or gallops. Abdomen:  Soft, nontender and nondistended. Normal bowel sounds.   Neuro/Psych:  Alert and cooperative. Normal mood and affect. A and O x 3   @Sierria Bruney  CHARLENA Commander, MD, [facility] [phone] (pager) 11/07/2023 6:08 PM@

## 2023-11-08 ENCOUNTER — Encounter: Payer: Self-pay | Admitting: Internal Medicine

## 2023-11-08 ENCOUNTER — Ambulatory Visit: Admitting: Internal Medicine

## 2023-11-08 VITALS — BP 95/54 | HR 103 | Temp 97.3°F | Resp 10 | Ht 64.0 in | Wt 193.0 lb

## 2023-11-08 DIAGNOSIS — Z85038 Personal history of other malignant neoplasm of large intestine: Secondary | ICD-10-CM

## 2023-11-08 DIAGNOSIS — Z980 Intestinal bypass and anastomosis status: Secondary | ICD-10-CM

## 2023-11-08 DIAGNOSIS — K9189 Other postprocedural complications and disorders of digestive system: Secondary | ICD-10-CM | POA: Diagnosis not present

## 2023-11-08 DIAGNOSIS — D124 Benign neoplasm of descending colon: Secondary | ICD-10-CM

## 2023-11-08 DIAGNOSIS — I10 Essential (primary) hypertension: Secondary | ICD-10-CM | POA: Diagnosis not present

## 2023-11-08 DIAGNOSIS — K6289 Other specified diseases of anus and rectum: Secondary | ICD-10-CM | POA: Diagnosis not present

## 2023-11-08 DIAGNOSIS — R9389 Abnormal findings on diagnostic imaging of other specified body structures: Secondary | ICD-10-CM

## 2023-11-08 DIAGNOSIS — Z1211 Encounter for screening for malignant neoplasm of colon: Secondary | ICD-10-CM

## 2023-11-08 DIAGNOSIS — D123 Benign neoplasm of transverse colon: Secondary | ICD-10-CM

## 2023-11-08 DIAGNOSIS — K5730 Diverticulosis of large intestine without perforation or abscess without bleeding: Secondary | ICD-10-CM

## 2023-11-08 DIAGNOSIS — K449 Diaphragmatic hernia without obstruction or gangrene: Secondary | ICD-10-CM | POA: Diagnosis not present

## 2023-11-08 DIAGNOSIS — R933 Abnormal findings on diagnostic imaging of other parts of digestive tract: Secondary | ICD-10-CM | POA: Diagnosis not present

## 2023-11-08 DIAGNOSIS — Z87891 Personal history of nicotine dependence: Secondary | ICD-10-CM

## 2023-11-08 DIAGNOSIS — D122 Benign neoplasm of ascending colon: Secondary | ICD-10-CM | POA: Diagnosis not present

## 2023-11-08 DIAGNOSIS — N183 Chronic kidney disease, stage 3 unspecified: Secondary | ICD-10-CM | POA: Diagnosis not present

## 2023-11-08 DIAGNOSIS — K639 Disease of intestine, unspecified: Secondary | ICD-10-CM | POA: Diagnosis not present

## 2023-11-08 DIAGNOSIS — E785 Hyperlipidemia, unspecified: Secondary | ICD-10-CM | POA: Diagnosis not present

## 2023-11-08 DIAGNOSIS — E1122 Type 2 diabetes mellitus with diabetic chronic kidney disease: Secondary | ICD-10-CM | POA: Diagnosis not present

## 2023-11-08 DIAGNOSIS — K648 Other hemorrhoids: Secondary | ICD-10-CM | POA: Diagnosis not present

## 2023-11-08 MED ORDER — SODIUM CHLORIDE 0.9 % IV SOLN: 500.0000 mL | Freq: Once | INTRAVENOUS | Status: DC

## 2023-11-08 NOTE — Progress Notes (Signed)
 1528 Patient experiencing nausea and retching.  MD updated and Zofran  4 mg IV given, vss

## 2023-11-08 NOTE — Progress Notes (Signed)
 Pt's states no medical or surgical changes since previsit or office visit.

## 2023-11-08 NOTE — Progress Notes (Signed)
 Report given to PACU, vss

## 2023-11-08 NOTE — Progress Notes (Signed)
1501 Robinul 0.1 mg IV given due large amount of secretions upon assessment.  MD made aware, vss 

## 2023-11-08 NOTE — Progress Notes (Signed)
 Called to room to assist during endoscopic procedure.  Patient ID and intended procedure confirmed with present staff. Received instructions for my participation in the procedure from the performing physician.

## 2023-11-08 NOTE — Patient Instructions (Addendum)
 The upper endoscopy exam did not show any signs of cancer.  You have a small hiatal hernia.  I found some colon polyps and removed those and where you were sewn back together (the anastomosis) in the colon there was some changes in the lining.  These may be polyps or more likely just changes from being sewn back together and how the body reacted to that.  I took biopsies.  These do not look like cancer to me  I will let you know the results and anything further that needs to be done from this perspective.  I appreciate the opportunity to care for you. Briana CHARLENA Commander, MD, FACG  YOU HAD AN ENDOSCOPIC PROCEDURE TODAY AT THE [facility]:   Refer to the procedure report that was given to you for any specific questions about what was found during the examination.  If the procedure report does not answer your questions, please call your gastroenterologist to clarify.  If you requested that your care partner not be given the details of your procedure findings, then the procedure report has been included in a sealed envelope for you to review at your convenience later.  YOU SHOULD EXPECT: Some feelings of bloating in the abdomen. Passage of more gas than usual.  Walking can help get rid of the air that was put into your GI tract during the procedure and reduce the bloating. If you had a lower endoscopy (such as a colonoscopy or flexible sigmoidoscopy) you may notice spotting of blood in your stool or on the toilet paper. If you underwent a bowel prep for your procedure, you may not have a normal bowel movement for a few days.  Please Note:  You might notice some irritation and congestion in your nose or some drainage.  This is from the oxygen used during your procedure.  There is no need for concern and it should clear up in a day or so.  SYMPTOMS TO REPORT IMMEDIATELY:  Following lower endoscopy (colonoscopy or flexible sigmoidoscopy):  Excessive amounts of blood in the stool  Significant  tenderness or worsening of abdominal pains  Swelling of the abdomen that is new, acute  Fever of 100F or higher  Following upper endoscopy (EGD)  Vomiting of blood or coffee ground material  New chest pain or pain under the shoulder blades  Painful or persistently difficult swallowing  New shortness of breath  Fever of 100F or higher  Black, tarry-looking stools  For urgent or emergent issues, a gastroenterologist can be reached at any hour by calling [phone]. Do not use MyChart messaging for urgent concerns.    DIET:  We do recommend a small meal at first, but then you may proceed to your regular diet.  Drink plenty of fluids but you should avoid alcoholic beverages for 24 hours.  ACTIVITY:  You should plan to take it easy for the rest of today and you should NOT DRIVE or use heavy machinery until tomorrow (because of the sedation medicines used during the test).    FOLLOW UP: Our staff will call the number listed on your records the next business day following your procedure.  We will call around 7:15- 8:00 am to check on you and address any questions or concerns that you may have regarding the information given to you following your procedure. If we do not reach you, we will leave a message.     If any biopsies were taken you will be contacted by phone or by  letter within the next 1-3 weeks.  Please call us  at [phone] if you have not heard about the biopsies in 3 weeks.    SIGNATURES/CONFIDENTIALITY: You and/or your care partner have signed paperwork which will be entered into your electronic medical record.  These signatures attest to the fact that that the information above on your After Visit Summary has been reviewed and is understood.  Full responsibility of the confidentiality of this discharge information lies with you and/or your care-partner.    Resume previous diet Continue present medications Await pathology results

## 2023-11-08 NOTE — Op Note (Signed)
 [facility] Patient Name: Briana Ochoa Procedure Date: 11/08/2023 2:58 PM MRN: [id number] Endoscopist: Lupita FORBES Commander , MD, [id number] Age: 83 Referring MD:  Date of Birth: 1940-07-02 Gender: Female Account #: [id number] Procedure:                Colonoscopy Indications:              Colorectal cancer, Follow-up of colorectal cancer,                            Abnormal PET scan of the GI tract - has a lung                            lesion that is adenocarcinoma but not certain of                            primary Medicines:                Monitored Anesthesia Care Procedure:                Pre-Anesthesia Assessment:                           - Prior to the procedure, a History and Physical                            was performed, and patient medications and                            allergies were reviewed. The patient's tolerance of                            previous anesthesia was also reviewed. The risks                            and benefits of the procedure and the sedation                            options and risks were discussed with the patient.                            All questions were answered, and informed consent                            was obtained. Prior Anticoagulants: The patient has                            taken no anticoagulant or antiplatelet agents. ASA                            Grade Assessment: III - A patient with severe                            systemic disease. After reviewing the risks and  benefits, the patient was deemed in satisfactory                            condition to undergo the procedure.                           After obtaining informed consent, the colonoscope                            was passed under direct vision. Throughout the                            procedure, the patient's blood pressure, pulse, and                            oxygen saturations were monitored continuously.  The                            Olympus Scope [id number] was introduced through the                            anus and advanced to the the cecum, identified by                            appendiceal orifice and ileocecal valve. The                            colonoscopy was performed without difficulty. The                            patient tolerated the procedure well. The quality                            of the bowel preparation was adequate. The                            ileocecal valve, appendiceal orifice, and rectum                            were photographed. Scope In: 3:13:14 PM Scope Out: 3:31:23 PM Scope Withdrawal Time: 0 hours 15 minutes 42 seconds  Total Procedure Duration: 0 hours 18 minutes 9 seconds  Findings:                 The digital rectal exam findings include decreased                            sphincter tone.                           Five sessile polyps were found in the transverse                            colon and ascending colon. The polyps were 5 to 15  mm in size. These polyps were removed with a cold                            snare. Resection and retrieval were complete.                            Verification of patient identification for the                            specimen was done. Estimated blood loss was minimal.                           There was evidence of a prior end-to-end                            colo-colonic anastomosis in the descending colon.                            This was patent and was characterized by polypoid                            changes left and right. The anastomosis was                            traversed. Biopsies were taken with a cold forceps                            for histology. Verification of patient                            identification for the specimen was done. Estimated                            blood loss was minimal.                           Multiple diverticula  were found in the sigmoid                            colon and one in transverse colon.                           Internal hemorrhoids were found.                           The exam was otherwise without abnormality on                            direct and retroflexion views. Complications:            No immediate complications. Estimated Blood Loss:     Estimated blood loss was minimal. Impression:               - Decreased sphincter tone found on digital rectal  exam.                           - Five 5 to 15 mm polyps in the transverse colon                            and in the ascending colon, removed with a cold                            snare. Resected and retrieved.                           - Patent end-to-end colo-colonic anastomosis,                            characterized by polypoid changes left and right.                            Biopsied.                           - Diverticulosis in the sigmoid and transverse                            colon.                           - Internal hemorrhoids.                           - The examination was otherwise normal on direct                            and retroflexion views. There were diminutiove                            rectal polyps consistent with innovcent                            hyperplastic polyps not removed. Recommendation:           - Patient has a contact number available for                            emergencies. The signs and symptoms of potential                            delayed complications were discussed with the                            patient. Return to normal activities tomorrow.                            Written discharge instructions were provided to the                            patient.                           -  Await pathology results.                           - No routine repeat colonoscopy. Lupita FORBES Commander, MD 11/08/2023 3:47:30 PM This report has been signed  electronically.

## 2023-11-08 NOTE — Op Note (Signed)
  [facility] Patient Name: Briana Ochoa Procedure Date: 11/08/2023 2:59 PM MRN: [id number] Endoscopist: Lupita FORBES Commander , MD, [id number] Age: 83 Referring MD:  Date of Birth: 03/27/1940 Gender: Female Account #: [id number] Procedure:                Upper GI endoscopy Indications:              Abnormal PET scan of the GI tract has colon cancer,                            metastatic but lung lesion is adenocarcinoma but                            not certain it is a colon metastsis Medicines:                Monitored Anesthesia Care Procedure:                Pre-Anesthesia Assessment:                           - Prior to the procedure, a History and Physical                            was performed, and patient medications and                            allergies were reviewed. The patient's tolerance of                            previous anesthesia was also reviewed. The risks                            and benefits of the procedure and the sedation                            options and risks were discussed with the patient.                            All questions were answered, and informed consent                            was obtained. Prior Anticoagulants: The patient has                            taken no anticoagulant or antiplatelet agents. ASA                            Grade Assessment: III - A patient with severe                            systemic disease. After reviewing the risks and                            benefits, the patient was deemed in satisfactory  condition to undergo the procedure.                           After obtaining informed consent, the endoscope was                            passed under direct vision. Throughout the                            procedure, the patient's blood pressure, pulse, and                            oxygen saturations were monitored continuously. The                            Olympus  Scope [id number] was introduced through the                            mouth, and advanced to the second part of duodenum.                            The upper GI endoscopy was accomplished without                            difficulty. The patient tolerated the procedure                            well. Scope In: Scope Out: Findings:                 A small hiatal hernia was present.                           The exam was otherwise without abnormality.                           The cardia and gastric fundus were otherwise normal                            on retroflexion. Complications:            No immediate complications. Estimated Blood Loss:     Estimated blood loss: none. Impression:               - Small hiatal hernia.                           - The examination was otherwise normal.                           - No specimens collected. Recommendation:           - Patient has a contact number available for                            emergencies. The signs and symptoms of potential  delayed complications were discussed with the                            patient. Return to normal activities tomorrow.                            Written discharge instructions were provided to the                            patient.                           - Resume previous diet.                           - Continue present medications.                           - See the other procedure note for documentation of                            additional recommendations. Lupita FORBES Commander, MD 11/08/2023 3:39:58 PM This report has been signed electronically.

## 2023-11-08 NOTE — Progress Notes (Signed)
1518 Ephedrine 10 mg given IV due to low BP, MD updated.

## 2023-11-09 ENCOUNTER — Other Ambulatory Visit: Payer: Self-pay

## 2023-11-10 DIAGNOSIS — C186 Malignant neoplasm of descending colon: Secondary | ICD-10-CM | POA: Diagnosis not present

## 2023-11-10 DIAGNOSIS — C3490 Malignant neoplasm of unspecified part of unspecified bronchus or lung: Secondary | ICD-10-CM | POA: Diagnosis not present

## 2023-11-13 ENCOUNTER — Encounter: Payer: Self-pay | Admitting: Hematology

## 2023-11-13 LAB — SURGICAL PATHOLOGY

## 2023-11-13 NOTE — Progress Notes (Signed)
 The proposed treatment discussed in conference is for discussion purpose only and is not a binding recommendation.  The patients have not been physically examined, or presented with their treatment options.  Therefore, final treatment plans cannot be decided.

## 2023-11-14 LAB — GUARDANT 360

## 2023-11-15 ENCOUNTER — Telehealth: Payer: Self-pay | Admitting: Hematology

## 2023-11-15 ENCOUNTER — Other Ambulatory Visit: Payer: Self-pay

## 2023-11-15 DIAGNOSIS — C186 Malignant neoplasm of descending colon: Secondary | ICD-10-CM

## 2023-11-15 DIAGNOSIS — C3432 Malignant neoplasm of lower lobe, left bronchus or lung: Secondary | ICD-10-CM

## 2023-11-15 LAB — ACID FAST CULTURE WITH REFLEXED SENSITIVITIES (MYCOBACTERIA): Acid Fast Culture: NEGATIVE

## 2023-11-15 NOTE — Telephone Encounter (Signed)
 Briana Ochoa has been contacted and scheduled for her lab only appointment on 10/15 at 3:30.

## 2023-11-15 NOTE — Progress Notes (Signed)
 Verbal order w/readback order from Dr. Lanny for [facility] to be drawn.  Order placed in EPIC and in [facility] portal.  [facility] kt and requisition given to [facility] Lab Receptionist.

## 2023-11-16 ENCOUNTER — Ambulatory Visit: Payer: Self-pay | Admitting: Internal Medicine

## 2023-11-16 ENCOUNTER — Other Ambulatory Visit: Payer: Self-pay

## 2023-11-16 ENCOUNTER — Inpatient Hospital Stay: Attending: Hematology

## 2023-11-16 DIAGNOSIS — Z8049 Family history of malignant neoplasm of other genital organs: Secondary | ICD-10-CM | POA: Insufficient documentation

## 2023-11-16 DIAGNOSIS — C3412 Malignant neoplasm of upper lobe, left bronchus or lung: Secondary | ICD-10-CM | POA: Insufficient documentation

## 2023-11-16 DIAGNOSIS — Z85038 Personal history of other malignant neoplasm of large intestine: Secondary | ICD-10-CM | POA: Insufficient documentation

## 2023-11-16 DIAGNOSIS — Z79899 Other long term (current) drug therapy: Secondary | ICD-10-CM | POA: Insufficient documentation

## 2023-11-16 DIAGNOSIS — C186 Malignant neoplasm of descending colon: Secondary | ICD-10-CM

## 2023-11-16 DIAGNOSIS — C3432 Malignant neoplasm of lower lobe, left bronchus or lung: Secondary | ICD-10-CM

## 2023-11-16 DIAGNOSIS — E538 Deficiency of other specified B group vitamins: Secondary | ICD-10-CM | POA: Insufficient documentation

## 2023-11-16 DIAGNOSIS — Z5111 Encounter for antineoplastic chemotherapy: Secondary | ICD-10-CM | POA: Insufficient documentation

## 2023-11-16 DIAGNOSIS — Z8051 Family history of malignant neoplasm of kidney: Secondary | ICD-10-CM | POA: Insufficient documentation

## 2023-11-16 DIAGNOSIS — C7951 Secondary malignant neoplasm of bone: Secondary | ICD-10-CM | POA: Insufficient documentation

## 2023-11-16 DIAGNOSIS — Z5112 Encounter for antineoplastic immunotherapy: Secondary | ICD-10-CM | POA: Insufficient documentation

## 2023-11-16 DIAGNOSIS — Z79620 Long term (current) use of immunosuppressive biologic: Secondary | ICD-10-CM | POA: Insufficient documentation

## 2023-11-16 DIAGNOSIS — Z800 Family history of malignant neoplasm of digestive organs: Secondary | ICD-10-CM | POA: Insufficient documentation

## 2023-11-16 DIAGNOSIS — Z86000 Personal history of in-situ neoplasm of breast: Secondary | ICD-10-CM | POA: Insufficient documentation

## 2023-11-16 DIAGNOSIS — Z803 Family history of malignant neoplasm of breast: Secondary | ICD-10-CM | POA: Insufficient documentation

## 2023-11-16 DIAGNOSIS — Z8551 Personal history of malignant neoplasm of bladder: Secondary | ICD-10-CM | POA: Insufficient documentation

## 2023-11-16 DIAGNOSIS — Z923 Personal history of irradiation: Secondary | ICD-10-CM | POA: Insufficient documentation

## 2023-11-16 LAB — MISCELLANEOUS TEST

## 2023-11-16 NOTE — Telephone Encounter (Signed)
 I called the patient and explained that polyps are benign.  She does have sessile serrated polyp growth at her anastomosis.  I do not think this is an issue for her and would not pursue removal unless we thought it was going to alter her overall course.  She currently has either a metastatic lesion or new primary in the lung that is adenocarcinoma.  Will communicate with Dr. Lanny.

## 2023-11-20 ENCOUNTER — Inpatient Hospital Stay: Admitting: Hematology

## 2023-11-20 DIAGNOSIS — C3490 Malignant neoplasm of unspecified part of unspecified bronchus or lung: Secondary | ICD-10-CM | POA: Diagnosis not present

## 2023-11-20 DIAGNOSIS — C7951 Secondary malignant neoplasm of bone: Secondary | ICD-10-CM | POA: Diagnosis not present

## 2023-11-20 MED ORDER — ONDANSETRON HCL 8 MG PO TABS: 8.0000 mg | ORAL_TABLET | Freq: Three times a day (TID) | ORAL | 1 refills | Status: AC | PRN

## 2023-11-20 MED ORDER — PROCHLORPERAZINE MALEATE 10 MG PO TABS: 10.0000 mg | ORAL_TABLET | Freq: Four times a day (QID) | ORAL | 1 refills | Status: DC | PRN

## 2023-11-20 MED ORDER — FOLIC ACID 1 MG PO TABS: 1.0000 mg | ORAL_TABLET | Freq: Every day | ORAL | 3 refills | Status: AC

## 2023-11-20 NOTE — Assessment & Plan Note (Signed)
-  Diagnosed in 09/2023, stage IV with bone mets, KRAS G12V mutation (+) -.  During her CT surveillance for her colon cancer in 2022, she was found to have multiple lung nodules.  The left upper lung nodule size slightly increased in 2023, she underwent bronchoscopy biopsy which was negative.  Due to the further increase in the size in 2025, she had a repeated bronchoscopy biopsy in August 2025, which confirmed adenocarcinoma.  The immunostain studies do not support colorectal primary, and a favor lung or upper GI primary.  EGD and colonoscopy were negative for malignancy. -PET scan in September 2025 unfortunately showed a oligo bone metastasis in right scapula.  She was referred to see thoracic surgeon Dr. Kerrin, surgery was not offered due to the metastatic disease. -Guardant360 was negative for targetable mutation -I recommend chemotherapy with single agent pemetrexed, and Keytruda  if PD-L1 expression more than 1%. -

## 2023-11-20 NOTE — Progress Notes (Signed)
 START ON PATHWAY REGIMEN - Non-Small Cell Lung     A cycle is every 21 days:     Pemetrexed      Carboplatin   **Always confirm dose/schedule in your pharmacy ordering system**  Patient Characteristics: Stage IV Metastatic, Nonsquamous, Molecular Analysis Completed, Molecular Alteration Present and Targeted Therapy Exhausted, OR EGFR/KRAS/HER2/NRG1/c-Met Present and Standard Chemotherapy/Immunotherapy Planned, OR No Alteration Present, Initial  Chemotherapy/Immunotherapy, PS = 2, No Alteration Present, No Alteration Present, PD-L1 Expression Positive 1-49% (TPS) / Negative / Not Tested / Awaiting Test Results Therapeutic Status: Stage IV Metastatic Histology: Nonsquamous Cell Broad Molecular Profiling Status: Molecular Analysis Completed Molecular Analysis Results: No Alteration Present ECOG Performance Status: 2 Chemotherapy/Immunotherapy Line of Therapy: Initial Chemotherapy/Immunotherapy EGFR Exons 18-21 Mutation Testing Status: Completed and Negative c-Met Overexpression (EGFR Wildtype) Testing Status: Completed and Negative ALK Fusion/Rearrangement Testing Status: Completed and Negative BRAF V600 Mutation Testing Status: Completed and Negative KRAS G12C Mutation Testing Status: Completed and Negative MET Exon 14 Mutation Testing Status: Completed and Negative RET Fusion/Rearrangement Testing Status: Completed and Negative NRG1 Fusion/Rearrangement Testing Status: Completed and Negative HER2 Mutation Testing Status: Completed and Negative NTRK Fusion/Rearrangement Testing Status: Completed and Negative ROS1 Fusion/Rearrangement Testing Status: Completed and Negative PD-L1 Expression Status: Awaiting Test Results Intent of Therapy: Non-Curative / Palliative Intent, Discussed with Patient

## 2023-11-20 NOTE — Progress Notes (Signed)
 [facility]   [phone] [phone]   Clinic Follow up Note   Patient Care Team: Nche, Roselie Rockford, NP as PCP - General (Internal Medicine) Odean Potts, MD as Consulting Physician (Hematology and Oncology) Rosemarie Eather RAMAN, MD as Consulting Physician (Neurology) Lanis Pupa, MD as Consulting Physician (Neurosurgery) Charmayne Molly, MD as Consulting Physician (Ophthalmology) Lanny Callander, MD as Consulting Physician (Hematology and Oncology) Debby Hila, MD as Consulting Physician (General Surgery) 11/20/2023  I connected with Briana Ochoa on 11/20/23 at  3:30 PM EDT by telephone and verified that I am speaking with the correct person using two identifiers.   I discussed the limitations, risks, security and privacy concerns of performing an evaluation and management service by telephone and the availability of in person appointments. I also discussed with the patient that there may be a patient responsible charge related to this service. The patient expressed understanding and agreed to proceed.   Patient's location:  Home  Provider's location:  Office    CHIEF COMPLAINT: f/u metastatic non-small cell lung cancer   CURRENT THERAPY: Pending chemotherapy  Oncology history NSCLC metastatic to bone (HCC) -Diagnosed in 09/2023, stage IV with bone mets, KRAS G12V mutation (+) -.  During her CT surveillance for her colon cancer in 2022, she was found to have multiple lung nodules.  The left upper lung nodule size slightly increased in 2023, she underwent bronchoscopy biopsy which was negative.  Due to the further increase in the size in 2025, she had a repeated bronchoscopy biopsy in August 2025, which confirmed adenocarcinoma.  The immunostain studies do not support colorectal primary, and a favor lung or upper GI primary.  EGD and colonoscopy were negative for malignancy. -PET scan in September 2025 unfortunately showed a oligo bone  metastasis in right scapula.  She was referred to see thoracic surgeon Dr. Kerrin, surgery was not offered due to the metastatic disease. -Guardant360 was negative for targetable mutation -I recommend chemotherapy with single agent pemetrexed, and Keytruda  if PD-L1 expression more than 1%. -  Assessment & Plan Metastatic non-small cell lung cancer Newly diagnosed metastatic non-small cell lung cancer with KRAS mutation. No targetable mutations identified in blood test (Guardant 360). Awaiting tissue-based test results for immunotherapy potential. - Start single-agent Alimta chemotherapy in two weeks due to her preference for maintaining work commitments and quality of life. - Request insurance approval for Keytruda , pending tissue-based test results. - Administer B12 injection and advise daily folic acid  supplementation. - Schedule chemotherapy sessions based on availability.  Plan - Reviewed and the biopsy results, and the Guardant360 results - I recommend single agent pemetrexed every 3 weeks, and Keytruda  if PD-L1 positive (result pending) - Plan to start chemotherapy in about 2 weeks.  She is still taking care of her daughter who just had a surgery.   SUMMARY OF ONCOLOGIC HISTORY: Oncology History Overview Note  Cancer Staging Cancer of left colon [facility]) Staging form: Colon and Rectum, AJCC 8th Edition - Pathologic stage from 05/28/2020: Stage IIA (pT3, pN0, cM0) - Signed by Lanny Callander, MD on 06/19/2020 Stage prefix: Initial diagnosis Total positive nodes: 0 Histologic grading system: 4 grade system Histologic grade (G): G3 Residual tumor (R): R0 - None  Ductal carcinoma in situ (DCIS) of left breast Staging form: Breast, AJCC 7th Edition - Clinical: Stage 0 (Tis (DCIS), N0, cM0) - Unsigned Specimen type: Core Needle Biopsy Histopathologic type: 9932 Laterality: Left Staging comments: Staged at breast conference 7.22.15  - Pathologic: Stage 0 (  Tis (DCIS)(2), N0, cM0) -  Unsigned Specimen type: Core Needle Biopsy Histopathologic type: 9932 Laterality: Left Tumor size (mm): 3 Multiple tumors: Yes Number of tumors: 2 Method of lymph node assessment: Clinical Method of detection of distant metastases: Clinical Residual tumor (R): R0 - None Estrogen receptor status: Positive Progesterone receptor status: Positive    Ductal carcinoma in situ (DCIS) of left breast  08/20/2013 Initial Diagnosis   Left breast 12:00: DCIS with calcifications, yet 100%, PR 100%; 09/04/2013 second group of calcifications also biopsied proven to be DCIS ER/PR positive   09/19/2013 Surgery   Left breast lumpectomy DCIS 2 foci margins negative, 0.2 and 0.3 cm ER 100% PR 100%   10/08/2013 - 11/12/2013 Radiation Therapy   Adjuvant radiation therapy   11/12/2013 - 2020 Anti-estrogen oral therapy   Anastrozole  1 mg daily plan is for 5 years   01/09/2014 - 01/10/2014 Hospital Admission   Pipeline embolization 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 The report date is 07/30/2020.   The CancerNext-Expanded + RNAinsight gene panel offered by [employer] and includes sequencing and rearrangement analysis for the following 77 genes: AIP, ALK, APC, ATM, AXIN2, BAP1, BARD1, BLM, BMPR1A, BRCA1, BRCA2, BRIP1, CDC73, CDH1, CDK4, CDKN1B, CDKN2A, CHEK2, CTNNA1, DICER1, FANCC, FH, FLCN, GALNT12, KIF1B, LZTR1, MAX, MEN1, MET, MLH1, MSH2, MSH3, MSH6, MUTYH, NBN, NF1, NF2, NTHL1, PALB2, PHOX2B, PMS2, POT1, PRKAR1A, PTCH1, PTEN, RAD51C, RAD51D, RB1, RECQL, RET, SDHA, SDHAF2, SDHB, SDHC, SDHD, SMAD4, SMARCA4, SMARCB1, SMARCE1, STK11, SUFU, TMEM127, TP53, TSC1, TSC2, VHL and XRCC2 (sequencing and deletion/duplication); EGFR, EGLN1, HOXB13, KIT, MITF, PDGFRA, POLD1 and POLE (sequencing only); EPCAM and GREM1 (deletion/duplication only). RNA data is routinely analyzed for use in  variant interpretation for all genes.   Bladder cancer (HCC)  02/09/2018 Initial Diagnosis   Bladder cancer (HCC)   07/30/2020 Genetic Testing   Negative genetic testing:  No pathogenic variants detected on the [employer] CancerNext-Expanded + RNAinsight panel. The report date is 07/30/2020.   The CancerNext-Expanded + RNAinsight gene panel offered by [employer] and includes sequencing and rearrangement analysis for the following 77 genes: AIP, ALK, APC, ATM, AXIN2, BAP1, BARD1, BLM, BMPR1A, BRCA1, BRCA2, BRIP1, CDC73, CDH1, CDK4, CDKN1B, CDKN2A, CHEK2, CTNNA1, DICER1, FANCC, FH, FLCN, GALNT12, KIF1B, LZTR1, MAX, MEN1, MET, MLH1, MSH2, MSH3, MSH6, MUTYH, NBN, NF1, NF2, NTHL1, PALB2, PHOX2B, PMS2, POT1, PRKAR1A, PTCH1, PTEN, RAD51C, RAD51D, RB1, RECQL, RET, SDHA, SDHAF2, SDHB, SDHC, SDHD, SMAD4, SMARCA4, SMARCB1, SMARCE1, STK11, SUFU, TMEM127, TP53, TSC1, TSC2, VHL and XRCC2 (sequencing and deletion/duplication); EGFR, EGLN1, HOXB13, KIT, MITF, PDGFRA, POLD1 and POLE (sequencing only); EPCAM and GREM1 (deletion/duplication only). RNA data is routinely analyzed for use in variant interpretation for all genes.   08/31/2020 Imaging   CT CAP w/o contrast  IMPRESSION: 1. No specific findings of active malignancy on today's noncontrast CT. There has been interval partial colectomy involving the tumor site with reanastomosis. Some faint stranding in the adjacent omentum is probably from scarring or fat necrosis, less likely due to early tumor given the lack of overt nodularity. Surveillance imaging is likely indicated, and PET-CT may have a role in imaging follow up. 2. Several small pulmonary nodules, in addition to a larger mixed density nodule in the left upper lobe. Surveillance is recommended. 3. Other imaging findings of potential clinical significance: Aortic Atherosclerosis (ICD10-I70.0). Coronary atherosclerosis. Mitral valve calcification. Small type 1 hiatal hernia. Splenectomy with small  amount of regenerative splenic tissue as well as a venous varix arising from the splenic vein. Hyperdense left kidney lower pole exophytic homogeneous lesion, probably a complex cyst. Sigmoid colon diverticulosis. Multilevel lumbar spondylosis and degenerative disc disease.   Cancer of left colon (HCC)  04/23/2020 Procedure   Upper Endoscopy and Colonoscopy by Dr Avram IMPRESSION Erythematous mucosa in the antrum. Biopsied. - Small hiatal hernia. - Gastroesophageal flap valve classified as Hill Grade IV (no fold, wide open lumen, hiatal hernia present). - The examination was otherwise normal.   IMPRESSION - Decreased sphincter tone found on digital rectal exam. - Malignant partially obstructing tumor in the proximal descending colon. Biopsied. Tattooed. - Three 5 to 20 mm polyps in the sigmoid colon, removed piecemeal using a cold snare. Resected and retrieved. - Severe diverticulosis in the sigmoid colon. - I left several diminutive descending (proximal to tattoo), sigmoid and rectal polyps - The examination was otherwise normal on direct and retroflexion views.     04/23/2020 Initial Diagnosis   Diagnosis 1. Descending Colon Polyp, mass - ADENOCARCINOMA. 2. Sigmoid Colon Polyp, (3) - MULTIPLE FRAGMENTS OF TUBULAR ADENOMA. - MULTIPLE FRAGMENTS OF SESSILE SERRATED POLYP WITHOUT DYSPLASIA. - NO HIGH GRADE DYSPLASIA OR MALIGNANCY. Microscopic Comment 1. Dr. Rebbecca has reviewed the case. Dr. Avram was notified on 04/24/2020. 2. Despite being left sided the fragments have features of a sessile serrated polyp, as opposed to, hyperplastic polyp.   04/27/2020 Imaging   CT AP IMPRESSION: Bulky annular soft tissue mass involving the distal transverse colon with extension into adjacent pericolonic fat, consistent with primary colon carcinoma.   No evidence of metastatic disease.   Colonic diverticulosis. No radiographic evidence of diverticulitis.   Aortic Atherosclerosis  (ICD10-I70.0).     05/05/2020 Imaging   CT Chest IMPRESSION: Bulky annular soft tissue mass involving the distal transverse colon with extension into adjacent pericolonic fat, consistent with primary colon carcinoma.   No evidence of metastatic disease.   Colonic diverticulosis. No radiographic evidence of diverticulitis.   Aortic Atherosclerosis (ICD10-I70.0).     05/28/2020 Initial Diagnosis   Cancer of left colon (HCC)   05/28/2020 Cancer Staging   Staging form: Colon and Rectum, AJCC 8th Edition - Pathologic stage from 05/28/2020: Stage IIA (pT3, pN0, cM0) - Signed by Lanny Callander, MD on 06/19/2020 Stage prefix: Initial diagnosis Total positive nodes: 0 Histologic grading system: 4 grade system Histologic grade (G): G3 Residual tumor (R): R0 - None   05/28/2020 Surgery   XI ROBOT ASSISTED RESECTION OF SPLENIC FLEXURE by Dr Debby    FINAL MICROSCOPIC DIAGNOSIS:   A. COLON, SPLENIC FLEXURE, RESECTION:  - Invasive adenocarcinoma, poorly differentiated, spanning 9.7 cm.  - Tumor invades through muscularis propria into pericolonic tissue.  - Resection margins are negative.  - Tubular adenoma (X1).  - Sessile serrate polyp without dysplasia (x2).  - Diverticulosis.  - Polypectomy scar.  - Twenty-four of twenty-four lymph nodes negative for carcinoma (0/24).  - See oncology table.    07/07/2020 Miscellaneous   Guardant Reveal ctDNA positive MSI high   07/30/2020 Genetic Testing   Negative genetic testing:  No pathogenic variants detected on the [employer] CancerNext-Expanded + RNAinsight panel. The report date is 07/30/2020.   The CancerNext-Expanded + RNAinsight gene panel offered by [employer] and includes sequencing and rearrangement analysis for the following 77 genes: AIP, ALK, APC, ATM, AXIN2, BAP1, BARD1, BLM, BMPR1A, BRCA1, BRCA2, BRIP1, CDC73, CDH1, CDK4, CDKN1B, CDKN2A, CHEK2, CTNNA1, DICER1, FANCC, FH, FLCN, GALNT12, KIF1B, LZTR1, MAX,  MEN1, MET, MLH1, MSH2, MSH3,  MSH6, MUTYH, NBN, NF1, NF2, NTHL1, PALB2, PHOX2B, PMS2, POT1, PRKAR1A, PTCH1, PTEN, RAD51C, RAD51D, RB1, RECQL, RET, SDHA, SDHAF2, SDHB, SDHC, SDHD, SMAD4, SMARCA4, SMARCB1, SMARCE1, STK11, SUFU, TMEM127, TP53, TSC1, TSC2, VHL and XRCC2 (sequencing and deletion/duplication); EGFR, EGLN1, HOXB13, KIT, MITF, PDGFRA, POLD1 and POLE (sequencing only); EPCAM and GREM1 (deletion/duplication only). RNA data is routinely analyzed for use in variant interpretation for all genes.   08/31/2020 Imaging   CT CAP w/o contrast  IMPRESSION: 1. No specific findings of active malignancy on today's noncontrast CT. There has been interval partial colectomy involving the tumor site with reanastomosis. Some faint stranding in the adjacent omentum is probably from scarring or fat necrosis, less likely due to early tumor given the lack of overt nodularity. Surveillance imaging is likely indicated, and PET-CT may have a role in imaging follow up. 2. Several small pulmonary nodules, in addition to a larger mixed density nodule in the left upper lobe. Surveillance is recommended. 3. Other imaging findings of potential clinical significance: Aortic Atherosclerosis (ICD10-I70.0). Coronary atherosclerosis. Mitral valve calcification. Small type 1 hiatal hernia. Splenectomy with small amount of regenerative splenic tissue as well as a venous varix arising from the splenic vein. Hyperdense left kidney lower pole exophytic homogeneous lesion, probably a complex cyst. Sigmoid colon diverticulosis. Multilevel lumbar spondylosis and degenerative disc disease.   10/02/2020 - 09/17/2021 Chemotherapy   Patient is on Treatment Plan : COLORECTAL Pembrolizumab  q21d     03/01/2021 Imaging   EXAM: CT CHEST, ABDOMEN AND PELVIS WITHOUT CONTRAST  IMPRESSION: 1. Mixed attenuation lesion posterior left upper lobe has areas of more confluent and enlarging nodularity today. Interval progression raises concern for neoplasm. Consider PET-CT  to further evaluate. 2. Stable 7 mm right lower lobe pulmonary nodule with a slightly more conspicuous 3 mm nodule in the right lower lobe. Continued close attention on follow-up recommended. 3. No evidence for metastatic disease in the abdomen or pelvis. 4. Aortic Atherosclerosis (ICD10-I70.0).   03/17/2021 PET scan   IMPRESSION: 1. Part solid nodule of the left upper lobe demonstrates mild FDG uptake, and has increased in size when compared with prior exam dated May 05, 2020. Concerning for indolent primary lung neoplasm. 2. No evidence of metastatic disease in the chest, abdomen or pelvis. 3. 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 Recommend further evaluation with tissue sampling. 4. Aortic Atherosclerosis (ICD10-I70.0).   03/15/2022 Imaging    IMPRESSION: Stable irregular solid pulmonary nodule in posterior left upper lobe.   No evidence of recurrent or metastatic disease within the abdomen or pelvis.   Colonic diverticulosis, without radiographic evidence of diverticulitis.   NSCLC metastatic to bone [facility])  10/02/2023 Cancer Staging   Staging form: Lung, AJCC V9 - Clinical stage from 10/02/2023: cT1b, cN1, cM1 - Signed by Lanny Callander, MD on 11/20/2023   11/20/2023 Initial Diagnosis   NSCLC metastatic to bone (HCC)   12/04/2023 -  Chemotherapy   Patient is on Treatment Plan : Lung Pemetrexed (500) + Pembrolizumab  (200) D1 q21d       Discussed the use of AI scribe software for clinical note transcription with the patient, who gave verbal consent to proceed.  History of Present Illness Briana Ochoa is an 83 year old female with metastatic non-small cell lung cancer who presents for a phone visit to review test results. She is accompanied by her daughter, Shona, who is recovering from surgery.  She has been newly diagnosed with  metastatic non-small cell lung cancer, with a KRAS mutation identified in a recent  liquid biopsy. Results from a tissue-based test are pending. She is considering chemotherapy with carboplatin and Alimta, administered every three weeks for four cycles, followed by maintenance therapy. She is concerned about the impact of treatment on her work schedule, as she is employed part-time at [employer].  She is awaiting further test results to determine the suitability of immunotherapy, having previously tolerated Keytruda  well, except for taste changes. She is currently taking B12 supplements weekly, with a B12 level of 771, which is within normal range.     REVIEW OF SYSTEMS:   Constitutional: Denies fevers, chills or abnormal weight loss Eyes: Denies blurriness of vision Ears, nose, mouth, throat, and face: Denies mucositis or sore throat Respiratory: Denies cough, dyspnea or wheezes Cardiovascular: Denies palpitation, chest discomfort or lower extremity swelling Gastrointestinal:  Denies nausea, heartburn or change in bowel habits Skin: Denies abnormal skin rashes Lymphatics: Denies new lymphadenopathy or easy bruising Neurological:Denies numbness, tingling or new weaknesses Behavioral/Psych: Mood is stable, no new changes  All other systems were reviewed with the patient and are negative.  MEDICAL HISTORY:  Past Medical History:  Diagnosis Date   Allergy    seasonal allergies   Anemia    on meds   Aortic atherosclerosis    Bladder cancer (HCC) 2020   Bladder tumor    Blood transfusion without reported diagnosis    Bloody diarrhea 09/29/2017   Breast cancer (HCC) 2015   Left Breast Cancer   Cataract    bilateral -sx   CKD (chronic kidney disease), stage III (HCC)    DM related   Colon cancer (HCC)    Diabetes mellitus type 2, diet-controlled (HCC)    diet controlled   Dyspnea 09/28/2017   Dysuria    Family history of adverse reaction to anesthesia    aunt- N/V    Family history of breast cancer    Family history of kidney cancer    Family history of throat  cancer    Family history of uterine cancer    Fatigue 09/28/2017   Frequency of urination    Grade I diastolic dysfunction 08/06/2014   Noted on ECHO   Gross hematuria 09/29/2017   Heart murmur    since rheumatic fever as child   Hematuria    History of cardiomegaly 02/12/2005   Mild, noted on CXR   History of CVA (cerebrovascular accident) 08/05/2014   post op cerebral angiogram, cerebral thrombosis w/ cerebral infarction;  11-02-2017  per pt no residuals   History of malignant melanoma of skin 12/2015   s/p wide local excision right lower leg (per pt localized)   History of rheumatic fever as a child    Hyperlipidemia    on meds   Hypertension    on meds   Intracranial aneurysm dx 07/ 2015   paraophthalmic RICA aneurysm /   s/p  Pipeline embolization right ICA 01-09-2014   Jaundice 09/28/2017   Malignant neoplasm of central portion of left breast in female, estrogen receptor positive [facility]) oncologist-  dr odean   dx 07/ 2015--- DCIS, ER/PR positive-- s/p  left breast lumpectomy 09-19-2013,  completed radiation 11-12-2013,  started arimidex  11-12-2013   Melanoma (HCC)    righ tleg    OA (osteoarthritis)    knees, hands, R shoulder   Osteopenia 10/16/2012   Diffuse   Personal history of radiation therapy    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   Sigmoid diverticulosis    Stroke (HCC)    Weakness 09/28/2017   Wears dentures    upper   Wears glasses    Wears hearing aid in both ears     SURGICAL HISTORY: Past Surgical History:  Procedure Laterality Date   BREAST LUMPECTOMY Left 2015   BREAST LUMPECTOMY WITH NEEDLE LOCALIZATION Left 09/19/2013   Procedure: LEFT BREAST LUMPECTOMY WITH DOUBLE WIRE BRACKETED  NEEDLE LOCALIZATION;  Surgeon: Elon CHRISTELLA Pacini, MD;  Location: [facility] OR;  Service: General;  Laterality: Left;   BRONCHIAL BIOPSY  04/06/2021   Procedure: BRONCHIAL BIOPSIES;  Surgeon: Brenna Adine CROME, DO;  Location: [facility] ENDOSCOPY;  Service: Pulmonary;;    BRONCHIAL BRUSHINGS  04/06/2021   Procedure: BRONCHIAL BRUSHINGS;  Surgeon: Brenna Adine CROME, DO;  Location: [facility];  Service: Pulmonary;;   CATARACT EXTRACTION W/ INTRAOCULAR LENS  IMPLANT, BILATERAL  06/2015   CESAREAN SECTION  1964   CHOLECYSTECTOMY OPEN  1970s   CYSTOSCOPY W/ URETERAL STENT PLACEMENT Left 11/07/2017   Procedure: CYSTOSCOPY WITH RETROGRADE PYELOGRAM/URETERAL STENT PLACEMENT;  Surgeon: Sherrilee Belvie CROME, MD;  Location: [facility];  Service: Urology;  Laterality: Left;   CYSTOSCOPY W/ URETERAL STENT PLACEMENT Bilateral 04/27/2018   Procedure: CYSTOSCOPY WITH RETROGRADE PYELOGRAM;  Surgeon: Sherrilee Belvie CROME, MD;  Location[facility];  Service: Urology;  Laterality: Bilateral;   FINGER SURGERY     lt thumb  from dog bite   IR  NEPHROURETERAL CATH PLACE LEFT  04/28/2018   MELANOMA EXCISION Right 12/28/2015   Procedure: EXCISION MELANOMA RIGHT LOWER LEG;  Surgeon: Elon Pacini, MD;  Location: [facility];  Service: General;  Laterality: Right;   RADIOLOGY WITH ANESTHESIA N/A 01/09/2014   Procedure: Embolization;  Surgeon: Gerldine Maizes, MD;  Location: [facility] OR;  Service: Radiology;  Laterality: N/A;   SPLENECTOMY, TOTAL  2009 approx.   splen injury due to fall off horse   TONSILLECTOMY  child   TRANSURETHRAL RESECTION OF BLADDER TUMOR N/A 11/07/2017   Procedure: TRANSURETHRAL RESECTION OF BLADDER TUMOR (TURBT), POSSIBLE STENT PLACEMENT;  Surgeon: Sherrilee Belvie CROME, MD;  Location: [facility];  Service: Urology;  Laterality: N/A;   TRANSURETHRAL RESECTION OF BLADDER TUMOR N/A 12/14/2017   Procedure: TRANSURETHRAL RESECTION OF BLADDER TUMOR (TURBT);  Surgeon: Sherrilee Belvie CROME, MD;  Location: [facility];  Service: Urology;  Laterality: N/A;  30 MINS   TRANSURETHRAL RESECTION OF BLADDER TUMOR N/A 04/27/2018   Procedure: TRANSURETHRAL RESECTION OF BLADDER TUMOR (TURBT);  Surgeon: Sherrilee Belvie CROME, MD;  Location: [facility];  Service: Urology;  Laterality: N/A;  30 MINS   VIDEO BRONCHOSCOPY WITH ENDOBRONCHIAL NAVIGATION Bilateral 10/02/2023   Procedure: VIDEO BRONCHOSCOPY WITH ENDOBRONCHIAL NAVIGATION;  Surgeon: Shelah Lamar RAMAN, MD;  Location: [facility] ENDOSCOPY;  Service: Pulmonary;  Laterality: Bilateral;   VIDEO BRONCHOSCOPY WITH RADIAL ENDOBRONCHIAL ULTRASOUND  04/06/2021   Procedure: RADIAL ENDOBRONCHIAL ULTRASOUND;  Surgeon: Brenna Adine CROME, DO;  Location: [facility];  Service: Pulmonary;;    I have reviewed the social history and family history with the patient and they are unchanged from previous note.  ALLERGIES:  is allergic to feraheme  [ferumoxytol ].  MEDICATIONS:  Current Outpatient Medications  Medication Sig Dispense Refill   acetaminophen  (TYLENOL ) 500 MG tablet Take 1,000 mg by mouth daily.     aspirin  EC 81 MG tablet Take 81 mg by mouth daily. Swallow whole.     Cyanocobalamin  (VITAMIN B 12 PO)  Take 1 tablet by mouth 3 (three) times a week. Take 1 tablet by mouth 3 times a week. Take on Monday, Wednesday and Friday     folic acid  (FOLVITE ) 1 MG tablet Take 1 tablet (1 mg total) by mouth daily. Start 7 days before pemetrexed chemotherapy. Continue until 21 days after pemetrexed completed. 100 tablet 3   glucose blood (TRUE METRIX BLOOD GLUCOSE TEST) test strip Use as instructed to test blood sugar once daily E08.638 100 each 2   lisinopril  (ZESTRIL ) 20 MG tablet Take 1 tablet (20 mg total) by mouth daily. 90 tablet 3   ondansetron  (ZOFRAN ) 8 MG tablet Take 1 tablet (8 mg total) by mouth every 8 (eight) hours as needed for nausea or vomiting. Start on the third day after carboplatin. 30 tablet 1   pravastatin  (PRAVACHOL ) 20 MG tablet Take 1 tablet (20 mg total) by mouth every evening. 90 tablet 1   prochlorperazine (COMPAZINE) 10 MG tablet Take 1 tablet (10 mg total) by mouth every 6 (six) hours as needed for nausea or vomiting. 30 tablet 1   No  current facility-administered medications for this visit.    PHYSICAL EXAMINATION: Not performed   LABORATORY DATA:  I have reviewed the data as listed    Latest Ref Rng & Units 11/03/2023   11:04 AM 10/02/2023    6:04 AM 08/22/2023    3:27 PM  CBC  WBC 4.0 - 10.5 K/uL 9.8  9.0  9.8   Hemoglobin 12.0 - 15.0 g/dL 84.5  84.8  85.4   Hematocrit 36.0 - 46.0 % 46.6  46.3  44.0   Platelets 150 - 400 K/uL 314  274  270         Latest Ref Rng & Units 11/03/2023   11:04 AM 10/02/2023    6:04 AM 08/22/2023    3:27 PM  CMP  Glucose 70 - 99 mg/dL 870  857  864   BUN 8 - 23 mg/dL 27  26  28    Creatinine 0.44 - 1.00 mg/dL 8.32  8.33  8.40   Sodium 135 - 145 mmol/L 140  138  140   Potassium 3.5 - 5.1 mmol/L 5.6  4.8  4.6   Chloride 98 - 111 mmol/L 106  109  107   CO2 22 - 32 mmol/L 29  23  26    Calcium  8.9 - 10.3 mg/dL 9.7  9.1  9.5   Total Protein 6.5 - 8.1 g/dL 7.0  6.4  6.8   Total Bilirubin 0.0 - 1.2 mg/dL 0.3  0.6  0.3   Alkaline Phos 38 - 126 U/L 100  77  89   AST 15 - 41 U/L 11  16  13    ALT 0 - 44 U/L 7  12  8        RADIOGRAPHIC STUDIES: I have personally reviewed the radiological images as listed and agreed with the findings in the report. No results found.     I discussed the assessment and treatment plan with the patient. The patient was provided an opportunity to ask questions and all were answered. The patient agreed with the plan and demonstrated an understanding of the instructions.   The patient was advised to call back or seek an in-person evaluation if the symptoms worsen or if the condition fails to improve as anticipated.  I provided 45 minutes of non face-to-face telephone visit time during this encounter, including review of chart and various tests results, discussions about plan of  care and coordination of care plan.    Onita Mattock, MD 11/20/23

## 2023-11-21 ENCOUNTER — Other Ambulatory Visit: Payer: Self-pay

## 2023-11-21 ENCOUNTER — Telehealth: Payer: Self-pay | Admitting: Hematology

## 2023-11-21 DIAGNOSIS — C3412 Malignant neoplasm of upper lobe, left bronchus or lung: Secondary | ICD-10-CM | POA: Diagnosis not present

## 2023-11-21 NOTE — Telephone Encounter (Signed)
 Briana Ochoa is scheduled and aware of her injection appointment scheduled for 10/25.

## 2023-11-22 ENCOUNTER — Other Ambulatory Visit: Payer: Self-pay | Admitting: Hematology

## 2023-11-22 ENCOUNTER — Other Ambulatory Visit

## 2023-11-22 ENCOUNTER — Telehealth: Payer: Self-pay | Admitting: Hematology

## 2023-11-22 DIAGNOSIS — C3490 Malignant neoplasm of unspecified part of unspecified bronchus or lung: Secondary | ICD-10-CM

## 2023-11-22 NOTE — Telephone Encounter (Signed)
 Briana Ochoa called in to re schedule her injection appointment.

## 2023-11-22 NOTE — Telephone Encounter (Signed)
 Briana Ochoa has been contacted and made aware of her scheduled appts on 10/27.

## 2023-11-27 ENCOUNTER — Ambulatory Visit: Admitting: Nurse Practitioner

## 2023-11-27 ENCOUNTER — Encounter: Payer: Self-pay | Admitting: Nurse Practitioner

## 2023-11-27 ENCOUNTER — Ambulatory Visit: Payer: Self-pay

## 2023-11-27 VITALS — BP 126/74 | HR 72 | Temp 97.5°F | Ht 64.0 in | Wt 194.8 lb

## 2023-11-27 DIAGNOSIS — R600 Localized edema: Secondary | ICD-10-CM | POA: Diagnosis not present

## 2023-11-27 DIAGNOSIS — L03116 Cellulitis of left lower limb: Secondary | ICD-10-CM | POA: Diagnosis not present

## 2023-11-27 MED ORDER — DOXYCYCLINE HYCLATE 100 MG PO TABS: 100.0000 mg | ORAL_TABLET | Freq: Two times a day (BID) | ORAL | 0 refills | Status: AC

## 2023-11-27 NOTE — Patient Instructions (Signed)
 Use knee high or thigh high compression stocking during the day and off at night Start doxycycline 1tabd 2x/day with food. You will be contacted to schedule appointment for venous ultrasound

## 2023-11-27 NOTE — Telephone Encounter (Signed)
 FYI Only or Action Required?: FYI only for provider.  Patient was last seen in primary care on 10/18/2023 by Nche, Roselie Rockford, NP.  Called Nurse Triage reporting Leg Injury.  Symptoms began several months ago.  Interventions attempted: Rest, hydration, or home remedies.  Symptoms are: gradually worsening.  Triage Disposition: See PCP When Office is Open (Within 3 Days)  Patient/caregiver understands and will follow disposition?: Yes         Copied from CRM #[id number]. Topic: Clinical - Red Word Triage >> Nov 27, 2023  8:37 AM Vena HERO wrote: Red Word that prompted transfer to Nurse Triage: pain, small cut on leg and is now painful and red like possible infection Reason for Disposition  [1] After 2 weeks AND [2] still painful or swollen  Answer Assessment - Initial Assessment Questions 1. MECHANISM: How did the injury happen? (e.g., twisting injury, direct blow)      Bumped shin on corner of fireplace when vacuuming 2. ONSET: When did the injury happen? (e.g., minutes, hours ago)      A few months ago 3. LOCATION: Where is the injury located?      L leg 4. APPEARANCE of INJURY: What does the injury look like?  (e.g., deformity of leg)     Scab is still there, but now looking red/painful 5. SEVERITY: Can you put weight on that leg? Can you walk?      yes 6. SIZE: For cuts, bruises, or swelling, ask: How large is it? (e.g., inches or centimeters)      Swelling - size of half dollar 7. PAIN: Is there pain? If Yes, ask: How bad is the pain?   What does it keep you from doing? (Scale 0-10; or none, mild, moderate, severe)     Yes, intermittent sharp pain  8. TETANUS: For any breaks in the skin, ask: When was your last tetanus booster?     01/02/2017 9. OTHER SYMPTOMS: Do you have any other symptoms?      denies 10. PREGNANCY: Is there any chance you are pregnant? When was your last menstrual period?       N/a  Protocols used: Leg Injury-A-AH

## 2023-11-27 NOTE — Progress Notes (Signed)
 Acute Office Visit  Subjective:    Patient ID: Briana Ochoa, female    DOB: August 09, 1940, 83 y.o.   MRN: [id number]  Chief Complaint  Patient presents with   Leg Swelling    Left leg swelling, pain and redness warm sometimes, pain worse while walking or when standing for periods of time   Leg Pain  The incident occurred more than 1 week ago (over 60month). The incident occurred at home (hit leg against firepalce). The injury mechanism was a direct blow. The pain is present in the left leg. The quality of the pain is described as aching. The pain is mild. The pain has been Intermittent since onset. Pertinent negatives include no inability to bear weight, loss of motion, loss of sensation, muscle weakness, numbness or tingling. She reports no foreign bodies present. The symptoms are aggravated by palpation and weight bearing. She has tried nothing for the symptoms.  UTD with TDAP.  Outpatient Medications Prior to Visit  Medication Sig   acetaminophen  (TYLENOL ) 500 MG tablet Take 1,000 mg by mouth daily.   aspirin  EC 81 MG tablet Take 81 mg by mouth daily. Swallow whole.   Cyanocobalamin  (VITAMIN B 12 PO) Take 1 tablet by mouth 3 (three) times a week. Take 1 tablet by mouth 3 times a week. Take on Monday, Wednesday and Friday   folic acid  (FOLVITE ) 1 MG tablet Take 1 tablet (1 mg total) by mouth daily. Start 7 days before pemetrexed chemotherapy. Continue until 21 days after pemetrexed completed.   glucose blood (TRUE METRIX BLOOD GLUCOSE TEST) test strip Use as instructed to test blood sugar once daily E08.638   lisinopril  (ZESTRIL ) 20 MG tablet Take 1 tablet (20 mg total) by mouth daily.   ondansetron  (ZOFRAN ) 8 MG tablet Take 1 tablet (8 mg total) by mouth every 8 (eight) hours as needed for nausea or vomiting. Start on the third day after carboplatin.   pravastatin  (PRAVACHOL ) 20 MG tablet Take 1 tablet (20 mg total) by mouth every evening.   prochlorperazine (COMPAZINE) 10 MG tablet  Take 1 tablet (10 mg total) by mouth every 6 (six) hours as needed for nausea or vomiting.   No facility-administered medications prior to visit.    Reviewed past medical and social history.  Review of Systems  Neurological:  Negative for tingling and numbness.   Per HPI     Objective:    Physical Exam Vitals and nursing note reviewed.  Musculoskeletal:        General: Tenderness present.     Left lower leg: 1+ Edema present.       Legs:     Comments: Scab on anterior left shin with surrounding erythema, no drainage, no fluctuance. Normal and equal pedal pulses.  Skin:    Findings: Erythema present.  Neurological:     Mental Status: She is oriented to person, place, and time.     BP 126/74 (BP Location: Left Arm, Patient Position: Sitting, Cuff Size: Large)   Pulse 72   Temp (!) 97.5 F (36.4 C) (Oral)   Ht 5' 4 (1.626 m)   Wt 194 lb 12.8 oz (88.4 kg)   SpO2 96%   BMI 33.44 kg/m     No results found for any visits on 11/27/23.     Assessment & Plan:   Problem List Items Addressed This Visit   None Visit Diagnoses       Edema of left lower leg    -  Primary  Relevant Medications   doxycycline (VIBRA-TABS) 100 MG tablet   Other Relevant Orders   VAS US  LOWER EXTREMITY VENOUS (DVT)     Cellulitis of left lower extremity       Relevant Medications   doxycycline (VIBRA-TABS) 100 MG tablet   Other Relevant Orders   VAS US  LOWER EXTREMITY VENOUS (DVT)      Meds ordered this encounter  Medications   doxycycline (VIBRA-TABS) 100 MG tablet    Sig: Take 1 tablet (100 mg total) by mouth 2 (two) times daily for 7 days.    Dispense:  14 tablet    Refill:  0    Supervising Provider:   BERNETA ELSIE SAYRE [[id number]]   Return if symptoms worsen or fail to improve.    Briana Mood, NP

## 2023-11-28 ENCOUNTER — Ambulatory Visit (HOSPITAL_COMMUNITY)
Admission: RE | Admit: 2023-11-28 | Discharge: 2023-11-28 | Disposition: A | Discharge status: Home / Self Care | Source: Ambulatory Visit | Attending: Nurse Practitioner | Admitting: Nurse Practitioner

## 2023-11-28 DIAGNOSIS — L03116 Cellulitis of left lower limb: Secondary | ICD-10-CM | POA: Insufficient documentation

## 2023-11-28 DIAGNOSIS — R600 Localized edema: Secondary | ICD-10-CM | POA: Diagnosis not present

## 2023-11-28 NOTE — Progress Notes (Signed)
 Pharmacist Chemotherapy Monitoring - Initial Assessment    Anticipated start date: 12/04/23   The following has been reviewed per standard work regarding the patient's treatment regimen: The patient's diagnosis, treatment plan and drug doses, and organ/hematologic function Lab orders and baseline tests specific to treatment regimen  The treatment plan start date, drug sequencing, and pre-medications Prior authorization status  Patient's documented medication list, including drug-drug interaction screen and prescriptions for anti-emetics and supportive care specific to the treatment regimen The drug concentrations, fluid compatibility, administration routes, and timing of the medications to be used The patient's access for treatment and lifetime cumulative dose history, if applicable  The patient's medication allergies and previous infusion related reactions, if applicable   Changes made to treatment plan:  N/A  Follow up needed:  Pre-treatment B12 injection on 12/01/23 Baseline TSH/T4 (ordered outside of treatment plan) Need to dose reduce pemetrexed for renal function (Scr 1.67, eCrCl 35.6 mL/min) - will sequence Libtayo after pemetrexed dose is confirmed with MD to limit treatment plan manipulations  Briana Ochoa, RPH, 11/28/2023  11:37 AM

## 2023-11-29 ENCOUNTER — Ambulatory Visit: Payer: Self-pay | Admitting: Nurse Practitioner

## 2023-11-29 ENCOUNTER — Other Ambulatory Visit: Payer: Self-pay

## 2023-11-29 ENCOUNTER — Telehealth: Payer: Self-pay

## 2023-11-29 NOTE — Telephone Encounter (Signed)
 Spoke with pt via telephone regarding pt's antiemetics and Folic Acid .  Educated pt on how & when to take the antiemetics (Ondansetron  & Prochloraperazine) and Folic Acid .  Instructed pt to start taking the Folic Acid  now since the pt is scheduled to start treatment on 12/04/2023.   Pt verbalized understanding and had no further questions.  Pt stated she is scheduled on 12/01/2023 for B12 injection.

## 2023-12-01 ENCOUNTER — Inpatient Hospital Stay

## 2023-12-01 DIAGNOSIS — Z8051 Family history of malignant neoplasm of kidney: Secondary | ICD-10-CM | POA: Diagnosis not present

## 2023-12-01 DIAGNOSIS — Z8551 Personal history of malignant neoplasm of bladder: Secondary | ICD-10-CM | POA: Diagnosis not present

## 2023-12-01 DIAGNOSIS — C3412 Malignant neoplasm of upper lobe, left bronchus or lung: Secondary | ICD-10-CM | POA: Diagnosis not present

## 2023-12-01 DIAGNOSIS — E538 Deficiency of other specified B group vitamins: Secondary | ICD-10-CM

## 2023-12-01 DIAGNOSIS — Z86000 Personal history of in-situ neoplasm of breast: Secondary | ICD-10-CM | POA: Diagnosis not present

## 2023-12-01 DIAGNOSIS — Z85038 Personal history of other malignant neoplasm of large intestine: Secondary | ICD-10-CM | POA: Diagnosis not present

## 2023-12-01 DIAGNOSIS — Z803 Family history of malignant neoplasm of breast: Secondary | ICD-10-CM | POA: Diagnosis not present

## 2023-12-01 DIAGNOSIS — C7951 Secondary malignant neoplasm of bone: Secondary | ICD-10-CM | POA: Diagnosis not present

## 2023-12-01 DIAGNOSIS — Z79620 Long term (current) use of immunosuppressive biologic: Secondary | ICD-10-CM | POA: Diagnosis not present

## 2023-12-01 DIAGNOSIS — Z79899 Other long term (current) drug therapy: Secondary | ICD-10-CM | POA: Diagnosis not present

## 2023-12-01 DIAGNOSIS — Z8049 Family history of malignant neoplasm of other genital organs: Secondary | ICD-10-CM | POA: Diagnosis not present

## 2023-12-01 DIAGNOSIS — Z5112 Encounter for antineoplastic immunotherapy: Secondary | ICD-10-CM | POA: Diagnosis not present

## 2023-12-01 DIAGNOSIS — Z923 Personal history of irradiation: Secondary | ICD-10-CM | POA: Diagnosis not present

## 2023-12-01 DIAGNOSIS — Z5111 Encounter for antineoplastic chemotherapy: Secondary | ICD-10-CM | POA: Diagnosis present

## 2023-12-01 DIAGNOSIS — Z800 Family history of malignant neoplasm of digestive organs: Secondary | ICD-10-CM | POA: Diagnosis not present

## 2023-12-01 MED ORDER — CYANOCOBALAMIN 1000 MCG/ML IJ SOLN
1000.0000 ug | Freq: Once | INTRAMUSCULAR | Status: AC
  Administered 2023-12-01: 1000 ug via INTRAMUSCULAR
  Filled 2023-12-01: qty 1

## 2023-12-02 ENCOUNTER — Inpatient Hospital Stay

## 2023-12-04 ENCOUNTER — Inpatient Hospital Stay

## 2023-12-04 ENCOUNTER — Inpatient Hospital Stay: Admitting: Hematology

## 2023-12-04 ENCOUNTER — Encounter: Payer: Self-pay | Admitting: Hematology

## 2023-12-04 ENCOUNTER — Other Ambulatory Visit: Payer: Self-pay

## 2023-12-04 VITALS — BP 145/77 | HR 73 | Resp 18

## 2023-12-04 VITALS — BP 132/70 | HR 84 | Temp 97.6°F | Resp 18 | Ht 64.0 in | Wt 192.3 lb

## 2023-12-04 DIAGNOSIS — C3490 Malignant neoplasm of unspecified part of unspecified bronchus or lung: Secondary | ICD-10-CM

## 2023-12-04 DIAGNOSIS — Z5111 Encounter for antineoplastic chemotherapy: Secondary | ICD-10-CM | POA: Diagnosis not present

## 2023-12-04 DIAGNOSIS — Z79899 Other long term (current) drug therapy: Secondary | ICD-10-CM | POA: Diagnosis not present

## 2023-12-04 DIAGNOSIS — C3432 Malignant neoplasm of lower lobe, left bronchus or lung: Secondary | ICD-10-CM

## 2023-12-04 DIAGNOSIS — C3412 Malignant neoplasm of upper lobe, left bronchus or lung: Secondary | ICD-10-CM | POA: Diagnosis not present

## 2023-12-04 DIAGNOSIS — E538 Deficiency of other specified B group vitamins: Secondary | ICD-10-CM | POA: Diagnosis not present

## 2023-12-04 DIAGNOSIS — Z79620 Long term (current) use of immunosuppressive biologic: Secondary | ICD-10-CM | POA: Diagnosis not present

## 2023-12-04 DIAGNOSIS — C186 Malignant neoplasm of descending colon: Secondary | ICD-10-CM

## 2023-12-04 DIAGNOSIS — Z5112 Encounter for antineoplastic immunotherapy: Secondary | ICD-10-CM | POA: Diagnosis not present

## 2023-12-04 DIAGNOSIS — Z86000 Personal history of in-situ neoplasm of breast: Secondary | ICD-10-CM | POA: Diagnosis not present

## 2023-12-04 DIAGNOSIS — C7951 Secondary malignant neoplasm of bone: Secondary | ICD-10-CM

## 2023-12-04 LAB — CMP (CANCER CENTER ONLY)
ALT: 7 U/L (ref 0–44)
AST: 13 U/L — ABNORMAL LOW (ref 15–41)
Albumin: 4.1 g/dL (ref 3.5–5.0)
Alkaline Phosphatase: 98 U/L (ref 38–126)
Anion gap: 5 (ref 5–15)
BUN: 31 mg/dL — ABNORMAL HIGH (ref 8–23)
CO2: 28 mmol/L (ref 22–32)
Calcium: 10.1 mg/dL (ref 8.9–10.3)
Chloride: 106 mmol/L (ref 98–111)
Creatinine: 1.6 mg/dL — ABNORMAL HIGH (ref 0.44–1.00)
GFR, Estimated: 32 mL/min — ABNORMAL LOW (ref >60)
Glucose, Bld: 105 mg/dL — ABNORMAL HIGH (ref 70–99)
Potassium: 5.6 mmol/L — ABNORMAL HIGH (ref 3.5–5.1)
Sodium: 139 mmol/L (ref 135–145)
Total Bilirubin: 0.3 mg/dL (ref 0.0–1.2)
Total Protein: 7.3 g/dL (ref 6.5–8.1)

## 2023-12-04 LAB — CBC WITH DIFFERENTIAL (CANCER CENTER ONLY)
Abs Immature Granulocytes: 0.02 K/uL (ref 0.00–0.07)
Basophils Absolute: 0.1 K/uL (ref 0.0–0.1)
Basophils Relative: 1 %
Eosinophils Absolute: 0.3 K/uL (ref 0.0–0.5)
Eosinophils Relative: 3 %
HCT: 47.5 % — ABNORMAL HIGH (ref 36.0–46.0)
Hemoglobin: 15.6 g/dL — ABNORMAL HIGH (ref 12.0–15.0)
Immature Granulocytes: 0 %
Lymphocytes Relative: 23 %
Lymphs Abs: 2.3 K/uL (ref 0.7–4.0)
MCH: 31.1 pg (ref 26.0–34.0)
MCHC: 32.8 g/dL (ref 30.0–36.0)
MCV: 94.8 fL (ref 80.0–100.0)
Monocytes Absolute: 1.1 K/uL — ABNORMAL HIGH (ref 0.1–1.0)
Monocytes Relative: 11 %
Neutro Abs: 6.1 K/uL (ref 1.7–7.7)
Neutrophils Relative %: 62 %
Platelet Count: 292 K/uL (ref 150–400)
RBC: 5.01 MIL/uL (ref 3.87–5.11)
RDW: 14 % (ref 11.5–15.5)
WBC Count: 9.9 K/uL (ref 4.0–10.5)
nRBC: 0 % (ref 0.0–0.2)

## 2023-12-04 LAB — VITAMIN B12: Vitamin B-12: 3470 pg/mL — ABNORMAL HIGH (ref 180–914)

## 2023-12-04 MED ORDER — SODIUM CHLORIDE 0.9 % IV SOLN
400.0000 mg/m2 | Freq: Once | INTRAVENOUS | Status: AC
  Administered 2023-12-04: 800 mg via INTRAVENOUS
  Filled 2023-12-04: qty 20

## 2023-12-04 MED ORDER — SODIUM CHLORIDE 0.9 % IV SOLN: INTRAVENOUS | Status: DC

## 2023-12-04 MED ORDER — PROCHLORPERAZINE MALEATE 10 MG PO TABS
10.0000 mg | ORAL_TABLET | Freq: Once | ORAL | Status: AC
  Administered 2023-12-04: 10 mg via ORAL
  Filled 2023-12-04: qty 1

## 2023-12-04 MED ORDER — SODIUM CHLORIDE 0.9 % IV SOLN
350.0000 mg | Freq: Once | INTRAVENOUS | Status: AC
  Administered 2023-12-04: 350 mg via INTRAVENOUS
  Filled 2023-12-04: qty 7

## 2023-12-04 NOTE — Patient Instructions (Signed)
 [facility] - A DEPT OF [facility]  Discharge Instructions: Thank you for choosing [facility] to provide your oncology and hematology care.   If you have a lab appointment with the Cancer Center, please go directly to the Cancer Center and check in at the registration area.   Wear comfortable clothing and clothing appropriate for easy access to any Portacath or PICC line.   We strive to give you quality time with your provider. You may need to reschedule your appointment if you arrive late (15 or more minutes).  Arriving late affects you and other patients whose appointments are after yours.  Also, if you miss three or more appointments without notifying the office, you may be dismissed from the clinic at the provider's discretion.      For prescription refill requests, have your pharmacy contact our office and allow 72 hours for refills to be completed.    Today you received the following chemotherapy and/or immunotherapy agents PEMETREXED (Alimta) and CEMIPLIMAB (Libtayo)   To help prevent nausea and vomiting after your treatment, we encourage you to take your nausea medication as directed.  BELOW ARE SYMPTOMS THAT SHOULD BE REPORTED IMMEDIATELY: *FEVER GREATER THAN 100.4 F (38 C) OR HIGHER *CHILLS OR SWEATING *NAUSEA AND VOMITING THAT IS NOT CONTROLLED WITH YOUR NAUSEA MEDICATION *UNUSUAL SHORTNESS OF BREATH *UNUSUAL BRUISING OR BLEEDING *URINARY PROBLEMS (pain or burning when urinating, or frequent urination) *BOWEL PROBLEMS (unusual diarrhea, constipation, pain near the anus) TENDERNESS IN MOUTH AND THROAT WITH OR WITHOUT PRESENCE OF ULCERS (sore throat, sores in mouth, or a toothache) UNUSUAL RASH, SWELLING OR PAIN  UNUSUAL VAGINAL DISCHARGE OR ITCHING   Items with * indicate a potential emergency and should be followed up as soon as possible or go to the Emergency Department if any problems should occur.  Please show the  CHEMOTHERAPY ALERT CARD or IMMUNOTHERAPY ALERT CARD at check-in to the Emergency Department and triage nurse.  Should you have questions after your visit or need to cancel or reschedule your appointment, please contact [facility] - A DEPT OF [facility]  Dept: [phone]  and follow the prompts.  Office hours are 8:00 a.m. to 4:30 p.m. Monday - Friday. Please note that voicemails left after 4:00 p.m. may not be returned until the following business day.  We are closed weekends and major holidays. You have access to a nurse at all times for urgent questions. Please call the main number to the clinic Dept: [phone] and follow the prompts.   For any non-urgent questions, you may also contact your provider using MyChart. We now offer e-Visits for anyone 77 and older to request care online for non-urgent symptoms. For details visit [email or web address].   Also download the MyChart app! Go to the app store, search MyChart, open the app, select [facility], and log in with your MyChart username and password.   Pemetrexed Injection What is this medication? PEMETREXED (PEM e TREX ed) treats some types of cancer. It works by slowing down the growth of cancer cells. This medicine may be used for other purposes; ask your health care provider or pharmacist if you have questions. COMMON BRAND NAME(S): Alimta, PEMFEXY, PEMRYDI RTU What should I tell my care team before I take this medication? They need to know if you have any of these conditions: Infection, such as chickenpox, cold sores, or herpes Kidney disease Low blood cell levels (white  cells, red cells, and platelets) Lung or breathing disease, such as asthma Radiation therapy An unusual or allergic reaction to pemetrexed, other medications, foods, dyes, or preservatives If you or your partner are pregnant or trying to get pregnant Breast-feeding How should I use this medication? This medication is  injected into a vein. It is given by your care team in a hospital or clinic setting. Talk to your care team about the use of this medication in children. Special care may be needed. Overdosage: If you think you have taken too much of this medicine contact a poison control center or emergency room at once. NOTE: This medicine is only for you. Do not share this medicine with others. What if I miss a dose? Keep appointments for follow-up doses. It is important not to miss your dose. Call your care team if you are unable to keep an appointment. What may interact with this medication? Do not take this medication with any of the following: Live virus vaccines This medication may also interact with the following: Ibuprofen  This list may not describe all possible interactions. Give your health care provider a list of all the medicines, herbs, non-prescription drugs, or dietary supplements you use. Also tell them if you smoke, drink alcohol, or use illegal drugs. Some items may interact with your medicine. What should I watch for while using this medication? Your condition will be monitored carefully while you are receiving this medication. This medication may make you feel generally unwell. This is not uncommon as chemotherapy can affect healthy cells as well as cancer cells. Report any side effects. Continue your course of treatment even though you feel ill unless your care team tells you to stop. This medication can cause serious side effects. To reduce the risk, your care team may give you other medications to take before receiving this one. Be sure to follow the directions from your care team. This medication can cause a rash or redness in areas of the body that have previously had radiation therapy. If you have had radiation therapy, tell your care team if you notice a rash in this area. This medication may increase your risk of getting an infection. Call your care team for advice if you get a fever,  chills, sore throat, or other symptoms of a cold or flu. Do not treat yourself. Try to avoid being around people who are sick. Be careful brushing or flossing your teeth or using a toothpick because you may get an infection or bleed more easily. If you have any dental work done, tell your dentist you are receiving this medication. Avoid taking medications that contain aspirin , acetaminophen , ibuprofen , naproxen, or ketoprofen unless instructed by your care team. These medications may hide a fever. Check with your care team if you have severe diarrhea, nausea, and vomiting, or if you sweat a lot. The loss of too much body fluid may make it dangerous for you to take this medication. Talk to your care team if you or your partner wish to become pregnant or think either of you might be pregnant. This medication can cause serious birth defects if taken during pregnancy and for 6 months after the last dose. A negative pregnancy test is required before starting this medication. A reliable form of contraception is recommended while taking this medication and for 6 months after the last dose. Talk to your care team about reliable forms of contraception. Do not father a child while taking this medication and for 3 months after the  last dose. Use a condom while having sex during this time period. Do not breastfeed while taking this medication and for 1 week after the last dose. This medication may cause infertility. Talk to your care team if you are concerned about your fertility. What side effects may I notice from receiving this medication? Side effects that you should report to your care team as soon as possible: Allergic reactions--skin rash, itching, hives, swelling of the face, lips, tongue, or throat Dry cough, shortness of breath or trouble breathing Infection--fever, chills, cough, sore throat, wounds that don't heal, pain or trouble when passing urine, general feeling of discomfort or being unwell Kidney  injury--decrease in the amount of urine, swelling of the ankles, hands, or feet Low red blood cell level--unusual weakness or fatigue, dizziness, headache, trouble breathing Redness, blistering, peeling, or loosening of the skin, including inside the mouth Unusual bruising or bleeding Side effects that usually do not require medical attention (report to your care team if they continue or are bothersome): Fatigue Loss of appetite Nausea Vomiting This list may not describe all possible side effects. Call your doctor for medical advice about side effects. You may report side effects to FDA at 1-800-FDA-1088. Where should I keep my medication? This medication is given in a hospital or clinic. It will not be stored at home. NOTE: This sheet is a summary. It may not cover all possible information. If you have questions about this medicine, talk to your doctor, pharmacist, or health care provider.  2024 Elsevier/Gold Standard (2021-06-01 00:00:00)  Cemiplimab Injection What is this medication? CEMIPLIMAB (se MIP li mab) treats skin cancer and lung cancer. It works by helping your immune system slow or stop the spread of cancer cells. It is a monoclonal antibody. This medicine may be used for other purposes; ask your health care provider or pharmacist if you have questions. COMMON BRAND NAME(S): LIBTAYO What should I tell my care team before I take this medication? They need to know if you have any of these conditions: Allogeneic stem cell transplant (uses someone else's stem cells) Autoimmune diseases, such as Crohn disease, ulcerative colitis, lupus History of chest radiation Nervous system problems, such as Guillain-Barre syndrome or myasthenia gravis Organ transplant An unusual or allergic reaction to cemiplimab, other medications, foods, dyes, or preservatives Pregnant or trying to get pregnant Breastfeeding How should I use this medication? This medication is infused into a vein. It is  given by your care team in a hospital or clinic setting. A special MedGuide will be given to you before each treatment. Be sure to read this information carefully each time. Talk to your care team about the use of this medication in children. Special care may be needed. Overdosage: If you think you have taken too much of this medicine contact a poison control center or emergency room at once. NOTE: This medicine is only for you. Do not share this medicine with others. What if I miss a dose? Keep appointments for follow-up doses. It is important not to miss your dose. Call your care team if you are unable to keep an appointment. What may interact with this medication? Interactions have not been studied. This list may not describe all possible interactions. Give your health care provider a list of all the medicines, herbs, non-prescription drugs, or dietary supplements you use. Also tell them if you smoke, drink alcohol, or use illegal drugs. Some items may interact with your medicine. What should I watch for while using this medication?  Visit your care team for regular checks on your progress. It may be some time before you see the benefit from this medication. You may need blood work done while taking this medication. This medication may cause serious skin reactions. They can happen weeks to months after starting the medication. Contact your care team right away if you notice fevers or flu-like symptoms with a rash. The rash may be red or purple and then turn into blisters or peeling of the skin. You may also notice a red rash with swelling of the face, lips, or lymph nodes in your neck or under your arms. Tell your care team right away if you have any change in your eyesight. Talk to your care team if you may be pregnant. You will need a negative pregnancy test before starting this medication. Contraception is recommended while taking this medication and for 4 months after the last dose. Your care  team can help you find the option that works for you. Do not breastfeed while taking this medication and for at least 4 months after the last dose. What side effects may I notice from receiving this medication? Side effects that you should report to your care team as soon as possible: Allergic reactions--skin rash, itching, hives, swelling of the face, lips, tongue, or throat Dry cough, shortness of breath or trouble breathing Eye pain, redness, irritation, or discharge with blurry or decreased vision Heart muscle inflammation--unusual weakness or fatigue, shortness of breath, chest pain, fast or irregular heartbeat, dizziness, swelling of the ankles, feet, or hands Hormone gland problems--headache, sensitivity to light, unusual weakness or fatigue, dizziness, fast or irregular heartbeat, increased sensitivity to cold or heat, excessive sweating, constipation, hair loss, increased thirst or amount of urine, tremors or shaking, irritability Infusion reactions--chest pain, shortness of breath or trouble breathing, feeling faint or lightheaded Kidney injury (glomerulonephritis)--decrease in the amount of urine, red or dark brown urine, foamy or bubbly urine, swelling of the ankles, hands, or feet Liver injury--right upper belly pain, loss of appetite, nausea, light-colored stool, dark yellow or brown urine, yellowing skin or eyes, unusual weakness or fatigue Pain, tingling, or numbness in the hands or feet, muscle weakness, change in vision, confusion or trouble speaking, loss of balance or coordination, trouble walking, seizures Rash, fever, and swollen lymph nodes Redness, blistering, peeling, or loosening of the skin, including inside the mouth Sudden or severe stomach pain, bloody diarrhea, fever, nausea, vomiting Side effects that usually do not require medical attention (report these to your care team if they continue or are bothersome): Bone, joint, or muscle pain Diarrhea Fatigue Loss of  appetite Nausea Skin rash This list may not describe all possible side effects. Call your doctor for medical advice about side effects. You may report side effects to FDA at 1-800-FDA-1088. Where should I keep my medication? This medication is given in a hospital or clinic and will not be stored at home. NOTE: This sheet is a summary. It may not cover all possible information. If you have questions about this medicine, talk to your doctor, pharmacist, or health care provider.  2024 Elsevier/Gold Standard (2022-03-14 00:00:00)

## 2023-12-04 NOTE — Progress Notes (Signed)
 [facility]   [phone] [phone]   Clinic Follow up Note   Patient Care Team: Nche, Roselie Rockford, NP as PCP - General (Internal Medicine) Odean Potts, MD as Consulting Physician (Hematology and Oncology) Rosemarie Eather RAMAN, MD as Consulting Physician (Neurology) Lanis Pupa, MD as Consulting Physician (Neurosurgery) Charmayne Molly, MD as Consulting Physician (Ophthalmology) Lanny Callander, MD as Consulting Physician (Hematology and Oncology) Debby Hila, MD as Consulting Physician (General Surgery)  Date of Service:  12/04/2023  CHIEF COMPLAINT: f/u of non-small cell lung cancer  CURRENT THERAPY:  First first-line chemotherapy pemetrexed and Keytruda  every 3 weeks  Oncology History   NSCLC metastatic to bone [facility]) -Diagnosed in 09/2023, stage IV with bone mets, KRAS G12V mutation (+) -.  During her CT surveillance for her colon cancer in 2022, she was found to have multiple lung nodules.  The left upper lung nodule size slightly increased in 2023, she underwent bronchoscopy biopsy which was negative.  Due to the further increase in the size in 2025, she had a repeated bronchoscopy biopsy in August 2025, which confirmed adenocarcinoma.  The immunostain studies do not support colorectal primary, and a favor lung or upper GI primary.  EGD and colonoscopy were negative for malignancy. -PET scan in September 2025 unfortunately showed a oligo bone metastasis in right scapula.  She was referred to see thoracic surgeon Dr. Kerrin, surgery was not offered due to the metastatic disease. -Guardant360 was negative for targetable mutation -I recommend chemotherapy with single agent pemetrexed, and Keytruda  if PD-L1 expression more than 1%.  Assessment & Plan Metastatic non-small cell lung cancer with bone metastases Currently undergoing maintenance therapy with chemotherapy and immunotherapy. The disease is not curable due to bone metastases.  Treatment aims to control the disease and shrink the tumor. She is receiving a single chemotherapy drug and immunotherapy due to her age and overall health status. The PD-L1 test result indicates that immunotherapy alone is insufficient, supporting the combination therapy approach. Common side effects include fatigue, nausea, and hair thinning, with nausea peaking on days 3-5 post-treatment. She is at moderate risk for infection due to lowered blood counts but can continue normal activities with precautions. - Administer chemotherapy and immunotherapy every three weeks. - Monitor for side effects such as fatigue, nausea, and hair thinning. - Advise on infection prevention measures, including wearing a mask in crowded places and receiving flu and COVID vaccinations. - Instruct to call if experiencing dehydration symptoms, fever, chills, severe diarrhea, or significant side effects. - Schedule next treatment sessions for November 17, December 8, and December 29. - Consider port placement if venous access becomes difficult.  Plan - I reviewed the PD-L1 results from [facility], which showed CPS 10%, she would benefit from immunotherapy. - Lab reviewed, will proceed for cycle pemetrexed and Keytruda  today, and continue every 3 weeks. -f/u in 3 weeks, patient knows to call me if she experience significant side effect after first cycle chemo   SUMMARY OF ONCOLOGIC HISTORY: Oncology History Overview Note  Cancer Staging Cancer of left colon [facility]) Staging form: Colon and Rectum, AJCC 8th Edition - Pathologic stage from 05/28/2020: Stage IIA (pT3, pN0, cM0) - Signed by Lanny Callander, MD on 06/19/2020 Stage prefix: Initial diagnosis Total positive nodes: 0 Histologic grading system: 4 grade system Histologic grade (G): G3 Residual tumor (R): R0 - None  Ductal carcinoma in situ (DCIS) of left breast Staging form: Breast, AJCC 7th Edition - Clinical: Stage 0 (Tis (DCIS), N0, cM0) - Unsigned  Specimen type: Core  Needle Biopsy Histopathologic type: 9932 Laterality: Left Staging comments: Staged at breast conference 7.22.15  - Pathologic: Stage 0 (Tis (DCIS)(2), N0, cM0) - Unsigned Specimen type: Core Needle Biopsy Histopathologic type: 9932 Laterality: Left Tumor size (mm): 3 Multiple tumors: Yes Number of tumors: 2 Method of lymph node assessment: Clinical Method of detection of distant metastases: Clinical Residual tumor (R): R0 - None Estrogen receptor status: Positive Progesterone receptor status: Positive    Ductal carcinoma in situ (DCIS) of left breast  08/20/2013 Initial Diagnosis   Left breast 12:00: DCIS with calcifications, yet 100%, PR 100%; 09/04/2013 second group of calcifications also biopsied proven to be DCIS ER/PR positive   09/19/2013 Surgery   Left breast lumpectomy DCIS 2 foci margins negative, 0.2 and 0.3 cm ER 100% PR 100%   10/08/2013 - 11/12/2013 Radiation Therapy   Adjuvant radiation therapy   11/12/2013 - 2020 Anti-estrogen oral therapy   Anastrozole  1 mg daily plan is for 5 years   01/09/2014 - 01/10/2014 Hospital Admission   Pipeline embolization 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 The report date is 07/30/2020.   The CancerNext-Expanded + RNAinsight gene panel offered by [employer] and includes sequencing and rearrangement analysis for the following 77 genes: AIP, ALK, APC, ATM, AXIN2, BAP1, BARD1, BLM, BMPR1A, BRCA1, BRCA2, BRIP1, CDC73, CDH1, CDK4, CDKN1B, CDKN2A, CHEK2, CTNNA1, DICER1, FANCC, FH, FLCN, GALNT12, KIF1B, LZTR1, MAX, MEN1, MET, MLH1, MSH2, MSH3, MSH6, MUTYH, NBN, NF1, NF2, NTHL1, PALB2, PHOX2B, PMS2, POT1, PRKAR1A, PTCH1, PTEN, RAD51C, RAD51D, RB1, RECQL, RET, SDHA, SDHAF2, SDHB, SDHC, SDHD, SMAD4, SMARCA4, SMARCB1, SMARCE1, STK11, SUFU, TMEM127, TP53, TSC1, TSC2, VHL and XRCC2 (sequencing and deletion/duplication);  EGFR, EGLN1, HOXB13, KIT, MITF, PDGFRA, POLD1 and POLE (sequencing only); EPCAM and GREM1 (deletion/duplication only). RNA data is routinely analyzed for use in variant interpretation for all genes.   Bladder cancer (HCC)  02/09/2018 Initial Diagnosis   Bladder cancer (HCC)   07/30/2020 Genetic Testing   Negative genetic testing:  No pathogenic variants detected on the [employer] CancerNext-Expanded + RNAinsight panel. The report date is 07/30/2020.   The CancerNext-Expanded + RNAinsight gene panel offered by [employer] and includes sequencing and rearrangement analysis for the following 77 genes: AIP, ALK, APC, ATM, AXIN2, BAP1, BARD1, BLM, BMPR1A, BRCA1, BRCA2, BRIP1, CDC73, CDH1, CDK4, CDKN1B, CDKN2A, CHEK2, CTNNA1, DICER1, FANCC, FH, FLCN, GALNT12, KIF1B, LZTR1, MAX, MEN1, MET, MLH1, MSH2, MSH3, MSH6, MUTYH, NBN, NF1, NF2, NTHL1, PALB2, PHOX2B, PMS2, POT1, PRKAR1A, PTCH1, PTEN, RAD51C, RAD51D, RB1, RECQL, RET, SDHA, SDHAF2, SDHB, SDHC, SDHD, SMAD4, SMARCA4, SMARCB1, SMARCE1, STK11, SUFU, TMEM127, TP53, TSC1, TSC2, VHL and XRCC2 (sequencing and deletion/duplication); EGFR, EGLN1, HOXB13, KIT, MITF, PDGFRA, POLD1 and POLE (sequencing only); EPCAM and GREM1 (deletion/duplication only). RNA data is routinely analyzed for use in variant interpretation for all genes.   08/31/2020 Imaging   CT CAP w/o contrast  IMPRESSION: 1. No specific findings of active malignancy on today's noncontrast CT. There has been interval partial colectomy involving the tumor site with reanastomosis. Some faint stranding in the adjacent omentum is probably from scarring or fat necrosis, less likely due to early tumor given the lack of overt nodularity. Surveillance imaging is likely indicated, and PET-CT may have a role in imaging follow up. 2. Several small pulmonary nodules, in addition to a larger mixed density nodule in the left upper lobe. Surveillance is recommended. 3. Other  imaging findings of potential clinical  significance: Aortic Atherosclerosis (ICD10-I70.0). Coronary atherosclerosis. Mitral valve calcification. Small type 1 hiatal hernia. Splenectomy with small amount of regenerative splenic tissue as well as a venous varix arising from the splenic vein. Hyperdense left kidney lower pole exophytic homogeneous lesion, probably a complex cyst. Sigmoid colon diverticulosis. Multilevel lumbar spondylosis and degenerative disc disease.   Cancer of left colon (HCC)  04/23/2020 Procedure   Upper Endoscopy and Colonoscopy by Dr Avram IMPRESSION Erythematous mucosa in the antrum. Biopsied. - Small hiatal hernia. - Gastroesophageal flap valve classified as Hill Grade IV (no fold, wide open lumen, hiatal hernia present). - The examination was otherwise normal.   IMPRESSION - Decreased sphincter tone found on digital rectal exam. - Malignant partially obstructing tumor in the proximal descending colon. Biopsied. Tattooed. - Three 5 to 20 mm polyps in the sigmoid colon, removed piecemeal using a cold snare. Resected and retrieved. - Severe diverticulosis in the sigmoid colon. - I left several diminutive descending (proximal to tattoo), sigmoid and rectal polyps - The examination was otherwise normal on direct and retroflexion views.     04/23/2020 Initial Diagnosis   Diagnosis 1. Descending Colon Polyp, mass - ADENOCARCINOMA. 2. Sigmoid Colon Polyp, (3) - MULTIPLE FRAGMENTS OF TUBULAR ADENOMA. - MULTIPLE FRAGMENTS OF SESSILE SERRATED POLYP WITHOUT DYSPLASIA. - NO HIGH GRADE DYSPLASIA OR MALIGNANCY. Microscopic Comment 1. Dr. Rebbecca has reviewed the case. Dr. Avram was notified on 04/24/2020. 2. Despite being left sided the fragments have features of a sessile serrated polyp, as opposed to, hyperplastic polyp.   04/27/2020 Imaging   CT AP IMPRESSION: Bulky annular soft tissue mass involving the distal transverse colon with extension into adjacent pericolonic fat, consistent  with primary colon carcinoma.   No evidence of metastatic disease.   Colonic diverticulosis. No radiographic evidence of diverticulitis.   Aortic Atherosclerosis (ICD10-I70.0).     05/05/2020 Imaging   CT Chest IMPRESSION: Bulky annular soft tissue mass involving the distal transverse colon with extension into adjacent pericolonic fat, consistent with primary colon carcinoma.   No evidence of metastatic disease.   Colonic diverticulosis. No radiographic evidence of diverticulitis.   Aortic Atherosclerosis (ICD10-I70.0).     05/28/2020 Initial Diagnosis   Cancer of left colon (HCC)   05/28/2020 Cancer Staging   Staging form: Colon and Rectum, AJCC 8th Edition - Pathologic stage from 05/28/2020: Stage IIA (pT3, pN0, cM0) - Signed by Lanny Callander, MD on 06/19/2020 Stage prefix: Initial diagnosis Total positive nodes: 0 Histologic grading system: 4 grade system Histologic grade (G): G3 Residual tumor (R): R0 - None   05/28/2020 Surgery   XI ROBOT ASSISTED RESECTION OF SPLENIC FLEXURE by Dr Debby    FINAL MICROSCOPIC DIAGNOSIS:   A. COLON, SPLENIC FLEXURE, RESECTION:  - Invasive adenocarcinoma, poorly differentiated, spanning 9.7 cm.  - Tumor invades through muscularis propria into pericolonic tissue.  - Resection margins are negative.  - Tubular adenoma (X1).  - Sessile serrate polyp without dysplasia (x2).  - Diverticulosis.  - Polypectomy scar.  - Twenty-four of twenty-four lymph nodes negative for carcinoma (0/24).  - See oncology table.    07/07/2020 Miscellaneous   Guardant Reveal ctDNA positive MSI high   07/30/2020 Genetic Testing   Negative genetic testing:  No pathogenic variants detected on the [employer] CancerNext-Expanded + RNAinsight panel. The report date is 07/30/2020.   The CancerNext-Expanded + RNAinsight gene panel offered by [employer] and includes sequencing and rearrangement analysis for the following 77 genes: AIP, ALK, APC, ATM,  AXIN2, BAP1, BARD1,  BLM, BMPR1A, BRCA1, BRCA2, BRIP1, CDC73, CDH1, CDK4, CDKN1B, CDKN2A, CHEK2, CTNNA1, DICER1, FANCC, FH, FLCN, GALNT12, KIF1B, LZTR1, MAX, MEN1, MET, MLH1, MSH2, MSH3, MSH6, MUTYH, NBN, NF1, NF2, NTHL1, PALB2, PHOX2B, PMS2, POT1, PRKAR1A, PTCH1, PTEN, RAD51C, RAD51D, RB1, RECQL, RET, SDHA, SDHAF2, SDHB, SDHC, SDHD, SMAD4, SMARCA4, SMARCB1, SMARCE1, STK11, SUFU, TMEM127, TP53, TSC1, TSC2, VHL and XRCC2 (sequencing and deletion/duplication); EGFR, EGLN1, HOXB13, KIT, MITF, PDGFRA, POLD1 and POLE (sequencing only); EPCAM and GREM1 (deletion/duplication only). RNA data is routinely analyzed for use in variant interpretation for all genes.   08/31/2020 Imaging   CT CAP w/o contrast  IMPRESSION: 1. No specific findings of active malignancy on today's noncontrast CT. There has been interval partial colectomy involving the tumor site with reanastomosis. Some faint stranding in the adjacent omentum is probably from scarring or fat necrosis, less likely due to early tumor given the lack of overt nodularity. Surveillance imaging is likely indicated, and PET-CT may have a role in imaging follow up. 2. Several small pulmonary nodules, in addition to a larger mixed density nodule in the left upper lobe. Surveillance is recommended. 3. Other imaging findings of potential clinical significance: Aortic Atherosclerosis (ICD10-I70.0). Coronary atherosclerosis. Mitral valve calcification. Small type 1 hiatal hernia. Splenectomy with small amount of regenerative splenic tissue as well as a venous varix arising from the splenic vein. Hyperdense left kidney lower pole exophytic homogeneous lesion, probably a complex cyst. Sigmoid colon diverticulosis. Multilevel lumbar spondylosis and degenerative disc disease.   10/02/2020 - 09/17/2021 Chemotherapy   Patient is on Treatment Plan : COLORECTAL Pembrolizumab  q21d     03/01/2021 Imaging   EXAM: CT CHEST, ABDOMEN AND PELVIS WITHOUT CONTRAST  IMPRESSION: 1. Mixed  attenuation lesion posterior left upper lobe has areas of more confluent and enlarging nodularity today. Interval progression raises concern for neoplasm. Consider PET-CT to further evaluate. 2. Stable 7 mm right lower lobe pulmonary nodule with a slightly more conspicuous 3 mm nodule in the right lower lobe. Continued close attention on follow-up recommended. 3. No evidence for metastatic disease in the abdomen or pelvis. 4. Aortic Atherosclerosis (ICD10-I70.0).   03/17/2021 PET scan   IMPRESSION: 1. Part solid nodule of the left upper lobe demonstrates mild FDG uptake, and has increased in size when compared with prior exam dated May 05, 2020. Concerning for indolent primary lung neoplasm. 2. No evidence of metastatic disease in the chest, abdomen or pelvis. 3. 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 Recommend further evaluation with tissue sampling. 4. Aortic Atherosclerosis (ICD10-I70.0).   03/15/2022 Imaging    IMPRESSION: Stable irregular solid pulmonary nodule in posterior left upper lobe.   No evidence of recurrent or metastatic disease within the abdomen or pelvis.   Colonic diverticulosis, without radiographic evidence of diverticulitis.   NSCLC metastatic to bone [facility])  10/02/2023 Cancer Staging   Staging form: Lung, AJCC V9 - Clinical stage from 10/02/2023: cT1b, cN1, cM1 - Signed by Lanny Callander, MD on 11/20/2023   11/20/2023 Initial Diagnosis   NSCLC metastatic to bone (HCC)   12/04/2023 -  Chemotherapy   Patient is on Treatment Plan : Lung Pemetrexed (500) + Pembrolizumab  (200) D1 q21d        Discussed the use of AI scribe software for clinical note transcription with the patient, who gave verbal consent to proceed.  History of Present Illness Briana Ochoa is an 83 year old female with metastatic lung cancer who presents for follow-up. She is accompanied  by her son, Larnell.  She is recovering from  recent surgery and is making progress. She is able to drive and has no new pain or breathing issues. Her appetite is good, and her energy levels are stable without excessive fatigue.  She is on a regimen of a single chemotherapy drug and immunotherapy every three weeks. She receives a B12 injection and takes folic acid  orally. She previously received Keytruda  for colon cancer, which is now being used for her lung cancer. Alimta is administered intravenously without a port.  She manages nausea at home with medication, which typically occurs on the second or third day post-treatment. Fatigue begins a few days after treatment and lasts three to five days.     All other systems were reviewed with the patient and are negative.  MEDICAL HISTORY:  Past Medical History:  Diagnosis Date   Allergy    seasonal allergies   Anemia    on meds   Aortic atherosclerosis    Bladder cancer (HCC) 2020   Bladder tumor    Blood transfusion without reported diagnosis    Bloody diarrhea 09/29/2017   Breast cancer (HCC) 2015   Left Breast Cancer   Cataract    bilateral -sx   CKD (chronic kidney disease), stage III (HCC)    DM related   Colon cancer (HCC)    Diabetes mellitus type 2, diet-controlled (HCC)    diet controlled   Dyspnea 09/28/2017   Dysuria    Family history of adverse reaction to anesthesia    aunt- N/V    Family history of breast cancer    Family history of kidney cancer    Family history of throat cancer    Family history of uterine cancer    Fatigue 09/28/2017   Frequency of urination    Grade I diastolic dysfunction 08/06/2014   Noted on ECHO   Gross hematuria 09/29/2017   Heart murmur    since rheumatic fever as child   Hematuria    History of cardiomegaly 02/12/2005   Mild, noted on CXR   History of CVA (cerebrovascular accident) 08/05/2014   post op cerebral angiogram, cerebral thrombosis w/ cerebral infarction;  11-02-2017  per pt no residuals   History of malignant  melanoma of skin 12/2015   s/p wide local excision right lower leg (per pt localized)   History of rheumatic fever as a child    Hyperlipidemia    on meds   Hypertension    on meds   Intracranial aneurysm dx 07/ 2015   paraophthalmic RICA aneurysm /   s/p  Pipeline embolization right ICA 01-09-2014   Jaundice 09/28/2017   Malignant neoplasm of central portion of left breast in female, estrogen receptor positive [facility]) oncologist-  dr odean   dx 07/ 2015--- DCIS, ER/PR positive-- s/p  left breast lumpectomy 09-19-2013,  completed radiation 11-12-2013,  started arimidex  11-12-2013   Melanoma (HCC)    righ tleg    OA (osteoarthritis)    knees, hands, R shoulder   Osteopenia 10/16/2012   Diffuse   Personal history of radiation therapy    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   Sigmoid diverticulosis    Stroke (HCC)    Weakness 09/28/2017   Wears dentures    upper   Wears glasses    Wears hearing aid in both ears     SURGICAL HISTORY: Past Surgical History:  Procedure Laterality Date   BREAST LUMPECTOMY Left 2015  BREAST LUMPECTOMY WITH NEEDLE LOCALIZATION Left 09/19/2013   Procedure: LEFT BREAST LUMPECTOMY WITH DOUBLE WIRE BRACKETED  NEEDLE LOCALIZATION;  Surgeon: Elon CHRISTELLA Pacini, MD;  Location: [facility] OR;  Service: General;  Laterality: Left;   BRONCHIAL BIOPSY  04/06/2021   Procedure: BRONCHIAL BIOPSIES;  Surgeon: Brenna Adine CROME, DO;  Location: [facility] ENDOSCOPY;  Service: Pulmonary;;   BRONCHIAL BRUSHINGS  04/06/2021   Procedure: BRONCHIAL BRUSHINGS;  Surgeon: Brenna Adine CROME, DO;  Location: [facility] ENDOSCOPY;  Service: Pulmonary;;   CATARACT EXTRACTION W/ INTRAOCULAR LENS  IMPLANT, BILATERAL  06/2015   CESAREAN SECTION  1964   CHOLECYSTECTOMY OPEN  1970s   CYSTOSCOPY W/ URETERAL STENT PLACEMENT Left 11/07/2017   Procedure: CYSTOSCOPY WITH RETROGRADE PYELOGRAM/URETERAL STENT PLACEMENT;  Surgeon: Sherrilee Belvie CROME, MD;  Location: [facility];  Service: Urology;   Laterality: Left;   CYSTOSCOPY W/ URETERAL STENT PLACEMENT Bilateral 04/27/2018   Procedure: CYSTOSCOPY WITH RETROGRADE PYELOGRAM;  Surgeon: Sherrilee Belvie CROME, MD;  Location: [facility];  Service: Urology;  Laterality: Bilateral;   FINGER SURGERY     lt thumb  from dog bite   IR  NEPHROURETERAL CATH PLACE LEFT  04/28/2018   MELANOMA EXCISION Right 12/28/2015   Procedure: EXCISION MELANOMA RIGHT LOWER LEG;  Surgeon: Elon Pacini, MD;  Location: [facility];  Service: General;  Laterality: Right;   RADIOLOGY WITH ANESTHESIA N/A 01/09/2014   Procedure: Embolization;  Surgeon: Gerldine Maizes, MD;  Location: [facility] OR;  Service: Radiology;  Laterality: N/A;   SPLENECTOMY, TOTAL  2009 approx.   splen injury due to fall off horse   TONSILLECTOMY  child   TRANSURETHRAL RESECTION OF BLADDER TUMOR N/A 11/07/2017   Procedure: TRANSURETHRAL RESECTION OF BLADDER TUMOR (TURBT), POSSIBLE STENT PLACEMENT;  Surgeon: Sherrilee Belvie CROME, MD;  Location: [facility];  Service: Urology;  Laterality: N/A;   TRANSURETHRAL RESECTION OF BLADDER TUMOR N/A 12/14/2017   Procedure: TRANSURETHRAL RESECTION OF BLADDER TUMOR (TURBT);  Surgeon: Sherrilee Belvie CROME, MD;  Location: [facility];  Service: Urology;  Laterality: N/A;  30 MINS   TRANSURETHRAL RESECTION OF BLADDER TUMOR N/A 04/27/2018   Procedure: TRANSURETHRAL RESECTION OF BLADDER TUMOR (TURBT);  Surgeon: Sherrilee Belvie CROME, MD;  Location: [facility];  Service: Urology;  Laterality: N/A;  30 MINS   VIDEO BRONCHOSCOPY WITH ENDOBRONCHIAL NAVIGATION Bilateral 10/02/2023   Procedure: VIDEO BRONCHOSCOPY WITH ENDOBRONCHIAL NAVIGATION;  Surgeon: Shelah Lamar RAMAN, MD;  Location[facility] ENDOSCOPY;  Service: Pulmonary;  Laterality: Bilateral;   VIDEO BRONCHOSCOPY WITH RADIAL ENDOBRONCHIAL ULTRASOUND  04/06/2021   Procedure: RADIAL ENDOBRONCHIAL ULTRASOUND;  Surgeon: Brenna Adine CROME, DO;  Location: [facility]  ENDOSCOPY;  Service: Pulmonary;;    I have reviewed the social history and family history with the patient and they are unchanged from previous note.  ALLERGIES:  is allergic to feraheme  [ferumoxytol ].  MEDICATIONS:  Current Outpatient Medications  Medication Sig Dispense Refill   acetaminophen  (TYLENOL ) 500 MG tablet Take 1,000 mg by mouth daily.     aspirin  EC 81 MG tablet Take 81 mg by mouth daily. Swallow whole.     Cyanocobalamin  (VITAMIN B 12 PO) Take 1 tablet by mouth 3 (three) times a week. Take 1 tablet by mouth 3 times a week. Take on Monday, Wednesday and Friday     doxycycline (VIBRA-TABS) 100 MG tablet Take 1 tablet (100 mg total) by mouth 2 (two) times daily for 7 days. 14 tablet 0   folic acid  (FOLVITE ) 1 MG  tablet Take 1 tablet (1 mg total) by mouth daily. Start 7 days before pemetrexed chemotherapy. Continue until 21 days after pemetrexed completed. 100 tablet 3   glucose blood (TRUE METRIX BLOOD GLUCOSE TEST) test strip Use as instructed to test blood sugar once daily E08.638 100 each 2   lisinopril  (ZESTRIL ) 20 MG tablet Take 1 tablet (20 mg total) by mouth daily. 90 tablet 3   ondansetron  (ZOFRAN ) 8 MG tablet Take 1 tablet (8 mg total) by mouth every 8 (eight) hours as needed for nausea or vomiting. Start on the third day after carboplatin. 30 tablet 1   pravastatin  (PRAVACHOL ) 20 MG tablet Take 1 tablet (20 mg total) by mouth every evening. 90 tablet 1   prochlorperazine (COMPAZINE) 10 MG tablet Take 1 tablet (10 mg total) by mouth every 6 (six) hours as needed for nausea or vomiting. 30 tablet 1   No current facility-administered medications for this visit.   Facility-Administered Medications Ordered in Other Visits  Medication Dose Route Frequency Provider Last Rate Last Admin   0.9 %  sodium chloride  infusion   Intravenous Continuous Lanny Callander, MD 10 mL/hr at 12/04/23 1546 New Bag at 12/04/23 1546   cemiplimab-rwlc (LIBTAYO) 350 mg in sodium chloride  0.9 % 100 mL  chemo infusion  350 mg Intravenous Once Lanny Callander, MD       PEMEtrexed Disodium (ALIMTA) 800 mg in sodium chloride  0.9 % 100 mL chemo infusion  400 mg/m2 (Treatment Plan Recorded) Intravenous Once Lanny Callander, MD 792 mL/hr at 12/04/23 1623 800 mg at 12/04/23 1623    PHYSICAL EXAMINATION: ECOG PERFORMANCE STATUS: 0 - Asymptomatic  Vitals:   12/04/23 1437  BP: 132/70  Pulse: 84  Resp: 18  Temp: 97.6 F (36.4 C)  SpO2: 97%   Wt Readings from Last 3 Encounters:  12/04/23 192 lb 4.8 oz (87.2 kg)  11/27/23 194 lb 12.8 oz (88.4 kg)  11/08/23 193 lb (87.5 kg)     GENERAL:alert, no distress and comfortable SKIN: skin color, texture, turgor are normal, no rashes or significant lesions EYES: normal, Conjunctiva are pink and non-injected, sclera clear NECK: supple, thyroid  normal size, non-tender, without nodularity LYMPH:  no palpable lymphadenopathy in the cervical, axillary  LUNGS: clear to auscultation and percussion with normal breathing effort HEART: regular rate & rhythm and no murmurs and no lower extremity edema ABDOMEN:abdomen soft, non-tender and normal bowel sounds Musculoskeletal:no cyanosis of digits and no clubbing  NEURO: alert & oriented x 3 with fluent speech, no focal motor/sensory deficits  Physical Exam    LABORATORY DATA:  I have reviewed the data as listed    Latest Ref Rng & Units 12/04/2023    1:54 PM 11/03/2023   11:04 AM 10/02/2023    6:04 AM  CBC  WBC 4.0 - 10.5 K/uL 9.9  9.8  9.0   Hemoglobin 12.0 - 15.0 g/dL 84.3  84.5  84.8   Hematocrit 36.0 - 46.0 % 47.5  46.6  46.3   Platelets 150 - 400 K/uL 292  314  274         Latest Ref Rng & Units 12/04/2023    1:54 PM 11/03/2023   11:04 AM 10/02/2023    6:04 AM  CMP  Glucose 70 - 99 mg/dL 894  870  857   BUN 8 - 23 mg/dL 31  27  26    Creatinine 0.44 - 1.00 mg/dL 8.39  8.32  8.33   Sodium 135 - 145 mmol/L 139  140  138   Potassium 3.5 - 5.1 mmol/L 5.6  5.6  4.8   Chloride 98 - 111 mmol/L 106  106  109    CO2 22 - 32 mmol/L 28  29  23    Calcium  8.9 - 10.3 mg/dL 89.8  9.7  9.1   Total Protein 6.5 - 8.1 g/dL 7.3  7.0  6.4   Total Bilirubin 0.0 - 1.2 mg/dL 0.3  0.3  0.6   Alkaline Phos 38 - 126 U/L 98  100  77   AST 15 - 41 U/L 13  11  16    ALT 0 - 44 U/L 7  7  12        RADIOGRAPHIC STUDIES: I have personally reviewed the radiological images as listed and agreed with the findings in the report. No results found.    No orders of the defined types were placed in this encounter.  All questions were answered. The patient knows to call the clinic with any problems, questions or concerns. No barriers to learning was detected. The total time spent in the appointment was 40 minutes, including review of chart and various tests results, discussions about plan of care and coordination of care plan     Onita Mattock, MD 12/04/2023

## 2023-12-04 NOTE — Assessment & Plan Note (Signed)
-  Diagnosed in 09/2023, stage IV with bone mets, KRAS G12V mutation (+) -.  During her CT surveillance for her colon cancer in 2022, she was found to have multiple lung nodules.  The left upper lung nodule size slightly increased in 2023, she underwent bronchoscopy biopsy which was negative.  Due to the further increase in the size in 2025, she had a repeated bronchoscopy biopsy in August 2025, which confirmed adenocarcinoma.  The immunostain studies do not support colorectal primary, and a favor lung or upper GI primary.  EGD and colonoscopy were negative for malignancy. -PET scan in September 2025 unfortunately showed a oligo bone metastasis in right scapula.  She was referred to see thoracic surgeon Dr. Kerrin, surgery was not offered due to the metastatic disease. -Guardant360 was negative for targetable mutation -I recommend chemotherapy with single agent pemetrexed, and Keytruda  if PD-L1 expression more than 1%. -

## 2023-12-05 LAB — TSH: TSH: 1.84 u[IU]/mL (ref 0.350–4.500)

## 2023-12-05 LAB — T4: T4, Total: 7.9 ug/dL (ref 4.5–12.0)

## 2023-12-06 ENCOUNTER — Ambulatory Visit: Admitting: Nurse Practitioner

## 2023-12-06 ENCOUNTER — Encounter: Payer: Self-pay | Admitting: Nurse Practitioner

## 2023-12-06 ENCOUNTER — Other Ambulatory Visit: Payer: Self-pay | Admitting: Nurse Practitioner

## 2023-12-06 VITALS — BP 132/72 | HR 86 | Temp 97.2°F | Ht 64.0 in | Wt 189.8 lb

## 2023-12-06 DIAGNOSIS — E782 Mixed hyperlipidemia: Secondary | ICD-10-CM

## 2023-12-06 DIAGNOSIS — L03116 Cellulitis of left lower limb: Secondary | ICD-10-CM | POA: Diagnosis not present

## 2023-12-06 MED ORDER — MUPIROCIN 2 % EX OINT
1.0000 | TOPICAL_OINTMENT | Freq: Two times a day (BID) | CUTANEOUS | 0 refills | Status: AC
Start: 2023-12-06 — End: ?

## 2023-12-06 MED ORDER — DOXYCYCLINE HYCLATE 100 MG PO TABS: 100.0000 mg | ORAL_TABLET | Freq: Two times a day (BID) | ORAL | 0 refills | Status: AC

## 2023-12-06 NOTE — Patient Instructions (Signed)
 Continue to clean with soap and water. Complete another week of doxycycline Also apply Bactroban ointment BID x 10days, then stop. Return to office if no improvement in 10days.

## 2023-12-06 NOTE — Progress Notes (Signed)
 Established Patient Visit  Patient: Briana Ochoa   DOB: 11/18/1940   83 y.o. Female  MRN: [id number] Visit Date: 12/06/2023  Subjective:    Chief Complaint  Patient presents with   Follow-up    1 week follow up for leg edema    HPI Mrs. Tretter is here for re eval of left LE cellulitis and edema. She denies any pain or fever. Negative venous doppler on 11/29/2023. She completed 7days course of doxycycline 100mg  BID on 12/04/2023. She denies any adverse effects. Today she reports resolved edema, but persistent redness and serous drainage from wound.  Reviewed medical, surgical, and social history today  Medications: Outpatient Medications Prior to Visit  Medication Sig   acetaminophen  (TYLENOL ) 500 MG tablet Take 1,000 mg by mouth daily.   aspirin  EC 81 MG tablet Take 81 mg by mouth daily. Swallow whole.   Cyanocobalamin  (VITAMIN B 12 PO) Take 1 tablet by mouth 3 (three) times a week. Take 1 tablet by mouth 3 times a week. Take on Monday, Wednesday and Friday   folic acid  (FOLVITE ) 1 MG tablet Take 1 tablet (1 mg total) by mouth daily. Start 7 days before pemetrexed chemotherapy. Continue until 21 days after pemetrexed completed.   glucose blood (TRUE METRIX BLOOD GLUCOSE TEST) test strip Use as instructed to test blood sugar once daily E08.638   lisinopril  (ZESTRIL ) 20 MG tablet Take 1 tablet (20 mg total) by mouth daily.   ondansetron  (ZOFRAN ) 8 MG tablet Take 1 tablet (8 mg total) by mouth every 8 (eight) hours as needed for nausea or vomiting. Start on the third day after carboplatin.   pravastatin  (PRAVACHOL ) 20 MG tablet Take 1 tablet (20 mg total) by mouth every evening.   prochlorperazine (COMPAZINE) 10 MG tablet Take 1 tablet (10 mg total) by mouth every 6 (six) hours as needed for nausea or vomiting.   No facility-administered medications prior to visit.   Reviewed past medical and social history.   ROS per HPI above      Objective:  BP 132/72  (BP Location: Left Arm, Patient Position: Sitting, Cuff Size: Large)   Pulse 86   Temp (!) 97.2 F (36.2 C) (Temporal)   Ht 5' 4 (1.626 m)   Wt 189 lb 12.8 oz (86.1 kg)   SpO2 94%   BMI 32.58 kg/m      Physical Exam Vitals reviewed.  Musculoskeletal:     Right lower leg: No edema.     Left lower leg: No edema.       Legs:     Comments: Open wound with surrounding erythema and scat serous drainage, no induration, no fluctuance.  Skin:    Findings: Erythema present.  Neurological:     Mental Status: She is alert.     No results found for any visits on 12/06/23.    Assessment & Plan:    Problem List Items Addressed This Visit   None Visit Diagnoses       Cellulitis of left lower extremity    -  Primary   Relevant Medications   mupirocin ointment (BACTROBAN) 2 %   doxycycline (VIBRA-TABS) 100 MG tablet     Advised to Continue to clean with soap and water, Complete another week of doxycycline, apply Bactroban ointment BID x 10days, then stop. Return to office if no improvement in 10days.  Return if symptoms worsen or fail to improve.  Roselie Mood, NP

## 2023-12-08 ENCOUNTER — Ambulatory Visit: Admitting: Emergency Medicine

## 2023-12-11 ENCOUNTER — Encounter: Payer: Self-pay | Admitting: Radiology

## 2023-12-18 ENCOUNTER — Encounter: Payer: Self-pay | Admitting: Hematology

## 2023-12-21 ENCOUNTER — Encounter: Payer: Self-pay | Admitting: Nurse Practitioner

## 2023-12-21 NOTE — Telephone Encounter (Signed)
 Tried calling patient to get scheduled for an appointment and no answer the telephone just rings.

## 2023-12-22 NOTE — Telephone Encounter (Signed)
 Called patient and made her aware of the upcoming appointment for Monday 12/25/23 at 9:00 AM. Patient verbalized understanding and all (if any) questions were answered.

## 2023-12-25 ENCOUNTER — Ambulatory Visit: Admitting: Nurse Practitioner

## 2023-12-25 ENCOUNTER — Encounter: Payer: Self-pay | Admitting: Nurse Practitioner

## 2023-12-25 VITALS — BP 134/76 | HR 95 | Temp 97.8°F | Ht 64.0 in | Wt 192.6 lb

## 2023-12-25 DIAGNOSIS — I83029 Varicose veins of left lower extremity with ulcer of unspecified site: Secondary | ICD-10-CM | POA: Diagnosis not present

## 2023-12-25 DIAGNOSIS — I872 Venous insufficiency (chronic) (peripheral): Secondary | ICD-10-CM | POA: Diagnosis not present

## 2023-12-25 DIAGNOSIS — L97929 Non-pressure chronic ulcer of unspecified part of left lower leg with unspecified severity: Secondary | ICD-10-CM | POA: Diagnosis not present

## 2023-12-25 NOTE — Assessment & Plan Note (Signed)
-  Diagnosed in 09/2023, stage IV with bone mets, KRAS G12V mutation (+) -.  During her CT surveillance for her colon cancer in 2022, she was found to have multiple lung nodules.  The left upper lung nodule size slightly increased in 2023, she underwent bronchoscopy biopsy which was negative.  Due to the further increase in the size in 2025, she had a repeated bronchoscopy biopsy in August 2025, which confirmed adenocarcinoma.  The immunostain studies do not support colorectal primary, and a favor lung or upper GI primary.  EGD and colonoscopy were negative for malignancy. -PET scan in September 2025 unfortunately showed a oligo bone metastasis in right scapula.  She was referred to see thoracic surgeon Dr. Kerrin, surgery was not offered due to the metastatic disease. -Guardant360 was negative for targetable mutation -I recommend chemotherapy with single agent pemetrexed, and Keytruda  if PD-L1 expression more than 1%. -

## 2023-12-25 NOTE — Assessment & Plan Note (Addendum)
 Persistent Left LE swelling with hyperpigmentation and an ulcer on her shin x55months, non healing. Has complete 2 courses of doxycycline and also use Bactroban ointment x 1week Negative venous doppler 11/27/2023. Reports she unable to apply compression stocking.  Entered referral to wound clinic

## 2023-12-25 NOTE — Assessment & Plan Note (Signed)
 Persistent Left LE swelling with hyperpigmentation and an ulcer on her shin x55months, non healing. Has complete 2 courses of doxycycline and also use Bactroban ointment x 1week Negative venous doppler 11/27/2023. Reports she unable to apply compression stocking.  Entered referral to wound clinic

## 2023-12-25 NOTE — Progress Notes (Signed)
 Established Patient Visit  Patient: Briana Ochoa   DOB: 1940/10/01   83 y.o. Female  MRN: [id number] Visit Date: 12/25/2023  Subjective:    Chief Complaint  Patient presents with   cellutitis     Left leg still red and swollen since last visit    HPI Chronic venous insufficiency Persistent Left LE swelling with hyperpigmentation and an ulcer on her shin x62months, non healing. Has complete 2 courses of doxycycline and also use Bactroban ointment x 1week Negative venous doppler 11/27/2023. Reports she unable to apply compression stocking.  Entered referral to wound clinic  Venous ulcer of left leg (HCC) Persistent Left LE swelling with hyperpigmentation and an ulcer on her shin x49months, non healing. Has complete 2 courses of doxycycline and also use Bactroban ointment x 1week Negative venous doppler 11/27/2023. Reports she unable to apply compression stocking.  Entered referral to wound clinic  Reviewed medical, surgical, and social history today  Medications: Outpatient Medications Prior to Visit  Medication Sig   acetaminophen  (TYLENOL ) 500 MG tablet Take 1,000 mg by mouth daily.   aspirin  EC 81 MG tablet Take 81 mg by mouth daily. Swallow whole.   Cyanocobalamin  (VITAMIN B 12 PO) Take 1 tablet by mouth 3 (three) times a week. Take 1 tablet by mouth 3 times a week. Take on Monday, Wednesday and Friday   folic acid  (FOLVITE ) 1 MG tablet Take 1 tablet (1 mg total) by mouth daily. Start 7 days before pemetrexed chemotherapy. Continue until 21 days after pemetrexed completed.   glucose blood (TRUE METRIX BLOOD GLUCOSE TEST) test strip Use as instructed to test blood sugar once daily E08.638   lisinopril  (ZESTRIL ) 20 MG tablet Take 1 tablet (20 mg total) by mouth daily.   mupirocin ointment (BACTROBAN) 2 % Apply 1 Application topically 2 (two) times daily. Apply with dressing change   Na Sulfate-K Sulfate-Mg Sulfate concentrate (SUPREP) 17.5-3.13-1.6 GM/177ML  SOLN    ondansetron  (ZOFRAN ) 8 MG tablet Take 1 tablet (8 mg total) by mouth every 8 (eight) hours as needed for nausea or vomiting. Start on the third day after carboplatin.   pravastatin  (PRAVACHOL ) 20 MG tablet Take 1 tablet (20 mg total) by mouth every evening.   prochlorperazine (COMPAZINE) 10 MG tablet Take 1 tablet (10 mg total) by mouth every 6 (six) hours as needed for nausea or vomiting.   No facility-administered medications prior to visit.   Reviewed past medical and social history.   ROS per HPI above      Objective:  BP 134/76 (BP Location: Left Arm, Patient Position: Sitting, Cuff Size: Large)   Pulse 95   Temp 97.8 F (36.6 C) (Oral)   Ht 5' 4 (1.626 m)   Wt 192 lb 9.6 oz (87.4 kg)   SpO2 96%   BMI 33.06 kg/m      Physical Exam Vitals and nursing note reviewed.  Cardiovascular:     Pulses:          Dorsalis pedis pulses are 2+ on the right side and 2+ on the left side.       Posterior tibial pulses are 2+ on the right side and 2+ on the left side.  Musculoskeletal:     Right lower leg: No edema.     Left lower leg: Edema present.  Skin:    Findings: No rash.  Neurological:     Mental Status: She is alert  and oriented to person, place, and time.     No results found for any visits on 12/25/23.    Assessment & Plan:    Problem List Items Addressed This Visit     Chronic venous insufficiency   Persistent Left LE swelling with hyperpigmentation and an ulcer on her shin x46months, non healing. Has complete 2 courses of doxycycline and also use Bactroban ointment x 1week Negative venous doppler 11/27/2023. Reports she unable to apply compression stocking.  Entered referral to wound clinic      Relevant Orders   Ambulatory referral to Wound Clinic   Venous ulcer of left leg (HCC) - Primary   Persistent Left LE swelling with hyperpigmentation and an ulcer on her shin x18months, non healing. Has complete 2 courses of doxycycline and also use Bactroban  ointment x 1week Negative venous doppler 11/27/2023. Reports she unable to apply compression stocking.  Entered referral to wound clinic      Relevant Orders   Ambulatory referral to Wound Clinic   Return if symptoms worsen or fail to improve.     Roselie Mood, NP

## 2023-12-25 NOTE — Patient Instructions (Signed)
 Wear compression stocking if possible. You will be contacted to schedule appointment with wound care clinic

## 2023-12-26 ENCOUNTER — Inpatient Hospital Stay: Attending: Hematology

## 2023-12-26 ENCOUNTER — Inpatient Hospital Stay: Admitting: Hematology

## 2023-12-26 ENCOUNTER — Inpatient Hospital Stay

## 2023-12-26 VITALS — BP 151/65 | HR 85 | Temp 97.7°F | Resp 18 | Wt 192.0 lb

## 2023-12-26 DIAGNOSIS — R600 Localized edema: Secondary | ICD-10-CM | POA: Diagnosis not present

## 2023-12-26 DIAGNOSIS — M7989 Other specified soft tissue disorders: Secondary | ICD-10-CM | POA: Diagnosis not present

## 2023-12-26 DIAGNOSIS — Z79620 Long term (current) use of immunosuppressive biologic: Secondary | ICD-10-CM | POA: Diagnosis not present

## 2023-12-26 DIAGNOSIS — C3490 Malignant neoplasm of unspecified part of unspecified bronchus or lung: Secondary | ICD-10-CM | POA: Insufficient documentation

## 2023-12-26 DIAGNOSIS — Z5112 Encounter for antineoplastic immunotherapy: Secondary | ICD-10-CM | POA: Insufficient documentation

## 2023-12-26 DIAGNOSIS — Z85038 Personal history of other malignant neoplasm of large intestine: Secondary | ICD-10-CM | POA: Diagnosis not present

## 2023-12-26 DIAGNOSIS — C7951 Secondary malignant neoplasm of bone: Secondary | ICD-10-CM | POA: Insufficient documentation

## 2023-12-26 DIAGNOSIS — Z5111 Encounter for antineoplastic chemotherapy: Secondary | ICD-10-CM | POA: Insufficient documentation

## 2023-12-26 LAB — CBC WITH DIFFERENTIAL (CANCER CENTER ONLY)
Abs Immature Granulocytes: 0.16 K/uL — ABNORMAL HIGH (ref 0.00–0.07)
Basophils Absolute: 0.1 K/uL (ref 0.0–0.1)
Basophils Relative: 1 %
Eosinophils Absolute: 0.2 K/uL (ref 0.0–0.5)
Eosinophils Relative: 2 %
HCT: 42.6 % (ref 36.0–46.0)
Hemoglobin: 13.9 g/dL (ref 12.0–15.0)
Immature Granulocytes: 2 %
Lymphocytes Relative: 16 %
Lymphs Abs: 1.6 K/uL (ref 0.7–4.0)
MCH: 31.1 pg (ref 26.0–34.0)
MCHC: 32.6 g/dL (ref 30.0–36.0)
MCV: 95.3 fL (ref 80.0–100.0)
Monocytes Absolute: 1.4 K/uL — ABNORMAL HIGH (ref 0.1–1.0)
Monocytes Relative: 13 %
Neutro Abs: 7 K/uL (ref 1.7–7.7)
Neutrophils Relative %: 66 %
Platelet Count: 549 K/uL — ABNORMAL HIGH (ref 150–400)
RBC: 4.47 MIL/uL (ref 3.87–5.11)
RDW: 15 % (ref 11.5–15.5)
WBC Count: 10.4 K/uL (ref 4.0–10.5)
nRBC: 0 % (ref 0.0–0.2)

## 2023-12-26 LAB — CMP (CANCER CENTER ONLY)
ALT: 24 U/L (ref 0–44)
AST: 34 U/L (ref 15–41)
Albumin: 3.9 g/dL (ref 3.5–5.0)
Alkaline Phosphatase: 124 U/L (ref 38–126)
Anion gap: 12 (ref 5–15)
BUN: 24 mg/dL — ABNORMAL HIGH (ref 8–23)
CO2: 24 mmol/L (ref 22–32)
Calcium: 9.6 mg/dL (ref 8.9–10.3)
Chloride: 103 mmol/L (ref 98–111)
Creatinine: 1.46 mg/dL — ABNORMAL HIGH (ref 0.44–1.00)
GFR, Estimated: 35 mL/min — ABNORMAL LOW (ref >60)
Glucose, Bld: 144 mg/dL — ABNORMAL HIGH (ref 70–99)
Potassium: 4.7 mmol/L (ref 3.5–5.1)
Sodium: 140 mmol/L (ref 135–145)
Total Bilirubin: 0.3 mg/dL (ref 0.0–1.2)
Total Protein: 7.4 g/dL (ref 6.5–8.1)

## 2023-12-26 LAB — TSH: TSH: 2.16 u[IU]/mL (ref 0.350–4.500)

## 2023-12-26 MED ORDER — PROCHLORPERAZINE MALEATE 10 MG PO TABS
10.0000 mg | ORAL_TABLET | Freq: Once | ORAL | Status: AC
  Administered 2023-12-26: 10 mg via ORAL
  Filled 2023-12-26: qty 1

## 2023-12-26 MED ORDER — CYANOCOBALAMIN 1000 MCG/ML IJ SOLN: 1000.0000 ug | Freq: Once | INTRAMUSCULAR | Status: DC

## 2023-12-26 MED ORDER — SODIUM CHLORIDE 0.9 % IV SOLN: INTRAVENOUS | Status: DC

## 2023-12-26 MED ORDER — SODIUM CHLORIDE 0.9 % IV SOLN
400.0000 mg/m2 | Freq: Once | INTRAVENOUS | Status: AC
  Administered 2023-12-26: 800 mg via INTRAVENOUS
  Filled 2023-12-26: qty 32

## 2023-12-26 MED ORDER — SODIUM CHLORIDE 0.9 % IV SOLN
350.0000 mg | Freq: Once | INTRAVENOUS | Status: AC
  Administered 2023-12-26: 350 mg via INTRAVENOUS
  Filled 2023-12-26: qty 7

## 2023-12-26 NOTE — Patient Instructions (Signed)
 [facility] - A DEPT OF [facility]  Discharge Instructions:  Doppler Scan tomorrow- [facility] across from [facility] the address is [address] [place], [place] [phone]   Thank you for choosing [facility] to provide your oncology and hematology care.   If you have a lab appointment with the Cancer Center, please go directly to the Cancer Center and check in at the registration area.   Wear comfortable clothing and clothing appropriate for easy access to any Portacath or PICC line.   We strive to give you quality time with your provider. You may need to reschedule your appointment if you arrive late (15 or more minutes).  Arriving late affects you and other patients whose appointments are after yours.  Also, if you miss three or more appointments without notifying the office, you may be dismissed from the clinic at the provider's discretion.      For prescription refill requests, have your pharmacy contact our office and allow 72 hours for refills to be completed.    Today you received the following chemotherapy and/or immunotherapy agents alimta and libtayo      To help prevent nausea and vomiting after your treatment, we encourage you to take your nausea medication as directed.  BELOW ARE SYMPTOMS THAT SHOULD BE REPORTED IMMEDIATELY: *FEVER GREATER THAN 100.4 F (38 C) OR HIGHER *CHILLS OR SWEATING *NAUSEA AND VOMITING THAT IS NOT CONTROLLED WITH YOUR NAUSEA MEDICATION *UNUSUAL SHORTNESS OF BREATH *UNUSUAL BRUISING OR BLEEDING *URINARY PROBLEMS (pain or burning when urinating, or frequent urination) *BOWEL PROBLEMS (unusual diarrhea, constipation, pain near the anus) TENDERNESS IN MOUTH AND THROAT WITH OR WITHOUT PRESENCE OF ULCERS (sore throat, sores in mouth, or a toothache) UNUSUAL RASH, SWELLING OR PAIN  UNUSUAL VAGINAL DISCHARGE OR ITCHING   Items with * indicate a potential emergency and should be  followed up as soon as possible or go to the Emergency Department if any problems should occur.  Please show the CHEMOTHERAPY ALERT CARD or IMMUNOTHERAPY ALERT CARD at check-in to the Emergency Department and triage nurse.  Should you have questions after your visit or need to cancel or reschedule your appointment, please contact [facility] - A DEPT OF [facility]  Dept: [phone]  and follow the prompts.  Office hours are 8:00 a.m. to 4:30 p.m. Monday - Friday. Please note that voicemails left after 4:00 p.m. may not be returned until the following business day.  We are closed weekends and major holidays. You have access to a nurse at all times for urgent questions. Please call the main number to the clinic Dept: [phone] and follow the prompts.   For any non-urgent questions, you may also contact your provider using MyChart. We now offer e-Visits for anyone 25 and older to request care online for non-urgent symptoms. For details visit [email or web address].   Also download the MyChart app! Go to the app store, search MyChart, open the app, select [place], and log in with your MyChart username and password.

## 2023-12-26 NOTE — Progress Notes (Signed)
 [facility]   [phone] [phone]   Clinic Follow up Note   Patient Care Team: Nche, Roselie Rockford, NP as PCP - General (Internal Medicine) Odean Potts, MD as Consulting Physician (Hematology and Oncology) Rosemarie Eather RAMAN, MD as Consulting Physician (Neurology) Lanis Pupa, MD as Consulting Physician (Neurosurgery) Charmayne Molly, MD as Consulting Physician (Ophthalmology) Lanny Callander, MD as Consulting Physician (Hematology and Oncology) Debby Hila, MD as Consulting Physician (General Surgery)  Date of Service:  12/26/2023  CHIEF COMPLAINT: f/u of metastatic non-small cell lung cancer  CURRENT THERAPY:  First-line chemotherapy pemetrexed and Keytruda  every 3 weeks  Oncology History   NSCLC metastatic to bone [facility]) -Diagnosed in 09/2023, stage IV with bone mets, KRAS G12V mutation (+) -.  During her CT surveillance for her colon cancer in 2022, she was found to have multiple lung nodules.  The left upper lung nodule size slightly increased in 2023, she underwent bronchoscopy biopsy which was negative.  Due to the further increase in the size in 2025, she had a repeated bronchoscopy biopsy in August 2025, which confirmed adenocarcinoma.  The immunostain studies do not support colorectal primary, and a favor lung or upper GI primary.  EGD and colonoscopy were negative for malignancy. -PET scan in September 2025 unfortunately showed a oligo bone metastasis in right scapula.  She was referred to see thoracic surgeon Dr. Kerrin, surgery was not offered due to the metastatic disease. -Guardant360 was negative for targetable mutation -I recommend chemotherapy with single agent pemetrexed, and Keytruda  if PD-L1 expression more than 1%.  Assessment & Plan Malignant neoplasm of lung undergoing chemotherapy Undergoing chemotherapy with Alimta and Keytruda . Reports nausea and fatigue post-treatment, but no diarrhea. Symptoms improved by the end  of the week. Blood counts are stable, and other lab results are pending. Treatment is well-tolerated, with expected increased difficulty after the second and third cycles due to incomplete recovery. - Continue chemotherapy with Alimta and Keytruda  every three weeks. - Scheduled next chemotherapy session for December 8th, 2025. - Plan to repeat scan after the fourth cycle of chemotherapy. - Plan to repeat PET scan to assess remaining cancer burden.  Chronic left lower extremity swelling and tenderness, status post treatment for cellulitis  Chronic swelling and tenderness in the left lower extremity, status post infection. Swelling and tenderness persist despite two rounds of antibiotics. Previous ultrasound negative for blood clots. Referral to wound clinic made, but appointment not yet scheduled. Swelling and tenderness are concerning, and a repeat ultrasound is planned to rule out missed blood clots. - Ordered repeat ultrasound of the left lower extremity to rule out blood clots. - Coordinated with nurse to schedule appointment with specialist for further evaluation. - Monitor wound healing and manage symptoms as needed.  Plan - She has recovered well from first cycle chemotherapy, lab reviewed, adequate for treatment, will proceed to second cycle chemo today continue every 3 weeks - Persistent left lower extremity edema and tenderness, will obtain a repeated Doppler tomorrow to rule out DVT.  Patient has been referred to wound clinic by her PCP. - Lab, follow-up and cycle 3 chemotherapy in 3 weeks -will repeat CT after cycle 4    SUMMARY OF ONCOLOGIC HISTORY: Oncology History Overview Note  Cancer Staging Cancer of left colon [facility]) Staging form: Colon and Rectum, AJCC 8th Edition - Pathologic stage from 05/28/2020: Stage IIA (pT3, pN0, cM0) - Signed by Lanny Callander, MD on 06/19/2020 Stage prefix: Initial diagnosis Total positive nodes: 0 Histologic grading  system: 4 grade system Histologic  grade (G): G3 Residual tumor (R): R0 - None  Ductal carcinoma in situ (DCIS) of left breast Staging form: Breast, AJCC 7th Edition - Clinical: Stage 0 (Tis (DCIS), N0, cM0) - Unsigned Specimen type: Core Needle Biopsy Histopathologic type: 9932 Laterality: Left Staging comments: Staged at breast conference 7.22.15  - Pathologic: Stage 0 (Tis (DCIS)(2), N0, cM0) - Unsigned Specimen type: Core Needle Biopsy Histopathologic type: 9932 Laterality: Left Tumor size (mm): 3 Multiple tumors: Yes Number of tumors: 2 Method of lymph node assessment: Clinical Method of detection of distant metastases: Clinical Residual tumor (R): R0 - None Estrogen receptor status: Positive Progesterone receptor status: Positive    Ductal carcinoma in situ (DCIS) of left breast  08/20/2013 Initial Diagnosis   Left breast 12:00: DCIS with calcifications, yet 100%, PR 100%; 09/04/2013 second group of calcifications also biopsied proven to be DCIS ER/PR positive   09/19/2013 Surgery   Left breast lumpectomy DCIS 2 foci margins negative, 0.2 and 0.3 cm ER 100% PR 100%   10/08/2013 - 11/12/2013 Radiation Therapy   Adjuvant radiation therapy   11/12/2013 - 2020 Anti-estrogen oral therapy   Anastrozole  1 mg daily plan is for 5 years   01/09/2014 - 01/10/2014 Hospital Admission   Pipeline embolization 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 The report date is 07/30/2020.   The CancerNext-Expanded + RNAinsight gene panel offered by [employer] and includes sequencing and rearrangement analysis for the following 77 genes: AIP, ALK, APC, ATM, AXIN2, BAP1, BARD1, BLM, BMPR1A, BRCA1, BRCA2, BRIP1, CDC73, CDH1, CDK4, CDKN1B, CDKN2A, CHEK2, CTNNA1, DICER1, FANCC, FH, FLCN, GALNT12, KIF1B, LZTR1, MAX, MEN1, MET, MLH1, MSH2, MSH3, MSH6, MUTYH, NBN, NF1, NF2, NTHL1, PALB2, PHOX2B, PMS2, POT1,  PRKAR1A, PTCH1, PTEN, RAD51C, RAD51D, RB1, RECQL, RET, SDHA, SDHAF2, SDHB, SDHC, SDHD, SMAD4, SMARCA4, SMARCB1, SMARCE1, STK11, SUFU, TMEM127, TP53, TSC1, TSC2, VHL and XRCC2 (sequencing and deletion/duplication); EGFR, EGLN1, HOXB13, KIT, MITF, PDGFRA, POLD1 and POLE (sequencing only); EPCAM and GREM1 (deletion/duplication only). RNA data is routinely analyzed for use in variant interpretation for all genes.   Bladder cancer (HCC)  02/09/2018 Initial Diagnosis   Bladder cancer (HCC)   07/30/2020 Genetic Testing   Negative genetic testing:  No pathogenic variants detected on the [employer] CancerNext-Expanded + RNAinsight panel. The report date is 07/30/2020.   The CancerNext-Expanded + RNAinsight gene panel offered by [employer] and includes sequencing and rearrangement analysis for the following 77 genes: AIP, ALK, APC, ATM, AXIN2, BAP1, BARD1, BLM, BMPR1A, BRCA1, BRCA2, BRIP1, CDC73, CDH1, CDK4, CDKN1B, CDKN2A, CHEK2, CTNNA1, DICER1, FANCC, FH, FLCN, GALNT12, KIF1B, LZTR1, MAX, MEN1, MET, MLH1, MSH2, MSH3, MSH6, MUTYH, NBN, NF1, NF2, NTHL1, PALB2, PHOX2B, PMS2, POT1, PRKAR1A, PTCH1, PTEN, RAD51C, RAD51D, RB1, RECQL, RET, SDHA, SDHAF2, SDHB, SDHC, SDHD, SMAD4, SMARCA4, SMARCB1, SMARCE1, STK11, SUFU, TMEM127, TP53, TSC1, TSC2, VHL and XRCC2 (sequencing and deletion/duplication); EGFR, EGLN1, HOXB13, KIT, MITF, PDGFRA, POLD1 and POLE (sequencing only); EPCAM and GREM1 (deletion/duplication only). RNA data is routinely analyzed for use in variant interpretation for all genes.   08/31/2020 Imaging   CT CAP w/o contrast  IMPRESSION: 1. No specific findings of active malignancy on today's noncontrast CT. There has been interval partial colectomy involving the tumor site with reanastomosis. Some faint stranding in the adjacent omentum is probably from scarring or fat necrosis, less likely due to early tumor given the lack of overt  nodularity. Surveillance imaging is likely indicated, and PET-CT may have a  role in imaging follow up. 2. Several small pulmonary nodules, in addition to a larger mixed density nodule in the left upper lobe. Surveillance is recommended. 3. Other imaging findings of potential clinical significance: Aortic Atherosclerosis (ICD10-I70.0). Coronary atherosclerosis. Mitral valve calcification. Small type 1 hiatal hernia. Splenectomy with small amount of regenerative splenic tissue as well as a venous varix arising from the splenic vein. Hyperdense left kidney lower pole exophytic homogeneous lesion, probably a complex cyst. Sigmoid colon diverticulosis. Multilevel lumbar spondylosis and degenerative disc disease.   Cancer of left colon (HCC)  04/23/2020 Procedure   Upper Endoscopy and Colonoscopy by Dr Avram IMPRESSION Erythematous mucosa in the antrum. Biopsied. - Small hiatal hernia. - Gastroesophageal flap valve classified as Hill Grade IV (no fold, wide open lumen, hiatal hernia present). - The examination was otherwise normal.   IMPRESSION - Decreased sphincter tone found on digital rectal exam. - Malignant partially obstructing tumor in the proximal descending colon. Biopsied. Tattooed. - Three 5 to 20 mm polyps in the sigmoid colon, removed piecemeal using a cold snare. Resected and retrieved. - Severe diverticulosis in the sigmoid colon. - I left several diminutive descending (proximal to tattoo), sigmoid and rectal polyps - The examination was otherwise normal on direct and retroflexion views.     04/23/2020 Initial Diagnosis   Diagnosis 1. Descending Colon Polyp, mass - ADENOCARCINOMA. 2. Sigmoid Colon Polyp, (3) - MULTIPLE FRAGMENTS OF TUBULAR ADENOMA. - MULTIPLE FRAGMENTS OF SESSILE SERRATED POLYP WITHOUT DYSPLASIA. - NO HIGH GRADE DYSPLASIA OR MALIGNANCY. Microscopic Comment 1. Dr. Rebbecca has reviewed the case. Dr. Avram was notified on 04/24/2020. 2. Despite being left sided the fragments have features of a sessile serrated polyp, as  opposed to, hyperplastic polyp.   04/27/2020 Imaging   CT AP IMPRESSION: Bulky annular soft tissue mass involving the distal transverse colon with extension into adjacent pericolonic fat, consistent with primary colon carcinoma.   No evidence of metastatic disease.   Colonic diverticulosis. No radiographic evidence of diverticulitis.   Aortic Atherosclerosis (ICD10-I70.0).     05/05/2020 Imaging   CT Chest IMPRESSION: Bulky annular soft tissue mass involving the distal transverse colon with extension into adjacent pericolonic fat, consistent with primary colon carcinoma.   No evidence of metastatic disease.   Colonic diverticulosis. No radiographic evidence of diverticulitis.   Aortic Atherosclerosis (ICD10-I70.0).     05/28/2020 Initial Diagnosis   Cancer of left colon (HCC)   05/28/2020 Cancer Staging   Staging form: Colon and Rectum, AJCC 8th Edition - Pathologic stage from 05/28/2020: Stage IIA (pT3, pN0, cM0) - Signed by Lanny Callander, MD on 06/19/2020 Stage prefix: Initial diagnosis Total positive nodes: 0 Histologic grading system: 4 grade system Histologic grade (G): G3 Residual tumor (R): R0 - None   05/28/2020 Surgery   XI ROBOT ASSISTED RESECTION OF SPLENIC FLEXURE by Dr Debby    FINAL MICROSCOPIC DIAGNOSIS:   A. COLON, SPLENIC FLEXURE, RESECTION:  - Invasive adenocarcinoma, poorly differentiated, spanning 9.7 cm.  - Tumor invades through muscularis propria into pericolonic tissue.  - Resection margins are negative.  - Tubular adenoma (X1).  - Sessile serrate polyp without dysplasia (x2).  - Diverticulosis.  - Polypectomy scar.  - Twenty-four of twenty-four lymph nodes negative for carcinoma (0/24).  - See oncology table.    07/07/2020 Miscellaneous   Guardant Reveal ctDNA positive MSI high   07/30/2020 Genetic Testing   Negative genetic testing:  No pathogenic variants detected  on the [employer] CancerNext-Expanded + RNAinsight panel. The report date is  07/30/2020.   The CancerNext-Expanded + RNAinsight gene panel offered by [employer] and includes sequencing and rearrangement analysis for the following 77 genes: AIP, ALK, APC, ATM, AXIN2, BAP1, BARD1, BLM, BMPR1A, BRCA1, BRCA2, BRIP1, CDC73, CDH1, CDK4, CDKN1B, CDKN2A, CHEK2, CTNNA1, DICER1, FANCC, FH, FLCN, GALNT12, KIF1B, LZTR1, MAX, MEN1, MET, MLH1, MSH2, MSH3, MSH6, MUTYH, NBN, NF1, NF2, NTHL1, PALB2, PHOX2B, PMS2, POT1, PRKAR1A, PTCH1, PTEN, RAD51C, RAD51D, RB1, RECQL, RET, SDHA, SDHAF2, SDHB, SDHC, SDHD, SMAD4, SMARCA4, SMARCB1, SMARCE1, STK11, SUFU, TMEM127, TP53, TSC1, TSC2, VHL and XRCC2 (sequencing and deletion/duplication); EGFR, EGLN1, HOXB13, KIT, MITF, PDGFRA, POLD1 and POLE (sequencing only); EPCAM and GREM1 (deletion/duplication only). RNA data is routinely analyzed for use in variant interpretation for all genes.   08/31/2020 Imaging   CT CAP w/o contrast  IMPRESSION: 1. No specific findings of active malignancy on today's noncontrast CT. There has been interval partial colectomy involving the tumor site with reanastomosis. Some faint stranding in the adjacent omentum is probably from scarring or fat necrosis, less likely due to early tumor given the lack of overt nodularity. Surveillance imaging is likely indicated, and PET-CT may have a role in imaging follow up. 2. Several small pulmonary nodules, in addition to a larger mixed density nodule in the left upper lobe. Surveillance is recommended. 3. Other imaging findings of potential clinical significance: Aortic Atherosclerosis (ICD10-I70.0). Coronary atherosclerosis. Mitral valve calcification. Small type 1 hiatal hernia. Splenectomy with small amount of regenerative splenic tissue as well as a venous varix arising from the splenic vein. Hyperdense left kidney lower pole exophytic homogeneous lesion, probably a complex cyst. Sigmoid colon diverticulosis. Multilevel lumbar spondylosis and degenerative disc disease.    10/02/2020 - 09/17/2021 Chemotherapy   Patient is on Treatment Plan : COLORECTAL Pembrolizumab  q21d     03/01/2021 Imaging   EXAM: CT CHEST, ABDOMEN AND PELVIS WITHOUT CONTRAST  IMPRESSION: 1. Mixed attenuation lesion posterior left upper lobe has areas of more confluent and enlarging nodularity today. Interval progression raises concern for neoplasm. Consider PET-CT to further evaluate. 2. Stable 7 mm right lower lobe pulmonary nodule with a slightly more conspicuous 3 mm nodule in the right lower lobe. Continued close attention on follow-up recommended. 3. No evidence for metastatic disease in the abdomen or pelvis. 4. Aortic Atherosclerosis (ICD10-I70.0).   03/17/2021 PET scan   IMPRESSION: 1. Part solid nodule of the left upper lobe demonstrates mild FDG uptake, and has increased in size when compared with prior exam dated May 05, 2020. Concerning for indolent primary lung neoplasm. 2. No evidence of metastatic disease in the chest, abdomen or pelvis. 3. 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 Recommend further evaluation with tissue sampling. 4. Aortic Atherosclerosis (ICD10-I70.0).   03/15/2022 Imaging    IMPRESSION: Stable irregular solid pulmonary nodule in posterior left upper lobe.   No evidence of recurrent or metastatic disease within the abdomen or pelvis.   Colonic diverticulosis, without radiographic evidence of diverticulitis.   NSCLC metastatic to bone [facility])  10/02/2023 Cancer Staging   Staging form: Lung, AJCC V9 - Clinical stage from 10/02/2023: Richelle Canary, cM1 - Signed by Lanny Callander, MD on 11/20/2023   11/20/2023 Initial Diagnosis   NSCLC metastatic to bone (HCC)   12/04/2023 -  Chemotherapy   Patient is on Treatment Plan : Lung Pemetrexed (500) + Pembrolizumab  (200) D1 q21d        Discussed the use of AI scribe  software for clinical note transcription with the patient, who gave verbal consent  to proceed.  History of Present Illness Briana Ochoa is an 83 year old female with lung cancer who presents for follow-up.  She is undergoing chemotherapy with Alimta and Keytruda . Her last session was manageable. During the second week post-treatment, she experienced increased fatigue and nausea, requiring one anti-nausea pill. Despite these symptoms, she continues to work part-time, four hours a day, and reports feeling better in the past week. No diarrhea is present.  Her left leg, injured two months ago, remains swollen and tender, particularly around the ankle, with intermittent burning and stabbing pain. The area feels bruised to touch. An ultrasound last month was negative for blood clots. The wound is now small and shallow, but swelling and tenderness persist.     All other systems were reviewed with the patient and are negative.  MEDICAL HISTORY:  Past Medical History:  Diagnosis Date   Allergy    seasonal allergies   Anemia    on meds   Aortic atherosclerosis    Bladder cancer (HCC) 2020   Bladder tumor    Blood transfusion without reported diagnosis    Bloody diarrhea 09/29/2017   Breast cancer (HCC) 2015   Left Breast Cancer   Cataract    bilateral -sx   CKD (chronic kidney disease), stage III (HCC)    DM related   Colon cancer (HCC)    Diabetes mellitus type 2, diet-controlled (HCC)    diet controlled   Dyspnea 09/28/2017   Dysuria    Family history of adverse reaction to anesthesia    aunt- N/V    Family history of breast cancer    Family history of kidney cancer    Family history of throat cancer    Family history of uterine cancer    Fatigue 09/28/2017   Frequency of urination    Grade I diastolic dysfunction 08/06/2014   Noted on ECHO   Gross hematuria 09/29/2017   Heart murmur    since rheumatic fever as child   Hematuria    History of cardiomegaly 02/12/2005   Mild, noted on CXR   History of CVA (cerebrovascular accident) 08/05/2014    post op cerebral angiogram, cerebral thrombosis w/ cerebral infarction;  11-02-2017  per pt no residuals   History of malignant melanoma of skin 12/2015   s/p wide local excision right lower leg (per pt localized)   History of rheumatic fever as a child    Hyperlipidemia    on meds   Hypertension    on meds   Intracranial aneurysm dx 07/ 2015   paraophthalmic RICA aneurysm /   s/p  Pipeline embolization right ICA 01-09-2014   Jaundice 09/28/2017   Malignant neoplasm of central portion of left breast in female, estrogen receptor positive [facility]) oncologist-  dr odean   dx 07/ 2015--- DCIS, ER/PR positive-- s/p  left breast lumpectomy 09-19-2013,  completed radiation 11-12-2013,  started arimidex  11-12-2013   Melanoma (HCC)    righ tleg    OA (osteoarthritis)    knees, hands, R shoulder   Osteopenia 10/16/2012   Diffuse   Personal history of radiation therapy    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   Sigmoid diverticulosis    Stroke (HCC)    Weakness 09/28/2017   Wears dentures    upper   Wears glasses    Wears hearing aid in both ears     SURGICAL  HISTORY: Past Surgical History:  Procedure Laterality Date   BREAST LUMPECTOMY Left 2015   BREAST LUMPECTOMY WITH NEEDLE LOCALIZATION Left 09/19/2013   Procedure: LEFT BREAST LUMPECTOMY WITH DOUBLE WIRE BRACKETED  NEEDLE LOCALIZATION;  Surgeon: Elon CHRISTELLA Pacini, MD;  Location: [facility] OR;  Service: General;  Laterality: Left;   BRONCHIAL BIOPSY  04/06/2021   Procedure: BRONCHIAL BIOPSIES;  Surgeon: Brenna Adine CROME, DO;  Location: [facility];  Service: Pulmonary;;   BRONCHIAL BRUSHINGS  04/06/2021   Procedure: BRONCHIAL BRUSHINGS;  Surgeon: Brenna Adine CROME, DO;  Location: [facility];  Service: Pulmonary;;   CATARACT EXTRACTION W/ INTRAOCULAR LENS  IMPLANT, BILATERAL  06/2015   CESAREAN SECTION  1964   CHOLECYSTECTOMY OPEN  1970s   CYSTOSCOPY W/ URETERAL STENT PLACEMENT Left 11/07/2017   Procedure: CYSTOSCOPY WITH RETROGRADE  PYELOGRAM/URETERAL STENT PLACEMENT;  Surgeon: Sherrilee Belvie CROME, MD;  Location[facility];  Service: Urology;  Laterality: Left;   CYSTOSCOPY W/ URETERAL STENT PLACEMENT Bilateral 04/27/2018   Procedure: CYSTOSCOPY WITH RETROGRADE PYELOGRAM;  Surgeon: Sherrilee Belvie CROME, MD;  Location: [facility];  Service: Urology;  Laterality: Bilateral;   FINGER SURGERY     lt thumb  from dog bite   IR  NEPHROURETERAL CATH PLACE LEFT  04/28/2018   MELANOMA EXCISION Right 12/28/2015   Procedure: EXCISION MELANOMA RIGHT LOWER LEG;  Surgeon: Elon Pacini, MD;  Location: [facility];  Service: General;  Laterality: Right;   RADIOLOGY WITH ANESTHESIA N/A 01/09/2014   Procedure: Embolization;  Surgeon: Gerldine Maizes, MD;  Location: [facility] OR;  Service: Radiology;  Laterality: N/A;   SPLENECTOMY, TOTAL  2009 approx.   splen injury due to fall off horse   TONSILLECTOMY  child   TRANSURETHRAL RESECTION OF BLADDER TUMOR N/A 11/07/2017   Procedure: TRANSURETHRAL RESECTION OF BLADDER TUMOR (TURBT), POSSIBLE STENT PLACEMENT;  Surgeon: Sherrilee Belvie CROME, MD;  Location[facility];  Service: Urology;  Laterality: N/A;   TRANSURETHRAL RESECTION OF BLADDER TUMOR N/A 12/14/2017   Procedure: TRANSURETHRAL RESECTION OF BLADDER TUMOR (TURBT);  Surgeon: Sherrilee Belvie CROME, MD;  Location: [facility];  Service: Urology;  Laterality: N/A;  30 MINS   TRANSURETHRAL RESECTION OF BLADDER TUMOR N/A 04/27/2018   Procedure: TRANSURETHRAL RESECTION OF BLADDER TUMOR (TURBT);  Surgeon: Sherrilee Belvie CROME, MD;  Location: [facility];  Service: Urology;  Laterality: N/A;  30 MINS   VIDEO BRONCHOSCOPY WITH ENDOBRONCHIAL NAVIGATION Bilateral 10/02/2023   Procedure: VIDEO BRONCHOSCOPY WITH ENDOBRONCHIAL NAVIGATION;  Surgeon: Shelah Lamar RAMAN, MD;  Location: [facility] ENDOSCOPY;  Service: Pulmonary;  Laterality: Bilateral;   VIDEO BRONCHOSCOPY WITH RADIAL  ENDOBRONCHIAL ULTRASOUND  04/06/2021   Procedure: RADIAL ENDOBRONCHIAL ULTRASOUND;  Surgeon: Brenna Adine CROME, DO;  Location: [facility];  Service: Pulmonary;;    I have reviewed the social history and family history with the patient and they are unchanged from previous note.  ALLERGIES:  is allergic to feraheme  [ferumoxytol ].  MEDICATIONS:  Current Outpatient Medications  Medication Sig Dispense Refill   acetaminophen  (TYLENOL ) 500 MG tablet Take 1,000 mg by mouth daily.     aspirin  EC 81 MG tablet Take 81 mg by mouth daily. Swallow whole.     Cyanocobalamin  (VITAMIN B 12 PO) Take 1 tablet by mouth 3 (three) times a week. Take 1 tablet by mouth 3 times a week. Take on Monday, Wednesday and Friday     folic acid  (FOLVITE ) 1 MG tablet Take 1 tablet (1 mg total) by mouth  daily. Start 7 days before pemetrexed chemotherapy. Continue until 21 days after pemetrexed completed. 100 tablet 3   glucose blood (TRUE METRIX BLOOD GLUCOSE TEST) test strip Use as instructed to test blood sugar once daily E08.638 100 each 2   lisinopril  (ZESTRIL ) 20 MG tablet Take 1 tablet (20 mg total) by mouth daily. 90 tablet 3   mupirocin ointment (BACTROBAN) 2 % Apply 1 Application topically 2 (two) times daily. Apply with dressing change 15 g 0   Na Sulfate-K Sulfate-Mg Sulfate concentrate (SUPREP) 17.5-3.13-1.6 GM/177ML SOLN      ondansetron  (ZOFRAN ) 8 MG tablet Take 1 tablet (8 mg total) by mouth every 8 (eight) hours as needed for nausea or vomiting. Start on the third day after carboplatin. 30 tablet 1   pravastatin  (PRAVACHOL ) 20 MG tablet Take 1 tablet (20 mg total) by mouth every evening. 90 tablet 1   prochlorperazine (COMPAZINE) 10 MG tablet Take 1 tablet (10 mg total) by mouth every 6 (six) hours as needed for nausea or vomiting. 30 tablet 1   No current facility-administered medications for this visit.   Facility-Administered Medications Ordered in Other Visits  Medication Dose Route Frequency Provider  Last Rate Last Admin   0.9 %  sodium chloride  infusion   Intravenous Continuous Lanny Callander, MD   Stopped at 12/26/23 1557    PHYSICAL EXAMINATION: ECOG PERFORMANCE STATUS: 1 - Symptomatic but completely ambulatory  There were no vitals filed for this visit. Wt Readings from Last 3 Encounters:  12/26/23 192 lb (87.1 kg)  12/25/23 192 lb 9.6 oz (87.4 kg)  12/06/23 189 lb 12.8 oz (86.1 kg)     GENERAL:alert, no distress and comfortable SKIN: skin color, texture, turgor are normal, no rashes or significant lesions EYES: normal, Conjunctiva are pink and non-injected, sclera clear NECK: supple, thyroid  normal size, non-tender, without nodularity LYMPH:  no palpable lymphadenopathy in the cervical, axillary  LUNGS: clear to auscultation and percussion with normal breathing effort HEART: regular rate & rhythm and no murmurs  ABDOMEN:abdomen soft, non-tender and normal bowel sounds Musculoskeletal:no cyanosis of digits and no clubbing, (+) moderate pitting edema of left lower extremity up to knee, with tenderness NEURO: alert & oriented x 3 with fluent speech, no focal motor/sensory deficits  Physical Exam   LABORATORY DATA:  I have reviewed the data as listed    Latest Ref Rng & Units 12/26/2023   12:22 PM 12/04/2023    1:54 PM 11/03/2023   11:04 AM  CBC  WBC 4.0 - 10.5 K/uL 10.4  9.9  9.8   Hemoglobin 12.0 - 15.0 g/dL 86.0  84.3  84.5   Hematocrit 36.0 - 46.0 % 42.6  47.5  46.6   Platelets 150 - 400 K/uL 549  292  314         Latest Ref Rng & Units 12/26/2023   12:22 PM 12/04/2023    1:54 PM 11/03/2023   11:04 AM  CMP  Glucose 70 - 99 mg/dL 855  894  870   BUN 8 - 23 mg/dL 24  31  27    Creatinine 0.44 - 1.00 mg/dL 8.53  8.39  8.32   Sodium 135 - 145 mmol/L 140  139  140   Potassium 3.5 - 5.1 mmol/L 4.7  5.6  5.6   Chloride 98 - 111 mmol/L 103  106  106   CO2 22 - 32 mmol/L 24  28  29    Calcium  8.9 - 10.3 mg/dL 9.6  89.8  9.7  Total Protein 6.5 - 8.1 g/dL 7.4  7.3  7.0    Total Bilirubin 0.0 - 1.2 mg/dL 0.3  0.3  0.3   Alkaline Phos 38 - 126 U/L 124  98  100   AST 15 - 41 U/L 34  13  11   ALT 0 - 44 U/L 24  7  7        RADIOGRAPHIC STUDIES: I have personally reviewed the radiological images as listed and agreed with the findings in the report. No results found.    Orders Placed This Encounter  Procedures   CBC with Differential (Cancer Center Only)    Standing Status:   Future    Expected Date:   02/29/2024    Expiration Date:   02/28/2025   CMP (Cancer Center only)    Standing Status:   Future    Expected Date:   02/29/2024    Expiration Date:   02/28/2025   T4    Standing Status:   Future    Expected Date:   02/29/2024    Expiration Date:   02/28/2025   TSH    Standing Status:   Future    Expected Date:   02/29/2024    Expiration Date:   02/28/2025   CBC with Differential (Cancer Center Only)    Standing Status:   Future    Expected Date:   03/21/2024    Expiration Date:   03/21/2025   CMP (Cancer Center only)    Standing Status:   Future    Expected Date:   03/21/2024    Expiration Date:   03/21/2025   All questions were answered. The patient knows to call the clinic with any problems, questions or concerns. No barriers to learning was detected. The total time spent in the appointment was 25 minutes, including review of chart and various tests results, discussions about plan of care and coordination of care plan     Onita Mattock, MD 12/26/2023

## 2023-12-27 ENCOUNTER — Ambulatory Visit (HOSPITAL_COMMUNITY)
Admission: RE | Admit: 2023-12-27 | Discharge: 2023-12-27 | Disposition: A | Discharge status: Home / Self Care | Source: Ambulatory Visit | Attending: Hematology | Admitting: Hematology

## 2023-12-27 DIAGNOSIS — C7951 Secondary malignant neoplasm of bone: Secondary | ICD-10-CM | POA: Diagnosis not present

## 2023-12-27 DIAGNOSIS — C3490 Malignant neoplasm of unspecified part of unspecified bronchus or lung: Secondary | ICD-10-CM | POA: Diagnosis not present

## 2023-12-27 DIAGNOSIS — C3492 Malignant neoplasm of unspecified part of left bronchus or lung: Secondary | ICD-10-CM

## 2023-12-27 DIAGNOSIS — R600 Localized edema: Secondary | ICD-10-CM | POA: Diagnosis not present

## 2023-12-27 LAB — T4: T4, Total: 7.9 ug/dL (ref 4.5–12.0)

## 2024-01-05 ENCOUNTER — Other Ambulatory Visit: Payer: Self-pay

## 2024-01-05 ENCOUNTER — Inpatient Hospital Stay (HOSPITAL_COMMUNITY)
Admission: EM | Admit: 2024-01-05 | Discharge: 2024-01-11 | DRG: 603 | Disposition: A | Attending: Internal Medicine | Admitting: Internal Medicine

## 2024-01-05 ENCOUNTER — Encounter (HOSPITAL_COMMUNITY): Payer: Self-pay | Admitting: *Deleted

## 2024-01-05 DIAGNOSIS — L03116 Cellulitis of left lower limb: Principal | ICD-10-CM

## 2024-01-05 DIAGNOSIS — Z853 Personal history of malignant neoplasm of breast: Secondary | ICD-10-CM

## 2024-01-05 DIAGNOSIS — I10 Essential (primary) hypertension: Secondary | ICD-10-CM | POA: Diagnosis present

## 2024-01-05 DIAGNOSIS — Z85038 Personal history of other malignant neoplasm of large intestine: Secondary | ICD-10-CM | POA: Diagnosis present

## 2024-01-05 DIAGNOSIS — Z87891 Personal history of nicotine dependence: Secondary | ICD-10-CM

## 2024-01-05 DIAGNOSIS — C7951 Secondary malignant neoplasm of bone: Secondary | ICD-10-CM | POA: Diagnosis present

## 2024-01-05 DIAGNOSIS — L03119 Cellulitis of unspecified part of limb: Secondary | ICD-10-CM | POA: Diagnosis present

## 2024-01-05 DIAGNOSIS — C3490 Malignant neoplasm of unspecified part of unspecified bronchus or lung: Secondary | ICD-10-CM | POA: Diagnosis present

## 2024-01-05 DIAGNOSIS — E1169 Type 2 diabetes mellitus with other specified complication: Secondary | ICD-10-CM | POA: Diagnosis present

## 2024-01-05 DIAGNOSIS — E538 Deficiency of other specified B group vitamins: Secondary | ICD-10-CM | POA: Diagnosis present

## 2024-01-05 DIAGNOSIS — R7989 Other specified abnormal findings of blood chemistry: Secondary | ICD-10-CM

## 2024-01-05 DIAGNOSIS — Z86718 Personal history of other venous thrombosis and embolism: Secondary | ICD-10-CM

## 2024-01-05 DIAGNOSIS — N1832 Chronic kidney disease, stage 3b: Secondary | ICD-10-CM | POA: Diagnosis present

## 2024-01-05 LAB — GLUCOSE, CAPILLARY
Glucose-Capillary: 127 mg/dL — ABNORMAL HIGH (ref 70–99)
Glucose-Capillary: 141 mg/dL — ABNORMAL HIGH (ref 70–99)

## 2024-01-05 LAB — CBC WITH DIFFERENTIAL/PLATELET
Abs Immature Granulocytes: 0.09 K/uL — ABNORMAL HIGH (ref 0.00–0.07)
Basophils Absolute: 0 K/uL (ref 0.0–0.1)
Basophils Relative: 0 %
Eosinophils Absolute: 0 K/uL (ref 0.0–0.5)
Eosinophils Relative: 0 %
HCT: 38.6 % (ref 36.0–46.0)
Hemoglobin: 12.5 g/dL (ref 12.0–15.0)
Immature Granulocytes: 1 %
Lymphocytes Relative: 7 %
Lymphs Abs: 0.7 K/uL (ref 0.7–4.0)
MCH: 31.3 pg (ref 26.0–34.0)
MCHC: 32.4 g/dL (ref 30.0–36.0)
MCV: 96.7 fL (ref 80.0–100.0)
Monocytes Absolute: 2 K/uL — ABNORMAL HIGH (ref 0.1–1.0)
Monocytes Relative: 23 %
Neutro Abs: 6.2 K/uL (ref 1.7–7.7)
Neutrophils Relative %: 69 %
Platelets: 167 K/uL (ref 150–400)
RBC: 3.99 MIL/uL (ref 3.87–5.11)
RDW: 15 % (ref 11.5–15.5)
Smear Review: NORMAL
WBC: 9 K/uL (ref 4.0–10.5)
nRBC: 0.9 % — ABNORMAL HIGH (ref 0.0–0.2)

## 2024-01-05 LAB — COMPREHENSIVE METABOLIC PANEL WITH GFR
ALT: 27 U/L (ref 0–44)
AST: 60 U/L — ABNORMAL HIGH (ref 15–41)
Albumin: 3.3 g/dL — ABNORMAL LOW (ref 3.5–5.0)
Alkaline Phosphatase: 141 U/L — ABNORMAL HIGH (ref 38–126)
Anion gap: 11 (ref 5–15)
BUN: 36 mg/dL — ABNORMAL HIGH (ref 8–23)
CO2: 23 mmol/L (ref 22–32)
Calcium: 9.7 mg/dL (ref 8.9–10.3)
Chloride: 101 mmol/L (ref 98–111)
Creatinine, Ser: 1.71 mg/dL — ABNORMAL HIGH (ref 0.44–1.00)
GFR, Estimated: 29 mL/min — ABNORMAL LOW (ref >60)
Glucose, Bld: 142 mg/dL — ABNORMAL HIGH (ref 70–99)
Potassium: 5.1 mmol/L (ref 3.5–5.1)
Sodium: 135 mmol/L (ref 135–145)
Total Bilirubin: 0.6 mg/dL (ref 0.0–1.2)
Total Protein: 7.4 g/dL (ref 6.5–8.1)

## 2024-01-05 LAB — RESP PANEL BY RT-PCR (RSV, FLU A&B, COVID)  RVPGX2
Influenza A by PCR: NEGATIVE
Influenza B by PCR: NEGATIVE
Resp Syncytial Virus by PCR: NEGATIVE
SARS Coronavirus 2 by RT PCR: NEGATIVE

## 2024-01-05 LAB — TSH: TSH: 1.36 u[IU]/mL (ref 0.350–4.500)

## 2024-01-05 LAB — D-DIMER, QUANTITATIVE: D-Dimer, Quant: 3.7 ug{FEU}/mL — ABNORMAL HIGH (ref 0.00–0.50)

## 2024-01-05 LAB — VITAMIN B12: Vitamin B-12: 1107 pg/mL — ABNORMAL HIGH (ref 180–914)

## 2024-01-05 LAB — I-STAT CG4 LACTIC ACID, ED: Lactic Acid, Venous: 2 mmol/L (ref 0.5–1.9)

## 2024-01-05 MED ORDER — ENOXAPARIN SODIUM 30 MG/0.3ML IJ SOSY
30.0000 mg | PREFILLED_SYRINGE | INTRAMUSCULAR | Status: DC
  Administered 2024-01-05 – 2024-01-06 (×2): 30 mg via SUBCUTANEOUS
  Filled 2024-01-05 (×2): qty 0.3

## 2024-01-05 MED ORDER — FOLIC ACID 1 MG PO TABS
1.0000 mg | ORAL_TABLET | Freq: Every day | ORAL | Status: DC
  Administered 2024-01-06 – 2024-01-11 (×6): 1 mg via ORAL
  Filled 2024-01-05 (×7): qty 1

## 2024-01-05 MED ORDER — ACETAMINOPHEN 650 MG RE SUPP: 650.0000 mg | Freq: Four times a day (QID) | RECTAL | Status: DC | PRN

## 2024-01-05 MED ORDER — ONDANSETRON HCL 4 MG PO TABS: 4.0000 mg | ORAL_TABLET | Freq: Four times a day (QID) | ORAL | Status: DC | PRN

## 2024-01-05 MED ORDER — ACETAMINOPHEN 500 MG PO TABS
1000.0000 mg | ORAL_TABLET | Freq: Once | ORAL | Status: AC
Start: 2024-01-05 — End: 2024-01-05
  Administered 2024-01-05: 1000 mg via ORAL
  Filled 2024-01-05: qty 2

## 2024-01-05 MED ORDER — ACETAMINOPHEN 325 MG PO TABS: 650.0000 mg | ORAL_TABLET | Freq: Four times a day (QID) | ORAL | Status: DC | PRN

## 2024-01-05 MED ORDER — ONDANSETRON HCL 4 MG/2ML IJ SOLN: 4.0000 mg | Freq: Four times a day (QID) | INTRAMUSCULAR | Status: DC | PRN

## 2024-01-05 MED ORDER — LISINOPRIL 20 MG PO TABS
20.0000 mg | ORAL_TABLET | Freq: Every day | ORAL | Status: DC
  Administered 2024-01-06 – 2024-01-11 (×6): 20 mg via ORAL
  Filled 2024-01-05 (×2): qty 2
  Filled 2024-01-05 (×3): qty 1
  Filled 2024-01-05: qty 2
  Filled 2024-01-05: qty 1

## 2024-01-05 MED ORDER — PRAVASTATIN SODIUM 10 MG PO TABS
20.0000 mg | ORAL_TABLET | Freq: Every evening | ORAL | Status: DC
  Administered 2024-01-05 – 2024-01-11 (×7): 20 mg via ORAL
  Filled 2024-01-05: qty 2
  Filled 2024-01-05 (×2): qty 1
  Filled 2024-01-05 (×2): qty 2
  Filled 2024-01-05: qty 1
  Filled 2024-01-05: qty 2

## 2024-01-05 MED ORDER — OXYCODONE HCL 5 MG PO TABS: 5.0000 mg | ORAL_TABLET | Freq: Four times a day (QID) | ORAL | Status: DC | PRN

## 2024-01-05 MED ORDER — ASPIRIN 81 MG PO TBEC
81.0000 mg | DELAYED_RELEASE_TABLET | Freq: Every day | ORAL | Status: DC
  Administered 2024-01-05 – 2024-01-11 (×7): 81 mg via ORAL
  Filled 2024-01-05 (×9): qty 1

## 2024-01-05 MED ORDER — LACTATED RINGERS IV BOLUS
500.0000 mL | Freq: Once | INTRAVENOUS | Status: AC
  Administered 2024-01-05: 500 mL via INTRAVENOUS

## 2024-01-05 MED ORDER — VANCOMYCIN HCL IN DEXTROSE 1-5 GM/200ML-% IV SOLN
1000.0000 mg | INTRAVENOUS | Status: DC
  Administered 2024-01-06: 1000 mg via INTRAVENOUS
  Filled 2024-01-05: qty 200

## 2024-01-05 MED ORDER — LACTATED RINGERS IV SOLN: INTRAVENOUS | Status: AC

## 2024-01-05 MED ORDER — INSULIN ASPART 100 UNIT/ML IJ SOLN
0.0000 [IU] | Freq: Three times a day (TID) | INTRAMUSCULAR | Status: DC
  Administered 2024-01-05 – 2024-01-07 (×5): 1 [IU] via SUBCUTANEOUS
  Filled 2024-01-05 (×5): qty 1

## 2024-01-05 MED ORDER — VANCOMYCIN HCL IN DEXTROSE 1-5 GM/200ML-% IV SOLN: 1000.0000 mg | Freq: Once | INTRAVENOUS | Status: DC

## 2024-01-05 MED ORDER — SODIUM CHLORIDE 0.9 % IV SOLN
1.0000 g | INTRAVENOUS | Status: DC
  Administered 2024-01-06 – 2024-01-11 (×6): 1 g via INTRAVENOUS
  Filled 2024-01-05 (×7): qty 10

## 2024-01-05 MED ORDER — METOPROLOL TARTRATE 5 MG/5ML IV SOLN: 5.0000 mg | INTRAVENOUS | Status: DC | PRN

## 2024-01-05 MED ORDER — POLYETHYLENE GLYCOL 3350 17 G PO PACK: 17.0000 g | PACK | Freq: Every day | ORAL | Status: DC | PRN

## 2024-01-05 MED ORDER — OXYCODONE HCL 5 MG PO TABS
5.0000 mg | ORAL_TABLET | ORAL | Status: DC | PRN
  Administered 2024-01-06: 5 mg via ORAL
  Filled 2024-01-05: qty 1

## 2024-01-05 MED ORDER — CEFTRIAXONE SODIUM 2 G IJ SOLR
2.0000 g | Freq: Once | INTRAMUSCULAR | Status: AC
  Administered 2024-01-05: 2 g via INTRAVENOUS
  Filled 2024-01-05: qty 20

## 2024-01-05 MED ORDER — VANCOMYCIN HCL 1500 MG/300ML IV SOLN
1500.0000 mg | Freq: Once | INTRAVENOUS | Status: AC
  Administered 2024-01-05: 1500 mg via INTRAVENOUS
  Filled 2024-01-05: qty 300

## 2024-01-05 NOTE — H&P (Signed)
 History and Physical    Patient: Briana Ochoa FMW:[id number] DOB: 23-Jun-1940 DOA: 01/05/2024 DOS: the patient was seen and examined on 01/05/2024 PCP: [name], NP  Patient coming from: Home  Chief Complaint:  Chief Complaint  Patient presents with   Leg Pain   HPI: Briana Ochoa is a 83 y.o. female with medical history significant of multiple primary cancers to include melanoma, breast cancer, bladder cancer, colon cancer, now undergoing treatment for non-small cell lung cancer who has left lower extremity presenting cellulitis.  This has been treated by Doxy x 2 both times has gotten somewhat better but then recurs.  The patient reports several day history of increasing swelling edema, erythema, the patient has previously had 2 negative DVT studies in the past.  In the ED she was noted to have no significant white count, no fever, some elevation of her AST and alk phos as well as a acute on chronic kidney injury.  We were asked to admit for failed outpatient therapy.  Review of Systems: As mentioned in the history of present illness. All other systems reviewed and are negative. Past Medical History:  Diagnosis Date   Allergy    seasonal allergies   Anemia    on meds   Aortic atherosclerosis    Bladder cancer (HCC) 2020   Bladder tumor    Blood transfusion without reported diagnosis    Bloody diarrhea 09/29/2017   Breast cancer (HCC) 2015   Left Breast Cancer   Cataract    bilateral -sx   CKD (chronic kidney disease), stage III (HCC)    DM related   Colon cancer (HCC)    Diabetes mellitus type 2, diet-controlled (HCC)    diet controlled   Dyspnea 09/28/2017   Dysuria    Family history of adverse reaction to anesthesia    aunt- N/V    Family history of breast cancer    Family history of kidney cancer    Family history of throat cancer    Family history of uterine cancer    Fatigue 09/28/2017   Frequency of urination    Grade I diastolic dysfunction  08/06/2014   Noted on ECHO   Gross hematuria 09/29/2017   Heart murmur    since rheumatic fever as child   Hematuria    History of cardiomegaly 02/12/2005   Mild, noted on CXR   History of CVA (cerebrovascular accident) 08/05/2014   post op cerebral angiogram, cerebral thrombosis w/ cerebral infarction;  11-02-2017  per pt no residuals   History of malignant melanoma of skin 12/2015   s/p wide local excision right lower leg (per pt localized)   History of rheumatic fever as a child    Hyperlipidemia    on meds   Hypertension    on meds   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   Sigmoid  diverticulosis    Stroke (HCC)    Weakness 09/28/2017   Wears dentures    upper   Wears glasses    Wears hearing aid in both ears    Past Surgical History:  Procedure Laterality Date   BREAST LUMPECTOMY Left 2015   BREAST LUMPECTOMY WITH NEEDLE LOCALIZATION Left 09/19/2013   Procedure: LEFT BREAST LUMPECTOMY WITH DOUBLE WIRE BRACKETED  NEEDLE LOCALIZATION;  Surgeon: Elon CHRISTELLA Pacini, MD;  Location: [facility] OR;  Service: General;  Laterality: Left;   BRONCHIAL BIOPSY  04/06/2021   Procedure: BRONCHIAL BIOPSIES;  Surgeon: Brenna Adine CROME, DO;  Location: [facility]OS[facility];  Service: Pulmonary;;   BRONCHIAL BRUSHINGS  04/06/2021   Procedure: BRONCHIAL BRUSHINGS;  Surgeon: Brenna Adine CROME, DO;  Location: [facility];  Service: Pulmonary;;    CATARACT EXTRACTION W/ INTRAOCULAR LENS  IMPLANT, BILATERAL  06/2015   CESAREAN SECTION  1964   CHOLECYSTECTOMY OPEN  1970s   CYSTOSCOPY W/ URETERAL STENT PLACEMENT Left 11/07/2017   Procedure: CYSTOSCOPY WITH RETROGRADE PYELOGRAM/URETERAL STENT PLACEMENT;  Surgeon: Sherrilee Belvie CROME, MD;  Location: [facility];  Service: Urology;  Laterality: Left;   CYSTOSCOPY W/ URETERAL STENT PLACEMENT Bilateral 04/27/2018   Procedure: CYSTOSCOPY WITH RETROGRADE PYELOGRAM;  Surgeon: Sherrilee Belvie CROME, MD;  Location: [facility];  Service: Urology;  Laterality: Bilateral;   FINGER SURGERY     lt thumb  from dog bite   IR  NEPHROURETERAL CATH PLACE LEFT  04/28/2018   MELANOMA EXCISION Right 12/28/2015   Procedure: EXCISION MELANOMA RIGHT LOWER LEG;  Surgeon: Elon Pacini, MD;  Location: [facility];  Service: General;  Laterality: Right;   RADIOLOGY WITH ANESTHESIA N/A 01/09/2014   Procedure: Embolization;  Surgeon: Gerldine Maizes, MD;  Location: [facility] OR;  Service: Radiology;  Laterality: N/A;   SPLENECTOMY, TOTAL  2009 approx.   splen injury due to fall off horse   TONSILLECTOMY  child   TRANSURETHRAL RESECTION OF BLADDER TUMOR N/A 11/07/2017   Procedure: TRANSURETHRAL RESECTION OF BLADDER TUMOR (TURBT), POSSIBLE STENT PLACEMENT;  Surgeon: Sherrilee Belvie CROME, MD;  Location: [facility];  Service: Urology;  Laterality: N/A;   TRANSURETHRAL RESECTION OF BLADDER TUMOR N/A 12/14/2017   Procedure: TRANSURETHRAL RESECTION OF BLADDER TUMOR (TURBT);  Surgeon: Sherrilee Belvie CROME, MD;  Location: [facility];  Service: Urology;  Laterality: N/A;  30 MINS   TRANSURETHRAL RESECTION OF BLADDER TUMOR N/A 04/27/2018   Procedure: TRANSURETHRAL RESECTION OF BLADDER TUMOR (TURBT);  Surgeon: Sherrilee Belvie CROME, MD;  Location: [facility];  Service: Urology;  Laterality: N/A;  30 MINS   VIDEO BRONCHOSCOPY WITH ENDOBRONCHIAL NAVIGATION  Bilateral 10/02/2023   Procedure: VIDEO BRONCHOSCOPY WITH ENDOBRONCHIAL NAVIGATION;  Surgeon: Shelah Lamar RAMAN, MD;  Location: [facility] ENDOSCOPY;  Service: Pulmonary;  Laterality: Bilateral;   VIDEO BRONCHOSCOPY WITH RADIAL ENDOBRONCHIAL ULTRASOUND  04/06/2021   Procedure: RADIAL ENDOBRONCHIAL ULTRASOUND;  Surgeon: Brenna Adine CROME, DO;  Location: [facility];  Service: Pulmonary;;   Social History:  reports that she quit smoking about 16 years ago. Her smoking use included cigarettes. She started smoking about 56 years ago. She has a 60 pack-year smoking history. She has been exposed to tobacco smoke. She has never used smokeless tobacco. She reports that she does not drink alcohol and does not use drugs.  Allergies  Allergen Reactions   Feraheme  [Ferumoxytol ] Shortness Of Breath and Palpitations    Flushed face; palpitations, SOB    Family History  Problem Relation Age  of Onset   Hypertension Mother    Kidney cancer Mother        dx early 38s   Heart attack Paternal Grandfather    Throat cancer Brother 57       hx smoking   Head & neck cancer Son 35       jaw cancer   Breast cancer Maternal Aunt 66   Breast cancer Paternal Aunt 70   Uterine cancer Cousin        dx early 47s (maternal first cousin)   Colon polyps Neg Hx    Colon cancer Neg Hx    Esophageal cancer Neg Hx    Rectal cancer Neg Hx    Stomach cancer Neg Hx     Prior to Admission medications   Medication Sig Start Date End Date Taking? Authorizing Provider  acetaminophen  (TYLENOL ) 500 MG tablet Take 1,000 mg by mouth daily.   Yes [provider]  aspirin  EC 81 MG tablet Take 81 mg by mouth daily. Swallow whole.   Yes [provider]  Cyanocobalamin  (VITAMIN B 12 PO) Take 1 tablet by mouth 3 (three) times a week. Take 1 tablet by mouth 3 times a week. Take on Monday, Wednesday and Friday Patient not taking: Reported on 01/05/2024    [provider]  folic acid  (FOLVITE ) 1 MG tablet Take 1 tablet  (1 mg total) by mouth daily. Start 7 days before pemetrexed  chemotherapy. Continue until 21 days after pemetrexed  completed. 11/20/23   Lanny Callander, MD  glucose blood (TRUE METRIX BLOOD GLUCOSE TEST) test strip Use as instructed to test blood sugar once daily E08.638 12/02/15   Aron, Talia M, MD  lisinopril  (ZESTRIL ) 20 MG tablet Take 1 tablet (20 mg total) by mouth daily. 07/11/23   Nche, Roselie Rockford, NP  mupirocin  ointment (BACTROBAN ) 2 % Apply 1 Application topically 2 (two) times daily. Apply with dressing change 12/06/23   Nche, Roselie Rockford, NP  Na Sulfate-K Sulfate-Mg Sulfate concentrate (SUPREP) 17.5-3.13-1.6 GM/177ML SOLN  11/06/23   [provider]  ondansetron  (ZOFRAN ) 8 MG tablet Take 1 tablet (8 mg total) by mouth every 8 (eight) hours as needed for nausea or vomiting. Start on the third day after carboplatin. 11/20/23   Lanny Callander, MD  pravastatin  (PRAVACHOL ) 20 MG tablet Take 1 tablet (20 mg total) by mouth every evening. 10/23/23   Nche, Roselie Rockford, NP  prochlorperazine  (COMPAZINE ) 10 MG tablet Take 1 tablet (10 mg total) by mouth every 6 (six) hours as needed for nausea or vomiting. 11/20/23   Lanny Callander, MD    Physical Exam: Vitals:   01/05/24 1345 01/05/24 1400 01/05/24 1415 01/05/24 1530  BP: 131/77 134/70 127/73 (!) 132/58  Pulse: 90 88 89 81  Resp:   18 18  Temp:      TempSrc:      SpO2: 94% 97% 96% 95%  Weight:       Physical Examination: General appearance - alert, well appearing, and in no distress Chest -normal effort Heart -regular rate and rhythm Abdomen - soft, nontender, nondistended, no masses or organomegaly Skin -see photo     Data Reviewed: Results for orders placed or performed during the hospital encounter of 01/05/24 (from the past 24 hours)  Comprehensive metabolic panel     Status: Abnormal   Collection Time: 01/05/24  3:03 PM  Result Value Ref Range   Sodium 135 135 - 145 mmol/L   Potassium 5.1 3.5 - 5.1 mmol/L   Chloride  101 98 -  111 mmol/L   CO2 23 22 - 32 mmol/L   Glucose, Bld 142 (H) 70 - 99 mg/dL   BUN 36 (H) 8 - 23 mg/dL   Creatinine, Ser 8.28 (H) 0.44 - 1.00 mg/dL   Calcium  9.7 8.9 - 10.3 mg/dL   Total Protein 7.4 6.5 - 8.1 g/dL   Albumin  3.3 (L) 3.5 - 5.0 g/dL   AST 60 (H) 15 - 41 U/L   ALT 27 0 - 44 U/L   Alkaline Phosphatase 141 (H) 38 - 126 U/L   Total Bilirubin 0.6 0.0 - 1.2 mg/dL   GFR, Estimated 29 (L) >60 mL/min   Anion gap 11 5 - 15  CBC with Differential     Status: Abnormal   Collection Time: 01/05/24  3:03 PM  Result Value Ref Range   WBC 9.0 4.0 - 10.5 K/uL   RBC 3.99 3.87 - 5.11 MIL/uL   Hemoglobin 12.5 12.0 - 15.0 g/dL   HCT 61.3 63.9 - 53.9 %   MCV 96.7 80.0 - 100.0 fL   MCH 31.3 26.0 - 34.0 pg   MCHC 32.4 30.0 - 36.0 g/dL   RDW 84.9 88.4 - 84.4 %   Platelets 167 150 - 400 K/uL   nRBC 0.9 (H) 0.0 - 0.2 %   Neutrophils Relative % 69 %   Neutro Abs 6.2 1.7 - 7.7 K/uL   Lymphocytes Relative 7 %   Lymphs Abs 0.7 0.7 - 4.0 K/uL   Monocytes Relative 23 %   Monocytes Absolute 2.0 (H) 0.1 - 1.0 K/uL   Eosinophils Relative 0 %   Eosinophils Absolute 0.0 0.0 - 0.5 K/uL   Basophils Relative 0 %   Basophils Absolute 0.0 0.0 - 0.1 K/uL   WBC Morphology MORPHOLOGY UNREMARKABLE    RBC Morphology MORPHOLOGY UNREMARKABLE    Smear Review Normal platelet morphology    Immature Granulocytes 1 %   Abs Immature Granulocytes 0.09 (H) 0.00 - 0.07 K/uL  Blood culture (routine x 2)     Status: None (Preliminary result)   Collection Time: 01/05/24  3:03 PM   Specimen: BLOOD LEFT ARM  Result Value Ref Range   Specimen Description      BLOOD LEFT ARM Performed at [facility], [address], [place], [place] [address]    Special Requests      BOTTLES DRAWN AEROBIC AND ANAEROBIC Blood Culture results may not be optimal due to an inadequate volume of blood received in culture bottles Performed at [facility], [address] [place], [place] [address]    Culture PENDING     Report Status PENDING   Resp panel by RT-PCR (RSV, Flu A&B, Covid) Anterior Nasal Swab     Status: None   Collection Time: 01/05/24  3:14 PM   Specimen: Anterior Nasal Swab  Result Value Ref Range   SARS Coronavirus 2 by RT PCR NEGATIVE NEGATIVE   Influenza A by PCR NEGATIVE NEGATIVE   Influenza B by PCR NEGATIVE NEGATIVE   Resp Syncytial Virus by PCR NEGATIVE NEGATIVE  I-Stat Lactic Acid, ED     Status: Abnormal   Collection Time: 01/05/24  3:15 PM  Result Value Ref Range   Lactic Acid, Venous 2.0 (HH) 0.5 - 1.9 mmol/L  TSH     Status: None   Collection Time: 01/05/24  5:10 PM  Result Value Ref Range   TSH 1.360 0.350 - 4.500 uIU/mL  Vitamin B12  Status: Abnormal   Collection Time: 01/05/24  5:10 PM  Result Value Ref Range   Vitamin B-12 1,107 (H) 180 - 914 pg/mL  D-dimer, quantitative     Status: Abnormal   Collection Time: 01/05/24  5:10 PM  Result Value Ref Range   D-Dimer, Quant 3.70 (H) 0.00 - 0.50 ug/mL-FEU  Glucose, capillary     Status: Abnormal   Collection Time: 01/05/24  5:16 PM  Result Value Ref Range   Glucose-Capillary 127 (H) 70 - 99 mg/dL  Blood culture (routine x 2)     Status: None (Preliminary result)   Collection Time: 01/05/24  5:19 PM   Specimen: BLOOD RIGHT HAND  Result Value Ref Range   Specimen Description      BLOOD RIGHT HAND Performed at [facility], [address] [place], [place] [address]    Special Requests      BOTTLES DRAWN AEROBIC ONLY Blood Culture adequate volume Performed at [facility], [address] [place], [place] [address]    Culture PENDING    Report Status PENDING    VAS US  LOWER EXTREMITY VENOUS (DVT) Result Date: 12/27/2023  Lower Venous DVT Study Patient Name:  MILISSA FESPERMAN  Date of Exam:   12/27/2023 Medical Rec #: [id number]            Accession #:    [id number] Date of Birth: 08/24/1940             Patient Gender: F Patient Age:   67 years Exam Location:  [address]  Procedure:      VAS US  LOWER EXTREMITY VENOUS (DVT) Referring Phys: ONITA MATTOCK --------------------------------------------------------------------------------  Indications: Swelling, and Pain.  Risk Factors: Cancer breast, bladder, left colon DVT Right gastrocnemius veins chronic venous insufficiency. Comparison Study: 11/28/2023: LLEV negative for DVT. Performing Technologist: Stoney Ross RVT  Examination Guidelines: A complete evaluation includes B-mode imaging, spectral Doppler, color Doppler, and power Doppler as needed of all accessible portions of each vessel. Bilateral testing is considered an integral part of a complete examination. Limited examinations for reoccurring indications may be performed as noted. The reflux portion of the exam is performed with the patient in reverse Trendelenburg.  +-----+---------------+---------+-----------+----------+--------------+ RIGHTCompressibilityPhasicitySpontaneityPropertiesThrombus Aging +-----+---------------+---------+-----------+----------+--------------+ CFV  Full           Yes      Yes                                 +-----+---------------+---------+-----------+----------+--------------+ SFJ  Full           Yes      Yes                                 +-----+---------------+---------+-----------+----------+--------------+   +---------+---------------+---------+-----------+----------+--------------+ LEFT     CompressibilityPhasicitySpontaneityPropertiesThrombus Aging +---------+---------------+---------+-----------+----------+--------------+ CFV      Full           Yes      Yes                                 +---------+---------------+---------+-----------+----------+--------------+ SFJ      Full           Yes      Yes                                 +---------+---------------+---------+-----------+----------+--------------+  FV Prox  Full                    Yes                                  +---------+---------------+---------+-----------+----------+--------------+ FV Mid   Full           Yes      Yes                                 +---------+---------------+---------+-----------+----------+--------------+ FV DistalFull           Yes      Yes                                 +---------+---------------+---------+-----------+----------+--------------+ PFV      Full                    Yes                                 +---------+---------------+---------+-----------+----------+--------------+ POP      Full           Yes      Yes                                 +---------+---------------+---------+-----------+----------+--------------+ PTV      Full                    Yes                                 +---------+---------------+---------+-----------+----------+--------------+ PERO     Full                    Yes                                 +---------+---------------+---------+-----------+----------+--------------+ GSV      Full                    Yes                                 +---------+---------------+---------+-----------+----------+--------------+  Summary: RIGHT: - No evidence of common femoral vein obstruction.  LEFT: - There is no evidence of deep vein thrombosis in the lower extremity. - There is no evidence of superficial venous thrombosis.  - No cystic structure found in the popliteal fossa.  *See table(s) above for measurements and observations. Electronically signed by Lonni Gaskins MD on 12/27/2023 at 4:30:30 PM.    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  Stage 3b chronic kidney disease (HCC) Baseline creatinine is about 1.4, it is up to 1.7 today Gentle IV fluids Avoid nephrotoxic agents Trend  HTN (hypertension) Continue lisinopril       Advance Care Planning:   Code Status: Prior full confirmed with patient  Consults: None  Family Communication: Daughter at bedside  Severity of Illness: The appropriate patient status for this patient is OBSERVATION. Observation status is judged to be reasonable and necessary in order to provide the required intensity of service to ensure the patient's safety. The patient's presenting symptoms, physical exam findings, and initial radiographic and laboratory data in the context of their medical condition is felt to place them at decreased risk for further clinical deterioration. Furthermore, it is anticipated that the patient will be medically stable for discharge from the hospital within 2 midnights of admission.   Author: Glenys GORMAN Birk, MD 01/05/2024 4:02 PM  For on call review [email or web address].

## 2024-01-05 NOTE — Assessment & Plan Note (Addendum)
 Baseline creatinine ~1.4. Cr trend:  1.46 >> 1.71 (given gentle IV fluids in hospital) >> 1.49 >> 1.28 >> 1.52 >> 1.48 today. Encourage PO hydration Additional IV fluids as needed Monitor BMP Avoid nephrotoxic agents & renally dose meds Recheck BMP today

## 2024-01-05 NOTE — Hospital Course (Signed)
 Patient is a 83 year old with history of multiple primary cancers to include melanoma, breast cancer, bladder cancer, colon cancer, now undergoing treatment for non-small cell lung cancer who has left lower extremity presenting cellulitis.  This has been treated by Doxy x 2 both times has gotten somewhat better but then recurs.  The patient reports several day history of increasing swelling edema, erythema, the patient has previously had 2 negative DVT studies in the past.  In the ED she was noted to have no significant white count, no fever, some elevation of her AST and alk phos as well as a acute on chronic kidney injury.  We were asked to admit for failed outpatient therapy.

## 2024-01-05 NOTE — Assessment & Plan Note (Signed)
 No current evidence of recurrence

## 2024-01-05 NOTE — Progress Notes (Signed)
 Pharmacy Note   A consult was received from an ED physician for vancomycin per pharmacy dosing.    The patient's profile has been reviewed for ht/wt/allergies/indication/available labs.    A one time order has been placed for vancomycin 1500 mg IV x1 .    Further antibiotics/pharmacy consults should be ordered by admitting physician if indicated.                       Thank you,  Dolphus Roller, PharmD, BCPS 01/05/2024 3:53 PM

## 2024-01-05 NOTE — Assessment & Plan Note (Signed)
 Continue pravastatin 

## 2024-01-05 NOTE — Assessment & Plan Note (Addendum)
 Presented with erythema, edema, pain of the left lower extremity.  Treated with 2 courses of doxycycline  as outpatient. Started on IV ceftriaxone  and vancomycin on admission. Vancomycin >> Zyvox on 11/30. Leukocytosis improving. DVT ruled out on 11/30 12/2 - persistent pain, tenderness, erythema improved since admission but with some extension reported up the posterior leg to above knee Day 5 antibiotics, Continue IV Rocephin  and PO Zyvox, Follow blood cultures - neg x 4 days  12/3: day 6 of antibiotics. Discussed diet to encourage healthy skin, colorful vegetables, fresh fruit, baked protein such as salmon, chicken breast. Discussed avoiding tobacco use, alcohol, and decreasing-eliminating sugar and/or processed food from her diet. Discussed moisturizing her skin after drying it after a shower. Encourage keeping skin moisturized twice daily with basic skin lotion. Patient/daughter at patient's side, states pt has eucerin lotion.  This is patient's third episode of cellulitis and pt and daughter concerned that it is recurring after chemo therapy treatment. She has next chemo tx on Monday, 12/8. At this time, I will plan for 12-14 course total of abx treatment on discharge. Plan for discharge tomorrow after receiving day 7 of IV abx.   12/4: day 7 of abx. Plan will be to discharge with cephalexin  500 mg BID PO starting 12/5 for 7 days to complete a 14 day course as this is patient's 3rd episode of cellulitis since October 2025.

## 2024-01-05 NOTE — Progress Notes (Signed)
 Pharmacy Antibiotic Note  Briana Ochoa is a 83 y.o. female admitted on 01/05/2024 with sepsis.  Pharmacy has been consulted for vancomycin dosing.  Plan: Vancomycin 1500 mg IV x1 given in ED, them vancomycin 1000 mg IV q48h ( AUC 515, Scr 1.71 mg/dl, wt 12.8 kg, Vd 0.5) Ceftriaxone  1 gr IV Daily   Weight: 87.1 kg (192 lb)  Temp (24hrs), Avg:98.3 F (36.8 C), Min:98.2 F (36.8 C), Max:98.3 F (36.8 C)  Recent Labs  Lab 01/05/24 1503 01/05/24 1515  WBC 9.0  --   CREATININE 1.71*  --   LATICACIDVEN  --  2.0*    Estimated Creatinine Clearance: 26.6 mL/min (A) (by C-G formula based on SCr of 1.71 mg/dL (H)).    Allergies  Allergen Reactions   Feraheme  [Ferumoxytol ] Shortness Of Breath and Palpitations    Flushed face; palpitations, SOB    Antimicrobials this admission: 11/28  vancomycin >>  11/28 ceftriaxone   >>   Dose adjustments this admission:   Microbiology results: 11/28 BCx:  11/28 resp panel:      Dolphus Roller, PharmD, BCPS 01/05/2024 5:42 PM

## 2024-01-05 NOTE — Assessment & Plan Note (Addendum)
 Recent negative lower extremity Doppler  Check D-dimer which is elevated, therefore we will repeat Doppler studies

## 2024-01-05 NOTE — Assessment & Plan Note (Signed)
 Patient quit smoking about 20 years ago.

## 2024-01-05 NOTE — Assessment & Plan Note (Signed)
 Currently on chemotherapy Management per oncology, follow up as scheduled

## 2024-01-05 NOTE — ED Provider Notes (Signed)
 [facility] AT [facility] Provider Note   CSN: [id number] Arrival date & time: 01/05/24  1304     Patient presents with: Leg Pain   Briana Ochoa is a 83 y.o. female.  Patient with past medical history of DVT not on anticoagulation, hypertension, hyperlipidemia, history of tobacco abuse, lung cancer with metastasis, chronic kidney disease scented to ER with complaint of left lower leg swelling redness and pain.  Patient reports that approximately 4 months ago she had an anterior shin wound that has about cleared up at this point.  She reports that 1 month ago she started dealing with cellulitis and was treated with doxycycline  with significant improvement however about 10 days ago cellulitis came back she was restarted on doxycycline  and continues to have significant symptoms.  She reports that she has not had any fever or chills at home but due to pain she is not able to ambulate well at home.  Reports some congestion over the past 7 days as well.  No chest pain shortness of breath cough.  Has had 2 negative DVT ultrasounds outpatient for this.    Leg Pain      Prior to Admission medications   Medication Sig Start Date End Date Taking? Authorizing Provider  acetaminophen  (TYLENOL ) 500 MG tablet Take 1,000 mg by mouth daily.   Yes [provider]  aspirin  EC 81 MG tablet Take 81 mg by mouth daily. Swallow whole.   Yes [provider]  folic acid  (FOLVITE ) 1 MG tablet Take 1 tablet (1 mg total) by mouth daily. Start 7 days before pemetrexed  chemotherapy. Continue until 21 days after pemetrexed  completed. 11/20/23  Yes Lanny Callander, MD  lisinopril  (ZESTRIL ) 20 MG tablet Take 1 tablet (20 mg total) by mouth daily. 07/11/23  Yes Nche, Roselie Rockford, NP  mupirocin  ointment (BACTROBAN ) 2 % Apply 1 Application topically 2 (two) times daily. Apply with dressing change 12/06/23  Yes Nche, Roselie Rockford, NP  ondansetron  (ZOFRAN ) 8 MG tablet Take 1  tablet (8 mg total) by mouth every 8 (eight) hours as needed for nausea or vomiting. Start on the third day after carboplatin. 11/20/23  Yes Lanny Callander, MD  pravastatin  (PRAVACHOL ) 20 MG tablet Take 1 tablet (20 mg total) by mouth every evening. 10/23/23  Yes Nche, Roselie Rockford, NP  prochlorperazine  (COMPAZINE ) 10 MG tablet Take 1 tablet (10 mg total) by mouth every 6 (six) hours as needed for nausea or vomiting. 11/20/23  Yes Lanny Callander, MD  glucose blood (TRUE METRIX BLOOD GLUCOSE TEST) test strip Use as instructed to test blood sugar once daily E08.638 12/02/15   Aron, Talia M, MD    Allergies: Feraheme  [ferumoxytol ]    Review of Systems  Skin:  Positive for wound.    Updated Vital Signs BP (!) 132/58   Pulse 81   Temp 98.3 F (36.8 C) (Oral)   Resp 18   Wt 87.1 kg   SpO2 95%   BMI 32.96 kg/m   Physical Exam Vitals and nursing note reviewed.  Constitutional:      General: She is not in acute distress.    Appearance: She is not toxic-appearing.  HENT:     Head: Normocephalic and atraumatic.  Eyes:     General: No scleral icterus.    Conjunctiva/sclera: Conjunctivae normal.  Cardiovascular:     Rate and Rhythm: Normal rate and regular rhythm.     Pulses: Normal pulses.     Heart sounds: Normal heart  sounds.  Pulmonary:     Effort: Pulmonary effort is normal. No respiratory distress.     Breath sounds: Normal breath sounds.  Abdominal:     General: Abdomen is flat. Bowel sounds are normal.     Palpations: Abdomen is soft.     Tenderness: There is no abdominal tenderness.  Musculoskeletal:     Right lower leg: Edema present.     Left lower leg: Edema present.     Comments: Slight erythema of right anterior shin.  Significant swelling pain warmth and erythema of left shin and left foot.  Neurovascularly intact distally.  Skin:    General: Skin is warm and dry.     Findings: No lesion.  Neurological:     General: No focal deficit present.     Mental Status: She is  alert and oriented to person, place, and time. Mental status is at baseline.     (all labs ordered are listed, but only abnormal results are displayed) Labs Reviewed  COMPREHENSIVE METABOLIC PANEL WITH GFR - Abnormal; Notable for the following components:      Result Value   Glucose, Bld 142 (*)    BUN 36 (*)    Creatinine, Ser 1.71 (*)    Albumin  3.3 (*)    AST 60 (*)    Alkaline Phosphatase 141 (*)    GFR, Estimated 29 (*)    All other components within normal limits  CBC WITH DIFFERENTIAL/PLATELET - Abnormal; Notable for the following components:   nRBC 0.9 (*)    Monocytes Absolute 2.0 (*)    Abs Immature Granulocytes 0.09 (*)    All other components within normal limits  I-STAT CG4 LACTIC ACID, ED - Abnormal; Notable for the following components:   Lactic Acid, Venous 2.0 (*)    All other components within normal limits  RESP PANEL BY RT-PCR (RSV, FLU A&B, COVID)  RVPGX2  CULTURE, BLOOD (ROUTINE X 2)  CULTURE, BLOOD (ROUTINE X 2)  URINALYSIS, ROUTINE W REFLEX MICROSCOPIC  I-STAT CG4 LACTIC ACID, ED    EKG: None  Radiology: No results found.   Procedures   Medications Ordered in the ED  oxyCODONE  (Oxy IR/ROXICODONE ) immediate release tablet 5 mg (has no administration in time range)  cefTRIAXone  (ROCEPHIN ) 2 g in sodium chloride  0.9 % 100 mL IVPB (2 g Intravenous New Bag/Given 01/05/24 1611)  vancomycin (VANCOREADY) IVPB 1500 mg/300 mL (has no administration in time range)  acetaminophen  (TYLENOL ) tablet 1,000 mg (1,000 mg Oral Given 01/05/24 1610)                                    Medical Decision Making Amount and/or Complexity of Data Reviewed Labs: ordered.  Risk OTC drugs. Prescription drug management. Decision regarding hospitalization.   This patient presents to the ED for concern of leg pain, this involves an extensive number of treatment options, and is a complaint that carries with it a high risk of complications and morbidity.  The  differential diagnosis includes deep space infection, cellulitis, abscess, DVT   Co morbidities that complicate the patient evaluation  Lung cancer   Additional history obtained:  Additional history obtained from DVT OP x2 negative for DVT, on doxycycline  x2, OV w/ Dr lanny 12/26/23   Lab Tests:  I personally interpreted labs.  The pertinent results include:   No leukocytosis, no anemia.  CMP.  At baseline.  Lactic is elevated at  2 Blood cultures x 2 pending    Cardiac Monitoring: / EKG:  The patient was maintained on a cardiac monitor.     Consultations Obtained:  I requested consultation with the hospital team,  and discussed lab and imaging findings as well as pertinent plan.   Problem List / ED Course / Critical interventions / Medication management  Patient reporting to emergency room with cellulitis of left lower extremity for approximately 1 month.  Has had improvement but not resolution of symptoms with outpatient oral antibiotics.  She has now completed 2 rounds of antibiotics.  On arrival hemodynamically stable well-appearing.  She has not had any fever or chills body aches.  The area is warm, painful and erythema consistent with cellulitis.  She is neurovascularly intact distally.  She has had 2 outpatient ultrasounds to rule out blood clots both of which have been negative.  She denies any chest pain shortness of breath.  She does admit to baseline cough as she has history of lung cancer.  Given outpatient failure and significance of symptoms that she should be brought into hospital for IV antibiotics and further care. I ordered medication including Vancomycin, Rocephin . Reevaluation of the patient after these medicines showed that the patient stayed the same I have reviewed the patients home medicines and have made adjustments as needed Admitted for cellulitis.         Final diagnoses:  Cellulitis of left lower leg    ED Discharge Orders     None           Shermon Warren SAILOR, PA-C 01/05/24 1615    Kammerer, Megan L, DO 01/06/24 [phone]

## 2024-01-05 NOTE — ED Triage Notes (Signed)
 Ca pt: BIB family from home for LLE swelling, redness, wound, and pain c/w cellulitis. Describes as ongoing, fluctuating and worsening over last month. Has been seen by PCP, and ONC for same. Has completed ABT x2, as well as topical. Initially improved, but returned. Referred to wound care, but can't be seen until mid December. Reports cannot weight bear. Alert, NAD, calm, interactive, resps e/u, speaking in clear complete sentences.Skin W&D. Sitting in w/c.

## 2024-01-05 NOTE — Assessment & Plan Note (Signed)
 Continue lisinopril 

## 2024-01-05 NOTE — Assessment & Plan Note (Signed)
 No evidence of recurrence

## 2024-01-05 NOTE — Assessment & Plan Note (Addendum)
 Check level Patient does get B12 injections

## 2024-01-06 DIAGNOSIS — R52 Pain, unspecified: Secondary | ICD-10-CM | POA: Diagnosis not present

## 2024-01-06 DIAGNOSIS — C7951 Secondary malignant neoplasm of bone: Secondary | ICD-10-CM | POA: Diagnosis present

## 2024-01-06 DIAGNOSIS — R7989 Other specified abnormal findings of blood chemistry: Secondary | ICD-10-CM | POA: Diagnosis present

## 2024-01-06 DIAGNOSIS — I129 Hypertensive chronic kidney disease with stage 1 through stage 4 chronic kidney disease, or unspecified chronic kidney disease: Secondary | ICD-10-CM | POA: Diagnosis present

## 2024-01-06 DIAGNOSIS — Z86718 Personal history of other venous thrombosis and embolism: Secondary | ICD-10-CM | POA: Diagnosis not present

## 2024-01-06 DIAGNOSIS — Z7982 Long term (current) use of aspirin: Secondary | ICD-10-CM | POA: Diagnosis not present

## 2024-01-06 DIAGNOSIS — E669 Obesity, unspecified: Secondary | ICD-10-CM | POA: Diagnosis present

## 2024-01-06 DIAGNOSIS — Z79899 Other long term (current) drug therapy: Secondary | ICD-10-CM | POA: Diagnosis not present

## 2024-01-06 DIAGNOSIS — L03116 Cellulitis of left lower limb: Secondary | ICD-10-CM

## 2024-01-06 DIAGNOSIS — Z853 Personal history of malignant neoplasm of breast: Secondary | ICD-10-CM | POA: Diagnosis not present

## 2024-01-06 DIAGNOSIS — N1832 Chronic kidney disease, stage 3b: Secondary | ICD-10-CM | POA: Diagnosis present

## 2024-01-06 DIAGNOSIS — C3490 Malignant neoplasm of unspecified part of unspecified bronchus or lung: Secondary | ICD-10-CM | POA: Diagnosis present

## 2024-01-06 DIAGNOSIS — E538 Deficiency of other specified B group vitamins: Secondary | ICD-10-CM | POA: Diagnosis present

## 2024-01-06 DIAGNOSIS — E1169 Type 2 diabetes mellitus with other specified complication: Secondary | ICD-10-CM | POA: Diagnosis present

## 2024-01-06 DIAGNOSIS — D84821 Immunodeficiency due to drugs: Secondary | ICD-10-CM | POA: Diagnosis present

## 2024-01-06 DIAGNOSIS — Z1152 Encounter for screening for COVID-19: Secondary | ICD-10-CM | POA: Diagnosis not present

## 2024-01-06 DIAGNOSIS — M79605 Pain in left leg: Secondary | ICD-10-CM | POA: Diagnosis present

## 2024-01-06 DIAGNOSIS — T451X5A Adverse effect of antineoplastic and immunosuppressive drugs, initial encounter: Secondary | ICD-10-CM | POA: Diagnosis present

## 2024-01-06 DIAGNOSIS — I10 Essential (primary) hypertension: Secondary | ICD-10-CM | POA: Diagnosis not present

## 2024-01-06 DIAGNOSIS — R197 Diarrhea, unspecified: Secondary | ICD-10-CM | POA: Diagnosis not present

## 2024-01-06 DIAGNOSIS — E1122 Type 2 diabetes mellitus with diabetic chronic kidney disease: Secondary | ICD-10-CM | POA: Diagnosis present

## 2024-01-06 DIAGNOSIS — R5381 Other malaise: Secondary | ICD-10-CM | POA: Diagnosis present

## 2024-01-06 DIAGNOSIS — H9190 Unspecified hearing loss, unspecified ear: Secondary | ICD-10-CM | POA: Diagnosis present

## 2024-01-06 DIAGNOSIS — E7849 Other hyperlipidemia: Secondary | ICD-10-CM | POA: Diagnosis present

## 2024-01-06 DIAGNOSIS — Z6832 Body mass index (BMI) 32.0-32.9, adult: Secondary | ICD-10-CM | POA: Diagnosis not present

## 2024-01-06 DIAGNOSIS — Z8249 Family history of ischemic heart disease and other diseases of the circulatory system: Secondary | ICD-10-CM | POA: Diagnosis not present

## 2024-01-06 DIAGNOSIS — E785 Hyperlipidemia, unspecified: Secondary | ICD-10-CM | POA: Diagnosis not present

## 2024-01-06 DIAGNOSIS — Z87891 Personal history of nicotine dependence: Secondary | ICD-10-CM | POA: Diagnosis not present

## 2024-01-06 DIAGNOSIS — Z85820 Personal history of malignant melanoma of skin: Secondary | ICD-10-CM | POA: Diagnosis not present

## 2024-01-06 DIAGNOSIS — Z85038 Personal history of other malignant neoplasm of large intestine: Secondary | ICD-10-CM | POA: Diagnosis not present

## 2024-01-06 LAB — BASIC METABOLIC PANEL WITH GFR
Anion gap: 11 (ref 5–15)
BUN: 32 mg/dL — ABNORMAL HIGH (ref 8–23)
CO2: 22 mmol/L (ref 22–32)
Calcium: 9.2 mg/dL (ref 8.9–10.3)
Chloride: 103 mmol/L (ref 98–111)
Creatinine, Ser: 1.49 mg/dL — ABNORMAL HIGH (ref 0.44–1.00)
GFR, Estimated: 34 mL/min — ABNORMAL LOW (ref >60)
Glucose, Bld: 149 mg/dL — ABNORMAL HIGH (ref 70–99)
Potassium: 4.8 mmol/L (ref 3.5–5.1)
Sodium: 135 mmol/L (ref 135–145)

## 2024-01-06 LAB — CBC
HCT: 32.9 % — ABNORMAL LOW (ref 36.0–46.0)
Hemoglobin: 10.8 g/dL — ABNORMAL LOW (ref 12.0–15.0)
MCH: 31.4 pg (ref 26.0–34.0)
MCHC: 32.8 g/dL (ref 30.0–36.0)
MCV: 95.6 fL (ref 80.0–100.0)
Platelets: 184 K/uL (ref 150–400)
RBC: 3.44 MIL/uL — ABNORMAL LOW (ref 3.87–5.11)
RDW: 15 % (ref 11.5–15.5)
WBC: 9.9 K/uL (ref 4.0–10.5)
nRBC: 0.9 % — ABNORMAL HIGH (ref 0.0–0.2)

## 2024-01-06 LAB — HEMOGLOBIN A1C
Hgb A1c MFr Bld: 7.6 % — ABNORMAL HIGH (ref 4.8–5.6)
Mean Plasma Glucose: 171 mg/dL

## 2024-01-06 LAB — GLUCOSE, CAPILLARY
Glucose-Capillary: 138 mg/dL — ABNORMAL HIGH (ref 70–99)
Glucose-Capillary: 148 mg/dL — ABNORMAL HIGH (ref 70–99)
Glucose-Capillary: 99 mg/dL (ref 70–99)
Glucose-Capillary: 131 mg/dL — ABNORMAL HIGH (ref 70–99)

## 2024-01-06 MED ORDER — SALINE SPRAY 0.65 % NA SOLN
1.0000 | NASAL | Status: DC | PRN
  Filled 2024-01-06 (×3): qty 44

## 2024-01-06 MED ORDER — IPRATROPIUM-ALBUTEROL 0.5-2.5 (3) MG/3ML IN SOLN
3.0000 mL | RESPIRATORY_TRACT | Status: DC | PRN
  Administered 2024-01-06 – 2024-01-10 (×3): 3 mL via RESPIRATORY_TRACT
  Filled 2024-01-06 (×9): qty 3

## 2024-01-06 MED ORDER — GUAIFENESIN-DM 100-10 MG/5ML PO SYRP
5.0000 mL | ORAL_SOLUTION | ORAL | Status: DC | PRN
  Administered 2024-01-06 – 2024-01-10 (×4): 5 mL via ORAL
  Filled 2024-01-06 (×12): qty 5

## 2024-01-06 NOTE — Evaluation (Signed)
 Physical Therapy Evaluation Patient Details Name: KIRRAH MUSTIN MRN: [id number] DOB: 1940-09-05 Today's Date: 01/06/2024  History of Present Illness  Briana Ochoa is a 83 y.o. female with medical history significant of multiple primary cancers to include melanoma, breast cancer, bladder cancer, colon cancer, now undergoing treatment for non-small cell lung cancer who has left lower extremity presenting cellulitis.  Clinical Impression  Pt admitted with above diagnosis. Pt in bed, son present at bedside, but not present for full session. Pt able to come to sit edge of bed with HOB elevated, continues to standing. Pt voids bladder briefly in standing, able to complete distance to the bathroom. With supervision A and RW. Pt dons briefs in sitting, able to pull up and adjust in standing without external support and proceeds to recliner chair. She inquired about chronic L knee pain. States that it has overall been worsening, present 5-10 years and prior to onset of cellulitis. She states that symptoms are worse now. Pt completes repeated end range knee extension in sitting which she initially felt improved symptoms but was not significant. Educated on likely compounded symptoms in the LLE at this time and encouraged pt to follow up with outpatient PT when she is ready. Pt appreciative of session and able to demonstrate functional ability to navigate home setup when medically ready for dc. Pt currently with functional limitations due to the deficits listed below (see PT Problem List). Pt will benefit from acute skilled PT to increase their independence and safety with mobility to allow discharge.           If plan is discharge home, recommend the following: A little help with walking and/or transfers;Help with stairs or ramp for entrance;Assistance with cooking/housework;A little help with bathing/dressing/bathroom   Can travel by private vehicle        Equipment Recommendations None  recommended by PT  Recommendations for Other Services       Functional Status Assessment Patient has had a recent decline in their functional status and demonstrates the ability to make significant improvements in function in a reasonable and predictable amount of time.     Precautions / Restrictions Precautions Precautions: Fall Recall of Precautions/Restrictions: Intact Restrictions Weight Bearing Restrictions Per Provider Order: No      Mobility  Bed Mobility Overal bed mobility: Modified Independent             General bed mobility comments: inc time    Transfers Overall transfer level: Modified independent Equipment used: Rolling walker (2 wheels)               General transfer comment: sit to stand from bed and toilet, no assist required    Ambulation/Gait Ambulation/Gait assistance: Supervision Gait Distance (Feet): 25 Feet Assistive device: Rolling walker (2 wheels) Gait Pattern/deviations: Antalgic, Decreased stance time - left, Decreased stride length, Step-through pattern Gait velocity: dec     General Gait Details: Pt has dec cadence, biases LLE in weight bearing due to chronic LLE pain and the acute pain associated with cellulitis. Pt able to complete reciprocal pattern, asymmetric, no LOB and good directional changes, distance limited due to pending imaging  Stairs Stairs: Yes     Number of Stairs: 2 General stair comments: Pt demonstrates the requisite strength, stability, and activity tolerance to complete 2 steps per home setup with up to CGA as needed from family, anticipate less assist for this task based on movement patterns today  Wheelchair Mobility     Tilt Bed  Modified Rankin (Stroke Patients Only)       Balance Overall balance assessment: Mild deficits observed, not formally tested                                           Pertinent Vitals/Pain Pain Assessment Pain Assessment: 0-10 Pain Score: 5   Pain Location: L LE, chronic knee pain L Pain Descriptors / Indicators: Aching, Constant, Discomfort, Guarding, Tightness Pain Intervention(s): Limited activity within patient's tolerance, Monitored during session    Home Living Family/patient expects to be discharged to:: Private residence Living Arrangements: Children Available Help at Discharge: Family Type of Home: House Home Access: Stairs to enter Entrance Stairs-Rails: Right Entrance Stairs-Number of Steps: 2   Home Layout: One level Home Equipment: Agricultural Consultant (2 wheels);Shower seat;Cane - single point;Grab bars - tub/shower;Grab bars - toilet      Prior Function Prior Level of Function : Independent/Modified Independent                     Extremity/Trunk Assessment   Upper Extremity Assessment Upper Extremity Assessment: Overall WFL for tasks assessed    Lower Extremity Assessment Lower Extremity Assessment: Generalized weakness;LLE deficits/detail LLE Deficits / Details: chronic knee pain inc with bout of cellulitis, symptoms of pain due to cellulitis       Communication   Communication Communication: Impaired Factors Affecting Communication: Hearing impaired    Cognition Arousal: Alert Behavior During Therapy: WFL for tasks assessed/performed   PT - Cognitive impairments: No apparent impairments                         Following commands: Intact       Cueing Cueing Techniques: Verbal cues, Gestural cues     General Comments General comments (skin integrity, edema, etc.): Pt able to stand and pull up briefs without external support    Exercises General Exercises - Lower Extremity Quad Sets: AROM, 10 reps, Left, Seated   Assessment/Plan    PT Assessment Patient needs continued PT services  PT Problem List Decreased mobility;Decreased activity tolerance;Decreased balance       PT Treatment Interventions      PT Goals (Current goals can be found in the Care Plan section)   Acute Rehab PT Goals Patient Stated Goal: decrease pain, restore mobility PT Goal Formulation: With patient Time For Goal Achievement: 01/20/24 Potential to Achieve Goals: Good    Frequency Min 2X/week     Co-evaluation               AM-PAC PT 6 Clicks Mobility  Outcome Measure Help needed turning from your back to your side while in a flat bed without using bedrails?: None Help needed moving from lying on your back to sitting on the side of a flat bed without using bedrails?: None Help needed moving to and from a bed to a chair (including a wheelchair)?: None Help needed standing up from a chair using your arms (e.g., wheelchair or bedside chair)?: None Help needed to walk in hospital room?: A Little Help needed climbing 3-5 steps with a railing? : A Little 6 Click Score: 22    End of Session Equipment Utilized During Treatment: Gait belt Activity Tolerance: Patient tolerated treatment well Patient left: in chair;with call bell/phone within reach Nurse Communication: Mobility status PT Visit Diagnosis: Other abnormalities of gait and  mobility (R26.89);Pain Pain - Right/Left: Left Pain - part of body: Knee;Leg    Time: 8691-8652 PT Time Calculation (min) (ACUTE ONLY): 39 min   Charges:   PT Evaluation $PT Eval Low Complexity: 1 Low PT Treatments $Gait Training: 8-22 mins $Therapeutic Activity: 8-22 mins PT General Charges $$ ACUTE PT VISIT: 1 Visit         Stann, PT Acute Rehabilitation Services Office: [phone] 01/06/2024   Stann DELENA Ohara 01/06/2024, 2:30 PM

## 2024-01-06 NOTE — Progress Notes (Signed)
 PROGRESS NOTE  Briana Ochoa  FMW:[id number] DOB: 16-Oct-1940 DOA: 01/05/2024 PCP: [name], NP   Brief Narrative: Patient is a 83 year old female with history of multiple cancers including melanoma, breast cancer, bladder cancer, colon cancer, non-small cell lung cancer currently on treatment presented with left lower extremity pain, edema, erythema.  She has been treated with doxycycline  twice as an outpatient.  Since the cellulitis is not improving, she presented to the emergency department.  On presentation, she was afebrile did not have any leukocytosis.  Lab work showed creatinine of 1.7.  Patient admitted for further management of leg cellulitis, AKI on CKD  Assessment & Plan:  Principal Problem:   Lower extremity cellulitis Active Problems:   HTN (hypertension)   Stage 3b chronic kidney disease (HCC)   History of tobacco abuse   Hyperlipidemia associated with type 2 diabetes mellitus (HCC)   History of DVT of lower extremity   B12 deficiency   History of breast cancer   History of colon cancer   NSCLC metastatic to bone (HCC)  Left lower extremity cellulitis: Presented with erythema, edema, pain of the left lower extremity.  Was treated with 2 courses of doxycycline .  Started on ceftriaxone  and vancomycin here.  Follow-up cultures.  Leukocytosis improving. Lactate was mild elevated on admission in the range of 2.  Elevated D-dimer: Outpatient ultrasound was negative for DVT.  Repeat vascular ultrasound of the lower extremities ordered.  History of non-small cell lung cancer: Currently on chemotherapy and follows with oncology.  Also has history of colon cancer, breast cancer: Currently on remission  Diabetes type 2: On pravastatin   AKI on CKD stage IIIb: Baseline creatinine 1.4.  Given IV fluids.  Kidney function back to baseline  Hypertension: On lisinopril   Debility/deconditioning: Will consult PT. patient lives with her daughter.  She is ambulatory at  baseline when she is good           DVT prophylaxis:enoxaparin  (LOVENOX ) injection 30 mg Start: 01/05/24 2200     Code Status: Full Code  Family Communication: Called and discussed with daughter Shona ph phone on 11/29  Patient status: Observation  Patient is from : Home  Anticipated discharge to: Home  Estimated DC date: 1 to 2 days   Consultants: None  Procedures: None  Antimicrobials:  Anti-infectives (From admission, onward)    Start     Dose/Rate Route Frequency Ordered Stop   01/06/24 1600  vancomycin (VANCOCIN) IVPB 1000 mg/200 mL premix        1,000 mg 200 mL/hr over 60 Minutes Intravenous Every 48 hours 01/05/24 1736     01/06/24 1000  cefTRIAXone  (ROCEPHIN ) 1 g in sodium chloride  0.9 % 100 mL IVPB        1 g 200 mL/hr over 30 Minutes Intravenous Every 24 hours 01/05/24 1642     01/06/24 0000  vancomycin (VANCOCIN) IVPB 1000 mg/200 mL premix  Status:  Discontinued        1,000 mg 200 mL/hr over 60 Minutes Intravenous  Once 01/05/24 1642 01/05/24 1650   01/05/24 1600  vancomycin (VANCOREADY) IVPB 1500 mg/300 mL        1,500 mg 150 mL/hr over 120 Minutes Intravenous  Once 01/05/24 1553 01/05/24 1813   01/05/24 1545  cefTRIAXone  (ROCEPHIN ) 2 g in sodium chloride  0.9 % 100 mL IVPB        2 g 200 mL/hr over 30 Minutes Intravenous  Once 01/05/24 1544 01/05/24 1641       Subjective: Patient  seen and examined at bedside today.  Overall comfortable.  Lying on bed.  Complains of some discomfort on the left lower extremity.  Left lower extremity cellulitis might have improved, can see some dryness and shrinkage of the skin.  No weeping or ulcers.  Objective: Vitals:   01/05/24 2118 01/06/24 0142 01/06/24 0235 01/06/24 0529  BP: 131/67 (!) 143/65  (!) 161/69  Pulse: 85 86  84  Resp: 20 19  18   Temp: 99.1 F (37.3 C) 98.5 F (36.9 C)  99.6 F (37.6 C)  TempSrc: Oral Oral  Oral  SpO2: 94% 91% 92% 93%  Weight:      Height:        Intake/Output Summary  (Last 24 hours) at 01/06/2024 0818 Last data filed at 01/06/2024 9472 Gross per 24 hour  Intake 786.25 ml  Output 0 ml  Net 786.25 ml   Filed Weights   01/05/24 1329  Weight: 87.1 kg    Examination:  General exam: Overall comfortable, not in distress, pleasant elderly female HEENT: PERRL Respiratory system:  no wheezes or crackles  Cardiovascular system: S1 & S2 heard, RRR.  Gastrointestinal system: Abdomen is nondistended, soft and nontender. Central nervous system: Alert and oriented Extremities: Left lower extremity edema, erythema  skin: no ulcers,no icterus     Data Reviewed: I have personally reviewed following labs and imaging studies  CBC: Recent Labs  Lab 01/05/24 1503 01/06/24 0535  WBC 9.0 9.9  NEUTROABS 6.2  --   HGB 12.5 10.8*  HCT 38.6 32.9*  MCV 96.7 95.6  PLT 167 184   Basic Metabolic Panel: Recent Labs  Lab 01/05/24 1503 01/06/24 0535  NA 135 135  K 5.1 4.8  CL 101 103  CO2 23 22  GLUCOSE 142* 149*  BUN 36* 32*  CREATININE 1.71* 1.49*  CALCIUM  9.7 9.2     Recent Results (from the past 240 hours)  Blood culture (routine x 2)     Status: None (Preliminary result)   Collection Time: 01/05/24  3:03 PM   Specimen: BLOOD LEFT ARM  Result Value Ref Range Status   Specimen Description   Final    BLOOD LEFT ARM Performed at [facility], [address], [place], [place] [address]    Special Requests   Final    BOTTLES DRAWN AEROBIC AND ANAEROBIC Blood Culture results may not be optimal due to an inadequate volume of blood received in culture bottles Performed at [facility], [address] [place], [place] [address]    Culture PENDING  Incomplete   Report Status PENDING  Incomplete  Resp panel by RT-PCR (RSV, Flu A&B, Covid) Anterior Nasal Swab     Status: None   Collection Time: 01/05/24  3:14 PM   Specimen: Anterior Nasal Swab  Result Value Ref Range Status   SARS Coronavirus 2 by RT PCR NEGATIVE NEGATIVE  Final    Comment: (NOTE) SARS-CoV-2 target nucleic acids are NOT DETECTED.  The SARS-CoV-2 RNA is generally detectable in upper respiratory specimens during the acute phase of infection. The lowest concentration of SARS-CoV-2 viral copies this assay can detect is 138 copies/mL. A negative result does not preclude SARS-Cov-2 infection and should not be used as the sole basis for treatment or other patient management decisions. A negative result may occur with  improper specimen collection/handling, submission of specimen other than nasopharyngeal swab, presence of viral mutation(s) within the areas targeted by this assay, and inadequate number of viral copies(<138  copies/mL). A negative result must be combined with clinical observations, patient history, and epidemiological information. The expected result is Negative.  Fact Sheet for Patients:  bloggercourse.com  Fact Sheet for Healthcare Providers:  seriousbroker.it  This test is no t yet approved or cleared by the [place]  FDA and  has been authorized for detection and/or diagnosis of SARS-CoV-2 by FDA under an Emergency Use Authorization (EUA). This EUA will remain  in effect (meaning this test can be used) for the duration of the COVID-19 declaration under Section 564(b)(1) of the Act, 21 U.S.C.section 360bbb-3(b)(1), unless the authorization is terminated  or revoked sooner.       Influenza A by PCR NEGATIVE NEGATIVE Final   Influenza B by PCR NEGATIVE NEGATIVE Final    Comment: (NOTE) The Xpert Xpress SARS-CoV-2/FLU/RSV plus assay is intended as an aid in the diagnosis of influenza from Nasopharyngeal swab specimens and should not be used as a sole basis for treatment. Nasal washings and aspirates are unacceptable for Xpert Xpress SARS-CoV-2/FLU/RSV testing.  Fact Sheet for Patients: bloggercourse.com  Fact Sheet for Healthcare  Providers: seriousbroker.it  This test is not yet approved or cleared by the [place]  FDA and has been authorized for detection and/or diagnosis of SARS-CoV-2 by FDA under an Emergency Use Authorization (EUA). This EUA will remain in effect (meaning this test can be used) for the duration of the COVID-19 declaration under Section 564(b)(1) of the Act, 21 U.S.C. section 360bbb-3(b)(1), unless the authorization is terminated or revoked.     Resp Syncytial Virus by PCR NEGATIVE NEGATIVE Final    Comment: (NOTE) Fact Sheet for Patients: bloggercourse.com  Fact Sheet for Healthcare Providers: seriousbroker.it  This test is not yet approved or cleared by the [place]  FDA and has been authorized for detection and/or diagnosis of SARS-CoV-2 by FDA under an Emergency Use Authorization (EUA). This EUA will remain in effect (meaning this test can be used) for the duration of the COVID-19 declaration under Section 564(b)(1) of the Act, 21 U.S.C. section 360bbb-3(b)(1), unless the authorization is terminated or revoked.  Performed at [facility], [address] [place], [place] [address]   Blood culture (routine x 2)     Status: None (Preliminary result)   Collection Time: 01/05/24  5:19 PM   Specimen: BLOOD RIGHT HAND  Result Value Ref Range Status   Specimen Description   Final    BLOOD RIGHT HAND Performed at [facility], [address], [place], [place] [address]    Special Requests   Final    BOTTLES DRAWN AEROBIC ONLY Blood Culture adequate volume Performed at [facility], [address], [place], [place] [address]    Culture PENDING  Incomplete   Report Status PENDING  Incomplete     Radiology Studies: No results found.  Scheduled Meds:  aspirin  EC  81 mg Oral Daily   enoxaparin  (LOVENOX ) injection  30 mg Subcutaneous Q24H   folic acid   1  mg Oral Daily   insulin  aspart  0-9 Units Subcutaneous TID WC   lisinopril   20 mg Oral Daily   pravastatin   20 mg Oral QPM   Continuous Infusions:  cefTRIAXone  (ROCEPHIN )  IV 1 g (01/06/24 0815)   lactated ringers  75 mL/hr at 01/06/24 0507   vancomycin       LOS: 0 days   Ivonne Mustache, MD [facility] P11/29/2025, 8:18 AM

## 2024-01-06 NOTE — Plan of Care (Signed)
   Problem: Nutrition: Goal: Adequate nutrition will be maintained Outcome: Progressing   Problem: Pain Managment: Goal: General experience of comfort will improve and/or be controlled Outcome: Progressing   Problem: Safety: Goal: Ability to remain free from injury will improve Outcome: Progressing

## 2024-01-06 NOTE — Care Management Obs Status (Signed)
 MEDICARE OBSERVATION STATUS NOTIFICATION   Patient Details  Name: Briana Ochoa MRN: [id number] Date of Birth: Jan 12, 1941   Medicare Observation Status Notification Given:  Yes    Sonda Manuella Quill, RN 01/06/2024, 11:00 AM

## 2024-01-06 NOTE — TOC Initial Note (Signed)
 Transition of Care [facility]) - Initial/Assessment Note    Patient Details  Name: Briana Ochoa MRN: [id number] Date of Birth: Apr 23, 1940  [facility] of [facility]) CM/SW Contact:    Sonda Manuella Quill, RN Phone Number: 01/06/2024, 11:26 AM  Clinical Narrative:                 Briana Ochoa w/ pt in room; pt said she lives at home w/ her dtr Shona Kent [phone]); she plans to return w/ family support at d/c; her dtr will provide transportation; insurance/PCP verified; pt denied SDOH risks; pt has walker; she does not have HH services or home oxygen; awaiting PT eval; IP CM will follow.  Expected Discharge Plan: Home/Self Care Barriers to Discharge: Continued Medical Work up   Patient Goals and CMS Choice Patient states their goals for this hospitalization and ongoing recovery are:: home     [place] ownership interest in [facility].provided to:: Patient    Expected Discharge Plan and Services   Discharge Planning Services: CM Consult   Living arrangements for the past 2 months: Single Family Home                 DME Arranged: N/A DME Agency: NA       HH Arranged: NA HH Agency: NA        Prior Living Arrangements/Services Living arrangements for the past 2 months: Single Family Home Lives with:: Adult Children Patient language and need for interpreter reviewed:: Yes Do you feel safe going back to the place where you live?: Yes      Need for Family Participation in Patient Care: Yes (Comment) Care giver support system in place?: Yes (comment) Current home services: DME (walker) Criminal Activity/Legal Involvement Pertinent to Current Situation/Hospitalization: No - Comment as needed  Activities of Daily Living   ADL Screening (condition at time of admission) Independently performs ADLs?: Yes (appropriate for developmental age) Is the patient deaf or have difficulty hearing?: Yes Does the patient have difficulty seeing, even when wearing  glasses/contacts?: No Does the patient have difficulty concentrating, remembering, or making decisions?: No  Permission Sought/Granted Permission sought to share information with : Case Manager Permission granted to share information with : Yes, Verbal Permission Granted  Share Information with NAME: Case Manager        Permission granted to share info w Contact Information: Shona Kent (dtr) [phone]  Emotional Assessment Appearance:: Appears stated age Attitude/Demeanor/Rapport: Gracious Affect (typically observed): Accepting Orientation: : Oriented to Self, Oriented to Place, Oriented to  Time, Oriented to Situation   Psych Involvement: No (comment)  Admission diagnosis:  Lower extremity cellulitis [L03.119] Cellulitis of left lower leg [L03.116] Patient Active Problem List   Diagnosis Date Noted   Lower extremity cellulitis 01/05/2024   Venous ulcer of left leg (HCC) 12/25/2023   Chronic venous insufficiency 12/25/2023   NSCLC metastatic to bone (HCC) 11/20/2023   Chronic bronchitis, unspecified chronic bronchitis type (HCC) 07/11/2023   Pulmonary nodules 04/02/2021   History of breast cancer 04/02/2021   History of bladder cancer 04/02/2021   History of colon cancer 04/02/2021   History of tobacco use 04/02/2021   B12 deficiency 03/27/2021   Diabetic retinopathy (HCC) 12/03/2020   Genetic testing 07/30/2020   Family history of kidney cancer    Family history of breast cancer    Family history of uterine cancer    Family history of throat cancer    Cancer of left colon (HCC) 05/28/2020   History of  DVT of lower extremity 04/16/2020   Iron deficiency anemia due to chronic blood loss 04/09/2020   Hyperlipidemia associated with type 2 diabetes mellitus (HCC) 04/09/2020   Positive D dimer 04/08/2020   Symptomatic anemia 04/08/2020   Diabetes mellitus (HCC) 11/19/2018   Bladder cancer (HCC) 02/09/2018   Weakness 09/28/2017   Hematuria 09/28/2017   DOE (dyspnea  on exertion) 09/28/2017   Malignant melanoma of lower leg, right (HCC) 12/28/2015   Cerebral arterial aneurysm 09/16/2013   Ductal carcinoma in situ (DCIS) of left breast 08/22/2013   Right internal carotid artery aneurysm 08/21/2013   Meningioma, multiple (HCC) 08/21/2013   Stage 3b chronic kidney disease (HCC) 07/17/2013   History of tobacco abuse 07/17/2013   HTN (hypertension) 04/16/2013   OA (osteoarthritis) of knee 04/16/2013   PCP:  [name], NP Pharmacy:   [facility] - [place], [place] - [address] [place] [address] Phone: [phone] Fax: [phone]  [facility] - [place], [place] - [address] [place] [address] Phone: [phone] Fax: [phone]  [place] [facility] [address] [place] [address] Phone: [phone] Fax: [phone]     Social Drivers of Health (SDOH) Social History: SDOH Screenings   Food Insecurity: No Food Insecurity (01/06/2024)  Housing: Low Risk  (01/06/2024)  Transportation Needs: No Transportation Needs (01/06/2024)  Utilities: Not At Risk (01/06/2024)  Depression (PHQ2-9): Low Risk  (12/04/2023)  Physical Activity: Unknown (04/27/2018)  Social Connections: Moderately Integrated (01/05/2024)  Stress: No Stress Concern Present (04/27/2018)  Tobacco Use: Medium Risk (01/05/2024)   SDOH Interventions: Food Insecurity Interventions: Intervention Not Indicated, Inpatient TOC Housing Interventions: Intervention Not Indicated, Inpatient TOC Transportation Interventions: Intervention Not Indicated, Inpatient TOC Utilities Interventions: Intervention Not Indicated, Inpatient TOC   Readmission Risk Interventions     No data to display

## 2024-01-07 ENCOUNTER — Inpatient Hospital Stay (HOSPITAL_COMMUNITY)

## 2024-01-07 DIAGNOSIS — L03116 Cellulitis of left lower limb: Secondary | ICD-10-CM | POA: Diagnosis not present

## 2024-01-07 DIAGNOSIS — R52 Pain, unspecified: Secondary | ICD-10-CM | POA: Diagnosis not present

## 2024-01-07 LAB — BASIC METABOLIC PANEL WITH GFR
Anion gap: 12 (ref 5–15)
BUN: 26 mg/dL — ABNORMAL HIGH (ref 8–23)
CO2: 20 mmol/L — ABNORMAL LOW (ref 22–32)
Calcium: 9.8 mg/dL (ref 8.9–10.3)
Chloride: 102 mmol/L (ref 98–111)
Creatinine, Ser: 1.28 mg/dL — ABNORMAL HIGH (ref 0.44–1.00)
GFR, Estimated: 41 mL/min — ABNORMAL LOW (ref >60)
Glucose, Bld: 148 mg/dL — ABNORMAL HIGH (ref 70–99)
Potassium: 4.8 mmol/L (ref 3.5–5.1)
Sodium: 133 mmol/L — ABNORMAL LOW (ref 135–145)

## 2024-01-07 LAB — CBC
HCT: 35.1 % — ABNORMAL LOW (ref 36.0–46.0)
Hemoglobin: 11.3 g/dL — ABNORMAL LOW (ref 12.0–15.0)
MCH: 31.1 pg (ref 26.0–34.0)
MCHC: 32.2 g/dL (ref 30.0–36.0)
MCV: 96.7 fL (ref 80.0–100.0)
Platelets: 223 K/uL (ref 150–400)
RBC: 3.63 MIL/uL — ABNORMAL LOW (ref 3.87–5.11)
RDW: 14.9 % (ref 11.5–15.5)
WBC: 12.5 K/uL — ABNORMAL HIGH (ref 4.0–10.5)
nRBC: 0.4 % — ABNORMAL HIGH (ref 0.0–0.2)

## 2024-01-07 LAB — GLUCOSE, CAPILLARY
Glucose-Capillary: 141 mg/dL — ABNORMAL HIGH (ref 70–99)
Glucose-Capillary: 143 mg/dL — ABNORMAL HIGH (ref 70–99)

## 2024-01-07 MED ORDER — LINEZOLID 600 MG/300ML IV SOLN: 600.0000 mg | Freq: Two times a day (BID) | INTRAVENOUS | Status: DC

## 2024-01-07 MED ORDER — POLYETHYLENE GLYCOL 3350 17 G PO PACK
17.0000 g | PACK | Freq: Every day | ORAL | Status: DC | PRN
  Filled 2024-01-07: qty 1

## 2024-01-07 MED ORDER — PROCHLORPERAZINE MALEATE 10 MG PO TABS
10.0000 mg | ORAL_TABLET | Freq: Four times a day (QID) | ORAL | Status: DC | PRN
  Filled 2024-01-07 (×4): qty 1

## 2024-01-07 MED ORDER — LINEZOLID 600 MG/300ML IV SOLN
600.0000 mg | Freq: Two times a day (BID) | INTRAVENOUS | Status: DC
  Administered 2024-01-07 – 2024-01-08 (×3): 600 mg via INTRAVENOUS
  Filled 2024-01-07 (×7): qty 300

## 2024-01-07 MED ORDER — ONDANSETRON HCL 4 MG PO TABS
4.0000 mg | ORAL_TABLET | Freq: Four times a day (QID) | ORAL | Status: DC | PRN
  Administered 2024-01-08 – 2024-01-10 (×4): 4 mg via ORAL
  Filled 2024-01-07 (×12): qty 1

## 2024-01-07 MED ORDER — ENOXAPARIN SODIUM 40 MG/0.4ML IJ SOSY: 40.0000 mg | PREFILLED_SYRINGE | INTRAMUSCULAR | Status: DC

## 2024-01-07 MED ORDER — ACETAMINOPHEN 500 MG PO TABS
1000.0000 mg | ORAL_TABLET | Freq: Three times a day (TID) | ORAL | Status: DC
  Administered 2024-01-07 – 2024-01-09 (×5): 1000 mg via ORAL
  Filled 2024-01-07 (×10): qty 2

## 2024-01-07 NOTE — Progress Notes (Signed)
 Spoke to charge RN team will be heading to patient for transfer. Shared with charge RN to please wrap patients PIV, and that this is a transfer no discharge tasks to complete, Pt has ID band.  Preadmit held current health kits are proud lynx 3 peripheral is sweet bamboo 9. Contact made with daughter calling her to provide eta for arrival at home.

## 2024-01-07 NOTE — Progress Notes (Signed)
 PROGRESS NOTE  Briana Ochoa  FMW:[id number] DOB: [birth year] DOA: 01/05/2024 PCP: [name], NP   Brief Narrative: Patient is a 83 year old female with history of multiple cancers including melanoma, breast cancer, bladder cancer, colon cancer, non-small cell lung cancer currently on treatment presented with left lower extremity pain, edema, erythema.  She has been treated with doxycycline  twice as an outpatient.  Since the cellulitis is not improving, she presented to the emergency department.  On presentation, she was afebrile did not have any leukocytosis.  Lab work showed creatinine of 1.7.  Patient admitted for further management of leg cellulitis, AKI on CKD.  She has been considered for hospital at home today  Assessment & Plan:  Principal Problem:   Lower extremity cellulitis Active Problems:   HTN (hypertension)   Stage 3b chronic kidney disease (HCC)   History of tobacco abuse   Hyperlipidemia associated with type 2 diabetes mellitus (HCC)   History of DVT of lower extremity   B12 deficiency   History of breast cancer   History of colon cancer   NSCLC metastatic to bone (HCC)  Left lower extremity cellulitis: Presented with erythema, edema, pain of the left lower extremity.  Was treated with 2 courses of doxycycline .  Started on ceftriaxone  and vancomycin here.  Follow-up cultures.  Leukocytosis persists but she is afebrile. Lactate was mild elevated on admission in the range of 2. Continues to complain of left lower extremity discomfort.  Left lower extremity appears edematous,erythematous  Elevated D-dimer: Outpatient ultrasound was negative for DVT.  Repeat vascular ultrasound of the lower extremities ordered.  History of non-small cell lung cancer: Currently on chemotherapy and follows with oncology.  Also has history of colon cancer, breast cancer: Currently on remission  Diabetes type 2: On pravastatin   AKI on CKD stage IIIb: Baseline creatinine 1.4.   Given IV fluids.  Kidney function back to baseline  Hypertension: On lisinopril   Debility/deconditioning: . patient lives with her daughter.  She is ambulatory at baseline when she is good.  PT recommended home health           DVT prophylaxis:enoxaparin  (LOVENOX ) injection 30 mg Start: 01/05/24 2200     Code Status: Full Code  Family Communication: Called and discussed with daughter Shona on phone on 11/29  Patient status: Inpatient  Patient is from : Home  Anticipated discharge to: Hospital at Home  Estimated DC date: today   Consultants: None  Procedures: None  Antimicrobials:  Anti-infectives (From admission, onward)    Start     Dose/Rate Route Frequency Ordered Stop   01/06/24 1600  vancomycin (VANCOCIN) IVPB 1000 mg/200 mL premix        1,000 mg 200 mL/hr over 60 Minutes Intravenous Every 48 hours 01/05/24 1736     01/06/24 1000  cefTRIAXone  (ROCEPHIN ) 1 g in sodium chloride  0.9 % 100 mL IVPB        1 g 200 mL/hr over 30 Minutes Intravenous Every 24 hours 01/05/24 1642     01/06/24 0000  vancomycin (VANCOCIN) IVPB 1000 mg/200 mL premix  Status:  Discontinued        1,000 mg 200 mL/hr over 60 Minutes Intravenous  Once 01/05/24 1642 01/05/24 1650   01/05/24 1600  vancomycin (VANCOREADY) IVPB 1500 mg/300 mL        1,500 mg 150 mL/hr over 120 Minutes Intravenous  Once 01/05/24 1553 01/05/24 1813   01/05/24 1545  cefTRIAXone  (ROCEPHIN ) 2 g in sodium chloride  0.9 % 100 mL  IVPB        2 g 200 mL/hr over 30 Minutes Intravenous  Once 01/05/24 1544 01/05/24 1641       Subjective: Patient seen and examined at bedside today.  Sitting on the chair.  Afebrile.  Still very concerned about her left lower extremity cellulitis.  Left lower extremity remains erythematous and mildly edematous.  Does not appear to be worsening.  We discussed about continuing current antibiotics and following up on the venous Doppler report.  She might be discharged today with hospital at  home  Objective: Vitals:   01/06/24 0529 01/06/24 1350 01/06/24 2007 01/07/24 0640  BP: (!) 161/69 (!) 123/91 (!) 152/66 131/73  Pulse: 84 85 89 87  Resp: 18 18 16 12   Temp: 99.6 F (37.6 C) 98.8 F (37.1 C) 98.5 F (36.9 C) (!) 97.4 F (36.3 C)  TempSrc: Oral Oral Oral Oral  SpO2: 93% 95% 95% 94%  Weight:      Height:        Intake/Output Summary (Last 24 hours) at 01/07/2024 1019 Last data filed at 01/07/2024 9095 Gross per 24 hour  Intake 360 ml  Output 400 ml  Net -40 ml   Filed Weights   01/05/24 1329  Weight: 87.1 kg    Examination:  General exam: Overall comfortable, not in distress, pleasant elderly female HEENT: PERRL Respiratory system:  no wheezes or crackles  Cardiovascular system: S1 & S2 heard, RRR.  Gastrointestinal system: Abdomen is nondistended, soft and nontender. Central nervous system: Alert and oriented Extremities: Left lower extremity edema, erythema Skin: No rashes, no ulcers,no icterus     Data Reviewed: I have personally reviewed following labs and imaging studies  CBC: Recent Labs  Lab 01/05/24 1503 01/06/24 0535 01/07/24 0645  WBC 9.0 9.9 12.5*  NEUTROABS 6.2  --   --   HGB 12.5 10.8* 11.3*  HCT 38.6 32.9* 35.1*  MCV 96.7 95.6 96.7  PLT 167 184 223   Basic Metabolic Panel: Recent Labs  Lab 01/05/24 1503 01/06/24 0535 01/07/24 0645  NA 135 135 133*  K 5.1 4.8 4.8  CL 101 103 102  CO2 23 22 20*  GLUCOSE 142* 149* 148*  BUN 36* 32* 26*  CREATININE 1.71* 1.49* 1.28*  CALCIUM  9.7 9.2 9.8     Recent Results (from the past 240 hours)  Blood culture (routine x 2)     Status: None (Preliminary result)   Collection Time: 01/05/24  3:03 PM   Specimen: BLOOD LEFT ARM  Result Value Ref Range Status   Specimen Description   Final    BLOOD LEFT ARM Performed at [facility], [address], [place], [place] [address]    Special Requests   Final    BOTTLES DRAWN AEROBIC AND ANAEROBIC Blood Culture results may not  be optimal due to an inadequate volume of blood received in culture bottles Performed at [facility], [address], [place], [place] [address]    Culture   Final    NO GROWTH 2 DAYS Performed at [facility], [address], [place], [place] [address]    Report Status PENDING  Incomplete  Resp panel by RT-PCR (RSV, Flu A&B, Covid) Anterior Nasal Swab     Status: None   Collection Time: 01/05/24  3:14 PM   Specimen: Anterior Nasal Swab  Result Value Ref Range Status   SARS Coronavirus 2 by RT PCR NEGATIVE NEGATIVE Final    Comment: (NOTE)  SARS-CoV-2 target nucleic acids are NOT DETECTED.  The SARS-CoV-2 RNA is generally detectable in upper respiratory specimens during the acute phase of infection. The lowest concentration of SARS-CoV-2 viral copies this assay can detect is 138 copies/mL. A negative result does not preclude SARS-Cov-2 infection and should not be used as the sole basis for treatment or other patient management decisions. A negative result may occur with  improper specimen collection/handling, submission of specimen other than nasopharyngeal swab, presence of viral mutation(s) within the areas targeted by this assay, and inadequate number of viral copies(<138 copies/mL). A negative result must be combined with clinical observations, patient history, and epidemiological information. The expected result is Negative.  Fact Sheet for Patients:  bloggercourse.com  Fact Sheet for Healthcare Providers:  seriousbroker.it  This test is no t yet approved or cleared by the [place]  FDA and  has been authorized for detection and/or diagnosis of SARS-CoV-2 by FDA under an Emergency Use Authorization (EUA). This EUA will remain  in effect (meaning this test can be used) for the duration of the COVID-19 declaration under Section 564(b)(1) of the Act, 21 U.S.C.section 360bbb-3(b)(1), unless the  authorization is terminated  or revoked sooner.       Influenza A by PCR NEGATIVE NEGATIVE Final   Influenza B by PCR NEGATIVE NEGATIVE Final    Comment: (NOTE) The Xpert Xpress SARS-CoV-2/FLU/RSV plus assay is intended as an aid in the diagnosis of influenza from Nasopharyngeal swab specimens and should not be used as a sole basis for treatment. Nasal washings and aspirates are unacceptable for Xpert Xpress SARS-CoV-2/FLU/RSV testing.  Fact Sheet for Patients: bloggercourse.com  Fact Sheet for Healthcare Providers: seriousbroker.it  This test is not yet approved or cleared by the [place]  FDA and has been authorized for detection and/or diagnosis of SARS-CoV-2 by FDA under an Emergency Use Authorization (EUA). This EUA will remain in effect (meaning this test can be used) for the duration of the COVID-19 declaration under Section 564(b)(1) of the Act, 21 U.S.C. section 360bbb-3(b)(1), unless the authorization is terminated or revoked.     Resp Syncytial Virus by PCR NEGATIVE NEGATIVE Final    Comment: (NOTE) Fact Sheet for Patients: bloggercourse.com  Fact Sheet for Healthcare Providers: seriousbroker.it  This test is not yet approved or cleared by the [place]  FDA and has been authorized for detection and/or diagnosis of SARS-CoV-2 by FDA under an Emergency Use Authorization (EUA). This EUA will remain in effect (meaning this test can be used) for the duration of the COVID-19 declaration under Section 564(b)(1) of the Act, 21 U.S.C. section 360bbb-3(b)(1), unless the authorization is terminated or revoked.  Performed at [facility], [address] [place], [place] [address]   Blood culture (routine x 2)     Status: None (Preliminary result)   Collection Time: 01/05/24  5:19 PM   Specimen: BLOOD RIGHT HAND  Result Value Ref Range Status    Specimen Description   Final    BLOOD RIGHT HAND Performed at [facility], [address] [place], [place] [address]    Special Requests   Final    BOTTLES DRAWN AEROBIC ONLY Blood Culture adequate volume Performed at [facility], [address], [place], [place] [address]    Culture   Final    NO GROWTH 2 DAYS Performed at [facility], [address] [place], [place] [address]    Report Status PENDING  Incomplete     Radiology Studies:  No results found.  Scheduled Meds:  aspirin  EC  81 mg Oral Daily   enoxaparin  (LOVENOX ) injection  30 mg Subcutaneous Q24H   folic acid   1 mg Oral Daily   insulin  aspart  0-9 Units Subcutaneous TID WC   lisinopril   20 mg Oral Daily   pravastatin   20 mg Oral QPM   Continuous Infusions:  cefTRIAXone  (ROCEPHIN )  IV 1 g (01/07/24 0804)   vancomycin 1,000 mg (01/06/24 1719)     LOS: 1 day   Ivonne Mustache, MD [facility] P11/30/2025, 10:19 AM

## 2024-01-07 NOTE — Progress Notes (Signed)
 Left lower extremity venous duplex has been completed. Preliminary results can be found in CV Proc through chart review.   01/07/24 9:55 AM Cathlyn Collet RVT

## 2024-01-07 NOTE — Progress Notes (Signed)
 This EMT went to room 1618 at [facility] to pick up pt for [facility] program. Upon arrival the pt was found lying supine in bed. The pt was receiving IV Linezolid and po Tylenol . The patient was awake and alert. The pt was ready to go home and was very excited to be leaving the hospital. The pt requested to use the restroom before transport. The pt ambulated with a walker to the bathroom. The pt then returned to the room where virtual RN Amen was called via TEAMS VIDEO. A virtual skin assessment was performed at this time. Photos were also taken via rover of the LLE and uploaded by the RN while this EMT was present. Redness and some slight irritation was noted between the pt's buttcheeks. This was documented by virtual RN Amen. A thin layer of barrier cream was applied to the affected area. The patient denies any pain or itching. The patient was then assisted in getting dressed for transport. The patient required minimal assistance in dressing. Once the pt was dressed her personal belongings were collected and placed in a pt belongings bag for transport. The pt them ambulated from the bed to the [facility] with the assistance of a walker with no issues. The pt was then taken outside and was secured in the [facility] van. The pt was then transported home. No incidents to report during transport. Upon arrival to the residence EMTP Johann and Ezzard were present on scene. The pt was then taken out of the [facility] van and rolled to the front porch. The pt's personal walker was brought outside and the pt ambulated with the walker into the house. The patient required minimal assistance up the stairs. Once inside the home the pt sat at the kitchen table and requested equipment be set up there. [facility] equipment was brought inside and set up accordingly. All equipment was explained to both the pt and her daughter who lives with the pt. Vital signs were then obtained and documented into the flowchart. All current health readings and vital sign readings were  verified with virtual RN Amen via tablet. All equipment was in working order. EMTP Hassell and Lewis then began to proceed with the remainder of the admission. No further questions were asked by the pt or her daughter.

## 2024-01-07 NOTE — Plan of Care (Signed)
  Problem: Education: Goal: Knowledge of General Education information will improve Description: Including pain rating scale, medication(s)/side effects and non-pharmacologic comfort measures Outcome: Progressing   Problem: Health Behavior/Discharge Planning: Goal: Ability to manage health-related needs will improve Outcome: Progressing   Problem: Clinical Measurements: Goal: Respiratory complications will improve Outcome: Progressing   Problem: Activity: Goal: Risk for activity intolerance will decrease Outcome: Progressing   Problem: Nutrition: Goal: Adequate nutrition will be maintained Outcome: Progressing   Problem: Pain Managment: Goal: General experience of comfort will improve and/or be controlled Outcome: Progressing   Problem: Skin Integrity: Goal: Risk for impaired skin integrity will decrease Outcome: Progressing   Problem: Metabolic: Goal: Ability to maintain appropriate glucose levels will improve Outcome: Progressing   Problem: Tissue Perfusion: Goal: Adequacy of tissue perfusion will improve Outcome: Progressing

## 2024-01-07 NOTE — Plan of Care (Signed)
  Problem: Education: Goal: Knowledge of General Education information will improve Description: Including pain rating scale, medication(s)/side effects and non-pharmacologic comfort measures Outcome: Progressing   Problem: Clinical Measurements: Goal: Ability to maintain clinical measurements within normal limits will improve Outcome: Progressing Goal: Will remain free from infection Outcome: Progressing Goal: Diagnostic test results will improve Outcome: Progressing Goal: Respiratory complications will improve Outcome: Progressing Goal: Cardiovascular complication will be avoided Outcome: Progressing   Problem: Activity: Goal: Risk for activity intolerance will decrease Outcome: Progressing   Problem: Nutrition: Goal: Adequate nutrition will be maintained Outcome: Progressing   Problem: Coping: Goal: Level of anxiety will decrease Outcome: Progressing   Problem: Elimination: Goal: Will not experience complications related to bowel motility Outcome: Progressing Goal: Will not experience complications related to urinary retention Outcome: Progressing   Problem: Pain Managment: Goal: General experience of comfort will improve and/or be controlled Outcome: Progressing   Problem: Safety: Goal: Ability to remain free from injury will improve Outcome: Progressing   Problem: Skin Integrity: Goal: Risk for impaired skin integrity will decrease Outcome: Progressing   Problem: Clinical Measurements: Goal: Ability to avoid or minimize complications of infection will improve Outcome: Progressing   Problem: Skin Integrity: Goal: Skin integrity will improve Outcome: Progressing   Problem: Coping: Goal: Ability to adjust to condition or change in health will improve Outcome: Progressing   Problem: Fluid Volume: Goal: Ability to maintain a balanced intake and output will improve Outcome: Progressing   Problem: Metabolic: Goal: Ability to maintain appropriate glucose  levels will improve Outcome: Progressing   Problem: Nutritional: Goal: Maintenance of adequate nutrition will improve Outcome: Progressing   Problem: Skin Integrity: Goal: Risk for impaired skin integrity will decrease Outcome: Progressing   Problem: Tissue Perfusion: Goal: Adequacy of tissue perfusion will improve Outcome: Progressing

## 2024-01-07 NOTE — Progress Notes (Signed)
 Pt was sitting on the recliner on arrival. Pt denies any pain / shortness of breath. Vitals were assessed and IV antibiotics were administered. Pt O2 remained in the lower 90s with clear lung sounds. Nurse on duty was notified. IV was flushed and locked.

## 2024-01-07 NOTE — Progress Notes (Signed)
 Video call completed with patient and her daughter Shona; charity fundraiser Elouise was present in the home as well. Introduced myself as the [name] FUNDRAISER for the shift. Reminded patient that I can be reached via phone or tablet throughout the night. Patient could be found sitting on the couch while receiving her IV antibiotic. Patient denies any pain at this time and states that the pain is worse when she's up walking or putting pressure on her leg. Remaining medications will be administered by [employer]. All questions were answered before ending the call.

## 2024-01-07 NOTE — Plan of Care (Signed)
 HOSPITAL AT HOME PROGRESS NOTE                                                                                                                                                                                                             Patient Demographics:    Briana Ochoa, is a 83 y.o. female, DOB - 28-Jul-1940, FMW:[id number]  Outpatient Primary MD for the patient is Nche, Roselie Rockford, NP    LOS - 1  Admit date - 01/05/2024    Chief Complaint  Patient presents with   Leg Pain       Brief Narrative (HPI from H&P)  Patient is a 83 year old female with history of multiple cancers including melanoma, breast cancer, bladder cancer, colon cancer, non-small cell lung cancer currently on treatment presented with left lower extremity pain, edema, erythema.  She has been treated with doxycycline  twice as an outpatient.  Since the cellulitis is not improving, she presented to the emergency department.  On presentation, she was afebrile did not have any leukocytosis.  Lab work showed creatinine of 1.7.  Patient admitted for further management of leg cellulitis, AKI on CKD. She has been considered for hospital at home today    Subjective:    Briana Ochoa was evaluated at the bed side. Patient seen and examined at bedside today.  Sitting on the chair.  Afebrile.  Still very concerned about her left lower extremity cellulitis.  Left lower extremity remains erythematous and mildly edematous. Does not appear to be worsening.  We discussed about continuing current antibiotics and following up on the venous Doppler report.    The hospital at home program was introduced to patient at the bedside and her daughter, Shona [phone]), over the phone on 01/06/24. They verbally agreed and patient signed consent form at the bedside the same day. Pt was transferred to the program on 01/07/24.    Assessment  & Plan :    Assessment and  Plan: Left lower extremity cellulitis: Presented with erythema, edema, pain of the left lower extremity.  Was treated with 2 courses of doxycycline . Started on ceftriaxone  and vancomycin . Bcx remains negative after 2 days.  Leukocytosis persists but she is afebrile. Lactate was mild elevated on admission in the range of 2. Continues to complain of left lower extremity discomfort. Left lower extremity appears edematous,erythematous.  Vancomycin   changed to Zyvox on 11/30   Elevated D-dimer Hx of LE DVT:  Outpatient ultrasound on 11/19 was negative for DVT. Repeat vascular ultrasound of the lower extremities again negative for DVT.    History of non-small cell lung cancer: Currently on chemotherapy and follows with oncology. Also has history of colon cancer, breast cancer: Currently on remission   Diabetes type 2: Well-controlled. A1c 7.6% on admission. CBGs stable in the 90s-140s.    AKI on CKD stage IIIb, resolved: Baseline creatinine 1.4.  Given IV fluids. Kidney function back to baseline with creatinine down to 1.28.    Hypertension: Stable. On lisinopril    B12 deficiency Vitamin B12 levels elevated to 1,107. Patient does get B12 injections  Hyperlipidemia: Continue pravastatin   Debility/deconditioning: .Patient lives with her daughter.  She is ambulatory at baseline when she is good. PT recommended home health      Nutrition Problem:       Class 1 Obesity: Estimated body mass index is 32.96 kg/m as calculated from the following:   Height as of this encounter: 5' 4 (1.626 m).   Weight as of this encounter: 87.1 kg.          Condition - Stable  Family Communication  :  Discussed transfer to Hospital at Home with daughter, Shona on 11/29 and 11/30.   Code Status :  Full Code  Consults  :  None  PUD Prophylaxis : N/A   Procedures  :     None      Disposition Plan  :    Status is: Inpatient Remains inpatient appropriate because: IV antibiotics for  cellulitis  DVT Prophylaxis  :  TED Hose     Lab Results  Component Value Date   PLT 223 01/07/2024    Diet :  Diet Order             Diet heart healthy/carb modified Room service appropriate? Yes; Fluid consistency: Thin  Diet effective now                    Inpatient Medications  Scheduled Meds:  acetaminophen   1,000 mg Oral Q8H   aspirin  EC  81 mg Oral Daily   folic acid   1 mg Oral Daily   lisinopril   20 mg Oral Daily   pravastatin   20 mg Oral QPM   Continuous Infusions:  cefTRIAXone  (ROCEPHIN )  IV 1 g (01/07/24 0804)   [START ON 01/08/2024] linezolid (ZYVOX) IV     PRN Meds:.guaiFENesin-dextromethorphan, ipratropium-albuterol, ondansetron , polyethylene glycol, sodium chloride   Antibiotics  :    Anti-infectives (From admission, onward)    Start     Dose/Rate Route Frequency Ordered Stop   01/08/24 1000  linezolid (ZYVOX) IVPB 600 mg        600 mg 300 mL/hr over 60 Minutes Intravenous Every 12 hours 01/07/24 1155     01/06/24 1600  vancomycin (VANCOCIN) IVPB 1000 mg/200 mL premix  Status:  Discontinued        1,000 mg 200 mL/hr over 60 Minutes Intravenous Every 48 hours 01/05/24 1736 01/07/24 1155   01/06/24 1000  cefTRIAXone  (ROCEPHIN ) 1 g in sodium chloride  0.9 % 100 mL IVPB        1 g 200 mL/hr over 30 Minutes Intravenous Every 24 hours 01/05/24 1642     01/06/24 0000  vancomycin (VANCOCIN) IVPB 1000 mg/200 mL premix  Status:  Discontinued        1,000 mg 200 mL/hr over 60 Minutes Intravenous  Once 01/05/24 1642 01/05/24 1650   01/05/24 1600  vancomycin  (VANCOREADY) IVPB 1500 mg/300 mL        1,500 mg 150 mL/hr over 120 Minutes Intravenous  Once 01/05/24 1553 01/05/24 1813   01/05/24 1545  cefTRIAXone  (ROCEPHIN ) 2 g in sodium chloride  0.9 % 100 mL IVPB        2 g 200 mL/hr over 30 Minutes Intravenous  Once 01/05/24 1544 01/05/24 1641         Objective:   Vitals:   01/06/24 0529 01/06/24 1350 01/06/24 2007 01/07/24 0640  BP: (!) 161/69 (!)  123/91 (!) 152/66 131/73  Pulse: 84 85 89 87  Resp: 18 18 16 12   Temp: 99.6 F (37.6 C) 98.8 F (37.1 C) 98.5 F (36.9 C) (!) 97.4 F (36.3 C)  TempSrc: Oral Oral Oral Oral  SpO2: 93% 95% 95% 94%  Weight:      Height:        Wt Readings from Last 3 Encounters:  01/05/24 87.1 kg  12/26/23 87.1 kg  12/25/23 87.4 kg     Intake/Output Summary (Last 24 hours) at 01/07/2024 1242 Last data filed at 01/07/2024 9095 Gross per 24 hour  Intake 360 ml  Output 400 ml  Net -40 ml     Physical Exam  General exam: Overall comfortable, not in distress, pleasant elderly female HEENT: PERRL Respiratory system:  no wheezes or crackles  Cardiovascular system: S1 & S2 heard, RRR.  Gastrointestinal system: Abdomen is nondistended, soft and nontender. Central nervous system: Alert and oriented Extremities: Left lower extremity edema, erythema Skin: No rashes, no ulcers,no icterus      RN pressure injury documentation:      Data Review:    Recent Labs  Lab 01/05/24 1503 01/06/24 0535 01/07/24 0645  WBC 9.0 9.9 12.5*  HGB 12.5 10.8* 11.3*  HCT 38.6 32.9* 35.1*  PLT 167 184 223  MCV 96.7 95.6 96.7  MCH 31.3 31.4 31.1  MCHC 32.4 32.8 32.2  RDW 15.0 15.0 14.9  LYMPHSABS 0.7  --   --   MONOABS 2.0*  --   --   EOSABS 0.0  --   --   BASOSABS 0.0  --   --     Recent Labs  Lab 01/05/24 1503 01/05/24 1515 01/05/24 1710 01/06/24 0535 01/07/24 0645  NA 135  --   --  135 133*  K 5.1  --   --  4.8 4.8  CL 101  --   --  103 102  CO2 23  --   --  22 20*  ANIONGAP 11  --   --  11 12  GLUCOSE 142*  --   --  149* 148*  BUN 36*  --   --  32* 26*  CREATININE 1.71*  --   --  1.49* 1.28*  AST 60*  --   --   --   --   ALT 27  --   --   --   --   ALKPHOS 141*  --   --   --   --   BILITOT 0.6  --   --   --   --   ALBUMIN  3.3*  --   --   --   --   DDIMER  --   --  3.70*  --   --   LATICACIDVEN  --  2.0*  --   --   --   TSH  --   --  1.360  --   --   HGBA1C 7.6*  --   --   --    --   CALCIUM  9.7  --   --  9.2 9.8      Recent Labs  Lab 01/05/24 1503 01/05/24 1515 01/05/24 1710 01/06/24 0535 01/07/24 0645  DDIMER  --   --  3.70*  --   --   LATICACIDVEN  --  2.0*  --   --   --   TSH  --   --  1.360  --   --   HGBA1C 7.6*  --   --   --   --   CALCIUM  9.7  --   --  9.2 9.8    --------------------------------------------------------------------------------------------------------------- Lab Results  Component Value Date   CHOL 182 06/01/2021   HDL 71.60 06/01/2021   LDLCALC 92 06/01/2021   LDLDIRECT 81.0 07/11/2023   TRIG 95.0 06/01/2021   CHOLHDL 3 06/01/2021    Lab Results  Component Value Date   HGBA1C 7.6 (H) 01/05/2024   Recent Labs    01/05/24 1710  TSH 1.360   Recent Labs    01/05/24 1710  VITAMINB12 1,107*   ------------------------------------------------------------------------------------------------------------------ Cardiac Enzymes No results for input(s): CKMB, TROPONINI, MYOGLOBIN in the last 168 hours.  Invalid input(s): CK  Micro Results Recent Results (from the past 240 hours)  Blood culture (routine x 2)     Status: None (Preliminary result)   Collection Time: 01/05/24  3:03 PM   Specimen: BLOOD LEFT ARM  Result Value Ref Range Status   Specimen Description   Final    BLOOD LEFT ARM Performed at [facility], [address] [place], [place], [place] [address]    Special Requests   Final    BOTTLES DRAWN AEROBIC AND ANAEROBIC Blood Culture results may not be optimal due to an inadequate volume of blood received in culture bottles Performed at [facility], [address] [place], [place] [address]    Culture   Final    NO GROWTH 2 DAYS Performed at [facility], [address], [place], [place] [address]    Report Status PENDING  Incomplete  Resp panel by RT-PCR (RSV, Flu A&B, Covid) Anterior Nasal Swab     Status: None   Collection Time: 01/05/24  3:14 PM   Specimen: Anterior  Nasal Swab  Result Value Ref Range Status   SARS Coronavirus 2 by RT PCR NEGATIVE NEGATIVE Final    Comment: (NOTE) SARS-CoV-2 target nucleic acids are NOT DETECTED.  The SARS-CoV-2 RNA is generally detectable in upper respiratory specimens during the acute phase of infection. The lowest concentration of SARS-CoV-2 viral copies this assay can detect is 138 copies/mL. A negative result does not preclude SARS-Cov-2 infection and should not be used as the sole basis for treatment or other patient management decisions. A negative result may occur with  improper specimen collection/handling, submission of specimen other than nasopharyngeal swab, presence of viral mutation(s) within the areas targeted by this assay, and inadequate number of viral copies(<138 copies/mL). A negative result must be combined with clinical observations, patient history, and epidemiological information. The expected result is Negative.  Fact Sheet for Patients:  bloggercourse.com  Fact Sheet for Healthcare Providers:  seriousbroker.it  This test is no t yet approved or cleared by the [place]  FDA and  has been authorized for detection and/or diagnosis of SARS-CoV-2 by FDA under an Emergency Use Authorization (EUA). This EUA will remain  in effect (meaning this test can be used) for the duration of the COVID-19 declaration under Section 564(b)(1) of the Act, 21 U.S.C.section 360bbb-3(b)(1), unless the authorization is terminated  or revoked sooner.       Influenza A by PCR NEGATIVE NEGATIVE Final   Influenza B by PCR NEGATIVE NEGATIVE Final    Comment: (NOTE) The Xpert Xpress SARS-CoV-2/FLU/RSV plus assay is intended as an aid in the diagnosis of influenza from Nasopharyngeal swab specimens and should not be used as a sole basis for treatment. Nasal washings and aspirates are unacceptable for Xpert Xpress SARS-CoV-2/FLU/RSV testing.  Fact Sheet for  Patients: bloggercourse.com  Fact Sheet for Healthcare Providers: seriousbroker.it  This test is not yet approved or cleared by the [place]  FDA and has been authorized for detection and/or diagnosis of SARS-CoV-2 by FDA under an Emergency Use Authorization (EUA). This EUA will remain in effect (meaning this test can be used) for the duration of the COVID-19 declaration under Section 564(b)(1) of the Act, 21 U.S.C. section 360bbb-3(b)(1), unless the authorization is terminated or revoked.     Resp Syncytial Virus by PCR NEGATIVE NEGATIVE Final    Comment: (NOTE) Fact Sheet for Patients: bloggercourse.com  Fact Sheet for Healthcare Providers: seriousbroker.it  This test is not yet approved or cleared by the [place]  FDA and has been authorized for detection and/or diagnosis of SARS-CoV-2 by FDA under an Emergency Use Authorization (EUA). This EUA will remain in effect (meaning this test can be used) for the duration of the COVID-19 declaration under Section 564(b)(1) of the Act, 21 U.S.C. section 360bbb-3(b)(1), unless the authorization is terminated or revoked.  Performed at [facility], [address] [place], [place] [address]   Blood culture (routine x 2)     Status: None (Preliminary result)   Collection Time: 01/05/24  5:19 PM   Specimen: BLOOD RIGHT HAND  Result Value Ref Range Status   Specimen Description   Final    BLOOD RIGHT HAND Performed at [facility], [address], [place], [place] [address]    Special Requests   Final    BOTTLES DRAWN AEROBIC ONLY Blood Culture adequate volume Performed at [facility], [address], [place], [place] [address]    Culture   Final    NO GROWTH 2 DAYS Performed at [facility], [address] [place], [place] [address]    Report Status PENDING  Incomplete     Radiology Reports VAS US  LOWER EXTREMITY VENOUS (DVT) Result Date: 01/07/2024  Lower Venous DVT Study Patient Name:  EMBRIE MIKKELSEN  Date of Exam:   01/07/2024 Medical Rec #: [id number]            Accession #:    [id number] Date of Birth: 05/18/1940             Patient Gender: F Patient Age:   23 years Exam [facility]  [facility] Procedure:      VAS US  LOWER EXTREMITY VENOUS (DVT) Referring Phys: Ciearra Rufo --------------------------------------------------------------------------------  Indications: Pain.  Risk Factors: None identified. Limitations: Poor ultrasound/tissue interface and patient pain tolerance. Comparison Study: No prior studies. Performing Technologist: Cordella Collet RVT  Examination Guidelines: A complete evaluation includes B-mode imaging, spectral Doppler, color Doppler, and power Doppler as needed of all accessible portions of each vessel. Bilateral testing is considered an integral part of a complete examination. Limited examinations for reoccurring indications may be performed as noted. The reflux portion  of the exam is performed with the patient in reverse Trendelenburg.  +-----+---------------+---------+-----------+----------+--------------+ RIGHTCompressibilityPhasicitySpontaneityPropertiesThrombus Aging +-----+---------------+---------+-----------+----------+--------------+ CFV  Full           Yes      Yes                                 +-----+---------------+---------+-----------+----------+--------------+   +---------+---------------+---------+-----------+----------+-------------------+ LEFT     CompressibilityPhasicitySpontaneityPropertiesThrombus Aging      +---------+---------------+---------+-----------+----------+-------------------+ CFV      Full           Yes      Yes                                      +---------+---------------+---------+-----------+----------+-------------------+ SFJ      Full                                                              +---------+---------------+---------+-----------+----------+-------------------+ FV Prox  Full                                                             +---------+---------------+---------+-----------+----------+-------------------+ FV Mid   Full                                                             +---------+---------------+---------+-----------+----------+-------------------+ FV DistalFull                                                             +---------+---------------+---------+-----------+----------+-------------------+ PFV      Full                                                             +---------+---------------+---------+-----------+----------+-------------------+ POP      Full           Yes      Yes                                      +---------+---------------+---------+-----------+----------+-------------------+ PTV      Full                                                             +---------+---------------+---------+-----------+----------+-------------------+  PERO                                                  Not well visualized +---------+---------------+---------+-----------+----------+-------------------+    Summary: RIGHT: - No evidence of common femoral vein obstruction.   LEFT: - There is no evidence of deep vein thrombosis in the lower extremity. However, portions of this examination were limited- see technologist comments above.  - No cystic structure found in the popliteal fossa.  *See table(s) above for measurements and observations. Electronically signed by Debby Robertson on 01/07/2024 at 11:06:57 AM.    Final     Hospital at Home Admission Criteria Checklist:  Formal consent explained in detail and signed at the bedside: yes Patient meets inpatient admission criteria (see below for further details) yes Is pt Medicare FFS/[employer] Medicare-Medicaid, [employer], [employer] (  required for initial launch with plan to expand)? yes Lives within 25 mil/ 30 min from [facility] within [place](pt may stay with family member during admission who lives within 25 miles or 30 min from [facility]in [facility])? yes Hemodynamically stable with relatively low risk of clinical deterioration-not requiring ICU? yes Age >55? yes Does not require frequent touch-points or complex interventions/medications (ie Titrated Infusions (IV insulin , heparin  drips, vasoactive drips, use of infused or injectable controlled substances, patients on insulin )? yes Any Behavioral Health comorbidities likely to increase risk for in-home care (ie Acute delirium or experiencing a marked altered mental status and cause is not a treatable condition in the home)? no Has the patient been on BIPAP during course of ED evaluation or hospitalization? no IF YES, Has the patient been off of BIPAP for >24 hours(If NO-THEN PATIENT DOES MEET INCLUSION CRITERIA)? not applicable On Room Air or Needs oxygen at home (<6L)? is not on home oxygen therapy. Active safety concerns (ie Unable to use bedside commode independently and lacks caregiver support for safety- needs SNF placement, unable to obtain IV access)? no Has skin check been performed? yes  Has Physical Therapy screened the patient? yes  Common admission diagnoses including: CAP, COPD Exacerbation, Acute on chronic heart failure, Cellulitis, UTI , dehydration, acute resp failure with hypoxia (requiring <5L)   Social Screening:  - Has the family been directly contacted about Hospital at Home program with consent obtained (if yes- please document who was spoken to with name and phone number)? yes  -Was the family approached about the use of TOC pharmacy for medications at discharge? yes Denies significant ETOH intake? yes Does not smoke and understands may not smoke in the presence of oxygen? yes Patient states able to use iPad/phone for communication/has family  who is able to use? yes Patient has agreed to be compliant with medication and treatment regimen of the program? yes Any active drug use in patient or primary caregiver including daily dosing of methadone? no Stable home environment ( access to appropriate heating in cold conditions and/or appropriate air conditioning in hot conditions and/or no running water/electricity)? yes  No aggressive pets at home? no Firearm present? no  With ability or willingness to store them unloaded in a locked case for duration of hospitalization? yes Ambulatory? yes  no difficulty Bed bugs present on home evaluation? no Family support system in place? yes Patient feels safe at home and does not endorse any violence? yes Any actively decompensated behavioral health issues including  agitation/aggressive behavior? no  Patient requests food to be provided by hospital home program? no PT/OT eval completed and not requiring SNF, ALF, inpatient rehab? yes  To be admitted to the Hospital at [facility] program, a patient generally must meet the following: 1. Requirement for Inpatient Level of Care: The patient's condition must necessitate an inpatient level of care. This is typically indicated by one or more of the following, depending on their specific diagnosis:  Persistent tachycardia despite appropriate treatment (e.g., for Heart Failure, UTI). Persistent tachypnea (rapid breathing) or dyspnea (shortness of breath) that hasn't improved sufficiently with observation care (e.g., for Heart Failure, Pneumonia, Viral Illness, COVID). Hypoxemia (low oxygen levels), such as a new need for oxygen, an increased need from baseline, or specific oxygen saturation levels (e.g., SpO2 <90-94% depending on the condition) that persist despite observation (e.g., for Heart Failure, COPD, Pneumonia, Viral Illness, COVID). Need for Intravenous (IV) hydration due to an inability to maintain oral hydration, which persists despite observation care  (e.g., for Cellulitis, UTI, Viral Illness, COVID). Specific to Heart Failure: Persistent pulmonary edema, indicated by a new oxygen need, lack of improvement with IV diuretics, and ongoing tachypnea/dyspnea. Specific to COPD: A decrease in known baseline resting oxygen saturation (SpO2) by 4% or more, or an increase in pre-existing supplemental oxygen requirements, which persists despite observation and requires continued close monitoring. Specific to Pneumonia: A Pneumonia Severity Index (PSI) class IV (moderate risk). Specific to Cellulitis: Failure of outpatient antibiotic therapy (indicated by progression or no improvement after a minimum of 48 hours on an adequate regimen) or a clinical presentation (like acuity or rapidity of progression) that requires the intensity of monitoring found in an inpatient setting. Specific to UTI: Persistence or worsening of clinical findings like fever, pain, or dehydration despite observation care; presence of significant uropathy; suspected infection of an indwelling prosthetic device, stent, implant, or graft; or pregnancy with suspected pyelonephritis.  2. Appropriateness for Hospital at Home Setting: The patient's overall clinical picture, including the severity of their illness, their care needs, and their medical history and comorbidities, must be suitable for management in the Hospital at Home environment. This essentially means that none of the exclusion criteria (listed below) are met.  Unified Exclusion Criteria for Hospital at Home Admission: A patient would not be eligible for Hospital at Home if any of the following are present: Hemodynamic Instability: Hypotension (low blood pressure) is present. Respiratory Instability or Needs Beyond Program Capability: There is a new need for invasive or noninvasive ventilatory assistance (like BiPAP or a ventilator). Oxygenation is not sufficient, generally indicated if an FiO2 (fraction of inspired oxygen) of  45% (which is about 6 Liters/minute via nasal cannula) or more is required to keep oxygen saturation (SpO2) at 90% or greater. Monitoring or Procedural Needs Beyond Program Capability: There is a need for invasive monitoring, such as a pulmonary artery catheter or an arterial line. There is a need for immediate-response telemetry monitoring (for dangerous arrhythmia detection and subsequent immediate intervention). The required medication regimen is beyond the capabilities of Hospital at Home (e.g., dosing intervals are too frequent for home administration). There is a need for a procedure that cannot be performed by the [facility] at [facility] team (e.g., significant wound debridement or abscess drainage for cellulitis, or percutaneous nephrostomy for a complicated UTI). Significant Organ Dysfunction or Markers of Severe Illness: Mental status is not at baseline, or there is altered mental status suggestive of inadequate perfusion. Renal (kidney) function is unstable or showing an ongoing  decline. There is evidence of inadequate perfusion, such as metabolic acidosis or myocardial ischemia. Uncompensated acidosis is present. Condition-Specific Severity or Complications Making Home Care Unsuitable: For Heart Failure: Known severe cardiac valvular disease (e.g., aortic stenosis, mitral regurgitation); or severe peripheral edema that impairs the ability to urinate or ambulate. For COPD: Known concurrent comorbidity or finding that indicates a higher-risk COPD exacerbation (e.g., pulmonary fibrosis, cavitation, pleural effusion, pneumothorax, rib fracture). For Pneumonia: Pneumonia Severity Index (PSI) class V (indicating high risk for inpatient mortality); known concurrent comorbidity or finding that indicates higher-risk pneumonia (e.g., pulmonary fibrosis, cavitation, large or loculated pleural effusion); or a concomitant serious infectious process like endocarditis or empyema. For Cellulitis: Orbital,  periorbital, or necrotizing infection is suspected; or a concomitant serious infectious process like endocarditis, septic emboli, or septic joint space infection. For UTI: Urinary tract obstruction (e.g., kidney stone, bladder outlet obstruction); or a concomitant serious infectious process like endocarditis or septic emboli. For Viral Illness & COVID-19: A concomitant serious infectious process like endocarditis or empyema.  General Comorbidities or Status:  The patient is significantly immunosuppressed (this applies to Pneumonia, Cellulitis, UTI, Viral Illness, and COVID-19). The patient meets inpatient admission criteria for a second diagnosis, or has care needs beyond the capabilities of Hospital [facility] due to an active clinically significant comorbidity. (This is a general exclusion across all listed conditions)   Signature  -   Claretta CHRISTELLA Alderman M.D on 01/07/2024 at 12:42 PM   -  To page go to [email or web address]

## 2024-01-07 NOTE — Progress Notes (Signed)
 Pt admitted to [facility] program. Pt was transported by Gaylan, EMT via wheelchair fleeta and assisted inside residence. Pt's daughter is present.  Home safety assessment completed. Pt's home is clean and free of clutter. All walkways are clear and well lit. Pt's bathroom does not have any hazards. Pt's shower is a walk in shower with approximately 2-4 inch lip and has a shower seat as well as a grab bar. Pt feels safe using same. I told Pt to take it slowly and if she feels weak at all, not to attempt using shower. Pt acknowledges and understands. Pt has 3 friendly dogs and 2 cats.   Med rec completed with Darice, [name] [employer]. Photos uploaded of pt's home RX nausea medication for pharmacy verification in case it is non-formulary.   2 person skin assessment completed prior to transporting Pt while still at hospital.  All current health equipment set up and Faith explained how to make and receive calls on tablet with Pt and Pt's daughter. They were also given hub phone number and encouraged to call with any problems at all or if they are having trouble using tablet for any reason.   Medications set up in bins and med administration process explained to Pt and her daughter. They verbalize understanding not to take anything without checking with virtual RN first.   Vital signs and assessment obtained as noted.  Pt unable to tolerate ted hose that were ordered. When I attempted to slide the beginning part over her feet she states it feels very tender to the touch. Pt was able to put on a knee high regular sock and encouraged to elevate feet to help with swelling. I left ted hose at her house in case she is able to put them on in the next few days.  Welcome binder reviewed with Pt. Pt informed Shawn, Medic, will be back around 8pm for IV abx and to bring nebulizer machine. Pt encouraged to call with any problems or questions until then.

## 2024-01-08 DIAGNOSIS — C3490 Malignant neoplasm of unspecified part of unspecified bronchus or lung: Secondary | ICD-10-CM

## 2024-01-08 DIAGNOSIS — I129 Hypertensive chronic kidney disease with stage 1 through stage 4 chronic kidney disease, or unspecified chronic kidney disease: Secondary | ICD-10-CM

## 2024-01-08 DIAGNOSIS — C7951 Secondary malignant neoplasm of bone: Secondary | ICD-10-CM

## 2024-01-08 LAB — CBC
HCT: 34.2 % — ABNORMAL LOW (ref 36.0–46.0)
Hemoglobin: 11.1 g/dL — ABNORMAL LOW (ref 12.0–15.0)
MCH: 31 pg (ref 26.0–34.0)
MCHC: 32.5 g/dL (ref 30.0–36.0)
MCV: 95.5 fL (ref 80.0–100.0)
Platelets: 268 K/uL (ref 150–400)
RBC: 3.58 MIL/uL — ABNORMAL LOW (ref 3.87–5.11)
RDW: 14.8 % (ref 11.5–15.5)
WBC: 12.1 K/uL — ABNORMAL HIGH (ref 4.0–10.5)
nRBC: 0.5 % — ABNORMAL HIGH (ref 0.0–0.2)

## 2024-01-08 LAB — BASIC METABOLIC PANEL WITH GFR
Anion gap: 14 (ref 5–15)
BUN: 25 mg/dL — ABNORMAL HIGH (ref 8–23)
CO2: 24 mmol/L (ref 22–32)
Calcium: 9 mg/dL (ref 8.9–10.3)
Chloride: 98 mmol/L (ref 98–111)
Creatinine, Ser: 1.52 mg/dL — ABNORMAL HIGH (ref 0.44–1.00)
GFR, Estimated: 34 mL/min — ABNORMAL LOW (ref >60)
Glucose, Bld: 190 mg/dL — ABNORMAL HIGH (ref 70–99)
Potassium: 4.4 mmol/L (ref 3.5–5.1)
Sodium: 136 mmol/L (ref 135–145)

## 2024-01-08 MED ORDER — LINEZOLID 600 MG PO TABS
600.0000 mg | ORAL_TABLET | Freq: Two times a day (BID) | ORAL | Status: DC
  Administered 2024-01-08 – 2024-01-11 (×6): 600 mg via ORAL
  Filled 2024-01-08 (×8): qty 1

## 2024-01-08 MED ORDER — LOPERAMIDE HCL 2 MG PO CAPS
2.0000 mg | ORAL_CAPSULE | ORAL | Status: DC | PRN
  Administered 2024-01-08 – 2024-01-10 (×4): 2 mg via ORAL
  Filled 2024-01-08 (×16): qty 1

## 2024-01-08 NOTE — Progress Notes (Addendum)
 Patient reports nausea. PRN zofran  given. No acute distress noted. Patient stated she had two episodes of loose stools this morning, and is requesting imodium. Dr. Fausto made aware. Plan for paramedic visit between [phone] today.

## 2024-01-08 NOTE — Progress Notes (Signed)
 Hospital [facility] -- Daily Progress Note   Patient: Briana Ochoa  MRN: [id number]  DOB: 1940-08-04  DOA: 01/05/2024  DOS: the patient was seen and examined on @TODAY @    Patient identified themself as Briana Ochoa  DOB 05/12/1940  Medic Lauraine Faes present in the home during encounter and performed the assessment and physical exam. Patient was seen today via video conference; my physical location [place], [place]   CC: left leg pain swelling and redness  HPI & brief hospital course:  Patient is a 83 year old with history of multiple primary cancers to include melanoma, breast cancer, bladder cancer, colon cancer, now undergoing treatment for non-small cell lung cancer who has left lower extremity presenting cellulitis.  This has been treated by Doxy x 2 both times has gotten somewhat better but then recurs.  The patient reports several day history of increasing swelling edema, erythema, the patient has previously had 2 negative DVT studies in the past.  In the ED she was noted to have no significant white count, no fever, some elevation of her AST and alk phos as well as a acute on chronic kidney injury.  We were asked to admit for failed outpatient therapy.  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 (HCC) 11/20/2023   Chronic bronchitis, unspecified chronic bronchitis type (HCC) 07/11/2023   Pulmonary nodules 04/02/2021   History of breast cancer 04/02/2021   History of bladder cancer 04/02/2021   History of colon cancer 04/02/2021   History of tobacco use 04/02/2021   B12 deficiency 03/27/2021   Diabetic retinopathy (HCC) 12/03/2020   Genetic testing 07/30/2020   Family history of kidney cancer    Family history of breast cancer    Family history of uterine cancer    Family history of throat cancer    Cancer of left colon (HCC) 05/28/2020   History of DVT of lower extremity 04/16/2020   Iron deficiency anemia due to chronic blood loss 04/09/2020   Hyperlipidemia associated with type 2 diabetes mellitus (HCC) 04/09/2020   Positive D dimer 04/08/2020   Symptomatic anemia 04/08/2020   Diabetes mellitus (HCC) 11/19/2018   Bladder cancer (HCC) 02/09/2018   Weakness 09/28/2017   Hematuria 09/28/2017   DOE (dyspnea on exertion) 09/28/2017   Malignant melanoma of lower leg, right (HCC) 12/28/2015   Cerebral arterial aneurysm 09/16/2013  Right internal carotid artery aneurysm 08/21/2013   Meningioma, multiple (HCC) 08/21/2013   Stage 3b chronic kidney disease (HCC) 07/17/2013   History of tobacco abuse 07/17/2013   HTN (hypertension) 04/16/2013   OA (osteoarthritis) of knee 04/16/2013      Significant Events: Admitted 01/05/2024    Subjective / Interval 24 hour History:  ***     Admission  Labs:   Admission Imaging Studies:   Significant Labs:   Significant Imaging Studies:   Antibiotic Therapy: Anti-infectives (From admission, onward)    Start     Dose/Rate Route Frequency Ordered Stop   01/08/24 1000  linezolid (ZYVOX) IVPB 600 mg  Status:  Discontinued        600 mg 300 mL/hr over 60 Minutes Intravenous Every 12 hours 01/07/24 1155 01/07/24 1243   01/07/24 1330  linezolid (ZYVOX) IVPB 600 mg        600 mg 300 mL/hr over 60 Minutes Intravenous Every 12 hours 01/07/24 1243     01/06/24 1600  vancomycin (VANCOCIN) IVPB 1000 mg/200 mL premix  Status:  Discontinued        1,000 mg 200 mL/hr over 60 Minutes Intravenous Every 48 hours 01/05/24 1736 01/07/24 1155   01/06/24 1000  cefTRIAXone  (ROCEPHIN ) 1 g in sodium chloride  0.9 % 100 mL IVPB        1 g 200 mL/hr over 30 Minutes Intravenous Every 24 hours 01/05/24 1642     01/06/24 0000  vancomycin (VANCOCIN) IVPB 1000 mg/200 mL premix  Status:  Discontinued        1,000 mg 200 mL/hr over 60 Minutes Intravenous  Once 01/05/24 1642 01/05/24 1650   01/05/24 1600  vancomycin (VANCOREADY) IVPB 1500 mg/300 mL        1,500 mg 150 mL/hr over 120 Minutes Intravenous  Once 01/05/24 1553 01/05/24 1813   01/05/24 1545  cefTRIAXone  (ROCEPHIN ) 2 g in sodium chloride  0.9 % 100 mL IVPB        2 g 200 mL/hr over 30 Minutes Intravenous  Once 01/05/24 1544 01/05/24 1641       Procedures:   Consultants:        {Tip this will not be part of the note when signed Body mass index is 34.67 kg/m. , ,  (Optional):26781}   Physical Exam:    01/08/2024   11:00 AM 01/07/2024    8:00 PM 01/07/2024    3:53 PM  Vitals with BMI  Weight   192 lbs 10 oz  BMI   33.04  Systolic 150 143 855  Diastolic 79 79 78  Pulse 90 86 83      ***   Data Reviewed: {Tip this will not be part of the note when signed- Document your independent interpretation of telemetry tracing, EKG, lab, Radiology test or any other diagnostic tests.  Add any new diagnostic test ordered today. (Optional):26781} {Results:26384}   Family Communication: ***  Disposition: Status is: Inpatient {Inpatient:23812}  Planned Discharge Destination: {DISCHARGE DESTINATION_TRH:27031} {Tip this will not be part of the note when signed  DVT Prophylaxis  .Apixaban  (eliquis ) tablet 5 mg , Place ted hose  (Optional):26781}   Time spent: *** minutes  Author: Burnard DELENA Cunning, DO [facility]  09/28/2023 7:00 AM  For on call review [email or web address].

## 2024-01-08 NOTE — Progress Notes (Signed)
 Pt seen for routine [facility] visit. Pt appears well and is sitting at kitchen table. Pt's daughter is present. Pt has no complaints and her daughter states she feels Pt's leg looks better this morning.  Pt's left leg still appears erythematous and flaky but swelling appears improved. Pt's lower leg and ankle are still very tender to touch. Left leg feels slightly warmer than the right leg but not hot.   Pt's lungs are clear in upper fields and diminished in lower fields upon auscultation. Pt states she feels SOB when she walks anywhere but is able to recover quickly once she sits down. Pt did get SOB during visit after using the bathroom but never began wheezing and SOB subsided quickly after she sat down.  Medications administered as noted. IV care completed and new curos cap added.   Labs drawn and were transported by Nat, EMT to [facility] lab.  Pt had virtual visit with Dr. Fausto and virtual RN, plan of care discussed. Pt's daughter present for encounter.  Pt encouraged to call with any problems or questions and informed I will be back this afternoon.

## 2024-01-08 NOTE — TOC Progression Note (Signed)
 Transition of Care [facility]) - Progression Note    Patient Details  Name: Briana Ochoa MRN: [id number] Date of Birth: 1940/10/12  Transition of Care [facility]) CM/SW Contact  Rosalva Jon Bloch, RN Phone Number: 01/08/2024, 1:09 PM  Clinical Narrative:    NCM spoke with pt regarding therapy recommendation for home health PT services. Pt agreeable. Pt without provider preference as long as in network with insurance. NCM faxed out home health PT referral via HUB, awaiting acceptances.  Inpatient CM following and will contue assisting with needs...  Expected Discharge Plan: Home w Home Health Services Barriers to Discharge: Continued Medical Work up               Expected Discharge Plan and Services   Discharge Planning Services: CM Consult Post Acute Care Choice: Home Health Living arrangements for the past 2 months: Single Family Home                 DME Arranged: N/A DME Agency: NA       HH Arranged: NA HH Agency: NA         Social Drivers of Health (SDOH) Interventions SDOH Screenings   Food Insecurity: No Food Insecurity (01/06/2024)  Housing: Low Risk  (01/06/2024)  Transportation Needs: No Transportation Needs (01/06/2024)  Utilities: Not At Risk (01/06/2024)  Depression (PHQ2-9): Low Risk  (12/04/2023)  Physical Activity: Unknown (04/27/2018)  Social Connections: Moderately Integrated (01/05/2024)  Stress: No Stress Concern Present (04/27/2018)  Tobacco Use: Medium Risk (01/05/2024)    Readmission Risk Interventions     No data to display

## 2024-01-08 NOTE — Plan of Care (Signed)

## 2024-01-09 LAB — LACTIC ACID, PLASMA: Lactic Acid, Venous: 1.2 mmol/L (ref 0.5–1.9)

## 2024-01-09 LAB — BASIC METABOLIC PANEL WITH GFR
Anion gap: 7 (ref 5–15)
BUN: 26 mg/dL — ABNORMAL HIGH (ref 8–23)
CO2: 24 mmol/L (ref 22–32)
Calcium: 8.6 mg/dL — ABNORMAL LOW (ref 8.9–10.3)
Chloride: 105 mmol/L (ref 98–111)
Creatinine, Ser: 1.48 mg/dL — ABNORMAL HIGH (ref 0.44–1.00)
GFR, Estimated: 35 mL/min — ABNORMAL LOW (ref >60)
Glucose, Bld: 136 mg/dL — ABNORMAL HIGH (ref 70–99)
Potassium: 4.8 mmol/L (ref 3.5–5.1)
Sodium: 136 mmol/L (ref 135–145)

## 2024-01-09 LAB — CBC
HCT: 31.9 % — ABNORMAL LOW (ref 36.0–46.0)
Hemoglobin: 10.3 g/dL — ABNORMAL LOW (ref 12.0–15.0)
MCH: 30.7 pg (ref 26.0–34.0)
MCHC: 32.3 g/dL (ref 30.0–36.0)
MCV: 94.9 fL (ref 80.0–100.0)
Platelets: 363 K/uL (ref 150–400)
RBC: 3.36 MIL/uL — ABNORMAL LOW (ref 3.87–5.11)
RDW: 15.1 % (ref 11.5–15.5)
WBC: 11.4 K/uL — ABNORMAL HIGH (ref 4.0–10.5)
nRBC: 0.5 % — ABNORMAL HIGH (ref 0.0–0.2)

## 2024-01-09 MED ORDER — ACETAMINOPHEN 500 MG PO TABS
1000.0000 mg | ORAL_TABLET | Freq: Two times a day (BID) | ORAL | Status: DC
  Administered 2024-01-09 – 2024-01-11 (×4): 1000 mg via ORAL
  Filled 2024-01-09 (×6): qty 2

## 2024-01-09 MED ORDER — ACETAMINOPHEN 500 MG PO TABS
1000.0000 mg | ORAL_TABLET | Freq: Every day | ORAL | Status: DC | PRN
  Filled 2024-01-09: qty 2

## 2024-01-09 NOTE — Progress Notes (Signed)
 Evening visit with paramedic. RN virtual, via video. Pt's oxygen sats reading 90% on currrent health.Paramedic was able to compare to portable pulse ox and it was 93%. Pt's daughter at bedside and states patient has not been moving a lot in the past few weeks. Discussed importance of increasing mobility and also deep breathing. IS given to patient,instructed how to use and patient able to demonstrate use ,measuring 1250.  Pt also states she has a chemo appointment Monday and is asking if our MD can reach out to oncology to update on her condition and perhaps reschedule chemo .

## 2024-01-09 NOTE — Plan of Care (Addendum)
  Problem: Education: Goal: Knowledge of General Education information will improve Description: Including pain rating scale, medication(s)/side effects and non-pharmacologic comfort measures Outcome: Progressing   Problem: Health Behavior/Discharge Planning: Goal: Ability to manage health-related needs will improve Outcome: Progressing   Problem: Clinical Measurements: Goal: Ability to maintain clinical measurements within normal limits will improve Outcome: Progressing Goal: Will remain free from infection Outcome: Progressing Goal: Diagnostic test results will improve Outcome: Progressing Goal: Respiratory complications will improve Outcome: Progressing Goal: Cardiovascular complication will be avoided Outcome: Progressing   Problem: Activity: Goal: Risk for activity intolerance will decrease Outcome: Progressing   Problem: Nutrition: Goal: Adequate nutrition will be maintained Outcome: Progressing   Problem: Coping: Goal: Level of anxiety will decrease Outcome: Progressing   Problem: Elimination: Goal: Will not experience complications related to bowel motility Outcome: Progressing Goal: Will not experience complications related to urinary retention Outcome: Progressing   Problem: Pain Managment: Goal: General experience of comfort will improve and/or be controlled Outcome: Progressing   Problem: Safety: Goal: Ability to remain free from injury will improve Outcome: Progressing   Problem: Skin Integrity: Goal: Risk for impaired skin integrity will decrease Outcome: Progressing   Problem: Clinical Measurements: Goal: Ability to avoid or minimize complications of infection will improve Outcome: Progressing   Problem: Skin Integrity: Goal: Skin integrity will improve Outcome: Progressing   Problem: Education: Goal: Ability to describe self-care measures that may prevent or decrease complications (Diabetes Survival Skills Education) will improve Outcome:  Progressing Goal: Individualized Educational Video(s) Outcome: Progressing   Problem: Coping: Goal: Ability to adjust to condition or change in health will improve Outcome: Progressing   Problem: Fluid Volume: Goal: Ability to maintain a balanced intake and output will improve Outcome: Progressing   Problem: Health Behavior/Discharge Planning: Goal: Ability to identify and utilize available resources and services will improve Outcome: Progressing Goal: Ability to manage health-related needs will improve Outcome: Progressing

## 2024-01-09 NOTE — TOC Progression Note (Addendum)
 Transition of Care [facility]) - Progression Note    Patient Details  Name: Briana Ochoa MRN: [id number] Date of Birth: 1940/12/27  Transition of Care [facility]) CM/SW Contact  Rosalva Jon Bloch, RN Phone Number: 01/09/2024, 7:54 AM  Clinical Narrative:    [facility] accepted referral for home health PT services, start of care within 48 hours post discharge, pt to be made aware by NCM, and noted on AVS. Pt without DME needs.  Inpatient CM team following and will continue assisting with TOC needs...   Expected Discharge Plan: Home w Home Health Services Barriers to Discharge: Continued Medical Work up               Expected Discharge Plan and Services   Discharge Planning Services: CM Consult Post Acute Care Choice: Home Health Living arrangements for the past 2 months: Single Family Home                 DME Arranged: N/A DME Agency: NA       HH Arranged: PT HH Agency: [facility] Date [facility] Agency Contacted: 01/09/24 Time HH Agency Contacted: [phone] Representative spoke with at [facility] Agency: Darleene   Social Drivers of Health (SDOH) Interventions SDOH Screenings   Food Insecurity: No Food Insecurity (01/06/2024)  Housing: Low Risk  (01/06/2024)  Transportation Needs: No Transportation Needs (01/06/2024)  Utilities: Not At Risk (01/06/2024)  Depression (PHQ2-9): Low Risk  (12/04/2023)  Physical Activity: Unknown (04/27/2018)  Social Connections: Moderately Integrated (01/05/2024)  Stress: No Stress Concern Present (04/27/2018)  Tobacco Use: Medium Risk (01/05/2024)    Readmission Risk Interventions     No data to display

## 2024-01-09 NOTE — Progress Notes (Addendum)
 Completed virtual rounds with MD,paramedic at patient bedside. POC reviewed and discussed ,patient voices understanding and agreement. Pt reminded to call RN for any needs, RN and MD available at all times. Pt voices understanding. Pt aware of next planned visit and next call from RN.

## 2024-01-09 NOTE — Hospital Course (Addendum)
 Briana Ochoa is 83 year old with history of multiple primary cancers to include melanoma, breast cancer, bladder cancer, colon cancer, now undergoing treatment for non-small cell lung cancer who has left lower extremity presenting cellulitis.  This has been treated by Doxy x 2 both times has gotten somewhat better but then recurs.  The patient reports several day history of increasing swelling edema, erythema, the patient has previously had 2 negative DVT studies in the past.  In the ED she was noted to have no significant white count, no fever, some elevation of her AST and alk phos as well as a acute on chronic kidney injury.    Pt was admitted to the hospital on 01/05/24 for IV antibiotics for left lower extremity cellulitis requiring IV antibiotics, having failed outpatient therapy.  11/30 - transferred to [facility] program to continue IV antibiotics pending further improvement 12/1 - pt's left leg slowly improving, has persistent warmth, erythema with early receding, tenderness and pain ongoing.  Continue on IV Rocephin .  IV Zyvox >> PO Zyvox for evening and remainder of course. Day 4 antibiotics 12/2 - persistent LLE erythema, today extends up the posterior leg to above the knee, ongoing pain/tenderness.   Day 5 antibiotics: IV Rocephin , PO Zyvox  12/3: I assumed care of the patient. day 6 of IV abx. Increased loperamide to 4 mg BID as needed for diarrhea, loose stools. DME nebulizer machine ordered. Discussed diet to encourage healthy skin, colorful vegetables, fresh fruit, baked protein such as salmon, chicken breast. Discussed avoiding tobacco use, alcohol, and decreasing-eliminating sugar and/or processed food from her diet. Discussed keeping skin moisturized BID.  12/4: Day 7 of IV abx. sCr noted to be increased on prior day labs. NS 250 ml bolus ordered, recheck BMP.

## 2024-01-09 NOTE — Progress Notes (Signed)
 Writer was out to see patient this evening for second visit of the day. On arrival I was greeted at the door by the patients daughter who welcomed me in. On assessment, lung sounds were mildly diminished in the (L) and (R) lower lobes and clear throughout other lobes. Lung sounds were better than this afternoon and patient states that she feels much better tonight even several hours after the nebulizer tx early this afternoon with complaints now of mild shob. Heart sounds were normal and abd was soft non-tender x4 extremities. Pt reports not having any more loose stools today. Medication was administered and verified against the [facility] with RN Angela's oversight. Pictures of her (L) lower leg and posterior upper leg were taken this evening to update the chart. No other tasks needed this visit. The patient was left in care of her daughter. H@H  visit completed.

## 2024-01-09 NOTE — Progress Notes (Signed)
 Writer was out early this afternoon for morning visit with patient. On arrival, I was welcomed at the door by the patients daughter who was with her throughout the visit. On assessment, lung sounds diminished in the (L) and (R) lower lobes but clear throughout. Abd soft non-tender x4 quadrants, heart sounds S1, S2, and (L) lower extremity is reddened with skin flaking and warmth. Redness is currently all of (L) lower leg circumferential with redness creeping up posteriorly to mid upper leg. Pt reports shob at rest and exertion with a pain score of 3/10 in her legs at rest. No other complaints at this time. Medications were administered and verified with the MAR. A video visit was conducted by the provider. Labs were drawn as ordered. A nebulizer treatment was given to the patient who stated that she felt like she could breathe much better afterwards. Lungs sounds obtained after nebulizer showed moderate improvement to diminished lung sounds previously heard before Duoneb tx. No other tasks were needed at this time. The patient was left in care of her daughter. H@H  visit completed.

## 2024-01-09 NOTE — Progress Notes (Addendum)
 49- RN introduction, Patient identifiers completed. Patient A&O, currently denied pain or any medical concerns at this time. Patient reported the paramedic just left. Assessment completed. Plan for overnight monitoring, medication administration assistance, [facility] contact information reviewed. Patient agreed to a video call for scheduled 2200 PO medication assistance. Patient encouraged to contact [facility] RN as needed.    2051-Patient contact via Video call. Patient sitting at kitchen table with daughter/caregiver at side. Patient identifiers completed. Medication assistance provided for two medications with [employer] and caregiver/daughter assistance. Per Patient no other questions or concerns at the time of video call reported.  Patient/daughter informed Tax Inspector will continue to be monitored overnight; both encouraged to call as needed. [facility] contact information reviewed.

## 2024-01-09 NOTE — Progress Notes (Signed)
 Spoke with patient at [phone] via video ,pt wanted to take imodium for loose stool. Pt had no other complaints.   Updated pt via phone (spoke with daughter Shona) at 19 ,paramedic visit would be delayed.offered to supervise patients PO morning meds but she stated she would wait for paramedic visit. Reinforced to call nurse for any needs.

## 2024-01-09 NOTE — Progress Notes (Addendum)
 [facility] -- Daily Progress Note   Patient: Briana Ochoa  MRN: [id number]  DOB: [birth year]  DOA: 01/05/2024  DOS: the patient was seen and examined on @TODAY @    Patient identified themself as Briana Ochoa  DOB 1940-10-26  Medic Consuelo Kerns present in the home during encounter and performed the assessment and physical exam. Patient was seen today via video conference; my physical location [place], [place]   CC: left leg pain swelling and redness  HPI & brief hospital course:  Briana Ochoa is 83 year old with history of multiple primary cancers to include melanoma, breast cancer, bladder cancer, colon cancer, now undergoing treatment for non-small cell lung cancer who has left lower extremity presenting cellulitis.  This has been treated by Doxy x 2 both times has gotten somewhat better but then recurs.  The patient reports several day history of increasing swelling edema, erythema, the patient has previously had 2 negative DVT studies in the past.  In the ED she was noted to have no significant white count, no fever, some elevation of her AST and alk phos as well as a acute on chronic kidney injury.    Pt was admitted to the hospital on 01/05/24 for IV antibiotics for left lower extremity cellulitis requiring IV antibiotics, having failed outpatient therapy.  11/30 - transferred to [facility] program to continue IV antibiotics pending further improvement  12/1 - pt's left leg slowly improving, has persistent warmth, erythema with early receding, tenderness and pain ongoing.  Continue on IV Rocephin .  IV Zyvox >> PO Zyvox for evening and remainder of course. Day 4 antibiotics  12/2 - persistent LLE erythema, today extends up the posterior leg to above the knee, ongoing pain/tenderness.   Day 5 antibiotics: IV Rocephin , PO Zyvox   Assessment and Plan:   * Lower extremity cellulitis Presented with erythema, edema, pain of the left lower extremity.  Treated  with 2 courses of doxycycline  as outpatient. Started on IV ceftriaxone  and vancomycin on admission. Vancomycin >> Zyvox on 11/30. Leukocytosis improving. DVT ruled out on 11/30 12/2 - persistent pain, tenderness, erythema improved since admission but with some extension reported up the posterior leg to above knee Day 5 antibiotics --Continue IV Rocephin  and PO Zyvox --Follow blood cultures - neg x 4 days  Stage 3b chronic kidney disease (HCC) Baseline creatinine ~1.4. Cr trend:  1.46 >> 1.71 (given gentle IV fluids in hospital) >> 1.49 >> 1.28 >> 1.52 >> 1.48 today. 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 Pt gets B12 injections.  History of DVT of lower extremity Negative LLE Doppler on 11/30. No longer on anticoagulation --Monitor --Ambulate  --Consider low dose Eliquis  for DVT prophylaxis given pt at risk with malignancy.  Hyperlipidemia associated with type 2 diabetes mellitus (HCC) 01/09/24 --Continue pravastatin   History of tobacco abuse Patient quit smoking about 20 years ago.  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) 11/20/2023    Priority: Low   History  of breast cancer 04/02/2021    Priority: Low   History of colon cancer 04/02/2021    Priority: Low   B12 deficiency 03/27/2021    Priority: Low   History of DVT of lower extremity 04/16/2020    Priority: Low   Hyperlipidemia associated with type 2 diabetes mellitus (HCC) 04/09/2020    Priority: Low   History of tobacco abuse 07/17/2013    Priority: Low   Venous ulcer of left leg (HCC) 12/25/2023   Chronic venous insufficiency 12/25/2023   Chronic bronchitis, unspecified chronic bronchitis type (HCC) 07/11/2023   Pulmonary nodules 04/02/2021   History of bladder cancer 04/02/2021   History of tobacco use 04/02/2021   Diabetic retinopathy (HCC) 12/03/2020   Genetic testing 07/30/2020   Family history of kidney cancer    Family history of breast cancer    Family history of uterine cancer    Family history of throat cancer    Cancer of left colon (HCC) 05/28/2020   Iron deficiency anemia due to chronic blood loss 04/09/2020   Positive D dimer 04/08/2020   Symptomatic anemia 04/08/2020   Diabetes mellitus (HCC) 11/19/2018   Bladder cancer (HCC) 02/09/2018   Weakness 09/28/2017   Hematuria 09/28/2017   DOE (dyspnea on exertion) 09/28/2017   Malignant melanoma of lower leg, right (HCC) 12/28/2015   Cerebral arterial aneurysm 09/16/2013   Right internal carotid artery aneurysm 08/21/2013   Meningioma, multiple (HCC) 08/21/2013   OA (osteoarthritis) of knee 04/16/2013      Significant Events: Admitted 01/05/2024 Transferred to [facility] 11/30 for further IV antibiotics in the setting of immunosuppression     Subjective / Interval 24 hour History:  Pt seen virtually this morning during Medic's home visit.  She reports pain in her leg with standing or walking. Feels okay when she's at rest.  Denies fever/chills.  Reports fatigue more than she would expect this far out from her last chemo treatment, typically would be feeling improved by now.  Nausea this AM, given Zofran   with improvement.  Discussed schedule for Tyelnol for chronic left knee pain.        Admission Labs: Cr 1.71 slightly above baseline ~1.4 / BUN 36 Lactic acid 2.0 WBC normal 9.0 Hbg stable 12.5  Admission Imaging Studies: none  Significant Labs: Cr trend: 1.71 >> 1.49 >> 1.28 >> 1.52 >> 1.48 stable WBC trend: 9.0 >> 9.9 >> 12.5 >> 12.1 >> 11.4 improving Hbg trend:  12.5 >> 10.8 >> 11.3 >> 11.1 >> 10.3   Significant Imaging Studies: 11/30 -- LLE venous doppler U/S negative for DVT    Antibiotic Therapy:  12/2 day 5 of antibiotics -- IV Rocephin ,  PO Zyvox    Anti-infectives (From admission, onward)    Start     Dose/Rate Route Frequency Ordered Stop   01/08/24 1800  linezolid  (ZYVOX ) tablet 600 mg        600 mg Oral Every 12 hours 01/08/24 1526     01/08/24 1000  linezolid  (ZYVOX ) IVPB 600 mg  Status:  Discontinued        600 mg 300 mL/hr over 60 Minutes Intravenous Every 12 hours 01/07/24 1155 01/07/24 1243  01/07/24 1330  linezolid (ZYVOX) IVPB 600 mg  Status:  Discontinued        600 mg 300 mL/hr over 60 Minutes Intravenous Every 12 hours 01/07/24 1243 01/08/24 1526   01/06/24 1600  vancomycin (VANCOCIN) IVPB 1000 mg/200 mL premix  Status:  Discontinued        1,000 mg 200 mL/hr over 60 Minutes Intravenous Every 48 hours 01/05/24 1736 01/07/24 1155   01/06/24 1000  cefTRIAXone  (ROCEPHIN ) 1 g in sodium chloride  0.9 % 100 mL IVPB        1 g 200 mL/hr over 30 Minutes Intravenous Every 24 hours 01/05/24 1642     01/06/24 0000  vancomycin (VANCOCIN) IVPB 1000 mg/200 mL premix  Status:  Discontinued        1,000 mg 200 mL/hr over 60 Minutes Intravenous  Once 01/05/24 1642 01/05/24 1650   01/05/24 1600  vancomycin (VANCOREADY) IVPB 1500 mg/300 mL        1,500 mg 150 mL/hr over 120 Minutes Intravenous  Once 01/05/24 1553 01/05/24 1813   01/05/24 1545  cefTRIAXone  (ROCEPHIN ) 2 g in sodium chloride  0.9 % 100 mL IVPB        2 g 200 mL/hr over 30 Minutes Intravenous  Once  01/05/24 1544 01/05/24 1641       Procedures: None  Consultants: None          Physical Exam:    01/09/2024    6:00 PM 01/09/2024    1:18 PM 01/08/2024    7:34 PM  Vitals with BMI  Systolic 110 117 889  Diastolic 59 72 78  Pulse 87 82       General exam: awake, alert, no acute distress HEENT: Voice clear, impaired hearing but able to interact via tablet adequately Respiratory system: Lungs clear bilaterally diminished, normal respiratory effort on room air. Cardiovascular system: Normal S1/S2, regular rate and rhythm, cap refill less than 3 seconds, +1 LLE edema Gastrointestinal system: soft, NT, ND, +bowel sounds. Central nervous system: A&O x 4. no gross focal neurologic deficits, normal speech Extremities: left lower extremity erythema - photos for today are pending.  Erythema reported to extend up poster left leg  Skin: left leg images below Psychiatry: normal mood, congruent affect, judgement and insight appear normal     Data Reviewed: Reviewed above    Family Communication: Daughter was present in the room during the virtual encounter this morning  Disposition: Status is: Inpatient Remains inpatient appropriate because: Remains on IV antibiotics pending further clinical improvement in the setting of immunosuppression secondary to chemotherapy malignancy   Planned Discharge Destination: Home    Time spent: 45 minutes  Author: Burnard DELENA Cunning, DO [facility]  09/28/2023 7:00 AM  For on call review [email or web address].

## 2024-01-10 DIAGNOSIS — Z86718 Personal history of other venous thrombosis and embolism: Secondary | ICD-10-CM

## 2024-01-10 DIAGNOSIS — Z853 Personal history of malignant neoplasm of breast: Secondary | ICD-10-CM

## 2024-01-10 DIAGNOSIS — I10 Essential (primary) hypertension: Secondary | ICD-10-CM

## 2024-01-10 DIAGNOSIS — E1169 Type 2 diabetes mellitus with other specified complication: Secondary | ICD-10-CM

## 2024-01-10 DIAGNOSIS — N1832 Chronic kidney disease, stage 3b: Secondary | ICD-10-CM

## 2024-01-10 DIAGNOSIS — E785 Hyperlipidemia, unspecified: Secondary | ICD-10-CM

## 2024-01-10 DIAGNOSIS — E538 Deficiency of other specified B group vitamins: Secondary | ICD-10-CM

## 2024-01-10 DIAGNOSIS — Z85038 Personal history of other malignant neoplasm of large intestine: Secondary | ICD-10-CM

## 2024-01-10 LAB — CBC
HCT: 36 % (ref 36.0–46.0)
Hemoglobin: 11.5 g/dL — ABNORMAL LOW (ref 12.0–15.0)
MCH: 30.8 pg (ref 26.0–34.0)
MCHC: 31.9 g/dL (ref 30.0–36.0)
MCV: 96.5 fL (ref 80.0–100.0)
Platelets: 247 K/uL (ref 150–400)
RBC: 3.73 MIL/uL — ABNORMAL LOW (ref 3.87–5.11)
RDW: 15.1 % (ref 11.5–15.5)
WBC: 10.9 K/uL — ABNORMAL HIGH (ref 4.0–10.5)
nRBC: 0.6 % — ABNORMAL HIGH (ref 0.0–0.2)

## 2024-01-10 LAB — BASIC METABOLIC PANEL WITH GFR
Anion gap: 12 (ref 5–15)
BUN: 21 mg/dL (ref 8–23)
CO2: 25 mmol/L (ref 22–32)
Calcium: 8.5 mg/dL — ABNORMAL LOW (ref 8.9–10.3)
Chloride: 100 mmol/L (ref 98–111)
Creatinine, Ser: 1.68 mg/dL — ABNORMAL HIGH (ref 0.44–1.00)
GFR, Estimated: 30 mL/min — ABNORMAL LOW (ref >60)
Glucose, Bld: 167 mg/dL — ABNORMAL HIGH (ref 70–99)
Potassium: 5 mmol/L (ref 3.5–5.1)
Sodium: 137 mmol/L (ref 135–145)

## 2024-01-10 LAB — CULTURE, BLOOD (ROUTINE X 2)
Culture: NO GROWTH
Culture: NO GROWTH
Special Requests: ADEQUATE

## 2024-01-10 MED ORDER — LOPERAMIDE HCL 2 MG PO CAPS
4.0000 mg | ORAL_CAPSULE | ORAL | Status: DC | PRN
  Administered 2024-01-10 – 2024-01-11 (×2): 4 mg via ORAL
  Filled 2024-01-10 (×8): qty 2

## 2024-01-10 NOTE — Progress Notes (Signed)
 PROGRESS NOTE  Briana Ochoa  FMW:[id number] DOB: 1940-03-31 DOA: 01/05/2024 PCP: [name], NP   Patient identified as Briana Ochoa, DOB 09-25-1940. Medic is Lauraine Faes, and she was in the home during the encounter and performed the assessment and physical exam. Patient was seen today via video conference.  Briana Ochoa is 83 year old with history of multiple primary cancers to include melanoma, breast cancer, bladder cancer, colon cancer, now undergoing treatment for non-small cell lung cancer who has left lower extremity presenting cellulitis.  This has been treated by Doxy x 2 both times has gotten somewhat better but then recurs.  The patient reports several day history of increasing swelling edema, erythema, the patient has previously had 2 negative DVT studies in the past.  In the ED she was noted to have no significant white count, no fever, some elevation of her AST and alk phos as well as a acute on chronic kidney injury.    Pt was admitted to the hospital on 01/05/24 for IV antibiotics for left lower extremity cellulitis requiring IV antibiotics, having failed outpatient therapy.  11/30 - transferred to [facility] program to continue IV antibiotics pending further improvement 12/1 - pt's left leg slowly improving, has persistent warmth, erythema with early receding, tenderness and pain ongoing.  Continue on IV Rocephin .  IV Zyvox  >> PO Zyvox  for evening and remainder of course. Day 4 antibiotics 12/2 - persistent LLE erythema, today extends up the posterior leg to above the knee, ongoing pain/tenderness.   Day 5 antibiotics: IV Rocephin , PO Zyvox   12/3: I assumed care of the patient. day 6 of IV abx. Increased loperamide  to 4 mg BID as needed for diarrhea, loose stools. DME nebulizer machine ordered. Discussed diet to encourage healthy skin, colorful vegetables, fresh fruit, baked protein such as salmon, chicken breast. Discussed avoiding tobacco use,  alcohol, and decreasing-eliminating sugar and/or processed food from her diet. Discussed keeping skin moisturized BID.  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  Treated with 2 courses of doxycycline  as outpatient. Started on IV ceftriaxone  and vancomycin  on admission. Vancomycin  >> Zyvox  on 11/30. Leukocytosis improving. DVT ruled out on 11/30 12/2 - persistent pain, tenderness, erythema improved since admission but with some extension reported up the posterior leg to above knee Day 5 antibiotics, Continue IV Rocephin  and PO Zyvox , Follow blood cultures - neg x 4 days  12/3: day 6 of antibiotics. Discussed diet to encourage healthy skin, colorful vegetables, fresh fruit, baked protein such as salmon, chicken breast. Discussed avoiding tobacco use, alcohol, and decreasing-eliminating sugar and/or processed food from her diet. Discussed moisturizing her skin after drying it after a shower. Encourage keeping skin moisturized twice daily with basic skin lotion. Patient/daughter at patient's side, states pt has eucerin lotion.  This is patient's third episode of cellulitis and pt and daughter concerned that it is recurring after chemo therapy treatment. She has next chemo tx on Monday, 12/8. At this time, I will plan for 12-14 course  total of abx treatment on discharge. Plan for discharge tomorrow after receiving day 7 of IV abx.   Stage 3b chronic kidney disease (HCC) Baseline creatinine ~1.4. Cr trend:  1.46 >> 1.71 (given gentle IV fluids  in hospital) >> 1.49 >> 1.28 >> 1.52 >> 1.48 today. Encourage PO hydration Additional IV fluids as needed Monitor BMP Avoid nephrotoxic agents & renally dose meds Recheck BMP today  HTN (hypertension) 12/2 - BP stable, Continue lisinopril , Monitor BP and titrate regimen  NSCLC metastatic to bone [facility]) Currently on chemotherapy Management per oncology, follow up as scheduled  History of colon cancer No evidence of recurrence  History of breast cancer No evidence of recurrence  B12 deficiency B12 level 1107 elevated. Pt gets B12 injections.  History of DVT of lower extremity Negative LLE Doppler on 11/30. No longer on anticoagulation Monitor, Ambulate as tolerated Consider low dose Eliquis  for DVT prophylaxis given pt at risk with malignancy.  Hyperlipidemia associated with type 2 diabetes mellitus (HCC) Continue pravastatin  20 mg every evening  History of tobacco abuse Patient quit smoking about 20 years ago.  Cancer of left colon (HCC) No current evidence of recurrence  DVT prophylaxis: ambulate as tolerated Code Status: full code Family Communication: updated daughter, Shona at bedside. Disposition Plan:  Level of care: [facility]  Consultants:  PT, OT, TOC  Procedures:  None indicated  Antimicrobials: 12/3: day 6 of IV antibiotics   Subjective:  At bedside, patient able to tell me her first and last name, her age, and her date of birth.  She knows she is at home.  She discussed to me her concern including recurrent cellulitis.  This is her third treatment.  She initially had the first episode of cellulitis that was treated with a 7-day course of doxycycline  on October 20.  On October 29, after completing doxycycline , within 1 day the swelling and redness returned and she presented to PCP and was given another 7-day course of doxycycline  which she completed.  After completing the second round of doxycycline , the redness and swelling of the  left lower extremity returns once again.  We discussed that her chemo treatment is next Monday, 12/8.  Daughter was asking if there are any benefits to deferring the next chemo treatment until her cellulitis has completely resolved.  I discussed that I can prescribed a complete 14-day course for cellulitis treatment which would take her until 12/11.  Daughter and patient is happy with this plan.  We also discussed keeping the skin moisturized on lower extremities twice a day and a diet that would encourage wound healing.  Objective: Vitals:   01/09/24 1800 01/09/24 1919 01/10/24 0619 01/10/24 0900  BP: (!) 110/59   (!) 144/81  Pulse: 87  (!) 107 79  Resp: 18  19 18   Temp:    98.2 F (36.8 C)  TempSrc: Oral   Oral  SpO2: 93% 92% 97% 95%  Weight:      Height:       No intake or output data in the 24 hours ending 01/10/24 1033 Filed Weights   01/05/24 1329 01/07/24 1553  Weight: 87.1 kg 87.4 kg   Examination with the assistance of medic, Lauraine Faes:  General exam: Appears calm and comfortable  Respiratory system: Clear to auscultation. Respiratory effort normal. Cardiovascular system: S1 & S2 heard, RRR. No JVD, murmurs, rubs, gallops or clicks. No pedal edema. Gastrointestinal system: Obese abdomen, abdomen is nondistended, soft and nontender. No organomegaly or masses  felt. Normal bowel sounds heard. Central nervous system: Alert and oriented. No focal neurological deficits. Extremities: Symmetric 5 x 5 power. Skin: Redness and swelling of the left lower extremity is improved though persistent.        Psychiatry: Judgement and insight appear normal. Mood & affect appropriate.   Data Reviewed: I have personally reviewed following labs and imaging studies  CBC: Recent Labs  Lab 01/05/24 1503 01/06/24 0535 01/07/24 0645 01/08/24 1128 01/09/24 1340  WBC 9.0 9.9 12.5* 12.1* 11.4*  NEUTROABS 6.2  --   --   --   --   HGB 12.5 10.8* 11.3* 11.1* 10.3*  HCT 38.6 32.9*  35.1* 34.2* 31.9*  MCV 96.7 95.6 96.7 95.5 94.9  PLT 167 184 223 268 363   Basic Metabolic Panel: Recent Labs  Lab 01/05/24 1503 01/06/24 0535 01/07/24 0645 01/08/24 1128 01/09/24 1340  NA 135 135 133* 136 136  K 5.1 4.8 4.8 4.4 4.8  CL 101 103 102 98 105  CO2 23 22 20* 24 24  GLUCOSE 142* 149* 148* 190* 136*  BUN 36* 32* 26* 25* 26*  CREATININE 1.71* 1.49* 1.28* 1.52* 1.48*  CALCIUM  9.7 9.2 9.8 9.0 8.6*   GFR: Estimated Creatinine Clearance: 30.8 mL/min (A) (by C-G formula based on SCr of 1.48 mg/dL (H)).  Liver Function Tests: Recent Labs  Lab 01/05/24 1503  AST 60*  ALT 27  ALKPHOS 141*  BILITOT 0.6  PROT 7.4  ALBUMIN  3.3*   CBG: Recent Labs  Lab 01/06/24 1133 01/06/24 1647 01/06/24 2307 01/07/24 0735 01/07/24 1129  GLUCAP 148* 99 131* 141* 143*   Sepsis Labs: Recent Labs  Lab 01/05/24 1515 01/09/24 1342  LATICACIDVEN 2.0* 1.2   Recent Results (from the past 240 hours)  Blood culture (routine x 2)     Status: None   Collection Time: 01/05/24  3:03 PM   Specimen: BLOOD LEFT ARM  Result Value Ref Range Status   Specimen Description   Final    BLOOD LEFT ARM Performed at [facility], [address] [place], [place] [address]    Special Requests   Final    BOTTLES DRAWN AEROBIC AND ANAEROBIC Blood Culture results may not be optimal due to an inadequate volume of blood received in culture bottles Performed at [facility], [address], [place], [place] [address]    Culture   Final    NO GROWTH 5 DAYS Performed at [facility], [address], [place], [place] [address]    Report Status 01/10/2024 FINAL  Final  Resp panel by RT-PCR (RSV, Flu A&B, Covid) Anterior Nasal Swab     Status: None   Collection Time: 01/05/24  3:14 PM   Specimen: Anterior Nasal Swab  Result Value Ref Range Status   SARS Coronavirus 2 by RT PCR NEGATIVE NEGATIVE Final    Comment: (NOTE) SARS-CoV-2 target nucleic acids are NOT  DETECTED.  The SARS-CoV-2 RNA is generally detectable in upper respiratory specimens during the acute phase of infection. The lowest concentration of SARS-CoV-2 viral copies this assay can detect is 138 copies/mL. A negative result does not preclude SARS-Cov-2 infection and should not be used as the sole basis for treatment or other patient management decisions. A negative result may occur with  improper specimen collection/handling, submission of specimen other than nasopharyngeal swab, presence of viral mutation(s) within the areas targeted by this assay, and inadequate number of viral copies(<138 copies/mL). A negative result must be  combined with clinical observations, patient history, and epidemiological information. The expected result is Negative.  Fact Sheet for Patients:  bloggercourse.com  Fact Sheet for Healthcare Providers:  seriousbroker.it  This test is no t yet approved or cleared by the [place]  FDA and  has been authorized for detection and/or diagnosis of SARS-CoV-2 by FDA under an Emergency Use Authorization (EUA). This EUA will remain  in effect (meaning this test can be used) for the duration of the COVID-19 declaration under Section 564(b)(1) of the Act, 21 U.S.C.section 360bbb-3(b)(1), unless the authorization is terminated  or revoked sooner.       Influenza A by PCR NEGATIVE NEGATIVE Final   Influenza B by PCR NEGATIVE NEGATIVE Final    Comment: (NOTE) The Xpert Xpress SARS-CoV-2/FLU/RSV plus assay is intended as an aid in the diagnosis of influenza from Nasopharyngeal swab specimens and should not be used as a sole basis for treatment. Nasal washings and aspirates are unacceptable for Xpert Xpress SARS-CoV-2/FLU/RSV testing.  Fact Sheet for Patients: bloggercourse.com  Fact Sheet for Healthcare Providers: seriousbroker.it  This test is not yet  approved or cleared by the [place]  FDA and has been authorized for detection and/or diagnosis of SARS-CoV-2 by FDA under an Emergency Use Authorization (EUA). This EUA will remain in effect (meaning this test can be used) for the duration of the COVID-19 declaration under Section 564(b)(1) of the Act, 21 U.S.C. section 360bbb-3(b)(1), unless the authorization is terminated or revoked.     Resp Syncytial Virus by PCR NEGATIVE NEGATIVE Final    Comment: (NOTE) Fact Sheet for Patients: bloggercourse.com  Fact Sheet for Healthcare Providers: seriousbroker.it  This test is not yet approved or cleared by the [place]  FDA and has been authorized for detection and/or diagnosis of SARS-CoV-2 by FDA under an Emergency Use Authorization (EUA). This EUA will remain in effect (meaning this test can be used) for the duration of the COVID-19 declaration under Section 564(b)(1) of the Act, 21 U.S.C. section 360bbb-3(b)(1), unless the authorization is terminated or revoked.  Performed at [facility], [address] [place], [place] [address]   Blood culture (routine x 2)     Status: None   Collection Time: 01/05/24  5:19 PM   Specimen: BLOOD RIGHT HAND  Result Value Ref Range Status   Specimen Description   Final    BLOOD RIGHT HAND Performed at [facility], [address] [place], [place] [address]    Special Requests   Final    BOTTLES DRAWN AEROBIC ONLY Blood Culture adequate volume Performed at [facility], [address], [place], [place] [address]    Culture   Final    NO GROWTH 5 DAYS Performed at [facility], [address], [place], [place] [address]    Report Status 01/10/2024 FINAL  Final     Radiology Studies: No results found.  Scheduled Meds:  acetaminophen   1,000 mg Oral BID   aspirin  EC  81 mg Oral Daily   folic acid   1 mg Oral Daily   linezolid  600  mg Oral Q12H   lisinopril   20 mg Oral Daily   pravastatin   20 mg Oral QPM   Continuous Infusions:  cefTRIAXone  (ROCEPHIN )  IV 1 g (01/10/24 0940)    LOS: 4 days   Time spent: 50 minutes  Dr. Sherre Location: [place]  [facility] If 7PM-7AM, please contact night-coverage 01/10/2024, 10:33 AM

## 2024-01-10 NOTE — Progress Notes (Signed)
 Pt seen for routine [facility] visit. Pt appears well. Pt's daughter is present.  Vital signs and assessment obtained as noted.  Medications administered as noted. IV care completed.  Pt's left lower leg appears slightly more red than earlier this morning, however, she has been ambulating more than normal and has applied lotion. Pt's left leg also feels slightly warmer to the touch. Photos uploaded to epic and Tax Inspector notified.  Pt encouraged to call with any problems or questions this evening if needed.

## 2024-01-10 NOTE — TOC Progression Note (Signed)
 Transition of Care [facility]) - Progression Note    Patient Details  Name: Briana Ochoa MRN: [id number] Date of Birth: July 18, 1940  Transition of Care [facility]) CM/SW Contact  Rosalva Jon Bloch, RN Phone Number: 01/10/2024, 11:55 AM  Clinical Narrative:    Order noted for DME: nebulizer. Referral made with [employer]. Equipment will be delivered to pt's home today. Pt made aware.  ICM team following and will continue assisting with needs...   Expected Discharge Plan: Home w Home Health Services Barriers to Discharge: Continued Medical Work up               Expected Discharge Plan and Services   Discharge Planning Services: CM Consult Post Acute Care Choice: Home Health Living arrangements for the past 2 months: Single Family Home                 DME Arranged: Community education officer DME Agency: [employer] Date DME Agency Contacted: 01/10/24 Time DME Agency Contacted: [phone] Representative spoke with at DME Agency: [place] HH Arranged: PT HH Agency: [facility] Date [facility] Agency Contacted: 01/09/24 Time HH Agency Contacted: [phone] Representative spoke with at [facility] Agency: [place]   Social Drivers of Health (SDOH) Interventions SDOH Screenings   Food Insecurity: No Food Insecurity (01/06/2024)  Housing: Low Risk  (01/06/2024)  Transportation Needs: No Transportation Needs (01/06/2024)  Utilities: Not At Risk (01/06/2024)  Depression (PHQ2-9): Low Risk  (12/04/2023)  Physical Activity: Unknown (04/27/2018)  Social Connections: Moderately Integrated (01/05/2024)  Stress: No Stress Concern Present (04/27/2018)  Tobacco Use: Medium Risk (01/05/2024)    Readmission Risk Interventions     No data to display

## 2024-01-10 NOTE — Plan of Care (Addendum)
  Problem: Clinical Measurements: Goal: Will remain free from infection Outcome: Progressing   Problem: Clinical Measurements: Goal: Ability to maintain clinical measurements within normal limits will improve Outcome: Progressing   Problem: Nutrition: Goal: Adequate nutrition will be maintained Outcome: Progressing   Problem: Elimination: Goal: Will not experience complications related to bowel motility Outcome: Progressing Goal: Will not experience complications related to urinary retention Outcome: Progressing   Problem: Pain Managment: Goal: General experience of comfort will improve and/or be controlled Outcome: Progressing   Problem: Safety: Goal: Ability to remain free from injury will improve Outcome: Progressing   Problem: Skin Integrity: Goal: Risk for impaired skin integrity will decrease Outcome: Progressing

## 2024-01-10 NOTE — Plan of Care (Signed)

## 2024-01-10 NOTE — Progress Notes (Signed)
 Pt seen for routine [facility] visit. Pt appears well and states she feels okay. Pt's daughter is present for visit.   Pt states she feels slightly SOB with exertion but is able to recover in approximately 2-3 minutes after sitting down. Pt's daughter states Pt was able to do a neb treatment which helped loosen up mucous. Pt has a dry cough mainly in the morning when she wakes up but is unable to to get any mucous up. We decided to start doinga neb when she begins coughing in the mornings to help loosen up mucous. Pt agrees and understands she will need to call virtual RN if we are not here to do treatments.  Vital signs and assessment obtained as noted. Pt's lungs are clear in upper fields and clear/ diminished in lower fields. Pt's left leg with mild +1 edema, but appears less red than previously noted. Photos taken and uploaded to media tab.  Pt had virtual visit with Shanda, RN and Dr. Sherre. Plan of care discussed and daughter was present for encounter.  Medications administered. IV care completed including new curos cap. Labs drawn. Pt infomred we will be back out this afternoon/ evening for second visit. Pt's daughter requests 5-6pm if possible so that she can be present when she gets off work.

## 2024-01-10 NOTE — Progress Notes (Signed)
 Patient requesting tylenol  for 7/10 left leg pain. Aox4. Loose stools ongoing. Dry cough present, Robitussin DM ordered. Plan of care reviewed with patient. Plan of paramedic visit between [phone] today.

## 2024-01-10 NOTE — Progress Notes (Signed)
 Phone call completed with patient. Introduced myself as the [name] FUNDRAISER for the shift and encouraged patient to call via phone or tablet at anytime if needs arise. Patient denies needs at this time and plan was made for evening medication pass.

## 2024-01-10 NOTE — Plan of Care (Signed)
  Problem: Education: Goal: Knowledge of General Education information will improve Description: Including pain rating scale, medication(s)/side effects and non-pharmacologic comfort measures Outcome: Progressing   Problem: Health Behavior/Discharge Planning: Goal: Ability to manage health-related needs will improve Outcome: Progressing   Problem: Clinical Measurements: Goal: Ability to maintain clinical measurements within normal limits will improve Outcome: Progressing Goal: Respiratory complications will improve Outcome: Progressing Goal: Cardiovascular complication will be avoided Outcome: Progressing   Problem: Activity: Goal: Risk for activity intolerance will decrease Outcome: Progressing   Problem: Nutrition: Goal: Adequate nutrition will be maintained Outcome: Progressing   Problem: Pain Managment: Goal: General experience of comfort will improve and/or be controlled Outcome: Progressing   Problem: Safety: Goal: Ability to remain free from injury will improve Outcome: Progressing   Problem: Skin Integrity: Goal: Risk for impaired skin integrity will decrease Outcome: Progressing   Problem: Clinical Measurements: Goal: Ability to avoid or minimize complications of infection will improve Outcome: Progressing   Problem: Skin Integrity: Goal: Skin integrity will improve Outcome: Progressing

## 2024-01-10 NOTE — Progress Notes (Addendum)
 Outreach to [employer] to confirm accuracy of intermittent Hypoxia alerts. Unable to reach. Will continue to monitor.   9362: per Current Health data, SpO2 94%   0640: outreach call to patient who reported currently experiencing diarrhea, will push need help button when ready for medication assistance.

## 2024-01-11 ENCOUNTER — Encounter: Payer: Self-pay | Admitting: Hematology

## 2024-01-11 ENCOUNTER — Other Ambulatory Visit (HOSPITAL_COMMUNITY): Payer: Self-pay

## 2024-01-11 DIAGNOSIS — R7989 Other specified abnormal findings of blood chemistry: Secondary | ICD-10-CM

## 2024-01-11 LAB — BASIC METABOLIC PANEL WITH GFR
Anion gap: 9 (ref 5–15)
BUN: 20 mg/dL (ref 8–23)
CO2: 24 mmol/L (ref 22–32)
Calcium: 8 mg/dL — ABNORMAL LOW (ref 8.9–10.3)
Chloride: 105 mmol/L (ref 98–111)
Creatinine, Ser: 1.51 mg/dL — ABNORMAL HIGH (ref 0.44–1.00)
GFR, Estimated: 34 mL/min — ABNORMAL LOW (ref >60)
Glucose, Bld: 148 mg/dL — ABNORMAL HIGH (ref 70–99)
Potassium: 4.2 mmol/L (ref 3.5–5.1)
Sodium: 138 mmol/L (ref 135–145)

## 2024-01-11 MED ORDER — IPRATROPIUM-ALBUTEROL 0.5-2.5 (3) MG/3ML IN SOLN
3.0000 mL | Freq: Four times a day (QID) | RESPIRATORY_TRACT | 1 refills | Status: AC | PRN
  Filled 2024-01-11: qty 360, 30d supply, fill #0

## 2024-01-11 MED ORDER — LOPERAMIDE HCL 2 MG PO CAPS
4.0000 mg | ORAL_CAPSULE | ORAL | 0 refills | Status: AC | PRN
  Filled 2024-01-11: qty 25, 8d supply, fill #0

## 2024-01-11 MED ORDER — CEPHALEXIN 500 MG PO CAPS
500.0000 mg | ORAL_CAPSULE | Freq: Two times a day (BID) | ORAL | 0 refills | Status: AC
  Filled 2024-01-11: qty 14, 7d supply, fill #0

## 2024-01-11 MED ORDER — ONDANSETRON 4 MG PO TBDP
4.0000 mg | ORAL_TABLET | Freq: Three times a day (TID) | ORAL | 0 refills | Status: AC | PRN
  Filled 2024-01-11: qty 20, 7d supply, fill #0

## 2024-01-11 MED ORDER — LINEZOLID 600 MG PO TABS
600.0000 mg | ORAL_TABLET | Freq: Two times a day (BID) | ORAL | 0 refills | Status: AC
  Filled 2024-01-11: qty 14, 7d supply, fill #0

## 2024-01-11 MED ORDER — GUAIFENESIN-DM 100-10 MG/5ML PO SYRP
5.0000 mL | ORAL_SOLUTION | Freq: Four times a day (QID) | ORAL | 0 refills | Status: AC | PRN
  Filled 2024-01-11: qty 118, 6d supply, fill #0

## 2024-01-11 MED ORDER — SALINE SPRAY 0.65 % NA SOLN
1.0000 | NASAL | 0 refills | Status: AC | PRN
  Filled 2024-01-11: qty 44, 30d supply, fill #0

## 2024-01-11 MED ORDER — SODIUM CHLORIDE 0.9 % IV BOLUS
250.0000 mL | Freq: Once | INTRAVENOUS | Status: AC
  Administered 2024-01-11: 250 mL via INTRAVENOUS

## 2024-01-11 NOTE — TOC Transition Note (Signed)
 Transition of Care [facility]) - Discharge Note   Patient Details  Name: Briana Ochoa MRN: [id number] Date of Birth: [birth year]  Transition of Care [facility]) CM/SW Contact:  Corean JAYSON Canary, RN Phone Number: 01/11/2024, 4:03 PM   Clinical Narrative:     Patient will be discharged.  [facility] aware of discharge Nebulizer to hoe by [employer] No Further needs identified  Final next level of care: Home w Home Health Services Barriers to Discharge: No Barriers Identified   Patient Goals and CMS Choice Patient states their goals for this hospitalization and ongoing recovery are:: home   Choice offered to / list presented to : Patient [place] ownership interest in [facility].provided to:: Patient    Discharge Placement                       Discharge Plan and Services Additional resources added to the After Visit Summary for     Discharge Planning Services: CM Consult Post Acute Care Choice: Home Health          DME Arranged: Nebulizer machine DME Agency: [employer] Date DME Agency Contacted: 01/10/24 Time DME Agency Contacted: [phone] Representative spoke with at DME Agency: [place] HH Arranged: PT HH Agency: [facility] Date [facility] Agency Contacted: 01/09/24 Time HH Agency Contacted: [phone] Representative spoke with at [facility] Agency: Darleene  Social Drivers of Health (SDOH) Interventions SDOH Screenings   Food Insecurity: No Food Insecurity (01/06/2024)  Housing: Low Risk  (01/06/2024)  Transportation Needs: No Transportation Needs (01/06/2024)  Utilities: Not At Risk (01/06/2024)  Depression (PHQ2-9): Low Risk  (12/04/2023)  Physical Activity: Unknown (04/27/2018)  Social Connections: Moderately Integrated (01/05/2024)  Stress: No Stress Concern Present (04/27/2018)  Tobacco Use: Medium Risk (01/05/2024)     Readmission Risk Interventions     No data to display

## 2024-01-11 NOTE — Progress Notes (Signed)
 Writer was out to see the patient this afternoon for first visit of the day. On arrival, I was met at the door and welcomed in by the patients daughter. On assessment, lung sounds were clear and equal in all fields, abd soft non-tender x4 quadrants, heart sounds S1, S2, (L) lower extremity warm and red circumferential below the knee. A bolus of fluid was started on the patient per orders. A televisit was started and completed by the RN and provider. Medications were administered as ordered and verified against the MAR. Abx were given after the bolus was completed. Labs were drawn as ordered. No other tasks were needed at this time. Patient is to be discharged tonight during second visit. No other tasks are needed at this time.

## 2024-01-11 NOTE — Progress Notes (Signed)
 Current health alarm for skin temp of 99.4 at 02:06 am. Several phone calls made to patient's daughter who requested we call her first after bedtime but no answer and voicemail is full. Phone call attempt also made to patient's cell phone number with no answer. Skin temp currently reading at 98.3. Will continue to monitor.

## 2024-01-11 NOTE — Plan of Care (Signed)
 Problem: Education: Goal: Knowledge of General Education information will improve Description: Including pain rating scale, medication(s)/side effects and non-pharmacologic comfort measures 01/11/2024 1816 by Timithy Arons, Shanda GAILS, RN Outcome: Progressing 01/11/2024 1816 by Babyboy Loya, Shanda GAILS, RN Outcome: Progressing   Problem: Health Behavior/Discharge Planning: Goal: Ability to manage health-related needs will improve 01/11/2024 1816 by Camelle Henkels, Shanda GAILS, RN Outcome: Progressing 01/11/2024 1816 by Fidelia Cathers, Shanda GAILS, RN Outcome: Progressing   Problem: Clinical Measurements: Goal: Ability to maintain clinical measurements within normal limits will improve 01/11/2024 1816 by Falesha Schommer, Shanda GAILS, RN Outcome: Progressing 01/11/2024 1816 by Maryssa Giampietro, Shanda GAILS, RN Outcome: Progressing Goal: Will remain free from infection 01/11/2024 1816 by Hermann Dottavio, Shanda GAILS, RN Outcome: Progressing 01/11/2024 1816 by Tiffaney Heimann, Shanda GAILS, RN Outcome: Progressing Goal: Diagnostic test results will improve 01/11/2024 1816 by Manha Amato, Shanda GAILS, RN Outcome: Progressing 01/11/2024 1816 by Talon Regala, Shanda GAILS, RN Outcome: Progressing Goal: Respiratory complications will improve 01/11/2024 1816 by Chesky Heyer, Shanda GAILS, RN Outcome: Progressing 01/11/2024 1816 by Kenzie Flakes, Shanda GAILS, RN Outcome: Progressing Goal: Cardiovascular complication will be avoided 01/11/2024 1816 by Daishawn Lauf, Shanda GAILS, RN Outcome: Progressing 01/11/2024 1816 by Katelynne Revak, Shanda GAILS, RN Outcome: Progressing   Problem: Activity: Goal: Risk for activity intolerance will decrease 01/11/2024 1816 by Darelle Kings, Shanda GAILS, RN Outcome: Progressing 01/11/2024 1816 by Vanya Carberry, Shanda GAILS, RN Outcome: Progressing   Problem: Nutrition: Goal: Adequate nutrition will be maintained 01/11/2024 1816 by Jakerria Kingbird, Shanda GAILS, RN Outcome: Progressing 01/11/2024 1816 by Kenetha Cozza, Shanda GAILS, RN Outcome: Progressing   Problem: Coping: Goal: Level of anxiety will  decrease 01/11/2024 1816 by Nesha Counihan, Shanda GAILS, RN Outcome: Progressing 01/11/2024 1816 by Amelia Macken, Shanda GAILS, RN Outcome: Progressing   Problem: Elimination: Goal: Will not experience complications related to bowel motility 01/11/2024 1816 by Navneet Schmuck, Shanda GAILS, RN Outcome: Progressing 01/11/2024 1816 by Clarion Mooneyhan, Shanda GAILS, RN Outcome: Progressing Goal: Will not experience complications related to urinary retention 01/11/2024 1816 by Mellissa Conley, Shanda GAILS, RN Outcome: Progressing 01/11/2024 1816 by Teira Arcilla, Shanda GAILS, RN Outcome: Progressing   Problem: Pain Managment: Goal: General experience of comfort will improve and/or be controlled 01/11/2024 1816 by Frederick Marro, Shanda GAILS, RN Outcome: Progressing 01/11/2024 1816 by Edythe Riches, Shanda GAILS, RN Outcome: Progressing   Problem: Safety: Goal: Ability to remain free from injury will improve 01/11/2024 1816 by Camelia Stelzner, Shanda GAILS, RN Outcome: Progressing 01/11/2024 1816 by Toniqua Melamed, Shanda GAILS, RN Outcome: Progressing   Problem: Skin Integrity: Goal: Risk for impaired skin integrity will decrease 01/11/2024 1816 by Anthonio Mizzell, Shanda GAILS, RN Outcome: Progressing 01/11/2024 1816 by Eustolia Drennen, Shanda GAILS, RN Outcome: Progressing   Problem: Clinical Measurements: Goal: Ability to avoid or minimize complications of infection will improve 01/11/2024 1816 by Mance Vallejo, Shanda GAILS, RN Outcome: Progressing 01/11/2024 1816 by Aria Jarrard, Shanda GAILS, RN Outcome: Progressing   Problem: Skin Integrity: Goal: Skin integrity will improve 01/11/2024 1816 by Rodriques Badie, Shanda GAILS, RN Outcome: Progressing 01/11/2024 1816 by Suvan Stcyr, Shanda GAILS, RN Outcome: Progressing   Problem: Education: Goal: Ability to describe self-care measures that may prevent or decrease complications (Diabetes Survival Skills Education) will improve 01/11/2024 1816 by Breslyn Abdo, Shanda GAILS, RN Outcome: Progressing 01/11/2024 1816 by Ashlin Hidalgo, Shanda GAILS, RN Outcome: Progressing Goal: Individualized  Educational Video(s) 01/11/2024 1816 by Abria Vannostrand, Shanda GAILS, RN Outcome: Progressing 01/11/2024 1816 by Adabella Stanis, Shanda GAILS, RN Outcome: Progressing   Problem: Coping: Goal: Ability to adjust to condition or change in health will improve 01/11/2024 1816 by Jaaliyah Lucatero, Shanda GAILS, RN Outcome: Progressing 01/11/2024 1816 by Herbie Lehrmann, Shanda GAILS, RN Outcome: Progressing  Problem: Fluid Volume: Goal: Ability to maintain a balanced intake and output will improve 01/11/2024 1816 by Kimble Hitchens, Shanda GAILS, RN Outcome: Progressing 01/11/2024 1816 by Iniya Matzek, Shanda GAILS, RN Outcome: Progressing   Problem: Health Behavior/Discharge Planning: Goal: Ability to identify and utilize available resources and services will improve 01/11/2024 1816 by Latanya Hemmer, Shanda GAILS, RN Outcome: Progressing 01/11/2024 1816 by Lorain Keast, Shanda GAILS, RN Outcome: Progressing Goal: Ability to manage health-related needs will improve 01/11/2024 1816 by Rebekkah Powless, Shanda GAILS, RN Outcome: Progressing 01/11/2024 1816 by Shyia Fillingim, Shanda GAILS, RN Outcome: Progressing   Problem: Metabolic: Goal: Ability to maintain appropriate glucose levels will improve 01/11/2024 1816 by Yacine Droz, Shanda GAILS, RN Outcome: Progressing 01/11/2024 1816 by Romon Devereux, Shanda GAILS, RN Outcome: Progressing   Problem: Nutritional: Goal: Maintenance of adequate nutrition will improve 01/11/2024 1816 by Princesa Willig, Shanda GAILS, RN Outcome: Progressing 01/11/2024 1816 by Angeles Zehner, Shanda GAILS, RN Outcome: Progressing Goal: Progress toward achieving an optimal weight will improve 01/11/2024 1816 by Tiwanda Threats, Shanda GAILS, RN Outcome: Progressing 01/11/2024 1816 by Akari Crysler, Shanda GAILS, RN Outcome: Progressing   Problem: Skin Integrity: Goal: Risk for impaired skin integrity will decrease 01/11/2024 1816 by Eloni Darius, Shanda GAILS, RN Outcome: Progressing 01/11/2024 1816 by Rulon Abdalla, Shanda GAILS, RN Outcome: Progressing   Problem: Tissue Perfusion: Goal: Adequacy of tissue perfusion will  improve 01/11/2024 1816 by Eimi Viney, Shanda GAILS, RN Outcome: Progressing 01/11/2024 1816 by Deondria Puryear, Shanda GAILS, RN Outcome: Progressing

## 2024-01-11 NOTE — Progress Notes (Signed)
 Communicated with patient on the video tablet. She states she slept well last night. Patient reports 6/10 left leg pain, tylenol  given. Patient reports loose stools this morning. No other questions or concerns at this time. Plan for paramedic visit between [phone] today.

## 2024-01-11 NOTE — Progress Notes (Signed)
 Writer went out to see patient this evening for discharge visit. Vitals were obtained and updated. Discharge paperwork was gone over with patient and daughter. IV was discontinued. Medications were administered with oversight from RN. TOC meds were transferred to patient. Neither patient or daughter had any questions pertaining to medications or discharge. H@H  equipment and CH devices were packaged up to include binder, medication bins, IV pole, Alaris pump, and nebulizer. No other tasks were needed for this visit.

## 2024-01-11 NOTE — Discharge Summary (Signed)
 Physician Discharge Summary   Patient: Briana Ochoa MRN: [id number] DOB: [birth year]  Admit date:     01/05/2024  Discharge date: 01/11/24  Discharge Physician: Dr. Sherre   PCP: [name], NP   Recommendations at discharge:   Linezolid 600 mg p.o. twice daily and cephalexin  500 mg p.o. twice daily to complete a 7-day course been prescribed on discharge. Please maintain and keep your outpatient oncology appointment on 12/8. Please follow-up with primary care provider within 2 weeks of discharge. Maintain a healthy balanced diet that is low in sugar and processed foods in order to encourage wound healing.  Eat a balanced diet with lots of vegetables that are colorful including beets, sprouts, yellow squash and sweet potatoes with baked proteins such as salmon and chicken breast. Keep your feet elevated when not walking or standing. Keep your skin moisturized twice a day.  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 *  Hospital Course:  Briana Ochoa is 83 year old with history of multiple primary cancers to include melanoma, breast cancer, bladder cancer, colon cancer, now undergoing treatment for non-small cell lung cancer who has left lower extremity presenting cellulitis.  This has been treated by Doxy x 2 both times has gotten somewhat better but then recurs.  The patient reports several day history of increasing swelling edema, erythema, the patient has previously had 2 negative DVT studies in the past.  In the ED she was noted to have no  significant white count, no fever, some elevation of her AST and alk phos as well as a acute on chronic kidney injury.    Pt was admitted to the hospital on 01/05/24 for IV antibiotics for left lower extremity cellulitis requiring IV antibiotics, having failed outpatient therapy.  11/30 - transferred to [facility] program to continue IV antibiotics pending further improvement 12/1 - pt's left leg slowly improving, has persistent warmth, erythema with early receding, tenderness and pain ongoing.  Continue on IV Rocephin .  IV Zyvox >> PO Zyvox for evening and remainder of course. Day 4 antibiotics 12/2 - persistent LLE erythema, today extends up the posterior leg to above the knee, ongoing pain/tenderness.   Day 5 antibiotics: IV Rocephin , PO Zyvox  12/3: I assumed care of the patient. day 6 of IV abx. Increased loperamide to 4 mg BID as needed for diarrhea, loose stools. DME nebulizer machine ordered. Discussed diet to encourage healthy skin, colorful vegetables, fresh fruit, baked protein such as salmon, chicken breast. Discussed avoiding tobacco use, alcohol, and decreasing-eliminating sugar and/or processed food from her diet. Discussed keeping skin moisturized BID.  12/4: Day 7 of IV abx. sCr noted to be increased on prior day labs. NS 250 ml bolus ordered, recheck BMP.  Assessment and Plan:  * Lower extremity cellulitis Presented with erythema, edema, pain of the left lower extremity.  Treated with 2 courses of doxycycline  as outpatient. Started on IV ceftriaxone  and vancomycin on admission. Vancomycin >> Zyvox on 11/30. Leukocytosis improving. DVT ruled out on 11/30 12/2 - persistent pain, tenderness,  erythema improved since admission but with some extension reported up the posterior leg to above knee Day 5 antibiotics, Continue IV Rocephin  and PO Zyvox, Follow blood cultures - neg x 4 days  12/3: day 6 of antibiotics. Discussed diet to encourage healthy skin, colorful vegetables,  fresh fruit, baked protein such as salmon, chicken breast. Discussed avoiding tobacco use, alcohol, and decreasing-eliminating sugar and/or processed food from her diet. Discussed moisturizing her skin after drying it after a shower. Encourage keeping skin moisturized twice daily with basic skin lotion. Patient/daughter at patient's side, states pt has eucerin lotion.  This is patient's third episode of cellulitis and pt and daughter concerned that it is recurring after chemo therapy treatment. She has next chemo tx on Monday, 12/8. At this time, I will plan for 12-14 course total of abx treatment on discharge. Plan for discharge tomorrow after receiving day 7 of IV abx.   12/4: day 7 of abx. Plan will be to discharge with cephalexin  500 mg BID PO starting 12/5 for 7 days to complete a 14 day course as this is patient's 3rd episode of cellulitis since October 2025.  Elevated serum creatinine NS 250 ml bolus ordered Recheck BMP in the AM  Stage 3b chronic kidney disease (HCC) Baseline creatinine ~1.4. Cr trend:  1.46 >> 1.71 (given gentle IV fluids in hospital) >> 1.49 >> 1.28 >> 1.52 >> 1.48 today. 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 Pt gets B12 injections.  History of DVT of lower extremity Negative LLE Doppler on 11/30. No longer on anticoagulation Monitor, Ambulate as tolerated Consider low dose Eliquis  for DVT prophylaxis given pt at risk with malignancy.  Hyperlipidemia associated with type 2 diabetes mellitus (HCC) Continue pravastatin  20 mg  every evening  History of tobacco abuse Patient quit smoking about 20 years ago.  Cancer of left colon (HCC) No current evidence of recurrence        Pain control - [place]  Controlled Substance Reporting System database was reviewed. and patient was instructed, not to drive, operate heavy machinery, perform activities at heights, swimming or participation in water activities or provide baby-sitting services while on Pain, Sleep and Anxiety Medications; until their outpatient Physician has advised to do so again. Also recommended to not to take more than prescribed Pain, Sleep and Anxiety Medications.  Consultants: PT, OT, Pharmacy, TOC Procedures performed: none  Disposition: Home Diet recommendation:  Discharge Diet Orders (From admission, onward)     Start     Ordered   01/11/24 0000  Diet - low sodium heart healthy        01/11/24 1404   01/11/24 0000  Diet general       Comments: I encouraged a healthy balanced diet that is low in processed food low in sugar and high in protein with healthy vegetable fibers. I recommend a colorful vegetable diet with roasted beets, sprouts, zucchini, yellow squash and such protein as baked salmon and baked chicken breast.   01/11/24 1404           Regular diet DISCHARGE MEDICATION: Allergies as of 01/11/2024  Reactions   Feraheme  [ferumoxytol ] Shortness Of Breath, Palpitations   Flushed face; palpitations, SOB        Medication List     STOP taking these medications    prochlorperazine  10 MG tablet Commonly known as: COMPAZINE        TAKE these medications    acetaminophen  500 MG tablet Commonly known as: TYLENOL  Take 1,000 mg by mouth daily.   aspirin  EC 81 MG tablet Take 81 mg by mouth daily. Swallow whole.   cephALEXin  500 MG capsule Commonly known as: KEFLEX  Take 1 capsule (500 mg total) by mouth 2 (two) times daily for 7 days. Start taking on: January 12, 2024   folic acid  1 MG tablet Commonly  known as: FOLVITE  Take 1 tablet (1 mg total) by mouth daily. Start 7 days before pemetrexed  chemotherapy. Continue until 21 days after pemetrexed  completed.   glucose blood test strip Commonly known as: True Metrix Blood Glucose Test Use as instructed to test blood sugar once daily E08.638   guaiFENesin-dextromethorphan 100-10 MG/5ML syrup Commonly known as: ROBITUSSIN DM Take 5 mLs by mouth every 6 (six) hours as needed for cough.   ipratropium-albuterol 0.5-2.5 (3) MG/3ML Soln Commonly known as: DUONEB Take 3 mLs by nebulization every 6 (six) hours as needed.   linezolid 600 MG tablet Commonly known as: ZYVOX Take 1 tablet (600 mg total) by mouth every 12 (twelve) hours for 7 days. Take first dose starting this evening, 01/11/24.   lisinopril  20 MG tablet Commonly known as: ZESTRIL  Take 1 tablet (20 mg total) by mouth daily.   loperamide 2 MG capsule Commonly known as: IMODIUM Take 2 capsules (4 mg total) by mouth as needed for up to 8 days for diarrhea or loose stools.   mupirocin  ointment 2 % Commonly known as: BACTROBAN  Apply 1 Application topically 2 (two) times daily. Apply with dressing change   ondansetron  4 MG disintegrating tablet Commonly known as: ZOFRAN -ODT Take 1 tablet (4 mg total) by mouth every 8 (eight) hours as needed for up to 8 days for nausea or vomiting.   ondansetron  8 MG tablet Commonly known as: Zofran  Take 1 tablet (8 mg total) by mouth every 8 (eight) hours as needed for nausea or vomiting. Start on the third day after carboplatin.   pravastatin  20 MG tablet Commonly known as: PRAVACHOL  Take 1 tablet (20 mg total) by mouth every evening.   sodium chloride  0.65 % Soln nasal spray Commonly known as: OCEAN Place 1 spray into both nostrils as needed for congestion.               Durable Medical Equipment  (From admission, onward)           Start     Ordered   01/10/24 1011  For home use only DME Nebulizer machine  Once        Question Answer Comment  Patient needs a nebulizer to treat with the following condition Shortness of breath   Patient needs a nebulizer to treat with the following condition Coughing   Length of Need 6 Months   Additional equipment included Administration kit      01/10/24 1010            Contact information for follow-up providers     Nche, Roselie Rockford, NP Follow up.   Specialty: Internal Medicine Contact information: [address] [place] [address] [phone]              Contact information for  after-discharge care     Home Medical Care     [facility]) .   Service: Home Health Services Why: [facility] will provide home health PT services, start of care within 48 hours post discharge Contact information: [address] [facility] [place]  [address] [phone]                    Discharge Exam: Fredricka Weights   01/05/24 1329 01/07/24 1553  Weight: 87.1 kg 87.4 kg   Physical Exam is performed with assistance from medic, Ezzard Cypher.  Physical Exam Constitutional:      Appearance: Normal appearance. She is obese.  HENT:     Head: Normocephalic and atraumatic.     Right Ear: External ear normal.     Left Ear: External ear normal.     Mouth/Throat:     Mouth: Mucous membranes are moist.  Eyes:     Extraocular Movements: Extraocular movements intact.     Pupils: Pupils are equal, round, and reactive to light.  Cardiovascular:     Rate and Rhythm: Normal rate and regular rhythm.     Heart sounds: No murmur heard. Pulmonary:     Effort: Pulmonary effort is normal.     Breath sounds: Normal breath sounds. No stridor. No wheezing.  Abdominal:     General: Bowel sounds are normal.     Palpations: Abdomen is soft.     Tenderness: There is no abdominal tenderness.  Musculoskeletal:        General: Swelling present. Normal range of motion.     Cervical back: Normal range of  motion and neck supple.  Skin:    Capillary Refill: Capillary refill takes less than 2 seconds.     Comments: Left lower extremity swelling and redness is improved compared to 01/10/24  Neurological:     General: No focal deficit present.     Mental Status: She is alert and oriented to person, place, and time. Mental status is at baseline.  Psychiatric:        Mood and Affect: Mood normal.        Thought Content: Thought content normal.   Condition at discharge: good  The results of significant diagnostics from this hospitalization (including imaging, microbiology, ancillary and laboratory) are listed below for reference.   Imaging Studies: VAS US  LOWER EXTREMITY VENOUS (DVT) Result Date: 01/07/2024  Lower Venous DVT Study Patient Name:  MARLAYA TURCK  Date of Exam:   01/07/2024 Medical Rec #: [id number]            Accession #:    [id number] Date of Birth: 1940/11/11             Patient Gender: F Patient Age:   48 years Exam Location:  [facility] Procedure:      VAS US  LOWER EXTREMITY VENOUS (DVT) Referring Phys: PROSPER AMPONSAH --------------------------------------------------------------------------------  Indications: Pain.  Risk Factors: None identified. Limitations: Poor ultrasound/tissue interface and patient pain tolerance. Comparison Study: No prior studies. Performing Technologist: Cordella Collet RVT  Examination Guidelines: A complete evaluation includes B-mode imaging, spectral Doppler, color Doppler, and power Doppler as needed of all accessible portions of each vessel. Bilateral testing is considered an integral part of a complete examination. Limited examinations for reoccurring indications may be performed as noted. The reflux portion of the exam is performed with the patient in reverse Trendelenburg.  +-----+---------------+---------+-----------+----------+--------------+ RIGHTCompressibilityPhasicitySpontaneityPropertiesThrombus Aging  +-----+---------------+---------+-----------+----------+--------------+ CFV  Full  Yes      Yes                                 +-----+---------------+---------+-----------+----------+--------------+   +---------+---------------+---------+-----------+----------+-------------------+ LEFT     CompressibilityPhasicitySpontaneityPropertiesThrombus Aging      +---------+---------------+---------+-----------+----------+-------------------+ CFV      Full           Yes      Yes                                      +---------+---------------+---------+-----------+----------+-------------------+ SFJ      Full                                                             +---------+---------------+---------+-----------+----------+-------------------+ FV Prox  Full                                                             +---------+---------------+---------+-----------+----------+-------------------+ FV Mid   Full                                                             +---------+---------------+---------+-----------+----------+-------------------+ FV DistalFull                                                             +---------+---------------+---------+-----------+----------+-------------------+ PFV      Full                                                             +---------+---------------+---------+-----------+----------+-------------------+ POP      Full           Yes      Yes                                      +---------+---------------+---------+-----------+----------+-------------------+ PTV      Full                                                             +---------+---------------+---------+-----------+----------+-------------------+ PERO  Not well visualized +---------+---------------+---------+-----------+----------+-------------------+    Summary: RIGHT: - No  evidence of common femoral vein obstruction.   LEFT: - There is no evidence of deep vein thrombosis in the lower extremity. However, portions of this examination were limited- see technologist comments above.  - No cystic structure found in the popliteal fossa.  *See table(s) above for measurements and observations. Electronically signed by Debby Robertson on 01/07/2024 at 11:06:57 AM.    Final    VAS US  LOWER EXTREMITY VENOUS (DVT) Result Date: 12/27/2023  Lower Venous DVT Study Patient Name:  SHAWNTINA DIFFEE  Date of Exam:   12/27/2023 Medical Rec #: [id number]            Accession #:    [id number] Date of Birth: January 08, 1941             Patient Gender: F Patient Age:   44 years Exam Location:  [address] Procedure:      VAS US  LOWER EXTREMITY VENOUS (DVT) Referring Phys: ONITA MATTOCK --------------------------------------------------------------------------------  Indications: Swelling, and Pain.  Risk Factors: Cancer breast, bladder, left colon DVT Right gastrocnemius veins chronic venous insufficiency. Comparison Study: 11/28/2023: LLEV negative for DVT. Performing Technologist: Stoney Ross RVT  Examination Guidelines: A complete evaluation includes B-mode imaging, spectral Doppler, color Doppler, and power Doppler as needed of all accessible portions of each vessel. Bilateral testing is considered an integral part of a complete examination. Limited examinations for reoccurring indications may be performed as noted. The reflux portion of the exam is performed with the patient in reverse Trendelenburg.  +-----+---------------+---------+-----------+----------+--------------+ RIGHTCompressibilityPhasicitySpontaneityPropertiesThrombus Aging +-----+---------------+---------+-----------+----------+--------------+ CFV  Full           Yes      Yes                                 +-----+---------------+---------+-----------+----------+--------------+ SFJ  Full           Yes      Yes                                  +-----+---------------+---------+-----------+----------+--------------+   +---------+---------------+---------+-----------+----------+--------------+ LEFT     CompressibilityPhasicitySpontaneityPropertiesThrombus Aging +---------+---------------+---------+-----------+----------+--------------+ CFV      Full           Yes      Yes                                 +---------+---------------+---------+-----------+----------+--------------+ SFJ      Full           Yes      Yes                                 +---------+---------------+---------+-----------+----------+--------------+ FV Prox  Full                    Yes                                 +---------+---------------+---------+-----------+----------+--------------+ FV Mid   Full           Yes      Yes                                 +---------+---------------+---------+-----------+----------+--------------+  FV DistalFull           Yes      Yes                                 +---------+---------------+---------+-----------+----------+--------------+ PFV      Full                    Yes                                 +---------+---------------+---------+-----------+----------+--------------+ POP      Full           Yes      Yes                                 +---------+---------------+---------+-----------+----------+--------------+ PTV      Full                    Yes                                 +---------+---------------+---------+-----------+----------+--------------+ PERO     Full                    Yes                                 +---------+---------------+---------+-----------+----------+--------------+ GSV      Full                    Yes                                 +---------+---------------+---------+-----------+----------+--------------+  Summary: RIGHT: - No evidence of common femoral vein obstruction.  LEFT: - There is no evidence of deep vein  thrombosis in the lower extremity. - There is no evidence of superficial venous thrombosis.  - No cystic structure found in the popliteal fossa.  *See table(s) above for measurements and observations. Electronically signed by Lonni Gaskins MD on 12/27/2023 at 4:30:30 PM.    Final     Microbiology: Results for orders placed or performed during the hospital encounter of 01/05/24  Blood culture (routine x 2)     Status: None   Collection Time: 01/05/24  3:03 PM   Specimen: BLOOD LEFT ARM  Result Value Ref Range Status   Specimen Description   Final    BLOOD LEFT ARM Performed at [facility], [address] [place], [place] [address]    Special Requests   Final    BOTTLES DRAWN AEROBIC AND ANAEROBIC Blood Culture results may not be optimal due to an inadequate volume of blood received in culture bottles Performed at [facility], [address] [place], [place] [address]    Culture   Final    NO GROWTH 5 DAYS Performed at [facility], [address], [place], [place] [address]    Report Status 01/10/2024 FINAL  Final  Resp panel by RT-PCR (RSV, Flu A&B, Covid) Anterior Nasal Swab     Status: None   Collection Time: 01/05/24  3:14  PM   Specimen: Anterior Nasal Swab  Result Value Ref Range Status   SARS Coronavirus 2 by RT PCR NEGATIVE NEGATIVE Final    Comment: (NOTE) SARS-CoV-2 target nucleic acids are NOT DETECTED.  The SARS-CoV-2 RNA is generally detectable in upper respiratory specimens during the acute phase of infection. The lowest concentration of SARS-CoV-2 viral copies this assay can detect is 138 copies/mL. A negative result does not preclude SARS-Cov-2 infection and should not be used as the sole basis for treatment or other patient management decisions. A negative result may occur with  improper specimen collection/handling, submission of specimen other than nasopharyngeal swab, presence of viral mutation(s) within the areas targeted  by this assay, and inadequate number of viral copies(<138 copies/mL). A negative result must be combined with clinical observations, patient history, and epidemiological information. The expected result is Negative.  Fact Sheet for Patients:  bloggercourse.com  Fact Sheet for Healthcare Providers:  seriousbroker.it  This test is no t yet approved or cleared by the [place]  FDA and  has been authorized for detection and/or diagnosis of SARS-CoV-2 by FDA under an Emergency Use Authorization (EUA). This EUA will remain  in effect (meaning this test can be used) for the duration of the COVID-19 declaration under Section 564(b)(1) of the Act, 21 U.S.C.section 360bbb-3(b)(1), unless the authorization is terminated  or revoked sooner.       Influenza A by PCR NEGATIVE NEGATIVE Final   Influenza B by PCR NEGATIVE NEGATIVE Final    Comment: (NOTE) The Xpert Xpress SARS-CoV-2/FLU/RSV plus assay is intended as an aid in the diagnosis of influenza from Nasopharyngeal swab specimens and should not be used as a sole basis for treatment. Nasal washings and aspirates are unacceptable for Xpert Xpress SARS-CoV-2/FLU/RSV testing.  Fact Sheet for Patients: bloggercourse.com  Fact Sheet for Healthcare Providers: seriousbroker.it  This test is not yet approved or cleared by the [place]  FDA and has been authorized for detection and/or diagnosis of SARS-CoV-2 by FDA under an Emergency Use Authorization (EUA). This EUA will remain in effect (meaning this test can be used) for the duration of the COVID-19 declaration under Section 564(b)(1) of the Act, 21 U.S.C. section 360bbb-3(b)(1), unless the authorization is terminated or revoked.     Resp Syncytial Virus by PCR NEGATIVE NEGATIVE Final    Comment: (NOTE) Fact Sheet for Patients: bloggercourse.com  Fact  Sheet for Healthcare Providers: seriousbroker.it  This test is not yet approved or cleared by the [place]  FDA and has been authorized for detection and/or diagnosis of SARS-CoV-2 by FDA under an Emergency Use Authorization (EUA). This EUA will remain in effect (meaning this test can be used) for the duration of the COVID-19 declaration under Section 564(b)(1) of the Act, 21 U.S.C. section 360bbb-3(b)(1), unless the authorization is terminated or revoked.  Performed at [facility], [address], [place], [place] [address]   Blood culture (routine x 2)     Status: None   Collection Time: 01/05/24  5:19 PM   Specimen: BLOOD RIGHT HAND  Result Value Ref Range Status   Specimen Description   Final    BLOOD RIGHT HAND Performed at [facility], [address] [place], [place] [address]    Special Requests   Final    BOTTLES DRAWN AEROBIC ONLY Blood Culture adequate volume Performed at [facility], [address], [place], [place] [address]    Culture   Final    NO GROWTH 5 DAYS  Performed at [facility], [address] [place], [place] [address]    Report Status 01/10/2024 FINAL  Final    Labs: CBC: Recent Labs  Lab 01/05/24 1503 01/06/24 0535 01/07/24 0645 01/08/24 1128 01/09/24 1340 01/10/24 1333  WBC 9.0 9.9 12.5* 12.1* 11.4* 10.9*  NEUTROABS 6.2  --   --   --   --   --   HGB 12.5 10.8* 11.3* 11.1* 10.3* 11.5*  HCT 38.6 32.9* 35.1* 34.2* 31.9* 36.0  MCV 96.7 95.6 96.7 95.5 94.9 96.5  PLT 167 184 223 268 363 247   Basic Metabolic Panel: Recent Labs  Lab 01/06/24 0535 01/07/24 0645 01/08/24 1128 01/09/24 1340 01/10/24 1032  NA 135 133* 136 136 137  K 4.8 4.8 4.4 4.8 5.0  CL 103 102 98 105 100  CO2 22 20* 24 24 25   GLUCOSE 149* 148* 190* 136* 167*  BUN 32* 26* 25* 26* 21  CREATININE 1.49* 1.28* 1.52* 1.48* 1.68*  CALCIUM  9.2 9.8 9.0 8.6* 8.5*   Liver Function  Tests: Recent Labs  Lab 01/05/24 1503  AST 60*  ALT 27  ALKPHOS 141*  BILITOT 0.6  PROT 7.4  ALBUMIN  3.3*   CBG: Recent Labs  Lab 01/06/24 1133 01/06/24 1647 01/06/24 2307 01/07/24 0735 01/07/24 1129  GLUCAP 148* 99 131* 141* 143*   Discharge time spent: greater than 30 minutes.  Signed: Dr. Sherre  Location: [place]  [facility] 01/11/2024

## 2024-01-11 NOTE — Progress Notes (Signed)
 Communicated with patient via video tablet. Patient appears comfortable on the chair. AVS paperwork given to the patient. Discharge instructions discussed with patient. Patient's question answered to satisfaction. Patient agreeable with discharge plan.

## 2024-01-11 NOTE — Assessment & Plan Note (Signed)
 NS 250 ml bolus ordered Recheck BMP in the AM

## 2024-01-12 ENCOUNTER — Other Ambulatory Visit: Payer: Self-pay

## 2024-01-12 ENCOUNTER — Telehealth: Payer: Self-pay

## 2024-01-12 NOTE — Transitions of Care (Post Inpatient/ED Visit) (Signed)
   01/12/2024  Name: GWENDLOYN FORSEE MRN: [id number] DOB: 1941-01-06  Today's TOC FU Call Status: Today's TOC FU Call Status:: Unsuccessful Call (1st Attempt) Unsuccessful Call (1st Attempt) Date: 01/12/24  Attempted to reach the patient regarding the most recent Inpatient/ED visit.  Unable to leave a  HIPAA approved voicemail message to phone number provided in demographics per DPR due to mailbox is full.    Follow Up Plan: Additional outreach attempts will be made to reach the patient to complete the Transitions of Care (Post Inpatient/ED visit) call.   Richerd Fish, RN, BSN, CCM [facility], [facility] Management Coordinator Direct Dial: [phone]

## 2024-01-14 NOTE — Progress Notes (Unsigned)
 [facility]   [phone] [phone]   Clinic Follow up Note   Patient Care Team: Nche, Roselie Rockford, NP as PCP - General (Internal Medicine) Odean Potts, MD as Consulting Physician (Hematology and Oncology) Rosemarie Eather RAMAN, MD as Consulting Physician (Neurology) Lanis Pupa, MD as Consulting Physician (Neurosurgery) Charmayne Molly, MD as Consulting Physician (Ophthalmology) Lanny Callander, MD as Consulting Physician (Hematology and Oncology) Debby Hila, MD as Consulting Physician (General Surgery) Jackquline Dais, EMT as Paramedic  Date of Service:  01/15/2024  CHIEF COMPLAINT: f/u of metastatic lung adenocarcinoma  CURRENT THERAPY:  First-line chemotherapy Alimta  and Keytruda  every 3 weeks  Oncology History   NSCLC metastatic to bone [facility]) -Diagnosed in 09/2023, stage IV with bone mets, KRAS G12V mutation (+), TPS 10% cps 12%, NGS TEMPUS result pending  -.  During her CT surveillance for her colon cancer in 2022, she was found to have multiple lung nodules.  The left upper lung nodule size slightly increased in 2023, she underwent bronchoscopy biopsy which was negative.  Due to the further increase in the size in 2025, she had a repeated bronchoscopy biopsy in August 2025, which confirmed adenocarcinoma.  The immunostain studies do not support colorectal primary, and a favor lung or upper GI primary.  EGD and colonoscopy were negative for malignancy. -PET scan in September 2025 unfortunately showed a oligo bone metastasis in right scapula.  She was referred to see thoracic surgeon Dr. Kerrin, surgery was not offered due to the metastatic disease. -Guardant360 was negative for targetable mutation -I recommend chemotherapy with single agent pemetrexed , and Keytruda , her  PD-L1 is positive   Assessment & Plan Metastatic non-small cell lung cancer Currently undergoing chemotherapy and immunotherapy. Recent chemotherapy cycle was missed due  to lower extremity cellulitis which flared up after last cycle chemo. Immunotherapy alone today is considered as an alternative due to infection risk associated with chemotherapy. Awaiting results from molecular testing to guide treatment decisions. - Patient prefer to deferr chemotherapy and immunotherapy treatment today and will reassess in three weeks. - Will follow up on molecular testing results to guide future treatment decisions.  Left lower extremity cellulitis Severe cellulitis of the lower extremity, initially treated with IV antibiotics and currently on oral antibiotics. Significant improvement noted, but still experiencing pain and swelling. Chemotherapy may have exacerbated the infection. - Continue current oral antibiotics (Keyflux and Zyvox ) until completion. - Elevate legs to reduce swelling. - Monitor for signs of infection resolution.  Chemotherapy-induced immunosuppression Chemotherapy has led to immunosuppression, contributing to the development of cellulitis. Decision made to hold chemotherapy to allow recovery and prevent further infections. - Held chemotherapy treatment to allow recovery from cellulitis.  Dyspnea and wheezing Intermittent dyspnea and wheezing, possibly related to infection. Nebulizer treatment provided as needed, with improvement noted as infection resolves. - Use nebulizer as needed for dyspnea and wheezing.  Plan - Due to her recent hospital admission for left lower extremity cellulitis, which flared after last cycle chemo, and she is still on oral antibiotics, will hold treatment today - She will return in 3 weeks to restart treatment    SUMMARY OF ONCOLOGIC HISTORY: Oncology History Overview Note  Cancer Staging Cancer of left colon [facility]) Staging form: Colon and Rectum, AJCC 8th Edition - Pathologic stage from 05/28/2020: Stage IIA (pT3, pN0, cM0) - Signed by Lanny Callander, MD on 06/19/2020 Stage prefix: Initial diagnosis Total positive nodes:  0 Histologic grading system: 4 grade system Histologic grade (G): G3 Residual tumor (R):  R0 - None  Ductal carcinoma in situ (DCIS) of left breast Staging form: Breast, AJCC 7th Edition - Clinical: Stage 0 (Tis (DCIS), N0, cM0) - Unsigned Specimen type: Core Needle Biopsy Histopathologic type: 9932 Laterality: Left Staging comments: Staged at breast conference 7.22.15  - Pathologic: Stage 0 (Tis (DCIS)(2), N0, cM0) - Unsigned Specimen type: Core Needle Biopsy Histopathologic type: 9932 Laterality: Left Tumor size (mm): 3 Multiple tumors: Yes Number of tumors: 2 Method of lymph node assessment: Clinical Method of detection of distant metastases: Clinical Residual tumor (R): R0 - None Estrogen receptor status: Positive Progesterone receptor status: Positive    Ductal carcinoma in situ (DCIS) of left breast  08/20/2013 Initial Diagnosis   Left breast 12:00: DCIS with calcifications, yet 100%, PR 100%; 09/04/2013 second group of calcifications also biopsied proven to be DCIS ER/PR positive   09/19/2013 Surgery   Left breast lumpectomy DCIS 2 foci margins negative, 0.2 and 0.3 cm ER 100% PR 100%   10/08/2013 - 11/12/2013 Radiation Therapy   Adjuvant radiation therapy   11/12/2013 - 2020 Anti-estrogen oral therapy   Anastrozole  1 mg daily plan is for 5 years   01/09/2014 - 01/10/2014 Hospital Admission   Pipeline embolization 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 The report date is 07/30/2020.   The CancerNext-Expanded + RNAinsight gene panel offered by [employer] and includes sequencing and rearrangement analysis for the following 77 genes: AIP, ALK, APC, ATM, AXIN2, BAP1, BARD1, BLM, BMPR1A, BRCA1, BRCA2, BRIP1, CDC73, CDH1, CDK4, CDKN1B, CDKN2A, CHEK2, CTNNA1, DICER1, FANCC, FH, FLCN, GALNT12, KIF1B, LZTR1, MAX, MEN1, MET, MLH1, MSH2, MSH3, MSH6,  MUTYH, NBN, NF1, NF2, NTHL1, PALB2, PHOX2B, PMS2, POT1, PRKAR1A, PTCH1, PTEN, RAD51C, RAD51D, RB1, RECQL, RET, SDHA, SDHAF2, SDHB, SDHC, SDHD, SMAD4, SMARCA4, SMARCB1, SMARCE1, STK11, SUFU, TMEM127, TP53, TSC1, TSC2, VHL and XRCC2 (sequencing and deletion/duplication); EGFR, EGLN1, HOXB13, KIT, MITF, PDGFRA, POLD1 and POLE (sequencing only); EPCAM and GREM1 (deletion/duplication only). RNA data is routinely analyzed for use in variant interpretation for all genes.   Bladder cancer (HCC)  02/09/2018 Initial Diagnosis   Bladder cancer (HCC)   07/30/2020 Genetic Testing   Negative genetic testing:  No pathogenic variants detected on the [employer] CancerNext-Expanded + RNAinsight panel. The report date is 07/30/2020.   The CancerNext-Expanded + RNAinsight gene panel offered by [employer] and includes sequencing and rearrangement analysis for the following 77 genes: AIP, ALK, APC, ATM, AXIN2, BAP1, BARD1, BLM, BMPR1A, BRCA1, BRCA2, BRIP1, CDC73, CDH1, CDK4, CDKN1B, CDKN2A, CHEK2, CTNNA1, DICER1, FANCC, FH, FLCN, GALNT12, KIF1B, LZTR1, MAX, MEN1, MET, MLH1, MSH2, MSH3, MSH6, MUTYH, NBN, NF1, NF2, NTHL1, PALB2, PHOX2B, PMS2, POT1, PRKAR1A, PTCH1, PTEN, RAD51C, RAD51D, RB1, RECQL, RET, SDHA, SDHAF2, SDHB, SDHC, SDHD, SMAD4, SMARCA4, SMARCB1, SMARCE1, STK11, SUFU, TMEM127, TP53, TSC1, TSC2, VHL and XRCC2 (sequencing and deletion/duplication); EGFR, EGLN1, HOXB13, KIT, MITF, PDGFRA, POLD1 and POLE (sequencing only); EPCAM and GREM1 (deletion/duplication only). RNA data is routinely analyzed for use in variant interpretation for all genes.   08/31/2020 Imaging   CT CAP w/o contrast  IMPRESSION: 1. No specific findings of active malignancy on today's noncontrast CT. There has been interval partial colectomy involving the tumor site with reanastomosis. Some faint stranding in the adjacent omentum is probably from scarring or fat necrosis, less likely due to early tumor given the lack of overt nodularity.  Surveillance imaging is likely indicated, and PET-CT may have a  role in imaging follow up. 2. Several small pulmonary nodules, in addition to a larger mixed density nodule in the left upper lobe. Surveillance is recommended. 3. Other imaging findings of potential clinical significance: Aortic Atherosclerosis (ICD10-I70.0). Coronary atherosclerosis. Mitral valve calcification. Small type 1 hiatal hernia. Splenectomy with small amount of regenerative splenic tissue as well as a venous varix arising from the splenic vein. Hyperdense left kidney lower pole exophytic homogeneous lesion, probably a complex cyst. Sigmoid colon diverticulosis. Multilevel lumbar spondylosis and degenerative disc disease.   Cancer of left colon (HCC)  04/23/2020 Procedure   Upper Endoscopy and Colonoscopy by Dr Avram IMPRESSION Erythematous mucosa in the antrum. Biopsied. - Small hiatal hernia. - Gastroesophageal flap valve classified as Hill Grade IV (no fold, wide open lumen, hiatal hernia present). - The examination was otherwise normal.   IMPRESSION - Decreased sphincter tone found on digital rectal exam. - Malignant partially obstructing tumor in the proximal descending colon. Biopsied. Tattooed. - Three 5 to 20 mm polyps in the sigmoid colon, removed piecemeal using a cold snare. Resected and retrieved. - Severe diverticulosis in the sigmoid colon. - I left several diminutive descending (proximal to tattoo), sigmoid and rectal polyps - The examination was otherwise normal on direct and retroflexion views.     04/23/2020 Initial Diagnosis   Diagnosis 1. Descending Colon Polyp, mass - ADENOCARCINOMA. 2. Sigmoid Colon Polyp, (3) - MULTIPLE FRAGMENTS OF TUBULAR ADENOMA. - MULTIPLE FRAGMENTS OF SESSILE SERRATED POLYP WITHOUT DYSPLASIA. - NO HIGH GRADE DYSPLASIA OR MALIGNANCY. Microscopic Comment 1. Dr. Rebbecca has reviewed the case. Dr. Avram was notified on 04/24/2020. 2. Despite being left  sided the fragments have features of a sessile serrated polyp, as opposed to, hyperplastic polyp.   04/27/2020 Imaging   CT AP IMPRESSION: Bulky annular soft tissue mass involving the distal transverse colon with extension into adjacent pericolonic fat, consistent with primary colon carcinoma.   No evidence of metastatic disease.   Colonic diverticulosis. No radiographic evidence of diverticulitis.   Aortic Atherosclerosis (ICD10-I70.0).     05/05/2020 Imaging   CT Chest IMPRESSION: Bulky annular soft tissue mass involving the distal transverse colon with extension into adjacent pericolonic fat, consistent with primary colon carcinoma.   No evidence of metastatic disease.   Colonic diverticulosis. No radiographic evidence of diverticulitis.   Aortic Atherosclerosis (ICD10-I70.0).     05/28/2020 Initial Diagnosis   Cancer of left colon (HCC)   05/28/2020 Cancer Staging   Staging form: Colon and Rectum, AJCC 8th Edition - Pathologic stage from 05/28/2020: Stage IIA (pT3, pN0, cM0) - Signed by Lanny Callander, MD on 06/19/2020 Stage prefix: Initial diagnosis Total positive nodes: 0 Histologic grading system: 4 grade system Histologic grade (G): G3 Residual tumor (R): R0 - None   05/28/2020 Surgery   XI ROBOT ASSISTED RESECTION OF SPLENIC FLEXURE by Dr Debby    FINAL MICROSCOPIC DIAGNOSIS:   A. COLON, SPLENIC FLEXURE, RESECTION:  - Invasive adenocarcinoma, poorly differentiated, spanning 9.7 cm.  - Tumor invades through muscularis propria into pericolonic tissue.  - Resection margins are negative.  - Tubular adenoma (X1).  - Sessile serrate polyp without dysplasia (x2).  - Diverticulosis.  - Polypectomy scar.  - Twenty-four of twenty-four lymph nodes negative for carcinoma (0/24).  - See oncology table.    07/07/2020 Miscellaneous   Guardant Reveal ctDNA positive MSI high   07/30/2020 Genetic Testing   Negative genetic testing:  No pathogenic variants detected on the  [employer] CancerNext-Expanded + RNAinsight panel. The report date is  07/30/2020.   The CancerNext-Expanded + RNAinsight gene panel offered by [employer] and includes sequencing and rearrangement analysis for the following 77 genes: AIP, ALK, APC, ATM, AXIN2, BAP1, BARD1, BLM, BMPR1A, BRCA1, BRCA2, BRIP1, CDC73, CDH1, CDK4, CDKN1B, CDKN2A, CHEK2, CTNNA1, DICER1, FANCC, FH, FLCN, GALNT12, KIF1B, LZTR1, MAX, MEN1, MET, MLH1, MSH2, MSH3, MSH6, MUTYH, NBN, NF1, NF2, NTHL1, PALB2, PHOX2B, PMS2, POT1, PRKAR1A, PTCH1, PTEN, RAD51C, RAD51D, RB1, RECQL, RET, SDHA, SDHAF2, SDHB, SDHC, SDHD, SMAD4, SMARCA4, SMARCB1, SMARCE1, STK11, SUFU, TMEM127, TP53, TSC1, TSC2, VHL and XRCC2 (sequencing and deletion/duplication); EGFR, EGLN1, HOXB13, KIT, MITF, PDGFRA, POLD1 and POLE (sequencing only); EPCAM and GREM1 (deletion/duplication only). RNA data is routinely analyzed for use in variant interpretation for all genes.   08/31/2020 Imaging   CT CAP w/o contrast  IMPRESSION: 1. No specific findings of active malignancy on today's noncontrast CT. There has been interval partial colectomy involving the tumor site with reanastomosis. Some faint stranding in the adjacent omentum is probably from scarring or fat necrosis, less likely due to early tumor given the lack of overt nodularity. Surveillance imaging is likely indicated, and PET-CT may have a role in imaging follow up. 2. Several small pulmonary nodules, in addition to a larger mixed density nodule in the left upper lobe. Surveillance is recommended. 3. Other imaging findings of potential clinical significance: Aortic Atherosclerosis (ICD10-I70.0). Coronary atherosclerosis. Mitral valve calcification. Small type 1 hiatal hernia. Splenectomy with small amount of regenerative splenic tissue as well as a venous varix arising from the splenic vein. Hyperdense left kidney lower pole exophytic homogeneous lesion, probably a complex cyst. Sigmoid colon diverticulosis.  Multilevel lumbar spondylosis and degenerative disc disease.   10/02/2020 - 09/17/2021 Chemotherapy   Patient is on Treatment Plan : COLORECTAL Pembrolizumab  q21d     03/01/2021 Imaging   EXAM: CT CHEST, ABDOMEN AND PELVIS WITHOUT CONTRAST  IMPRESSION: 1. Mixed attenuation lesion posterior left upper lobe has areas of more confluent and enlarging nodularity today. Interval progression raises concern for neoplasm. Consider PET-CT to further evaluate. 2. Stable 7 mm right lower lobe pulmonary nodule with a slightly more conspicuous 3 mm nodule in the right lower lobe. Continued close attention on follow-up recommended. 3. No evidence for metastatic disease in the abdomen or pelvis. 4. Aortic Atherosclerosis (ICD10-I70.0).   03/17/2021 PET scan   IMPRESSION: 1. Part solid nodule of the left upper lobe demonstrates mild FDG uptake, and has increased in size when compared with prior exam dated May 05, 2020. Concerning for indolent primary lung neoplasm. 2. No evidence of metastatic disease in the chest, abdomen or pelvis. 3. 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 Recommend further evaluation with tissue sampling. 4. Aortic Atherosclerosis (ICD10-I70.0).   03/15/2022 Imaging    IMPRESSION: Stable irregular solid pulmonary nodule in posterior left upper lobe.   No evidence of recurrent or metastatic disease within the abdomen or pelvis.   Colonic diverticulosis, without radiographic evidence of diverticulitis.   NSCLC metastatic to bone [facility])  10/02/2023 Cancer Staging   Staging form: Lung, AJCC V9 - Clinical stage from 10/02/2023: Richelle Canary, cM1 - Signed by Lanny Callander, MD on 11/20/2023   11/20/2023 Initial Diagnosis   NSCLC metastatic to bone (HCC)   12/04/2023 -  Chemotherapy   Patient is on Treatment Plan : Lung Pemetrexed  (500) + Pembrolizumab  (200) D1 q21d        Discussed the use of AI scribe software for  clinical note transcription with the patient, who gave verbal  consent to proceed.  History of Present Illness Briana Ochoa is an 83 year old female with metastatic non-small cell lung cancer who presents for follow-up.  She recently underwent surgery and started chemotherapy for metastatic non-small cell lung cancer. She tolerated the first cycle with only mild fatigue but missed her most recent chemotherapy session because of a leg infection.  She developed a severe red, painful leg infection that required hospitalization from Friday to Thursday. She received two IV antibiotics. The infection began before her first chemotherapy and was initially treated with oral antibiotics, improved, then worsened after chemotherapy. She is now on oral Keyflux and Zyvox , expected to finish at the end of the week. The infection has improved, but she still has leg pain and swelling, worse with prolonged standing or walking.  She has oxygen desaturations to 91% with wheezing and shortness of breath and uses a nebulizer as needed. She can walk but does so slowly and carefully because of leg pain and swelling.     All other systems were reviewed with the patient and are negative.  MEDICAL HISTORY:  Past Medical History:  Diagnosis Date   Allergy    seasonal allergies   Anemia    on meds   Aortic atherosclerosis    Bladder cancer (HCC) 2020   Bladder tumor    Blood transfusion without reported diagnosis    Bloody diarrhea 09/29/2017   Breast cancer (HCC) 2015   Left Breast Cancer   Cataract    bilateral -sx   CKD (chronic kidney disease), stage III (HCC)    DM related   Colon cancer (HCC)    Diabetes mellitus type 2, diet-controlled (HCC)    diet controlled   Dyspnea 09/28/2017   Dysuria    Family history of adverse reaction to anesthesia    aunt- N/V    Family history of breast cancer    Family history of kidney cancer    Family history of throat cancer    Family history of uterine  cancer    Fatigue 09/28/2017   Frequency of urination    Grade I diastolic dysfunction 08/06/2014   Noted on ECHO   Gross hematuria 09/29/2017   Heart murmur    since rheumatic fever as child   Hematuria    History of cardiomegaly 02/12/2005   Mild, noted on CXR   History of CVA (cerebrovascular accident) 08/05/2014   post op cerebral angiogram, cerebral thrombosis w/ cerebral infarction;  11-02-2017  per pt no residuals   History of malignant melanoma of skin 12/2015   s/p wide local excision right lower leg (per pt localized)   History of rheumatic fever as a child    Hyperlipidemia    on meds   Hypertension    on meds   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   Sigmoid diverticulosis  Stroke [facility])    Weakness 09/28/2017   Wears dentures    upper   Wears glasses    Wears hearing aid in both ears     SURGICAL HISTORY: Past Surgical History:  Procedure Laterality Date   BREAST LUMPECTOMY Left 2015   BREAST LUMPECTOMY WITH NEEDLE LOCALIZATION Left 09/19/2013   Procedure: LEFT BREAST LUMPECTOMY WITH DOUBLE WIRE BRACKETED  NEEDLE LOCALIZATION;  Surgeon: Elon CHRISTELLA Pacini, MD;  Location: [facility] OR;  Service: General;  Laterality: Left;   BRONCHIAL BIOPSY  04/06/2021   Procedure: BRONCHIAL BIOPSIES;  Surgeon: Brenna Adine CROME, DO;  Location: [facility] ENDOSCOPY;  Service: Pulmonary;;   BRONCHIAL BRUSHINGS  04/06/2021    Procedure: BRONCHIAL BRUSHINGS;  Surgeon: Brenna Adine CROME, DO;  Location: [facility] ENDOSCOPY;  Service: Pulmonary;;   CATARACT EXTRACTION W/ INTRAOCULAR LENS  IMPLANT, BILATERAL  06/2015   CESAREAN SECTION  1964   CHOLECYSTECTOMY OPEN  1970s   CYSTOSCOPY W/ URETERAL STENT PLACEMENT Left 11/07/2017   Procedure: CYSTOSCOPY WITH RETROGRADE PYELOGRAM/URETERAL STENT PLACEMENT;  Surgeon: Sherrilee Belvie CROME, MD;  [facility];  Service: Urology;  Laterality: Left;   CYSTOSCOPY W/ URETERAL STENT PLACEMENT Bilateral 04/27/2018   Procedure: CYSTOSCOPY WITH RETROGRADE PYELOGRAM;  Surgeon: Sherrilee Belvie CROME, MD;  Location: [facility];  Service: Urology;  Laterality: Bilateral;   FINGER SURGERY     lt thumb  from dog bite   IR  NEPHROURETERAL CATH PLACE LEFT  04/28/2018   MELANOMA EXCISION Right 12/28/2015   Procedure: EXCISION MELANOMA RIGHT LOWER LEG;  Surgeon: Elon Pacini, MD;  Location: [facility];  Service: General;  Laterality: Right;   RADIOLOGY WITH ANESTHESIA N/A 01/09/2014   Procedure: Embolization;  Surgeon: Gerldine Maizes, MD;  Location: [facility] OR;  Service: Radiology;  Laterality: N/A;   SPLENECTOMY, TOTAL  2009 approx.   splen injury due to fall off horse   TONSILLECTOMY  child   TRANSURETHRAL RESECTION OF BLADDER TUMOR N/A 11/07/2017   Procedure: TRANSURETHRAL RESECTION OF BLADDER TUMOR (TURBT), POSSIBLE STENT PLACEMENT;  Surgeon: Sherrilee Belvie CROME, MD;  Location: [facility];  Service: Urology;  Laterality: N/A;   TRANSURETHRAL RESECTION OF BLADDER TUMOR N/A 12/14/2017   Procedure: TRANSURETHRAL RESECTION OF BLADDER TUMOR (TURBT);  Surgeon: Sherrilee Belvie CROME, MD;  Location[facility];  Service: Urology;  Laterality: N/A;  30 MINS   TRANSURETHRAL RESECTION OF BLADDER TUMOR N/A 04/27/2018   Procedure: TRANSURETHRAL RESECTION OF BLADDER TUMOR (TURBT);  Surgeon: Sherrilee Belvie CROME, MD;  Location: [facility];  Service: Urology;  Laterality: N/A;  30 MINS   VIDEO BRONCHOSCOPY WITH ENDOBRONCHIAL NAVIGATION Bilateral 10/02/2023   Procedure: VIDEO BRONCHOSCOPY WITH ENDOBRONCHIAL NAVIGATION;  Surgeon: Shelah Lamar RAMAN, MD;  Location: [facility] ENDOSCOPY;  Service: Pulmonary;  Laterality: Bilateral;   VIDEO BRONCHOSCOPY WITH RADIAL ENDOBRONCHIAL ULTRASOUND  04/06/2021   Procedure: RADIAL ENDOBRONCHIAL ULTRASOUND;  Surgeon: Brenna Adine CROME, DO;  Location: [facility];  Service: Pulmonary;;    I have reviewed the social history and family history with the patient and they are unchanged from previous note.  ALLERGIES:  is allergic to feraheme  [ferumoxytol ].  MEDICATIONS:  Current Outpatient Medications  Medication Sig Dispense Refill   acetaminophen  (TYLENOL ) 500 MG tablet Take 1,000 mg by mouth daily.     aspirin  EC 81 MG tablet Take 81 mg by mouth daily. Swallow whole.     cephALEXin  (KEFLEX ) 500 MG capsule Take 1 capsule (500 mg total) by mouth 2 (two)  times daily for 7 days. 14 capsule 0   folic acid  (FOLVITE ) 1 MG tablet Take 1 tablet (1 mg total) by mouth daily. Start 7 days before pemetrexed  chemotherapy. Continue until 21 days after pemetrexed  completed. 100 tablet 3   glucose blood (TRUE METRIX BLOOD GLUCOSE TEST) test strip Use as instructed to test blood sugar once daily E08.638 100 each 2   guaiFENesin -dextromethorphan  (ROBITUSSIN DM) 100-10 MG/5ML syrup Take 5 mLs by mouth every 6 (six) hours as needed for cough. 118 mL 0   ipratropium-albuterol  (DUONEB) 0.5-2.5 (3) MG/3ML SOLN Take 3 mLs by nebulization every 6 (six) hours as needed. 360 mL 1   linezolid  (ZYVOX ) 600 MG tablet Take 1 tablet (600 mg total) by mouth every 12 (twelve) hours for 7 days. Take first dose starting this evening, 01/11/24. 14 tablet 0   lisinopril  (ZESTRIL ) 20 MG tablet Take 1 tablet (20 mg total) by mouth daily. 90 tablet 3   loperamide  (IMODIUM ) 2 MG capsule Take 2 capsules (4 mg total) by mouth as needed for  up to 8 days for diarrhea or loose stools. 25 capsule 0   mupirocin  ointment (BACTROBAN ) 2 % Apply 1 Application topically 2 (two) times daily. Apply with dressing change 15 g 0   ondansetron  (ZOFRAN ) 8 MG tablet Take 1 tablet (8 mg total) by mouth every 8 (eight) hours as needed for nausea or vomiting. Start on the third day after carboplatin. 30 tablet 1   ondansetron  (ZOFRAN -ODT) 4 MG disintegrating tablet Take 1 tablet (4 mg total) by mouth every 8 (eight) hours as needed for up to 8 days for nausea or vomiting. 20 tablet 0   pravastatin  (PRAVACHOL ) 20 MG tablet Take 1 tablet (20 mg total) by mouth every evening. 90 tablet 1   sodium chloride  (OCEAN) 0.65 % SOLN nasal spray Place 1 spray into both nostrils as needed for congestion. 50 mL 0   No current facility-administered medications for this visit.    PHYSICAL EXAMINATION: ECOG PERFORMANCE STATUS: 2 - Symptomatic, <50% confined to bed  Vitals:   01/15/24 1201  BP: (!) 137/53  Pulse: 86  Resp: 17  Temp: (!) 97.3 F (36.3 C)  SpO2: 97%   Wt Readings from Last 3 Encounters:  01/15/24 193 lb 1.6 oz (87.6 kg)  01/07/24 192 lb 9.6 oz (87.4 kg)  12/26/23 192 lb (87.1 kg)     GENERAL:alert, no distress and comfortable SKIN: skin color, texture, turgor are normal, (+) diffuse skin erythema and mild edema of the lower extremity up to below knee EYES: normal, Conjunctiva are pink and non-injected, sclera clear NECK: supple, thyroid  normal size, non-tender, without nodularity LYMPH:  no palpable lymphadenopathy in the cervical, axillary  LUNGS: clear to auscultation and percussion with normal breathing effort HEART: regular rate & rhythm and no murmurs and no lower extremity edema ABDOMEN:abdomen soft, non-tender and normal bowel sounds Musculoskeletal:no cyanosis of digits and no clubbing  NEURO: alert & oriented x 3 with fluent speech, no focal motor/sensory deficits  Physical Exam    LABORATORY DATA:  I have reviewed the  data as listed    Latest Ref Rng & Units 01/15/2024   11:28 AM 01/10/2024    1:33 PM 01/09/2024    1:40 PM  CBC  WBC 4.0 - 10.5 K/uL 10.2  10.9  11.4   Hemoglobin 12.0 - 15.0 g/dL 88.5  88.4  89.6   Hematocrit 36.0 - 46.0 % 34.3  36.0  31.9   Platelets 150 -  400 K/uL 637  247  363         Latest Ref Rng & Units 01/15/2024   11:28 AM 01/11/2024    3:20 PM 01/10/2024   10:32 AM  CMP  Glucose 70 - 99 mg/dL 876  851  832   BUN 8 - 23 mg/dL 24  20  21    Creatinine 0.44 - 1.00 mg/dL 8.34  8.48  8.31   Sodium 135 - 145 mmol/L 137  138  137   Potassium 3.5 - 5.1 mmol/L 5.0  4.2  5.0   Chloride 98 - 111 mmol/L 103  105  100   CO2 22 - 32 mmol/L 23  24  25    Calcium  8.9 - 10.3 mg/dL 9.3  8.0  8.5   Total Protein 6.5 - 8.1 g/dL 6.9     Total Bilirubin 0.0 - 1.2 mg/dL 0.3     Alkaline Phos 38 - 126 U/L 122     AST 15 - 41 U/L 33     ALT 0 - 44 U/L 18         RADIOGRAPHIC STUDIES: I have personally reviewed the radiological images as listed and agreed with the findings in the report. No results found.    Orders Placed This Encounter  Procedures   CBC with Differential (Cancer Center Only)    Standing Status:   Future    Expected Date:   02/09/2024    Expiration Date:   02/08/2025   CMP (Cancer Center only)    Standing Status:   Future    Expected Date:   02/09/2024    Expiration Date:   02/08/2025   All questions were answered. The patient knows to call the clinic with any problems, questions or concerns. No barriers to learning was detected. The total time spent in the appointment was 25 minutes, including review of chart and various tests results, discussions about plan of care and coordination of care plan     Onita Mattock, MD 01/15/2024

## 2024-01-15 ENCOUNTER — Inpatient Hospital Stay: Attending: Hematology | Admitting: Hematology

## 2024-01-15 ENCOUNTER — Telehealth: Payer: Self-pay

## 2024-01-15 ENCOUNTER — Inpatient Hospital Stay

## 2024-01-15 ENCOUNTER — Inpatient Hospital Stay: Attending: Hematology

## 2024-01-15 VITALS — BP 137/53 | HR 86 | Temp 97.3°F | Resp 17 | Wt 193.1 lb

## 2024-01-15 DIAGNOSIS — T451X5A Adverse effect of antineoplastic and immunosuppressive drugs, initial encounter: Secondary | ICD-10-CM | POA: Diagnosis not present

## 2024-01-15 DIAGNOSIS — Z8049 Family history of malignant neoplasm of other genital organs: Secondary | ICD-10-CM | POA: Insufficient documentation

## 2024-01-15 DIAGNOSIS — Z800 Family history of malignant neoplasm of digestive organs: Secondary | ICD-10-CM | POA: Diagnosis not present

## 2024-01-15 DIAGNOSIS — C7951 Secondary malignant neoplasm of bone: Secondary | ICD-10-CM | POA: Diagnosis not present

## 2024-01-15 DIAGNOSIS — E538 Deficiency of other specified B group vitamins: Secondary | ICD-10-CM

## 2024-01-15 DIAGNOSIS — Z86000 Personal history of in-situ neoplasm of breast: Secondary | ICD-10-CM | POA: Insufficient documentation

## 2024-01-15 DIAGNOSIS — C3432 Malignant neoplasm of lower lobe, left bronchus or lung: Secondary | ICD-10-CM

## 2024-01-15 DIAGNOSIS — Z923 Personal history of irradiation: Secondary | ICD-10-CM | POA: Diagnosis not present

## 2024-01-15 DIAGNOSIS — C3490 Malignant neoplasm of unspecified part of unspecified bronchus or lung: Secondary | ICD-10-CM

## 2024-01-15 DIAGNOSIS — Z8051 Family history of malignant neoplasm of kidney: Secondary | ICD-10-CM | POA: Diagnosis not present

## 2024-01-15 DIAGNOSIS — Z79899 Other long term (current) drug therapy: Secondary | ICD-10-CM | POA: Insufficient documentation

## 2024-01-15 DIAGNOSIS — L03116 Cellulitis of left lower limb: Secondary | ICD-10-CM | POA: Insufficient documentation

## 2024-01-15 DIAGNOSIS — R062 Wheezing: Secondary | ICD-10-CM | POA: Diagnosis not present

## 2024-01-15 DIAGNOSIS — C3412 Malignant neoplasm of upper lobe, left bronchus or lung: Secondary | ICD-10-CM | POA: Insufficient documentation

## 2024-01-15 DIAGNOSIS — Z803 Family history of malignant neoplasm of breast: Secondary | ICD-10-CM | POA: Insufficient documentation

## 2024-01-15 DIAGNOSIS — C186 Malignant neoplasm of descending colon: Secondary | ICD-10-CM

## 2024-01-15 DIAGNOSIS — D84821 Immunodeficiency due to drugs: Secondary | ICD-10-CM | POA: Diagnosis not present

## 2024-01-15 DIAGNOSIS — R0609 Other forms of dyspnea: Secondary | ICD-10-CM | POA: Insufficient documentation

## 2024-01-15 DIAGNOSIS — Z85038 Personal history of other malignant neoplasm of large intestine: Secondary | ICD-10-CM | POA: Insufficient documentation

## 2024-01-15 LAB — CBC WITH DIFFERENTIAL (CANCER CENTER ONLY)
Abs Immature Granulocytes: 0.1 K/uL — ABNORMAL HIGH (ref 0.00–0.07)
Basophils Absolute: 0.1 K/uL (ref 0.0–0.1)
Basophils Relative: 1 %
Eosinophils Absolute: 0.1 K/uL (ref 0.0–0.5)
Eosinophils Relative: 1 %
HCT: 34.3 % — ABNORMAL LOW (ref 36.0–46.0)
Hemoglobin: 11.4 g/dL — ABNORMAL LOW (ref 12.0–15.0)
Immature Granulocytes: 1 %
Lymphocytes Relative: 10 %
Lymphs Abs: 1 K/uL (ref 0.7–4.0)
MCH: 32.1 pg (ref 26.0–34.0)
MCHC: 33.2 g/dL (ref 30.0–36.0)
MCV: 96.6 fL (ref 80.0–100.0)
Monocytes Absolute: 1.1 K/uL — ABNORMAL HIGH (ref 0.1–1.0)
Monocytes Relative: 11 %
Neutro Abs: 7.8 K/uL — ABNORMAL HIGH (ref 1.7–7.7)
Neutrophils Relative %: 76 %
Platelet Count: 637 K/uL — ABNORMAL HIGH (ref 150–400)
RBC: 3.55 MIL/uL — ABNORMAL LOW (ref 3.87–5.11)
RDW: 16.6 % — ABNORMAL HIGH (ref 11.5–15.5)
WBC Count: 10.2 K/uL (ref 4.0–10.5)
nRBC: 0.6 % — ABNORMAL HIGH (ref 0.0–0.2)

## 2024-01-15 LAB — CMP (CANCER CENTER ONLY)
ALT: 18 U/L (ref 0–44)
AST: 33 U/L (ref 15–41)
Albumin: 3.3 g/dL — ABNORMAL LOW (ref 3.5–5.0)
Alkaline Phosphatase: 122 U/L (ref 38–126)
Anion gap: 12 (ref 5–15)
BUN: 24 mg/dL — ABNORMAL HIGH (ref 8–23)
CO2: 23 mmol/L (ref 22–32)
Calcium: 9.3 mg/dL (ref 8.9–10.3)
Chloride: 103 mmol/L (ref 98–111)
Creatinine: 1.65 mg/dL — ABNORMAL HIGH (ref 0.44–1.00)
GFR, Estimated: 30 mL/min — ABNORMAL LOW (ref >60)
Glucose, Bld: 123 mg/dL — ABNORMAL HIGH (ref 70–99)
Potassium: 5 mmol/L (ref 3.5–5.1)
Sodium: 137 mmol/L (ref 135–145)
Total Bilirubin: 0.3 mg/dL (ref 0.0–1.2)
Total Protein: 6.9 g/dL (ref 6.5–8.1)

## 2024-01-15 LAB — VITAMIN B12: Vitamin B-12: 1339 pg/mL — ABNORMAL HIGH (ref 180–914)

## 2024-01-15 LAB — TSH: TSH: 2.18 u[IU]/mL (ref 0.350–4.500)

## 2024-01-15 NOTE — Assessment & Plan Note (Addendum)
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  During her CT surveillance for her colon cancer in 2022, she was found to have multiple lung nodules.  The left upper lung nodule size slightly increased in 2023, she underwent bronchoscopy biopsy which was negative.  Due to the further increase in the size in 2025, she had a repeated bronchoscopy biopsy in August 2025, which confirmed adenocarcinoma.  The immunostain studies do not support colorectal primary, and a favor lung or upper GI primary.  EGD and colonoscopy were negative for malignancy. -PET scan in September 2025 unfortunately showed a oligo bone metastasis in right scapula.  She was referred to see thoracic surgeon Dr. Kerrin, surgery was not offered due to the metastatic disease. -Guardant360 was negative for targetable mutation -I recommend chemotherapy with single agent pemetrexed , and Keytruda , her  PD-L1 is positive

## 2024-01-15 NOTE — Telephone Encounter (Signed)
 Copied from CRM #[id number]. Topic: Clinical - Home Health Verbal Orders >> Jan 12, 2024  4:13 PM Drema MATSU wrote: Caller/Agency: [facility] Callback Number: [phone] Patient was scheduled for home care and when [facility] called today to set up, pt stated that she feel as though she didn't need home care. Pt went back to work today.

## 2024-01-15 NOTE — Transitions of Care (Post Inpatient/ED Visit) (Signed)
   01/15/2024  Name: Briana Ochoa MRN: [id number] DOB: Jan 01, 1941  Today's TOC FU Call Status: Today's TOC FU Call Status:: Unsuccessful Call (2nd Attempt) Unsuccessful Call (2nd Attempt) Date: 01/15/24  Attempted to reach the patient regarding the most recent Inpatient/ED visit.  Mailbox is full unable to leave a voicemail message at phone number provided on DPR and listed as preferred.  Follow Up Plan: Additional outreach attempts will be made to reach the patient to complete the Transitions of Care (Post Inpatient/ED visit) call.   Richerd Fish, RN, BSN, CCM [facility], [facility] Management Coordinator Direct Dial: [phone]

## 2024-01-16 ENCOUNTER — Telehealth: Payer: Self-pay

## 2024-01-16 LAB — T4: T4, Total: 8.1 ug/dL (ref 4.5–12.0)

## 2024-01-16 NOTE — Transitions of Care (Post Inpatient/ED Visit) (Signed)
   01/16/2024  Name: Briana Ochoa MRN: [id number] DOB: 08-24-1940  Today's TOC FU Call Status: Today's TOC FU Call Status:: Unsuccessful Call (3rd Attempt) Unsuccessful Call (3rd Attempt) Date: 01/16/24  Attempted to reach the patient regarding the most recent Inpatient/ED visit.  Unable to leave a voicemail as automated message states mailbox is full.  Follow Up Plan: No further outreach attempts will be made at this time. We have been unable to contact the patient.  Richerd Fish, RN, BSN, CCM [facility], [facility] Management Coordinator Direct Dial: [phone]

## 2024-01-17 ENCOUNTER — Ambulatory Visit: Admitting: Nurse Practitioner

## 2024-01-17 ENCOUNTER — Encounter: Payer: Self-pay | Admitting: Nurse Practitioner

## 2024-01-17 VITALS — BP 128/76 | HR 80 | Temp 97.7°F | Ht 64.0 in | Wt 191.0 lb

## 2024-01-17 DIAGNOSIS — I872 Venous insufficiency (chronic) (peripheral): Secondary | ICD-10-CM

## 2024-01-17 DIAGNOSIS — R600 Localized edema: Secondary | ICD-10-CM | POA: Diagnosis not present

## 2024-01-17 DIAGNOSIS — R011 Cardiac murmur, unspecified: Secondary | ICD-10-CM | POA: Diagnosis not present

## 2024-01-17 DIAGNOSIS — R0982 Postnasal drip: Secondary | ICD-10-CM

## 2024-01-17 MED ORDER — FUROSEMIDE 20 MG PO TABS: 10.0000 mg | ORAL_TABLET | Freq: Every day | ORAL | 0 refills | Status: AC

## 2024-01-17 MED ORDER — LORATADINE 10 MG PO TABS: 10.0000 mg | ORAL_TABLET | Freq: Every day | ORAL | 0 refills | Status: AC

## 2024-01-17 NOTE — Progress Notes (Signed)
 Established Patient Visit  Patient: Briana Ochoa   DOB: 19-Oct-1940   83 y.o. Female  MRN: [id number] Visit Date: 01/17/2024  Subjective:    Chief Complaint  Patient presents with   Follow-up    3 month follow up for HTN, DM and Hyperlipidemia  Left leg redness and knee pain    HPI Accompanied by daughter  Chronic venous insufficiency Venous ulcer healed, but persistent bilateral LE edema (L>R) and erythema, no increased warmth, no drainage. Recent hospitalization due to cellulitis in L. LE, repeat venous duplex was negative for DVT, treated with IV abx and discharged home with oral zyvox . She completes this course tomorrow. She denies any fever. She has LE pain due to swelling. Last echocardiogram completed 2016 Reports post nasal drip with cough, worse in AM. Denies any GERD symptoms or SOB or CP  Sent furosemide 10mg  x 4days Advised to use compression stocking daily CMP will be repeat by cancer center next week Use claritin 10mg  at bedtime x 7days, then stop Repeat echocardiogram  Reviewed medical, surgical, and social history today  Medications: Outpatient Medications Prior to Visit  Medication Sig   acetaminophen  (TYLENOL ) 500 MG tablet Take 1,000 mg by mouth daily.   aspirin  EC 81 MG tablet Take 81 mg by mouth daily. Swallow whole.   cephALEXin  (KEFLEX ) 500 MG capsule Take 1 capsule (500 mg total) by mouth 2 (two) times daily for 7 days.   folic acid  (FOLVITE ) 1 MG tablet Take 1 tablet (1 mg total) by mouth daily. Start 7 days before pemetrexed  chemotherapy. Continue until 21 days after pemetrexed  completed.   glucose blood (TRUE METRIX BLOOD GLUCOSE TEST) test strip Use as instructed to test blood sugar once daily E08.638   guaiFENesin -dextromethorphan  (ROBITUSSIN DM) 100-10 MG/5ML syrup Take 5 mLs by mouth every 6 (six) hours as needed for cough.   ipratropium-albuterol  (DUONEB) 0.5-2.5 (3) MG/3ML SOLN Take 3 mLs by nebulization every 6 (six) hours  as needed.   linezolid  (ZYVOX ) 600 MG tablet Take 1 tablet (600 mg total) by mouth every 12 (twelve) hours for 7 days. Take first dose starting this evening, 01/11/24.   lisinopril  (ZESTRIL ) 20 MG tablet Take 1 tablet (20 mg total) by mouth daily.   loperamide  (IMODIUM ) 2 MG capsule Take 2 capsules (4 mg total) by mouth as needed for up to 8 days for diarrhea or loose stools.   mupirocin  ointment (BACTROBAN ) 2 % Apply 1 Application topically 2 (two) times daily. Apply with dressing change   ondansetron  (ZOFRAN ) 8 MG tablet Take 1 tablet (8 mg total) by mouth every 8 (eight) hours as needed for nausea or vomiting. Start on the third day after carboplatin.   ondansetron  (ZOFRAN -ODT) 4 MG disintegrating tablet Take 1 tablet (4 mg total) by mouth every 8 (eight) hours as needed for up to 8 days for nausea or vomiting.   pravastatin  (PRAVACHOL ) 20 MG tablet Take 1 tablet (20 mg total) by mouth every evening.   sodium chloride  (OCEAN) 0.65 % SOLN nasal spray Place 1 spray into both nostrils as needed for congestion.   No facility-administered medications prior to visit.   Reviewed past medical and social history.   ROS per HPI above  Last CBC Lab Results  Component Value Date   WBC 10.2 01/15/2024   HGB 11.4 (L) 01/15/2024   HCT 34.3 (L) 01/15/2024   MCV 96.6 01/15/2024   MCH 32.1 01/15/2024  RDW 16.6 (H) 01/15/2024   PLT 637 (H) 01/15/2024   Last metabolic panel Lab Results  Component Value Date   GLUCOSE 123 (H) 01/15/2024   NA 137 01/15/2024   K 5.0 01/15/2024   CL 103 01/15/2024   CO2 23 01/15/2024   BUN 24 (H) 01/15/2024   CREATININE 1.65 (H) 01/15/2024   GFRNONAA 30 (L) 01/15/2024   CALCIUM  9.3 01/15/2024   PROT 6.9 01/15/2024   ALBUMIN  3.3 (L) 01/15/2024   BILITOT 0.3 01/15/2024   ALKPHOS 122 01/15/2024   AST 33 01/15/2024   ALT 18 01/15/2024   ANIONGAP 12 01/15/2024   Last thyroid  functions Lab Results  Component Value Date   TSH 2.180 01/15/2024   T4TOTAL 8.1  01/15/2024   FREET4 0.96 04/16/2013        Objective:  BP 128/76 (BP Location: Right Arm, Patient Position: Sitting, Cuff Size: Large)   Pulse 80   Temp 97.7 F (36.5 C) (Oral)   Ht 5' 4 (1.626 m)   Wt 191 lb (86.6 kg)   SpO2 97%   BMI 32.79 kg/m      Physical Exam Vitals and nursing note reviewed.  Cardiovascular:     Rate and Rhythm: Normal rate and regular rhythm.     Pulses: Normal pulses.     Heart sounds: Normal heart sounds.  Pulmonary:     Effort: Pulmonary effort is normal.     Breath sounds: Normal breath sounds.  Musculoskeletal:     Right lower leg: Edema present.     Left lower leg: Edema present.  Skin:    Findings: Erythema present.  Neurological:     Mental Status: She is alert and oriented to person, place, and time.     No results found for any visits on 01/17/24.    Assessment & Plan:    Problem List Items Addressed This Visit     Chronic venous insufficiency - Primary   Venous ulcer healed, but persistent bilateral LE edema (L>R) and erythema, no increased warmth, no drainage. Recent hospitalization due to cellulitis in L. LE, repeat venous duplex was negative for DVT, treated with IV abx and discharged home with oral zyvox . She completes this course tomorrow. She denies any fever. She has LE pain due to swelling. Last echocardiogram completed 2016 Reports post nasal drip with cough, worse in AM. Denies any GERD symptoms or SOB or CP  Sent furosemide 10mg  x 4days Advised to use compression stocking daily CMP will be repeat by cancer center next week Use claritin 10mg  at bedtime x 7days, then stop Repeat echocardiogram      Relevant Medications   furosemide (LASIX) 20 MG tablet   Other Visit Diagnoses       Postnasal drip       Relevant Medications   loratadine (CLARITIN) 10 MG tablet     Localized edema       Relevant Medications   furosemide (LASIX) 20 MG tablet   Other Relevant Orders   ECHOCARDIOGRAM COMPLETE     Murmur,  heart       Relevant Medications   furosemide (LASIX) 20 MG tablet   Other Relevant Orders   ECHOCARDIOGRAM COMPLETE      Return in about 6 weeks (around 02/28/2024) for LE edema and redness.     Roselie Mood, NP

## 2024-01-17 NOTE — Patient Instructions (Addendum)
 Schedule appointment with ortho at [facility]: Leonce Panning, PA [phone] Use compression stocking during the day and off at night. Elevate legs when sitting. You will be contacted to schedule appointment for echocardiogram Use claritin 10mg  at bedtime x 7days, then stop

## 2024-01-17 NOTE — Assessment & Plan Note (Addendum)
 Venous ulcer healed, but persistent bilateral LE edema (L>R) and erythema, no increased warmth, no drainage. Recent hospitalization due to cellulitis in L. LE, repeat venous duplex was negative for DVT, treated with IV abx and discharged home with oral zyvox . She completes this course tomorrow. She denies any fever. She has LE pain due to swelling. Last echocardiogram completed 2016 Reports post nasal drip with cough, worse in AM. Denies any GERD symptoms or SOB or CP  Sent furosemide 10mg  x 4days Advised to use compression stocking daily CMP will be repeat by cancer center next week Use claritin 10mg  at bedtime x 7days, then stop Repeat echocardiogram

## 2024-01-25 ENCOUNTER — Encounter (HOSPITAL_BASED_OUTPATIENT_CLINIC_OR_DEPARTMENT_OTHER): Admitting: General Surgery

## 2024-02-07 ENCOUNTER — Telehealth: Payer: Self-pay

## 2024-02-07 ENCOUNTER — Other Ambulatory Visit: Payer: Self-pay | Admitting: Nurse Practitioner

## 2024-02-07 MED ORDER — AZITHROMYCIN 250 MG PO TABS: ORAL_TABLET | ORAL | 0 refills | Status: AC

## 2024-02-07 NOTE — Telephone Encounter (Signed)
 Spoke with pt via telephone regarding pt's cold/flu like symptoms.  Pt stated symptoms started on Sunday 02/04/2024.  Pt stated she's continuously productive coughing controllable.  Pt stated she's having yellow to clear phlegm Pt denied fever and throat redness or irritation.  Pt stated she's taking NyQuil & Robitussin for her symptoms.  Instructed pt to take a home Flu and Covid test to r/o Flu & Covid.  Pt verbalized understanding and stated she would take one today when she goes out.  Pt asked should she come in as scheduled on 02/09/2024 since she's sick.  Consulted with Powell Lessen, NP regarding pt's appts on 02/09/2024.  Stated that Powell will have the pt's appts for 02/09/2024 rescheduled for 02/09/2024 and will call in an Abx for the pt to start taking for her symptoms.  Pt verbalized understanding and had no further questions or concerns at this time.

## 2024-02-09 ENCOUNTER — Inpatient Hospital Stay: Admitting: Nurse Practitioner

## 2024-02-09 ENCOUNTER — Inpatient Hospital Stay

## 2024-02-14 ENCOUNTER — Other Ambulatory Visit: Payer: Self-pay

## 2024-02-14 ENCOUNTER — Telehealth: Payer: Self-pay

## 2024-02-14 NOTE — Telephone Encounter (Signed)
 This medical assistant was notified via secure chat about scheduling this patient in clinic. (See 02/07/24 telephone note)  Spoke with patient via telephone call. Patient stated she had not taken at home COVID/FLU test, or followed up w/ PCP. Patient stated she was still taking ABX x Heather Boscia, DNP. Spoke with Powell Lessen, DNP. Let patient know based on COVID/FLU protocol, we need a negative COVID/FLU test or we can schedule her 21 days after s/s onset. Patient voiced understanding agreed to plan and stated she will f/u with us  via telephone call.

## 2024-02-16 ENCOUNTER — Ambulatory Visit: Admitting: Medical

## 2024-02-16 ENCOUNTER — Ambulatory Visit: Payer: Self-pay

## 2024-02-16 ENCOUNTER — Encounter: Payer: Self-pay | Admitting: Medical

## 2024-02-16 VITALS — BP 118/70 | HR 94 | Temp 98.0°F | Resp 15 | Ht 64.0 in | Wt 181.8 lb

## 2024-02-16 DIAGNOSIS — R5383 Other fatigue: Secondary | ICD-10-CM | POA: Diagnosis not present

## 2024-02-16 DIAGNOSIS — J40 Bronchitis, not specified as acute or chronic: Secondary | ICD-10-CM

## 2024-02-16 DIAGNOSIS — J029 Acute pharyngitis, unspecified: Secondary | ICD-10-CM | POA: Diagnosis not present

## 2024-02-16 DIAGNOSIS — R051 Acute cough: Secondary | ICD-10-CM

## 2024-02-16 LAB — COMPREHENSIVE METABOLIC PANEL WITH GFR
AG Ratio: 1.2 (calc) (ref 1.0–2.5)
ALT: 9 U/L (ref 6–29)
AST: 18 U/L (ref 10–35)
Albumin: 3.6 g/dL (ref 3.6–5.1)
Alkaline phosphatase (APISO): 104 U/L (ref 37–153)
BUN/Creatinine Ratio: 17 (calc) (ref 6–22)
BUN: 23 mg/dL (ref 7–25)
CO2: 25 mmol/L (ref 20–32)
Calcium: 9.2 mg/dL (ref 8.6–10.4)
Chloride: 103 mmol/L (ref 98–110)
Creat: 1.37 mg/dL — ABNORMAL HIGH (ref 0.60–0.95)
Globulin: 2.9 g/dL (ref 1.9–3.7)
Glucose, Bld: 117 mg/dL — ABNORMAL HIGH (ref 65–99)
Potassium: 4.8 mmol/L (ref 3.5–5.3)
Sodium: 137 mmol/L (ref 135–146)
Total Bilirubin: 0.5 mg/dL (ref 0.2–1.2)
Total Protein: 6.5 g/dL (ref 6.1–8.1)
eGFR: 38 mL/min/1.73m2 — ABNORMAL LOW

## 2024-02-16 LAB — CBC WITH DIFFERENTIAL/PLATELET
Absolute Lymphocytes: 1476 {cells}/uL (ref 850–3900)
Absolute Monocytes: 1623 {cells}/uL — ABNORMAL HIGH (ref 200–950)
Basophils Absolute: 80 {cells}/uL (ref 0–200)
Basophils Relative: 0.6 %
Eosinophils Absolute: 67 {cells}/uL (ref 15–500)
Eosinophils Relative: 0.5 %
HCT: 34.9 % — ABNORMAL LOW (ref 35.9–46.0)
Hemoglobin: 11.7 g/dL (ref 11.7–15.5)
MCH: 32.4 pg (ref 27.0–33.0)
MCHC: 33.5 g/dL (ref 31.6–35.4)
MCV: 96.7 fL (ref 81.4–101.7)
MPV: 11.3 fL (ref 7.5–12.5)
Monocytes Relative: 12.2 %
Neutro Abs: 10055 {cells}/uL — ABNORMAL HIGH (ref 1500–7800)
Neutrophils Relative %: 75.6 %
Platelets: 348 Thousand/uL (ref 140–400)
RBC: 3.61 Million/uL — ABNORMAL LOW (ref 3.80–5.10)
RDW: 17.7 % — ABNORMAL HIGH (ref 11.0–15.0)
Total Lymphocyte: 11.1 %
WBC: 13.3 Thousand/uL — ABNORMAL HIGH (ref 3.8–10.8)

## 2024-02-16 LAB — POCT INFLUENZA A/B
Influenza A, POC: NEGATIVE
Influenza B, POC: NEGATIVE

## 2024-02-16 LAB — POCT RAPID STREP A (OFFICE): Rapid Strep A Screen: NEGATIVE

## 2024-02-16 MED ORDER — DOXYCYCLINE HYCLATE 100 MG PO TABS: 100.0000 mg | ORAL_TABLET | Freq: Two times a day (BID) | ORAL | 0 refills | Status: AC

## 2024-02-16 MED ORDER — CEFTRIAXONE SODIUM 1 G IJ SOLR
1.0000 g | Freq: Once | INTRAMUSCULAR | Status: AC
  Administered 2024-02-16: 1 g via INTRAMUSCULAR

## 2024-02-16 MED ORDER — BENZONATATE 100 MG PO CAPS: 100.0000 mg | ORAL_CAPSULE | Freq: Three times a day (TID) | ORAL | 0 refills | Status: AC | PRN

## 2024-02-16 NOTE — Telephone Encounter (Signed)
 FYI Only or Action Required?: FYI only for provider: appointment scheduled on 1/9.  Patient was last seen in primary care on 01/17/2024 by Nche, Roselie Rockford, NP.  Called Nurse Triage reporting Shortness of Breath.  Symptoms began several weeks ago.  Interventions attempted: OTC medications: mucinex , robitussin DM, Prescription medications: azithromycin  7-day course from cancer center, duoneb nebulizer, and Rest, hydration, or home remedies.  Symptoms are: gradually worsening.  Triage Disposition: See HCP Within 4 Hours (Or PCP Triage)  Patient/caregiver understands and will follow disposition?: Yes         Copied from CRM [id number]. Topic: Clinical - Red Word Triage >> Feb 16, 2024 10:26 AM Chasity T wrote: [facility] that prompted transfer to Nurse Triage: patient has been having a bad cough for 12 days, no chest pain but is having SOB Reason for Disposition  [1] Longstanding difficulty breathing (e.g., CHF, COPD, emphysema) AND [2] WORSE than normal  Answer Assessment - Initial Assessment Questions This RN recommended pt be examined in next 4 hours, scheduled for today at other office in region. Advised call back or seek immediate care if new or worsening symptoms.    Speaking to pt daughter Shona  Worsening/persisting symptoms despite antibx: Worse SOB than usual Cough productive to clear sputum Weaker than normal Lower back pain - recent heavy lifting  Denies: Too weak to stand Heart racing, cold/clammy skin Chest pain Abnormal breath sounds Hx blood clots in legs or lungs Use of albuterol  rescue inhaler  Pt confirms she breathes better after each duoneb treatment  Protocols used: Breathing Difficulty-A-AH

## 2024-02-16 NOTE — Progress Notes (Signed)
 "  Subjective:    Patient ID: Briana Ochoa, female    DOB: [birth year], 84 y.o.   MRN: [id number]  HPI  Briana Ochoa is an 84 year old female with lung cancer who presents with persistent cough and shortness of breath. She is accompanied by her daughter.  She has had worsening shortness of breath and severe cough for ten days. The cough is usually nonproductive but can bring up small amounts of mucus with forceful coughing. It started after a recent hospitalization for a leg infection related to chemotherapy. Symptoms initially improved, then recurred about ten days ago. She denies fever, chills, or sweats. The cough causes rib and diaphragmatic soreness and new lower back pain. NyQuil helps her sleep but does not reduce the cough.  She has lung cancer and was recently hospitalized for a severe leg infection. During that stay she received DuoNeb (ipratropium/albuterol ) for severe coughing. She denies asthma. She is a former smoker who quit over twenty years ago. She completed azithromycin  (Z-Pak) for five days starting 02/07/2024 with no significant improvement. She previously received doxycycline  for the leg infection.  She reports possible chronic bronchitis. She has been using a nebulizer but dislikes it because it causes shaking. Her daughter reports low oxygen levels since the hospitalization.  She has renal impairment with GFR 30 and creatinine 1.65. She has no antibiotic allergies but is allergic to [facility]. Her daughter is concerned about potential kidney effects of medications.   Review of Systems  Constitutional:  Positive for fatigue. Negative for chills and fever.  HENT:  Negative for congestion.   Respiratory:  Positive for cough and wheezing. Negative for chest tightness and shortness of breath.        Some recently.  Cardiovascular:  Negative for chest pain and palpitations.  Gastrointestinal:  Negative for abdominal pain.  Genitourinary:  Negative for dysuria.   Musculoskeletal:  Negative for back pain.  Neurological:  Negative for dizziness, syncope, weakness and light-headedness.  Hematological:  Negative for adenopathy.  Psychiatric/Behavioral:  Negative for behavioral problems, decreased concentration and dysphoric mood.      Past Medical History:  Diagnosis Date   Allergy    seasonal allergies   Anemia    on meds   Aortic atherosclerosis    Bladder cancer (HCC) 2020   Bladder tumor    Blood transfusion without reported diagnosis    Bloody diarrhea 09/29/2017   Breast cancer (HCC) 2015   Left Breast Cancer   Cataract    bilateral -sx   CKD (chronic kidney disease), stage III (HCC)    DM related   Colon cancer (HCC)    Diabetes mellitus type 2, diet-controlled (HCC)    diet controlled   Dyspnea 09/28/2017   Dysuria    Family history of adverse reaction to anesthesia    aunt- N/V    Family history of breast cancer    Family history of kidney cancer    Family history of throat cancer    Family history of uterine cancer    Fatigue 09/28/2017   Frequency of urination    Grade I diastolic dysfunction 08/06/2014   Noted on ECHO   Gross hematuria 09/29/2017   Heart murmur    since rheumatic fever as child   Hematuria    History of cardiomegaly 02/12/2005   Mild, noted on CXR   History of CVA (cerebrovascular accident) 08/05/2014   post op cerebral angiogram, cerebral thrombosis w/ cerebral infarction;  11-02-2017  per pt no  residuals   History of malignant melanoma of skin 12/2015   s/p wide local excision right lower leg (per pt localized)   History of rheumatic fever as a child    Hyperlipidemia    on meds   Hypertension    on meds   Intracranial aneurysm dx 07/ 2015   paraophthalmic RICA aneurysm /   s/p  Pipeline embolization right ICA 01-09-2014   Jaundice 09/28/2017   Malignant neoplasm of central portion of left breast in female, estrogen receptor positive [facility]) oncologist-  dr odean   dx 07/ 2015--- DCIS,  ER/PR positive-- s/p  left breast lumpectomy 09-19-2013,  completed radiation 11-12-2013,  started arimidex  11-12-2013   Melanoma (HCC)    righ tleg    OA (osteoarthritis)    knees, hands, R shoulder   Osteopenia 10/16/2012   Diffuse   Personal history of radiation therapy    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   Sigmoid diverticulosis    Stroke (HCC)    Weakness 09/28/2017   Wears dentures    upper   Wears glasses    Wears hearing aid in both ears      Social History   Socioeconomic History   Marital status: Widowed    Spouse name: Not on file   Number of children: 4   Years of education: Not on file   Highest education level: Not on file  Occupational History    Comment: Retired  Tobacco Use   Smoking status: Former    Current packs/day: 0.00    Average packs/day: 1.5 packs/day for 40.0 years (60.0 ttl pk-yrs)    Types: Cigarettes    Start date: 03/06/1967    Quit date: 03/06/2007    Years since quitting: 16.9    Passive exposure: Past   Smokeless tobacco: Never  Vaping Use   Vaping status: Never Used  Substance and Sexual Activity   Alcohol use: No   Drug use: Never   Sexual activity: Not on file  Other Topics Concern   Not on file  Social History Narrative   Widowed 2013 originally from [place]  moved to [place] decades ago to work for [employer]   5 children   17 grandchildren      Does not have a living will-desires CPR, does not want prolonged life support if futile.   Social Drivers of Health   Tobacco Use: Medium Risk (02/16/2024)   Patient History    Smoking Tobacco Use: Former    Smokeless Tobacco Use: Never    Passive Exposure: Past  Physicist, Medical Strain: Not on file  Food Insecurity: No Food Insecurity (01/06/2024)   Epic    Worried About Programme Researcher, Broadcasting/film/video in the Last Year: Never true    Ran Out of Food in the Last Year: Never true  Transportation Needs: No Transportation Needs (01/06/2024)   Epic    Lack of  Transportation (Medical): No    Lack of Transportation (Non-Medical): No  Physical Activity: Not on file  Stress: Not on file  Social Connections: Moderately Integrated (01/05/2024)   Social Connection and Isolation Panel    Frequency of Communication with Friends and Family: More than three times a week    Frequency of Social Gatherings with Friends and Family: Twice a week    Attends Religious Services: 1 to 4 times per year    Active Member of [employer] or Organizations: No    Attends Banker Meetings: 1  to 4 times per year    Marital Status: Widowed  Intimate Partner Violence: Not At Risk (01/06/2024)   Epic    Fear of Current or Ex-Partner: No    Emotionally Abused: No    Physically Abused: No    Sexually Abused: No  Depression (PHQ2-9): Low Risk (01/15/2024)   Depression (PHQ2-9)    PHQ-2 Score: 0  Alcohol Screen: Not on file  Housing: Low Risk (01/06/2024)   Epic    Unable to Pay for Housing in the Last Year: No    Number of Times Moved in the Last Year: 0    Homeless in the Last Year: No  Utilities: Not At Risk (01/06/2024)   Epic    Threatened with loss of utilities: No  Health Literacy: Not on file    Past Surgical History:  Procedure Laterality Date   BREAST LUMPECTOMY Left 2015   BREAST LUMPECTOMY WITH NEEDLE LOCALIZATION Left 09/19/2013   Procedure: LEFT BREAST LUMPECTOMY WITH DOUBLE WIRE BRACKETED  NEEDLE LOCALIZATION;  Surgeon: Elon CHRISTELLA Pacini, MD;  Location: [facility] OR;  Service: General;  Laterality: Left;   BRONCHIAL BIOPSY  04/06/2021   Procedure: BRONCHIAL BIOPSIES;  Surgeon: Brenna Adine CROME, DO;  Location: [facility] ENDOSCOPY;  Service: Pulmonary;;   BRONCHIAL BRUSHINGS  04/06/2021   Procedure: BRONCHIAL BRUSHINGS;  Surgeon: Brenna Adine CROME, DO;  Location: [facility] ENDOSCOPY;  Service: Pulmonary;;   CATARACT EXTRACTION W/ INTRAOCULAR LENS  IMPLANT, BILATERAL  06/2015   CESAREAN SECTION  1964   CHOLECYSTECTOMY OPEN  1970s   CYSTOSCOPY W/ URETERAL STENT PLACEMENT  Left 11/07/2017   Procedure: CYSTOSCOPY WITH RETROGRADE PYELOGRAM/URETERAL STENT PLACEMENT;  Surgeon: Sherrilee Belvie CROME, MD;  Location: [facility];  Service: Urology;  Laterality: Left;   CYSTOSCOPY W/ URETERAL STENT PLACEMENT Bilateral 04/27/2018   Procedure: CYSTOSCOPY WITH RETROGRADE PYELOGRAM;  Surgeon: Sherrilee Belvie CROME, MD;  Location: [facility];  Service: Urology;  Laterality: Bilateral;   FINGER SURGERY     lt thumb  from dog bite   IR  NEPHROURETERAL CATH PLACE LEFT  04/28/2018   MELANOMA EXCISION Right 12/28/2015   Procedure: EXCISION MELANOMA RIGHT LOWER LEG;  Surgeon: Elon Pacini, MD;  Location: [facility];  Service: General;  Laterality: Right;   RADIOLOGY WITH ANESTHESIA N/A 01/09/2014   Procedure: Embolization;  Surgeon: Gerldine Maizes, MD;  Location: [facility] OR;  Service: Radiology;  Laterality: N/A;   SPLENECTOMY, TOTAL  2009 approx.   splen injury due to fall off horse   TONSILLECTOMY  child   TRANSURETHRAL RESECTION OF BLADDER TUMOR N/A 11/07/2017   Procedure: TRANSURETHRAL RESECTION OF BLADDER TUMOR (TURBT), POSSIBLE STENT PLACEMENT;  Surgeon: Sherrilee Belvie CROME, MD;  [facility];  Service: Urology;  Laterality: N/A;   TRANSURETHRAL RESECTION OF BLADDER TUMOR N/A 12/14/2017   Procedure: TRANSURETHRAL RESECTION OF BLADDER TUMOR (TURBT);  Surgeon: Sherrilee Belvie CROME, MD;  Location: [facility];  Service: Urology;  Laterality: N/A;  30 MINS   TRANSURETHRAL RESECTION OF BLADDER TUMOR N/A 04/27/2018   Procedure: TRANSURETHRAL RESECTION OF BLADDER TUMOR (TURBT);  Surgeon: Sherrilee Belvie CROME, MD;  Location: [facility];  Service: Urology;  Laterality: N/A;  30 MINS   VIDEO BRONCHOSCOPY WITH ENDOBRONCHIAL NAVIGATION Bilateral 10/02/2023   Procedure: VIDEO BRONCHOSCOPY WITH ENDOBRONCHIAL NAVIGATION;  Surgeon: Shelah Lamar RAMAN, MD;  Location: [facility] ENDOSCOPY;  Service: Pulmonary;   Laterality: Bilateral;   VIDEO BRONCHOSCOPY WITH RADIAL ENDOBRONCHIAL ULTRASOUND  04/06/2021   Procedure:  RADIAL ENDOBRONCHIAL ULTRASOUND;  Surgeon: Brenna Adine CROME, DO;  Location: [facility];  Service: Pulmonary;;    Family History  Problem Relation Age of Onset   Hypertension Mother    Kidney cancer Mother        dx early 58s   Heart attack Paternal Grandfather    Throat cancer Brother 72       hx smoking   Head & neck cancer Son 49       jaw cancer   Breast cancer Maternal Aunt 56   Breast cancer Paternal Aunt 64   Uterine cancer Cousin        dx early 46s (maternal first cousin)   Colon polyps Neg Hx    Colon cancer Neg Hx    Esophageal cancer Neg Hx    Rectal cancer Neg Hx    Stomach cancer Neg Hx     Allergies[1]  Medications Ordered Prior to Encounter[2]  BP 118/70   Pulse 94   Temp 98 F (36.7 C) (Oral)   Resp 15   Ht 5' 4 (1.626 m)   Wt 181 lb 12.8 oz (82.5 kg)   SpO2 95%   BMI 31.21 kg/m        Objective:   Physical Exam  General Mental Status- Alert. General Appearance- Not in acute distress.   Skin General: Color- Normal Color. Moisture- Normal Moisture.  Neck  No JVD.  Chest and Lung Exam Auscultation: Breath Sounds:-CTA  Cardiovascular Auscultation:Rythm- RRR Murmurs & Other Heart Sounds:Auscultation of the heart reveals- No Murmurs.  Abdomen Inspection:-Inspeection Normal. Palpation/Percussion:Note:No mass. Palpation and Percussion of the abdomen reveal- Non Tender, Non Distended + BS, no rebound or guarding.   Neurologic Cranial Nerve exam:- CN III-XII intact(No nystagmus), symmetric smile. Strength:- 5/5 equal and symmetric strength both upper and lower extremities.    Lower ext- calfs symmetric, no pedal edema. Negative homans signs.     Assessment & Plan:   Acute bronchitis Persistent productive cough with fatigue. Oxygen saturation 93-95%. Azithromycin  ineffective. Doxycycline   rx. - Ordered chest x-ray. - Ordered  CBC and cmp stat - Administered Rocephin  injection. - Prescribed doxycycline . - Prescribed benzonatate  for cough. - Duoneb every 6 hour as needed wheezing --after hours saw that pt did not get xray. They did not want to register in ED since receptionist not at desk. So daughter states she will take mom to [employer] where she works. They can do xray and they will send me radiology overread.   Fatigue Likely secondary to bronchitis and possible pneumonia   Follow up next wed with pcp or sooner if needed    [1]  Allergies Allergen Reactions   Feraheme  [Ferumoxytol ] Shortness Of Breath and Palpitations    Flushed face; palpitations, SOB  [2]  Current Outpatient Medications on File Prior to Visit  Medication Sig Dispense Refill   acetaminophen  (TYLENOL ) 500 MG tablet Take 1,000 mg by mouth daily.     aspirin  EC 81 MG tablet Take 81 mg by mouth daily. Swallow whole.     azithromycin  (ZITHROMAX  Z-PAK) 250 MG tablet Take by mouth as directed per package instructions. 6 each 0   folic acid  (FOLVITE ) 1 MG tablet Take 1 tablet (1 mg total) by mouth daily. Start 7 days before pemetrexed  chemotherapy. Continue until 21 days after pemetrexed  completed. 100 tablet 3   furosemide  (LASIX ) 20 MG tablet Take 0.5 tablets (10 mg total) by mouth daily. 2 tablet 0   glucose blood (TRUE METRIX BLOOD GLUCOSE TEST) test  strip Use as instructed to test blood sugar once daily E08.638 100 each 2   guaiFENesin -dextromethorphan  (ROBITUSSIN DM) 100-10 MG/5ML syrup Take 5 mLs by mouth every 6 (six) hours as needed for cough. 118 mL 0   ipratropium-albuterol  (DUONEB) 0.5-2.5 (3) MG/3ML SOLN Take 3 mLs by nebulization every 6 (six) hours as needed. 360 mL 1   lisinopril  (ZESTRIL ) 20 MG tablet Take 1 tablet (20 mg total) by mouth daily. 90 tablet 3   loratadine  (CLARITIN ) 10 MG tablet Take 1 tablet (10 mg total) by mouth daily for 7 days. 7 tablet 0   mupirocin  ointment (BACTROBAN ) 2 % Apply 1 Application topically 2 (two)  times daily. Apply with dressing change 15 g 0   ondansetron  (ZOFRAN ) 8 MG tablet Take 1 tablet (8 mg total) by mouth every 8 (eight) hours as needed for nausea or vomiting. Start on the third day after carboplatin. 30 tablet 1   pravastatin  (PRAVACHOL ) 20 MG tablet Take 1 tablet (20 mg total) by mouth every evening. 90 tablet 1   sodium chloride  (OCEAN) 0.65 % SOLN nasal spray Place 1 spray into both nostrils as needed for congestion. 50 mL 0   No current facility-administered medications on file prior to visit.   "

## 2024-02-16 NOTE — Patient Instructions (Addendum)
 Acute bronchitis Persistent productive cough with fatigue. Oxygen saturation 93-95%. Azithromycin  ineffective. Doxycycline   rx. - Ordered chest x-ray. - Ordered CBC and cmp stat - Administered Rocephin  injection. - Prescribed doxycycline . - Prescribed benzonatate  for cough. - Duoneb every 6 hour as needed wheezing -after hours saw that pt did not get xray. They did not want to register in ED since receptionist not at desk. So daughter states she will take mom to [employer] where she works. They can do xray and they will send me radiology overread.  Fatigue Likely secondary to bronchitis and possible pneumonia   Follow up next wed with pcp or sooner if needed

## 2024-02-16 NOTE — Telephone Encounter (Signed)
 Seen with Dr. Purcell.

## 2024-02-17 ENCOUNTER — Ambulatory Visit: Payer: Self-pay | Admitting: Medical

## 2024-02-19 ENCOUNTER — Ambulatory Visit

## 2024-02-19 VITALS — Ht 64.0 in | Wt 181.0 lb

## 2024-02-19 DIAGNOSIS — Z0000 Encounter for general adult medical examination without abnormal findings: Secondary | ICD-10-CM | POA: Diagnosis not present

## 2024-02-19 NOTE — Progress Notes (Signed)
 "  Chief Complaint  Patient presents with   Medicare Wellness     Subjective:   Briana Ochoa is a 84 y.o. female who presents for a Medicare Annual Wellness Visit.  Visit info / Clinical Intake: Medicare Wellness Visit Type:: Subsequent Annual Wellness Visit Persons participating in visit and providing information:: patient & caregiver Medicare Wellness Visit Mode:: Video Since this visit was completed virtually, some vitals may be partially provided or unavailable. Missing vitals are due to the limitations of the virtual format.: Documented vitals are patient reported If Telephone or Video please confirm:: I connected with patient using audio/video enable telemedicine. I verified patient identity with two identifiers, discussed telehealth limitations, and patient agreed to proceed. Patient Location:: home Provider Location:: office Interpreter Needed?: No Pre-visit prep was completed: yes AWV questionnaire completed by patient prior to visit?: no Living arrangements:: with family/others Patient's Overall Health Status Rating: good (has lung cancer) Typical amount of pain: some Does pain affect daily life?: (!) yes Are you currently prescribed opioids?: no  Dietary Habits and Nutritional Risks How many meals a day?: 3 Eats fruit and vegetables daily?: yes (more vegetables) Most meals are obtained by: preparing own meals; eating out In the last 2 weeks, have you had any of the following?: (!) nausea, vomiting, diarrhea (due to medication) Diabetic:: (!) yes Any non-healing wounds?: no How often do you check your BS?: as needed Would you like to be referred to a Nutritionist or for Diabetic Management? : no  Functional Status Activities of Daily Living (to include ambulation/medication): Independent Ambulation: Independent Medication Administration: Independent Home Management (perform basic housework or laundry): Independent Manage your own finances?: yes Primary  transportation is: driving Concerns about vision?: no *vision screening is required for WTM* Concerns about hearing?: (!) yes Uses hearing aids?: (!) yes (does not work well)  Fall Screening Falls in the past year?: 1 (fell out of bed) Number of falls in past year: 0 Was there an injury with Fall?: 1 Fall Risk Category Calculator: 2 Patient Fall Risk Level: Moderate Fall Risk  Fall Risk Patient at Risk for Falls Due to: Medication side effect Fall risk Follow up: Falls evaluation completed; Falls prevention discussed  Home and Transportation Safety: All rugs have non-skid backing?: yes All stairs or steps have railings?: yes Grab bars in the bathtub or shower?: yes Have non-skid surface in bathtub or shower?: yes Good home lighting?: yes Regular seat belt use?: yes Hospital stays in the last year:: (!) yes How many hospital stays:: 2 Reason: cellulitus and lung nodules  Cognitive Assessment Difficulty concentrating, remembering, or making decisions? : no Will 6CIT or Mini Cog be Completed: yes What year is it?: 0 points What month is it?: 0 points Give patient an address phrase to remember (5 components): [address] [place] About what time is it?: 0 points Count backwards from 20 to 1: 0 points Say the months of the year in reverse: 0 points Repeat the address phrase from earlier: 2 points 6 CIT Score: 2 points  Advance Directives (For Healthcare) Does Patient Have a Medical Advance Directive?: No Would patient like information on creating a medical advance directive?: No - Patient declined  Reviewed/Updated  Reviewed/Updated: Reviewed All (Medical, Surgical, Family, Medications, Allergies, Care Teams, Patient Goals)    Allergies (verified) Feraheme  [ferumoxytol ]   Current Medications (verified) Outpatient Encounter Medications as of 02/19/2024  Medication Sig   acetaminophen  (TYLENOL ) 500 MG tablet Take 1,000 mg by mouth daily.   aspirin   EC 81 MG tablet  Take 81 mg by mouth daily. Swallow whole.   azithromycin  (ZITHROMAX  Z-PAK) 250 MG tablet Take by mouth as directed per package instructions.   benzonatate  (TESSALON ) 100 MG capsule Take 1 capsule (100 mg total) by mouth 3 (three) times daily as needed for cough.   doxycycline  (VIBRA -TABS) 100 MG tablet Take 1 tablet (100 mg total) by mouth 2 (two) times daily.   folic acid  (FOLVITE ) 1 MG tablet Take 1 tablet (1 mg total) by mouth daily. Start 7 days before pemetrexed  chemotherapy. Continue until 21 days after pemetrexed  completed.   furosemide  (LASIX ) 20 MG tablet Take 0.5 tablets (10 mg total) by mouth daily.   glucose blood (TRUE METRIX BLOOD GLUCOSE TEST) test strip Use as instructed to test blood sugar once daily E08.638   guaiFENesin -dextromethorphan  (ROBITUSSIN DM) 100-10 MG/5ML syrup Take 5 mLs by mouth every 6 (six) hours as needed for cough.   ipratropium-albuterol  (DUONEB) 0.5-2.5 (3) MG/3ML SOLN Take 3 mLs by nebulization every 6 (six) hours as needed.   lisinopril  (ZESTRIL ) 20 MG tablet Take 1 tablet (20 mg total) by mouth daily.   loratadine  (CLARITIN ) 10 MG tablet Take 1 tablet (10 mg total) by mouth daily for 7 days.   mupirocin  ointment (BACTROBAN ) 2 % Apply 1 Application topically 2 (two) times daily. Apply with dressing change   ondansetron  (ZOFRAN ) 8 MG tablet Take 1 tablet (8 mg total) by mouth every 8 (eight) hours as needed for nausea or vomiting. Start on the third day after carboplatin.   pravastatin  (PRAVACHOL ) 20 MG tablet Take 1 tablet (20 mg total) by mouth every evening.   sodium chloride  (OCEAN) 0.65 % SOLN nasal spray Place 1 spray into both nostrils as needed for congestion.   No facility-administered encounter medications on file as of 02/19/2024.    History: Past Medical History:  Diagnosis Date   Allergy    seasonal allergies   Anemia    on meds   Aortic atherosclerosis    Bladder cancer (HCC) 2020   Bladder tumor    Blood transfusion without reported  diagnosis    Bloody diarrhea 09/29/2017   Breast cancer (HCC) 2015   Left Breast Cancer   Cataract    bilateral -sx   CKD (chronic kidney disease), stage III (HCC)    DM related   Colon cancer (HCC)    Diabetes mellitus type 2, diet-controlled (HCC)    diet controlled   Dyspnea 09/28/2017   Dysuria    Family history of adverse reaction to anesthesia    aunt- N/V    Family history of breast cancer    Family history of kidney cancer    Family history of throat cancer    Family history of uterine cancer    Fatigue 09/28/2017   Frequency of urination    Grade I diastolic dysfunction 08/06/2014   Noted on ECHO   Gross hematuria 09/29/2017   Heart murmur    since rheumatic fever as child   Hematuria    History of cardiomegaly 02/12/2005   Mild, noted on CXR   History of CVA (cerebrovascular accident) 08/05/2014   post op cerebral angiogram, cerebral thrombosis w/ cerebral infarction;  11-02-2017  per pt no residuals   History of malignant melanoma of skin 12/2015   s/p wide local excision right lower leg (per pt localized)   History of rheumatic fever as a child    Hyperlipidemia    on meds   Hypertension  on meds   Intracranial aneurysm dx 07/ 2015   paraophthalmic RICA aneurysm /   s/p  Pipeline embolization right ICA 01-09-2014   Jaundice 09/28/2017   Malignant neoplasm of central portion of left breast in female, estrogen receptor positive [facility]) oncologist-  dr odean   dx 07/ 2015--- DCIS, ER/PR positive-- s/p  left breast lumpectomy 09-19-2013,  completed radiation 11-12-2013,  started arimidex  11-12-2013   Melanoma (HCC)    righ tleg    OA (osteoarthritis)    knees, hands, R shoulder   Osteopenia 10/16/2012   Diffuse   Personal history of radiation therapy    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   Sigmoid diverticulosis    Stroke (HCC)    Weakness 09/28/2017   Wears dentures    upper   Wears glasses    Wears hearing aid in both ears    Past  Surgical History:  Procedure Laterality Date   BREAST LUMPECTOMY Left 2015   BREAST LUMPECTOMY WITH NEEDLE LOCALIZATION Left 09/19/2013   Procedure: LEFT BREAST LUMPECTOMY WITH DOUBLE WIRE BRACKETED  NEEDLE LOCALIZATION;  Surgeon: Elon CHRISTELLA Pacini, MD;  Location: [facility] OR;  Service: General;  Laterality: Left;   BRONCHIAL BIOPSY  04/06/2021   Procedure: BRONCHIAL BIOPSIES;  Surgeon: Brenna Adine CROME, DO;  Location: [facility];  Service: Pulmonary;;   BRONCHIAL BRUSHINGS  04/06/2021   Procedure: BRONCHIAL BRUSHINGS;  Surgeon: Brenna Adine CROME, DO;  Location: [facility];  Service: Pulmonary;;   CATARACT EXTRACTION W/ INTRAOCULAR LENS  IMPLANT, BILATERAL  06/2015   CESAREAN SECTION  1964   CHOLECYSTECTOMY OPEN  1970s   CYSTOSCOPY W/ URETERAL STENT PLACEMENT Left 11/07/2017   Procedure: CYSTOSCOPY WITH RETROGRADE PYELOGRAM/URETERAL STENT PLACEMENT;  Surgeon: Sherrilee Belvie CROME, MD;  Location[facility];  Service: Urology;  Laterality: Left;   CYSTOSCOPY W/ URETERAL STENT PLACEMENT Bilateral 04/27/2018   Procedure: CYSTOSCOPY WITH RETROGRADE PYELOGRAM;  Surgeon: Sherrilee Belvie CROME, MD;  Location: [facility];  Service: Urology;  Laterality: Bilateral;   FINGER SURGERY     lt thumb  from dog bite   IR  NEPHROURETERAL CATH PLACE LEFT  04/28/2018   MELANOMA EXCISION Right 12/28/2015   Procedure: EXCISION MELANOMA RIGHT LOWER LEG;  Surgeon: Elon Pacini, MD;  Location: [facility];  Service: General;  Laterality: Right;   RADIOLOGY WITH ANESTHESIA N/A 01/09/2014   Procedure: Embolization;  Surgeon: Gerldine Maizes, MD;  Location: [facility] OR;  Service: Radiology;  Laterality: N/A;   SPLENECTOMY, TOTAL  2009 approx.   splen injury due to fall off horse   TONSILLECTOMY  child   TRANSURETHRAL RESECTION OF BLADDER TUMOR N/A 11/07/2017   Procedure: TRANSURETHRAL RESECTION OF BLADDER TUMOR (TURBT), POSSIBLE STENT PLACEMENT;  Surgeon: Sherrilee Belvie CROME, MD;   Location: [facility];  Service: Urology;  Laterality: N/A;   TRANSURETHRAL RESECTION OF BLADDER TUMOR N/A 12/14/2017   Procedure: TRANSURETHRAL RESECTION OF BLADDER TUMOR (TURBT);  Surgeon: Sherrilee Belvie CROME, MD;  Location: [facility];  Service: Urology;  Laterality: N/A;  30 MINS   TRANSURETHRAL RESECTION OF BLADDER TUMOR N/A 04/27/2018   Procedure: TRANSURETHRAL RESECTION OF BLADDER TUMOR (TURBT);  Surgeon: Sherrilee Belvie CROME, MD;  Location: [facility];  Service: Urology;  Laterality: N/A;  30 MINS   VIDEO BRONCHOSCOPY WITH ENDOBRONCHIAL NAVIGATION Bilateral 10/02/2023   Procedure: VIDEO BRONCHOSCOPY WITH ENDOBRONCHIAL NAVIGATION;  Surgeon: Shelah Lamar RAMAN, MD;  Location: [facility] ENDOSCOPY;  Service: Pulmonary;  Laterality: Bilateral;   VIDEO BRONCHOSCOPY WITH RADIAL ENDOBRONCHIAL ULTRASOUND  04/06/2021   Procedure: RADIAL ENDOBRONCHIAL ULTRASOUND;  Surgeon: Brenna Adine CROME, DO;  Location: [facility];  Service: Pulmonary;;   Family History  Problem Relation Age of Onset   Hypertension Mother    Kidney cancer Mother        dx early 74s   Heart attack Paternal Grandfather    Throat cancer Brother 100       hx smoking   Head & neck cancer Son 54       jaw cancer   Breast cancer Maternal Aunt 63   Breast cancer Paternal Aunt 25   Uterine cancer Cousin        dx early 29s (maternal first cousin)   Colon polyps Neg Hx    Colon cancer Neg Hx    Esophageal cancer Neg Hx    Rectal cancer Neg Hx    Stomach cancer Neg Hx    Social History   Occupational History    Comment: Retired  Tobacco Use   Smoking status: Former    Current packs/day: 0.00    Average packs/day: 1.5 packs/day for 40.0 years (60.0 ttl pk-yrs)    Types: Cigarettes    Start date: 03/06/1967    Quit date: 03/06/2007    Years since quitting: 16.9    Passive exposure: Past   Smokeless tobacco: Never  Vaping Use   Vaping status: Never Used  Substance and Sexual Activity    Alcohol use: No   Drug use: Never   Sexual activity: Not on file   Tobacco Counseling Counseling given: Not Answered  SDOH Screenings   Food Insecurity: No Food Insecurity (02/19/2024)  Housing: Unknown (02/19/2024)  Transportation Needs: No Transportation Needs (02/19/2024)  Utilities: Not At Risk (02/19/2024)  Alcohol Screen: Low Risk (02/19/2024)  Depression (PHQ2-9): Low Risk (02/19/2024)  Financial Resource Strain: Low Risk (02/19/2024)  Physical Activity: Inactive (02/19/2024)  Social Connections: Moderately Isolated (02/19/2024)  Stress: No Stress Concern Present (02/19/2024)  Tobacco Use: Medium Risk (02/19/2024)  Health Literacy: Adequate Health Literacy (02/19/2024)   See flowsheets for full screening details  Depression Screen PHQ 2 & 9 Depression Scale- Over the past 2 weeks, how often have you been bothered by any of the following problems? Little interest or pleasure in doing things: 0 Feeling down, depressed, or hopeless (PHQ Adolescent also includes...irritable): 0 PHQ-2 Total Score: 0 Trouble falling or staying asleep, or sleeping too much: 0 Feeling tired or having little energy: 1 Poor appetite or overeating (PHQ Adolescent also includes...weight loss): 1 (not feeling good) Feeling bad about yourself - or that you are a failure or have let yourself or your family down: 0 Trouble concentrating on things, such as reading the newspaper or watching television (PHQ Adolescent also includes...like school work): 0 Moving or speaking so slowly that other people could have noticed. Or the opposite - being so fidgety or restless that you have been moving around a lot more than usual: 0 Thoughts that you would be better off dead, or of hurting yourself in some way: 0 PHQ-9 Total Score: 2 If you checked off any problems, how difficult have these problems made it for you to do your work, take care of things at home, or get along with other people?: Not difficult at all  Depression  Treatment Depression Interventions/Treatment : EYV7-0 Score <4 Follow-up Not Indicated     Goals Addressed  This Visit's Progress    Patient Stated       02/19/2024, get rid of cough             Objective:    Today's Vitals   02/19/24 0806  Weight: 181 lb (82.1 kg)  Height: 5' 4 (1.626 m)   Body mass index is 31.07 kg/m.  Hearing/Vision screen Hearing Screening - Comments:: Has hearing aids that help a little Vision Screening - Comments:: Regular eye exams, Dr. Charmayne Immunizations and Health Maintenance Health Maintenance  Topic Date Due   Zoster Vaccines- Shingrix (1 of 2) Never done   COVID-19 Vaccine (5 - 2025-26 season) 10/09/2023   OPHTHALMOLOGY EXAM  12/15/2023   HEMOGLOBIN A1C  07/04/2024   FOOT EXAM  07/10/2024   Diabetic kidney evaluation - Urine ACR  10/17/2024   Diabetic kidney evaluation - eGFR measurement  02/15/2025   Medicare Annual Wellness (AWV)  02/18/2025   DTaP/Tdap/Td (3 - Td or Tdap) 01/03/2027   Pneumococcal Vaccine: 50+ Years  Completed   Influenza Vaccine  Completed   Bone Density Scan  Completed   Meningococcal B Vaccine  Aged Out   Mammogram  Discontinued   Colonoscopy  Discontinued        Assessment/Plan:  This is a routine wellness examination for Briana Ochoa.  Patient Care Team: Nche, Roselie Rockford, NP as PCP - General (Internal Medicine) Odean Potts, MD as Consulting Physician (Hematology and Oncology) Rosemarie Eather RAMAN, MD as Consulting Physician (Neurology) Lanis Pupa, MD as Consulting Physician (Neurosurgery) Charmayne Molly, MD as Consulting Physician (Ophthalmology) Lanny Callander, MD as Consulting Physician (Hematology and Oncology) Debby Hila, MD as Consulting Physician (General Surgery) Jackquline Dais, Paramedic as Paramedic  I have personally reviewed and noted the following in the patients chart:   Medical and social history Use of alcohol, tobacco or illicit drugs  Current medications and  supplements including opioid prescriptions. Functional ability and status Nutritional status Physical activity Advanced directives List of other physicians Hospitalizations, surgeries, and ER visits in previous 12 months Vitals Screenings to include cognitive, depression, and falls Referrals and appointments  No orders of the defined types were placed in this encounter.  In addition, I have reviewed and discussed with patient certain preventive protocols, quality metrics, and best practice recommendations. A written personalized care plan for preventive services as well as general preventive health recommendations were provided to patient.   Ardella FORBES Dawn, LPN   [id number]   Return in 1 year (on 02/18/2025).  After Visit Summary: (MyChart) Due to this being a telephonic visit, the after visit summary with patients personalized plan was offered to patient via MyChart   Nurse Notes: HM Addressed: Vaccines Due: covid and shingles   "

## 2024-02-19 NOTE — Patient Instructions (Signed)
 Ms. Shams,  Thank you for taking the time for your Medicare Wellness Visit. I appreciate your continued commitment to your health goals. Please review the care plan we discussed, and feel free to reach out if I can assist you further.  Please note that Annual Wellness Visits do not include a physical exam. Some assessments may be limited, especially if the visit was conducted virtually. If needed, we may recommend an in-person follow-up with your provider.  Ongoing Care Seeing your primary care provider every 3 to 6 months helps us  monitor your health and provide consistent, personalized care.   Referrals If a referral was made during today's visit and you haven't received any updates within two weeks, please contact the referred provider directly to check on the status.  Recommended Screenings:  Health Maintenance  Topic Date Due   Zoster (Shingles) Vaccine (1 of 2) Never done   Medicare Annual Wellness Visit  01/02/2018   COVID-19 Vaccine (5 - 2025-26 season) 10/09/2023   Eye exam for diabetics  12/15/2023   Hemoglobin A1C  07/04/2024   Complete foot exam   07/10/2024   Yearly kidney health urinalysis for diabetes  10/17/2024   Yearly kidney function blood test for diabetes  02/15/2025   DTaP/Tdap/Td vaccine (3 - Td or Tdap) 01/03/2027   Pneumococcal Vaccine for age over 26  Completed   Flu Shot  Completed   Osteoporosis screening with Bone Density Scan  Completed   Meningitis B Vaccine  Aged Out   Breast Cancer Screening  Discontinued   Colon Cancer Screening  Discontinued       02/19/2024    8:11 AM  Advanced Directives  Does Patient Have a Medical Advance Directive? No  Would patient like information on creating a medical advance directive? No - Patient declined    Vision: Annual vision screenings are recommended for early detection of glaucoma, cataracts, and diabetic retinopathy. These exams can also reveal signs of chronic conditions such as diabetes and high  blood pressure.  Dental: Annual dental screenings help detect early signs of oral cancer, gum disease, and other conditions linked to overall health, including heart disease and diabetes.  Please see the attached documents for additional preventive care recommendations.

## 2024-02-20 ENCOUNTER — Other Ambulatory Visit: Payer: Self-pay

## 2024-02-20 ENCOUNTER — Other Ambulatory Visit: Payer: Self-pay | Admitting: Nurse Practitioner

## 2024-02-20 ENCOUNTER — Ambulatory Visit: Admitting: Hematology

## 2024-02-20 ENCOUNTER — Inpatient Hospital Stay

## 2024-02-20 ENCOUNTER — Other Ambulatory Visit

## 2024-02-22 ENCOUNTER — Encounter: Payer: Self-pay | Admitting: Hematology

## 2024-02-27 ENCOUNTER — Inpatient Hospital Stay: Attending: Hematology

## 2024-02-27 ENCOUNTER — Inpatient Hospital Stay

## 2024-02-27 ENCOUNTER — Inpatient Hospital Stay: Admitting: Hematology

## 2024-02-27 VITALS — BP 137/82 | HR 88 | Temp 98.1°F | Resp 17 | Wt 180.4 lb

## 2024-02-27 DIAGNOSIS — Z8049 Family history of malignant neoplasm of other genital organs: Secondary | ICD-10-CM | POA: Insufficient documentation

## 2024-02-27 DIAGNOSIS — C7951 Secondary malignant neoplasm of bone: Secondary | ICD-10-CM

## 2024-02-27 DIAGNOSIS — C3412 Malignant neoplasm of upper lobe, left bronchus or lung: Secondary | ICD-10-CM | POA: Diagnosis not present

## 2024-02-27 DIAGNOSIS — Z5112 Encounter for antineoplastic immunotherapy: Secondary | ICD-10-CM | POA: Diagnosis present

## 2024-02-27 DIAGNOSIS — Z803 Family history of malignant neoplasm of breast: Secondary | ICD-10-CM | POA: Diagnosis not present

## 2024-02-27 DIAGNOSIS — Z86000 Personal history of in-situ neoplasm of breast: Secondary | ICD-10-CM | POA: Diagnosis not present

## 2024-02-27 DIAGNOSIS — N183 Chronic kidney disease, stage 3 unspecified: Secondary | ICD-10-CM | POA: Insufficient documentation

## 2024-02-27 DIAGNOSIS — Z800 Family history of malignant neoplasm of digestive organs: Secondary | ICD-10-CM | POA: Diagnosis not present

## 2024-02-27 DIAGNOSIS — Z923 Personal history of irradiation: Secondary | ICD-10-CM | POA: Insufficient documentation

## 2024-02-27 DIAGNOSIS — Z85038 Personal history of other malignant neoplasm of large intestine: Secondary | ICD-10-CM | POA: Insufficient documentation

## 2024-02-27 DIAGNOSIS — Z8051 Family history of malignant neoplasm of kidney: Secondary | ICD-10-CM | POA: Insufficient documentation

## 2024-02-27 DIAGNOSIS — C3490 Malignant neoplasm of unspecified part of unspecified bronchus or lung: Secondary | ICD-10-CM

## 2024-02-27 DIAGNOSIS — C3432 Malignant neoplasm of lower lobe, left bronchus or lung: Secondary | ICD-10-CM

## 2024-02-27 DIAGNOSIS — Z79620 Long term (current) use of immunosuppressive biologic: Secondary | ICD-10-CM | POA: Diagnosis not present

## 2024-02-27 DIAGNOSIS — C186 Malignant neoplasm of descending colon: Secondary | ICD-10-CM

## 2024-02-27 DIAGNOSIS — E538 Deficiency of other specified B group vitamins: Secondary | ICD-10-CM

## 2024-02-27 LAB — CBC WITH DIFFERENTIAL (CANCER CENTER ONLY)
Abs Immature Granulocytes: 0.05 K/uL (ref 0.00–0.07)
Basophils Absolute: 0.1 K/uL (ref 0.0–0.1)
Basophils Relative: 1 %
Eosinophils Absolute: 0.2 K/uL (ref 0.0–0.5)
Eosinophils Relative: 2 %
HCT: 39.4 % (ref 36.0–46.0)
Hemoglobin: 12.8 g/dL (ref 12.0–15.0)
Immature Granulocytes: 0 %
Lymphocytes Relative: 12 %
Lymphs Abs: 1.6 K/uL (ref 0.7–4.0)
MCH: 32.2 pg (ref 26.0–34.0)
MCHC: 32.5 g/dL (ref 30.0–36.0)
MCV: 99.2 fL (ref 80.0–100.0)
Monocytes Absolute: 0.8 K/uL (ref 0.1–1.0)
Monocytes Relative: 6 %
Neutro Abs: 10.6 K/uL — ABNORMAL HIGH (ref 1.7–7.7)
Neutrophils Relative %: 79 %
Platelet Count: 558 K/uL — ABNORMAL HIGH (ref 150–400)
RBC: 3.97 MIL/uL (ref 3.87–5.11)
RDW: 17.9 % — ABNORMAL HIGH (ref 11.5–15.5)
WBC Count: 13.4 K/uL — ABNORMAL HIGH (ref 4.0–10.5)
nRBC: 0 % (ref 0.0–0.2)

## 2024-02-27 LAB — CMP (CANCER CENTER ONLY)
ALT: 10 U/L (ref 0–44)
AST: 19 U/L (ref 15–41)
Albumin: 3.7 g/dL (ref 3.5–5.0)
Alkaline Phosphatase: 119 U/L (ref 38–126)
Anion gap: 12 (ref 5–15)
BUN: 24 mg/dL — ABNORMAL HIGH (ref 8–23)
CO2: 25 mmol/L (ref 22–32)
Calcium: 9.8 mg/dL (ref 8.9–10.3)
Chloride: 103 mmol/L (ref 98–111)
Creatinine: 1.35 mg/dL — ABNORMAL HIGH (ref 0.44–1.00)
GFR, Estimated: 39 mL/min — ABNORMAL LOW
Glucose, Bld: 114 mg/dL — ABNORMAL HIGH (ref 70–99)
Potassium: 4.4 mmol/L (ref 3.5–5.1)
Sodium: 140 mmol/L (ref 135–145)
Total Bilirubin: 0.3 mg/dL (ref 0.0–1.2)
Total Protein: 7.7 g/dL (ref 6.5–8.1)

## 2024-02-27 LAB — VITAMIN B12: Vitamin B-12: 1025 pg/mL — ABNORMAL HIGH (ref 180–914)

## 2024-02-27 LAB — TSH: TSH: 1.92 u[IU]/mL (ref 0.350–4.500)

## 2024-02-27 MED ORDER — SODIUM CHLORIDE 0.9 % IV SOLN
400.0000 mg/m2 | Freq: Once | INTRAVENOUS | Status: AC
  Administered 2024-02-27: 800 mg via INTRAVENOUS
  Filled 2024-02-27: qty 20

## 2024-02-27 MED ORDER — SODIUM CHLORIDE 0.9 % IV SOLN
350.0000 mg | Freq: Once | INTRAVENOUS | Status: AC
  Administered 2024-02-27: 350 mg via INTRAVENOUS
  Filled 2024-02-27: qty 7

## 2024-02-27 MED ORDER — CYANOCOBALAMIN 1000 MCG/ML IJ SOLN
1000.0000 ug | Freq: Once | INTRAMUSCULAR | Status: AC
  Administered 2024-02-27: 1000 ug via INTRAMUSCULAR
  Filled 2024-02-27: qty 1

## 2024-02-27 MED ORDER — PROCHLORPERAZINE MALEATE 10 MG PO TABS
10.0000 mg | ORAL_TABLET | Freq: Once | ORAL | Status: AC
  Administered 2024-02-27: 10 mg via ORAL
  Filled 2024-02-27: qty 1

## 2024-02-27 MED ORDER — SODIUM CHLORIDE 0.9 % IV SOLN: INTRAVENOUS | Status: DC

## 2024-02-27 NOTE — Progress Notes (Unsigned)
 " [facility]   Telephone:([phone]) [phone] Fax:([phone]   Clinic Follow up Note   Patient Care Team: Nche, Roselie Rockford, NP as PCP - General (Internal Medicine) Odean Potts, MD as Consulting Physician (Hematology and Oncology) Rosemarie Eather RAMAN, MD as Consulting Physician (Neurology) Lanis Pupa, MD as Consulting Physician (Neurosurgery) Charmayne Molly, MD as Consulting Physician (Ophthalmology) Lanny Callander, MD as Consulting Physician (Hematology and Oncology) Debby Hila, MD as Consulting Physician (General Surgery) Jackquline Dais, Paramedic as Paramedic  Date of Service:  02/27/2024  CHIEF COMPLAINT: f/u of non-small cell lung cancer  CURRENT THERAPY:  First-line chemotherapy pemetrexed  and Keytruda  every 3 weeks  Oncology History   NSCLC metastatic to bone [facility]) -Diagnosed in 09/2023, stage IV with bone mets, KRAS G12V mutation (+), TPS 10% cps 12%, NGS TEMPUS result pending  -.  During her CT surveillance for her colon cancer in 2022, she was found to have multiple lung nodules.  The left upper lung nodule size slightly increased in 2023, she underwent bronchoscopy biopsy which was negative.  Due to the further increase in the size in 2025, she had a repeated bronchoscopy biopsy in August 2025, which confirmed adenocarcinoma.  The immunostain studies do not support colorectal primary, and a favor lung or upper GI primary.  EGD and colonoscopy were negative for malignancy. -PET scan in September 2025 unfortunately showed a oligo bone metastasis in right scapula.  She was referred to see thoracic surgeon Dr. Kerrin, surgery was not offered due to the metastatic disease. -Guardant360 was negative for targetable mutation -I recommend chemotherapy with single agent pemetrexed , and Keytruda , her  PD-L1 is positive, she started on 01/04/2024.  Cycle 3 chemotherapy was postponed 4 months due to hospital admission for cellulitis.  Assessment & Plan Stage IV  non-small cell lung cancer with bone metastasis She has advanced lung adenocarcinoma with scalp bone metastasis, diagnosed August 2025. She is receiving single-agent chemotherapy and immunotherapy due to age, comorbidities, and recent hernia surgery. She has tolerated therapy well except for a resolved recurrent leg infection. She remains active and independent, with stable hematologic and organ function. The disease is incurable but manageable; therapy aims to control disease and maintain quality of life. Prognosis, potential for progression, and complications were discussed. No actionable mutations are present for targeted therapy. - Continued current chemotherapy and immunotherapy regimen. - Ordered PET scan in 2-3 weeks to assess response to therapy. - Instructed her to monitor for signs of infection or leg swelling/erythema and to notify the office promptly if these occur. - Provided anticipatory guidance regarding disease course, prognosis, and the importance of maintaining activity and a positive outlook. - Discussed potential de-escalation to immunotherapy alone if imaging shows excellent response. - Scheduled follow-up in three months.  Chronic kidney disease She has mild chronic kidney disease with stable renal function. Imaging contrast is avoided due to renal impairment. - Monitored renal function with laboratory testing. - Advised adequate oral hydration with plain water. - Avoided contrast in imaging studies.  Plan - She is clinically doing well, left lower extremity cellulitis has resolved completely. - Lab reviewed, adequate for treatment, will proceed to chemotherapy today and continue every 3 weeks - Restaging PET scan in 2-3 weeks -f/u in 3 weeks    SUMMARY OF ONCOLOGIC HISTORY: Oncology History Overview Note  Cancer Staging Cancer of left colon [facility]) Staging form: Colon and Rectum, AJCC 8th Edition - Pathologic stage from 05/28/2020: Stage IIA (pT3, pN0, cM0) - Signed by  Lanny Callander,  MD on 06/19/2020 Stage prefix: Initial diagnosis Total positive nodes: 0 Histologic grading system: 4 grade system Histologic grade (G): G3 Residual tumor (R): R0 - None  Ductal carcinoma in situ (DCIS) of left breast Staging form: Breast, AJCC 7th Edition - Clinical: Stage 0 (Tis (DCIS), N0, cM0) - Unsigned Specimen type: Core Needle Biopsy Histopathologic type: 9932 Laterality: Left Staging comments: Staged at breast conference 7.22.15  - Pathologic: Stage 0 (Tis (DCIS)(2), N0, cM0) - Unsigned Specimen type: Core Needle Biopsy Histopathologic type: 9932 Laterality: Left Tumor size (mm): 3 Multiple tumors: Yes Number of tumors: 2 Method of lymph node assessment: Clinical Method of detection of distant metastases: Clinical Residual tumor (R): R0 - None Estrogen receptor status: Positive Progesterone receptor status: Positive    Ductal carcinoma in situ (DCIS) of left breast  08/20/2013 Initial Diagnosis   Left breast 12:00: DCIS with calcifications, yet 100%, PR 100%; 09/04/2013 second group of calcifications also biopsied proven to be DCIS ER/PR positive   09/19/2013 Surgery   Left breast lumpectomy DCIS 2 foci margins negative, 0.2 and 0.3 cm ER 100% PR 100%   10/08/2013 - 11/12/2013 Radiation Therapy   Adjuvant radiation therapy   11/12/2013 - 2020 Anti-estrogen oral therapy   Anastrozole  1 mg daily plan is for 5 years   01/09/2014 - 01/10/2014 Hospital Admission   Pipeline embolization 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 The report date is 07/30/2020.   The CancerNext-Expanded + RNAinsight gene panel offered by [employer] and includes sequencing and rearrangement analysis for the following 77 genes: AIP, ALK, APC, ATM, AXIN2, BAP1, BARD1, BLM, BMPR1A, BRCA1, BRCA2, BRIP1, CDC73, CDH1, CDK4, CDKN1B, CDKN2A, CHEK2, CTNNA1, DICER1,  FANCC, FH, FLCN, GALNT12, KIF1B, LZTR1, MAX, MEN1, MET, MLH1, MSH2, MSH3, MSH6, MUTYH, NBN, NF1, NF2, NTHL1, PALB2, PHOX2B, PMS2, POT1, PRKAR1A, PTCH1, PTEN, RAD51C, RAD51D, RB1, RECQL, RET, SDHA, SDHAF2, SDHB, SDHC, SDHD, SMAD4, SMARCA4, SMARCB1, SMARCE1, STK11, SUFU, TMEM127, TP53, TSC1, TSC2, VHL and XRCC2 (sequencing and deletion/duplication); EGFR, EGLN1, HOXB13, KIT, MITF, PDGFRA, POLD1 and POLE (sequencing only); EPCAM and GREM1 (deletion/duplication only). RNA data is routinely analyzed for use in variant interpretation for all genes.   Bladder cancer (HCC)  02/09/2018 Initial Diagnosis   Bladder cancer (HCC)   07/30/2020 Genetic Testing   Negative genetic testing:  No pathogenic variants detected on the [employer] CancerNext-Expanded + RNAinsight panel. The report date is 07/30/2020.   The CancerNext-Expanded + RNAinsight gene panel offered by [employer] and includes sequencing and rearrangement analysis for the following 77 genes: AIP, ALK, APC, ATM, AXIN2, BAP1, BARD1, BLM, BMPR1A, BRCA1, BRCA2, BRIP1, CDC73, CDH1, CDK4, CDKN1B, CDKN2A, CHEK2, CTNNA1, DICER1, FANCC, FH, FLCN, GALNT12, KIF1B, LZTR1, MAX, MEN1, MET, MLH1, MSH2, MSH3, MSH6, MUTYH, NBN, NF1, NF2, NTHL1, PALB2, PHOX2B, PMS2, POT1, PRKAR1A, PTCH1, PTEN, RAD51C, RAD51D, RB1, RECQL, RET, SDHA, SDHAF2, SDHB, SDHC, SDHD, SMAD4, SMARCA4, SMARCB1, SMARCE1, STK11, SUFU, TMEM127, TP53, TSC1, TSC2, VHL and XRCC2 (sequencing and deletion/duplication); EGFR, EGLN1, HOXB13, KIT, MITF, PDGFRA, POLD1 and POLE (sequencing only); EPCAM and GREM1 (deletion/duplication only). RNA data is routinely analyzed for use in variant interpretation for all genes.   08/31/2020 Imaging   CT CAP w/o contrast  IMPRESSION: 1. No specific findings of active malignancy on today's noncontrast CT. There has been interval partial colectomy involving the tumor site with reanastomosis. Some faint stranding in the adjacent omentum is probably from scarring or fat  necrosis, less likely due to early tumor given the lack of overt nodularity. Surveillance imaging is likely indicated, and PET-CT may have a role in imaging follow up. 2. Several small pulmonary nodules, in addition to a larger mixed density nodule in the left upper lobe. Surveillance is recommended. 3. Other imaging findings of potential clinical significance: Aortic Atherosclerosis (ICD10-I70.0). Coronary atherosclerosis. Mitral valve calcification. Small type 1 hiatal hernia. Splenectomy with small amount of regenerative splenic tissue as well as a venous varix arising from the splenic vein. Hyperdense left kidney lower pole exophytic homogeneous lesion, probably a complex cyst. Sigmoid colon diverticulosis. Multilevel lumbar spondylosis and degenerative disc disease.   Cancer of left colon (HCC)  04/23/2020 Procedure   Upper Endoscopy and Colonoscopy by Dr Avram IMPRESSION Erythematous mucosa in the antrum. Biopsied. - Small hiatal hernia. - Gastroesophageal flap valve classified as Hill Grade IV (no fold, wide open lumen, hiatal hernia present). - The examination was otherwise normal.   IMPRESSION - Decreased sphincter tone found on digital rectal exam. - Malignant partially obstructing tumor in the proximal descending colon. Biopsied. Tattooed. - Three 5 to 20 mm polyps in the sigmoid colon, removed piecemeal using a cold snare. Resected and retrieved. - Severe diverticulosis in the sigmoid colon. - I left several diminutive descending (proximal to tattoo), sigmoid and rectal polyps - The examination was otherwise normal on direct and retroflexion views.     04/23/2020 Initial Diagnosis   Diagnosis 1. Descending Colon Polyp, mass - ADENOCARCINOMA. 2. Sigmoid Colon Polyp, (3) - MULTIPLE FRAGMENTS OF TUBULAR ADENOMA. - MULTIPLE FRAGMENTS OF SESSILE SERRATED POLYP WITHOUT DYSPLASIA. - NO HIGH GRADE DYSPLASIA OR MALIGNANCY. Microscopic Comment 1. Dr. Rebbecca has  reviewed the case. Dr. Avram was notified on 04/24/2020. 2. Despite being left sided the fragments have features of a sessile serrated polyp, as opposed to, hyperplastic polyp.   04/27/2020 Imaging   CT AP IMPRESSION: Bulky annular soft tissue mass involving the distal transverse colon with extension into adjacent pericolonic fat, consistent with primary colon carcinoma.   No evidence of metastatic disease.   Colonic diverticulosis. No radiographic evidence of diverticulitis.   Aortic Atherosclerosis (ICD10-I70.0).     05/05/2020 Imaging   CT Chest IMPRESSION: Bulky annular soft tissue mass involving the distal transverse colon with extension into adjacent pericolonic fat, consistent with primary colon carcinoma.   No evidence of metastatic disease.   Colonic diverticulosis. No radiographic evidence of diverticulitis.   Aortic Atherosclerosis (ICD10-I70.0).     05/28/2020 Initial Diagnosis   Cancer of left colon (HCC)   05/28/2020 Cancer Staging   Staging form: Colon and Rectum, AJCC 8th Edition - Pathologic stage from 05/28/2020: Stage IIA (pT3, pN0, cM0) - Signed by Lanny Callander, MD on 06/19/2020 Stage prefix: Initial diagnosis Total positive nodes: 0 Histologic grading system: 4 grade system Histologic grade (G): G3 Residual tumor (R): R0 - None   05/28/2020 Surgery   XI ROBOT ASSISTED RESECTION OF SPLENIC FLEXURE by Dr Debby    FINAL MICROSCOPIC DIAGNOSIS:   A. COLON, SPLENIC FLEXURE, RESECTION:  - Invasive adenocarcinoma, poorly differentiated, spanning 9.7 cm.  - Tumor invades through muscularis propria into pericolonic tissue.  - Resection margins are negative.  - Tubular adenoma (X1).  - Sessile serrate polyp without dysplasia (x2).  - Diverticulosis.  - Polypectomy scar.  - Twenty-four of twenty-four lymph nodes negative for carcinoma (0/24).  - See oncology table.    07/07/2020 Miscellaneous   Guardant Reveal ctDNA positive MSI high   07/30/2020 Genetic  Testing   Negative genetic testing:  No pathogenic variants detected on the [employer] CancerNext-Expanded + RNAinsight panel. The report date is 07/30/2020.   The CancerNext-Expanded + RNAinsight gene panel offered by [employer] and includes sequencing and rearrangement analysis for the following 77 genes: AIP, ALK, APC, ATM, AXIN2, BAP1, BARD1, BLM, BMPR1A, BRCA1, BRCA2, BRIP1, CDC73, CDH1, CDK4, CDKN1B, CDKN2A, CHEK2, CTNNA1, DICER1, FANCC, FH, FLCN, GALNT12, KIF1B, LZTR1, MAX, MEN1, MET, MLH1, MSH2, MSH3, MSH6, MUTYH, NBN, NF1, NF2, NTHL1, PALB2, PHOX2B, PMS2, POT1, PRKAR1A, PTCH1, PTEN, RAD51C, RAD51D, RB1, RECQL, RET, SDHA, SDHAF2, SDHB, SDHC, SDHD, SMAD4, SMARCA4, SMARCB1, SMARCE1, STK11, SUFU, TMEM127, TP53, TSC1, TSC2, VHL and XRCC2 (sequencing and deletion/duplication); EGFR, EGLN1, HOXB13, KIT, MITF, PDGFRA, POLD1 and POLE (sequencing only); EPCAM and GREM1 (deletion/duplication only). RNA data is routinely analyzed for use in variant interpretation for all genes.   08/31/2020 Imaging   CT CAP w/o contrast  IMPRESSION: 1. No specific findings of active malignancy on today's noncontrast CT. There has been interval partial colectomy involving the tumor site with reanastomosis. Some faint stranding in the adjacent omentum is probably from scarring or fat necrosis, less likely due to early tumor given the lack of overt nodularity. Surveillance imaging is likely indicated, and PET-CT may have a role in imaging follow up. 2. Several small pulmonary nodules, in addition to a larger mixed density nodule in the left upper lobe. Surveillance is recommended. 3. Other imaging findings of potential clinical significance: Aortic Atherosclerosis (ICD10-I70.0). Coronary atherosclerosis. Mitral valve calcification. Small type 1 hiatal hernia. Splenectomy with small amount of regenerative splenic tissue as well as a venous varix arising from the splenic vein. Hyperdense left kidney lower pole exophytic  homogeneous lesion, probably a complex cyst. Sigmoid colon diverticulosis. Multilevel lumbar spondylosis and degenerative disc disease.   10/02/2020 - 09/17/2021 Chemotherapy   Patient is on Treatment Plan : COLORECTAL Pembrolizumab  q21d     03/01/2021 Imaging   EXAM: CT CHEST, ABDOMEN AND PELVIS WITHOUT CONTRAST  IMPRESSION: 1. Mixed attenuation lesion posterior left upper lobe has areas of more confluent and enlarging nodularity today. Interval progression raises concern for neoplasm. Consider PET-CT to further evaluate. 2. Stable 7 mm right lower lobe pulmonary nodule with a slightly more conspicuous 3 mm nodule in the right lower lobe. Continued close attention on follow-up recommended. 3. No evidence for metastatic disease in the abdomen or pelvis. 4. Aortic Atherosclerosis (ICD10-I70.0).   03/17/2021 PET scan   IMPRESSION: 1. Part solid nodule of the left upper lobe demonstrates mild FDG uptake, and has increased in size when compared with prior exam dated May 05, 2020. Concerning for indolent primary lung neoplasm. 2. No evidence of metastatic disease in the chest, abdomen or pelvis. 3. 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 Recommend further evaluation with tissue sampling. 4. Aortic Atherosclerosis (ICD10-I70.0).   03/15/2022 Imaging    IMPRESSION: Stable irregular solid pulmonary nodule in posterior left upper lobe.   No evidence of recurrent or metastatic disease within the abdomen or pelvis.   Colonic diverticulosis, without radiographic evidence of diverticulitis.   NSCLC metastatic to bone [facility])  10/02/2023 Cancer Staging   Staging form: Lung, AJCC V9 - Clinical stage from 10/02/2023: Richelle Canary, cM1 - Signed by Lanny Callander, MD on 11/20/2023   11/20/2023 Initial Diagnosis   NSCLC metastatic to bone (HCC)   12/04/2023 -  Chemotherapy   Patient is on Treatment Plan : Lung Pemetrexed  (500) + Pembrolizumab   (200) D1 q21d  Discussed the use of AI scribe software for clinical note transcription with the patient, who gave verbal consent to proceed.  History of Present Illness IKIA CINCOTTA is an 84 year old female with stage IV non-small cell lung adenocarcinoma who presents for follow-up of her lung cancer treatment after a recent infectious complication.  She was diagnosed with stage IV lung adenocarcinoma in August 2025 by biopsy, with PET showing metastasis to the scalp bone. She has received single-agent chemotherapy and immunotherapy.  She has tolerated systemic therapy well overall. Her main complication has been recurrent leg infections and a recent respiratory infection, which led to holding her most recent chemotherapy cycle. Both infections have resolved. She denies current leg pain, discomfort, or swelling and has no ongoing infectious symptoms.  She has prior breast, bladder, and colon cancers, all treated. She has stable chronic kidney disease with recent labs showing stable renal, hepatic, and hematologic function. Thyroid  function on immunotherapy has been normal. She has not had symptoms of anemia or other cytopenias.     All other systems were reviewed with the patient and are negative.  MEDICAL HISTORY:  Past Medical History:  Diagnosis Date   Allergy    seasonal allergies   Anemia    on meds   Aortic atherosclerosis    Bladder cancer (HCC) 2020   Bladder tumor    Blood transfusion without reported diagnosis    Bloody diarrhea 09/29/2017   Breast cancer (HCC) 2015   Left Breast Cancer   Cataract    bilateral -sx   CKD (chronic kidney disease), stage III (HCC)    DM related   Colon cancer (HCC)    Diabetes mellitus type 2, diet-controlled (HCC)    diet controlled   Dyspnea 09/28/2017   Dysuria    Family history of adverse reaction to anesthesia    aunt- N/V    Family history of breast cancer    Family history of kidney cancer    Family history of  throat cancer    Family history of uterine cancer    Fatigue 09/28/2017   Frequency of urination    Grade I diastolic dysfunction 08/06/2014   Noted on ECHO   Gross hematuria 09/29/2017   Heart murmur    since rheumatic fever as child   Hematuria    History of cardiomegaly 02/12/2005   Mild, noted on CXR   History of CVA (cerebrovascular accident) 08/05/2014   post op cerebral angiogram, cerebral thrombosis w/ cerebral infarction;  11-02-2017  per pt no residuals   History of malignant melanoma of skin 12/2015   s/p wide local excision right lower leg (per pt localized)   History of rheumatic fever as a child    Hyperlipidemia    on meds   Hypertension    on meds   Intracranial aneurysm dx 07/ 2015   paraophthalmic RICA aneurysm /   s/p  Pipeline embolization right ICA 01-09-2014   Jaundice 09/28/2017   Malignant neoplasm of central portion of left breast in female, estrogen receptor positive [facility]) oncologist-  dr odean   dx 07/ 2015--- DCIS, ER/PR positive-- s/p  left breast lumpectomy 09-19-2013,  completed radiation 11-12-2013,  started arimidex  11-12-2013   Melanoma (HCC)    righ tleg    OA (osteoarthritis)    knees, hands, R shoulder   Osteopenia 10/16/2012   Diffuse   Personal history of radiation therapy    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   Sigmoid diverticulosis  Stroke [facility])    Weakness 09/28/2017   Wears dentures    upper   Wears glasses    Wears hearing aid in both ears     SURGICAL HISTORY: Past Surgical History:  Procedure Laterality Date   BREAST LUMPECTOMY Left 2015   BREAST LUMPECTOMY WITH NEEDLE LOCALIZATION Left 09/19/2013   Procedure: LEFT BREAST LUMPECTOMY WITH DOUBLE WIRE BRACKETED  NEEDLE LOCALIZATION;  Surgeon: Elon CHRISTELLA Pacini, MD;  Location: [facility] OR;  Service: General;  Laterality: Left;   BRONCHIAL BIOPSY  04/06/2021   Procedure: BRONCHIAL BIOPSIES;  Surgeon: Brenna Adine CROME, DO;  Location: [facility] ENDOSCOPY;  Service: Pulmonary;;    BRONCHIAL BRUSHINGS  04/06/2021   Procedure: BRONCHIAL BRUSHINGS;  Surgeon: Brenna Adine CROME, DO;  Location: [facility] ENDOSCOPY;  Service: Pulmonary;;   CATARACT EXTRACTION W/ INTRAOCULAR LENS  IMPLANT, BILATERAL  06/2015   CESAREAN SECTION  1964   CHOLECYSTECTOMY OPEN  1970s   CYSTOSCOPY W/ URETERAL STENT PLACEMENT Left 11/07/2017   Procedure: CYSTOSCOPY WITH RETROGRADE PYELOGRAM/URETERAL STENT PLACEMENT;  Surgeon: Sherrilee Belvie CROME, MD;  Location: [facility];  Service: Urology;  Laterality: Left;   CYSTOSCOPY W/ URETERAL STENT PLACEMENT Bilateral 04/27/2018   Procedure: CYSTOSCOPY WITH RETROGRADE PYELOGRAM;  Surgeon: Sherrilee Belvie CROME, MD;  Location: [facility];  Service: Urology;  Laterality: Bilateral;   FINGER SURGERY     lt thumb  from dog bite   IR  NEPHROURETERAL CATH PLACE LEFT  04/28/2018   MELANOMA EXCISION Right 12/28/2015   Procedure: EXCISION MELANOMA RIGHT LOWER LEG;  Surgeon: Elon Pacini, MD;  Location: [facility];  Service: General;  Laterality: Right;   RADIOLOGY WITH ANESTHESIA N/A 01/09/2014   Procedure: Embolization;  Surgeon: Gerldine Maizes, MD;  Location: [facility] OR;  Service: Radiology;  Laterality: N/A;   SPLENECTOMY, TOTAL  2009 approx.   splen injury due to fall off horse   TONSILLECTOMY  child   TRANSURETHRAL RESECTION OF BLADDER TUMOR N/A 11/07/2017   Procedure: TRANSURETHRAL RESECTION OF BLADDER TUMOR (TURBT), POSSIBLE STENT PLACEMENT;  Surgeon: Sherrilee Belvie CROME, MD;  Location: [facility];  Service: Urology;  Laterality: N/A;   TRANSURETHRAL RESECTION OF BLADDER TUMOR N/A 12/14/2017   Procedure: TRANSURETHRAL RESECTION OF BLADDER TUMOR (TURBT);  Surgeon: Sherrilee Belvie CROME, MD;  Location: [facility];  Service: Urology;  Laterality: N/A;  30 MINS   TRANSURETHRAL RESECTION OF BLADDER TUMOR N/A 04/27/2018   Procedure: TRANSURETHRAL RESECTION OF BLADDER TUMOR (TURBT);  Surgeon: Sherrilee Belvie CROME, MD;  Location: [facility];  Service: Urology;  Laterality: N/A;  30 MINS   VIDEO BRONCHOSCOPY WITH ENDOBRONCHIAL NAVIGATION Bilateral 10/02/2023   Procedure: VIDEO BRONCHOSCOPY WITH ENDOBRONCHIAL NAVIGATION;  Surgeon: Shelah Lamar RAMAN, MD;  Location: [facility] ENDOSCOPY;  Service: Pulmonary;  Laterality: Bilateral;   VIDEO BRONCHOSCOPY WITH RADIAL ENDOBRONCHIAL ULTRASOUND  04/06/2021   Procedure: RADIAL ENDOBRONCHIAL ULTRASOUND;  Surgeon: Brenna Adine CROME, DO;  Location: [facility];  Service: Pulmonary;;    I have reviewed the social history and family history with the patient and they are unchanged from previous note.  ALLERGIES:  is allergic to feraheme  [ferumoxytol ].  MEDICATIONS:  Current Outpatient Medications  Medication Sig Dispense Refill   acetaminophen  (TYLENOL ) 500 MG tablet Take 1,000 mg by mouth daily.     aspirin  EC 81 MG tablet Take 81 mg by mouth daily. Swallow whole.     azithromycin  (ZITHROMAX  Z-PAK) 250 MG tablet Take by mouth as directed per package instructions. 6  each 0   benzonatate  (TESSALON ) 100 MG capsule Take 1 capsule (100 mg total) by mouth 3 (three) times daily as needed for cough. 30 capsule 0   doxycycline  (VIBRA -TABS) 100 MG tablet Take 1 tablet (100 mg total) by mouth 2 (two) times daily. 20 tablet 0   folic acid  (FOLVITE ) 1 MG tablet Take 1 tablet (1 mg total) by mouth daily. Start 7 days before pemetrexed  chemotherapy. Continue until 21 days after pemetrexed  completed. 100 tablet 3   furosemide  (LASIX ) 20 MG tablet Take 0.5 tablets (10 mg total) by mouth daily. 2 tablet 0   glucose blood (TRUE METRIX BLOOD GLUCOSE TEST) test strip Use as instructed to test blood sugar once daily E08.638 100 each 2   guaiFENesin -dextromethorphan  (ROBITUSSIN DM) 100-10 MG/5ML syrup Take 5 mLs by mouth every 6 (six) hours as needed for cough. 118 mL 0   ipratropium-albuterol  (DUONEB) 0.5-2.5 (3) MG/3ML SOLN Take 3 mLs by nebulization every 6 (six) hours as  needed. 360 mL 1   lisinopril  (ZESTRIL ) 20 MG tablet Take 1 tablet (20 mg total) by mouth daily. 90 tablet 3   loratadine  (CLARITIN ) 10 MG tablet Take 1 tablet (10 mg total) by mouth daily for 7 days. 7 tablet 0   mupirocin  ointment (BACTROBAN ) 2 % Apply 1 Application topically 2 (two) times daily. Apply with dressing change 15 g 0   ondansetron  (ZOFRAN ) 8 MG tablet Take 1 tablet (8 mg total) by mouth every 8 (eight) hours as needed for nausea or vomiting. Start on the third day after carboplatin. 30 tablet 1   pravastatin  (PRAVACHOL ) 20 MG tablet Take 1 tablet (20 mg total) by mouth every evening. 90 tablet 1   sodium chloride  (OCEAN) 0.65 % SOLN nasal spray Place 1 spray into both nostrils as needed for congestion. 50 mL 0   No current facility-administered medications for this visit.   Facility-Administered Medications Ordered in Other Visits  Medication Dose Route Frequency Provider Last Rate Last Admin   0.9 %  sodium chloride  infusion   Intravenous Continuous Lanny Callander, MD   Stopped at 02/27/24 1651    PHYSICAL EXAMINATION: ECOG PERFORMANCE STATUS: 1 - Symptomatic but completely ambulatory  Vitals:   02/27/24 1425  BP: 137/82  Pulse: 88  Resp: 17  Temp: 98.1 F (36.7 C)  SpO2: 99%   Wt Readings from Last 3 Encounters:  02/27/24 180 lb 6.4 oz (81.8 kg)  02/19/24 181 lb (82.1 kg)  02/16/24 181 lb 12.8 oz (82.5 kg)     GENERAL:alert, no distress and comfortable SKIN: skin color, texture, turgor are normal, no rashes or significant lesions, skin hyperpigmentation in the left lower extremity, no rash or warmness. EYES: normal, Conjunctiva are pink and non-injected, sclera clear NECK: supple, thyroid  normal size, non-tender, without nodularity LYMPH:  no palpable lymphadenopathy in the cervical, axillary  LUNGS: clear to auscultation and percussion with normal breathing effort HEART: regular rate & rhythm and no murmurs and no lower extremity edema ABDOMEN:abdomen soft,  non-tender and normal bowel sounds Musculoskeletal:no cyanosis of digits and no clubbing  NEURO: alert & oriented x 3 with fluent speech, no focal motor/sensory deficits  Physical Exam    LABORATORY DATA:  I have reviewed the data as listed    Latest Ref Rng & Units 02/27/2024    1:30 PM 02/16/2024    4:12 PM 01/15/2024   11:28 AM  CBC  WBC 4.0 - 10.5 K/uL 13.4  13.3  10.2   Hemoglobin 12.0 -  15.0 g/dL 87.1  88.2  88.5   Hematocrit 36.0 - 46.0 % 39.4  34.9  34.3   Platelets 150 - 400 K/uL 558  348  637         Latest Ref Rng & Units 02/27/2024    1:30 PM 02/16/2024    4:12 PM 01/15/2024   11:28 AM  CMP  Glucose 70 - 99 mg/dL 885  882  876   BUN 8 - 23 mg/dL 24  23  24    Creatinine 0.44 - 1.00 mg/dL 8.64  8.62  8.34   Sodium 135 - 145 mmol/L 140  137  137   Potassium 3.5 - 5.1 mmol/L 4.4  4.8  5.0   Chloride 98 - 111 mmol/L 103  103  103   CO2 22 - 32 mmol/L 25  25  23    Calcium  8.9 - 10.3 mg/dL 9.8  9.2  9.3   Total Protein 6.5 - 8.1 g/dL 7.7  6.5  6.9   Total Bilirubin 0.0 - 1.2 mg/dL 0.3  0.5  0.3   Alkaline Phos 38 - 126 U/L 119   122   AST 15 - 41 U/L 19  18  33   ALT 0 - 44 U/L 10  9  18        RADIOGRAPHIC STUDIES: I have personally reviewed the radiological images as listed and agreed with the findings in the report. No results found.    Orders Placed This Encounter  Procedures   NM PET Image Restag (PS) Skull Base To Thigh    Standing Status:   Future    Expected Date:   03/12/2024    Expiration Date:   02/26/2025    If indicated for the ordered procedure, I authorize the administration of a radiopharmaceutical per Radiology protocol:   Yes    Preferred imaging location?:   [facility]   All questions were answered. The patient knows to call the clinic with any problems, questions or concerns. No barriers to learning was detected. The total time spent in the appointment was 30 minutes, including review of chart and various tests results, discussions about plan  of care and coordination of care plan     Onita Mattock, MD 02/27/2024     "

## 2024-02-27 NOTE — Assessment & Plan Note (Addendum)
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  During her CT surveillance for her colon cancer in 2022, she was found to have multiple lung nodules.  The left upper lung nodule size slightly increased in 2023, she underwent bronchoscopy biopsy which was negative.  Due to the further increase in the size in 2025, she had a repeated bronchoscopy biopsy in August 2025, which confirmed adenocarcinoma.  The immunostain studies do not support colorectal primary, and a favor lung or upper GI primary.  EGD and colonoscopy were negative for malignancy. -PET scan in September 2025 unfortunately showed a oligo bone metastasis in right scapula.  She was referred to see thoracic surgeon Dr. Kerrin, surgery was not offered due to the metastatic disease. -Guardant360 was negative for targetable mutation -I recommend chemotherapy with single agent pemetrexed , and Keytruda , her  PD-L1 is positive, she started on 01/04/2024.  Cycle 3 chemotherapy was postponed 4 months due to hospital admission for cellulitis.

## 2024-02-27 NOTE — Patient Instructions (Signed)
 [facility] - A DEPT OF [facility]  Discharge Instructions:  Doppler Scan tomorrow- [facility] across from [facility] the address is [address] [place], [place] [phone]   Thank you for choosing [facility] to provide your oncology and hematology care.   If you have a lab appointment with the Cancer Center, please go directly to the Cancer Center and check in at the registration area.   Wear comfortable clothing and clothing appropriate for easy access to any Portacath or PICC line.   We strive to give you quality time with your provider. You may need to reschedule your appointment if you arrive late (15 or more minutes).  Arriving late affects you and other patients whose appointments are after yours.  Also, if you miss three or more appointments without notifying the office, you may be dismissed from the clinic at the provider's discretion.      For prescription refill requests, have your pharmacy contact our office and allow 72 hours for refills to be completed.    Today you received the following chemotherapy and/or immunotherapy agents alimta and libtayo      To help prevent nausea and vomiting after your treatment, we encourage you to take your nausea medication as directed.  BELOW ARE SYMPTOMS THAT SHOULD BE REPORTED IMMEDIATELY: *FEVER GREATER THAN 100.4 F (38 C) OR HIGHER *CHILLS OR SWEATING *NAUSEA AND VOMITING THAT IS NOT CONTROLLED WITH YOUR NAUSEA MEDICATION *UNUSUAL SHORTNESS OF BREATH *UNUSUAL BRUISING OR BLEEDING *URINARY PROBLEMS (pain or burning when urinating, or frequent urination) *BOWEL PROBLEMS (unusual diarrhea, constipation, pain near the anus) TENDERNESS IN MOUTH AND THROAT WITH OR WITHOUT PRESENCE OF ULCERS (sore throat, sores in mouth, or a toothache) UNUSUAL RASH, SWELLING OR PAIN  UNUSUAL VAGINAL DISCHARGE OR ITCHING   Items with * indicate a potential emergency and should be  followed up as soon as possible or go to the Emergency Department if any problems should occur.  Please show the CHEMOTHERAPY ALERT CARD or IMMUNOTHERAPY ALERT CARD at check-in to the Emergency Department and triage nurse.  Should you have questions after your visit or need to cancel or reschedule your appointment, please contact [facility] - A DEPT OF [facility]  Dept: [phone]  and follow the prompts.  Office hours are 8:00 a.m. to 4:30 p.m. Monday - Friday. Please note that voicemails left after 4:00 p.m. may not be returned until the following business day.  We are closed weekends and major holidays. You have access to a nurse at all times for urgent questions. Please call the main number to the clinic Dept: [phone] and follow the prompts.   For any non-urgent questions, you may also contact your provider using MyChart. We now offer e-Visits for anyone 25 and older to request care online for non-urgent symptoms. For details visit [email or web address].   Also download the MyChart app! Go to the app store, search MyChart, open the app, select [place], and log in with your MyChart username and password.

## 2024-02-28 ENCOUNTER — Encounter: Payer: Self-pay | Admitting: Hematology

## 2024-02-28 ENCOUNTER — Ambulatory Visit: Admitting: Nurse Practitioner

## 2024-02-28 LAB — T4: T4, Total: 6.8 ug/dL (ref 4.5–12.0)

## 2024-03-05 ENCOUNTER — Inpatient Hospital Stay: Admitting: Hematology

## 2024-03-05 ENCOUNTER — Inpatient Hospital Stay

## 2024-03-07 ENCOUNTER — Telehealth (HOSPITAL_COMMUNITY): Payer: Self-pay | Admitting: Nurse Practitioner

## 2024-03-07 NOTE — Telephone Encounter (Signed)
 Patient called and cancelled the scheduled echocardiogram. Patient states she is still surrounded by ice and snow. She also states that she is no longer having issues and doesn't think she needs the test anymore. Order will be removed from the echo WQ.

## 2024-03-08 ENCOUNTER — Ambulatory Visit (HOSPITAL_COMMUNITY)

## 2024-03-08 NOTE — Telephone Encounter (Signed)
 Called patient and informed her of Charlotte's comment(s). Patient stated that she has had the murmur since she was a baby. I informed her that per Roselie she recommends that she have it performed once the weather improves. She stated okay and hung up the phone

## 2024-03-12 ENCOUNTER — Encounter (HOSPITAL_COMMUNITY): Admission: RE | Admit: 2024-03-12 | Source: Ambulatory Visit

## 2024-03-14 ENCOUNTER — Encounter (HOSPITAL_COMMUNITY)

## 2024-03-19 ENCOUNTER — Inpatient Hospital Stay: Attending: Hematology

## 2024-03-19 ENCOUNTER — Inpatient Hospital Stay

## 2024-03-19 ENCOUNTER — Inpatient Hospital Stay: Admitting: Hematology

## 2024-03-22 ENCOUNTER — Encounter (HOSPITAL_COMMUNITY)

## 2024-03-25 ENCOUNTER — Inpatient Hospital Stay: Admitting: Hematology

## 2024-03-25 ENCOUNTER — Inpatient Hospital Stay

## 2024-04-09 ENCOUNTER — Inpatient Hospital Stay

## 2024-04-09 ENCOUNTER — Inpatient Hospital Stay: Admitting: Hematology

## 2024-04-15 ENCOUNTER — Inpatient Hospital Stay

## 2024-04-15 ENCOUNTER — Inpatient Hospital Stay: Admitting: Hematology

## 2024-04-30 ENCOUNTER — Inpatient Hospital Stay: Admitting: Hematology

## 2024-04-30 ENCOUNTER — Inpatient Hospital Stay

## 2024-07-31 ENCOUNTER — Other Ambulatory Visit: Admitting: Urology

## 2025-02-24 ENCOUNTER — Ambulatory Visit
